# Patient Record
Sex: Male | Born: 1950
Health system: Southern US, Community
[De-identification: ages and names within clinical notes are randomized; demographics above are authoritative.]

## PROBLEM LIST (undated history)

## (undated) DIAGNOSIS — J309 Allergic rhinitis, unspecified: Secondary | ICD-10-CM

## (undated) DIAGNOSIS — C61 Malignant neoplasm of prostate: Secondary | ICD-10-CM

## (undated) HISTORY — DX: Allergic rhinitis, unspecified: J30.9

## (undated) HISTORY — PX: SHOULDER SURGERY: SHX246

## (undated) HISTORY — PX: PROSTATE BIOPSY: SHX241

---

## 2006-05-13 ENCOUNTER — Ambulatory Visit (HOSPITAL_COMMUNITY): Admission: RE | Admit: 2006-05-13 | Discharge: 2006-05-13 | Payer: Self-pay | Admitting: Orthopedic Surgery

## 2006-05-31 ENCOUNTER — Encounter: Admission: RE | Admit: 2006-05-31 | Discharge: 2006-05-31 | Payer: Self-pay | Admitting: Orthopedic Surgery

## 2009-07-19 ENCOUNTER — Emergency Department (HOSPITAL_COMMUNITY): Admission: EM | Admit: 2009-07-19 | Discharge: 2009-07-19 | Payer: Self-pay | Admitting: Emergency Medicine

## 2013-03-08 SURGERY — OSTECTOMY
Anesthesia: Choice | Laterality: Right

## 2015-04-29 DIAGNOSIS — E784 Other hyperlipidemia: Secondary | ICD-10-CM | POA: Diagnosis not present

## 2015-04-29 DIAGNOSIS — Z125 Encounter for screening for malignant neoplasm of prostate: Secondary | ICD-10-CM | POA: Diagnosis not present

## 2015-05-01 DIAGNOSIS — Z1212 Encounter for screening for malignant neoplasm of rectum: Secondary | ICD-10-CM | POA: Diagnosis not present

## 2015-05-05 DIAGNOSIS — Z0000 Encounter for general adult medical examination without abnormal findings: Secondary | ICD-10-CM | POA: Diagnosis not present

## 2015-05-05 DIAGNOSIS — Z6824 Body mass index (BMI) 24.0-24.9, adult: Secondary | ICD-10-CM | POA: Diagnosis not present

## 2015-05-05 DIAGNOSIS — E784 Other hyperlipidemia: Secondary | ICD-10-CM | POA: Diagnosis not present

## 2015-05-05 DIAGNOSIS — Z1389 Encounter for screening for other disorder: Secondary | ICD-10-CM | POA: Diagnosis not present

## 2015-05-05 DIAGNOSIS — J0100 Acute maxillary sinusitis, unspecified: Secondary | ICD-10-CM | POA: Diagnosis not present

## 2015-05-05 DIAGNOSIS — J302 Other seasonal allergic rhinitis: Secondary | ICD-10-CM | POA: Diagnosis not present

## 2015-08-22 DIAGNOSIS — H524 Presbyopia: Secondary | ICD-10-CM | POA: Diagnosis not present

## 2015-08-22 DIAGNOSIS — H2513 Age-related nuclear cataract, bilateral: Secondary | ICD-10-CM | POA: Diagnosis not present

## 2015-08-22 DIAGNOSIS — H04123 Dry eye syndrome of bilateral lacrimal glands: Secondary | ICD-10-CM | POA: Diagnosis not present

## 2015-08-22 DIAGNOSIS — D3131 Benign neoplasm of right choroid: Secondary | ICD-10-CM | POA: Diagnosis not present

## 2015-08-22 DIAGNOSIS — H52223 Regular astigmatism, bilateral: Secondary | ICD-10-CM | POA: Diagnosis not present

## 2016-05-03 DIAGNOSIS — Z0000 Encounter for general adult medical examination without abnormal findings: Secondary | ICD-10-CM | POA: Diagnosis not present

## 2016-05-03 DIAGNOSIS — Z125 Encounter for screening for malignant neoplasm of prostate: Secondary | ICD-10-CM | POA: Diagnosis not present

## 2016-05-03 DIAGNOSIS — E784 Other hyperlipidemia: Secondary | ICD-10-CM | POA: Diagnosis not present

## 2016-05-10 DIAGNOSIS — E663 Overweight: Secondary | ICD-10-CM | POA: Diagnosis not present

## 2016-05-10 DIAGNOSIS — E784 Other hyperlipidemia: Secondary | ICD-10-CM | POA: Diagnosis not present

## 2016-05-10 DIAGNOSIS — J302 Other seasonal allergic rhinitis: Secondary | ICD-10-CM | POA: Diagnosis not present

## 2016-05-10 DIAGNOSIS — Z23 Encounter for immunization: Secondary | ICD-10-CM | POA: Diagnosis not present

## 2016-05-10 DIAGNOSIS — Z0000 Encounter for general adult medical examination without abnormal findings: Secondary | ICD-10-CM | POA: Diagnosis not present

## 2016-05-10 DIAGNOSIS — Z6824 Body mass index (BMI) 24.0-24.9, adult: Secondary | ICD-10-CM | POA: Diagnosis not present

## 2016-05-10 DIAGNOSIS — Z1389 Encounter for screening for other disorder: Secondary | ICD-10-CM | POA: Diagnosis not present

## 2016-09-03 DIAGNOSIS — H52223 Regular astigmatism, bilateral: Secondary | ICD-10-CM | POA: Diagnosis not present

## 2016-09-03 DIAGNOSIS — H524 Presbyopia: Secondary | ICD-10-CM | POA: Diagnosis not present

## 2016-09-28 DIAGNOSIS — M25512 Pain in left shoulder: Secondary | ICD-10-CM | POA: Diagnosis not present

## 2016-09-28 DIAGNOSIS — M7541 Impingement syndrome of right shoulder: Secondary | ICD-10-CM | POA: Diagnosis not present

## 2016-09-29 DIAGNOSIS — M25511 Pain in right shoulder: Secondary | ICD-10-CM | POA: Diagnosis not present

## 2016-09-29 DIAGNOSIS — M25512 Pain in left shoulder: Secondary | ICD-10-CM | POA: Diagnosis not present

## 2016-10-04 DIAGNOSIS — M25512 Pain in left shoulder: Secondary | ICD-10-CM | POA: Diagnosis not present

## 2016-10-04 DIAGNOSIS — M25511 Pain in right shoulder: Secondary | ICD-10-CM | POA: Diagnosis not present

## 2016-10-14 DIAGNOSIS — M25512 Pain in left shoulder: Secondary | ICD-10-CM | POA: Diagnosis not present

## 2016-10-14 DIAGNOSIS — M25511 Pain in right shoulder: Secondary | ICD-10-CM | POA: Diagnosis not present

## 2017-05-11 DIAGNOSIS — Z125 Encounter for screening for malignant neoplasm of prostate: Secondary | ICD-10-CM | POA: Diagnosis not present

## 2017-05-11 DIAGNOSIS — R82998 Other abnormal findings in urine: Secondary | ICD-10-CM | POA: Diagnosis not present

## 2017-05-11 DIAGNOSIS — E7849 Other hyperlipidemia: Secondary | ICD-10-CM | POA: Diagnosis not present

## 2017-05-16 DIAGNOSIS — J302 Other seasonal allergic rhinitis: Secondary | ICD-10-CM | POA: Diagnosis not present

## 2017-05-16 DIAGNOSIS — Z0000 Encounter for general adult medical examination without abnormal findings: Secondary | ICD-10-CM | POA: Diagnosis not present

## 2017-05-16 DIAGNOSIS — E7849 Other hyperlipidemia: Secondary | ICD-10-CM | POA: Diagnosis not present

## 2017-05-16 DIAGNOSIS — Z1389 Encounter for screening for other disorder: Secondary | ICD-10-CM | POA: Diagnosis not present

## 2017-05-16 DIAGNOSIS — R918 Other nonspecific abnormal finding of lung field: Secondary | ICD-10-CM | POA: Diagnosis not present

## 2017-05-16 DIAGNOSIS — Z6824 Body mass index (BMI) 24.0-24.9, adult: Secondary | ICD-10-CM | POA: Diagnosis not present

## 2017-05-20 ENCOUNTER — Other Ambulatory Visit: Payer: Self-pay | Admitting: Internal Medicine

## 2017-05-20 DIAGNOSIS — R9389 Abnormal findings on diagnostic imaging of other specified body structures: Secondary | ICD-10-CM

## 2017-05-24 DIAGNOSIS — Z1212 Encounter for screening for malignant neoplasm of rectum: Secondary | ICD-10-CM | POA: Diagnosis not present

## 2017-06-02 ENCOUNTER — Ambulatory Visit
Admission: RE | Admit: 2017-06-02 | Discharge: 2017-06-02 | Disposition: A | Discharge status: Home / Self Care | Payer: PPO | Source: Ambulatory Visit | Attending: Internal Medicine | Admitting: Internal Medicine

## 2017-06-02 DIAGNOSIS — J849 Interstitial pulmonary disease, unspecified: Secondary | ICD-10-CM | POA: Diagnosis not present

## 2017-06-02 DIAGNOSIS — R9389 Abnormal findings on diagnostic imaging of other specified body structures: Secondary | ICD-10-CM

## 2017-06-02 MED ORDER — IOPAMIDOL (ISOVUE-300) INJECTION 61%
75.0000 mL | Freq: Once | INTRAVENOUS | Status: AC | PRN
  Administered 2017-06-02: 75 mL via INTRAVENOUS

## 2017-06-03 DIAGNOSIS — J020 Streptococcal pharyngitis: Secondary | ICD-10-CM | POA: Diagnosis not present

## 2017-06-03 DIAGNOSIS — Z6825 Body mass index (BMI) 25.0-25.9, adult: Secondary | ICD-10-CM | POA: Diagnosis not present

## 2017-06-03 DIAGNOSIS — J309 Allergic rhinitis, unspecified: Secondary | ICD-10-CM | POA: Diagnosis not present

## 2017-06-03 DIAGNOSIS — J029 Acute pharyngitis, unspecified: Secondary | ICD-10-CM | POA: Diagnosis not present

## 2017-06-23 DIAGNOSIS — N200 Calculus of kidney: Secondary | ICD-10-CM | POA: Diagnosis not present

## 2017-06-23 DIAGNOSIS — N281 Cyst of kidney, acquired: Secondary | ICD-10-CM | POA: Diagnosis not present

## 2017-08-01 ENCOUNTER — Ambulatory Visit: Payer: PPO | Admitting: Pulmonary Disease

## 2017-08-01 ENCOUNTER — Other Ambulatory Visit (INDEPENDENT_AMBULATORY_CARE_PROVIDER_SITE_OTHER): Payer: PPO

## 2017-08-01 ENCOUNTER — Encounter: Payer: Self-pay | Admitting: Pulmonary Disease

## 2017-08-01 VITALS — BP 130/70 | HR 73 | Ht 71.0 in | Wt 177.4 lb

## 2017-08-01 DIAGNOSIS — R0602 Shortness of breath: Secondary | ICD-10-CM

## 2017-08-01 LAB — CBC WITH DIFFERENTIAL/PLATELET
BASOS PCT: 0.4 % (ref 0.0–3.0)
Basophils Absolute: 0 10*3/uL (ref 0.0–0.1)
EOS ABS: 0.4 10*3/uL (ref 0.0–0.7)
Eosinophils Relative: 5.8 % — ABNORMAL HIGH (ref 0.0–5.0)
HCT: 41.5 % (ref 39.0–52.0)
Hemoglobin: 14 g/dL (ref 13.0–17.0)
LYMPHS ABS: 2.5 10*3/uL (ref 0.7–4.0)
Lymphocytes Relative: 40.3 % (ref 12.0–46.0)
MCHC: 33.8 g/dL (ref 30.0–36.0)
MCV: 91 fl (ref 78.0–100.0)
MONO ABS: 0.7 10*3/uL (ref 0.1–1.0)
Monocytes Relative: 11.9 % (ref 3.0–12.0)
NEUTROS ABS: 2.5 10*3/uL (ref 1.4–7.7)
NEUTROS PCT: 41.6 % — AB (ref 43.0–77.0)
PLATELETS: 174 10*3/uL (ref 150.0–400.0)
RBC: 4.56 Mil/uL (ref 4.22–5.81)
RDW: 13.6 % (ref 11.5–15.5)
WBC: 6.1 10*3/uL (ref 4.0–10.5)

## 2017-08-01 LAB — NITRIC OXIDE: Nitric Oxide: 42

## 2017-08-01 NOTE — Progress Notes (Signed)
Randall Hodges    [id number]    1950-07-18  Primary Care Physician:Aronson, Delfino Lovett, MD  Referring Physician: [name], MD [address] [place], [place] [address]  Chief complaint: Consult for abnormal CT scan  HPI: 67 year old with no significant past medical history Sent here for evaluation of chest x-ray and CT scan done at his primary care office which shows upper lobe predominant mild fibrosis, reticulation. Complains of wheezing, dyspnea on exertion for the past 5 months.  Reports seasonal allergies, sinus congestion, postnasal drip.  Has occasional heartburn symptoms.  He has had posterior occipital headaches for the past few months but reports that this is improving. Treated for streptococcal pharyngitis at his primary care office in February 2019.   Pets: No pets.  No exposure to birds, farm animals Occupation: Works as a Orthoptist Exposures: No known exposures, no hot tub, mold Smoking history: Never smoker Travel History: Travels around the state for work.  Lived in [place] all his life.  Outpatient Encounter Medications as of 08/01/2017  Medication Sig  . albuterol (PROAIR HFA) 108 (90 Base) MCG/ACT inhaler Inhale into the lungs.   No facility-administered encounter medications on file as of 08/01/2017.     Allergies as of 08/01/2017  . (Not on File)    No past medical history on file.  Past Surgical History:  Procedure Laterality Date  . SHOULDER SURGERY      No family history on file.  Social History   Socioeconomic History  . Marital status: Single    Spouse name: Not on file  . Number of children: Not on file  . Years of education: Not on file  . Highest education level: Not on file  Occupational History  . Not on file  Social Needs  . Financial resource strain: Not on file  . Food insecurity:    Worry: Not on file    Inability: Not on file  . Transportation needs:    Medical: Not on file    Non-medical:  Not on file  Tobacco Use  . Smoking status: Not on file  Substance and Sexual Activity  . Alcohol use: Not on file  . Drug use: Not on file  . Sexual activity: Not on file  Lifestyle  . Physical activity:    Days per week: Not on file    Minutes per session: Not on file  . Stress: Not on file  Relationships  . Social connections:    Talks on phone: Not on file    Gets together: Not on file    Attends religious service: Not on file    Active member of club or organization: Not on file    Attends meetings of clubs or organizations: Not on file    Relationship status: Not on file  . Intimate partner violence:    Fear of current or ex partner: Not on file    Emotionally abused: Not on file    Physically abused: Not on file    Forced sexual activity: Not on file  Other Topics Concern  . Not on file  Social History Narrative  . Not on file    Review of systems: Review of Systems  Constitutional: Negative for fever and chills.  HENT: Negative.   Eyes: Negative for blurred vision.  Respiratory: as per HPI  Cardiovascular: Negative for chest pain and palpitations.  Gastrointestinal: Negative for vomiting, diarrhea, blood per rectum. Genitourinary: Negative for dysuria, urgency, frequency  and hematuria.  Musculoskeletal: Negative for myalgias, back pain and joint pain.  Skin: Negative for itching and rash.  Neurological: Negative for dizziness, tremors, focal weakness, seizures and loss of consciousness.  Endo/Heme/Allergies: Negative for environmental allergies.  Psychiatric/Behavioral: Negative for depression, suicidal ideas and hallucinations.  All other systems reviewed and are negative.  Physical Exam: Blood pressure 130/70, pulse 73, height 5\' 11"  (1.803 m), weight 177 lb 6.4 oz (80.5 kg), SpO2 95 %. Gen:      No acute distress HEENT:  EOMI, sclera anicteric Neck:     No masses; no thyromegaly Lungs:    Clear to auscultation bilaterally; normal respiratory effort CV:          Regular rate and rhythm; no murmurs Abd:      + bowel sounds; soft, non-tender; no palpable masses, no distension Ext:    No edema; adequate peripheral perfusion Skin:      Warm and dry; no rash Neuro: alert and oriented x 3 Psych: normal mood and affect  Data Reviewed: CT chest 06/02/17-mild subpleural reticulation and groundglass predominantly in the upper lobe with traction bronchiectasis I reviewed the images personally  FENO 08/01/17-42  Assessment:  Abnormal CT, lung fibrosis CT scan reviewed with upper lobe predominant mild fibrosis of unclear etiology. There are no known exposures, signs and symptoms of connective tissue disease.  Will evaluate with pulmonary function test, serologies for ANA, rheumatoid factor, angiotensin-converting enzyme.  Dyspnea, wheezing Likely has reactive airway disease exacerbated by allergic rhinitis, postnasal drip Check CBC with differential, blood allergy profile.  Review PFTs Continue albuterol as needed.  Plan/Recommendations: - PFTs, blood test for connective tissue disease - CBC differential, blood allergy profile - Continue albuterol as needed  Marshell Garfinkel MD [facility] 08/01/2017, 3:27 PM  CC: [name], MD

## 2017-08-01 NOTE — Patient Instructions (Signed)
We will get some blood test today including CBC with differential, blood allergy profile, ANA with reflex, rheumatoid factor, CCP, angiotensin-converting enzyme Schedule for pulmonary function test.  Return to clinic after test for review. Start using the albuterol rescue inhaler as needed

## 2017-08-02 LAB — RESPIRATORY ALLERGY PROFILE REGION II ~~LOC~~
Allergen, Cedar tree, t12: <0.1 kU/L
Allergen, D pternoyssinus,d7: <0.1 kU/L
Allergen, Mouse Urine Protein, e78: <0.1 kU/L
Allergen, Mulberry, t76: <0.1 kU/L
Allergen, Oak,t7: <0.1 kU/L
CLADOSPORIUM HERBARUM (M2) IGE: <0.1 kU/L
CLASS: 0
CLASS: 0
CLASS: 0
CLASS: 0
CLASS: 0
CLASS: 0
CLASS: 0
CLASS: 0
CLASS: 0
CLASS: 0
CLASS: 0
COMMON RAGWEED (SHORT) (W1) IGE: <0.1 kU/L
Cat Dander: <0.1 kU/L
Class: 0
Class: 0
Class: 0
Class: 0
Class: 0
Class: 0
Class: 0
Class: 0
Class: 0
Class: 0
Class: 0
Class: 0
Class: 0
Cockroach: <0.1 kU/L
D. farinae: <0.1 kU/L
IgE (Immunoglobulin E), Serum: 5 kU/L (ref <=114)
Johnson Grass: <0.1 kU/L
Pecan/Hickory Tree IgE: <0.1 kU/L
Sheep Sorrel IgE: <0.1 kU/L

## 2017-08-02 LAB — INTERPRETATION:

## 2017-08-02 LAB — ANGIOTENSIN CONVERTING ENZYME: Angiotensin-Converting Enzyme: 49 U/L (ref 9–67)

## 2017-08-02 LAB — CYCLIC CITRUL PEPTIDE ANTIBODY, IGG

## 2017-08-02 LAB — ANA W/REFLEX: ANA: NEGATIVE

## 2017-08-02 LAB — RHEUMATOID FACTOR: Rhuematoid fact SerPl-aCnc: <14 IU/mL (ref <14)

## 2017-08-04 ENCOUNTER — Telehealth: Payer: Self-pay | Admitting: Pulmonary Disease

## 2017-08-04 ENCOUNTER — Ambulatory Visit (INDEPENDENT_AMBULATORY_CARE_PROVIDER_SITE_OTHER): Payer: PPO | Admitting: Pulmonary Disease

## 2017-08-04 DIAGNOSIS — R0602 Shortness of breath: Secondary | ICD-10-CM

## 2017-08-04 LAB — PULMONARY FUNCTION TEST
DL/VA % pred: 80 %
DL/VA: 3.77 ml/min/mmHg/L
DLCO unc % pred: 62 %
DLCO unc: 21.16 ml/min/mmHg
FEF 25-75 Post: 4.13 L/sec
FEF 25-75 Pre: 3.26 L/sec
FEF2575-%Change-Post: 26 %
FEF2575-%PRED-PRE: 121 %
FEF2575-%Pred-Post: 154 %
FEV1-%Change-Post: 3 %
FEV1-%PRED-POST: 93 %
FEV1-%Pred-Pre: 90 %
FEV1-POST: 3.23 L
FEV1-PRE: 3.11 L
FEV1FVC-%Change-Post: 3 %
FEV1FVC-%Pred-Pre: 110 %
FEV6-%CHANGE-POST: 0 %
FEV6-%PRED-PRE: 85 %
FEV6-%Pred-Post: 85 %
FEV6-POST: 3.8 L
FEV6-Pre: 3.81 L
FEV6FVC-%Change-Post: 0 %
FEV6FVC-%Pred-Post: 105 %
FEV6FVC-%Pred-Pre: 105 %
FVC-%Change-Post: 0 %
FVC-%Pred-Post: 81 %
FVC-%Pred-Pre: 81 %
FVC-POST: 3.81 L
FVC-Pre: 3.81 L
POST FEV6/FVC RATIO: 100 %
Post FEV1/FVC ratio: 85 %
Pre FEV1/FVC ratio: 82 %
Pre FEV6/FVC Ratio: 100 %
RV % pred: 88 %
RV: 2.18 L
TLC % PRED: 83 %
TLC: 6.02 L

## 2017-08-04 NOTE — Telephone Encounter (Signed)
We can hold off on 6 MW for now Let him know that PFTs show mild reduction in lung capacity and labs show elevation in eosinophils which may be from allergies. Will discuss in detail at clinic visit.  Marshell Garfinkel MD [facility] 08/04/2017, 4:08 PM

## 2017-08-04 NOTE — Telephone Encounter (Signed)
Spoke with patient and he states that on the bottom of his AVS from visit on 4.15 with Dr. Vaughan Browner it states for him to have  6 minute walk test. I looked at AVS from visit and did not see where that was stated. Only scheduled for PFT.   Dr. Vaughan Browner please advise if patient needs 6 minute walk test. Thanks.

## 2017-08-04 NOTE — Progress Notes (Signed)
PFT completed today.  

## 2017-08-04 NOTE — Telephone Encounter (Signed)
Called and spoke with pt letting him know the information stated by Dr. Vaughan Browner.  Pt expressed understanding. Nothing further needed at this time.

## 2017-08-15 ENCOUNTER — Ambulatory Visit: Payer: PPO | Admitting: Pulmonary Disease

## 2017-08-15 ENCOUNTER — Encounter: Payer: Self-pay | Admitting: Pulmonary Disease

## 2017-08-15 VITALS — BP 126/68 | HR 74 | Ht 71.0 in | Wt 177.8 lb

## 2017-08-15 DIAGNOSIS — R0602 Shortness of breath: Secondary | ICD-10-CM | POA: Diagnosis not present

## 2017-08-15 NOTE — Progress Notes (Signed)
Randall Hodges    [id number]    [birth year]  Primary Care Physician:Aronson, Delfino Lovett, MD  Referring Physician: [name], MD [address] [place], [place] [address]  Chief complaint: Follow-up for abnormal CT scan  HPI: 66 year old with no significant past medical history Sent here for evaluation of chest x-ray and CT scan done at his primary care office which shows upper lobe predominant mild fibrosis, reticulation. Complains of wheezing, dyspnea on exertion for the past 5 months.  Reports seasonal allergies, sinus congestion, postnasal drip.  Has occasional heartburn symptoms.  He has had posterior occipital headaches for the past few months but reports that this is improving. Treated for streptococcal pharyngitis at his primary care office in February 2019.   Pets: No pets.  No exposure to birds, farm animals Occupation: Works as a Orthoptist Exposures: No known exposures, no hot tub, mold Smoking history: Never smoker Travel History: Travels around the state for work.  Lived in [place] all his life.  Interim history: He continues to do well in terms of his breathing.  He has occasional wheezing.  He has not had a chance to use the albuterol inhaler yet.  Outpatient Encounter Medications as of 08/15/2017  Medication Sig  . albuterol (PROAIR HFA) 108 (90 Base) MCG/ACT inhaler Inhale into the lungs.  . fluticasone (FLONASE) 50 MCG/ACT nasal spray SPRAY 2 SPRAYS INTO EACH NOSTRIL EVERY DAY   No facility-administered encounter medications on file as of 08/15/2017.     Allergies as of 08/15/2017  . (Not on File)    Past Medical History:  Diagnosis Date  . Allergic rhinitis     Past Surgical History:  Procedure Laterality Date  . SHOULDER SURGERY      Family History  Problem Relation Age of Onset  . Diabetes Father   . Hypertension Paternal Grandmother     Social History   Socioeconomic History  . Marital status: Single   Spouse name: Not on file  . Number of children: Not on file  . Years of education: Not on file  . Highest education level: Not on file  Occupational History  . Not on file  Social Needs  . Financial resource strain: Not on file  . Food insecurity:    Worry: Not on file    Inability: Not on file  . Transportation needs:    Medical: Not on file    Non-medical: Not on file  Tobacco Use  . Smoking status: Never Smoker  . Smokeless tobacco: Never Used  Substance and Sexual Activity  . Alcohol use: Yes    Comment: occ  . Drug use: Never  . Sexual activity: Not on file  Lifestyle  . Physical activity:    Days per week: Not on file    Minutes per session: Not on file  . Stress: Not on file  Relationships  . Social connections:    Talks on phone: Not on file    Gets together: Not on file    Attends religious service: Not on file    Active member of club or organization: Not on file    Attends meetings of clubs or organizations: Not on file    Relationship status: Not on file  . Intimate partner violence:    Fear of current or ex partner: Not on file    Emotionally abused: Not on file    Physically abused: Not on file    Forced sexual activity: Not  on file  Other Topics Concern  . Not on file  Social History Narrative  . Not on file    Review of systems: Review of Systems  Constitutional: Negative for fever and chills.  HENT: Negative.   Eyes: Negative for blurred vision.  Respiratory: as per HPI  Cardiovascular: Negative for chest pain and palpitations.  Gastrointestinal: Negative for vomiting, diarrhea, blood per rectum. Genitourinary: Negative for dysuria, urgency, frequency and hematuria.  Musculoskeletal: Negative for myalgias, back pain and joint pain.  Skin: Negative for itching and rash.  Neurological: Negative for dizziness, tremors, focal weakness, seizures and loss of consciousness.  Endo/Heme/Allergies: Negative for environmental allergies.    Psychiatric/Behavioral: Negative for depression, suicidal ideas and hallucinations.  All other systems reviewed and are negative.  Physical Exam: Blood pressure 126/68, pulse 74, height 5\' 11"  (1.803 m), weight 177 lb 12.8 oz (80.6 kg), SpO2 97 %. Gen:      No acute distress HEENT:  EOMI, sclera anicteric Neck:     No masses; no thyromegaly Lungs:    Clear to auscultation bilaterally; normal respiratory effort CV:         Regular rate and rhythm; no murmurs Abd:      + bowel sounds; soft, non-tender; no palpable masses, no distension Ext:    No edema; adequate peripheral perfusion Skin:      Warm and dry; no rash Neuro: alert and oriented x 3 Psych: normal mood and affect  Data Reviewed: CT chest 06/02/17-mild subpleural reticulation and groundglass predominantly in the upper lobe with traction bronchiectasis I reviewed the images personally  FENO 08/01/17-42  PFTs 08/04/2017 FVC 3.81 [81%], FEV1 3.23 [93%], F/F 85, TLC 83%, DLCO 62%, DLCO/VA 80% Moderate diffusion defect that corrects for alveolar volume.  CBC 08/01/2017-WBC 6.1, eos 5.8%, absolute eosinophil count 400 Blood allergy profile 08/01/2017-IgE 5, RAST panel negative Connective tissue disease test 08/01/2017-ANA negative, ACE 49, CCP< 16, rheumatoid factor < 14  Assessment:  Abnormal CT, lung fibrosis CT scan reviewed with upper lobe predominant mild fibrosis of unclear etiology. There are no known exposures, signs and symptoms of connective tissue disease.   Since the changes are mild and he is asymptomatic we will continue to monitor this closely Defer lung biopsy unless there is progression. We will schedule him for a 6-minute walk test.  Continue albuterol as needed. Discussed this with patient today   Plan/Recommendations: - 6MW test - Continue albuterol as needed  Marshell Garfinkel MD [facility] 08/15/2017, 4:02 PM  CC: [name], MD

## 2017-08-15 NOTE — Addendum Note (Signed)
Addended by: Maryanna Shape A on: 08/15/2017 04:23 PM   Modules accepted: Orders

## 2017-08-15 NOTE — Patient Instructions (Signed)
We will schedule you for a 6-minute walk test Your lung function test showed slight reduction in lung function that could be from the scarring in the lung We will keep a close watch on this going forward We make a follow-up appointment in 6 months.  Please call us sooner if there is any change in your symptoms.

## 2017-08-16 ENCOUNTER — Ambulatory Visit (INDEPENDENT_AMBULATORY_CARE_PROVIDER_SITE_OTHER): Payer: PPO | Admitting: *Deleted

## 2017-08-16 ENCOUNTER — Ambulatory Visit: Payer: PPO

## 2017-08-16 DIAGNOSIS — R0602 Shortness of breath: Secondary | ICD-10-CM | POA: Diagnosis not present

## 2017-08-16 NOTE — Progress Notes (Signed)
SIX MIN WALK 08/16/2017  Medications none  Supplimental Oxygen during Test? (L/min) No  Laps 10  Partial Lap (in Meters) 21  Baseline BP (sitting) 114/66  Baseline Heartrate 67  Baseline Dyspnea (Borg Scale) 0.5  Baseline Fatigue (Borg Scale) 0  Baseline SPO2 99  BP (sitting) 142/76  Heartrate 76  Dyspnea (Borg Scale) 1  Fatigue (Borg Scale) 0.5  SPO2 98  BP (sitting) 124/68  Heartrate 65  SPO2 99  Stopped or Paused before Six Minutes No  Distance Completed 501  Tech Comments: patient tolerated walk well.

## 2017-09-09 DIAGNOSIS — H52223 Regular astigmatism, bilateral: Secondary | ICD-10-CM | POA: Diagnosis not present

## 2017-09-09 DIAGNOSIS — H524 Presbyopia: Secondary | ICD-10-CM | POA: Diagnosis not present

## 2017-12-28 DIAGNOSIS — M9902 Segmental and somatic dysfunction of thoracic region: Secondary | ICD-10-CM | POA: Diagnosis not present

## 2017-12-28 DIAGNOSIS — M546 Pain in thoracic spine: Secondary | ICD-10-CM | POA: Diagnosis not present

## 2018-01-02 DIAGNOSIS — M9902 Segmental and somatic dysfunction of thoracic region: Secondary | ICD-10-CM | POA: Diagnosis not present

## 2018-01-02 DIAGNOSIS — M546 Pain in thoracic spine: Secondary | ICD-10-CM | POA: Diagnosis not present

## 2018-01-04 DIAGNOSIS — M546 Pain in thoracic spine: Secondary | ICD-10-CM | POA: Diagnosis not present

## 2018-01-04 DIAGNOSIS — M9902 Segmental and somatic dysfunction of thoracic region: Secondary | ICD-10-CM | POA: Diagnosis not present

## 2018-01-12 DIAGNOSIS — M9902 Segmental and somatic dysfunction of thoracic region: Secondary | ICD-10-CM | POA: Diagnosis not present

## 2018-01-12 DIAGNOSIS — M546 Pain in thoracic spine: Secondary | ICD-10-CM | POA: Diagnosis not present

## 2018-01-16 DIAGNOSIS — M9902 Segmental and somatic dysfunction of thoracic region: Secondary | ICD-10-CM | POA: Diagnosis not present

## 2018-01-16 DIAGNOSIS — M546 Pain in thoracic spine: Secondary | ICD-10-CM | POA: Diagnosis not present

## 2018-01-19 DIAGNOSIS — M9902 Segmental and somatic dysfunction of thoracic region: Secondary | ICD-10-CM | POA: Diagnosis not present

## 2018-01-19 DIAGNOSIS — M546 Pain in thoracic spine: Secondary | ICD-10-CM | POA: Diagnosis not present

## 2018-01-23 DIAGNOSIS — M546 Pain in thoracic spine: Secondary | ICD-10-CM | POA: Diagnosis not present

## 2018-01-23 DIAGNOSIS — M9902 Segmental and somatic dysfunction of thoracic region: Secondary | ICD-10-CM | POA: Diagnosis not present

## 2018-01-25 DIAGNOSIS — M9902 Segmental and somatic dysfunction of thoracic region: Secondary | ICD-10-CM | POA: Diagnosis not present

## 2018-01-25 DIAGNOSIS — M546 Pain in thoracic spine: Secondary | ICD-10-CM | POA: Diagnosis not present

## 2018-01-26 DIAGNOSIS — M546 Pain in thoracic spine: Secondary | ICD-10-CM | POA: Diagnosis not present

## 2018-01-26 DIAGNOSIS — M9902 Segmental and somatic dysfunction of thoracic region: Secondary | ICD-10-CM | POA: Diagnosis not present

## 2018-02-01 DIAGNOSIS — M546 Pain in thoracic spine: Secondary | ICD-10-CM | POA: Diagnosis not present

## 2018-02-01 DIAGNOSIS — M9902 Segmental and somatic dysfunction of thoracic region: Secondary | ICD-10-CM | POA: Diagnosis not present

## 2018-02-06 DIAGNOSIS — M9902 Segmental and somatic dysfunction of thoracic region: Secondary | ICD-10-CM | POA: Diagnosis not present

## 2018-02-06 DIAGNOSIS — M546 Pain in thoracic spine: Secondary | ICD-10-CM | POA: Diagnosis not present

## 2018-02-13 DIAGNOSIS — L570 Actinic keratosis: Secondary | ICD-10-CM | POA: Diagnosis not present

## 2018-02-13 DIAGNOSIS — L578 Other skin changes due to chronic exposure to nonionizing radiation: Secondary | ICD-10-CM | POA: Diagnosis not present

## 2018-02-13 DIAGNOSIS — M9902 Segmental and somatic dysfunction of thoracic region: Secondary | ICD-10-CM | POA: Diagnosis not present

## 2018-02-13 DIAGNOSIS — L814 Other melanin hyperpigmentation: Secondary | ICD-10-CM | POA: Diagnosis not present

## 2018-02-13 DIAGNOSIS — M546 Pain in thoracic spine: Secondary | ICD-10-CM | POA: Diagnosis not present

## 2018-02-13 DIAGNOSIS — L821 Other seborrheic keratosis: Secondary | ICD-10-CM | POA: Diagnosis not present

## 2018-02-20 DIAGNOSIS — M9902 Segmental and somatic dysfunction of thoracic region: Secondary | ICD-10-CM | POA: Diagnosis not present

## 2018-02-20 DIAGNOSIS — M546 Pain in thoracic spine: Secondary | ICD-10-CM | POA: Diagnosis not present

## 2018-02-27 DIAGNOSIS — M9902 Segmental and somatic dysfunction of thoracic region: Secondary | ICD-10-CM | POA: Diagnosis not present

## 2018-02-27 DIAGNOSIS — M546 Pain in thoracic spine: Secondary | ICD-10-CM | POA: Diagnosis not present

## 2018-03-06 DIAGNOSIS — M9902 Segmental and somatic dysfunction of thoracic region: Secondary | ICD-10-CM | POA: Diagnosis not present

## 2018-03-06 DIAGNOSIS — M546 Pain in thoracic spine: Secondary | ICD-10-CM | POA: Diagnosis not present

## 2018-03-09 DIAGNOSIS — R1011 Right upper quadrant pain: Secondary | ICD-10-CM | POA: Diagnosis not present

## 2018-03-09 DIAGNOSIS — J8410 Pulmonary fibrosis, unspecified: Secondary | ICD-10-CM | POA: Diagnosis not present

## 2018-03-09 DIAGNOSIS — K219 Gastro-esophageal reflux disease without esophagitis: Secondary | ICD-10-CM | POA: Diagnosis not present

## 2018-03-09 DIAGNOSIS — Z6824 Body mass index (BMI) 24.0-24.9, adult: Secondary | ICD-10-CM | POA: Diagnosis not present

## 2018-03-10 DIAGNOSIS — R1011 Right upper quadrant pain: Secondary | ICD-10-CM | POA: Diagnosis not present

## 2018-03-13 DIAGNOSIS — M9902 Segmental and somatic dysfunction of thoracic region: Secondary | ICD-10-CM | POA: Diagnosis not present

## 2018-03-13 DIAGNOSIS — M546 Pain in thoracic spine: Secondary | ICD-10-CM | POA: Diagnosis not present

## 2018-03-20 DIAGNOSIS — M9902 Segmental and somatic dysfunction of thoracic region: Secondary | ICD-10-CM | POA: Diagnosis not present

## 2018-03-20 DIAGNOSIS — M546 Pain in thoracic spine: Secondary | ICD-10-CM | POA: Diagnosis not present

## 2018-03-29 DIAGNOSIS — M546 Pain in thoracic spine: Secondary | ICD-10-CM | POA: Diagnosis not present

## 2018-03-29 DIAGNOSIS — M9902 Segmental and somatic dysfunction of thoracic region: Secondary | ICD-10-CM | POA: Diagnosis not present

## 2018-04-03 DIAGNOSIS — M546 Pain in thoracic spine: Secondary | ICD-10-CM | POA: Diagnosis not present

## 2018-04-03 DIAGNOSIS — M9902 Segmental and somatic dysfunction of thoracic region: Secondary | ICD-10-CM | POA: Diagnosis not present

## 2018-04-04 DIAGNOSIS — L570 Actinic keratosis: Secondary | ICD-10-CM | POA: Diagnosis not present

## 2018-04-10 DIAGNOSIS — M9902 Segmental and somatic dysfunction of thoracic region: Secondary | ICD-10-CM | POA: Diagnosis not present

## 2018-04-10 DIAGNOSIS — M546 Pain in thoracic spine: Secondary | ICD-10-CM | POA: Diagnosis not present

## 2018-04-24 DIAGNOSIS — M9902 Segmental and somatic dysfunction of thoracic region: Secondary | ICD-10-CM | POA: Diagnosis not present

## 2018-04-24 DIAGNOSIS — M546 Pain in thoracic spine: Secondary | ICD-10-CM | POA: Diagnosis not present

## 2018-05-08 DIAGNOSIS — M546 Pain in thoracic spine: Secondary | ICD-10-CM | POA: Diagnosis not present

## 2018-05-08 DIAGNOSIS — M9902 Segmental and somatic dysfunction of thoracic region: Secondary | ICD-10-CM | POA: Diagnosis not present

## 2018-05-12 DIAGNOSIS — L570 Actinic keratosis: Secondary | ICD-10-CM | POA: Diagnosis not present

## 2018-05-15 DIAGNOSIS — Z125 Encounter for screening for malignant neoplasm of prostate: Secondary | ICD-10-CM | POA: Diagnosis not present

## 2018-05-15 DIAGNOSIS — R82998 Other abnormal findings in urine: Secondary | ICD-10-CM | POA: Diagnosis not present

## 2018-05-15 DIAGNOSIS — E7849 Other hyperlipidemia: Secondary | ICD-10-CM | POA: Diagnosis not present

## 2018-05-22 DIAGNOSIS — R972 Elevated prostate specific antigen [PSA]: Secondary | ICD-10-CM | POA: Diagnosis not present

## 2018-05-22 DIAGNOSIS — Z1331 Encounter for screening for depression: Secondary | ICD-10-CM | POA: Diagnosis not present

## 2018-05-22 DIAGNOSIS — Z23 Encounter for immunization: Secondary | ICD-10-CM | POA: Diagnosis not present

## 2018-05-22 DIAGNOSIS — J302 Other seasonal allergic rhinitis: Secondary | ICD-10-CM | POA: Diagnosis not present

## 2018-05-22 DIAGNOSIS — Z0000 Encounter for general adult medical examination without abnormal findings: Secondary | ICD-10-CM | POA: Diagnosis not present

## 2018-05-22 DIAGNOSIS — M9902 Segmental and somatic dysfunction of thoracic region: Secondary | ICD-10-CM | POA: Diagnosis not present

## 2018-05-22 DIAGNOSIS — J8410 Pulmonary fibrosis, unspecified: Secondary | ICD-10-CM | POA: Diagnosis not present

## 2018-05-22 DIAGNOSIS — E7849 Other hyperlipidemia: Secondary | ICD-10-CM | POA: Diagnosis not present

## 2018-05-22 DIAGNOSIS — M546 Pain in thoracic spine: Secondary | ICD-10-CM | POA: Diagnosis not present

## 2018-05-22 DIAGNOSIS — K219 Gastro-esophageal reflux disease without esophagitis: Secondary | ICD-10-CM | POA: Diagnosis not present

## 2018-05-26 DIAGNOSIS — Z1212 Encounter for screening for malignant neoplasm of rectum: Secondary | ICD-10-CM | POA: Diagnosis not present

## 2018-06-02 DIAGNOSIS — R972 Elevated prostate specific antigen [PSA]: Secondary | ICD-10-CM | POA: Diagnosis not present

## 2018-06-02 DIAGNOSIS — R3912 Poor urinary stream: Secondary | ICD-10-CM | POA: Diagnosis not present

## 2018-06-02 DIAGNOSIS — N401 Enlarged prostate with lower urinary tract symptoms: Secondary | ICD-10-CM | POA: Diagnosis not present

## 2018-06-02 DIAGNOSIS — N5201 Erectile dysfunction due to arterial insufficiency: Secondary | ICD-10-CM | POA: Diagnosis not present

## 2018-06-05 DIAGNOSIS — M546 Pain in thoracic spine: Secondary | ICD-10-CM | POA: Diagnosis not present

## 2018-06-05 DIAGNOSIS — M9902 Segmental and somatic dysfunction of thoracic region: Secondary | ICD-10-CM | POA: Diagnosis not present

## 2018-06-22 DIAGNOSIS — L570 Actinic keratosis: Secondary | ICD-10-CM | POA: Diagnosis not present

## 2018-07-13 DIAGNOSIS — R972 Elevated prostate specific antigen [PSA]: Secondary | ICD-10-CM | POA: Diagnosis not present

## 2018-07-21 DIAGNOSIS — M25511 Pain in right shoulder: Secondary | ICD-10-CM | POA: Diagnosis not present

## 2018-07-21 DIAGNOSIS — C61 Malignant neoplasm of prostate: Secondary | ICD-10-CM | POA: Diagnosis not present

## 2018-07-24 ENCOUNTER — Telehealth: Payer: Self-pay | Admitting: Radiation Oncology

## 2018-07-24 NOTE — Telephone Encounter (Signed)
New Message:     Lft vcmail for pt to call back to schedule an appt from referral received

## 2018-07-31 ENCOUNTER — Encounter: Payer: Self-pay | Admitting: Radiation Oncology

## 2018-07-31 NOTE — Progress Notes (Signed)
GU Location of Tumor / Histology: prostatic adenocarcinoma  If Prostate Cancer, Gleason Score is (4 + 3) and PSA is (4.84) on 05/15/2018.  [name] had an elevated PSA of 4.84 on 05/15/2018. PSA in 2019 was 2.6 and prior to that 3.0 in 2018.  Biopsies of prostate (if applicable) revealed:    Past/Anticipated interventions by urology, if any: prostate biopsy, referral for consideration of radiotherapy  Past/Anticipated interventions by medical oncology, if any: no  Weight changes, if any: no  Bowel/Bladder complaints, if any: IPSS 12. SHIM 16. Denies dysuria or hematuria. Denies urinary leakage or incontinence. Reports a weaker urine stream. Reports ED.   Nausea/Vomiting, if any: no  Pain issues, if any:  no  SAFETY ISSUES:  Prior radiation? no  Pacemaker/ICD? no  Possible current pregnancy? no, male patient  Is the patient on methotrexate? no  Current Complaints / other details:  68 year old male. Married with one son and one daughter. Works in [employer]. NKDA. Brother died of metastatic prostate ca.

## 2018-08-01 ENCOUNTER — Ambulatory Visit
Admission: RE | Admit: 2018-08-01 | Discharge: 2018-08-01 | Disposition: A | Discharge status: Home / Self Care | Payer: PPO | Source: Ambulatory Visit | Attending: Radiation Oncology | Admitting: Radiation Oncology

## 2018-08-01 ENCOUNTER — Other Ambulatory Visit: Payer: Self-pay

## 2018-08-01 ENCOUNTER — Encounter: Payer: Self-pay | Admitting: Radiation Oncology

## 2018-08-01 VITALS — Ht 71.0 in | Wt 175.0 lb

## 2018-08-01 DIAGNOSIS — Z8042 Family history of malignant neoplasm of prostate: Secondary | ICD-10-CM | POA: Diagnosis not present

## 2018-08-01 DIAGNOSIS — C61 Malignant neoplasm of prostate: Secondary | ICD-10-CM

## 2018-08-01 DIAGNOSIS — R972 Elevated prostate specific antigen [PSA]: Secondary | ICD-10-CM | POA: Diagnosis not present

## 2018-08-01 HISTORY — DX: Malignant neoplasm of prostate: C61

## 2018-08-01 NOTE — Progress Notes (Signed)
See progress notes under physician encounter. 

## 2018-08-01 NOTE — Progress Notes (Signed)
Radiation Oncology         [phone]) [phone] ________________________________  Initial Outpatient Consultation - Conducted via WebEx due to current COVID-19 concerns for limiting Randall exposure  Name: Randall Hodges: [id number]  Date: 08/01/2018  DOB: 1950-10-16  HQ:[id number], Randall Lovett, MD  Randall Mallow, MD   REFERRING PHYSICIAN: Lucas Mallow, MD  DIAGNOSIS: 68 y.o. gentleman with Stage T1c adenocarcinoma of the prostate with Gleason score of 4+3, and PSA of 4.68.    ICD-10-CM   1. Malignant neoplasm of prostate ([place]) C61     HISTORY OF PRESENT ILLNESS: Randall Hodges is a 68 y.o. male with a diagnosis of prostate cancer. He was noted to have an elevated PSA of 4.84 by his primary care physician, Dr. Reynaldo Minium.  Prior PSA was 2.6 in 2019 and 3.0 in 2018.  Accordingly, he was referred for evaluation in urology by Dr. Gloriann Loan on 06/02/2018,  digital rectal examination was performed at that time and was unremarkable.  A repeat PSA was performed on 06/02/2018 and remained elevated at 4.68.  The Randall proceeded to transrectal ultrasound with 12 biopsies of the prostate on 07/13/2018.  The prostate volume measured 27.87 cc.  Out of 12 core biopsies, 5 were positive.  The maximum Gleason score was 4+3, and this was seen in right base. Gleason 3+4 was seen in right apex, and Gleason 3+3 was seen in right mid lateral, right apex lateral, and left base.  The Randall reviewed the biopsy results with his urologist and he has kindly been referred today for discussion of potential radiation treatment options.  Of note, there is a family history of prostate cancer in his father as well as his brother who died of metastatic prostate cancer approximately 12 years ago, diagnosed in his 36s.  PREVIOUS RADIATION THERAPY: No  PAST MEDICAL HISTORY:  Past Medical History:  Diagnosis Date   Allergic rhinitis    Prostate cancer ([place])       PAST SURGICAL HISTORY: Past Surgical History:  Procedure  Laterality Date   PROSTATE BIOPSY     SHOULDER SURGERY      FAMILY HISTORY:  Family History  Problem Relation Age of Onset   Diabetes Father    Prostate cancer Father 68       had an enlarged prostate and had it removed   Hypertension Paternal Grandmother    Prostate cancer Brother        metastatic   Breast cancer Neg Hx    Colon cancer Neg Hx    Pancreatic cancer Neg Hx     SOCIAL HISTORY:  Social History   Socioeconomic History   Marital status: Married    Spouse name: Not on file   Number of children: 2   Years of education: Not on file   Highest education level: Not on file  Occupational History    Comment: full time   Social Designer, fashion/clothing strain: Not on file   Food insecurity:    Worry: Not on file    Inability: Not on file   Transportation needs:    Medical: Not on file    Non-medical: Not on file  Tobacco Use   Smoking status: Never Smoker   Smokeless tobacco: Never Used  Substance and Sexual Activity   Alcohol use: Yes    Comment: occ   Drug use: Never   Sexual activity: Not on file  Lifestyle   Physical activity:    Days per  week: Not on file    Minutes per session: Not on file   Stress: Not on file  Relationships   Social connections:    Talks on phone: Not on file    Gets together: Not on file    Attends religious service: Not on file    Active member of club or organization: Not on file    Attends meetings of clubs or organizations: Not on file    Relationship status: Not on file   Intimate partner violence:    Fear of current or ex partner: Not on file    Emotionally abused: Not on file    Physically abused: Not on file    Forced sexual activity: Not on file  Other Topics Concern   Not on file  Social History Narrative   Not on file    ALLERGIES: Randall has no known allergies.  MEDICATIONS:  Current Outpatient Medications  Medication Sig Dispense Refill   Multiple Vitamin (MULTIVITAMIN)  capsule Take 1 capsule by mouth daily.     omeprazole (PRILOSEC) 40 MG capsule      No current facility-administered medications for this encounter.     REVIEW OF SYSTEMS:  On review of systems, the Randall reports that he is doing well overall. He denies any chest pain, shortness of breath, cough, fevers, chills, night sweats, unintended weight changes. He denies any bowel disturbances, and denies abdominal pain, nausea or vomiting. He denies any new musculoskeletal or joint aches or pains. His IPSS was 12, indicating moderate urinary symptoms with a weak urine stream, intermittency, urgency and nocturia x2.  He denies dysuria, gross hematuria, urinary leakage or incontinence. His SHIM was 16, indicating he moderate erectile dysfunction. A complete review of systems is obtained and is otherwise negative.    PHYSICAL EXAM:  Wt Readings from Last 3 Encounters:  08/01/18 175 lb (79.4 kg)  08/15/17 177 lb 12.8 oz (80.6 kg)  08/01/17 177 lb 6.4 oz (80.5 kg)   Temp Readings from Last 3 Encounters:  No data found for Temp   BP Readings from Last 3 Encounters:  08/15/17 126/68  08/01/17 130/70   Pulse Readings from Last 3 Encounters:  08/15/17 74  08/01/17 73   Pain Assessment Pain Score: 0-No pain/10  In general this is a well appearing caucasian male in no acute distress. He's alert and oriented x4 and appropriate throughout the examination. Cardiopulmonary assessment is negative for acute distress and he exhibits normal effort.   KPS = 90  100 - Normal; no complaints; no evidence of disease. 90   - Able to carry on normal activity; minor signs or symptoms of disease. 80   - Normal activity with effort; some signs or symptoms of disease. 54   - Cares for self; unable to carry on normal activity or to do active work. 60   - Requires occasional assistance, but is able to care for most of his personal needs. 50   - Requires considerable assistance and frequent medical care. 70   -  Disabled; requires special care and assistance. 2   - Severely disabled; hospital admission is indicated although death not imminent. 33   - Very sick; hospital admission necessary; active supportive treatment necessary. 10   - Moribund; fatal processes progressing rapidly. 0     - Dead  Karnofsky DA, Abelmann WH, Craver LS and Burchenal [facility] [phone]) The use of the nitrogen mustards in the palliative treatment of carcinoma: with particular reference to bronchogenic carcinoma Cancer  1 [phone]  LABORATORY DATA:  Lab Results  Component Value Date   WBC 6.1 08/01/2017   HGB 14.0 08/01/2017   HCT 41.5 08/01/2017   MCV 91.0 08/01/2017   PLT 174.0 08/01/2017   No results found for: NA, K, CL, CO2 No results found for: ALT, AST, GGT, ALKPHOS, BILITOT   RADIOGRAPHY: No results found.    IMPRESSION/PLAN: 1. 68 y.o. gentleman with Stage T1c adenocarcinoma of the prostate with Gleason Score of 4+3, and PSA of 4.68. This visit was conducted via WebEx to spare the Randall unnecessary potential exposure in the healthcare setting during the current COVID-19 pandemic.  We discussed the Randall's workup and outlined the nature of prostate cancer in this setting. The Randall's T stage, Gleason's score, and PSA put him into the unfavorable intermediate risk group. Accordingly, he is eligible for a variety of potential treatment options including brachytherapy, 5.5 weeks of external radiation, or prostatectomy. We discussed the available radiation techniques, and focused on the details and logistics and delivery. We discussed and outlined the risks, benefits, short and long-term effects associated with radiotherapy and compared and contrasted these with prostatectomy. We discussed the role of SpaceOAR in reducing the rectal toxicity associated with radiotherapy. We also discussed the potential utility of ST-ADT to protect the Randall from progression of disease given the likelihood of delay of treatment due to  current scheduling delays related to COVID-19 and associated OP surgery restrictions. After discussing the potential side affects associated with ADT, the Randall prefers to avoid this unless it is felt absolutely necessary. While we do not feel it is imperative, it is an option and can be discussed further with Dr. Gloriann Loan pending any substantial delays in scheduling his procedure beyond 2-3 months.  At the end of the conversation the Randall is interested in moving forward with brachytherapy and use of SpaceOAR to reduce rectal toxicity from radiotherapy.  We will share our discussion with Dr. Gloriann Loan and move forward with scheduling his CT [facility] planning appointment, anticipated in June 2020 pending progress with COVID-19 containment and date of his procedure.  We will also recommend a repeat PSA be performed in addition to the standard pre-op labs prior to his procedure to establish a baseline since his last PSA was 05/2018 and treatment will not likely occur until at least 09/2018. The Randall will be in touch with Romie Jumper, who will be working closely with him to coordinate OR scheduling and pre and post procedure appointments, in the near future.  We will contact the pharmaceutical rep to ensure that [place] is available at the time of procedure.  He will have a prostate MRI following his post-seed CT SIM to confirm appropriate distribution of the [place].  Given current concerns for Randall exposure during the COVID-19 pandemic, this encounter was conducted via WebEx. The Randall was notified ahead of time and has given verbal consent for this type of encounter. The time spent during this encounter was 80 minutes. The attendants for this meeting include Tyler Pita MD, Ashlyn Bruning PA-C, [place], Randall Hodges and his wife. During the encounter, Tyler Pita MD, Ashlyn Bruning PA-C, and scribe, Wilburn Mylar were located at [place].  Randall Dyllon B. Kincer  and his wife were located at home.    Nicholos Johns, PA-C    Tyler Pita, MD  [facility] Direct Dial: [phone]   Fax: [phone] [email or web address]   Skype  [email or web address]   This document serves as a record of services personally performed by Tyler Pita, MD and Freeman Caldron, PA-C. It was created on their behalf by Wilburn Mylar, a trained medical scribe. The creation of this record is based on the scribe's personal observations and the provider's statements to them. This document has been checked and approved by the attending provider.

## 2018-08-02 DIAGNOSIS — C61 Malignant neoplasm of prostate: Secondary | ICD-10-CM | POA: Insufficient documentation

## 2018-08-07 ENCOUNTER — Other Ambulatory Visit: Payer: Self-pay | Admitting: Urology

## 2018-08-08 ENCOUNTER — Encounter: Payer: Self-pay | Admitting: Medical Oncology

## 2018-08-08 ENCOUNTER — Telehealth: Payer: Self-pay | Admitting: Medical Oncology

## 2018-08-08 NOTE — Telephone Encounter (Signed)
Spoke with Randall Hodges to introduce myself as the prostate nurse navigator and my role. I was unable to meet him 4/14 due to consult being conducted via WebEx due to current COVID-19 concerns for limiting patient exposure. He states the consult went very well and is leaning towards brachytherapy but has not made his mind up 100%. I informed him that he has been scheduled for seed implant 6/29 with CT simulation 5/28. He did receive a message from [place] but has not returned her call. He will call her this afternoon to discuss these dates. I informed him that I am working remotely and I ask if he has questions or concerns to leave me a voicemail and I will be happy to return his call. He voiced understanding and thanked me for the follow up call.

## 2018-08-11 ENCOUNTER — Telehealth: Payer: Self-pay | Admitting: *Deleted

## 2018-08-11 NOTE — Telephone Encounter (Signed)
CALLED PATIENT TO INFORM OF PRE-SEED PLANNING CT AND IMPLANT, LVM FOR A RETURN CALL

## 2018-08-16 DIAGNOSIS — R972 Elevated prostate specific antigen [PSA]: Secondary | ICD-10-CM | POA: Diagnosis not present

## 2018-08-16 DIAGNOSIS — D400 Neoplasm of uncertain behavior of prostate: Secondary | ICD-10-CM | POA: Diagnosis not present

## 2018-08-16 DIAGNOSIS — N5201 Erectile dysfunction due to arterial insufficiency: Secondary | ICD-10-CM | POA: Diagnosis not present

## 2018-08-16 DIAGNOSIS — C61 Malignant neoplasm of prostate: Secondary | ICD-10-CM | POA: Diagnosis not present

## 2018-08-17 DIAGNOSIS — C61 Malignant neoplasm of prostate: Secondary | ICD-10-CM | POA: Diagnosis not present

## 2018-08-21 DIAGNOSIS — D400 Neoplasm of uncertain behavior of prostate: Secondary | ICD-10-CM | POA: Diagnosis not present

## 2018-08-21 DIAGNOSIS — L570 Actinic keratosis: Secondary | ICD-10-CM | POA: Diagnosis not present

## 2018-08-21 DIAGNOSIS — E785 Hyperlipidemia, unspecified: Secondary | ICD-10-CM | POA: Diagnosis not present

## 2018-08-21 DIAGNOSIS — L821 Other seborrheic keratosis: Secondary | ICD-10-CM | POA: Diagnosis not present

## 2018-08-21 DIAGNOSIS — C61 Malignant neoplasm of prostate: Secondary | ICD-10-CM | POA: Diagnosis not present

## 2018-08-21 DIAGNOSIS — J8410 Pulmonary fibrosis, unspecified: Secondary | ICD-10-CM | POA: Diagnosis not present

## 2018-08-21 DIAGNOSIS — N5201 Erectile dysfunction due to arterial insufficiency: Secondary | ICD-10-CM | POA: Diagnosis not present

## 2018-08-21 DIAGNOSIS — K219 Gastro-esophageal reflux disease without esophagitis: Secondary | ICD-10-CM | POA: Diagnosis not present

## 2018-08-21 DIAGNOSIS — J302 Other seasonal allergic rhinitis: Secondary | ICD-10-CM | POA: Diagnosis not present

## 2018-08-22 ENCOUNTER — Other Ambulatory Visit: Payer: Self-pay | Admitting: Urology

## 2018-08-22 DIAGNOSIS — C61 Malignant neoplasm of prostate: Secondary | ICD-10-CM

## 2018-08-25 DIAGNOSIS — C61 Malignant neoplasm of prostate: Secondary | ICD-10-CM | POA: Diagnosis not present

## 2018-08-31 ENCOUNTER — Encounter: Payer: Self-pay | Admitting: Urology

## 2018-09-06 DIAGNOSIS — D400 Neoplasm of uncertain behavior of prostate: Secondary | ICD-10-CM | POA: Diagnosis not present

## 2018-09-06 DIAGNOSIS — R972 Elevated prostate specific antigen [PSA]: Secondary | ICD-10-CM | POA: Diagnosis not present

## 2018-09-06 DIAGNOSIS — N5201 Erectile dysfunction due to arterial insufficiency: Secondary | ICD-10-CM | POA: Diagnosis not present

## 2018-09-06 DIAGNOSIS — C61 Malignant neoplasm of prostate: Secondary | ICD-10-CM | POA: Diagnosis not present

## 2018-09-06 DIAGNOSIS — R300 Dysuria: Secondary | ICD-10-CM | POA: Diagnosis not present

## 2018-09-07 ENCOUNTER — Other Ambulatory Visit: Payer: Self-pay | Admitting: Urology

## 2018-09-07 NOTE — Progress Notes (Signed)
This patient called Randall Hodges and cancelled his implant which had been scheduled for June 29, reporting that he has decided to go to [place] with another group and have a different procedure.  Nicholos Johns, MMS, PA-C [facility] [place]: [phone]  Fax: [phone]

## 2018-09-12 DIAGNOSIS — N401 Enlarged prostate with lower urinary tract symptoms: Secondary | ICD-10-CM | POA: Diagnosis not present

## 2018-09-14 ENCOUNTER — Ambulatory Visit: Payer: Self-pay | Admitting: Urology

## 2018-09-14 ENCOUNTER — Ambulatory Visit: Admission: RE | Admit: 2018-09-14 | Payer: PPO | Source: Ambulatory Visit | Admitting: Radiation Oncology

## 2018-10-12 DIAGNOSIS — L814 Other melanin hyperpigmentation: Secondary | ICD-10-CM | POA: Diagnosis not present

## 2018-10-12 DIAGNOSIS — D225 Melanocytic nevi of trunk: Secondary | ICD-10-CM | POA: Diagnosis not present

## 2018-10-12 DIAGNOSIS — L820 Inflamed seborrheic keratosis: Secondary | ICD-10-CM | POA: Diagnosis not present

## 2018-10-12 DIAGNOSIS — D1801 Hemangioma of skin and subcutaneous tissue: Secondary | ICD-10-CM | POA: Diagnosis not present

## 2018-10-12 DIAGNOSIS — L821 Other seborrheic keratosis: Secondary | ICD-10-CM | POA: Diagnosis not present

## 2018-10-12 DIAGNOSIS — L570 Actinic keratosis: Secondary | ICD-10-CM | POA: Diagnosis not present

## 2018-10-16 ENCOUNTER — Encounter (HOSPITAL_BASED_OUTPATIENT_CLINIC_OR_DEPARTMENT_OTHER): Payer: Self-pay

## 2018-10-16 ENCOUNTER — Ambulatory Visit (HOSPITAL_BASED_OUTPATIENT_CLINIC_OR_DEPARTMENT_OTHER): Admit: 2018-10-16 | Payer: PPO | Admitting: Urology

## 2018-10-16 SURGERY — INSERTION, RADIATION SOURCE, PROSTATE
Anesthesia: General

## 2018-11-30 DIAGNOSIS — L570 Actinic keratosis: Secondary | ICD-10-CM | POA: Diagnosis not present

## 2018-12-04 DIAGNOSIS — C61 Malignant neoplasm of prostate: Secondary | ICD-10-CM | POA: Diagnosis not present

## 2018-12-04 DIAGNOSIS — D400 Neoplasm of uncertain behavior of prostate: Secondary | ICD-10-CM | POA: Diagnosis not present

## 2018-12-04 DIAGNOSIS — N5201 Erectile dysfunction due to arterial insufficiency: Secondary | ICD-10-CM | POA: Diagnosis not present

## 2018-12-04 DIAGNOSIS — R972 Elevated prostate specific antigen [PSA]: Secondary | ICD-10-CM | POA: Diagnosis not present

## 2019-01-31 DIAGNOSIS — N5201 Erectile dysfunction due to arterial insufficiency: Secondary | ICD-10-CM | POA: Diagnosis not present

## 2019-01-31 DIAGNOSIS — C61 Malignant neoplasm of prostate: Secondary | ICD-10-CM | POA: Diagnosis not present

## 2019-02-06 DIAGNOSIS — L570 Actinic keratosis: Secondary | ICD-10-CM | POA: Diagnosis not present

## 2019-02-06 DIAGNOSIS — Z23 Encounter for immunization: Secondary | ICD-10-CM | POA: Diagnosis not present

## 2019-03-05 DIAGNOSIS — C61 Malignant neoplasm of prostate: Secondary | ICD-10-CM | POA: Diagnosis not present

## 2019-03-05 DIAGNOSIS — D400 Neoplasm of uncertain behavior of prostate: Secondary | ICD-10-CM | POA: Diagnosis not present

## 2019-03-05 DIAGNOSIS — N5201 Erectile dysfunction due to arterial insufficiency: Secondary | ICD-10-CM | POA: Diagnosis not present

## 2019-05-21 DIAGNOSIS — E7849 Other hyperlipidemia: Secondary | ICD-10-CM | POA: Diagnosis not present

## 2019-05-21 DIAGNOSIS — Z125 Encounter for screening for malignant neoplasm of prostate: Secondary | ICD-10-CM | POA: Diagnosis not present

## 2019-05-22 DIAGNOSIS — R82998 Other abnormal findings in urine: Secondary | ICD-10-CM | POA: Diagnosis not present

## 2019-05-28 DIAGNOSIS — C61 Malignant neoplasm of prostate: Secondary | ICD-10-CM | POA: Diagnosis not present

## 2019-05-28 DIAGNOSIS — E785 Hyperlipidemia, unspecified: Secondary | ICD-10-CM | POA: Diagnosis not present

## 2019-05-28 DIAGNOSIS — J8410 Pulmonary fibrosis, unspecified: Secondary | ICD-10-CM | POA: Diagnosis not present

## 2019-05-28 DIAGNOSIS — Z1331 Encounter for screening for depression: Secondary | ICD-10-CM | POA: Diagnosis not present

## 2019-05-28 DIAGNOSIS — D400 Neoplasm of uncertain behavior of prostate: Secondary | ICD-10-CM | POA: Diagnosis not present

## 2019-05-28 DIAGNOSIS — J302 Other seasonal allergic rhinitis: Secondary | ICD-10-CM | POA: Diagnosis not present

## 2019-05-28 DIAGNOSIS — Z0000 Encounter for general adult medical examination without abnormal findings: Secondary | ICD-10-CM | POA: Diagnosis not present

## 2019-05-28 DIAGNOSIS — Z1339 Encounter for screening examination for other mental health and behavioral disorders: Secondary | ICD-10-CM | POA: Diagnosis not present

## 2019-06-04 DIAGNOSIS — D400 Neoplasm of uncertain behavior of prostate: Secondary | ICD-10-CM | POA: Diagnosis not present

## 2019-06-04 DIAGNOSIS — N5201 Erectile dysfunction due to arterial insufficiency: Secondary | ICD-10-CM | POA: Diagnosis not present

## 2019-06-04 DIAGNOSIS — C61 Malignant neoplasm of prostate: Secondary | ICD-10-CM | POA: Diagnosis not present

## 2019-06-14 DIAGNOSIS — L300 Nummular dermatitis: Secondary | ICD-10-CM | POA: Diagnosis not present

## 2019-06-14 DIAGNOSIS — L578 Other skin changes due to chronic exposure to nonionizing radiation: Secondary | ICD-10-CM | POA: Diagnosis not present

## 2019-06-14 DIAGNOSIS — L570 Actinic keratosis: Secondary | ICD-10-CM | POA: Diagnosis not present

## 2019-08-27 DIAGNOSIS — N5201 Erectile dysfunction due to arterial insufficiency: Secondary | ICD-10-CM | POA: Diagnosis not present

## 2019-08-27 DIAGNOSIS — C61 Malignant neoplasm of prostate: Secondary | ICD-10-CM | POA: Diagnosis not present

## 2019-08-27 DIAGNOSIS — D400 Neoplasm of uncertain behavior of prostate: Secondary | ICD-10-CM | POA: Diagnosis not present

## 2019-11-19 DIAGNOSIS — D225 Melanocytic nevi of trunk: Secondary | ICD-10-CM | POA: Diagnosis not present

## 2019-11-19 DIAGNOSIS — L570 Actinic keratosis: Secondary | ICD-10-CM | POA: Diagnosis not present

## 2019-11-19 DIAGNOSIS — L579 Skin changes due to chronic exposure to nonionizing radiation, unspecified: Secondary | ICD-10-CM | POA: Diagnosis not present

## 2019-11-19 DIAGNOSIS — L821 Other seborrheic keratosis: Secondary | ICD-10-CM | POA: Diagnosis not present

## 2019-11-19 DIAGNOSIS — L814 Other melanin hyperpigmentation: Secondary | ICD-10-CM | POA: Diagnosis not present

## 2019-11-19 DIAGNOSIS — D1801 Hemangioma of skin and subcutaneous tissue: Secondary | ICD-10-CM | POA: Diagnosis not present

## 2019-12-05 DIAGNOSIS — E785 Hyperlipidemia, unspecified: Secondary | ICD-10-CM | POA: Diagnosis not present

## 2019-12-05 DIAGNOSIS — K219 Gastro-esophageal reflux disease without esophagitis: Secondary | ICD-10-CM | POA: Diagnosis not present

## 2019-12-05 DIAGNOSIS — J8410 Pulmonary fibrosis, unspecified: Secondary | ICD-10-CM | POA: Diagnosis not present

## 2019-12-05 DIAGNOSIS — C61 Malignant neoplasm of prostate: Secondary | ICD-10-CM | POA: Diagnosis not present

## 2020-02-27 DIAGNOSIS — C61 Malignant neoplasm of prostate: Secondary | ICD-10-CM | POA: Diagnosis not present

## 2020-02-27 DIAGNOSIS — N5201 Erectile dysfunction due to arterial insufficiency: Secondary | ICD-10-CM | POA: Diagnosis not present

## 2020-02-27 DIAGNOSIS — D400 Neoplasm of uncertain behavior of prostate: Secondary | ICD-10-CM | POA: Diagnosis not present

## 2020-03-03 DIAGNOSIS — L821 Other seborrheic keratosis: Secondary | ICD-10-CM | POA: Diagnosis not present

## 2020-03-03 DIAGNOSIS — L570 Actinic keratosis: Secondary | ICD-10-CM | POA: Diagnosis not present

## 2020-03-03 DIAGNOSIS — L814 Other melanin hyperpigmentation: Secondary | ICD-10-CM | POA: Diagnosis not present

## 2020-03-03 DIAGNOSIS — L578 Other skin changes due to chronic exposure to nonionizing radiation: Secondary | ICD-10-CM | POA: Diagnosis not present

## 2020-03-26 DIAGNOSIS — S025XXA Fracture of tooth (traumatic), initial encounter for closed fracture: Secondary | ICD-10-CM | POA: Diagnosis not present

## 2020-07-09 DIAGNOSIS — E785 Hyperlipidemia, unspecified: Secondary | ICD-10-CM | POA: Diagnosis not present

## 2020-07-09 DIAGNOSIS — Z125 Encounter for screening for malignant neoplasm of prostate: Secondary | ICD-10-CM | POA: Diagnosis not present

## 2020-07-16 DIAGNOSIS — J309 Allergic rhinitis, unspecified: Secondary | ICD-10-CM | POA: Diagnosis not present

## 2020-07-16 DIAGNOSIS — E785 Hyperlipidemia, unspecified: Secondary | ICD-10-CM | POA: Diagnosis not present

## 2020-07-16 DIAGNOSIS — Z1331 Encounter for screening for depression: Secondary | ICD-10-CM | POA: Diagnosis not present

## 2020-07-16 DIAGNOSIS — J8410 Pulmonary fibrosis, unspecified: Secondary | ICD-10-CM | POA: Diagnosis not present

## 2020-07-16 DIAGNOSIS — R972 Elevated prostate specific antigen [PSA]: Secondary | ICD-10-CM | POA: Diagnosis not present

## 2020-07-16 DIAGNOSIS — R82998 Other abnormal findings in urine: Secondary | ICD-10-CM | POA: Diagnosis not present

## 2020-07-16 DIAGNOSIS — Z1339 Encounter for screening examination for other mental health and behavioral disorders: Secondary | ICD-10-CM | POA: Diagnosis not present

## 2020-07-16 DIAGNOSIS — Z0000 Encounter for general adult medical examination without abnormal findings: Secondary | ICD-10-CM | POA: Diagnosis not present

## 2020-07-16 DIAGNOSIS — R002 Palpitations: Secondary | ICD-10-CM | POA: Diagnosis not present

## 2020-07-16 DIAGNOSIS — K219 Gastro-esophageal reflux disease without esophagitis: Secondary | ICD-10-CM | POA: Diagnosis not present

## 2020-07-17 DIAGNOSIS — Z1212 Encounter for screening for malignant neoplasm of rectum: Secondary | ICD-10-CM | POA: Diagnosis not present

## 2020-07-18 ENCOUNTER — Ambulatory Visit: Payer: PPO | Admitting: Internal Medicine

## 2020-07-18 ENCOUNTER — Encounter: Payer: Self-pay | Admitting: Internal Medicine

## 2020-07-18 ENCOUNTER — Other Ambulatory Visit: Payer: Self-pay

## 2020-07-18 VITALS — BP 148/68 | HR 80 | Ht 71.0 in | Wt 172.2 lb

## 2020-07-18 DIAGNOSIS — R002 Palpitations: Secondary | ICD-10-CM | POA: Diagnosis not present

## 2020-07-18 DIAGNOSIS — R030 Elevated blood-pressure reading, without diagnosis of hypertension: Secondary | ICD-10-CM

## 2020-07-18 DIAGNOSIS — I493 Ventricular premature depolarization: Secondary | ICD-10-CM | POA: Diagnosis not present

## 2020-07-18 DIAGNOSIS — E7800 Pure hypercholesterolemia, unspecified: Secondary | ICD-10-CM

## 2020-07-18 DIAGNOSIS — I2584 Coronary atherosclerosis due to calcified coronary lesion: Secondary | ICD-10-CM | POA: Diagnosis not present

## 2020-07-18 DIAGNOSIS — Z79899 Other long term (current) drug therapy: Secondary | ICD-10-CM | POA: Diagnosis not present

## 2020-07-18 DIAGNOSIS — I2510 Atherosclerotic heart disease of native coronary artery without angina pectoris: Secondary | ICD-10-CM | POA: Diagnosis not present

## 2020-07-18 DIAGNOSIS — C61 Malignant neoplasm of prostate: Secondary | ICD-10-CM

## 2020-07-18 MED ORDER — ASPIRIN EC 81 MG PO TBEC: 81.0000 mg | DELAYED_RELEASE_TABLET | Freq: Every day | ORAL | 0 refills | Status: AC

## 2020-07-18 MED ORDER — ATORVASTATIN CALCIUM 40 MG PO TABS: 40.0000 mg | ORAL_TABLET | Freq: Every day | ORAL | 3 refills | Status: DC

## 2020-07-18 NOTE — Progress Notes (Addendum)
Cardiology Office Note:    Date:  07/18/2020   ID:  Randall Hodges, DOB [birth year], MRN [id number]  PCP:  [name], MD  Cardiologist:  No primary care provider on file.  Electrophysiologist:  None   Referring MD: [name], MD   Chief Complaint/Reason for Referral: "PVCs with dramatic pause"  History of Present Illness:    Randall Hodges is a 70 y.o. male with a history of arthritis, kidney stones, and no significant past cardiovascular history to date who presents with PVCs on ECG and worsening palpitations.  He also has a strong family history of coronary artery disease and very severe coronary artery calcifications on CT chest from 2019.  He notes that 3 to 4 months ago he began to have palpitations he would feel a few beats in a row then a short pause then a large thump in his chest.  Over the past 2 months this has been worsening and he has daily symptoms.  He notes an irregularity to his heart rhythm.  He drinks 2 to 4 cups of coffee daily.  He feels he has enjoyed excellent health up until this point and he takes no prescription medications  His brother had coronary artery disease with stents and another brother with arrhythmia and eventually also stents.  His father side of the family had heart disease.  Randall Hodges also notes bilateral arm numbness starting from the shoulders down it will wake him up from sleep around 3:30 AM reliably with arms and hands tingling.  He also feels that his feet are often quite cold more recently.  No definite diagnosis of carpal tunnel, he has had several left shoulder surgeries per his report.  The patient denies chest pain, chest pressure, dyspnea at rest or with exertion, PND, orthopnea, or leg swelling. Denies cough, fever, chills. Denies nausea, vomiting. Denies syncope or presyncope. Denies dizziness or lightheadedness. Denies snoring.  I reviewed the ECG prompting consultation obtained at his primary care doctor's office.  This  ECG demonstrates sinus rhythm with frequent multifocal PVCs.  There is a description of a pause from the handwritten interpretation, however I suspect this was felt to be in lead aVL and there is a low voltage QRS complex in lead aVL that is sinus in origin.  I do not see a pause on the referral ECG.  There are some beats with an ectopic atrial focus, this could represent ectopic atrial beats.  No pause on ECG today in our office.   Past Medical History:  Diagnosis Date  . Allergic rhinitis   . Prostate cancer [facility])     Past Surgical History:  Procedure Laterality Date  . PROSTATE BIOPSY    . SHOULDER SURGERY      Current Medications: Current Meds  Medication Sig  . cetirizine (ZYRTEC) 10 MG tablet Take 10 mg by mouth daily.  . Multiple Vitamin (MULTIVITAMIN) capsule Take 1 capsule by mouth daily.  Marland Kitchen omeprazole (PRILOSEC) 40 MG capsule      Allergies:   Patient has no known allergies.   Social History   Tobacco Use  . Smoking status: Never Smoker  . Smokeless tobacco: Never Used  Vaping Use  . Vaping Use: Never used  Substance Use Topics  . Alcohol use: Yes    Comment: occ  . Drug use: Never     Family History: The patient's family history includes Diabetes in his father; Hypertension in his paternal grandmother; Prostate cancer (age of onset: 47) in his  father; Prostate cancer (age of onset: 68) in his brother. There is no history of Breast cancer, Colon cancer, or Pancreatic cancer.  ROS:   Please see the history of present illness.    All other systems reviewed and are negative.  EKGs/Labs/Other Studies Reviewed:    The following studies were reviewed today:  EKG:  SR with frequent PVCs  I have independently reviewed the images from CT chest with contrast 06/02/2017.  Upper lung predominant fibrotic interstitial lung disease as noted in the report concerning for pulmonary sarcoidosis, repeat chest CT recommended but I do not see that this has been  performed. There are severe three-vessel coronary artery calcifications that are quite prominent.  I have shared these images with the patient in the office today.  Also noted to have aortic atherosclerosis  Recent Labs: No results found for requested labs within last 8760 hours.  Recent Lipid Panel No results found for: CHOL, TRIG, HDL, CHOLHDL, VLDL, LDLCALC, LDLDIRECT  Physical Exam:    VS:  BP (!) 148/68 (BP Location: Left Arm, Patient Position: Sitting, Cuff Size: Normal)   Pulse 80   Ht 5\' 11"  (1.803 m)   Wt 172 lb 3.2 oz (78.1 kg)   SpO2 95%   BMI 24.02 kg/m     Wt Readings from Last 5 Encounters:  07/18/20 172 lb 3.2 oz (78.1 kg)  08/01/18 175 lb (79.4 kg)  08/15/17 177 lb 12.8 oz (80.6 kg)  08/01/17 177 lb 6.4 oz (80.5 kg)    Constitutional: No acute distress Eyes: sclera non-icteric, normal conjunctiva and lids ENMT: normal dentition, moist mucous membranes Cardiovascular: regular rhythm, normal rate, no murmurs. S1 and S2 normal. Radial pulses normal bilaterally. No jugular venous distention.  Respiratory: clear to auscultation bilaterally GI : normal bowel sounds, soft and nontender. No distention.   MSK: extremities warm, well perfused. No edema.  NEURO: grossly nonfocal exam, moves all extremities. PSYCH: alert and oriented x 3, normal mood and affect.   ASSESSMENT:    1. PVC's (premature ventricular contractions)   2. Palpitations   3. Coronary artery calcification   4. Elevated BP without diagnosis of hypertension   5. Pure hypercholesterolemia   6. Medication management   7. Malignant neoplasm of prostate (HCC)    PLAN:    PVC's (premature ventricular contractions) - Plan: EKG 12-Lead, ECHOCARDIOGRAM COMPLETE, LONG TERM MONITOR (3-14 DAYS) Palpitations -Frequent PVCs on ECG and symptomatic palpitations on a daily basis.  We will quantitate PVCs with 3-day monitor.  I would also like to screen for nonsustained VT or other arrhythmia.  He does drink  caffeine and I have asked him to stop caffeine use for the benefit of his PVCs.  We will obtain an echocardiogram to evaluate LV function, no significant heart failure symptoms today.  His chest CT from 2019 notes possible findings consistent with pulmonary sarcoidosis.  Would recommend repeat chest imaging with PCP to follow this up.  If there is a concern for sarcoidosis, this is one possible etiology for his PVCs, and he may warrant cardiac MR and further work-up.  We will assess after monitor and echo are back.  Coronary artery calcification -He has very significant coronary artery calcifications in a three-vessel distribution on his CT scan from 2019.  We discussed this in great detail today.  He describes no chest pain at this time.  I suspect his calcium score will be well over thousand, and ischemic evaluation is warranted, and likely soon.  We describe taking  a stepwise approach to the investigation of his severe coronary artery calcifications and will focus on PVCs and symptomatic palpitations first since he is having no chest pain.  We will however start a baby aspirin and high intensity statin therapy with Lipitor 40 mg daily.  He has a stone forming propensity and was noted to have right-sided nephrolithiasis on his 2019 CT as well.  Consider work-up for primary or secondary calcium disorder given degree of coronary calcifications.  Elevated BP without diagnosis of hypertension -BP mildly elevated today, will reassess at next follow-up  Pure hypercholesterolemia -LDL 125.  With the degree of coronary calcifications he had in 2019, would start statin therapy today.  Medication management -Start aspirin and statin.  Consider beta-blocker for PVCs.  Malignant neoplasm of prostate ([place])  Total time of encounter: 75 minutes total time of encounter, including 40 minutes spent in face-to-face patient care on the date of this encounter. This time includes coordination of care and counseling  regarding above mentioned problem list. Remainder of non-face-to-face time involved reviewing chart documents/testing relevant to the patient encounter and documentation in the medical record. I have independently reviewed documentation from referring provider.   Cherlynn Kaiser, MD, [place] Director of Advanced Cardiac Imaging and Non-Invasive Cardiology Direct Dial: [phone]  Fax: [phone]  Website:  [email or web address]   Medication Adjustments/Labs and Tests Ordered: Current medicines are reviewed at length with the patient today.  Concerns regarding medicines are outlined above.   Orders Placed This Encounter  Procedures  . LONG TERM MONITOR (3-14 DAYS)  . EKG 12-Lead  . ECHOCARDIOGRAM COMPLETE   Meds ordered this encounter  Medications  . aspirin EC 81 MG tablet    Sig: Take 1 tablet (81 mg total) by mouth daily. Swallow whole.    Dispense:  90 tablet    Refill:  0  . atorvastatin (LIPITOR) 40 MG tablet    Sig: Take 1 tablet (40 mg total) by mouth daily.    Dispense:  90 tablet    Refill:  3    Patient Instructions  Medication Instructions:  START: ASPIRIN 81mg  DAILY START: ATORVASTATIN 40mg  DAILY  *If you need a refill on your cardiac medications before your next appointment, please call your pharmacy*  Testing/Procedures: Your physician has requested that you have an echocardiogram. Echocardiography is a painless test that uses sound waves to create images of your heart. It provides your doctor with information about the size and shape of your heart and how well your heart's chambers and valves are working. You may receive an ultrasound enhancing agent through an IV if needed to better visualize your heart during the echo.This procedure takes approximately one hour. There are no restrictions for this procedure. This will take place at the [address].   ZIO XT- Long Term Monitor Instructions   Your physician  has requested you wear your ZIO patch monitor 3 days.   This is a single patch monitor.  [employer] supplies one patch monitor per enrollment.  Additional stickers are not available.   Please do not apply patch if you will be having a Nuclear Stress Test, Echocardiogram, Cardiac CT, MRI, or Chest Xray during the time frame you would be wearing the monitor. The patch cannot be worn during these tests.  You cannot remove and re-apply the ZIO XT patch monitor.   Your ZIO patch monitor will be sent USPS Priority mail from [facility] directly to your home address.  The monitor may also be mailed to a PO BOX if home delivery is not available.   It may take 3-5 days to receive your monitor after you have been enrolled.   Once you have received you monitor, please review enclosed instructions.  Your monitor has already been registered assigning a specific monitor serial # to you.   Applying the monitor   Shave hair from upper left chest.   Hold abrader disc by orange tab.  Rub abrader in 40 strokes over left upper chest as indicated in your monitor instructions.   Clean area with 4 enclosed alcohol pads .  Use all pads to assure are is cleaned thoroughly.  Let dry.   Apply patch as indicated in monitor instructions.  Patch will be place under collarbone on left side of chest with arrow pointing upward.   Rub patch adhesive wings for 2 minutes.Remove white label marked "1".  Remove white label marked "2".  Rub patch adhesive wings for 2 additional minutes.   While looking in a mirror, press and release button in center of patch.  A small green light will flash 3-4 times .  This will be your only indicator the monitor has been turned on.     Do not shower for the first 24 hours.  You may shower after the first 24 hours.   Press button if you feel a symptom. You will hear a small click.  Record Date, Time and Symptom in the Patient Log Book.   When you are ready to remove patch, follow  instructions on last 2 pages of Patient Log Book.  Stick patch monitor onto last page of Patient Log Book.   Place Patient Log Book in [place] box.  Use locking tab on box and tape box closed securely.  The [employer] has [employer] on it.  Please place in mailbox as soon as possible.  Your physician should have your test results approximately 7 days after the monitor has been mailed back to [facility].   Call [place] at [phone] if you have questions regarding your ZIO XT patch monitor.  Call them immediately if you see an orange light blinking on your monitor.   If your monitor falls off in less than 4 days contact our Monitor department at [phone].  If your monitor becomes loose or falls off after 4 days call [employer] at [phone] for suggestions on securing your monitor.   Follow-Up: At [facility], you and your health needs are our priority.  As part of our continuing mission to provide you with exceptional heart care, we have created designated Provider Care Teams.  These Care Teams include your primary Cardiologist (physician) and Advanced Practice Providers (APPs -  Physician Assistants and Nurse Practitioners) who all work together to provide you with the care you need, when you need it.  Your next appointment:   AFTER ECHOCARDIOGRAM AND MONITOR   The format for your next appointment:   In Person  Provider:   Cherlynn Kaiser, MD

## 2020-07-18 NOTE — Patient Instructions (Signed)
Medication Instructions:  START: ASPIRIN 81mg  DAILY START: ATORVASTATIN 40mg  DAILY  *If you need a refill on your cardiac medications before your next appointment, please call your pharmacy*  Testing/Procedures: Your physician has requested that you have an echocardiogram. Echocardiography is a painless test that uses sound waves to create images of your heart. It provides your doctor with information about the size and shape of your heart and how well your heart's chambers and valves are working. You may receive an ultrasound enhancing agent through an IV if needed to better visualize your heart during the echo.This procedure takes approximately one hour. There are no restrictions for this procedure. This will take place at the [address], [address].   ZIO XT- Long Term Monitor Instructions   Your physician has requested you wear your ZIO patch monitor 3 days.   This is a single patch monitor.  [employer] supplies one patch monitor per enrollment.  Additional stickers are not available.   Please do not apply patch if you will be having a Nuclear Stress Test, Echocardiogram, Cardiac CT, MRI, or Chest Xray during the time frame you would be wearing the monitor. The patch cannot be worn during these tests.  You cannot remove and re-apply the ZIO XT patch monitor.   Your ZIO patch monitor will be sent USPS Priority mail from [facility] directly to your home address. The monitor may also be mailed to a PO BOX if home delivery is not available.   It may take 3-5 days to receive your monitor after you have been enrolled.   Once you have received you monitor, please review enclosed instructions.  Your monitor has already been registered assigning a specific monitor serial # to you.   Applying the monitor   Shave hair from upper left chest.   Hold abrader disc by orange tab.  Rub abrader in 40 strokes over left upper chest as indicated in your monitor instructions.   Clean area with 4  enclosed alcohol pads .  Use all pads to assure are is cleaned thoroughly.  Let dry.   Apply patch as indicated in monitor instructions.  Patch will be place under collarbone on left side of chest with arrow pointing upward.   Rub patch adhesive wings for 2 minutes.Remove white label marked "1".  Remove white label marked "2".  Rub patch adhesive wings for 2 additional minutes.   While looking in a mirror, press and release button in center of patch.  A small green light will flash 3-4 times .  This will be your only indicator the monitor has been turned on.     Do not shower for the first 24 hours.  You may shower after the first 24 hours.   Press button if you feel a symptom. You will hear a small click.  Record Date, Time and Symptom in the Patient Log Book.   When you are ready to remove patch, follow instructions on last 2 pages of Patient Log Book.  Stick patch monitor onto last page of Patient Log Book.   Place Patient Log Book in [place] box.  Use locking tab on box and tape box closed securely.  The [employer] has [employer] on it.  Please place in mailbox as soon as possible.  Your physician should have your test results approximately 7 days after the monitor has been mailed back to [facility].   Call [place] at [phone] if you have questions  regarding your ZIO XT patch monitor.  Call them immediately if you see an orange light blinking on your monitor.   If your monitor falls off in less than 4 days contact our Monitor department at [phone].  If your monitor becomes loose or falls off after 4 days call [employer] at [phone] for suggestions on securing your monitor.   Follow-Up: At [facility], you and your health needs are our priority.  As part of our continuing mission to provide you with exceptional heart care, we have created designated Provider Care Teams.  These Care Teams include your primary Cardiologist (physician) and  Advanced Practice Providers (APPs -  Physician Assistants and Nurse Practitioners) who all work together to provide you with the care you need, when you need it.  Your next appointment:   AFTER ECHOCARDIOGRAM AND MONITOR   The format for your next appointment:   In Person  Provider:   Cherlynn Kaiser, MD

## 2020-07-21 ENCOUNTER — Ambulatory Visit (INDEPENDENT_AMBULATORY_CARE_PROVIDER_SITE_OTHER): Payer: PPO

## 2020-07-21 ENCOUNTER — Encounter: Payer: Self-pay | Admitting: Radiology

## 2020-07-21 DIAGNOSIS — I493 Ventricular premature depolarization: Secondary | ICD-10-CM

## 2020-07-21 NOTE — Progress Notes (Signed)
Enrolled patient for a 3 day Zio XT to be mailed to patients home. Per patient request Randall Hodges was altered to wait till 4/11 to mail monitor

## 2020-07-25 ENCOUNTER — Telehealth: Payer: Self-pay | Admitting: Internal Medicine

## 2020-07-25 NOTE — Telephone Encounter (Signed)
Pt states he would like a portable heart monitor mailed out to him.Please advise.

## 2020-07-25 NOTE — Telephone Encounter (Signed)
Left message making patient aware that monitor to be mailed on Monday 4/11 (see previous note).   Advised to call back with questions.

## 2020-08-04 ENCOUNTER — Telehealth: Payer: Self-pay | Admitting: Internal Medicine

## 2020-08-04 DIAGNOSIS — E7800 Pure hypercholesterolemia, unspecified: Secondary | ICD-10-CM

## 2020-08-04 NOTE — Telephone Encounter (Signed)
Pt c/o medication issue:  1. Name of Medication: atorvastatin (LIPITOR) 40 MG tablet  2. How are you currently taking this medication (dosage and times per day)? Take 1 tablet (40 mg total) by mouth daily.  3. Are you having a reaction (difficulty breathing--STAT)? No  4. What is your medication issue? Since pt has been taking Lipitor, pt has had dark urine

## 2020-08-04 NOTE — Telephone Encounter (Signed)
Called pt regarding response from , PharmD. Pt to stop atorvastatin and come in for a CMET. Pt unable to come tomorrow but will come in on Wednesday to have blood drawn. Explained to pt lab hours. Pt agrees to the plan and verbalizes understanding.

## 2020-08-04 NOTE — Telephone Encounter (Signed)
It's possible that the atorvastatin is affecting liver function, which can cause change in urine color.  Have him stop the atorvastatin and come in tomorrow morning for a CMET

## 2020-08-04 NOTE — Telephone Encounter (Signed)
Not directly - but if the atorvastatin is causing liver issues, that could.  Can he stop the atorvastatin today and come in tomorrow morning for a CMET?

## 2020-08-07 DIAGNOSIS — I493 Ventricular premature depolarization: Secondary | ICD-10-CM | POA: Diagnosis not present

## 2020-08-08 DIAGNOSIS — E7800 Pure hypercholesterolemia, unspecified: Secondary | ICD-10-CM | POA: Diagnosis not present

## 2020-08-08 NOTE — Telephone Encounter (Signed)
Attempted to call patient, left message for patient to call back to office.  Left VM reminder for need for labs.

## 2020-08-09 LAB — COMPREHENSIVE METABOLIC PANEL
ALT: 19 IU/L (ref 0–44)
AST: 23 IU/L (ref 0–40)
Albumin/Globulin Ratio: 1.7 (ref 1.2–2.2)
Albumin: 3.9 g/dL (ref 3.8–4.8)
Alkaline Phosphatase: 66 IU/L (ref 44–121)
BUN/Creatinine Ratio: 14 (ref 10–24)
BUN: 17 mg/dL (ref 8–27)
Bilirubin Total: 0.3 mg/dL (ref 0.0–1.2)
CO2: 24 mmol/L (ref 20–29)
Calcium: 9 mg/dL (ref 8.6–10.2)
Chloride: 103 mmol/L (ref 96–106)
Creatinine, Ser: 1.21 mg/dL (ref 0.76–1.27)
Globulin, Total: 2.3 g/dL (ref 1.5–4.5)
Glucose: 111 mg/dL — ABNORMAL HIGH (ref 65–99)
Potassium: 4.5 mmol/L (ref 3.5–5.2)
Sodium: 142 mmol/L (ref 134–144)
Total Protein: 6.2 g/dL (ref 6.0–8.5)
eGFR: 64 mL/min/{1.73_m2} (ref >59)

## 2020-08-12 ENCOUNTER — Telehealth: Payer: Self-pay

## 2020-08-12 NOTE — Telephone Encounter (Signed)
Pt returned a call and I gave results and pt voiced understanding

## 2020-08-12 NOTE — Telephone Encounter (Signed)
Called and lmom pt that we have lab results to discuss and to call us back asap

## 2020-08-14 DIAGNOSIS — I493 Ventricular premature depolarization: Secondary | ICD-10-CM | POA: Diagnosis not present

## 2020-08-15 ENCOUNTER — Telehealth: Payer: Self-pay | Admitting: Internal Medicine

## 2020-08-15 DIAGNOSIS — R002 Palpitations: Secondary | ICD-10-CM

## 2020-08-15 DIAGNOSIS — I493 Ventricular premature depolarization: Secondary | ICD-10-CM

## 2020-08-15 MED ORDER — METOPROLOL SUCCINATE ER 25 MG PO TB24: 25.0000 mg | ORAL_TABLET | Freq: Every day | ORAL | 3 refills | Status: DC

## 2020-08-15 NOTE — Telephone Encounter (Signed)
Please see other telephone encounter. Patient aware of Labs and results.

## 2020-08-15 NOTE — Telephone Encounter (Signed)
Received a call from [facility] calling to report patient's monitor is complete.Calling to report monitor revealed bradycardia rate 36 for 30 seconds.30 runs of SVT.Report to be sent to Dr.Acharya.

## 2020-08-15 NOTE — Telephone Encounter (Signed)
PT is returning Jenna's phone call.Please advise

## 2020-08-15 NOTE — Telephone Encounter (Signed)
Spoke with patient, advised patient of Dr. Lubertha South. Acharya's recommendations. Patient is agreeable to plan.  Sent prescription for Toprol XL-25mg  Daily into patient preferred pharmacy.   Referral for EP placed.   Advised patient to monitor symptoms, and monitor HR and BP. Made patient aware of ED precautions should new or worsening symptoms develop.   Advised patient to call back to office with any issues questions, or concerns.

## 2020-08-15 NOTE — Telephone Encounter (Signed)
Had DOD (Dr. Stanford Breed) review rhythm strips and he recommends patient start TOPROL XL- 25mg  Daily   Spoke with [name] who agrees with Dr. Stanford Breed, and also would like patient to see Electrophysiology.   Attempted to call patient on both numbers listed in chart. Left message for patient to return call to office when able.   Will place orders upon speaking with patient.

## 2020-08-15 NOTE — Telephone Encounter (Signed)
Received call to extension number, but upon answering the phone no one on the line. Attempted to return call but unable to reach patient.

## 2020-08-15 NOTE — Addendum Note (Signed)
Addended by: Rexanne Mano B on: 08/15/2020 05:00 PM   Modules accepted: Orders

## 2020-08-19 ENCOUNTER — Encounter: Payer: Self-pay | Admitting: Cardiology

## 2020-08-19 ENCOUNTER — Other Ambulatory Visit: Payer: Self-pay

## 2020-08-19 ENCOUNTER — Ambulatory Visit: Payer: PPO | Admitting: Cardiology

## 2020-08-19 VITALS — BP 148/78 | HR 76 | Ht 71.0 in | Wt 169.2 lb

## 2020-08-19 DIAGNOSIS — I493 Ventricular premature depolarization: Secondary | ICD-10-CM

## 2020-08-19 MED ORDER — METOPROLOL SUCCINATE ER 50 MG PO TB24: 50.0000 mg | ORAL_TABLET | Freq: Every day | ORAL | 1 refills | Status: DC

## 2020-08-19 NOTE — Progress Notes (Signed)
Electrophysiology Office Note   Date:  08/19/2020   ID:  Randall Hodges, Lada 08-26-1950, MRN [id number]  PCP:  [name], MD  Cardiologist:  [name] Primary Electrophysiologist:  Yachet Mattson Meredith Leeds, MD    Chief Complaint: PVCs   History of Present Illness: Randall Hodges is a 70 y.o. male who is being seen today for the evaluation of PVCs at the request of [name], MD. Presenting today for electrophysiology evaluation.  He has a history of arthritis and kidney stones.  He has no significant past cardiac history.  He presented to general cardiology clinic with PVCs and palpitations.  He has a strong family history of coronary artery disease.  He had a CT chest in 2019 that showed coronary artery calcifications.  3 to 4 months ago, he began having palpitations.  He would feel a few beats in a row and then a short pause and then a thump in his chest.  These worsened over the last few months.  He usually drinks 2 to 4 cups of coffee a day.  Today, he denies symptoms of chest pain, shortness of breath, orthopnea, PND, lower extremity edema, claudication, dizziness, presyncope, syncope, bleeding, or neurologic sequela. The patient is tolerating medications without difficulties.  He feels well today.  He is continued to have palpitations.  His palpitations are bothersome, but do not cause him much in the way of further issues.  At this point, he has not yet tried his metoprolol.   Past Medical History:  Diagnosis Date  . Allergic rhinitis   . Prostate cancer [facility])    Past Surgical History:  Procedure Laterality Date  . PROSTATE BIOPSY    . SHOULDER SURGERY       Current Outpatient Medications  Medication Sig Dispense Refill  . aspirin EC 81 MG tablet Take 1 tablet (81 mg total) by mouth daily. Swallow whole. 90 tablet 0  . atorvastatin (LIPITOR) 20 MG tablet Take 20 mg by mouth daily.    . cetirizine (ZYRTEC) 10 MG tablet Take 10 mg by mouth daily.    .  metoprolol succinate (TOPROL-XL) 50 MG 24 hr tablet Take 1 tablet (50 mg total) by mouth daily. Take with or immediately following a meal. 90 tablet 1  . omeprazole (PRILOSEC) 40 MG capsule Take 40 mg by mouth as needed.     No current facility-administered medications for this visit.    Allergies:   Amoxicillin-pot clavulanate   Social History:  The patient  reports that he has never smoked. He has never used smokeless tobacco. He reports current alcohol use. He reports that he does not use drugs.   Family History:  The patient's family history includes Diabetes in his father; Hypertension in his paternal grandmother; Prostate cancer (age of onset: 48) in his father; Prostate cancer (age of onset: 94) in his brother.    ROS:  Please see the history of present illness.   Otherwise, review of systems is positive for none.   All other systems are reviewed and negative.    PHYSICAL EXAM: VS:  BP (!) 148/78   Pulse 76   Ht 5\' 11"  (1.803 m)   Wt 169 lb 3.2 oz (76.7 kg)   SpO2 97%   BMI 23.60 kg/m  , BMI Body mass index is 23.6 kg/m. GEN: Well nourished, well developed, in no acute distress  HEENT: normal  Neck: no JVD, carotid bruits, or masses Cardiac: Irregular; no murmurs, rubs, or gallops,no edema  Respiratory:  clear to auscultation bilaterally, normal work of breathing GI: soft, nontender, nondistended, + BS MS: no deformity or atrophy  Skin: warm and dry Neuro:  Strength and sensation are intact Psych: euthymic mood, full affect  EKG:  EKG is ordered today. Personal review of the ekg ordered shows sinus rhythm with supraventricular and ventricular ectopy  Recent Labs: 08/08/2020: ALT 19; BUN 17; Creatinine, Ser 1.21; Potassium 4.5; Sodium 142    Lipid Panel  No results found for: CHOL, TRIG, HDL, CHOLHDL, VLDL, LDLCALC, LDLDIRECT   Wt Readings from Last 3 Encounters:  08/19/20 169 lb 3.2 oz (76.7 kg)  07/18/20 172 lb 3.2 oz (78.1 kg)  08/01/18 175 lb (79.4 kg)       Other studies Reviewed: Additional studies/ records that were reviewed today include: Cardiac monitor 08/14/2020 personally reviewed Review of the above records today demonstrates:  Max 176 bpm 07:46am, 04/23 Min 33 bpm 05:53am, 04/22 Avg 74 bpm 21% supraventricular ectopy Rare ventricular ectopy Symptoms associated with ectopy  ASSESSMENT AND PLAN:  1.  PACs: Cardiac monitor showed a 21% PAC burden.  His ECG shows obvious PACs and possibly some PVCs.  He has quite a bit of ectopy, so it is difficult to tell as he has multiple morphologies of his QRS complex.  He has not yet had his echo.  His echo is scheduled for later this week.  He is also not started his metoprolol.  I told him to take his metoprolol but Rosaire Cueto increase the dose to 50 mg.  I Breniyah Romm see him back in 6 weeks after his echo was performed to see if he has had much in the way of improvement.  2.  Coronary artery calcifications: Has calcifications in a three-vessel distribution.  Likely Leshon Armistead need further evaluation per primary cardiology.  3.  Hyperlipidemia: Currently on statin per primary cardiology  Case discussed with primary cardiology  Current medicines are reviewed at length with the patient today.   The patient does not have concerns regarding his medicines.  The following changes were made today: Increase Toprol-XL  Labs/ tests ordered today include:  Orders Placed This Encounter  Procedures  . EKG 12-Lead     Disposition:   FU with Deryck Hippler 6 weeks  Signed, Dannae Kato Meredith Leeds, MD  08/19/2020 2:16 PM     [address] [place] [address] [phone] (office) [phone] (fax)

## 2020-08-19 NOTE — Patient Instructions (Addendum)
Medication Instructions:  Your physician has recommended you make the following change in your medication:  1. INCREASE Toprol to 50 mg once daily -- take this at night  *If you need a refill on your cardiac medications before your next appointment, please call your pharmacy*   Lab Work: None ordered   Testing/Procedures: None ordered   Follow-Up: At [facility], you and your health needs are our priority.  As part of our continuing mission to provide you with exceptional heart care, we have created designated Provider Care Teams.  These Care Teams include your primary Cardiologist (physician) and Advanced Practice Providers (APPs -  Physician Assistants and Nurse Practitioners) who all work together to provide you with the care you need, when you need it.  We recommend signing up for the patient portal called "MyChart".  Sign up information is provided on this After Visit Summary.  MyChart is used to connect with patients for Virtual Visits (Telemedicine).  Patients are able to view lab/test results, encounter notes, upcoming appointments, etc.  Non-urgent messages can be sent to your provider as well.   To learn more about what you can do with MyChart, go to [email or web address].    Your next appointment:   6 week(s)  The format for your next appointment:   In Person  Provider:   Allegra Lai, MD    Thank you for choosing [facility]!!   Trinidad Curet, RN [phone]

## 2020-08-21 ENCOUNTER — Other Ambulatory Visit: Payer: Self-pay

## 2020-08-21 ENCOUNTER — Ambulatory Visit (HOSPITAL_COMMUNITY): Payer: PPO | Attending: Cardiovascular Disease

## 2020-08-21 DIAGNOSIS — I358 Other nonrheumatic aortic valve disorders: Secondary | ICD-10-CM | POA: Diagnosis not present

## 2020-08-21 DIAGNOSIS — I493 Ventricular premature depolarization: Secondary | ICD-10-CM | POA: Insufficient documentation

## 2020-08-21 DIAGNOSIS — Z8249 Family history of ischemic heart disease and other diseases of the circulatory system: Secondary | ICD-10-CM | POA: Diagnosis not present

## 2020-08-21 LAB — ECHOCARDIOGRAM COMPLETE
Area-P 1/2: 3.17 cm2
S' Lateral: 3.3 cm

## 2020-08-25 DIAGNOSIS — C61 Malignant neoplasm of prostate: Secondary | ICD-10-CM | POA: Diagnosis not present

## 2020-08-25 DIAGNOSIS — D400 Neoplasm of uncertain behavior of prostate: Secondary | ICD-10-CM | POA: Diagnosis not present

## 2020-08-25 DIAGNOSIS — N5201 Erectile dysfunction due to arterial insufficiency: Secondary | ICD-10-CM | POA: Diagnosis not present

## 2020-08-26 ENCOUNTER — Encounter: Payer: Self-pay | Admitting: Internal Medicine

## 2020-08-26 ENCOUNTER — Other Ambulatory Visit: Payer: Self-pay

## 2020-08-26 ENCOUNTER — Ambulatory Visit: Payer: PPO | Admitting: Internal Medicine

## 2020-08-26 VITALS — BP 118/62 | HR 58 | Ht 71.0 in | Wt 171.8 lb

## 2020-08-26 DIAGNOSIS — R0789 Other chest pain: Secondary | ICD-10-CM | POA: Diagnosis not present

## 2020-08-26 DIAGNOSIS — I493 Ventricular premature depolarization: Secondary | ICD-10-CM | POA: Diagnosis not present

## 2020-08-26 NOTE — Patient Instructions (Signed)
Medication Instructions:  No Changes In Medications at this time.  *If you need a refill on your cardiac medications before your next appointment, please call your pharmacy*  Testing/Procedures: Your physician has requested that you have en exercise stress myoview. For further information please visit [email or web address]. Please follow instruction sheet, as given. PLEASE SCHEDULE  THIS AT [facility] ST. OFFICE  The test will take approximately 3 to 4 hours to complete; you may bring reading material.  If someone comes with you to your appointment, they will need to remain in the main lobby due to limited space in the testing area.    How to prepare for your Myocardial Perfusion Test:  Do not eat or drink 3 hours prior to your test, except you may have water.  Do not consume products containing caffeine (regular or decaffeinated) 12 hours prior to your test. (ex: coffee, chocolate, sodas, tea).  Do wear comfortable clothes (no dresses or overalls) and walking shoes, tennis shoes preferred (No heels or open toe shoes are allowed).  Do NOT wear cologne, perfume, aftershave, or lotions (deodorant is allowed).  If you use an inhaler, use it the AM of your test and bring it with you.   If you use a nebulizer, use it the AM of your test.   If these instructions are not followed, your test will have to be rescheduled.  Follow-Up: At [facility], you and your health needs are our priority.  As part of our continuing mission to provide you with exceptional heart care, we have created designated Provider Care Teams.  These Care Teams include your primary Cardiologist (physician) and Advanced Practice Providers (APPs -  Physician Assistants and Nurse Practitioners) who all work together to provide you with the care you need, when you need it.  We recommend signing up for the patient portal called "MyChart".  Sign up information is provided on this After Visit Summary.  MyChart is used to connect with  patients for Virtual Visits (Telemedicine).  Patients are able to view lab/test results, encounter notes, upcoming appointments, etc.  Non-urgent messages can be sent to your provider as well.   To learn more about what you can do with MyChart, go to [email or web address].    Your next appointment:   3 month(s)  The format for your next appointment:   In Person  Provider:   Cherlynn Kaiser, MD

## 2020-08-26 NOTE — Progress Notes (Signed)
Cardiology Office Note:    Date:  08/26/2020   ID:  Randall Hodges, DOB [birth year], MRN [id number]  PCP:  [name], MD  Cardiologist:  [name], MD  Electrophysiologist:  None   Referring MD: [name], MD   Chief Complaint/Reason for Referral: PACs/PVCs  History of Present Illness:    Randall Hodges is a 70 y.o. male with a history of arthritis, kidney stones, and severe three-vessel coronary artery calcifications who presents for follow-up of PACs and PVCs.  Recently seen by EP for high burden of PACs and also reports of symptomatic bradycardia.  Felt to be safe to take metoprolol succinate for suppression of ectopy.  Echocardiogram performed and structurally normal.  Cardiac monitor results reviewed today as well.  Today, he is feeling good overall. After beginning his 25 mg dose of metoprolol a week ago, he noticed that his palpitations improved and were not as frequent within the first 2 days. He is comfortable with this and wishes to prolong his current dosage before increasing the dose. After 2 or 3 days of taking Lipitor his urine became dark, and he discontinued Lipitor. He then began the medication again and reports the dark urine has not returned. He denies any pre-syncope, syncope, or lightheadedness/dizziness. Also has no lower extremity edema, orthopnea or PND. He is pleased with noticing a steady heart rate when he wakes up in the morning.  After some exertion while walking, he starts to feel a little "out of shape." He does not describe this as being short of breath. For a few minutes he has some chest discomfort/pressure that he has become familiar with over the years.  He has been trying to cut back on his caffeine intake over the past few weeks.   He reports having a history of kidney stones. His family history includes calcium build-up.   Past Medical History:  Diagnosis Date  . Allergic rhinitis   . Prostate cancer [facility])     Past  Surgical History:  Procedure Laterality Date  . PROSTATE BIOPSY    . SHOULDER SURGERY      Current Medications: Current Meds  Medication Sig  . aspirin EC 81 MG tablet Take 1 tablet (81 mg total) by mouth daily. Swallow whole.  Marland Kitchen atorvastatin (LIPITOR) 20 MG tablet Take 20 mg by mouth daily.  . cetirizine (ZYRTEC) 10 MG tablet Take 10 mg by mouth daily.  . metoprolol succinate (TOPROL-XL) 50 MG 24 hr tablet Take 1 tablet (50 mg total) by mouth daily. Take with or immediately following a meal.  . omeprazole (PRILOSEC) 40 MG capsule Take 40 mg by mouth as needed.     Allergies:   Amoxicillin-pot clavulanate   Social History   Tobacco Use  . Smoking status: Never Smoker  . Smokeless tobacco: Never Used  Vaping Use  . Vaping Use: Never used  Substance Use Topics  . Alcohol use: Yes    Comment: occ  . Drug use: Never     Family History: The patient's family history includes Diabetes in his father; Hypertension in his paternal grandmother; Prostate cancer (age of onset: 48) in his father; Prostate cancer (age of onset: 11) in his brother. There is no history of Breast cancer, Colon cancer, or Pancreatic cancer.  ROS:   Please see the history of present illness.    (+) Palpitations (+) Chest discomfort/pressure All other systems reviewed and are negative.  EKGs/Labs/Other Studies Reviewed:    The following studies were reviewed  today:  Echo 08/21/2020: 1. Left ventricular ejection fraction, by estimation, is 60 to 65%. The  left ventricle has normal function. The left ventricle has no regional  wall motion abnormalities. Left ventricular diastolic parameters were  normal.  2. Right ventricular systolic function is normal. The right ventricular  size is normal. Tricuspid regurgitation signal is inadequate for assessing  PA pressure.  3. The mitral valve is normal in structure. Trivial mitral valve  regurgitation. No evidence of mitral stenosis.  4. The aortic valve is  tricuspid. There is mild thickening of the aortic  valve. Aortic valve regurgitation is not visualized. No aortic stenosis is  present.  5. The inferior vena cava is normal in size with greater than 50%  respiratory variability, suggesting right atrial pressure of 3 mmHg.   Conclusion(s)/Recommendation(s): Frequent PVCs are seen during the study.   EKG:   08/26/2020: Sinus bradycardia, rate 58 bpm, PACs 08/19/2020: SR with frequent PVCs  I have independently reviewed the images from CT chest with contrast 06/02/2017.  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  There are severe three-vessel coronary artery calcifications that are quite prominent.  I have shared these images with the patient in the office.  Also noted to have aortic atherosclerosis  Recent Labs: 08/08/2020: ALT 19; BUN 17; Creatinine, Ser 1.21; Potassium 4.5; Sodium 142  Recent Lipid Panel No results found for: CHOL, TRIG, HDL, CHOLHDL, VLDL, LDLCALC, LDLDIRECT  Physical Exam:    VS:  BP 118/62   Pulse (!) 58   Ht 5\' 11"  (1.803 m)   Wt 171 lb 12.8 oz (77.9 kg)   SpO2 98%   BMI 23.96 kg/m     Wt Readings from Last 5 Encounters:  08/26/20 171 lb 12.8 oz (77.9 kg)  08/19/20 169 lb 3.2 oz (76.7 kg)  07/18/20 172 lb 3.2 oz (78.1 kg)  08/01/18 175 lb (79.4 kg)  08/15/17 177 lb 12.8 oz (80.6 kg)    Constitutional: No acute distress Eyes: sclera non-icteric, normal conjunctiva and lids ENMT: normal dentition, moist mucous membranes Cardiovascular: regular rhythm, normal rate, no murmurs. S1 and S2 normal. Radial pulses normal bilaterally. No jugular venous distention.  Respiratory: clear to auscultation bilaterally GI : normal bowel sounds, soft and nontender. No distention.   MSK: extremities warm, well perfused. No edema.  NEURO: grossly nonfocal exam, moves all  extremities. PSYCH: alert and oriented x 3, normal mood and affect.   ASSESSMENT:    1. PVC's (premature ventricular contractions)   2. Chest pressure    PLAN:    PACs/PVCs -frequent ectopy which is felt to mostly represent PACs but cannot exclude different morphologies which could represent PVCs.  Recently reviewed by EP.  Felt to be safe to take metoprolol succinate 25 mg a day.  50 mg was recommended, but the patient would prefer to stay on 25 mg a day.  We discussed evaluation for ischemia as noted below.  Chest pressure Coronary artery calcifications -He has extensive coronary artery calcifications and I suggested to him a recommendation for a work-up for primary or secondary hyperparathyroidism.  This is outside the scope of my expertise and recommend this pursued with his primary care doctor.  He has a history of renal stones while living in an area of [place]. -He notes chest pressure when walking up a hill which relieves in minutes.  With his coronary artery calcifications I would recommend ischemic testing particularly with frequent ectopy we will need to exclude ischemia.  Recommend treadmill nuclear stress test to be performed on the D SPECT camera at [facility]. -Continue aspirin and statin.  Hyperlipidemia- had dark-colored urine when he first started atorvastatin but this has resolved and LFTs are normal.  Continue at current dose.  Total time of encounter: 30 minutes total time of encounter, including 25 minutes spent in face-to-face patient care on the date of this encounter. This time includes coordination of care and counseling regarding above mentioned problem list. Remainder of non-face-to-face time involved reviewing chart documents/testing relevant to the patient encounter and documentation in the medical record. I have independently reviewed documentation from referring provider.   Cherlynn Kaiser, MD, [facility] Director of  Advanced Cardiac Imaging and Non-Invasive Cardiology Direct Dial: [phone]  Fax: [phone]  Website:  [email or web address]   Medication Adjustments/Labs and Tests Ordered: Current medicines are reviewed at length with the patient today.  Concerns regarding medicines are outlined above.   Orders Placed This Encounter  Procedures  . MYOCARDIAL PERFUSION IMAGING  . EKG 12-Lead   No orders of the defined types were placed in this encounter.   Patient Instructions  Medication Instructions:  No Changes In Medications at this time.  *If you need a refill on your cardiac medications before your next appointment, please call your pharmacy*  Testing/Procedures: Your physician has requested that you have en exercise stress myoview. For further information please visit [email or web address]. Please follow instruction sheet, as given. PLEASE SCHEDULE  THIS AT [facility] ST. OFFICE  The test will take approximately 3 to 4 hours to complete; you may bring reading material.  If someone comes with you to your appointment, they will need to remain in the main lobby due to limited space in the testing area.    How to prepare for your Myocardial Perfusion Test:  Do not eat or drink 3 hours prior to your test, except you may have water.  Do not consume products containing caffeine (regular or decaffeinated) 12 hours prior to your test. (ex: coffee, chocolate, sodas, tea).  Do wear comfortable clothes (no dresses or overalls) and walking shoes, tennis shoes preferred (No heels or open toe shoes are allowed).  Do NOT wear cologne, perfume, aftershave, or lotions (deodorant is allowed).  If you use an inhaler, use it the AM of your test and bring it with you.   If you use a nebulizer, use it the AM of your test.   If these instructions are not followed, your test will have to be rescheduled.  Follow-Up: At [facility], you and your health needs are our priority.  As part of our continuing  mission to provide you with exceptional heart care, we have created designated Provider Care Teams.  These Care Teams include your primary Cardiologist (physician) and Advanced Practice Providers (APPs -  Physician Assistants and Nurse Practitioners) who all work together to provide you with the care you need, when you need it.  We recommend signing up for the patient portal called "MyChart".  Sign up information is provided on this After Visit Summary.  MyChart is used to connect with patients for Virtual Visits (Telemedicine).  Patients are able to view lab/test results, encounter notes, upcoming appointments, etc.  Non-urgent messages can be sent to your provider as well.   To learn more about what you can do with MyChart, go to [email or web address].  Your next appointment:   3 month(s)  The format for your next appointment:   In Person  Provider:   Cherlynn Kaiser, MD      [facility],acting as a scribe for [name], MD.,have documented all relevant documentation on the behalf of [name], MD,as directed by  [name], MD while in the presence of [name], MD.  I, [name], MD, have reviewed all documentation for this visit. The documentation on 08/26/20 for the exam, diagnosis, procedures, and orders are all accurate and complete.

## 2020-08-29 NOTE — Addendum Note (Signed)
Addended by: Rexanne Mano B on: 08/29/2020 02:03 PM   Modules accepted: Orders

## 2020-08-29 NOTE — Addendum Note (Signed)
Addended by: Cherlynn Kaiser A on: 08/29/2020 02:30 PM   Modules accepted: Orders

## 2020-09-05 ENCOUNTER — Telehealth (HOSPITAL_COMMUNITY): Payer: Self-pay | Admitting: *Deleted

## 2020-09-05 NOTE — Telephone Encounter (Signed)

## 2020-09-09 ENCOUNTER — Other Ambulatory Visit: Payer: Self-pay

## 2020-09-09 ENCOUNTER — Ambulatory Visit (HOSPITAL_COMMUNITY): Payer: PPO | Attending: Internal Medicine

## 2020-09-09 DIAGNOSIS — I493 Ventricular premature depolarization: Secondary | ICD-10-CM | POA: Diagnosis not present

## 2020-09-09 DIAGNOSIS — R0789 Other chest pain: Secondary | ICD-10-CM | POA: Insufficient documentation

## 2020-09-09 LAB — MYOCARDIAL PERFUSION IMAGING
LV dias vol: 118 mL (ref 62–150)
LV sys vol: 70 mL
Peak HR: 127 {beats}/min
Rest HR: 62 {beats}/min
SDS: 2
SRS: 1
SSS: 3
TID: 0.96

## 2020-09-09 MED ORDER — REGADENOSON 0.4 MG/5ML IV SOLN
0.4000 mg | Freq: Once | INTRAVENOUS | Status: AC
  Administered 2020-09-09: 0.4 mg via INTRAVENOUS

## 2020-09-09 MED ORDER — TECHNETIUM TC 99M TETROFOSMIN IV KIT
10.1000 | PACK | Freq: Once | INTRAVENOUS | Status: AC | PRN
  Administered 2020-09-09: 10.1 via INTRAVENOUS
  Filled 2020-09-09: qty 11

## 2020-09-09 MED ORDER — TECHNETIUM TC 99M TETROFOSMIN IV KIT
31.3000 | PACK | Freq: Once | INTRAVENOUS | Status: AC | PRN
Start: 2020-09-09 — End: 2020-09-09
  Administered 2020-09-09: 31.3 via INTRAVENOUS
  Filled 2020-09-09: qty 32

## 2020-10-02 ENCOUNTER — Ambulatory Visit: Payer: PPO | Admitting: Cardiology

## 2020-10-02 ENCOUNTER — Encounter: Payer: Self-pay | Admitting: Cardiology

## 2020-10-02 ENCOUNTER — Other Ambulatory Visit: Payer: Self-pay

## 2020-10-02 VITALS — BP 108/64 | HR 56 | Ht 71.0 in | Wt 171.0 lb

## 2020-10-02 DIAGNOSIS — I493 Ventricular premature depolarization: Secondary | ICD-10-CM

## 2020-10-02 MED ORDER — FLECAINIDE ACETATE 50 MG PO TABS: 50.0000 mg | ORAL_TABLET | Freq: Two times a day (BID) | ORAL | 3 refills | Status: DC

## 2020-10-02 NOTE — Progress Notes (Signed)
Electrophysiology Office Note   Date:  10/02/2020   ID:  Randall Hodges, DOB [birth year], MRN [id number]  PCP:  [name], MD  Cardiologist:  [name] Primary Electrophysiologist:  Ernie Kasler Meredith Leeds, MD    Chief Complaint: PVCs   History of Present Illness: Randall Hodges is a 70 y.o. male who is being seen today for the evaluation of PVCs at the request of [name], MD. Presenting today for electrophysiology evaluation.  He has a history significant for arthritis and kidney stones.  He has no significant past medical history.  He presented to general cardiology with PVCs and palpitations.  He had a strong family history of coronary artery disease.  He had a CT scan of his chest in 2019 that showed coronary artery calcifications.  He developed palpitations January 2022.  He would feel a few beats and then a short pause and then a thump in his chest.  This had worsened over the past months.  He was started on metoprolol.  Today, denies symptoms of palpitations, chest pain, shortness of breath, orthopnea, PND, lower extremity edema, claudication, dizziness, presyncope, syncope, bleeding, or neurologic sequela. The patient is tolerating medications without difficulties.  Since starting his metoprolol, he has felt better.  He has less fatigue, shortness of breath, and feels less palpitations.  Despite that, he does continue to have short episodes.  He felt well approximately 1 year ago, and does not feel the same way now.   Past Medical History:  Diagnosis Date   Allergic rhinitis    Prostate cancer ([place])    Past Surgical History:  Procedure Laterality Date   PROSTATE BIOPSY     SHOULDER SURGERY       Current Outpatient Medications  Medication Sig Dispense Refill   aspirin EC 81 MG tablet Take 1 tablet (81 mg total) by mouth daily. Swallow whole. 90 tablet 0   atorvastatin (LIPITOR) 20 MG tablet Take 20 mg by mouth daily.     cetirizine (ZYRTEC) 10 MG tablet Take  10 mg by mouth daily.     flecainide (TAMBOCOR) 50 MG tablet Take 1 tablet (50 mg total) by mouth 2 (two) times daily. 60 tablet 3   metoprolol succinate (TOPROL-XL) 50 MG 24 hr tablet Take 1 tablet (50 mg total) by mouth daily. Take with or immediately following a meal. 90 tablet 1   omeprazole (PRILOSEC) 40 MG capsule Take 40 mg by mouth as needed.     No current facility-administered medications for this visit.    Allergies:   Amoxicillin-pot clavulanate, Plant derived enzymes, and Trichophyton   Social History:  The patient  reports that he has never smoked. He has never used smokeless tobacco. He reports current alcohol use. He reports that he does not use drugs.   Family History:  The patient's family history includes Diabetes in his father; Hypertension in his paternal grandmother; Prostate cancer (age of onset: 17) in his father; Prostate cancer (age of onset: 68) in his brother.   ROS:  Please see the history of present illness.   Otherwise, review of systems is positive for none.   All other systems are reviewed and negative.   PHYSICAL EXAM: VS:  BP 108/64   Pulse (!) 56   Ht 5\' 11"  (1.803 m)   Wt 171 lb (77.6 kg)   SpO2 98%   BMI 23.85 kg/m  , BMI Body mass index is 23.85 kg/m. GEN: Well nourished, well developed, in no acute distress  HEENT: normal  Neck: no JVD, carotid bruits, or masses Cardiac: RRR; no murmurs, rubs, or gallops,no edema  Respiratory:  clear to auscultation bilaterally, normal work of breathing GI: soft, nontender, nondistended, + BS MS: no deformity or atrophy  Skin: warm and dry Neuro:  Strength and sensation are intact Psych: euthymic mood, full affect  EKG:  EKG is ordered today. Personal review of the ekg ordered shows sinus rhythm, PVCs, rate 55  Recent Labs: 08/08/2020: ALT 19; BUN 17; Creatinine, Ser 1.21; Potassium 4.5; Sodium 142    Lipid Panel  No results found for: CHOL, TRIG, HDL, CHOLHDL, VLDL, LDLCALC, LDLDIRECT   Wt  Readings from Last 3 Encounters:  10/02/20 171 lb (77.6 kg)  09/09/20 171 lb (77.6 kg)  08/26/20 171 lb 12.8 oz (77.9 kg)      Other studies Reviewed: Additional studies/ records that were reviewed today include: Cardiac monitor 08/14/2020 personally reviewed Review of the above records today demonstrates:  Max 176 bpm 07:46am, 04/23 Min 33 bpm 05:53am, 04/22 Avg 74 bpm 21% supraventricular ectopy Rare ventricular ectopy Symptoms associated with ectopy  TTE 08/21/2020  1. Left ventricular ejection fraction, by estimation, is 60 to 65%. The  left ventricle has normal function. The left ventricle has no regional  wall motion abnormalities. Left ventricular diastolic parameters were  normal.   2. Right ventricular systolic function is normal. The right ventricular  size is normal. Tricuspid regurgitation signal is inadequate for assessing  PA pressure.   3. The mitral valve is normal in structure. Trivial mitral valve  regurgitation. No evidence of mitral stenosis.   4. The aortic valve is tricuspid. There is mild thickening of the aortic  valve. Aortic valve regurgitation is not visualized. No aortic stenosis is  present.   5. The inferior vena cava is normal in size with greater than 50%  respiratory variability, suggesting right atrial pressure of 3 mmHg.  Myoview 09/09/2020 The left ventricular ejection fraction is moderately decreased (30-44%). Nuclear stress EF: 41%. The study is normal. This is a low risk study.   ASSESSMENT AND PLAN:  1.  PACs/PVCs: Cardiac monitor showed a 21% burden.  ECG shows obvious PACs and some PVCs.  Currently on metoprolol.  He is feeling better on his metoprolol, but does continue to have intermittent palpitations and fatigue.  Due to that, we [name] start flecainide 50 mg.  He does have coronary artery calcifications, but fortunately has had a low risk Myoview.  I do not think that he [name] have issues with his flecainide.  2.  Coronary artery  calcifications: In a three-vessel distribution.  [name] need further evaluation per primary cardiology.  3.  Hyperlipidemia: Continue statin per primary cardiology   Current medicines are reviewed at length with the patient today.   The patient does not have concerns regarding his medicines.  The following changes were made today: Start flecainide  Labs/ tests ordered today include:  Orders Placed This Encounter  Procedures   EKG 12-Lead      Disposition:   FU with Genevia Bouldin 3 months  Signed, Leeanna Slaby Meredith Leeds, MD  10/02/2020 2:07 PM     [address] [place] [address] [phone] (office) [phone] (fax)

## 2020-10-02 NOTE — Patient Instructions (Signed)
Medication Instructions:  Your physician has recommended you make the following change in your medication:  START Flecainide 50 mg twice daily  *If you need a refill on your cardiac medications before your next appointment, please call your pharmacy*   Lab Work: None ordered   Testing/Procedures: None ordered   Follow-Up: At [facility], you and your health needs are our priority.  As part of our continuing mission to provide you with exceptional heart care, we have created designated Provider Care Teams.  These Care Teams include your primary Cardiologist (physician) and Advanced Practice Providers (APPs -  Physician Assistants and Nurse Practitioners) who all work together to provide you with the care you need, when you need it.  We recommend signing up for the patient portal called "MyChart".  Sign up information is provided on this After Visit Summary.  MyChart is used to connect with patients for Virtual Visits (Telemedicine).  Patients are able to view lab/test results, encounter notes, upcoming appointments, etc.  Non-urgent messages can be sent to your provider as well.   To learn more about what you can do with MyChart, go to [email or web address].    Your next appointment:   3 month(s)  The format for your next appointment:   In Person  Provider:   Allegra Lai, MD    Thank you for choosing [facility]!!   Trinidad Curet, RN [phone]   Other Instructions  Flecainide Tablets What is this medication? FLECAINIDE (FLEK a nide) prevents and treats a fast or irregular heartbeat (arrhythmia). It is often used to treat a type of arrhythmia known as AFib (atrial fibrillation). It works by slowing down overactive electric signals in the heart, which stabilizes your heart rhythm. It belongs to a group ofmedications called antiarrhythmics. This medicine may be used for other purposes; ask your health care provider orpharmacist if you have questions. COMMON BRAND  NAME(S): Tambocor What should I tell my care team before I take this medication? They need to know if you have any of these conditions: Abnormal levels of potassium in the blood Heart disease including heart rhythm and heart rate problems Kidney or liver disease Recent heart attack An unusual or allergic reaction to flecainide, local anesthetics, other medications, foods, dyes, or preservatives Pregnant or trying to get pregnant Breast-feeding How should I use this medication? Take this medication by mouth with a glass of water. Follow the directions on the prescription label. You can take this medication with or without food. Take your doses at regular intervals. Do not take your medication more often than directed. Do not stop taking this medication suddenly. This may cause serious, heart-related side effects. If your care team wants you to stop the medication,the dose may be slowly lowered over time to avoid any side effects. Talk to your care team regarding the use of this medication in children. While this medication may be prescribed for children as young as 1 year of age forselected conditions, precautions do apply. Overdosage: If you think you have taken too much of this medicine contact apoison control center or emergency room at once. NOTE: This medicine is only for you. Do not share this medicine with others. What if I miss a dose? If you miss a dose, take it as soon as you can. If it is almost time for yournext dose, take only that dose. Do not take double or extra doses. What may interact with this medication? Do not take this medication with any of the following: Amoxapine Arsenic  trioxide Certain antibiotics like clarithromycin, erythromycin, gatifloxacin, gemifloxacin, levofloxacin, moxifloxacin, sparfloxacin, or troleandomycin Certain antidepressants called tricyclic antidepressants like amitriptyline, imipramine, or nortriptyline Certain medications to control heart rhythm  like disopyramide, encainide, moricizine, procainamide, propafenone, and quinidine Cisapride Delavirdine Droperidol Haloperidol [place] Imatinib Levomethadyl Maprotiline Medications for malaria like chloroquine and halofantrine Pentamidine Phenothiazines like chlorpromazine, mesoridazine, prochlorperazine, thioridazine Pimozide Quinine Ranolazine Ritonavir Sertindole This medication may also interact with the following: Cimetidine Dofetilide Medications for angina or high blood pressure Medications to control heart rhythm like amiodarone and digoxin Ziprasidone This list may not describe all possible interactions. Give your health care provider a list of all the medicines, herbs, non-prescription drugs, or dietary supplements you use. Also tell them if you smoke, drink alcohol, or use illegaldrugs. Some items may interact with your medicine. What should I watch for while using this medication? Visit your care team for regular checks on your progress. Because your condition and the use of this medication carries some risk, it is a good idea to carry an identification card, necklace or bracelet with details of yourcondition, medications, and care team. Check your blood pressure and pulse rate regularly. Ask your care team what your blood pressure and pulse rate should be, and when you should contact them. Your care team also may schedule regular blood tests and electrocardiograms tocheck your progress. You may get drowsy or dizzy. Do not drive, use machinery, or do anything that needs mental alertness until you know how this medication affects you. Do not stand or sit up quickly, especially if you are an older patient. This reduces the risk of dizzy or fainting spells. Alcohol can make you more dizzy, increaseflushing and rapid heartbeats. Avoid alcoholic drinks. What side effects may I notice from receiving this medication? Side effects that you should report to your care team as soon as  possible: Allergic reactions-skin rash, itching, hives, swelling of the face, lips, tongue, or throat Heart failure-shortness of breath, swelling of the ankles, feet, or hands, sudden weight gain, unusual weakness or fatigue Heart rhythm changes-fast or irregular heartbeat, dizziness, feeling faint or lightheaded, chest pain, trouble breathing Liver injury-right upper belly pain, loss of appetite, nausea, light-colored stool, dark yellow or brown urine, yellowing skin or eyes, unusual weakness or fatigue Side effects that usually do not require medical attention (report to your careteam if they continue or are bothersome): Blurry vision Constipation Dizziness Fatigue Headache Nausea Tremors or shaking This list may not describe all possible side effects. Call your doctor for medical advice about side effects. You may report side effects to FDA at1-800-FDA-1088. Where should I keep my medication? Keep out of the reach of children and pets. Store at room temperature between 15 and 30 degrees C (59 and 86 degrees F). Protect from light. Keep container tightly closed. Throw away any unusedmedication after the expiration date. NOTE: This sheet is a summary. It may not cover all possible information. If you have questions about this medicine, talk to your doctor, pharmacist, orhealth care provider.  2022 Elsevier/Gold Standard (2020-05-08 12:17:39)

## 2020-10-18 DIAGNOSIS — Z03818 Encounter for observation for suspected exposure to other biological agents ruled out: Secondary | ICD-10-CM | POA: Diagnosis not present

## 2020-10-18 DIAGNOSIS — J209 Acute bronchitis, unspecified: Secondary | ICD-10-CM | POA: Diagnosis not present

## 2020-10-25 ENCOUNTER — Other Ambulatory Visit: Payer: Self-pay | Admitting: Cardiology

## 2020-10-28 ENCOUNTER — Other Ambulatory Visit: Payer: Self-pay | Admitting: Cardiology

## 2020-10-29 ENCOUNTER — Other Ambulatory Visit: Payer: Self-pay | Admitting: Cardiology

## 2020-10-29 ENCOUNTER — Encounter: Payer: Self-pay | Admitting: Cardiology

## 2020-10-29 NOTE — Telephone Encounter (Signed)
error 

## 2020-11-20 DIAGNOSIS — L570 Actinic keratosis: Secondary | ICD-10-CM | POA: Diagnosis not present

## 2020-11-20 DIAGNOSIS — L814 Other melanin hyperpigmentation: Secondary | ICD-10-CM | POA: Diagnosis not present

## 2020-11-20 DIAGNOSIS — L578 Other skin changes due to chronic exposure to nonionizing radiation: Secondary | ICD-10-CM | POA: Diagnosis not present

## 2020-11-20 DIAGNOSIS — D1801 Hemangioma of skin and subcutaneous tissue: Secondary | ICD-10-CM | POA: Diagnosis not present

## 2020-11-20 DIAGNOSIS — D225 Melanocytic nevi of trunk: Secondary | ICD-10-CM | POA: Diagnosis not present

## 2020-11-20 DIAGNOSIS — L821 Other seborrheic keratosis: Secondary | ICD-10-CM | POA: Diagnosis not present

## 2020-12-01 ENCOUNTER — Telehealth: Payer: Self-pay | Admitting: Internal Medicine

## 2020-12-01 NOTE — Telephone Encounter (Signed)
Pt c/o medication issue:  1. Name of Medication: atorvastatin (LIPITOR) 20 MG tablet  2. How are you currently taking this medication (dosage and times per day)? Take 20 mg by mouth daily.  3. Are you having a reaction (difficulty breathing--STAT)? yes  4. What is your medication issue? Pt is having occasional mild headache, runny nose, cough, weakness, shortness of breath, nausea

## 2020-12-01 NOTE — Telephone Encounter (Signed)
Called patient, he states he believes that atorvastatin is causing his symptoms- he has a headache, runny noes, weakness, shortness of breath, and nausea. He states that this all started in June- and has been going on since then. Nothing has improved. The atorvastatin was started back in April with no issues, the only new medication change was in June by [facility] office- they started Flecainide. He states he never thought about this, but it would fit the time frame. I was unsure if side effects would fit that medication but would check with PharmD to verify and if they thought it could contribute we could reach out to [facility] office as Dr.Acharya was out of office this week-  Patient verbalized understanding.

## 2020-12-02 NOTE — Telephone Encounter (Signed)
These side effects sound more like flecainide than atorvastatin.  Will forward to [name] for input

## 2020-12-04 NOTE — Telephone Encounter (Signed)
Left message to call back   (Per [place] -- Stop flecainide, start rhythmol 225 mg BID)

## 2020-12-05 MED ORDER — PROPAFENONE HCL ER 225 MG PO CP12: 225.0000 mg | ORAL_CAPSULE | Freq: Two times a day (BID) | ORAL | 3 refills | Status: DC

## 2020-12-05 NOTE — Telephone Encounter (Signed)
Spoke with the pt and he agreed to stop the Flecainide and start Rythmol... spoke with the pharmacist that will let me know if he can just transition right away or any considerations.

## 2020-12-05 NOTE — Telephone Encounter (Signed)
Left a message for the pt to call back.  

## 2020-12-05 NOTE — Telephone Encounter (Signed)
Spoke with the pt and he will stop the Flecainide today and start the Rythmol on Monday morning.

## 2020-12-05 NOTE — Telephone Encounter (Signed)
Patient is returning call.  °

## 2020-12-05 NOTE — Telephone Encounter (Signed)
Left a message for the pt:   Stop flecainide today and start the rhythmol on Monday

## 2020-12-09 ENCOUNTER — Telehealth: Payer: Self-pay | Admitting: Internal Medicine

## 2020-12-09 NOTE — Telephone Encounter (Signed)
We usually recommend against using azithromycin.  Patient has penicillin allergy listed, would suggest cephalosporin or clindamycin.  Will defer to EP or primary cardiologist.

## 2020-12-09 NOTE — Telephone Encounter (Signed)
Pt c/o medication issue:  1. Name of Medication:  propafenone (RYTHMOL SR) 225 MG 12 hr capsule  2. How are you currently taking this medication (dosage and times per day)?  As prescribed  3. Are you having a reaction (difficulty breathing--STAT)?  No   4. What is your medication issue?   Patient's wife states this morning the patient has oral surgery and was prescribed antibiotic, Azithromycin. They went to the pharmacy to pick it up, but the pharmacist made them aware of a level 2 drug interaction with the Propafenone. Patient's wife would like to know what he needs to do. Please advise.

## 2020-12-09 NOTE — Telephone Encounter (Signed)
Spoke to wife.  Wife states patient had oral surgery today. He has already taken 2 tablets  Azithromycin prior to surgery.  Patient is to take one tablet of Azithromycin for the next 4 days.  Wife patient is resting quietly.   Aware will defer to Dr [name] and [facility] pharmacist for instruction on how take  medication   -- interaction with    medications

## 2020-12-10 NOTE — Telephone Encounter (Signed)
Symptoms are somewhat vague and could easily be many things, including seasonal allergies.  Ok to stop atorvastatin and switch to rosuvastatin 10 mg once daily.

## 2020-12-10 NOTE — Telephone Encounter (Signed)
Patient had another question in regards to him taking  his medication  Atorvastatin   Patient states he has been having issues with runny nose  and headaches both comes and goes. He contribute these issue from  taking Atorvastatin.  Patient states he has done research and both  can occur  as well as smaller urine stream when urinating -( per patient he no longer has a prostate) Patient would like to know a different option or alternate medication.  Patient is aware will defer to Dr [name] and [facility] pharmacist

## 2020-12-10 NOTE — Telephone Encounter (Signed)
Called spoke to  patient  in regards to recommendation  from  K. Alvstad RPH and Dr Curt Bears (EP)  Patient wrote down recommended antibiotics.  Patient verbalized understanding the recommendation to take Cephalosporin or clindamycin. Patient states he will contact oral surgeon for change in antibiotics.

## 2020-12-11 MED ORDER — ROSUVASTATIN CALCIUM 10 MG PO TABS: 10.0000 mg | ORAL_TABLET | Freq: Every day | ORAL | 4 refills | Status: AC

## 2020-12-11 NOTE — Telephone Encounter (Signed)
Spoke to patient . Patient aware can stop Atorvastatin  . Start Rosuvastatin 10 mg daily   Patient would like a monthly supply to see if medication works for him. Patient will call if he would like a 90 day supply. Patient verbalized understanding.

## 2020-12-12 ENCOUNTER — Telehealth: Payer: Self-pay

## 2020-12-12 NOTE — Telephone Encounter (Signed)
Error

## 2020-12-15 ENCOUNTER — Telehealth: Payer: Self-pay | Admitting: Cardiology

## 2020-12-15 MED ORDER — METOPROLOL SUCCINATE ER 50 MG PO TB24: 50.0000 mg | ORAL_TABLET | Freq: Every day | ORAL | 3 refills | Status: DC

## 2020-12-15 NOTE — Telephone Encounter (Signed)
Received call back from Pt.  Confirmed propafenone medication instructions.  Confirmed Pt taking Toprol XL 50 mg daily.  Refil sent.

## 2020-12-15 NOTE — Telephone Encounter (Signed)
Pt c/o medication issue:  1. Name of Medication: flecainide, metoprolol   2. How are you currently taking this medication (dosage and times per day)? Not sure  3. Are you having a reaction (difficulty breathing--STAT)? No   4. What is your medication issue? Patient need to know which one dose he take. Please advise

## 2020-12-15 NOTE — Telephone Encounter (Signed)
Left message requesting  call back.

## 2020-12-16 ENCOUNTER — Ambulatory Visit: Payer: PPO | Admitting: Internal Medicine

## 2020-12-25 ENCOUNTER — Ambulatory Visit: Payer: PPO | Admitting: Cardiology

## 2020-12-25 ENCOUNTER — Other Ambulatory Visit: Payer: Self-pay

## 2020-12-25 ENCOUNTER — Encounter: Payer: Self-pay | Admitting: Cardiology

## 2020-12-25 VITALS — BP 128/88 | HR 68 | Ht 70.0 in | Wt 171.0 lb

## 2020-12-25 DIAGNOSIS — I493 Ventricular premature depolarization: Secondary | ICD-10-CM

## 2020-12-25 NOTE — Patient Instructions (Signed)
Medication Instructions:  Your physician has recommended you make the following change in your medication: STOP Propafenone  *If you need a refill on your cardiac medications before your next appointment, please call your pharmacy*   Lab Work: None ordered   Testing/Procedures: None ordered   Follow-Up: At [facility], you and your health needs are our priority.  As part of our continuing mission to provide you with exceptional heart care, we have created designated Provider Care Teams.  These Care Teams include your primary Cardiologist (physician) and Advanced Practice Providers (APPs -  Physician Assistants and Nurse Practitioners) who all work together to provide you with the care you need, when you need it.  We recommend signing up for the patient portal called "MyChart".  Sign up information is provided on this After Visit Summary.  MyChart is used to connect with patients for Virtual Visits (Telemedicine).  Patients are able to view lab/test results, encounter notes, upcoming appointments, etc.  Non-urgent messages can be sent to your provider as well.   To learn more about what you can do with MyChart, go to [email or web address].    Your next appointment:   3 month(s)  The format for your next appointment:   In Person  Provider:   Allegra Lai, MD    Thank you for choosing [facility]!!   Trinidad Curet, RN [phone]

## 2020-12-25 NOTE — Progress Notes (Signed)
Electrophysiology Office Note   Date:  12/25/2020   ID:  NOUMAN MALAGON, DOB [birth year], MRN [id number]  PCP:  [name], MD  Cardiologist:  [name] Primary Electrophysiologist:  Rossy Virag Meredith Leeds, MD    Chief Complaint: PVCs   History of Present Illness: Randall CONSENTINO is a 70 y.o. male who is being seen today for the evaluation of PVCs at the request of [name], MD. Presenting today for electrophysiology evaluation.  He has a history significant for arthritis and kidney stones.  He has no significant other past medical history.  He presented to a general cardiology with PVCs and palpitations.  He has a strong family history of coronary artery disease.  CT scan in 2019 showed coronary artery calcifications.  Palpitations developed January 2022.  He would feel a few beats then a short pause then a thump in his chest.  This worsened over the last several months.  He was started on metoprolol with improvement termination of the symptoms.  He is now on propafenone.  Today, denies symptoms of palpitations, chest pain, shortness of breath, orthopnea, PND, lower extremity edema, claudication, dizziness, presyncope, syncope, bleeding, or neurologic sequela. The patient is tolerating medications without difficulties.  Since being seen he has done better.  He initially tried flecainide for his arrhythmias, but unfortunately had side effects.  He was then put on propafenone.  Over the last couple weeks, he is felt well, back to how he did prior to his issues.  Unfortunately, his propafenone has caused a likely left bundle branch block.   Past Medical History:  Diagnosis Date   Allergic rhinitis    Prostate cancer ([place])    Past Surgical History:  Procedure Laterality Date   PROSTATE BIOPSY     SHOULDER SURGERY       Current Outpatient Medications  Medication Sig Dispense Refill   aspirin EC 81 MG tablet Take 1 tablet (81 mg total) by mouth daily. Swallow whole. 90  tablet 0   cetirizine (ZYRTEC) 10 MG tablet Take 10 mg by mouth daily.     metoprolol succinate (TOPROL-XL) 50 MG 24 hr tablet Take 1 tablet (50 mg total) by mouth daily. Take with or immediately following a meal. 90 tablet 3   omeprazole (PRILOSEC) 40 MG capsule Take 40 mg by mouth as needed.     rosuvastatin (CRESTOR) 10 MG tablet Take 1 tablet (10 mg total) by mouth daily. 30 tablet 4   No current facility-administered medications for this visit.    Allergies:   Amoxicillin-pot clavulanate, Atorvastatin, Plant derived enzymes, and Trichophyton   Social History:  The patient  reports that he has never smoked. He has never used smokeless tobacco. He reports current alcohol use. He reports that he does not use drugs.   Family History:  The patient's family history includes Diabetes in his father; Hypertension in his paternal grandmother; Prostate cancer (age of onset: 49) in his father; Prostate cancer (age of onset: 30) in his brother.   ROS:  Please see the history of present illness.   Otherwise, review of systems is positive for none.   All other systems are reviewed and negative.   PHYSICAL EXAM: VS:  BP 128/88   Pulse 68   Ht '5\' 10"'$  (1.778 m)   Wt 171 lb (77.6 kg)   SpO2 98%   BMI 24.54 kg/m  , BMI Body mass index is 24.54 kg/m. GEN: Well nourished, well developed, in no acute distress  HEENT: normal  Neck: no JVD, carotid bruits, or masses Cardiac: RRR; no murmurs, rubs, or gallops,no edema  Respiratory:  clear to auscultation bilaterally, normal work of breathing GI: soft, nontender, nondistended, + BS MS: no deformity or atrophy  Skin: warm and dry Neuro:  Strength and sensation are intact Psych: euthymic mood, full affect  EKG:  EKG is ordered today. Personal review of the ekg ordered shows sinus rhythm, left block, 68  Recent Labs: 08/08/2020: ALT 19; BUN 17; Creatinine, Ser 1.21; Potassium 4.5; Sodium 142    Lipid Panel  No results found for: CHOL, TRIG, HDL,  CHOLHDL, VLDL, LDLCALC, LDLDIRECT   Wt Readings from Last 3 Encounters:  12/25/20 171 lb (77.6 kg)  10/02/20 171 lb (77.6 kg)  09/09/20 171 lb (77.6 kg)      Other studies Reviewed: Additional studies/ records that were reviewed today include: Cardiac monitor 08/14/2020 personally reviewed Review of the above records today demonstrates:  Max 176 bpm 07:46am, 04/23 Min 33 bpm 05:53am, 04/22 Avg 74 bpm 21% supraventricular ectopy Rare ventricular ectopy Symptoms associated with ectopy  TTE 08/21/2020  1. Left ventricular ejection fraction, by estimation, is 60 to 65%. The  left ventricle has normal function. The left ventricle has no regional  wall motion abnormalities. Left ventricular diastolic parameters were  normal.   2. Right ventricular systolic function is normal. The right ventricular  size is normal. Tricuspid regurgitation signal is inadequate for assessing  PA pressure.   3. The mitral valve is normal in structure. Trivial mitral valve  regurgitation. No evidence of mitral stenosis.   4. The aortic valve is tricuspid. There is mild thickening of the aortic  valve. Aortic valve regurgitation is not visualized. No aortic stenosis is  present.   5. The inferior vena cava is normal in size with greater than 50%  respiratory variability, suggesting right atrial pressure of 3 mmHg.  Myoview 09/09/2020 The left ventricular ejection fraction is moderately decreased (30-44%). Nuclear stress EF: 41%. The study is normal. This is a low risk study.   ASSESSMENT AND PLAN:  1.  PACs/PVCs: Cardiac monitor with a 21% burden.  ECG shows obvious PACs with some PVCs.  Currently on Toprol-XL 50 mg daily and propafenone 225 mg twice daily.  Unfortunately he has had QRS widening on his propafenone.  Due to that, we Georgeanna Radziewicz stop the medication today.  He is feeling well without much in the way of PVCs.  We Akera Snowberger continue with his metoprolol and see him back in 3 months for potential  adjustments.  2.  Coronary artery calcifications: In a three-vessel distribution.  Further evaluation per primary cardiology.  3.  Hyperlipidemia: Continue Crestor 10 mg per primary cardiology.  Current medicines are reviewed at length with the patient today.   The patient does not have concerns regarding his medicines.  The following changes were made today: Stop propafenone  Labs/ tests ordered today include:  Orders Placed This Encounter  Procedures   EKG 12-Lead       Disposition:   FU with Acelin Ferdig 3 months  Signed, Alexy Bringle Meredith Leeds, MD  12/25/2020 3:49 PM     [place] [address] [place] [address] [phone] (office) [phone] (fax)

## 2021-01-12 DIAGNOSIS — J8410 Pulmonary fibrosis, unspecified: Secondary | ICD-10-CM | POA: Diagnosis not present

## 2021-01-12 DIAGNOSIS — J302 Other seasonal allergic rhinitis: Secondary | ICD-10-CM | POA: Diagnosis not present

## 2021-01-12 DIAGNOSIS — K219 Gastro-esophageal reflux disease without esophagitis: Secondary | ICD-10-CM | POA: Diagnosis not present

## 2021-01-12 DIAGNOSIS — E785 Hyperlipidemia, unspecified: Secondary | ICD-10-CM | POA: Diagnosis not present

## 2021-01-12 DIAGNOSIS — R972 Elevated prostate specific antigen [PSA]: Secondary | ICD-10-CM | POA: Diagnosis not present

## 2021-01-12 DIAGNOSIS — Z23 Encounter for immunization: Secondary | ICD-10-CM | POA: Diagnosis not present

## 2021-01-12 DIAGNOSIS — R002 Palpitations: Secondary | ICD-10-CM | POA: Diagnosis not present

## 2021-01-12 DIAGNOSIS — C61 Malignant neoplasm of prostate: Secondary | ICD-10-CM | POA: Diagnosis not present

## 2021-02-23 DIAGNOSIS — C61 Malignant neoplasm of prostate: Secondary | ICD-10-CM | POA: Diagnosis not present

## 2021-02-23 DIAGNOSIS — R351 Nocturia: Secondary | ICD-10-CM | POA: Diagnosis not present

## 2021-02-23 DIAGNOSIS — D400 Neoplasm of uncertain behavior of prostate: Secondary | ICD-10-CM | POA: Diagnosis not present

## 2021-03-24 ENCOUNTER — Ambulatory Visit (INDEPENDENT_AMBULATORY_CARE_PROVIDER_SITE_OTHER): Payer: PPO | Admitting: Cardiology

## 2021-03-24 ENCOUNTER — Other Ambulatory Visit: Payer: Self-pay

## 2021-03-24 ENCOUNTER — Encounter: Payer: Self-pay | Admitting: Cardiology

## 2021-03-24 VITALS — BP 122/64 | HR 60 | Ht 71.0 in | Wt 175.0 lb

## 2021-03-24 DIAGNOSIS — I493 Ventricular premature depolarization: Secondary | ICD-10-CM | POA: Diagnosis not present

## 2021-03-24 MED ORDER — MEXILETINE HCL 250 MG PO CAPS: 250.0000 mg | ORAL_CAPSULE | Freq: Two times a day (BID) | ORAL | 3 refills | Status: DC

## 2021-03-24 NOTE — Progress Notes (Signed)
Electrophysiology Office Note   Date:  03/24/2021   ID:  Kirt, Chew [birth year], MRN [id number]  PCP:  [name], MD  Cardiologist:  [name] Primary Electrophysiologist:  Verdis Koval Meredith Leeds, MD    Chief Complaint: PVCs   History of Present Illness: Randall Hodges is a 70 y.o. male who is being seen today for the evaluation of PVCs at the request of [name], MD. Presenting today for electrophysiology evaluation.  He has a history significant for arthritis and kidney stones.  He has no significant past cardiac history.  He presented general cardiology with PVCs and palpitations.  He has a strong family history of coronary artery disease.  CT scan in 2019 showed coronary artery calcifications.  Palpitation started January 2022.  He was started on propafenone.  Unfortunately had QRS widening and thus propafenone was stopped.  He did actually try flecainide but this was stopped due to side effects.  Today, denies symptoms of palpitations, chest pain, orthopnea, PND, lower extremity edema, claudication, dizziness, presyncope, syncope, bleeding, or neurologic sequela. The patient is tolerating medications without difficulties.  He is unaware of palpitations.  Despite that, he does have intermittent shortness of breath and fatigue.  He is unsure whether or not his PVCs are contributing to his shortness of breath or fatigue, though he does have an elevated burden.  He would like to try medication management to reduce his burden.   Past Medical History:  Diagnosis Date   Allergic rhinitis    Prostate cancer ([place])    Past Surgical History:  Procedure Laterality Date   PROSTATE BIOPSY     SHOULDER SURGERY       Current Outpatient Medications  Medication Sig Dispense Refill   aspirin EC 81 MG tablet Take 1 tablet (81 mg total) by mouth daily. Swallow whole. 90 tablet 0   cetirizine (ZYRTEC) 10 MG tablet Take 10 mg by mouth daily.     metoprolol succinate  (TOPROL-XL) 50 MG 24 hr tablet Take 1 tablet (50 mg total) by mouth daily. Take with or immediately following a meal. 90 tablet 3   mexiletine (MEXITIL) 250 MG capsule Take 1 capsule (250 mg total) by mouth 2 (two) times daily. 60 capsule 3   omeprazole (PRILOSEC) 40 MG capsule Take 40 mg by mouth as needed.     rosuvastatin (CRESTOR) 10 MG tablet Take 1 tablet (10 mg total) by mouth daily. 30 tablet 4   No current facility-administered medications for this visit.    Allergies:   Amoxicillin-pot clavulanate, Atorvastatin, Plant derived enzymes, and Trichophyton   Social History:  The patient  reports that he has never smoked. He has never used smokeless tobacco. He reports current alcohol use. He reports that he does not use drugs.   Family History:  The patient's family history includes Diabetes in his father; Hypertension in his paternal grandmother; Prostate cancer (age of onset: 1) in his father; Prostate cancer (age of onset: 3) in his brother.   ROS:  Please see the history of present illness.   Otherwise, review of systems is positive for none.   All other systems are reviewed and negative.   PHYSICAL EXAM: VS:  BP 122/64   Pulse 60   Ht 5\' 11"  (1.803 m)   Wt 175 lb (79.4 kg)   SpO2 99%   BMI 24.41 kg/m  , BMI Body mass index is 24.41 kg/m. GEN: Well nourished, well developed, in no acute distress  HEENT: normal  Neck: no JVD, carotid bruits, or masses Cardiac: RRR; no murmurs, rubs, or gallops,no edema  Respiratory:  clear to auscultation bilaterally, normal work of breathing GI: soft, nontender, nondistended, + BS MS: no deformity or atrophy  Skin: warm and dry Neuro:  Strength and sensation are intact Psych: euthymic mood, full affect  EKG:  EKG is ordered today. Personal review of the ekg ordered shows sinus rhythm, rate 60, PVCs  Recent Labs: 08/08/2020: ALT 19; BUN 17; Creatinine, Ser 1.21; Potassium 4.5; Sodium 142    Lipid Panel  No results found for: CHOL,  TRIG, HDL, CHOLHDL, VLDL, LDLCALC, LDLDIRECT   Wt Readings from Last 3 Encounters:  03/24/21 175 lb (79.4 kg)  12/25/20 171 lb (77.6 kg)  10/02/20 171 lb (77.6 kg)      Other studies Reviewed: Additional studies/ records that were reviewed today include: Cardiac monitor 08/14/2020 personally reviewed Review of the above records today demonstrates:  Max 176 bpm 07:46am, 04/23 Min 33 bpm 05:53am, 04/22 Avg 74 bpm 21% supraventricular ectopy Rare ventricular ectopy Symptoms associated with ectopy  TTE 08/21/2020  1. Left ventricular ejection fraction, by estimation, is 60 to 65%. The  left ventricle has normal function. The left ventricle has no regional  wall motion abnormalities. Left ventricular diastolic parameters were  normal.   2. Right ventricular systolic function is normal. The right ventricular  size is normal. Tricuspid regurgitation signal is inadequate for assessing  PA pressure.   3. The mitral valve is normal in structure. Trivial mitral valve  regurgitation. No evidence of mitral stenosis.   4. The aortic valve is tricuspid. There is mild thickening of the aortic  valve. Aortic valve regurgitation is not visualized. No aortic stenosis is  present.   5. The inferior vena cava is normal in size with greater than 50%  respiratory variability, suggesting right atrial pressure of 3 mmHg.  Myoview 09/09/2020 The left ventricular ejection fraction is moderately decreased (30-44%). Nuclear stress EF: 41%. The study is normal. This is a low risk study.   ASSESSMENT AND PLAN:  1.  PACs/PVCs: Cardiac monitor with a 21% burden.  ECGs with obvious PACs with some PVCs.  Currently on Toprol-XL 50 mg daily.  He had QRS widening on propafenone and thus the medication was stopped.  As he is continued to have symptoms, we Lanissa Cashen start him on mexiletine 250 mg twice daily.  We Labrian Torregrossa have him follow-up with EP app in 6 weeks.  If he does continue to have symptoms, sotalol would also be  a reasonable option.  2.  Coronary artery calcifications: Three-vessel distribution.  Further evaluation per primary cardiology.  3.  Hyperlipidemia: Continue Crestor 10 mg per primary cardiology.   Current medicines are reviewed at length with the patient today.   The patient does not have concerns regarding his medicines.  The following changes were made today: Start mexiletine  Labs/ tests ordered today include:  Orders Placed This Encounter  Procedures   EKG 12-Lead    Disposition:   FU with Spero Gunnels 6 months  Signed, Rondle Lohse Meredith Leeds, MD  03/24/2021 4:36 PM     [place] [address] [phone] (office) [phone] (fax)

## 2021-03-24 NOTE — Patient Instructions (Signed)
Medication Instructions:  Your physician has recommended you make the following change in your medication:  START Mexiletine 250 mg twice daily  *If you need a refill on your cardiac medications before your next appointment, please call your pharmacy*   Lab Work: None ordered   Testing/Procedures: None ordered   Follow-Up: At [facility], you and your health needs are our priority.  As part of our continuing mission to provide you with exceptional heart care, we have created designated Provider Care Teams.  These Care Teams include your primary Cardiologist (physician) and Advanced Practice Providers (APPs -  Physician Assistants and Nurse Practitioners) who all work together to provide you with the care you need, when you need it.  We recommend signing up for the patient portal called "MyChart".  Sign up information is provided on this After Visit Summary.  MyChart is used to connect with patients for Virtual Visits (Telemedicine).  Patients are able to view lab/test results, encounter notes, upcoming appointments, etc.  Non-urgent messages can be sent to your provider as well.   To learn more about what you can do with MyChart, go to [email or web address].    Your next appointment:   6 week(s)  The format for your next appointment:   In Person  Provider:   You will see one of the following Advanced Practice Providers on your designated Care Team:   Tommye Standard, [place] Legrand Como "Jonni Sanger" Chalmers Cater, [place]     Thank you for choosing [facility]!!   Trinidad Curet, RN [phone]   Other Instructions  Mexiletine capsules What is this medication? MEXILETINE (mex [place] e teen) is an antiarrhythmic agent. This medicine is used to treat irregular heart rhythm and can slow rapid heartbeats. It can help your heart to return to and maintain a normal rhythm. Because of the side effects caused by this medicine, it is usually used for heartbeat problems that may be  life-threatening. This medicine may be used for other purposes; ask your health care provider or pharmacist if you have questions. COMMON BRAND NAME(S): Mexitil What should I tell my care team before I take this medication? They need to know if you have any of these conditions: liver disease other heart problems previous heart attack an unusual or allergic reaction to mexiletine, other medicines, foods, dyes, or preservatives pregnant or trying to get pregnant breast-feeding How should I use this medication? Take this medicine by mouth with a glass of water. Follow the directions on the prescription label. It is recommended that you take this medicine with food or an antacid. Take your doses at regular intervals. Do not take your medicine more often than directed. Do not stop taking except on the advice of your doctor or health care professional. Talk to your pediatrician regarding the use of this medicine in children. Special care may be needed. Overdosage: If you think you have taken too much of this medicine contact a poison control center or emergency room at once. NOTE: This medicine is only for you. Do not share this medicine with others. What if I miss a dose? If you miss a dose, take it as soon as you can. If it is almost time for your next dose, take only that dose. Do not take double or extra doses. What may interact with this medication? Do not take this medicine with any of the following medications: dofetilide This medicine may also interact with the following medications: caffeine cimetidine medicines for depression, anxiety, or psychotic disturbances medicines to control  heart rhythm phenobarbital phenytoin rifampin theophylline This list may not describe all possible interactions. Give your health care provider a list of all the medicines, herbs, non-prescription drugs, or dietary supplements you use. Also tell them if you smoke, drink alcohol, or use illegal drugs. Some  items may interact with your medicine. What should I watch for while using this medication? Your condition will be monitored closely when you first begin therapy. Often, this drug is first started in a hospital or other monitored health care setting. Once you are on maintenance therapy, visit your doctor or health care provider for regular checks on your progress. Because your condition and use of this medicine carry some risk, it is a good idea to carry an identification card, necklace or bracelet with details of your condition, medications, and doctor or health care provider. You may get drowsy or dizzy. Do not drive, use machinery, or do anything that needs mental alertness until you know how this medicine affects you. Do not stand or sit up quickly, especially if you are an older patient. This reduces the risk of dizzy or fainting spells. Alcohol can make you more dizzy, increase flushing and rapid heartbeats. Avoid alcoholic drinks. This medicine may cause serious skin reactions. They can happen weeks to months after starting the medicine. Contact your health care provider right away if you notice fevers or flu-like symptoms with a rash. The rash may be red or purple and then turn into blisters or peeling of the skin. Or, you might notice a red rash with swelling of the face, lips or lymph nodes in your neck or under your arms. What side effects may I notice from receiving this medication? Side effects that you should report to your doctor or health care professional as soon as possible: allergic reactions like skin rash, itching or hives, swelling of the face, lips, or tongue breathing problems chest pain, continued irregular heartbeats rash, fever, and swollen lymph nodes redness, blistering, peeling or loosening of the skin, including inside the mouth seizures skin rash trembling, shaking unusual bleeding or bruising unusually weak or tired Side effects that usually do not require medical  attention (report to your doctor or health care professional if they continue or are bothersome): blurred vision difficulty walking heartburn nausea, vomiting nervousness numbness, or tingling in the fingers or toes This list may not describe all possible side effects. Call your doctor for medical advice about side effects. You may report side effects to FDA at 1-800-FDA-1088. Where should I keep my medication? Keep out of reach of children. Store at room temperature between 15 and 30 degrees C (59 and 86 degrees F). Throw away any unused medicine after the expiration date. NOTE: This sheet is a summary. It may not cover all possible information. If you have questions about this medicine, talk to your doctor, pharmacist, or health care provider.  2022 Elsevier/Gold Standard (2018-07-13 00:00:00)

## 2021-03-31 ENCOUNTER — Telehealth: Payer: Self-pay | Admitting: Cardiology

## 2021-03-31 NOTE — Telephone Encounter (Signed)
° °  Pt c/o medication issue:  1. Name of Medication: mexiletine (MEXITIL) 250 MG capsule  2. How are you currently taking this medication (dosage and times per day)? Take 1 capsule (250 mg total) by mouth 2 (two) times daily.  3. Are you having a reaction (difficulty breathing--STAT)?   4. What is your medication issue? Pt said since he said start taking this med he was not feeling good and his BP and HR was elevated, he stopped taking this meds since Sunday afternoon. His BP and HR did not improve up until today, it is still elevated

## 2021-03-31 NOTE — Telephone Encounter (Signed)
Pt reports that he started taking the Mexiletine last Friday. On Saturday night/early Sunday morning his HR was running 80 bpm, BP 160/85 along with mild HA. Reports baseline pulse is 60s - mid 70s. Reports that he took his last dose of Mexiletine Sunday afternoon after just not feeling well on this new medication. He feels like he has "stabilized" since stopping the medication Sunday. BP today 140/84. Pt also stopped Toprol last week. He is open to staying off medication and see how he does. He likes feeling better.  Aware that [name] may be agreeable to holding medications.  But explained that he will most likely need a monitor to determine PVC burden prior to making any decision about if safe to remain off medication/s. Forwarding to MD for review/advisement. Patient verbalized understanding and agreeable to plan.

## 2021-04-07 DIAGNOSIS — R0981 Nasal congestion: Secondary | ICD-10-CM | POA: Diagnosis not present

## 2021-04-07 DIAGNOSIS — R519 Headache, unspecified: Secondary | ICD-10-CM | POA: Diagnosis not present

## 2021-04-07 DIAGNOSIS — J8410 Pulmonary fibrosis, unspecified: Secondary | ICD-10-CM | POA: Diagnosis not present

## 2021-04-07 DIAGNOSIS — R051 Acute cough: Secondary | ICD-10-CM | POA: Diagnosis not present

## 2021-04-07 DIAGNOSIS — R509 Fever, unspecified: Secondary | ICD-10-CM | POA: Diagnosis not present

## 2021-04-07 DIAGNOSIS — J069 Acute upper respiratory infection, unspecified: Secondary | ICD-10-CM | POA: Diagnosis not present

## 2021-04-07 DIAGNOSIS — R0602 Shortness of breath: Secondary | ICD-10-CM | POA: Diagnosis not present

## 2021-04-07 DIAGNOSIS — Z1152 Encounter for screening for COVID-19: Secondary | ICD-10-CM | POA: Diagnosis not present

## 2021-04-23 ENCOUNTER — Telehealth: Payer: Self-pay | Admitting: Internal Medicine

## 2021-04-23 ENCOUNTER — Telehealth: Payer: Self-pay | Admitting: Cardiology

## 2021-04-23 NOTE — Telephone Encounter (Signed)
error 

## 2021-04-23 NOTE — Telephone Encounter (Signed)
Patient c/o Palpitations:  High priority if patient c/o lightheadedness, shortness of breath, or chest pain  How long have you had palpitations/irregular HR/ Afib? Are you having the symptoms now? Yes   Are you currently experiencing lightheadedness, SOB or CP? Uneasiness, headaches, heart pounding, chest soreness  Do you have a history of afib (atrial fibrillation) or irregular heart rhythm? Yes   Have you checked your BP or HR? (document readings if available): 153/87;73  Are you experiencing any other symptoms?  Uneasiness, headaches, heart pounding, chest soreness

## 2021-04-23 NOTE — Telephone Encounter (Signed)
Spoke with pt he states "I have had this feeling since I seen [name]."  Pt states in December he stopped taking medications  because he was having really high heart rate and high blood pressure. Now it is not as bad, I still do have chest soreness and heart pounding. Past blood pressure was 118-122/68-72 normally. Today it was 134/80s. Pt has an appt with [name] on 1/20th they suggested reaching out to this office to get evaluated for "pluming issues". Pt made aware [name] is out of the office currently and we will get him set up with an appt. Pt has significant cardiac family history.  "I do not want to go back on any mediation for electrical issues. I think it is a pluming/circulatory issue." Stopped: Mexiletine 250 mg once daily  "I took it for 3 days and it caused all these issues." Stopped: Metoprolol succinate 50 mg once daily "Per Dr. Shonna Chock recommendation to stop. (Changed to Mexiletine)" Pt added to DOD schedule to be seen soon.

## 2021-04-23 NOTE — Telephone Encounter (Signed)
Spent 46 minutes on phone with pt. Pt is still not taking Mexiletine or Toprol and feels good. Pt is still very concerned about family hx and what further testing needs to occur in relation to his atherosclerosis. Aware that he can discuss more testing needs w/ [name], but we can also address at [facility].   Pt aware I will discuss a monitor prior to follow up scheduled for 1/20. Will leave detailed message tomorrow if he does not answer, pt is agreeable to plan. Advised to call with any changes prior to OV scheduled in couple of weeks.

## 2021-04-30 ENCOUNTER — Ambulatory Visit (INDEPENDENT_AMBULATORY_CARE_PROVIDER_SITE_OTHER): Payer: PPO | Admitting: Cardiology

## 2021-04-30 ENCOUNTER — Encounter: Payer: Self-pay | Admitting: Cardiology

## 2021-04-30 ENCOUNTER — Other Ambulatory Visit: Payer: Self-pay

## 2021-04-30 VITALS — BP 146/80 | HR 67 | Ht 71.0 in | Wt 175.4 lb

## 2021-04-30 DIAGNOSIS — R079 Chest pain, unspecified: Secondary | ICD-10-CM

## 2021-04-30 MED ORDER — METOPROLOL SUCCINATE ER 50 MG PO TB24
50.0000 mg | ORAL_TABLET | Freq: Every day | ORAL | 3 refills | Status: DC
Start: 2021-04-30 — End: 2021-05-04

## 2021-04-30 MED ORDER — METOPROLOL TARTRATE 100 MG PO TABS: 100.0000 mg | ORAL_TABLET | Freq: Two times a day (BID) | ORAL | 3 refills | Status: AC

## 2021-04-30 NOTE — Progress Notes (Signed)
Cardiology Office Note   Date:  05/01/2021   ID:  Randall Hodges, DOB 02-09-1951, MRN [id number]  PCP:  [name], MD  Cardiologist:   [name], MD   Chief Complaint  Patient presents with   Chest Pain      History of Present Illness: Randall Hodges is a 71 y.o. male who presents for evaluation of chest pain.  He has seen [name] most recently early last month.  He has had palpitations and PVCs.  He does have a history of coronary disease with coronary artery calcifications.  Other evaluation has included a transthoracic echo with an EF of 65% in May 2022.  There were no significant valvular abnormalities.  He had a stress perfusion study in May demonstrated no evidence of ischemia although it did suggest a lower ejection fraction on the echo confirmed.  He had 21% burden of ectopy on the monitor.  He was treated with propafenone but had QRS widening.  He has been treated with beta-blockers.  He was sent by [name] to EP and he was treated with mexiletine.  He called the other day with chest soreness.  He had stopped his mexiletine.  He stopped his metoprolol.  He said that he describes the chest soreness that he is there most of the time.  He thinks it is more intense than previous.  It is not associated with palpitations.  He actually thinks the palpitations are improved.  He says are about two thirds worse than they were.  He is not describing any associated nausea vomiting or diaphoresis.  He has not had any presyncope or syncope.  He describes the chest discomfort as a squeezing.  He is also had some slightly elevated blood pressures in the 140s over 80s.  He thinks he gets a little headache with this.   Past Medical History:  Diagnosis Date   Allergic rhinitis    Prostate cancer ([place])     Past Surgical History:  Procedure Laterality Date   PROSTATE BIOPSY     SHOULDER SURGERY       Current Outpatient Medications  Medication Sig Dispense Refill    albuterol (VENTOLIN HFA) 108 (90 Base) MCG/ACT inhaler SMARTSIG:1-2 Puff(s) Via Inhaler Every 4 Hours PRN     aspirin EC 81 MG tablet Take 1 tablet (81 mg total) by mouth daily. Swallow whole. 90 tablet 0   cetirizine (ZYRTEC) 10 MG tablet Take 10 mg by mouth daily.     metoprolol succinate (TOPROL-XL) 50 MG 24 hr tablet Take 1 tablet (50 mg total) by mouth daily. Take with or immediately following a meal. 90 tablet 3   metoprolol tartrate (LOPRESSOR) 100 MG tablet Take 1 tablet (100 mg total) by mouth 2 (two) times daily. 180 tablet 3   omeprazole (PRILOSEC) 40 MG capsule Take 40 mg by mouth as needed.     rosuvastatin (CRESTOR) 10 MG tablet Take 1 tablet (10 mg total) by mouth daily. 30 tablet 4   No current facility-administered medications for this visit.    Allergies:   Amoxicillin-pot clavulanate, Atorvastatin, Plant derived enzymes, and Trichophyton      ROS:  Please see the history of present illness.   Otherwise, review of systems are positive for none.   All other systems are reviewed and negative.    PHYSICAL EXAM: VS:  BP (!) 146/80    Pulse 67    Ht 5\' 11"  (1.803 m)    Wt 175  lb 6.4 oz (79.6 kg)    SpO2 99%    BMI 24.46 kg/m  , BMI Body mass index is 24.46 kg/m. GENERAL:  Well appearing HEENT:  Pupils equal round and reactive, fundi not visualized, oral mucosa unremarkable NECK:  No jugular venous distention, waveform within normal limits, carotid upstroke brisk and symmetric, no bruits, no thyromegaly LYMPHATICS:  No cervical, inguinal adenopathy LUNGS:  Clear to auscultation bilaterally BACK:  No CVA tenderness CHEST:  Unremarkable HEART:  PMI not displaced or sustained,S1 and S2 within normal limits, no S3, no S4, no clicks, no rubs, no murmurs ABD:  Flat, positive bowel sounds normal in frequency in pitch, no bruits, no rebound, no guarding, no midline pulsatile mass, no hepatomegaly, no splenomegaly EXT:  2 plus pulses throughout, no edema, no cyanosis no  clubbing SKIN:  No rashes no nodules NEURO:  Cranial nerves II through XII grossly intact, motor grossly intact throughout PSYCH:  Cognitively intact, oriented to person place and time    EKG:  EKG is ordered today. The ekg ordered today demonstrates sinus rhythm, rate 68, premature ectopic complexes, no acute ST-T wave changes.   Recent Labs: 08/08/2020: ALT 19; BUN 17; Creatinine, Ser 1.21; Potassium 4.5; Sodium 142    Lipid Panel No results found for: CHOL, TRIG, HDL, CHOLHDL, VLDL, LDLCALC, LDLDIRECT    Wt Readings from Last 3 Encounters:  04/30/21 175 lb 6.4 oz (79.6 kg)  03/24/21 175 lb (79.4 kg)  12/25/20 171 lb (77.6 kg)      Other studies Reviewed: Additional studies/ records that were reviewed today include: Previous EP records.  Previous cardiac studies.. Review of the above records demonstrates:  Please see elsewhere in the note.     ASSESSMENT AND PLAN:  Chest discomfort: Chest discomfort is somewhat concerning.  He had a negative perfusion study but he had three-vessel coronary calcium.  I think it is reasonable to try to image him with CT coronary angiography.  I think his ectopy is less than it was previously we can probably get his heart rate slowed down.  I discussed the potential limitations of ectopy with angiography and we will try to heavily beta-blocking.  He will restart his beta-blocker metoprolol at 50 mg and also be given beta-blocker per protocol for his study.  HTN: Again I have suggested that he restart his beta-blocker.  He is reluctant to do any other medications right now but he can keep an eye on his blood pressure diary.  Ectopy: He is to be seen by [name] later in the month.  He wants to remain off the mexiletine and likely would not want another antiarrhythmic at this point.  However, he will have this discussion.   Current medicines are reviewed at length with the patient today.  The patient does not have concerns regarding  medicines.  The following changes have been made:  no change  Labs/ tests ordered today include:   Orders Placed This Encounter  Procedures   CT CORONARY MORPH W/CTA COR W/SCORE W/CA W/CM &/OR WO/CM   Basic metabolic panel   EKG 02-[id number]     Disposition:   FU with [name]    Signed, Minus Breeding, MD  05/01/2021 7:25 AM    [place]

## 2021-04-30 NOTE — Patient Instructions (Signed)
Medication Instructions:  Take metoprolol succinate 50 mg each day.  *If you need a refill on your cardiac medications before your next appointment, please call your pharmacy*   Lab Work: Return for BMET If you have labs (blood work) drawn today and your tests are completely normal, you will receive your results only by: [place] (if you have MyChart) OR A paper copy in the mail If you have any lab test that is abnormal or we need to change your treatment, we will call you to review the results.   Testing/Procedures:   Your cardiac CT will be scheduled at one of the below locations:   [facility] [address] [place], [place] [address] [phone]   If scheduled at [facility], please arrive at the [facility] main entrance (entrance A) of [facility] 30 minutes prior to test start time. You can use the FREE valet parking offered at the main entrance (encouraged to control the heart rate for the test) Proceed to the [facility] Radiology Department (first floor) to check-in and test prep.  Please follow these instructions carefully (unless otherwise directed):  Hold all erectile dysfunction medications at least 3 days (72 hrs) prior to test.  On the Night Before the Test: Be sure to Drink plenty of water. Do not consume any caffeinated/decaffeinated beverages or chocolate 12 hours prior to your test. Do not take any antihistamines 12 hours prior to your test. If the patient has contrast allergy: Patient will need a prescription for Prednisone and very clear instructions (as follows): Prednisone 50 mg - take 13 hours prior to test Take another Prednisone 50 mg 7 hours prior to test Take another Prednisone 50 mg 1 hour prior to test Take Benadryl 50 mg 1 hour prior to test Patient must complete all four doses of above prophylactic medications. Patient will need a ride after test due to Benadryl.  On the Day of the Test: Drink plenty  of water until 1 hour prior to the test. Do not eat any food 4 hours prior to the test. You may take your regular medications prior to the test.  Take metoprolol (Lopressor) 1oo mg two hours prior to test.       After the Test: Drink plenty of water. After receiving IV contrast, you may experience a mild flushed feeling. This is normal. On occasion, you may experience a mild rash up to 24 hours after the test. This is not dangerous. If this occurs, you can take Benadryl 25 mg and increase your fluid intake. If you experience trouble breathing, this can be serious. If it is severe call 911 IMMEDIATELY. If it is mild, please call our office. If you take any of these medications: Glipizide/Metformin, Avandament, Glucavance, please do not take 48 hours after completing test unless otherwise instructed.  Please allow 2-4 weeks for scheduling of routine cardiac CTs. Some insurance companies require a pre-authorization which may delay scheduling of this test.   For non-scheduling related questions, please contact the cardiac imaging nurse navigator should you have any questions/concerns: Marchia Bond, Cardiac Imaging Nurse Navigator Gordy Clement, Cardiac Imaging Nurse Navigator [facility] Direct Office Dial: [phone]   For scheduling needs, including cancellations and rescheduling, please call [place], [phone].   Follow-Up: At [facility], you and your health needs are our priority.  As part of our continuing mission to provide you with exceptional heart care, we have created designated Provider Care Teams.  These  Care Teams include your primary Cardiologist (physician) and Advanced Practice Providers (APPs -  Physician Assistants and Nurse Practitioners) who all work together to provide you with the care you need, when you need it.  We recommend signing up for the patient portal called "MyChart".  Sign up information is provided on this After Visit  Summary.  MyChart is used to connect with patients for Virtual Visits (Telemedicine).  Patients are able to view lab/test results, encounter notes, upcoming appointments, etc.  Non-urgent messages can be sent to your provider as well.   To learn more about what you can do with MyChart, go to [email or web address].    Your next appointment:    After CCTA  The format for your next appointment:   In Person  Provider:   [name], MD

## 2021-05-01 ENCOUNTER — Encounter: Payer: Self-pay | Admitting: Cardiology

## 2021-05-04 ENCOUNTER — Telehealth: Payer: Self-pay

## 2021-05-04 NOTE — Telephone Encounter (Signed)
Spoke to patient Dr.Hochrein advised you do not need a monitor prior to your appointment with [facility] 05/08/21 at 2:30 pm.Stated he is starting on Metoprolol tartrate 100 mg twice a day today.He no longer is taking Metoprolol Succinate.

## 2021-05-05 ENCOUNTER — Inpatient Hospital Stay (HOSPITAL_COMMUNITY): Payer: PPO

## 2021-05-05 ENCOUNTER — Other Ambulatory Visit: Payer: Self-pay

## 2021-05-05 ENCOUNTER — Encounter (HOSPITAL_BASED_OUTPATIENT_CLINIC_OR_DEPARTMENT_OTHER): Payer: Self-pay

## 2021-05-05 ENCOUNTER — Telehealth: Payer: Self-pay | Admitting: Internal Medicine

## 2021-05-05 ENCOUNTER — Inpatient Hospital Stay (HOSPITAL_COMMUNITY): Admission: EM | Disposition: E | Payer: Self-pay | Source: Home / Self Care | Attending: Cardiothoracic Surgery

## 2021-05-05 ENCOUNTER — Emergency Department (HOSPITAL_BASED_OUTPATIENT_CLINIC_OR_DEPARTMENT_OTHER): Payer: PPO | Admitting: Radiology

## 2021-05-05 ENCOUNTER — Ambulatory Visit (HOSPITAL_COMMUNITY): Admit: 2021-05-05 | Payer: PPO | Admitting: Interventional Cardiology

## 2021-05-05 ENCOUNTER — Inpatient Hospital Stay (HOSPITAL_BASED_OUTPATIENT_CLINIC_OR_DEPARTMENT_OTHER)
Admission: EM | Admit: 2021-05-05 | Discharge: 2021-07-18 | DRG: 003 | Disposition: E | Payer: PPO | Attending: Cardiothoracic Surgery | Admitting: Cardiothoracic Surgery

## 2021-05-05 ENCOUNTER — Emergency Department (HOSPITAL_BASED_OUTPATIENT_CLINIC_OR_DEPARTMENT_OTHER): Payer: PPO

## 2021-05-05 DIAGNOSIS — R1900 Intra-abdominal and pelvic swelling, mass and lump, unspecified site: Secondary | ICD-10-CM | POA: Diagnosis not present

## 2021-05-05 DIAGNOSIS — M47816 Spondylosis without myelopathy or radiculopathy, lumbar region: Secondary | ICD-10-CM | POA: Diagnosis not present

## 2021-05-05 DIAGNOSIS — Z452 Encounter for adjustment and management of vascular access device: Secondary | ICD-10-CM

## 2021-05-05 DIAGNOSIS — Z951 Presence of aortocoronary bypass graft: Secondary | ICD-10-CM | POA: Diagnosis not present

## 2021-05-05 DIAGNOSIS — N200 Calculus of kidney: Secondary | ICD-10-CM | POA: Diagnosis not present

## 2021-05-05 DIAGNOSIS — R0902 Hypoxemia: Secondary | ICD-10-CM

## 2021-05-05 DIAGNOSIS — I491 Atrial premature depolarization: Secondary | ICD-10-CM | POA: Diagnosis not present

## 2021-05-05 DIAGNOSIS — Z930 Tracheostomy status: Secondary | ICD-10-CM

## 2021-05-05 DIAGNOSIS — J189 Pneumonia, unspecified organism: Secondary | ICD-10-CM | POA: Diagnosis not present

## 2021-05-05 DIAGNOSIS — E785 Hyperlipidemia, unspecified: Secondary | ICD-10-CM | POA: Diagnosis present

## 2021-05-05 DIAGNOSIS — I200 Unstable angina: Secondary | ICD-10-CM | POA: Diagnosis not present

## 2021-05-05 DIAGNOSIS — K567 Ileus, unspecified: Secondary | ICD-10-CM

## 2021-05-05 DIAGNOSIS — R1310 Dysphagia, unspecified: Secondary | ICD-10-CM | POA: Diagnosis not present

## 2021-05-05 DIAGNOSIS — G9341 Metabolic encephalopathy: Secondary | ICD-10-CM | POA: Diagnosis not present

## 2021-05-05 DIAGNOSIS — E870 Hyperosmolality and hypernatremia: Secondary | ICD-10-CM | POA: Diagnosis not present

## 2021-05-05 DIAGNOSIS — F05 Delirium due to known physiological condition: Secondary | ICD-10-CM | POA: Diagnosis not present

## 2021-05-05 DIAGNOSIS — K5900 Constipation, unspecified: Secondary | ICD-10-CM | POA: Diagnosis not present

## 2021-05-05 DIAGNOSIS — R066 Hiccough: Secondary | ICD-10-CM

## 2021-05-05 DIAGNOSIS — E871 Hypo-osmolality and hyponatremia: Secondary | ICD-10-CM | POA: Diagnosis not present

## 2021-05-05 DIAGNOSIS — D689 Coagulation defect, unspecified: Secondary | ICD-10-CM | POA: Diagnosis not present

## 2021-05-05 DIAGNOSIS — I484 Atypical atrial flutter: Secondary | ICD-10-CM | POA: Diagnosis not present

## 2021-05-05 DIAGNOSIS — Z880 Allergy status to penicillin: Secondary | ICD-10-CM

## 2021-05-05 DIAGNOSIS — Z09 Encounter for follow-up examination after completed treatment for conditions other than malignant neoplasm: Secondary | ICD-10-CM | POA: Diagnosis not present

## 2021-05-05 DIAGNOSIS — I361 Nonrheumatic tricuspid (valve) insufficiency: Secondary | ICD-10-CM | POA: Diagnosis not present

## 2021-05-05 DIAGNOSIS — R918 Other nonspecific abnormal finding of lung field: Secondary | ICD-10-CM | POA: Diagnosis not present

## 2021-05-05 DIAGNOSIS — R001 Bradycardia, unspecified: Secondary | ICD-10-CM | POA: Diagnosis not present

## 2021-05-05 DIAGNOSIS — E1165 Type 2 diabetes mellitus with hyperglycemia: Secondary | ICD-10-CM | POA: Diagnosis not present

## 2021-05-05 DIAGNOSIS — R0602 Shortness of breath: Secondary | ICD-10-CM | POA: Diagnosis not present

## 2021-05-05 DIAGNOSIS — R443 Hallucinations, unspecified: Secondary | ICD-10-CM | POA: Diagnosis not present

## 2021-05-05 DIAGNOSIS — I480 Paroxysmal atrial fibrillation: Secondary | ICD-10-CM | POA: Diagnosis not present

## 2021-05-05 DIAGNOSIS — Z20822 Contact with and (suspected) exposure to covid-19: Secondary | ICD-10-CM | POA: Diagnosis not present

## 2021-05-05 DIAGNOSIS — I472 Ventricular tachycardia, unspecified: Secondary | ICD-10-CM | POA: Diagnosis not present

## 2021-05-05 DIAGNOSIS — I249 Acute ischemic heart disease, unspecified: Secondary | ICD-10-CM | POA: Diagnosis not present

## 2021-05-05 DIAGNOSIS — Z9889 Other specified postprocedural states: Secondary | ICD-10-CM | POA: Diagnosis not present

## 2021-05-05 DIAGNOSIS — J439 Emphysema, unspecified: Secondary | ICD-10-CM | POA: Diagnosis not present

## 2021-05-05 DIAGNOSIS — Z4659 Encounter for fitting and adjustment of other gastrointestinal appliance and device: Secondary | ICD-10-CM

## 2021-05-05 DIAGNOSIS — Z7189 Other specified counseling: Secondary | ICD-10-CM | POA: Diagnosis not present

## 2021-05-05 DIAGNOSIS — K6389 Other specified diseases of intestine: Secondary | ICD-10-CM | POA: Diagnosis not present

## 2021-05-05 DIAGNOSIS — R0603 Acute respiratory distress: Secondary | ICD-10-CM | POA: Diagnosis not present

## 2021-05-05 DIAGNOSIS — Z978 Presence of other specified devices: Secondary | ICD-10-CM | POA: Diagnosis not present

## 2021-05-05 DIAGNOSIS — J9601 Acute respiratory failure with hypoxia: Secondary | ICD-10-CM

## 2021-05-05 DIAGNOSIS — J690 Pneumonitis due to inhalation of food and vomit: Secondary | ICD-10-CM | POA: Diagnosis not present

## 2021-05-05 DIAGNOSIS — I3139 Other pericardial effusion (noninflammatory): Secondary | ICD-10-CM | POA: Diagnosis not present

## 2021-05-05 DIAGNOSIS — Z9281 Personal history of extracorporeal membrane oxygenation (ECMO): Secondary | ICD-10-CM | POA: Diagnosis not present

## 2021-05-05 DIAGNOSIS — I340 Nonrheumatic mitral (valve) insufficiency: Secondary | ICD-10-CM | POA: Diagnosis not present

## 2021-05-05 DIAGNOSIS — J8410 Pulmonary fibrosis, unspecified: Secondary | ICD-10-CM | POA: Diagnosis present

## 2021-05-05 DIAGNOSIS — Z833 Family history of diabetes mellitus: Secondary | ICD-10-CM

## 2021-05-05 DIAGNOSIS — D696 Thrombocytopenia, unspecified: Secondary | ICD-10-CM | POA: Diagnosis not present

## 2021-05-05 DIAGNOSIS — R6521 Severe sepsis with septic shock: Secondary | ICD-10-CM | POA: Diagnosis not present

## 2021-05-05 DIAGNOSIS — L8990 Pressure ulcer of unspecified site, unspecified stage: Secondary | ICD-10-CM | POA: Insufficient documentation

## 2021-05-05 DIAGNOSIS — J80 Acute respiratory distress syndrome: Secondary | ICD-10-CM | POA: Diagnosis not present

## 2021-05-05 DIAGNOSIS — J9584 Transfusion-related acute lung injury (TRALI): Secondary | ICD-10-CM

## 2021-05-05 DIAGNOSIS — I5021 Acute systolic (congestive) heart failure: Secondary | ICD-10-CM | POA: Diagnosis not present

## 2021-05-05 DIAGNOSIS — J9382 Other air leak: Secondary | ICD-10-CM | POA: Diagnosis not present

## 2021-05-05 DIAGNOSIS — Z79899 Other long term (current) drug therapy: Secondary | ICD-10-CM

## 2021-05-05 DIAGNOSIS — R531 Weakness: Secondary | ICD-10-CM | POA: Diagnosis not present

## 2021-05-05 DIAGNOSIS — Z4682 Encounter for fitting and adjustment of non-vascular catheter: Secondary | ICD-10-CM | POA: Diagnosis not present

## 2021-05-05 DIAGNOSIS — I517 Cardiomegaly: Secondary | ICD-10-CM | POA: Diagnosis not present

## 2021-05-05 DIAGNOSIS — Z8249 Family history of ischemic heart disease and other diseases of the circulatory system: Secondary | ICD-10-CM

## 2021-05-05 DIAGNOSIS — I2510 Atherosclerotic heart disease of native coronary artery without angina pectoris: Secondary | ICD-10-CM

## 2021-05-05 DIAGNOSIS — J849 Interstitial pulmonary disease, unspecified: Secondary | ICD-10-CM | POA: Diagnosis not present

## 2021-05-05 DIAGNOSIS — K219 Gastro-esophageal reflux disease without esophagitis: Secondary | ICD-10-CM | POA: Diagnosis present

## 2021-05-05 DIAGNOSIS — D62 Acute posthemorrhagic anemia: Secondary | ICD-10-CM | POA: Diagnosis not present

## 2021-05-05 DIAGNOSIS — A419 Sepsis, unspecified organism: Secondary | ICD-10-CM | POA: Diagnosis not present

## 2021-05-05 DIAGNOSIS — Z01810 Encounter for preprocedural cardiovascular examination: Secondary | ICD-10-CM | POA: Diagnosis not present

## 2021-05-05 DIAGNOSIS — I213 ST elevation (STEMI) myocardial infarction of unspecified site: Secondary | ICD-10-CM

## 2021-05-05 DIAGNOSIS — J9602 Acute respiratory failure with hypercapnia: Secondary | ICD-10-CM

## 2021-05-05 DIAGNOSIS — R739 Hyperglycemia, unspecified: Secondary | ICD-10-CM | POA: Diagnosis not present

## 2021-05-05 DIAGNOSIS — I25110 Atherosclerotic heart disease of native coronary artery with unstable angina pectoris: Secondary | ICD-10-CM | POA: Diagnosis not present

## 2021-05-05 DIAGNOSIS — J9611 Chronic respiratory failure with hypoxia: Secondary | ICD-10-CM | POA: Diagnosis not present

## 2021-05-05 DIAGNOSIS — Z8546 Personal history of malignant neoplasm of prostate: Secondary | ICD-10-CM

## 2021-05-05 DIAGNOSIS — I4892 Unspecified atrial flutter: Secondary | ICD-10-CM | POA: Diagnosis not present

## 2021-05-05 DIAGNOSIS — I509 Heart failure, unspecified: Secondary | ICD-10-CM

## 2021-05-05 DIAGNOSIS — I311 Chronic constrictive pericarditis: Secondary | ICD-10-CM | POA: Diagnosis not present

## 2021-05-05 DIAGNOSIS — Z66 Do not resuscitate: Secondary | ICD-10-CM | POA: Diagnosis not present

## 2021-05-05 DIAGNOSIS — N179 Acute kidney failure, unspecified: Secondary | ICD-10-CM | POA: Diagnosis not present

## 2021-05-05 DIAGNOSIS — Z515 Encounter for palliative care: Secondary | ICD-10-CM | POA: Diagnosis not present

## 2021-05-05 DIAGNOSIS — Z7982 Long term (current) use of aspirin: Secondary | ICD-10-CM

## 2021-05-05 DIAGNOSIS — I10 Essential (primary) hypertension: Secondary | ICD-10-CM | POA: Diagnosis not present

## 2021-05-05 DIAGNOSIS — J9690 Respiratory failure, unspecified, unspecified whether with hypoxia or hypercapnia: Secondary | ICD-10-CM

## 2021-05-05 DIAGNOSIS — I110 Hypertensive heart disease with heart failure: Secondary | ICD-10-CM | POA: Diagnosis present

## 2021-05-05 DIAGNOSIS — E860 Dehydration: Secondary | ICD-10-CM | POA: Diagnosis not present

## 2021-05-05 DIAGNOSIS — J449 Chronic obstructive pulmonary disease, unspecified: Secondary | ICD-10-CM | POA: Diagnosis not present

## 2021-05-05 DIAGNOSIS — Z888 Allergy status to other drugs, medicaments and biological substances status: Secondary | ICD-10-CM

## 2021-05-05 DIAGNOSIS — I4729 Other ventricular tachycardia: Secondary | ICD-10-CM | POA: Diagnosis not present

## 2021-05-05 DIAGNOSIS — I318 Other specified diseases of pericardium: Secondary | ICD-10-CM | POA: Diagnosis not present

## 2021-05-05 DIAGNOSIS — I32 Pericarditis in diseases classified elsewhere: Secondary | ICD-10-CM | POA: Diagnosis not present

## 2021-05-05 DIAGNOSIS — J90 Pleural effusion, not elsewhere classified: Secondary | ICD-10-CM

## 2021-05-05 DIAGNOSIS — I25119 Atherosclerotic heart disease of native coronary artery with unspecified angina pectoris: Secondary | ICD-10-CM | POA: Diagnosis not present

## 2021-05-05 DIAGNOSIS — Z9911 Dependence on respirator [ventilator] status: Secondary | ICD-10-CM | POA: Diagnosis not present

## 2021-05-05 DIAGNOSIS — R079 Chest pain, unspecified: Secondary | ICD-10-CM | POA: Diagnosis not present

## 2021-05-05 DIAGNOSIS — F419 Anxiety disorder, unspecified: Secondary | ICD-10-CM | POA: Diagnosis not present

## 2021-05-05 DIAGNOSIS — S2241XA Multiple fractures of ribs, right side, initial encounter for closed fracture: Secondary | ICD-10-CM | POA: Diagnosis not present

## 2021-05-05 DIAGNOSIS — L89812 Pressure ulcer of head, stage 2: Secondary | ICD-10-CM

## 2021-05-05 DIAGNOSIS — J811 Chronic pulmonary edema: Secondary | ICD-10-CM | POA: Diagnosis not present

## 2021-05-05 DIAGNOSIS — R0609 Other forms of dyspnea: Secondary | ICD-10-CM | POA: Diagnosis not present

## 2021-05-05 DIAGNOSIS — J309 Allergic rhinitis, unspecified: Secondary | ICD-10-CM | POA: Diagnosis present

## 2021-05-05 DIAGNOSIS — J984 Other disorders of lung: Secondary | ICD-10-CM | POA: Diagnosis not present

## 2021-05-05 DIAGNOSIS — J479 Bronchiectasis, uncomplicated: Secondary | ICD-10-CM | POA: Diagnosis not present

## 2021-05-05 DIAGNOSIS — I5043 Acute on chronic combined systolic (congestive) and diastolic (congestive) heart failure: Secondary | ICD-10-CM | POA: Diagnosis not present

## 2021-05-05 HISTORY — PX: LEFT HEART CATH AND CORONARY ANGIOGRAPHY: CATH118249

## 2021-05-05 LAB — TROPONIN I (HIGH SENSITIVITY)
Troponin I (High Sensitivity): 3 ng/L (ref <18)
Troponin I (High Sensitivity): 4 ng/L (ref <18)

## 2021-05-05 LAB — MRSA NEXT GEN BY PCR, NASAL: MRSA by PCR Next Gen: NOT DETECTED

## 2021-05-05 LAB — CBC
HCT: 42.6 % (ref 39.0–52.0)
Hemoglobin: 14 g/dL (ref 13.0–17.0)
MCH: 30.4 pg (ref 26.0–34.0)
MCHC: 32.9 g/dL (ref 30.0–36.0)
MCV: 92.6 fL (ref 80.0–100.0)
Platelets: 181 10*3/uL (ref 150–400)
RBC: 4.6 MIL/uL (ref 4.22–5.81)
RDW: 13.3 % (ref 11.5–15.5)
WBC: 9.5 10*3/uL (ref 4.0–10.5)
nRBC: 0 % (ref 0.0–0.2)

## 2021-05-05 LAB — BASIC METABOLIC PANEL
Anion gap: 9 (ref 5–15)
BUN: 18 mg/dL (ref 8–23)
CO2: 28 mmol/L (ref 22–32)
Calcium: 9.4 mg/dL (ref 8.9–10.3)
Chloride: 103 mmol/L (ref 98–111)
Creatinine, Ser: 1.19 mg/dL (ref 0.61–1.24)
GFR, Estimated: >60 mL/min (ref >60)
Glucose, Bld: 97 mg/dL (ref 70–99)
Potassium: 4.2 mmol/L (ref 3.5–5.1)
Sodium: 140 mmol/L (ref 135–145)

## 2021-05-05 LAB — ABO/RH: ABO/RH(D): O POS

## 2021-05-05 LAB — APTT: aPTT: 29 seconds (ref 24–36)

## 2021-05-05 LAB — PREPARE RBC (CROSSMATCH)

## 2021-05-05 LAB — RESP PANEL BY RT-PCR (FLU A&B, COVID) ARPGX2
Influenza A by PCR: NEGATIVE
Influenza B by PCR: NEGATIVE
SARS Coronavirus 2 by RT PCR: NEGATIVE

## 2021-05-05 LAB — PROTIME-INR
INR: 1.1 (ref 0.8–1.2)
Prothrombin Time: 14 seconds (ref 11.4–15.2)

## 2021-05-05 SURGERY — LEFT HEART CATH AND CORONARY ANGIOGRAPHY
Anesthesia: LOCAL

## 2021-05-05 MED ORDER — CHLORHEXIDINE GLUCONATE CLOTH 2 % EX PADS
6.0000 | MEDICATED_PAD | Freq: Once | CUTANEOUS | Status: AC
  Administered 2021-05-05: 6 via TOPICAL

## 2021-05-05 MED ORDER — FENTANYL CITRATE (PF) 100 MCG/2ML IJ SOLN
INTRAMUSCULAR | Status: DC | PRN
  Administered 2021-05-05: 25 ug via INTRAVENOUS

## 2021-05-05 MED ORDER — LABETALOL HCL 5 MG/ML IV SOLN: 10.0000 mg | INTRAVENOUS | Status: DC | PRN

## 2021-05-05 MED ORDER — SODIUM CHLORIDE 0.9 % IV SOLN: INTRAVENOUS | Status: DC

## 2021-05-05 MED ORDER — HYDRALAZINE HCL 20 MG/ML IJ SOLN: 10.0000 mg | INTRAMUSCULAR | Status: DC | PRN

## 2021-05-05 MED ORDER — MIDAZOLAM HCL 2 MG/2ML IJ SOLN
INTRAMUSCULAR | Status: AC
  Filled 2021-05-05: qty 2

## 2021-05-05 MED ORDER — PLASMA-LYTE A IV SOLN
INTRAVENOUS | Status: DC
  Filled 2021-05-05: qty 5

## 2021-05-05 MED ORDER — MORPHINE SULFATE (PF) 2 MG/ML IV SOLN
INTRAVENOUS | Status: AC
  Filled 2021-05-05: qty 1

## 2021-05-05 MED ORDER — SODIUM CHLORIDE 0.9% FLUSH
3.0000 mL | Freq: Two times a day (BID) | INTRAVENOUS | Status: DC
  Administered 2021-05-05: 3 mL via INTRAVENOUS

## 2021-05-05 MED ORDER — NITROGLYCERIN IN D5W 200-5 MCG/ML-% IV SOLN
INTRAVENOUS | Status: AC
  Filled 2021-05-05: qty 250

## 2021-05-05 MED ORDER — MIDAZOLAM HCL 2 MG/2ML IJ SOLN
INTRAMUSCULAR | Status: DC | PRN
  Administered 2021-05-05: .5 mg via INTRAVENOUS

## 2021-05-05 MED ORDER — LIDOCAINE HCL (PF) 1 % IJ SOLN
INTRAMUSCULAR | Status: AC
  Filled 2021-05-05: qty 30

## 2021-05-05 MED ORDER — SODIUM CHLORIDE 0.9 % IV SOLN: 250.0000 mL | INTRAVENOUS | Status: DC | PRN

## 2021-05-05 MED ORDER — CEFAZOLIN SODIUM-DEXTROSE 2-4 GM/100ML-% IV SOLN
2.0000 g | INTRAVENOUS | Status: DC
  Filled 2021-05-05: qty 100

## 2021-05-05 MED ORDER — ASPIRIN 81 MG PO CHEW
324.0000 mg | CHEWABLE_TABLET | Freq: Once | ORAL | Status: AC
  Administered 2021-05-05: 324 mg via ORAL
  Filled 2021-05-05: qty 4

## 2021-05-05 MED ORDER — HEPARIN SODIUM (PORCINE) 1000 UNIT/ML IJ SOLN
INTRAMUSCULAR | Status: DC | PRN
  Administered 2021-05-05: 3500 [IU] via INTRAVENOUS

## 2021-05-05 MED ORDER — ACETAMINOPHEN 325 MG PO TABS: 650.0000 mg | ORAL_TABLET | ORAL | Status: DC | PRN

## 2021-05-05 MED ORDER — DEXMEDETOMIDINE HCL IN NACL 400 MCG/100ML IV SOLN
0.1000 ug/kg/h | INTRAVENOUS | Status: AC
  Administered 2021-05-06 (×2): .5 ug/kg/h via INTRAVENOUS

## 2021-05-05 MED ORDER — TRANEXAMIC ACID (OHS) PUMP PRIME SOLUTION
2.0000 mg/kg | INTRAVENOUS | Status: DC
  Filled 2021-05-05: qty 1.57

## 2021-05-05 MED ORDER — HEPARIN SODIUM (PORCINE) 1000 UNIT/ML IJ SOLN
INTRAMUSCULAR | Status: AC
  Filled 2021-05-05: qty 10

## 2021-05-05 MED ORDER — EPINEPHRINE HCL 5 MG/250ML IV SOLN IN NS
0.0000 ug/min | INTRAVENOUS | Status: DC
  Filled 2021-05-05: qty 250

## 2021-05-05 MED ORDER — TRANEXAMIC ACID 1000 MG/10ML IV SOLN
1.5000 mg/kg/h | INTRAVENOUS | Status: AC
  Administered 2021-05-06: 1.5 mg/kg/h via INTRAVENOUS
  Filled 2021-05-05: qty 25

## 2021-05-05 MED ORDER — HEPARIN (PORCINE) 25000 UT/250ML-% IV SOLN
950.0000 [IU]/h | INTRAVENOUS | Status: DC
  Administered 2021-05-05: 950 [IU]/h via INTRAVENOUS
  Filled 2021-05-05 (×2): qty 250

## 2021-05-05 MED ORDER — INSULIN REGULAR(HUMAN) IN NACL 100-0.9 UT/100ML-% IV SOLN
INTRAVENOUS | Status: AC
  Administered 2021-05-06: 1.6 [IU]/h via INTRAVENOUS

## 2021-05-05 MED ORDER — MAGNESIUM SULFATE 50 % IJ SOLN
40.0000 meq | INTRAMUSCULAR | Status: DC
  Filled 2021-05-05: qty 9.85

## 2021-05-05 MED ORDER — TRANEXAMIC ACID (OHS) PUMP PRIME SOLUTION
2.0000 mg/kg | INTRAVENOUS | Status: DC
  Filled 2021-05-05: qty 1.55

## 2021-05-05 MED ORDER — ATORVASTATIN CALCIUM 80 MG PO TABS
80.0000 mg | ORAL_TABLET | Freq: Every day | ORAL | Status: DC
  Administered 2021-05-05 – 2021-05-09 (×4): 80 mg via ORAL
  Filled 2021-05-05 (×4): qty 1

## 2021-05-05 MED ORDER — TRANEXAMIC ACID (OHS) BOLUS VIA INFUSION
15.0000 mg/kg | INTRAVENOUS | Status: DC
  Filled 2021-05-05: qty 1178

## 2021-05-05 MED ORDER — FENTANYL CITRATE (PF) 100 MCG/2ML IJ SOLN
INTRAMUSCULAR | Status: AC
  Filled 2021-05-05: qty 2

## 2021-05-05 MED ORDER — MANNITOL 20 % IV SOLN
INTRAVENOUS | Status: DC
  Filled 2021-05-05: qty 13

## 2021-05-05 MED ORDER — ONDANSETRON HCL 4 MG/2ML IJ SOLN: 4.0000 mg | Freq: Four times a day (QID) | INTRAMUSCULAR | Status: DC | PRN

## 2021-05-05 MED ORDER — VANCOMYCIN HCL 1250 MG/250ML IV SOLN
1250.0000 mg | INTRAVENOUS | Status: AC
  Administered 2021-05-06: 1250 mg via INTRAVENOUS
  Filled 2021-05-05: qty 250

## 2021-05-05 MED ORDER — NITROGLYCERIN IN D5W 200-5 MCG/ML-% IV SOLN: 2.0000 ug/min | INTRAVENOUS | Status: DC

## 2021-05-05 MED ORDER — DEXMEDETOMIDINE HCL IN NACL 400 MCG/100ML IV SOLN
0.1000 ug/kg/h | INTRAVENOUS | Status: DC
  Filled 2021-05-05: qty 100

## 2021-05-05 MED ORDER — NITROGLYCERIN IN D5W 200-5 MCG/ML-% IV SOLN
2.0000 ug/min | INTRAVENOUS | Status: DC
  Filled 2021-05-05: qty 250

## 2021-05-05 MED ORDER — TRANEXAMIC ACID (OHS) BOLUS VIA INFUSION
15.0000 mg/kg | INTRAVENOUS | Status: AC
  Administered 2021-05-06: 1164 mg via INTRAVENOUS
  Filled 2021-05-05: qty 1164

## 2021-05-05 MED ORDER — HEPARIN (PORCINE) IN NACL 1000-0.9 UT/500ML-% IV SOLN
INTRAVENOUS | Status: AC
  Filled 2021-05-05: qty 1000

## 2021-05-05 MED ORDER — VERAPAMIL HCL 2.5 MG/ML IV SOLN
INTRAVENOUS | Status: DC | PRN
  Administered 2021-05-05: 10 mL via INTRA_ARTERIAL

## 2021-05-05 MED ORDER — POTASSIUM CHLORIDE 2 MEQ/ML IV SOLN
80.0000 meq | INTRAVENOUS | Status: DC
  Filled 2021-05-05: qty 40

## 2021-05-05 MED ORDER — NITROGLYCERIN 0.4 MG SL SUBL
0.4000 mg | SUBLINGUAL_TABLET | SUBLINGUAL | Status: DC | PRN
  Administered 2021-05-05: 0.4 mg via SUBLINGUAL
  Filled 2021-05-05 (×2): qty 1

## 2021-05-05 MED ORDER — SODIUM CHLORIDE 0.9 % IV SOLN: INTRAVENOUS | Status: AC

## 2021-05-05 MED ORDER — TRANEXAMIC ACID 1000 MG/10ML IV SOLN
1.5000 mg/kg/h | INTRAVENOUS | Status: DC
  Filled 2021-05-05: qty 25

## 2021-05-05 MED ORDER — NITROGLYCERIN IN D5W 200-5 MCG/ML-% IV SOLN
10.0000 ug/min | INTRAVENOUS | Status: DC
  Administered 2021-05-05: 10 ug/min via INTRAVENOUS

## 2021-05-05 MED ORDER — MILRINONE LACTATE IN DEXTROSE 20-5 MG/100ML-% IV SOLN
0.3000 ug/kg/min | INTRAVENOUS | Status: AC
  Administered 2021-05-06: .25 ug/kg/min via INTRAVENOUS

## 2021-05-05 MED ORDER — MORPHINE SULFATE (PF) 2 MG/ML IV SOLN
2.0000 mg | INTRAVENOUS | Status: DC | PRN
  Administered 2021-05-05 – 2021-05-06 (×5): 2 mg via INTRAVENOUS
  Filled 2021-05-05 (×5): qty 1

## 2021-05-05 MED ORDER — HEPARIN (PORCINE) IN NACL 1000-0.9 UT/500ML-% IV SOLN
INTRAVENOUS | Status: DC | PRN
  Administered 2021-05-05 (×2): 500 mL

## 2021-05-05 MED ORDER — NOREPINEPHRINE 4 MG/250ML-% IV SOLN
0.0000 ug/min | INTRAVENOUS | Status: DC
  Filled 2021-05-05: qty 250

## 2021-05-05 MED ORDER — NOREPINEPHRINE 4 MG/250ML-% IV SOLN
0.0000 ug/min | INTRAVENOUS | Status: AC
  Administered 2021-05-06: 2 ug/min via INTRAVENOUS

## 2021-05-05 MED ORDER — HEPARIN SODIUM (PORCINE) 5000 UNIT/ML IJ SOLN
4000.0000 [IU] | Freq: Once | INTRAMUSCULAR | Status: AC
  Administered 2021-05-05: 4000 [IU] via INTRAVENOUS
  Filled 2021-05-05: qty 1

## 2021-05-05 MED ORDER — CEFAZOLIN SODIUM-DEXTROSE 2-4 GM/100ML-% IV SOLN
2.0000 g | INTRAVENOUS | Status: AC
  Administered 2021-05-06: 2 g via INTRAVENOUS

## 2021-05-05 MED ORDER — SODIUM CHLORIDE 0.9% FLUSH: 3.0000 mL | INTRAVENOUS | Status: DC | PRN

## 2021-05-05 MED ORDER — HEPARIN 30,000 UNITS/1000 ML (OHS) CELLSAVER SOLUTION
Status: DC
  Filled 2021-05-05: qty 1000

## 2021-05-05 MED ORDER — ASPIRIN 81 MG PO CHEW: 81.0000 mg | CHEWABLE_TABLET | Freq: Every day | ORAL | Status: DC

## 2021-05-05 MED ORDER — EPINEPHRINE HCL 5 MG/250ML IV SOLN IN NS
0.0000 ug/min | INTRAVENOUS | Status: AC
  Administered 2021-05-06: 1 ug/min via INTRAVENOUS

## 2021-05-05 MED ORDER — LIDOCAINE HCL (PF) 1 % IJ SOLN
INTRAMUSCULAR | Status: DC | PRN
  Administered 2021-05-05: 2 mL

## 2021-05-05 MED ORDER — CHLORHEXIDINE GLUCONATE 0.12 % MT SOLN
15.0000 mL | Freq: Once | OROMUCOSAL | Status: AC
  Administered 2021-05-06: 15 mL via OROMUCOSAL
  Filled 2021-05-05: qty 15

## 2021-05-05 MED ORDER — OXYCODONE HCL 5 MG PO TABS: 5.0000 mg | ORAL_TABLET | ORAL | Status: DC | PRN

## 2021-05-05 MED ORDER — VANCOMYCIN HCL 1250 MG/250ML IV SOLN
1250.0000 mg | Freq: Once | INTRAVENOUS | Status: DC
  Filled 2021-05-05: qty 250

## 2021-05-05 MED ORDER — VERAPAMIL HCL 2.5 MG/ML IV SOLN
INTRAVENOUS | Status: AC
  Filled 2021-05-05: qty 2

## 2021-05-05 MED ORDER — ALPRAZOLAM 0.25 MG PO TABS: 0.2500 mg | ORAL_TABLET | ORAL | Status: DC | PRN

## 2021-05-05 MED ORDER — PHENYLEPHRINE HCL-NACL 20-0.9 MG/250ML-% IV SOLN
30.0000 ug/min | INTRAVENOUS | Status: AC
  Administered 2021-05-06: 50 ug/min via INTRAVENOUS

## 2021-05-05 MED ORDER — METOPROLOL TARTRATE 12.5 MG HALF TABLET
12.5000 mg | ORAL_TABLET | Freq: Once | ORAL | Status: DC
  Filled 2021-05-05: qty 1

## 2021-05-05 MED ORDER — NITROGLYCERIN 2 % TD OINT
1.0000 [in_us] | TOPICAL_OINTMENT | Freq: Once | TRANSDERMAL | Status: DC
  Filled 2021-05-05: qty 30

## 2021-05-05 MED ORDER — PHENYLEPHRINE HCL-NACL 20-0.9 MG/250ML-% IV SOLN
30.0000 ug/min | INTRAVENOUS | Status: DC
  Filled 2021-05-05: qty 250

## 2021-05-05 MED ORDER — INSULIN REGULAR(HUMAN) IN NACL 100-0.9 UT/100ML-% IV SOLN
INTRAVENOUS | Status: DC
  Filled 2021-05-05: qty 100

## 2021-05-05 MED ORDER — TEMAZEPAM 7.5 MG PO CAPS
15.0000 mg | ORAL_CAPSULE | Freq: Once | ORAL | Status: AC | PRN
  Administered 2021-05-05: 15 mg via ORAL
  Filled 2021-05-05: qty 2

## 2021-05-05 MED ORDER — MILRINONE LACTATE IN DEXTROSE 20-5 MG/100ML-% IV SOLN
0.3000 ug/kg/min | INTRAVENOUS | Status: DC
  Filled 2021-05-05: qty 100

## 2021-05-05 MED ORDER — BISACODYL 5 MG PO TBEC
5.0000 mg | DELAYED_RELEASE_TABLET | Freq: Once | ORAL | Status: AC
  Administered 2021-05-05: 5 mg via ORAL
  Filled 2021-05-05: qty 1

## 2021-05-05 SURGICAL SUPPLY — 11 items
CATH INFINITI 5 FR JL3.5 (CATHETERS) ×1 IMPLANT
CATH INFINITI JR4 5F (CATHETERS) ×1 IMPLANT
DEVICE RAD COMP TR BAND LRG (VASCULAR PRODUCTS) ×1 IMPLANT
GLIDESHEATH SLEND A-KIT 6F 22G (SHEATH) ×1 IMPLANT
GUIDEWIRE INQWIRE 1.5J.035X260 (WIRE) IMPLANT
INQWIRE 1.5J .035X260CM (WIRE) ×2
KIT ENCORE 26 ADVANTAGE (KITS) ×1 IMPLANT
KIT HEART LEFT (KITS) ×2 IMPLANT
PACK CARDIAC CATHETERIZATION (CUSTOM PROCEDURE TRAY) ×2 IMPLANT
TRANSDUCER W/STOPCOCK (MISCELLANEOUS) ×2 IMPLANT
TUBING CIL FLEX 10 FLL-RA (TUBING) ×2 IMPLANT

## 2021-05-05 NOTE — Anesthesia Preprocedure Evaluation (Addendum)
Anesthesia Evaluation  Patient identified by MRN, date of birth, ID band Patient awake    Reviewed: Allergy & Precautions, NPO status , Patient's Chart, lab work & pertinent test results  History of Anesthesia Complications Negative for: history of anesthetic complications  Airway Mallampati: I  TM Distance: >3 FB Neck ROM: Full    Dental  (+) Dental Advisory Given   Pulmonary COPD,  COPD inhaler,    breath sounds clear to auscultation       Cardiovascular hypertension, Pt. on medications + angina (LM disease) at rest  Rhythm:Regular Rate:Normal  '22 ECHO: EF 60-65%, normal LVF, normal RVF, mild MR   Neuro/Psych negative neurological ROS     GI/Hepatic Neg liver ROS, GERD  Medicated and Controlled,  Endo/Other  negative endocrine ROS  Renal/GU negative Renal ROS   H/o prostate cancer    Musculoskeletal   Abdominal   Peds  Hematology Hb 14, plt 181k   Anesthesia Other Findings   Reproductive/Obstetrics                            Anesthesia Physical Anesthesia Plan  ASA: 4  Anesthesia Plan: General   Post-op Pain Management:    Induction: Intravenous  PONV Risk Score and Plan: 2 and Treatment may vary due to age or medical condition  Airway Management Planned: Oral ETT  Additional Equipment: Arterial line, PA Cath, TEE and Ultrasound Guidance Line Placement  Intra-op Plan:   Post-operative Plan: Post-operative intubation/ventilation  Informed Consent: I have reviewed the patients History and Physical, chart, labs and discussed the procedure including the risks, benefits and alternatives for the proposed anesthesia with the patient or authorized representative who has indicated his/her understanding and acceptance.     Dental advisory given  Plan Discussed with: CRNA and Surgeon  Anesthesia Plan Comments:        Anesthesia Quick Evaluation

## 2021-05-05 NOTE — Telephone Encounter (Signed)
Pt c/o of Chest Pain: STAT if CP now or developed within 24 hours  1. Are you having CP right now? Yes  2. Are you experiencing any other symptoms (ex. SOB, nausea, vomiting, sweating)? Bradycardia and hard to take a deep breathe due to chest soreness   3. How long have you been experiencing CP? Started last night   4. Is your CP continuous or coming and going? Continuous   5. Have you taken Nitroglycerin? No    Pt c/o medication issue:  1. Name of Medication:  metoprolol tartrate (LOPRESSOR) 100 MG tablet  2. How are you currently taking this medication (dosage and times per day)? As prescribed   3. Are you having a reaction (difficulty breathing--STAT)? Yes   4. What is your medication issue?  Reports he is having CP that started last night and HR dropped to 55 after taking the medication today. BP running 115/70. Transferred to RN in triage.  ?

## 2021-05-05 NOTE — Progress Notes (Signed)
Pre-CABG Dopplers completed. Refer to "CV Proc" under chart review to view preliminary results.  04/29/2021 4:01 PM Kelby Aline., MHA, RVT, RDCS, RDMS   [facility], RVT, RDMS

## 2021-05-05 NOTE — ED Provider Notes (Signed)
[facility] EMERGENCY DEPT Provider Note   CSN: [id number] Arrival date & time: 05/04/2021  1311     History  Chief Complaint  Patient presents with   Chest Pain    Randall Hodges is a 71 y.o. male.  Patient is a 71 year old male who presents with chest pain.  He has a history of hypertension, hyperlipidemia.  He started having some tightness in his chest last night.  Its in his left chest.  Its not really radiating.  It got worse through the morning.  He still having some symptoms.  Nothing really changes symptoms.  He feels a little short of breath and they feel like he cannot take a deep breath.  He denies any leg pain or swelling.  No known prior history of coronary artery disease.  He is followed by Dr. Percival Spanish      Home Medications Prior to Admission medications   Medication Sig Start Date End Date Taking? Authorizing Provider  albuterol (VENTOLIN HFA) 108 (90 Base) MCG/ACT inhaler SMARTSIG:1-2 Puff(s) Via Inhaler Every 4 Hours PRN 04/07/21   [provider]  aspirin EC 81 MG tablet Take 1 tablet (81 mg total) by mouth daily. Swallow whole. 07/18/20   [name], MD  cetirizine (ZYRTEC) 10 MG tablet Take 10 mg by mouth daily.    [provider]  metoprolol tartrate (LOPRESSOR) 100 MG tablet Take 1 tablet (100 mg total) by mouth 2 (two) times daily. 04/30/21 07/29/21  Minus Breeding, MD  omeprazole (PRILOSEC) 40 MG capsule Take 40 mg by mouth as needed. 03/09/18   [provider]  rosuvastatin (CRESTOR) 10 MG tablet Take 1 tablet (10 mg total) by mouth daily. 12/11/20 04/30/21  Leonie Man, MD      Allergies    Amoxicillin-pot clavulanate, Atorvastatin, Plant derived enzymes, and Trichophyton    Review of Systems   Review of Systems  Constitutional:  Negative for chills, diaphoresis, fatigue and fever.  HENT:  Negative for congestion, rhinorrhea and sneezing.   Eyes: Negative.   Respiratory:  Positive for chest tightness  and shortness of breath. Negative for cough.   Cardiovascular:  Negative for chest pain and leg swelling.  Gastrointestinal:  Negative for abdominal pain, blood in stool, diarrhea, nausea and vomiting.  Genitourinary:  Negative for difficulty urinating, flank pain, frequency and hematuria.  Musculoskeletal:  Negative for arthralgias and back pain.  Skin:  Negative for rash.  Neurological:  Negative for dizziness, speech difficulty, weakness, numbness and headaches.   Physical Exam Updated Vital Signs BP (!) 157/77 (BP Location: Left Arm)    Pulse 63    Temp (!) 97.4 F (36.3 C)    Resp 14    Ht 5\' 10"  (1.778 m)    Wt 78.5 kg    SpO2 99%    BMI 24.82 kg/m  Physical Exam Constitutional:      Appearance: He is well-developed.  HENT:     Head: Normocephalic and atraumatic.  Eyes:     Pupils: Pupils are equal, round, and reactive to light.  Cardiovascular:     Rate and Rhythm: Normal rate and regular rhythm.     Heart sounds: Normal heart sounds.  Pulmonary:     Effort: Pulmonary effort is normal. No respiratory distress.     Breath sounds: Normal breath sounds. No wheezing or rales.  Chest:     Chest wall: No tenderness.  Abdominal:     General: Bowel sounds are normal.  Palpations: Abdomen is soft.     Tenderness: There is no abdominal tenderness. There is no guarding or rebound.  Musculoskeletal:        General: Normal range of motion.     Cervical back: Normal range of motion and neck supple.  Lymphadenopathy:     Cervical: No cervical adenopathy.  Skin:    General: Skin is warm and dry.     Findings: No rash.  Neurological:     Mental Status: He is alert and oriented to person, place, and time.    ED Results / Procedures / Treatments   Labs (all labs ordered are listed, but only abnormal results are displayed) Labs Reviewed  RESP PANEL BY RT-PCR (FLU A&B, COVID) ARPGX2  BASIC METABOLIC PANEL  CBC  PROTIME-INR  APTT  TROPONIN I (HIGH SENSITIVITY)     EKG None  Radiology No results found.  Procedures Procedures    Medications Ordered in ED Medications  0.9 %  sodium chloride infusion (has no administration in time range)  nitroGLYCERIN (NITROSTAT) SL tablet 0.4 mg (0.4 mg Sublingual Given 04/29/2021 1351)  aspirin chewable tablet 324 mg (324 mg Oral Given 05/14/2021 1351)  heparin injection 4,000 Units (4,000 Units Intravenous Given 04/24/2021 1351)    ED Course/ Medical Decision Making/ A&P                           Medical Decision Making Amount and/or Complexity of Data Reviewed External Data Reviewed: ECG and notes. Labs: ordered. Decision-making details documented in ED Course. Radiology: ordered and independent interpretation performed. Decision-making details documented in ED Course. ECG/medicine tests: ordered and independent interpretation performed. Decision-making details documented in ED Course.  Risk Drug therapy requiring intensive monitoring for toxicity. Decision regarding hospitalization.  Critical Care Total time providing critical care: < 30 minutes  Patient is a 71 year old male with a history of hypertension and hyperlipidemia who presents with chest pain.  His EKG on arrival look concerning.  This was repeated.  He is having some ectopy but on the normal beats he still seems to have some ST elevation in 1 and aVL with some ST depression and T wave inversion in lead III.  This appears to be different from her recent EKG that he had on January 13 at Dr. Rosezella [place] office.  Given this, STEMI was activated.  I spoke with the cardiology team.  We will transfer the patient to [facility] and they will see the patient in the ED and decide whether to go directly to the Cath Lab.  His pain has improved after nitroglycerin.  He was given aspirin and a heparin bolus.  Will be transported by [employer].  Chest x-ray reviewed and shows some scarring in his lung but no acute abnormalities.  This is interpreted by me.  His  troponin is negative.   {Final Clinical Impression(s) / ED Diagnoses Final diagnoses:  ST elevation myocardial infarction (STEMI), unspecified artery [facility])  Atrial ectopy    Rx / DC Orders ED Discharge Orders     None         Malvin Johns, MD 05/12/2021 1402

## 2021-05-05 NOTE — Progress Notes (Addendum)
Ongoing mild chest pain while discussing CABG with Dr. PVT. ECG stat unchanged from admission. II and F ST decression Continue to Cycle markers, morphine for pain, CABG in AM unless clinical status changes. Discussed with Dr. PVT. No balloon pump at this time.

## 2021-05-05 NOTE — ED Notes (Signed)
Care Handoff/Report given to Santiago Glad, Therapist, sports at [facility]. All Questions answered.

## 2021-05-05 NOTE — CV Procedure (Signed)
Distal left main [place] 111 bifurcation stenosis with 75% left main, 90% ostial to proximal LAD, and 80-90% ostial circumflex (difficult to assess due to heavy calcification). Severe mid circumflex disease with 70% eccentric mid stenosis and second obtuse marginal containing ostial to proximal greater than 80% stenosis.  (Bifurcation [place] 111 Severe calcification in left main and LAD in particular with diffuse 50% narrowing from proximal to mid vessel and tandem 70% stenoses in the mid LAD. Nondominant right coronary Normal LV function.  EF 55%.  LVEDP normal.

## 2021-05-05 NOTE — Progress Notes (Addendum)
Dr. Nils Pyle in. 12 lead EKG done

## 2021-05-05 NOTE — ED Notes (Signed)
Care Handoff/Report given to [facility] at this Time at the Bedside. All Questions answered.

## 2021-05-05 NOTE — Progress Notes (Signed)
ANTICOAGULATION CONSULT NOTE - Initial Consult  Pharmacy Consult for IV Heparin Indication: chest pain/ACS  Allergies  Allergen Reactions   Amoxicillin-Pot Clavulanate     Per pt report on 08/19/20, makes his urine dark colored   Atorvastatin Other (See Comments)     ( pt states causing runny nose, headaches, issue w/ urination)   Plant Derived Enzymes     Other reaction(s): Unknown   Trichophyton Other (See Comments)    Patient Measurements: Height: 5\' 10"  (177.8 cm) Weight: 78.5 kg (173 lb) IBW/kg (Calculated) : 73 Heparin Dosing Weight: 78.5 kg  Vital Signs: Temp: 97.4 F (36.3 C) (01/17 1316) BP: 170/88 (01/17 1600) Pulse Rate: 65 (01/17 1600)  Labs: Recent Labs    04/19/2021 1321  HGB 14.0  HCT 42.6  PLT 181  APTT 29  LABPROT 14.0  INR 1.1  CREATININE 1.19  TROPONINIHS 3    Estimated Creatinine Clearance: 58.8 mL/min (by C-G formula based on SCr of 1.19 mg/dL).  Medical History: Past Medical History:  Diagnosis Date   Allergic rhinitis    Prostate cancer [facility])     Assessment: 71 yr old man with hx of PVCs admitted today for evaluation of CP, ACS; HLD and family hx of CAD. Pt is S/P cardiac cath today, which showed severe CAD; cardiac surgery is consulted for CABG. Pharmacy is consulted to dose IV heparin to start 8 hrs after sheath removal (pt was not on any anticoagulant PTA).  H/H 14.0/42.6, plt 181; INR 1.1; Scr 1.19  Per cardiac cath procedure log, sheath was removed at 1507 PM today.  Goal of Therapy:  Heparin level 0.3-0.7 units/ml Monitor platelets by anticoagulation protocol: Yes   Plan:  Start heparin infusion (no bolus) at 950 units/hr at 2300 PM this evening (~8 hrs after sheath removal) Check heparin level 6 hrs after starting heparin infusion Monitor daily heparin level, CBC Monitor for bleeding  Gillermina Hu, PharmD, BCPS, [facility] Clinical Pharmacist 05/15/2021,4:04 PM

## 2021-05-05 NOTE — H&P (Signed)
Cardiology Admission History and Physical:   Patient ID: Randall Hodges MRN: [id number]; DOB: 02-20-1951   Admission date: (Not on file)  PCP:  [name], MD   [facility] Providers Cardiologist:  [name], MD  Electrophysiologist:  Constance Haw, MD       Chief Complaint: Prolonged chest pain with phasic EKG changes  Patient Profile:   Randall Hodges is a 71 y.o. male with history of PVCs who is being seen 04/22/2021 for the evaluation of prolonged chest pain over the past 24 hours off and on..  History of Present Illness:   Mr. Stillings he has a pre-existing history of premature ventricular contractions for which she has seen [name].  He saw Dr. Percival Spanish on 04/30/2021 because of chest discomfort over the past month.  He describes the discomfort as a soreness that is there most of the time.  He states that taking a deep breath accentuates the discomfort.  It became more severe at around 3 AM and was more intense leading to the decision to be seen in the emergency room.  The plan after seeing Dr. Percival Spanish was to perform coronary CTA.  Despite 3 hours of continuous discomfort, initial troponin I is 3.  2 brothers have coronary disease.  Father had coronary disease.  At least one of his brothers had a stent.  He has history of hyperlipidemia.  Stress myocardial perfusion study in May 2022 demonstrated EF of 41% with low risk perfusion pattern.  Echo demonstrated EF 65% May 2022.  Noncardiac CT performed in February 2019 revealed three-vessel coronary artery calcification.  Past Medical History:  Diagnosis Date   Allergic rhinitis    Prostate cancer [facility])     Past Surgical History:  Procedure Laterality Date   PROSTATE BIOPSY     SHOULDER SURGERY       Medications Prior to Admission: Prior to Admission medications   Medication Sig Start Date End Date Taking? Authorizing Provider  albuterol (VENTOLIN HFA) 108 (90 Base) MCG/ACT inhaler SMARTSIG:1-2  Puff(s) Via Inhaler Every 4 Hours PRN 04/07/21   [provider]  aspirin EC 81 MG tablet Take 1 tablet (81 mg total) by mouth daily. Swallow whole. 07/18/20   [name], MD  cetirizine (ZYRTEC) 10 MG tablet Take 10 mg by mouth daily.    [provider]  metoprolol tartrate (LOPRESSOR) 100 MG tablet Take 1 tablet (100 mg total) by mouth 2 (two) times daily. 04/30/21 07/29/21  Minus Breeding, MD  omeprazole (PRILOSEC) 40 MG capsule Take 40 mg by mouth as needed. 03/09/18   [provider]  rosuvastatin (CRESTOR) 10 MG tablet Take 1 tablet (10 mg total) by mouth daily. 12/11/20 04/30/21  Leonie Man, MD     Allergies:    Allergies  Allergen Reactions   Amoxicillin-Pot Clavulanate     Per pt report on 08/19/20, makes his urine dark colored   Atorvastatin Other (See Comments)     ( pt states causing runny nose, headaches, issue w/ urination)   Plant Derived Enzymes     Other reaction(s): Unknown   Trichophyton Other (See Comments)    Social History:   Social History   Socioeconomic History   Marital status: Married    Spouse name: Not on file   Number of children: 2   Years of education: Not on file   Highest education level: Not on file  Occupational History    Comment: full time   Tobacco Use  Smoking status: Never   Smokeless tobacco: Never  Vaping Use   Vaping Use: Never used  Substance and Sexual Activity   Alcohol use: Yes    Comment: occ   Drug use: Never   Sexual activity: Not on file  Other Topics Concern   Not on file  Social History Narrative   Not on file   Social Determinants of Health   Financial Resource Strain: Not on file  Food Insecurity: Not on file  Transportation Needs: Not on file  Physical Activity: Not on file  Stress: Not on file  Social Connections: Not on file  Intimate Partner Violence: Not on file    Family History: Father and 2 brothers have heart disease.  1 brother had coronary bypass surgery. The  patient's family history includes Diabetes in his father; Hypertension in his paternal grandmother; Prostate cancer (age of onset: 63) in his father; Prostate cancer (age of onset: 59) in his brother. There is no history of Breast cancer, Colon cancer, or Pancreatic cancer.    ROS:  Please see the history of present illness.  No history of lung disease.  Does not smoke.  Denies dyspnea.  All other ROS reviewed and negative.     Physical Exam/Data:  There were no vitals filed for this visit. No intake or output data in the 24 hours ending 05/10/2021 1520 Last 3 Weights 04/26/2021 04/30/2021 03/24/2021  Weight (lbs) 173 lb 175 lb 6.4 oz 175 lb  Weight (kg) 78.472 kg 79.561 kg 79.379 kg     There is no height or weight on file to calculate BMI.  General:  Well nourished, well developed, in no acute distress HEENT: normal Neck: no JVD Vascular: No carotid bruits; Distal pulses 2+ bilaterally   Cardiac:  normal S1, S2; RRR; no murmur. Lungs:  clear to auscultation bilaterally, no wheezing, rhonchi or rales  Abd: soft, nontender, no hepatomegaly  Ext: no edema Musculoskeletal:  No deformities, BUE and BLE strength normal and equal Skin: warm and dry  Neuro:  CNs 2-12 intact, no focal abnormalities noted Psych:  Normal affect    EKG:  The ECG that was done at 1:28 PM demonstrates ischemic inferior ST depression with mild ST elevation in lead I and aVL.  PVCs are noted.  Personally reviewed the EKG.  In comparison to the prior EKG the ST segment abnormality is new.  Relevant CV Studies:  2D Doppler echocardiogram 08/21/2020: IMPRESSIONS     1. Left ventricular ejection fraction, by estimation, is 60 to 65%. The  left ventricle has normal function. The left ventricle has no regional  wall motion abnormalities. Left ventricular diastolic parameters were  normal.   2. Right ventricular systolic function is normal. The right ventricular  size is normal. Tricuspid regurgitation signal is  inadequate for assessing  PA pressure.   3. The mitral valve is normal in structure. Trivial mitral valve  regurgitation. No evidence of mitral stenosis.   4. The aortic valve is tricuspid. There is mild thickening of the aortic  valve. Aortic valve regurgitation is not visualized. No aortic stenosis is  present.   5. The inferior vena cava is normal in size with greater than 50%  respiratory variability, suggesting right atrial pressure of 3 mmHg.   Conclusion(s)/Recommendation(s): Frequent PVCs are seen during the study.   Laboratory Data:  High Sensitivity Troponin:   Recent Labs  Lab 05/16/2021 1321  TROPONINIHS 3      Chemistry Recent Labs  Lab 04/25/2021 1321  NA 140  K 4.2  CL 103  CO2 28  GLUCOSE 97  BUN 18  CREATININE 1.19  CALCIUM 9.4  GFRNONAA >60  ANIONGAP 9    No results for input(s): PROT, ALBUMIN, AST, ALT, ALKPHOS, BILITOT in the last 168 hours. Lipids No results for input(s): CHOL, TRIG, HDL, LABVLDL, LDLCALC, CHOLHDL in the last 168 hours. Hematology Recent Labs  Lab 05/04/2021 1321  WBC 9.5  RBC 4.60  HGB 14.0  HCT 42.6  MCV 92.6  MCH 30.4  MCHC 32.9  RDW 13.3  PLT 181   Thyroid No results for input(s): TSH, FREET4 in the last 168 hours. BNPNo results for input(s): BNP, PROBNP in the last 168 hours.  DDimer No results for input(s): DDIMER in the last 168 hours.   Radiology/Studies:  DG Chest Port 1 View  Result Date: 05/11/2021 CLINICAL DATA:  Chest pain, STEMI EXAM: PORTABLE CHEST 1 VIEW COMPARISON:  Portable exam 1348 hours compared to CT chest of 06/03/2017 FINDINGS: Normal heart size mediastinal contours. BILATERAL pleuroparenchymal scarring. Peripheral opacities in both lungs, similar to prior CT exam, likely peripheral chronic interstitial lung disease changes. No new infiltrate, pleural effusion, or pneumothorax. Bones demineralized. IMPRESSION: Peripheral chronic interstitial lung disease changes with biapical scarring, similar to prior  CT. No acute abnormalities Electronically Signed   By: Lavonia Dana M.D.   On: 04/25/2021 13:57     Assessment and Plan:   Acute coronary syndrome: with ongoing chest pain which has ambiguous features, normal initial troponin despite greater than 3 hours of chest discomfort, and ischemic appearing EKG changes inferiorly and lateral 1 EKG. Hyperlipidemia: Family history CAD  Recommendation to the patient after examination in the emergency room was to perform urgent coronary angiography, define anatomy, and allow anatomy to guide therapy.  The procedure including the risk of stroke, death, bleeding, kidney injury, was discussed with the patient in detail.   Risk Assessment/Risk Scores:    TIMI Risk Score for Unstable Angina or Non-ST Elevation MI:   The patient's TIMI risk score is 5, which indicates a 26% risk of all cause mortality, new or recurrent myocardial infarction or need for urgent revascularization in the next 14 days.       Severity of Illness: The appropriate patient status for this patient is INPATIENT. Inpatient status is judged to be reasonable and necessary in order to provide the required intensity of service to ensure the patient's safety. The patient's presenting symptoms, physical exam findings, and initial radiographic and laboratory data in the context of their chronic comorbidities is felt to place them at high risk for further clinical deterioration. Furthermore, it is not anticipated that the patient will be medically stable for discharge from the hospital within 2 midnights of admission.   * I certify that at the point of admission it is my clinical judgment that the patient will require inpatient hospital care spanning beyond 2 midnights from the point of admission due to high intensity of service, high risk for further deterioration and high frequency of surveillance required.*   For questions or updates, please contact [place] Please consult [email or web address]  for contact info under     Signed, Sinclair Grooms, MD  04/30/2021 3:20 PM

## 2021-05-05 NOTE — Progress Notes (Signed)
Patient spoke w/wife on phone. Pre CABG studies being done.

## 2021-05-05 NOTE — ED Triage Notes (Signed)
Chest pain that began overnight that has gotten increasingly worse since this AM. Central chest pain that does not radiate. Mild HA, denies SOB, vision changes, or diaphoresis. Has cardiologist.

## 2021-05-05 NOTE — Telephone Encounter (Signed)
Spoke to pt who c/o of constant chest pressure that started last night. Pt was recently seen by Dr. Warren Lacy on 1/12 who recommended a Cardiac CTA but pt report symptoms has worsened. Pt report he the pressure is pretty constant and feel like he can't take a deep breath.  Pt also report he was recently started on metoprolol and within 24 hour noticed his HR dropped to 50's  Based on pt's current chest discomfort, nurse recommended pt report to ER for further evaluations. Pt verbalized understanding.

## 2021-05-05 NOTE — ED Notes (Signed)
Care Handoff/Report given to [place], Cath Lab at this Time. All Questions answered.

## 2021-05-05 NOTE — Consult Note (Signed)
[address]       [place] [address]             [phone]        Randall Hodges  Medical Record #[id number] Date of Birth: October 17, 1950  Referring: No ref. provider found Primary Care: [name], MD Primary Cardiologist:Gayatri Stann Mainland, MD  Chief Complaint:    Chief Complaint  Patient presents with   Chest Pain    History of Present Illness:      Patient examined, images of coronary arteriogram and echocardiogram personally reviewed and counseled with patient. [name] was hospitalized via the ER today for STEMI when he developed chest pain in the early a.m. while in bed which persisted.  In the ED his troponin was not elevated but he did had some EKG changes.  Cardiac catheterization by Dr. Daneen Schick demonstrates significant left main stenosis with some disease in a small nondominant RCA.  LV function is normal and LVEDP is normal.  The patient is feeling improved on IV nitroglycerin and heparin protocol was ordered.  He is in a sinus rhythm and is breathing comfortably.  Chest x-ray shows no evidence of edema.  Carotid duplex ultrasound shows no significant stenosis.  There is a positive family history of coronary artery disease.  Current Activity/ Functional Status: Normal ADLs at home with wife   Zubrod Score: At the time of surgery this patients most appropriate activity status/level should be described as: []     0    Normal activity, no symptoms []     1    Restricted in physical strenuous activity but ambulatory, able to do out light work []     2    Ambulatory and capable of self care, unable to do work activities, up and about                 more than 50%  Of the time                            [x]     3    Only limited self care, in bed greater than 50% of waking hours []     4    Completely disabled, no self care, confined to bed or chair []     5    Moribund  Past Medical History:   Diagnosis Date   Allergic rhinitis    Prostate cancer ([place])     Past Surgical History:  Procedure Laterality Date   PROSTATE BIOPSY     SHOULDER SURGERY      Social History   Tobacco Use  Smoking Status Never  Smokeless Tobacco Never    Social History   Substance and Sexual Activity  Alcohol Use Yes   Comment: occ     Allergies  Allergen Reactions   Amoxicillin-Pot Clavulanate     Per pt report on 08/19/20, makes his urine dark colored   Atorvastatin Other (See Comments)     ( pt states causing runny nose, headaches, issue w/ urination)   Plant Derived Enzymes     Other reaction(s): Unknown   Trichophyton Other (See Comments)    Current Facility-Administered Medications  Medication Dose Route Frequency Provider Last Rate Last Admin   0.9 %  sodium chloride infusion   Intravenous Continuous Belfi, Melanie, MD       0.9 %  sodium chloride infusion   Intravenous  Continuous Belva Crome, MD 125 mL/hr at 04/30/2021 1606 Rate Change at 05/16/2021 1606   [START ON 05/04/2021] ceFAZolin (ANCEF) IVPB 2g/100 mL premix  2 g Intravenous To OR Lajuana Matte, MD       [START ON 04/29/2021] ceFAZolin (ANCEF) IVPB 2g/100 mL premix  2 g Intravenous To OR Lajuana Matte, MD       [START ON 04/29/2021] dexmedetomidine (PRECEDEX) 400 MCG/100ML (4 mcg/mL) infusion  0.1-0.7 mcg/kg/hr Intravenous To OR Lajuana Matte, MD       [START ON 05/07/2021] EPINEPHrine (ADRENALIN) 5 mg in NS 250 mL (0.02 mg/mL) premix infusion  0-10 mcg/min Intravenous To OR Lightfoot, Lucile Crater, MD       fentaNYL (SUBLIMAZE) injection    PRN Belva Crome, MD   25 mcg at 04/24/2021 1443   Heparin (Porcine) in NaCl 1000-0.9 UT/500ML-% SOLN    PRN Belva Crome, MD   500 mL at 05/17/2021 1521   [START ON 05/02/2021] heparin 30,000 units/NS 1000 mL solution for CELLSAVER   Other To OR Lightfoot, Lucile Crater, MD       heparin ADULT infusion 100 units/mL (25000 units/240mL)  950 Units/hr Intravenous Continuous  Belva Crome, MD       [START ON 04/29/2021] heparin sodium (porcine) 5,000 Units, papaverine 60 mg in electrolyte-A (PLASMALYTE-A PH 7.4) 1,000 mL irrigation   Irrigation To OR Lightfoot, Lucile Crater, MD       heparin sodium (porcine) injection    PRN Belva Crome, MD   3,500 Units at 04/24/2021 1445   [START ON 05/04/2021] insulin regular, human (MYXREDLIN) 100 units/ 100 mL infusion   Intravenous To OR Lajuana Matte, MD       [START ON 05/12/2021] [place] Blood Cardioplegia vial (lidocaine/magnesium/mannitol 0.26g-4g-6.4g)   Intracoronary To OR Lightfoot, Lucile Crater, MD       lidocaine (PF) (XYLOCAINE) 1 % injection    PRN Belva Crome, MD   2 mL at 04/19/2021 1441   [START ON 05/03/2021] magnesium sulfate (IV Push/IM) injection 40 mEq  40 mEq Other To OR Lajuana Matte, MD       midazolam (VERSED) injection    PRN Belva Crome, MD   0.5 mg at 05/10/2021 1443   [START ON 05/15/2021] milrinone (PRIMACOR) 20 MG/100 ML (0.2 mg/mL) infusion  0.3 mcg/kg/min Intravenous To OR Lightfoot, Lucile Crater, MD       morphine 2 MG/ML injection 2 mg  2 mg Intravenous Q1H PRN Belva Crome, MD   2 mg at 04/20/2021 1614   [MAR Hold] nitroGLYCERIN (NITROGLYN) 2 % ointment 1 inch  1 inch Topical Once Malvin Johns, MD       Doug Sou Hold] nitroGLYCERIN (NITROSTAT) SL tablet 0.4 mg  0.4 mg Sublingual Q5 Min x 3 PRN Malvin Johns, MD   0.4 mg at 04/25/2021 1351   [START ON 04/27/2021] nitroGLYCERIN 50 mg in dextrose 5 % 250 mL (0.2 mg/mL) infusion  2-200 mcg/min Intravenous To OR Lightfoot, Harrell O, MD       nitroGLYCERIN 50 mg in dextrose 5 % 250 mL (0.2 mg/mL) infusion  10-80 mcg/min Intravenous Titrated Belva Crome, MD 9 mL/hr at 05/19/2021 1603 30 mcg/min at 05/15/2021 1603   [START ON 04/30/2021] norepinephrine (LEVOPHED) 4mg  in 252mL (0.016 mg/mL) premix infusion  0-40 mcg/min Intravenous To OR Lajuana Matte, MD       [START ON 05/01/2021] phenylephrine (NEO-SYNEPHRINE) 20mg /NS 212mL premix infusion   30-200  mcg/min Intravenous To OR Lajuana Matte, MD       [START ON 05/07/2021] potassium chloride injection 80 mEq  80 mEq Other To OR Lightfoot, Lucile Crater, MD       Radial Cocktail/Verapamil only    PRN Belva Crome, MD   10 mL at 05/13/2021 1441   [START ON 04/25/2021] tranexamic acid (CYKLOKAPRON) 2,500 mg in sodium chloride 0.9 % 250 mL (10 mg/mL) infusion  1.5 mg/kg/hr Intravenous To OR Lajuana Matte, MD       [START ON 05/14/2021] tranexamic acid (CYKLOKAPRON) bolus via infusion - over 30 minutes 1,177.5 mg  15 mg/kg Intravenous To OR Lajuana Matte, MD       [START ON 05/17/2021] tranexamic acid (CYKLOKAPRON) pump prime solution 157 mg  2 mg/kg Intracatheter To OR Lightfoot, Lucile Crater, MD       [START ON 04/21/2021] vancomycin (VANCOREADY) IVPB 1250 mg/250 mL  1,250 mg Intravenous Once Lajuana Matte, MD        Medications Prior to Admission  Medication Sig Dispense Refill Last Dose   albuterol (VENTOLIN HFA) 108 (90 Base) MCG/ACT inhaler SMARTSIG:1-2 Puff(s) Via Inhaler Every 4 Hours PRN      aspirin EC 81 MG tablet Take 1 tablet (81 mg total) by mouth daily. Swallow whole. 90 tablet 0    cetirizine (ZYRTEC) 10 MG tablet Take 10 mg by mouth daily.      metoprolol tartrate (LOPRESSOR) 100 MG tablet Take 1 tablet (100 mg total) by mouth 2 (two) times daily. 180 tablet 3    omeprazole (PRILOSEC) 40 MG capsule Take 40 mg by mouth as needed.      rosuvastatin (CRESTOR) 10 MG tablet Take 1 tablet (10 mg total) by mouth daily. 30 tablet 4     Family History  Problem Relation Age of Onset   Diabetes Father    Prostate cancer Father 59       had an enlarged prostate and had it removed   Hypertension Paternal Grandmother    Prostate cancer Brother 63       metastatic to bone   Breast cancer Neg Hx    Colon cancer Neg Hx    Pancreatic cancer Neg Hx      Review of Systems:   ROS      Cardiac Review of Systems: Y or  [    ]= no  Chest Pain [   y ]  Resting SOB [    ] Exertional SOB  [  ]  Orthopnea [  ]   Pedal Edema [   ]    Palpitations [  y] Syncope  [  ]   Presyncope [   ]  General Review of Systems: [Y] = yes [  ]=no Constitional: recent weight change [  ]; anorexia [  ]; fatigue [  ]; nausea [  ]; night sweats [  ]; fever [  ]; or chills [  ]                                                               Dental: Last Dentist visit:   Eye : blurred vision [  ]; diplopia [   ]; vision changes [  ];  Amaurosis fugax[  ];  Resp: cough [  ];  wheezing[  ];  hemoptysis[  ]; shortness of breath[  ]; paroxysmal nocturnal dyspnea[  ]; dyspnea on exertion[  ]; or orthopnea[  ];  GI:  gallstones[  ], vomiting[  ];  dysphagia[  ]; melena[  ];  hematochezia [  ]; heartburn[  ];   Hx of  Colonoscopy[  ]; GU: kidney stones [  ]; hematuria[  ];   dysuria [  ];  nocturia[  ];  history of     obstruction [  ]; urinary frequency [  ]             Skin: rash, swelling[  ];, hair loss[  ];  peripheral edema[  ];  or itching[  ]; Musculosketetal: myalgias[  ];  joint swelling[  ];  joint erythema[  ];  joint pain[  ];  back pain[  ];  Heme/Lymph: bruising[  ];  bleeding[  ];  anemia[  ];  Neuro: TIA[  ];  headaches[  ];  stroke[  ];  vertigo[  ];  seizures[  ];   paresthesias[  ];  difficulty walking[  ];  Psych:depression[  ]; anxiety[  ];  Endocrine: diabetes[  ];  thyroid dysfunction[  ];            Dental : negative                Physical Exam: BP 124/85    Pulse 69    Temp (!) 97.4 F (36.3 C)    Resp 19    Ht 5\' 10"  (1.778 m)    Wt 78.5 kg    SpO2 100%    BMI 24.82 kg/m       Physical Exam  General: Well-nourished 71 year old male who appears to be in good physical condition but anxious in the Cath Lab holding following left heart cath HEENT: Normocephalic pupils equal , dentition adequate Neck: Supple without JVD, adenopathy, or bruit Chest: Clear to auscultation, symmetrical breath sounds, no rhonchi, no tenderness             or  deformity Cardiovascular: Regular rate and rhythm, no murmur, no gallop, peripheral pulses             palpable in all extremities Abdomen:  Soft, nontender, no palpable mass or organomegaly Extremities: Warm, well-perfused, no clubbing cyanosis edema or tenderness,              no venous stasis changes of the legs Rectal/GU: Deferred Neuro: Grossly non--focal and symmetrical throughout Skin: Clean and dry without rash or ulceration    Diagnostic Studies & Laboratory data:     Recent Radiology Findings:   CARDIAC CATHETERIZATION  Result Date: 05/11/2021 CONCLUSIONS: Severe left coronary calcification including left main circumflex and LAD. Severe distal left main bifurcation disease involving ostial LAD and ostial circumflex ([place] 111). LAD is heavily calcified with ostial 90%, and tandem mid 60% and 80% more distal. 70% mid circumflex.  Second obtuse marginal 60%. Nondominant right coronary Normal LV function and LVEDP RECOMMENDATIONS: Ongoing ambiguous chest discomfort with normal troponin I but with dynamic EKG changes IV nitroglycerin and IV heparin. Discussed case with Dr. Roxan Hockey in the Cath Lab.  Shared decision to have coronary bypass surgery soon but currently does not meet criteria for emergency CABG.   DG Chest Port 1 View  Result Date: 05/16/2021 CLINICAL DATA:  Chest pain, STEMI EXAM: PORTABLE CHEST 1 VIEW COMPARISON:  Portable exam 1348 hours compared to  CT chest of 06/03/2017 FINDINGS: Normal heart size mediastinal contours. BILATERAL pleuroparenchymal scarring. Peripheral opacities in both lungs, similar to prior CT exam, likely peripheral chronic interstitial lung disease changes. No new infiltrate, pleural effusion, or pneumothorax. Bones demineralized. IMPRESSION: Peripheral chronic interstitial lung disease changes with biapical scarring, similar to prior CT. No acute abnormalities Electronically Signed   By: Lavonia Dana M.D.   On: 05/07/2021 13:57     I have  independently reviewed the above radiologic studies and discussed with the patient   Recent Lab Findings: Lab Results  Component Value Date   WBC 9.5 04/23/2021   HGB 14.0 05/12/2021   HCT 42.6 04/27/2021   PLT 181 04/22/2021   GLUCOSE 97 05/19/2021   ALT 19 08/08/2020   AST 23 08/08/2020   NA 140 05/11/2021   K 4.2 05/11/2021   CL 103 04/20/2021   CREATININE 1.19 04/30/2021   BUN 18 04/19/2021   CO2 28 05/04/2021   INR 1.1 05/07/2021      Assessment / Plan:   Severe left main and three-vessel coronary artery disease Acute coronary syndrome History of prostate cancer   I discussed the patient in the Cath Lab holding area with Dr. Daneen Schick for coordination of care.  A follow-up twelve-lead EKG was performed that showed no acute changes.  The plan will be to admit the patient to the ICU for stabilization and observation and prepare for multivessel CABG in a.m.  I have discussed this plan with the patient including the information regarding the expected benefits of CABG in the associated risks.  He understands and wishes to proceed with surgery which will be planned in the a.m. unless his clinical condition changes before that time.  I  spent 30 min counseling the patient face to face.   @ME1 @ 05/04/2021 4:18 PM

## 2021-05-06 ENCOUNTER — Inpatient Hospital Stay (HOSPITAL_COMMUNITY): Payer: PPO | Admitting: Certified Registered"

## 2021-05-06 ENCOUNTER — Inpatient Hospital Stay (HOSPITAL_COMMUNITY): Admission: EM | Disposition: E | Payer: Self-pay | Source: Home / Self Care | Attending: Cardiothoracic Surgery

## 2021-05-06 ENCOUNTER — Inpatient Hospital Stay (HOSPITAL_COMMUNITY): Payer: PPO

## 2021-05-06 ENCOUNTER — Encounter (HOSPITAL_COMMUNITY): Payer: Self-pay | Admitting: Interventional Cardiology

## 2021-05-06 DIAGNOSIS — I2510 Atherosclerotic heart disease of native coronary artery without angina pectoris: Secondary | ICD-10-CM | POA: Diagnosis not present

## 2021-05-06 DIAGNOSIS — Z951 Presence of aortocoronary bypass graft: Secondary | ICD-10-CM

## 2021-05-06 HISTORY — PX: CORONARY ARTERY BYPASS GRAFT: SHX141

## 2021-05-06 HISTORY — PX: TEE WITHOUT CARDIOVERSION: SHX5443

## 2021-05-06 HISTORY — PX: ENDOVEIN HARVEST OF GREATER SAPHENOUS VEIN: SHX5059

## 2021-05-06 LAB — CBC
HCT: 34.2 % — ABNORMAL LOW (ref 39.0–52.0)
HCT: 23.8 % — ABNORMAL LOW (ref 39.0–52.0)
HCT: 23.2 % — ABNORMAL LOW (ref 39.0–52.0)
Hemoglobin: 11.1 g/dL — ABNORMAL LOW (ref 13.0–17.0)
Hemoglobin: 8.1 g/dL — ABNORMAL LOW (ref 13.0–17.0)
Hemoglobin: 7.5 g/dL — ABNORMAL LOW (ref 13.0–17.0)
MCH: 30 pg (ref 26.0–34.0)
MCH: 31.5 pg (ref 26.0–34.0)
MCH: 30.2 pg (ref 26.0–34.0)
MCHC: 32.5 g/dL (ref 30.0–36.0)
MCHC: 34 g/dL (ref 30.0–36.0)
MCHC: 32.3 g/dL (ref 30.0–36.0)
MCV: 92.4 fL (ref 80.0–100.0)
MCV: 92.6 fL (ref 80.0–100.0)
MCV: 93.5 fL (ref 80.0–100.0)
Platelets: 127 10*3/uL — ABNORMAL LOW (ref 150–400)
Platelets: 57 10*3/uL — ABNORMAL LOW (ref 150–400)
Platelets: 62 10*3/uL — ABNORMAL LOW (ref 150–400)
RBC: 3.7 MIL/uL — ABNORMAL LOW (ref 4.22–5.81)
RBC: 2.57 MIL/uL — ABNORMAL LOW (ref 4.22–5.81)
RBC: 2.48 MIL/uL — ABNORMAL LOW (ref 4.22–5.81)
RDW: 13.2 % (ref 11.5–15.5)
RDW: 13 % (ref 11.5–15.5)
RDW: 13.1 % (ref 11.5–15.5)
WBC: 8.6 10*3/uL (ref 4.0–10.5)
WBC: 12.9 10*3/uL — ABNORMAL HIGH (ref 4.0–10.5)
WBC: 10.1 10*3/uL (ref 4.0–10.5)
nRBC: 0 % (ref 0.0–0.2)
nRBC: 0.2 % (ref 0.0–0.2)
nRBC: 0 % (ref 0.0–0.2)

## 2021-05-06 LAB — POCT I-STAT, CHEM 8
BUN: 12 mg/dL (ref 8–23)
BUN: 12 mg/dL (ref 8–23)
BUN: 11 mg/dL (ref 8–23)
BUN: 11 mg/dL (ref 8–23)
BUN: 11 mg/dL (ref 8–23)
BUN: 10 mg/dL (ref 8–23)
BUN: 10 mg/dL (ref 8–23)
Calcium, Ion: 1.2 mmol/L (ref 1.15–1.40)
Calcium, Ion: 1.23 mmol/L (ref 1.15–1.40)
Calcium, Ion: 1.08 mmol/L — ABNORMAL LOW (ref 1.15–1.40)
Calcium, Ion: 1.03 mmol/L — ABNORMAL LOW (ref 1.15–1.40)
Calcium, Ion: 1.09 mmol/L — ABNORMAL LOW (ref 1.15–1.40)
Calcium, Ion: 1.24 mmol/L (ref 1.15–1.40)
Calcium, Ion: 1.23 mmol/L (ref 1.15–1.40)
Chloride: 100 mmol/L (ref 98–111)
Chloride: 102 mmol/L (ref 98–111)
Chloride: 102 mmol/L (ref 98–111)
Chloride: 101 mmol/L (ref 98–111)
Chloride: 101 mmol/L (ref 98–111)
Chloride: 102 mmol/L (ref 98–111)
Chloride: 103 mmol/L (ref 98–111)
Creatinine, Ser: 0.7 mg/dL (ref 0.61–1.24)
Creatinine, Ser: 0.7 mg/dL (ref 0.61–1.24)
Creatinine, Ser: 0.6 mg/dL — ABNORMAL LOW (ref 0.61–1.24)
Creatinine, Ser: 0.6 mg/dL — ABNORMAL LOW (ref 0.61–1.24)
Creatinine, Ser: 0.7 mg/dL (ref 0.61–1.24)
Creatinine, Ser: 0.7 mg/dL (ref 0.61–1.24)
Creatinine, Ser: 0.6 mg/dL — ABNORMAL LOW (ref 0.61–1.24)
Glucose, Bld: 143 mg/dL — ABNORMAL HIGH (ref 70–99)
Glucose, Bld: 106 mg/dL — ABNORMAL HIGH (ref 70–99)
Glucose, Bld: 108 mg/dL — ABNORMAL HIGH (ref 70–99)
Glucose, Bld: 137 mg/dL — ABNORMAL HIGH (ref 70–99)
Glucose, Bld: 131 mg/dL — ABNORMAL HIGH (ref 70–99)
Glucose, Bld: 138 mg/dL — ABNORMAL HIGH (ref 70–99)
Glucose, Bld: 129 mg/dL — ABNORMAL HIGH (ref 70–99)
HCT: 32 % — ABNORMAL LOW (ref 39.0–52.0)
HCT: 31 % — ABNORMAL LOW (ref 39.0–52.0)
HCT: 26 % — ABNORMAL LOW (ref 39.0–52.0)
HCT: 26 % — ABNORMAL LOW (ref 39.0–52.0)
HCT: 24 % — ABNORMAL LOW (ref 39.0–52.0)
HCT: 25 % — ABNORMAL LOW (ref 39.0–52.0)
HCT: 29 % — ABNORMAL LOW (ref 39.0–52.0)
Hemoglobin: 10.9 g/dL — ABNORMAL LOW (ref 13.0–17.0)
Hemoglobin: 10.5 g/dL — ABNORMAL LOW (ref 13.0–17.0)
Hemoglobin: 8.8 g/dL — ABNORMAL LOW (ref 13.0–17.0)
Hemoglobin: 8.8 g/dL — ABNORMAL LOW (ref 13.0–17.0)
Hemoglobin: 8.2 g/dL — ABNORMAL LOW (ref 13.0–17.0)
Hemoglobin: 8.5 g/dL — ABNORMAL LOW (ref 13.0–17.0)
Hemoglobin: 9.9 g/dL — ABNORMAL LOW (ref 13.0–17.0)
Potassium: 3.7 mmol/L (ref 3.5–5.1)
Potassium: 3.8 mmol/L (ref 3.5–5.1)
Potassium: 5.7 mmol/L — ABNORMAL HIGH (ref 3.5–5.1)
Potassium: 4.6 mmol/L (ref 3.5–5.1)
Potassium: 4.4 mmol/L (ref 3.5–5.1)
Potassium: 3.7 mmol/L (ref 3.5–5.1)
Potassium: 3.7 mmol/L (ref 3.5–5.1)
Sodium: 135 mmol/L (ref 135–145)
Sodium: 136 mmol/L (ref 135–145)
Sodium: 135 mmol/L (ref 135–145)
Sodium: 134 mmol/L — ABNORMAL LOW (ref 135–145)
Sodium: 135 mmol/L (ref 135–145)
Sodium: 138 mmol/L (ref 135–145)
Sodium: 138 mmol/L (ref 135–145)
TCO2: 25 mmol/L (ref 22–32)
TCO2: 26 mmol/L (ref 22–32)
TCO2: 27 mmol/L (ref 22–32)
TCO2: 25 mmol/L (ref 22–32)
TCO2: 26 mmol/L (ref 22–32)
TCO2: 25 mmol/L (ref 22–32)
TCO2: 26 mmol/L (ref 22–32)

## 2021-05-06 LAB — POCT I-STAT 7, (LYTES, BLD GAS, ICA,H+H)
Acid-Base Excess: 0 mmol/L (ref 0.0–2.0)
Acid-Base Excess: 2 mmol/L (ref 0.0–2.0)
Acid-Base Excess: 1 mmol/L (ref 0.0–2.0)
Acid-Base Excess: 3 mmol/L — ABNORMAL HIGH (ref 0.0–2.0)
Acid-Base Excess: 1 mmol/L (ref 0.0–2.0)
Acid-Base Excess: 2 mmol/L (ref 0.0–2.0)
Acid-Base Excess: 1 mmol/L (ref 0.0–2.0)
Acid-Base Excess: 1 mmol/L (ref 0.0–2.0)
Acid-Base Excess: 1 mmol/L (ref 0.0–2.0)
Acid-base deficit: 3 mmol/L — ABNORMAL HIGH (ref 0.0–2.0)
Bicarbonate: 25.5 mmol/L (ref 20.0–28.0)
Bicarbonate: 26 mmol/L (ref 20.0–28.0)
Bicarbonate: 24.7 mmol/L (ref 20.0–28.0)
Bicarbonate: 26.8 mmol/L (ref 20.0–28.0)
Bicarbonate: 24.9 mmol/L (ref 20.0–28.0)
Bicarbonate: 25.3 mmol/L (ref 20.0–28.0)
Bicarbonate: 25.3 mmol/L (ref 20.0–28.0)
Bicarbonate: 25.6 mmol/L (ref 20.0–28.0)
Bicarbonate: 26 mmol/L (ref 20.0–28.0)
Bicarbonate: 23.3 mmol/L (ref 20.0–28.0)
Calcium, Ion: 1.24 mmol/L (ref 1.15–1.40)
Calcium, Ion: 0.97 mmol/L — ABNORMAL LOW (ref 1.15–1.40)
Calcium, Ion: 1.04 mmol/L — ABNORMAL LOW (ref 1.15–1.40)
Calcium, Ion: 1.08 mmol/L — ABNORMAL LOW (ref 1.15–1.40)
Calcium, Ion: 1.03 mmol/L — ABNORMAL LOW (ref 1.15–1.40)
Calcium, Ion: 1.07 mmol/L — ABNORMAL LOW (ref 1.15–1.40)
Calcium, Ion: 1.09 mmol/L — ABNORMAL LOW (ref 1.15–1.40)
Calcium, Ion: 1.1 mmol/L — ABNORMAL LOW (ref 1.15–1.40)
Calcium, Ion: 1.24 mmol/L (ref 1.15–1.40)
Calcium, Ion: 1.13 mmol/L — ABNORMAL LOW (ref 1.15–1.40)
HCT: 32 % — ABNORMAL LOW (ref 39.0–52.0)
HCT: 25 % — ABNORMAL LOW (ref 39.0–52.0)
HCT: 24 % — ABNORMAL LOW (ref 39.0–52.0)
HCT: 26 % — ABNORMAL LOW (ref 39.0–52.0)
HCT: 24 % — ABNORMAL LOW (ref 39.0–52.0)
HCT: 25 % — ABNORMAL LOW (ref 39.0–52.0)
HCT: 24 % — ABNORMAL LOW (ref 39.0–52.0)
HCT: 25 % — ABNORMAL LOW (ref 39.0–52.0)
HCT: 21 % — ABNORMAL LOW (ref 39.0–52.0)
HCT: 26 % — ABNORMAL LOW (ref 39.0–52.0)
Hemoglobin: 10.9 g/dL — ABNORMAL LOW (ref 13.0–17.0)
Hemoglobin: 8.5 g/dL — ABNORMAL LOW (ref 13.0–17.0)
Hemoglobin: 8.2 g/dL — ABNORMAL LOW (ref 13.0–17.0)
Hemoglobin: 8.8 g/dL — ABNORMAL LOW (ref 13.0–17.0)
Hemoglobin: 8.2 g/dL — ABNORMAL LOW (ref 13.0–17.0)
Hemoglobin: 8.5 g/dL — ABNORMAL LOW (ref 13.0–17.0)
Hemoglobin: 8.2 g/dL — ABNORMAL LOW (ref 13.0–17.0)
Hemoglobin: 8.5 g/dL — ABNORMAL LOW (ref 13.0–17.0)
Hemoglobin: 7.1 g/dL — ABNORMAL LOW (ref 13.0–17.0)
Hemoglobin: 8.8 g/dL — ABNORMAL LOW (ref 13.0–17.0)
O2 Saturation: 100 %
O2 Saturation: 100 %
O2 Saturation: 100 %
O2 Saturation: 100 %
O2 Saturation: 100 %
O2 Saturation: 100 %
O2 Saturation: 100 %
O2 Saturation: 100 %
O2 Saturation: 100 %
O2 Saturation: 100 %
Patient temperature: 35.7
Potassium: 3.8 mmol/L (ref 3.5–5.1)
Potassium: 4.1 mmol/L (ref 3.5–5.1)
Potassium: 3.9 mmol/L (ref 3.5–5.1)
Potassium: 4.3 mmol/L (ref 3.5–5.1)
Potassium: 4.5 mmol/L (ref 3.5–5.1)
Potassium: 4.5 mmol/L (ref 3.5–5.1)
Potassium: 4.2 mmol/L (ref 3.5–5.1)
Potassium: 4.4 mmol/L (ref 3.5–5.1)
Potassium: 3.7 mmol/L (ref 3.5–5.1)
Potassium: 3.8 mmol/L (ref 3.5–5.1)
Sodium: 135 mmol/L (ref 135–145)
Sodium: 137 mmol/L (ref 135–145)
Sodium: 135 mmol/L (ref 135–145)
Sodium: 136 mmol/L (ref 135–145)
Sodium: 134 mmol/L — ABNORMAL LOW (ref 135–145)
Sodium: 135 mmol/L (ref 135–145)
Sodium: 136 mmol/L (ref 135–145)
Sodium: 136 mmol/L (ref 135–145)
Sodium: 138 mmol/L (ref 135–145)
Sodium: 139 mmol/L (ref 135–145)
TCO2: 27 mmol/L (ref 22–32)
TCO2: 27 mmol/L (ref 22–32)
TCO2: 26 mmol/L (ref 22–32)
TCO2: 28 mmol/L (ref 22–32)
TCO2: 26 mmol/L (ref 22–32)
TCO2: 26 mmol/L (ref 22–32)
TCO2: 26 mmol/L (ref 22–32)
TCO2: 27 mmol/L (ref 22–32)
TCO2: 27 mmol/L (ref 22–32)
TCO2: 25 mmol/L (ref 22–32)
pCO2 arterial: 45.8 mmHg (ref 32.0–48.0)
pCO2 arterial: 36.9 mmHg (ref 32.0–48.0)
pCO2 arterial: 33.1 mmHg (ref 32.0–48.0)
pCO2 arterial: 35.1 mmHg (ref 32.0–48.0)
pCO2 arterial: 34.8 mmHg (ref 32.0–48.0)
pCO2 arterial: 36.3 mmHg (ref 32.0–48.0)
pCO2 arterial: 38.2 mmHg (ref 32.0–48.0)
pCO2 arterial: 39 mmHg (ref 32.0–48.0)
pCO2 arterial: 41.4 mmHg (ref 32.0–48.0)
pCO2 arterial: 41.8 mmHg (ref 32.0–48.0)
pH, Arterial: 7.354 (ref 7.350–7.450)
pH, Arterial: 7.457 — ABNORMAL HIGH (ref 7.350–7.450)
pH, Arterial: 7.481 — ABNORMAL HIGH (ref 7.350–7.450)
pH, Arterial: 7.491 — ABNORMAL HIGH (ref 7.350–7.450)
pH, Arterial: 7.445 (ref 7.350–7.450)
pH, Arterial: 7.47 — ABNORMAL HIGH (ref 7.350–7.450)
pH, Arterial: 7.42 (ref 7.350–7.450)
pH, Arterial: 7.434 (ref 7.350–7.450)
pH, Arterial: 7.406 (ref 7.350–7.450)
pH, Arterial: 7.348 — ABNORMAL LOW (ref 7.350–7.450)
pO2, Arterial: 360 mmHg — ABNORMAL HIGH (ref 83.0–108.0)
pO2, Arterial: 443 mmHg — ABNORMAL HIGH (ref 83.0–108.0)
pO2, Arterial: 342 mmHg — ABNORMAL HIGH (ref 83.0–108.0)
pO2, Arterial: 353 mmHg — ABNORMAL HIGH (ref 83.0–108.0)
pO2, Arterial: 332 mmHg — ABNORMAL HIGH (ref 83.0–108.0)
pO2, Arterial: 344 mmHg — ABNORMAL HIGH (ref 83.0–108.0)
pO2, Arterial: 316 mmHg — ABNORMAL HIGH (ref 83.0–108.0)
pO2, Arterial: 340 mmHg — ABNORMAL HIGH (ref 83.0–108.0)
pO2, Arterial: 503 mmHg — ABNORMAL HIGH (ref 83.0–108.0)
pO2, Arterial: 173 mmHg — ABNORMAL HIGH (ref 83.0–108.0)

## 2021-05-06 LAB — ECHO INTRAOPERATIVE TEE
AV Mean grad: 3 mmHg
Height: 70 in
Weight: 2737.23 oz

## 2021-05-06 LAB — BASIC METABOLIC PANEL
Anion gap: 5 (ref 5–15)
Anion gap: 9 (ref 5–15)
BUN: 13 mg/dL (ref 8–23)
BUN: 10 mg/dL (ref 8–23)
CO2: 25 mmol/L (ref 22–32)
CO2: 21 mmol/L — ABNORMAL LOW (ref 22–32)
Calcium: 8.2 mg/dL — ABNORMAL LOW (ref 8.9–10.3)
Calcium: 7.6 mg/dL — ABNORMAL LOW (ref 8.9–10.3)
Chloride: 104 mmol/L (ref 98–111)
Chloride: 105 mmol/L (ref 98–111)
Creatinine, Ser: 1.08 mg/dL (ref 0.61–1.24)
Creatinine, Ser: 1.13 mg/dL (ref 0.61–1.24)
GFR, Estimated: >60 mL/min (ref >60)
GFR, Estimated: >60 mL/min (ref >60)
Glucose, Bld: 121 mg/dL — ABNORMAL HIGH (ref 70–99)
Glucose, Bld: 184 mg/dL — ABNORMAL HIGH (ref 70–99)
Potassium: 4.4 mmol/L (ref 3.5–5.1)
Potassium: 4.4 mmol/L (ref 3.5–5.1)
Sodium: 134 mmol/L — ABNORMAL LOW (ref 135–145)
Sodium: 135 mmol/L (ref 135–145)

## 2021-05-06 LAB — GLUCOSE, CAPILLARY
Glucose-Capillary: 145 mg/dL — ABNORMAL HIGH (ref 70–99)
Glucose-Capillary: 168 mg/dL — ABNORMAL HIGH (ref 70–99)
Glucose-Capillary: 169 mg/dL — ABNORMAL HIGH (ref 70–99)
Glucose-Capillary: 172 mg/dL — ABNORMAL HIGH (ref 70–99)
Glucose-Capillary: 185 mg/dL — ABNORMAL HIGH (ref 70–99)
Glucose-Capillary: 193 mg/dL — ABNORMAL HIGH (ref 70–99)
Glucose-Capillary: 198 mg/dL — ABNORMAL HIGH (ref 70–99)

## 2021-05-06 LAB — BPAM PLATELET PHERESIS
Blood Product Expiration Date: 202301192359
ISSUE DATE / TIME: 202301181536
Unit Type and Rh: 5100

## 2021-05-06 LAB — PREPARE PLATELET PHERESIS: Unit division: 0

## 2021-05-06 LAB — MAGNESIUM: Magnesium: 2.8 mg/dL — ABNORMAL HIGH (ref 1.7–2.4)

## 2021-05-06 LAB — POCT I-STAT EG7
Acid-Base Excess: 1 mmol/L (ref 0.0–2.0)
Bicarbonate: 25.4 mmol/L (ref 20.0–28.0)
Calcium, Ion: 1.04 mmol/L — ABNORMAL LOW (ref 1.15–1.40)
HCT: 25 % — ABNORMAL LOW (ref 39.0–52.0)
Hemoglobin: 8.5 g/dL — ABNORMAL LOW (ref 13.0–17.0)
O2 Saturation: 81 %
Potassium: 3.9 mmol/L (ref 3.5–5.1)
Sodium: 137 mmol/L (ref 135–145)
TCO2: 27 mmol/L (ref 22–32)
pCO2, Ven: 39.1 mmHg — ABNORMAL LOW (ref 44.0–60.0)
pH, Ven: 7.421 (ref 7.250–7.430)
pO2, Ven: 45 mmHg (ref 32.0–45.0)

## 2021-05-06 LAB — HEMOGLOBIN AND HEMATOCRIT, BLOOD
HCT: 23.4 % — ABNORMAL LOW (ref 39.0–52.0)
Hemoglobin: 7.9 g/dL — ABNORMAL LOW (ref 13.0–17.0)

## 2021-05-06 LAB — PREPARE RBC (CROSSMATCH)

## 2021-05-06 LAB — APTT: aPTT: 36 seconds (ref 24–36)

## 2021-05-06 LAB — PROTIME-INR
INR: 1.5 — ABNORMAL HIGH (ref 0.8–1.2)
Prothrombin Time: 18 seconds — ABNORMAL HIGH (ref 11.4–15.2)

## 2021-05-06 LAB — PLATELET COUNT: Platelets: 94 10*3/uL — ABNORMAL LOW (ref 150–400)

## 2021-05-06 SURGERY — CORONARY ARTERY BYPASS GRAFTING (CABG)
Anesthesia: General | Site: Chest | Laterality: Right

## 2021-05-06 MED ORDER — FAMOTIDINE IN NACL 20-0.9 MG/50ML-% IV SOLN
20.0000 mg | Freq: Two times a day (BID) | INTRAVENOUS | Status: AC
  Administered 2021-05-06 (×2): 20 mg via INTRAVENOUS
  Filled 2021-05-06: qty 50

## 2021-05-06 MED ORDER — VANCOMYCIN HCL IN DEXTROSE 1-5 GM/200ML-% IV SOLN
1000.0000 mg | Freq: Once | INTRAVENOUS | Status: AC
  Administered 2021-05-06: 1000 mg via INTRAVENOUS
  Filled 2021-05-06: qty 200

## 2021-05-06 MED ORDER — CEFAZOLIN SODIUM-DEXTROSE 2-4 GM/100ML-% IV SOLN
2.0000 g | Freq: Three times a day (TID) | INTRAVENOUS | Status: AC
  Administered 2021-05-06 – 2021-05-08 (×6): 2 g via INTRAVENOUS
  Filled 2021-05-06 (×5): qty 100

## 2021-05-06 MED ORDER — MIDAZOLAM HCL (PF) 5 MG/ML IJ SOLN
INTRAMUSCULAR | Status: DC | PRN
  Administered 2021-05-06 (×2): 1 mg via INTRAVENOUS
  Administered 2021-05-06: 3 mg via INTRAVENOUS
  Administered 2021-05-06: 1 mg via INTRAVENOUS
  Administered 2021-05-06 (×2): 2 mg via INTRAVENOUS

## 2021-05-06 MED ORDER — ACETAMINOPHEN 650 MG RE SUPP
650.0000 mg | Freq: Once | RECTAL | Status: AC
  Administered 2021-05-06: 650 mg via RECTAL

## 2021-05-06 MED ORDER — NOREPINEPHRINE 4 MG/250ML-% IV SOLN
0.0000 ug/min | INTRAVENOUS | Status: AC
  Administered 2021-05-06: 13 ug/min via INTRAVENOUS
  Filled 2021-05-06: qty 250

## 2021-05-06 MED ORDER — ALBUMIN HUMAN 5 % IV SOLN
250.0000 mL | INTRAVENOUS | Status: AC | PRN
  Administered 2021-05-06 (×4): 12.5 g via INTRAVENOUS
  Filled 2021-05-06 (×2): qty 250

## 2021-05-06 MED ORDER — NOREPINEPHRINE 4 MG/250ML-% IV SOLN
0.0000 ug/min | INTRAVENOUS | Status: DC
  Administered 2021-05-07: 9 ug/min via INTRAVENOUS
  Administered 2021-05-07: 15 ug/min via INTRAVENOUS
  Filled 2021-05-06 (×3): qty 250

## 2021-05-06 MED ORDER — EPINEPHRINE HCL 5 MG/250ML IV SOLN IN NS
0.0000 ug/min | INTRAVENOUS | Status: DC
  Administered 2021-05-07: 3 ug/min via INTRAVENOUS
  Filled 2021-05-06: qty 250

## 2021-05-06 MED ORDER — METOPROLOL TARTRATE 12.5 MG HALF TABLET
12.5000 mg | ORAL_TABLET | Freq: Two times a day (BID) | ORAL | Status: DC
  Administered 2021-05-08 – 2021-05-09 (×2): 12.5 mg via ORAL
  Filled 2021-05-06 (×5): qty 1

## 2021-05-06 MED ORDER — DEXTROSE 50 % IV SOLN: 0.0000 mL | INTRAVENOUS | Status: DC | PRN

## 2021-05-06 MED ORDER — PROPOFOL 10 MG/ML IV BOLUS
INTRAVENOUS | Status: AC
  Filled 2021-05-06: qty 20

## 2021-05-06 MED ORDER — ROCURONIUM BROMIDE 10 MG/ML (PF) SYRINGE
PREFILLED_SYRINGE | INTRAVENOUS | Status: AC
  Filled 2021-05-06: qty 10

## 2021-05-06 MED ORDER — INSULIN REGULAR(HUMAN) IN NACL 100-0.9 UT/100ML-% IV SOLN
INTRAVENOUS | Status: DC
  Administered 2021-05-07: 2.2 [IU]/h via INTRAVENOUS
  Filled 2021-05-06: qty 100

## 2021-05-06 MED ORDER — ACETAMINOPHEN 160 MG/5ML PO SOLN
1000.0000 mg | Freq: Four times a day (QID) | ORAL | Status: AC
  Administered 2021-05-07 (×2): 1000 mg
  Filled 2021-05-06 (×2): qty 40.6

## 2021-05-06 MED ORDER — FENTANYL CITRATE (PF) 250 MCG/5ML IJ SOLN
INTRAMUSCULAR | Status: AC
  Filled 2021-05-06: qty 5

## 2021-05-06 MED ORDER — BISACODYL 10 MG RE SUPP: 10.0000 mg | Freq: Every day | RECTAL | Status: DC

## 2021-05-06 MED ORDER — PLASMA-LYTE A IV SOLN
INTRAVENOUS | Status: DC | PRN
  Administered 2021-05-06: 1000 mL via INTRAVASCULAR

## 2021-05-06 MED ORDER — SODIUM CHLORIDE 0.9% IV SOLUTION: Freq: Once | INTRAVENOUS | Status: AC

## 2021-05-06 MED ORDER — ROCURONIUM BROMIDE 10 MG/ML (PF) SYRINGE
PREFILLED_SYRINGE | INTRAVENOUS | Status: DC | PRN
  Administered 2021-05-06: 50 mg via INTRAVENOUS
  Administered 2021-05-06: 40 mg via INTRAVENOUS
  Administered 2021-05-06: 30 mg via INTRAVENOUS
  Administered 2021-05-06: 20 mg via INTRAVENOUS
  Administered 2021-05-06: 60 mg via INTRAVENOUS
  Administered 2021-05-06: 20 mg via INTRAVENOUS
  Administered 2021-05-06: 30 mg via INTRAVENOUS
  Administered 2021-05-06: 40 mg via INTRAVENOUS
  Administered 2021-05-06: 60 mg via INTRAVENOUS

## 2021-05-06 MED ORDER — HEMOSTATIC AGENTS (NO CHARGE) OPTIME
TOPICAL | Status: DC | PRN
  Administered 2021-05-06: 1 via TOPICAL

## 2021-05-06 MED ORDER — SODIUM CHLORIDE 0.9% FLUSH: 10.0000 mL | INTRAVENOUS | Status: DC | PRN

## 2021-05-06 MED ORDER — OXYCODONE HCL 5 MG PO TABS
5.0000 mg | ORAL_TABLET | ORAL | Status: DC | PRN
  Administered 2021-05-07: 5 mg via ORAL
  Administered 2021-05-07 – 2021-05-09 (×4): 10 mg via ORAL
  Administered 2021-05-10 – 2021-05-11 (×3): 5 mg via ORAL
  Administered 2021-05-11 – 2021-05-13 (×7): 10 mg via ORAL
  Filled 2021-05-06 (×2): qty 2
  Filled 2021-05-06: qty 1
  Filled 2021-05-06: qty 2
  Filled 2021-05-06 (×2): qty 1
  Filled 2021-05-06 (×6): qty 2
  Filled 2021-05-06: qty 1
  Filled 2021-05-06 (×2): qty 2

## 2021-05-06 MED ORDER — PANTOPRAZOLE SODIUM 40 MG PO TBEC
40.0000 mg | DELAYED_RELEASE_TABLET | Freq: Every day | ORAL | Status: DC
  Administered 2021-05-08 – 2021-05-14 (×7): 40 mg via ORAL
  Filled 2021-05-06 (×7): qty 1

## 2021-05-06 MED ORDER — LACTATED RINGERS IV SOLN
INTRAVENOUS | Status: DC
Start: 2021-05-06 — End: 2021-05-11
  Administered 2021-05-08: 20 mL/h via INTRAVENOUS

## 2021-05-06 MED ORDER — ARTIFICIAL TEARS OPHTHALMIC OINT
TOPICAL_OINTMENT | OPHTHALMIC | Status: DC | PRN
  Administered 2021-05-06: 1 via OPHTHALMIC

## 2021-05-06 MED ORDER — HEPARIN SODIUM (PORCINE) 1000 UNIT/ML IJ SOLN
INTRAMUSCULAR | Status: DC | PRN
  Administered 2021-05-06: 22000 [IU] via INTRAVENOUS
  Administered 2021-05-06: 3000 [IU] via INTRAVENOUS

## 2021-05-06 MED ORDER — SODIUM CHLORIDE (PF) 0.9 % IJ SOLN
OROMUCOSAL | Status: DC | PRN
  Administered 2021-05-06 (×2): 4 mL via TOPICAL

## 2021-05-06 MED ORDER — PHENYLEPHRINE 40 MCG/ML (10ML) SYRINGE FOR IV PUSH (FOR BLOOD PRESSURE SUPPORT)
PREFILLED_SYRINGE | INTRAVENOUS | Status: DC | PRN
  Administered 2021-05-06: 80 ug via INTRAVENOUS
  Administered 2021-05-06: 120 ug via INTRAVENOUS
  Administered 2021-05-06 (×2): 80 ug via INTRAVENOUS

## 2021-05-06 MED ORDER — MORPHINE SULFATE (PF) 2 MG/ML IV SOLN
1.0000 mg | INTRAVENOUS | Status: DC | PRN
  Administered 2021-05-06: 2 mg via INTRAVENOUS
  Administered 2021-05-07: 4 mg via INTRAVENOUS
  Administered 2021-05-07 (×3): 2 mg via INTRAVENOUS
  Administered 2021-05-07: 4 mg via INTRAVENOUS
  Administered 2021-05-07 (×2): 2 mg via INTRAVENOUS
  Filled 2021-05-06 (×2): qty 1
  Filled 2021-05-06: qty 2
  Filled 2021-05-06 (×4): qty 1
  Filled 2021-05-06: qty 2

## 2021-05-06 MED ORDER — ALBUMIN HUMAN 5 % IV SOLN: INTRAVENOUS | Status: DC | PRN

## 2021-05-06 MED ORDER — MILRINONE LACTATE IN DEXTROSE 20-5 MG/100ML-% IV SOLN
0.2500 ug/kg/min | INTRAVENOUS | Status: DC
  Administered 2021-05-07 – 2021-05-09 (×4): 0.25 ug/kg/min via INTRAVENOUS
  Filled 2021-05-06 (×4): qty 100

## 2021-05-06 MED ORDER — CHLORHEXIDINE GLUCONATE 0.12 % MT SOLN
15.0000 mL | OROMUCOSAL | Status: AC
  Administered 2021-05-06: 15 mL via OROMUCOSAL

## 2021-05-06 MED ORDER — POTASSIUM CHLORIDE 10 MEQ/50ML IV SOLN
10.0000 meq | Freq: Once | INTRAVENOUS | Status: AC
  Administered 2021-05-06: 10 meq via INTRAVENOUS

## 2021-05-06 MED ORDER — SODIUM CHLORIDE 0.45 % IV SOLN: INTRAVENOUS | Status: DC | PRN

## 2021-05-06 MED ORDER — MIDAZOLAM HCL 2 MG/2ML IJ SOLN
2.0000 mg | INTRAMUSCULAR | Status: DC | PRN
  Administered 2021-05-06 – 2021-05-07 (×3): 2 mg via INTRAVENOUS
  Filled 2021-05-06 (×3): qty 2

## 2021-05-06 MED ORDER — CHLORHEXIDINE GLUCONATE CLOTH 2 % EX PADS: 6.0000 | MEDICATED_PAD | Freq: Every day | CUTANEOUS | Status: DC

## 2021-05-06 MED ORDER — LACTATED RINGERS IV SOLN
500.0000 mL | Freq: Once | INTRAVENOUS | Status: AC | PRN
  Administered 2021-05-07: 500 mL via INTRAVENOUS

## 2021-05-06 MED ORDER — POTASSIUM CHLORIDE 10 MEQ/50ML IV SOLN
10.0000 meq | INTRAVENOUS | Status: AC
  Administered 2021-05-06 (×3): 10 meq via INTRAVENOUS

## 2021-05-06 MED ORDER — BISACODYL 5 MG PO TBEC
10.0000 mg | DELAYED_RELEASE_TABLET | Freq: Every day | ORAL | Status: DC
Start: 2021-05-07 — End: 2021-05-11
  Administered 2021-05-08 – 2021-05-11 (×4): 10 mg via ORAL
  Filled 2021-05-06 (×4): qty 2

## 2021-05-06 MED ORDER — SODIUM CHLORIDE 0.9% FLUSH
3.0000 mL | Freq: Two times a day (BID) | INTRAVENOUS | Status: DC
  Administered 2021-05-07 – 2021-06-16 (×45): 3 mL via INTRAVENOUS

## 2021-05-06 MED ORDER — MAGNESIUM SULFATE 4 GM/100ML IV SOLN
4.0000 g | Freq: Once | INTRAVENOUS | Status: AC
  Administered 2021-05-06: 4 g via INTRAVENOUS

## 2021-05-06 MED ORDER — METOPROLOL TARTRATE 25 MG/10 ML ORAL SUSPENSION: 12.5000 mg | Freq: Two times a day (BID) | ORAL | Status: DC

## 2021-05-06 MED ORDER — EPINEPHRINE HCL 5 MG/250ML IV SOLN IN NS: 0.0000 ug/min | INTRAVENOUS | Status: AC

## 2021-05-06 MED ORDER — SODIUM CHLORIDE 0.9 % IV SOLN
INTRAVENOUS | Status: DC | PRN
Start: 2021-05-06 — End: 2021-05-06

## 2021-05-06 MED ORDER — SODIUM CHLORIDE 0.9 % IV SOLN: 250.0000 mL | INTRAVENOUS | Status: DC

## 2021-05-06 MED ORDER — ORAL CARE MOUTH RINSE
15.0000 mL | OROMUCOSAL | Status: DC
  Administered 2021-05-06 – 2021-05-07 (×6): 15 mL via OROMUCOSAL

## 2021-05-06 MED ORDER — 0.9 % SODIUM CHLORIDE (POUR BTL) OPTIME
TOPICAL | Status: DC | PRN
  Administered 2021-05-06: 1000 mL
  Administered 2021-05-06: 50000 mL

## 2021-05-06 MED ORDER — PROTAMINE SULFATE 10 MG/ML IV SOLN
INTRAVENOUS | Status: DC | PRN
  Administered 2021-05-06: 25 mg via INTRAVENOUS
  Administered 2021-05-06: 240 mg via INTRAVENOUS
  Administered 2021-05-06: 10 mg via INTRAVENOUS

## 2021-05-06 MED ORDER — LACTATED RINGERS IV SOLN: INTRAVENOUS | Status: DC | PRN

## 2021-05-06 MED ORDER — DEXMEDETOMIDINE HCL IN NACL 400 MCG/100ML IV SOLN
0.0000 ug/kg/h | INTRAVENOUS | Status: DC
  Administered 2021-05-07: 0.7 ug/kg/h via INTRAVENOUS
  Filled 2021-05-06 (×2): qty 100

## 2021-05-06 MED ORDER — ARTIFICIAL TEARS OPHTHALMIC OINT
TOPICAL_OINTMENT | OPHTHALMIC | Status: AC
  Filled 2021-05-06: qty 3.5

## 2021-05-06 MED ORDER — NITROGLYCERIN IN D5W 200-5 MCG/ML-% IV SOLN: 0.0000 ug/min | INTRAVENOUS | Status: DC

## 2021-05-06 MED ORDER — SODIUM CHLORIDE 0.9% FLUSH
3.0000 mL | INTRAVENOUS | Status: DC | PRN
  Administered 2021-05-10: 3 mL via INTRAVENOUS

## 2021-05-06 MED ORDER — DOCUSATE SODIUM 100 MG PO CAPS
200.0000 mg | ORAL_CAPSULE | Freq: Every day | ORAL | Status: DC
  Administered 2021-05-08 – 2021-05-11 (×4): 200 mg via ORAL
  Filled 2021-05-06 (×4): qty 2

## 2021-05-06 MED ORDER — PROPOFOL 10 MG/ML IV BOLUS
INTRAVENOUS | Status: DC | PRN
  Administered 2021-05-06: 20 mg via INTRAVENOUS

## 2021-05-06 MED ORDER — CHLORHEXIDINE GLUCONATE CLOTH 2 % EX PADS
6.0000 | MEDICATED_PAD | Freq: Every day | CUTANEOUS | Status: DC
  Administered 2021-05-06 – 2021-05-09 (×4): 6 via TOPICAL

## 2021-05-06 MED ORDER — ASPIRIN 81 MG PO CHEW
324.0000 mg | CHEWABLE_TABLET | Freq: Every day | ORAL | Status: DC
  Administered 2021-05-07: 324 mg
  Filled 2021-05-06: qty 4

## 2021-05-06 MED ORDER — PHENYLEPHRINE HCL-NACL 20-0.9 MG/250ML-% IV SOLN: 0.0000 ug/min | INTRAVENOUS | Status: DC

## 2021-05-06 MED ORDER — METOPROLOL TARTRATE 5 MG/5ML IV SOLN
2.5000 mg | INTRAVENOUS | Status: DC | PRN
  Administered 2021-05-07: 2.5 mg via INTRAVENOUS
  Administered 2021-05-15: 4 mg via INTRAVENOUS
  Filled 2021-05-06 (×2): qty 5

## 2021-05-06 MED ORDER — ASPIRIN EC 325 MG PO TBEC
325.0000 mg | DELAYED_RELEASE_TABLET | Freq: Every day | ORAL | Status: DC
  Administered 2021-05-08 – 2021-05-15 (×8): 325 mg via ORAL
  Filled 2021-05-06 (×8): qty 1

## 2021-05-06 MED ORDER — FENTANYL CITRATE (PF) 250 MCG/5ML IJ SOLN
INTRAMUSCULAR | Status: DC | PRN
  Administered 2021-05-06: 25 ug via INTRAVENOUS
  Administered 2021-05-06: 50 ug via INTRAVENOUS
  Administered 2021-05-06: 100 ug via INTRAVENOUS
  Administered 2021-05-06: 25 ug via INTRAVENOUS
  Administered 2021-05-06: 50 ug via INTRAVENOUS
  Administered 2021-05-06: 700 ug via INTRAVENOUS
  Administered 2021-05-06: 50 ug via INTRAVENOUS

## 2021-05-06 MED ORDER — ACETAMINOPHEN 160 MG/5ML PO SOLN: 650.0000 mg | Freq: Once | ORAL | Status: AC

## 2021-05-06 MED ORDER — TRAMADOL HCL 50 MG PO TABS
50.0000 mg | ORAL_TABLET | ORAL | Status: DC | PRN
  Administered 2021-05-07 – 2021-05-15 (×11): 100 mg via ORAL
  Administered 2021-05-17: 50 mg via ORAL
  Filled 2021-05-06 (×5): qty 2
  Filled 2021-05-06: qty 1
  Filled 2021-05-06 (×6): qty 2

## 2021-05-06 MED ORDER — SODIUM CHLORIDE 0.9 % IV SOLN: INTRAVENOUS | Status: DC

## 2021-05-06 MED ORDER — SODIUM CHLORIDE (PF) 0.9 % IJ SOLN
INTRAMUSCULAR | Status: AC
  Filled 2021-05-06: qty 10

## 2021-05-06 MED ORDER — MIDAZOLAM HCL (PF) 10 MG/2ML IJ SOLN
INTRAMUSCULAR | Status: AC
  Filled 2021-05-06: qty 2

## 2021-05-06 MED ORDER — SODIUM CHLORIDE 0.9% FLUSH
10.0000 mL | Freq: Two times a day (BID) | INTRAVENOUS | Status: DC
  Administered 2021-05-06: 20 mL
  Administered 2021-05-07 – 2021-05-10 (×5): 10 mL
  Administered 2021-05-10: 20 mL
  Administered 2021-05-10 – 2021-05-14 (×5): 10 mL
  Administered 2021-05-14: 20 mL

## 2021-05-06 MED ORDER — PROPOFOL 1000 MG/100ML IV EMUL
5.0000 ug/kg/min | INTRAVENOUS | Status: DC
  Filled 2021-05-06: qty 100

## 2021-05-06 MED ORDER — HEMOSTATIC AGENTS (NO CHARGE) OPTIME
TOPICAL | Status: DC | PRN
  Administered 2021-05-06 (×2): 1 via TOPICAL

## 2021-05-06 MED ORDER — HEPARIN SODIUM (PORCINE) 1000 UNIT/ML IJ SOLN
INTRAMUSCULAR | Status: AC
  Filled 2021-05-06: qty 1

## 2021-05-06 MED ORDER — ACETAMINOPHEN 500 MG PO TABS
1000.0000 mg | ORAL_TABLET | Freq: Four times a day (QID) | ORAL | Status: AC
  Administered 2021-05-07 – 2021-05-11 (×11): 1000 mg via ORAL
  Filled 2021-05-06 (×12): qty 2

## 2021-05-06 MED ORDER — CHLORHEXIDINE GLUCONATE 0.12% ORAL RINSE (MEDLINE KIT)
15.0000 mL | Freq: Two times a day (BID) | OROMUCOSAL | Status: DC
  Administered 2021-05-06 – 2021-05-07 (×2): 15 mL via OROMUCOSAL

## 2021-05-06 MED ORDER — LACTATED RINGERS IV SOLN: INTRAVENOUS | Status: DC

## 2021-05-06 MED ORDER — LIDOCAINE 2% (20 MG/ML) 5 ML SYRINGE
INTRAMUSCULAR | Status: AC
  Filled 2021-05-06: qty 5

## 2021-05-06 MED ORDER — ONDANSETRON HCL 4 MG/2ML IJ SOLN
4.0000 mg | Freq: Four times a day (QID) | INTRAMUSCULAR | Status: DC | PRN
  Administered 2021-05-07 – 2021-05-31 (×12): 4 mg via INTRAVENOUS
  Filled 2021-05-06 (×13): qty 2

## 2021-05-06 MED ORDER — SURGIFLO WITH THROMBIN (HEMOSTATIC MATRIX KIT) OPTIME
TOPICAL | Status: DC | PRN
Start: 2021-05-06 — End: 2021-05-06
  Administered 2021-05-06 (×2): 1 via TOPICAL

## 2021-05-06 MED ORDER — CHLORHEXIDINE GLUCONATE CLOTH 2 % EX PADS
6.0000 | MEDICATED_PAD | Freq: Every day | CUTANEOUS | Status: DC
  Administered 2021-05-07 – 2021-05-11 (×3): 6 via TOPICAL

## 2021-05-06 MED FILL — Midazolam HCl Inj 2 MG/2ML (Base Equivalent): INTRAMUSCULAR | Qty: 2 | Status: AC

## 2021-05-06 MED FILL — Fentanyl Citrate Preservative Free (PF) Inj 100 MCG/2ML: INTRAMUSCULAR | Qty: 2 | Status: AC

## 2021-05-06 SURGICAL SUPPLY — 107 items
ADAPTER CARDIO PERF ANTE/RETRO (ADAPTER) ×5 IMPLANT
BAG DECANTER FOR FLEXI CONT (MISCELLANEOUS) ×5 IMPLANT
BIOPATCH BLUE 3/4IN DISK W/1.5 (GAUZE/BANDAGES/DRESSINGS) ×1 IMPLANT
BIOPATCH RED 1 DISK 7.0 (GAUZE/BANDAGES/DRESSINGS) ×1 IMPLANT
BLADE CLIPPER SURG (BLADE) ×6 IMPLANT
BLADE STERNUM SYSTEM 6 (BLADE) ×5 IMPLANT
BLADE SURG 12 STRL SS (BLADE) ×5 IMPLANT
BNDG ELASTIC 4X5.8 VLCR STR LF (GAUZE/BANDAGES/DRESSINGS) ×5 IMPLANT
BNDG ELASTIC 6X5.8 VLCR STR LF (GAUZE/BANDAGES/DRESSINGS) ×5 IMPLANT
BNDG GAUZE ELAST 4 BULKY (GAUZE/BANDAGES/DRESSINGS) ×5 IMPLANT
CANISTER SUCT 3000ML PPV (MISCELLANEOUS) ×5 IMPLANT
CANNULA GUNDRY RCSP 15FR (MISCELLANEOUS) ×5 IMPLANT
CATH CPB KIT VANTRIGT (MISCELLANEOUS) ×5 IMPLANT
CATH ROBINSON RED A/P 18FR (CATHETERS) ×15 IMPLANT
CATH THORACIC 28FR (CATHETERS) ×1 IMPLANT
CATH THORACIC 28FR RT ANG (CATHETERS) ×6 IMPLANT
CLIP TI WIDE RED SMALL 24 (CLIP) ×1 IMPLANT
CONN Y 3/8X3/8X3/8  BEN (MISCELLANEOUS) ×5
CONN Y 3/8X3/8X3/8 BEN (MISCELLANEOUS) IMPLANT
DERMABOND ADVANCED (GAUZE/BANDAGES/DRESSINGS) ×1
DERMABOND ADVANCED .7 DNX12 (GAUZE/BANDAGES/DRESSINGS) IMPLANT
DRAIN CHANNEL 32F RND 10.7 FF (WOUND CARE) ×5 IMPLANT
DRAPE CARDIOVASCULAR INCISE (DRAPES) ×5
DRAPE SLUSH/WARMER DISC (DRAPES) ×5 IMPLANT
DRAPE SRG 135X102X78XABS (DRAPES) ×4 IMPLANT
DRESSING MEPILEX FLEX 4X4 (GAUZE/BANDAGES/DRESSINGS) IMPLANT
DRSG AQUACEL AG ADV 3.5X14 (GAUZE/BANDAGES/DRESSINGS) ×5 IMPLANT
DRSG MEPILEX FLEX 4X4 (GAUZE/BANDAGES/DRESSINGS) ×5
DRSG TEGADERM 4X4.75 (GAUZE/BANDAGES/DRESSINGS) ×1 IMPLANT
ELECT BLADE 4.0 EZ CLEAN MEGAD (MISCELLANEOUS) ×5
ELECT BLADE 6.5 EXT (BLADE) ×5 IMPLANT
ELECT CAUTERY BLADE 6.4 (BLADE) ×5 IMPLANT
ELECT REM PT RETURN 9FT ADLT (ELECTROSURGICAL) ×10
ELECTRODE BLDE 4.0 EZ CLN MEGD (MISCELLANEOUS) ×4 IMPLANT
ELECTRODE REM PT RTRN 9FT ADLT (ELECTROSURGICAL) ×8 IMPLANT
FELT TEFLON 1X6 (MISCELLANEOUS) ×9 IMPLANT
GAUZE 4X4 16PLY ~~LOC~~+RFID DBL (SPONGE) ×5 IMPLANT
GAUZE SPONGE 4X4 12PLY STRL (GAUZE/BANDAGES/DRESSINGS) ×10 IMPLANT
GAUZE SPONGE 4X4 12PLY STRL LF (GAUZE/BANDAGES/DRESSINGS) ×2 IMPLANT
GLOVE SURG ENC MOIS LTX SZ7.5 (GLOVE) ×15 IMPLANT
GLOVE SURG MICRO LTX SZ6 (GLOVE) ×10 IMPLANT
GOWN STRL REUS W/ TWL LRG LVL3 (GOWN DISPOSABLE) ×16 IMPLANT
GOWN STRL REUS W/TWL LRG LVL3 (GOWN DISPOSABLE) ×50
HEMOSTAT POWDER SURGIFOAM 1G (HEMOSTASIS) ×16 IMPLANT
HEMOSTAT SURGICEL 2X14 (HEMOSTASIS) ×5 IMPLANT
INSERT FOGARTY XLG (MISCELLANEOUS) IMPLANT
IV ADAPTER SYR DOUBLE MALE LL (MISCELLANEOUS) ×1 IMPLANT
IV CATH 18G X1.75 CATHLON (IV SOLUTION) ×1 IMPLANT
KIT BASIN OR (CUSTOM PROCEDURE TRAY) ×5 IMPLANT
KIT SUCTION CATH 14FR (SUCTIONS) ×5 IMPLANT
KIT TURNOVER KIT B (KITS) ×5 IMPLANT
KIT VASOVIEW HEMOPRO 2 VH 4000 (KITS) ×5 IMPLANT
LEAD PACING MYOCARDI (MISCELLANEOUS) ×5 IMPLANT
MARKER GRAFT CORONARY BYPASS (MISCELLANEOUS) ×15 IMPLANT
NS IRRIG 1000ML POUR BTL (IV SOLUTION) ×25 IMPLANT
PACK E OPEN HEART (SUTURE) ×5 IMPLANT
PACK OPEN HEART (CUSTOM PROCEDURE TRAY) ×5 IMPLANT
PAD ARMBOARD 7.5X6 YLW CONV (MISCELLANEOUS) ×10 IMPLANT
PAD ELECT DEFIB RADIOL ZOLL (MISCELLANEOUS) ×5 IMPLANT
PENCIL BUTTON HOLSTER BLD 10FT (ELECTRODE) ×5 IMPLANT
POSITIONER HEAD DONUT 9IN (MISCELLANEOUS) ×5 IMPLANT
PUNCH AORTIC ROTATE 4.0MM (MISCELLANEOUS) IMPLANT
PUNCH AORTIC ROTATE 4.5MM 8IN (MISCELLANEOUS) ×1 IMPLANT
PUNCH AORTIC ROTATE 5MM 8IN (MISCELLANEOUS) IMPLANT
SET MPS 3-ND DEL (MISCELLANEOUS) ×1 IMPLANT
SPONGE T-LAP 18X18 ~~LOC~~+RFID (SPONGE) ×23 IMPLANT
SPONGE T-LAP 4X18 ~~LOC~~+RFID (SPONGE) ×5 IMPLANT
STOPCOCK 3 WAY HIGH PRESSURE (MISCELLANEOUS) ×5
STOPCOCK 3WAY HIGH PRESSURE (MISCELLANEOUS) IMPLANT
SUPPORT HEART JANKE-BARRON (MISCELLANEOUS) ×5 IMPLANT
SURGIFLO W/THROMBIN 8M KIT (HEMOSTASIS) ×6 IMPLANT
SUT BONE WAX W31G (SUTURE) ×5 IMPLANT
SUT MNCRL AB 4-0 PS2 18 (SUTURE) IMPLANT
SUT PROLENE 3 0 SH DA (SUTURE) IMPLANT
SUT PROLENE 3 0 SH1 36 (SUTURE) IMPLANT
SUT PROLENE 4 0 RB 1 (SUTURE) ×15
SUT PROLENE 4 0 SH DA (SUTURE) ×7 IMPLANT
SUT PROLENE 4-0 RB1 .5 CRCL 36 (SUTURE) ×4 IMPLANT
SUT PROLENE 5 0 C 1 36 (SUTURE) IMPLANT
SUT PROLENE 6 0 C 1 30 (SUTURE) ×4 IMPLANT
SUT PROLENE 6 0 CC (SUTURE) ×15 IMPLANT
SUT PROLENE 8 0 BV175 6 (SUTURE) ×3 IMPLANT
SUT PROLENE BLUE 7 0 (SUTURE) ×6 IMPLANT
SUT SILK  1 MH (SUTURE) ×5
SUT SILK 1 MH (SUTURE) IMPLANT
SUT SILK 2 0 SH CR/8 (SUTURE) ×1 IMPLANT
SUT SILK 3 0 SH CR/8 (SUTURE) IMPLANT
SUT STEEL 6MS V (SUTURE) ×8 IMPLANT
SUT STEEL STERNAL CCS#1 18IN (SUTURE) ×1 IMPLANT
SUT STEEL SZ 6 DBL 3X14 BALL (SUTURE) ×6 IMPLANT
SUT VIC AB 1 CTX 36 (SUTURE) ×10
SUT VIC AB 1 CTX36XBRD ANBCTR (SUTURE) ×8 IMPLANT
SUT VIC AB 2-0 CT1 27 (SUTURE) ×5
SUT VIC AB 2-0 CT1 TAPERPNT 27 (SUTURE) IMPLANT
SUT VIC AB 2-0 CTX 27 (SUTURE) IMPLANT
SUT VIC AB 3-0 X1 27 (SUTURE) ×1 IMPLANT
SYSTEM SAHARA CHEST DRAIN ATS (WOUND CARE) ×5 IMPLANT
TAPE CLOTH SURG 4X10 WHT LF (GAUZE/BANDAGES/DRESSINGS) ×1 IMPLANT
TAPE PAPER 2X10 WHT MICROPORE (GAUZE/BANDAGES/DRESSINGS) ×1 IMPLANT
TOWEL GREEN STERILE (TOWEL DISPOSABLE) ×5 IMPLANT
TOWEL GREEN STERILE FF (TOWEL DISPOSABLE) ×5 IMPLANT
TRAY CATH LUMEN 1 20CM STRL (SET/KITS/TRAYS/PACK) ×1 IMPLANT
TRAY FOLEY SLVR 16FR TEMP STAT (SET/KITS/TRAYS/PACK) ×5 IMPLANT
TUBING ART PRESS 48 MALE/FEM (TUBING) ×4 IMPLANT
TUBING LAP HI FLOW INSUFFLATIO (TUBING) ×5 IMPLANT
UNDERPAD 30X36 HEAVY ABSORB (UNDERPADS AND DIAPERS) ×5 IMPLANT
WATER STERILE IRR 1000ML POUR (IV SOLUTION) ×10 IMPLANT

## 2021-05-06 NOTE — Progress Notes (Addendum)
1 bag cryo given [id number] H verified with Julaine Fusi RN Start time 1745 End Time [phone]

## 2021-05-06 NOTE — Progress Notes (Signed)
Patient ID: Randall Hodges, male   DOB: [birth year], 71 y.o.   MRN: [id number]  TCTS Evening Rounds:   Hemodynamically stable  CI = 2.7 on milrinone 0.25, epi 2, NE 10 Atrial paced 100  Asleep on vent. Plan to leave intubated overnight per PVT.  Urine output good  CT output low  CBC    Component Value Date/Time   WBC 12.9 (H) 05/01/2021 1715   RBC 2.57 (L) 05/14/2021 1715   HGB 8.1 (L) 05/11/2021 1715   HGB 8.8 (L) 05/09/2021 1715   HCT 23.8 (L) 05/14/2021 1715   HCT 26.0 (L) 05/17/2021 1715   PLT 57 (L) 04/30/2021 1715   MCV 92.6 04/22/2021 1715   MCH 31.5 04/25/2021 1715   MCHC 34.0 04/19/2021 1715   RDW 13.0 04/21/2021 1715   LYMPHSABS 2.5 08/01/2017 1627   MONOABS 0.7 08/01/2017 1627   EOSABS 0.4 08/01/2017 1627   BASOSABS 0.0 08/01/2017 1627     BMET    Component Value Date/Time   NA 139 04/21/2021 1715   NA 142 08/08/2020 1546   K 3.8 05/19/2021 1715   CL 103 05/07/2021 1548   CO2 25 05/17/2021 0459   GLUCOSE 129 (H) 05/09/2021 1548   BUN 10 05/02/2021 1548   BUN 17 08/08/2020 1546   CREATININE 0.60 (L) 05/12/2021 1548   CALCIUM 8.2 (L) 04/22/2021 0459   EGFR 64 08/08/2020 1546   GFRNONAA >60 05/17/2021 0459     A/P:  Stable postop course. Continue current plans, keep intubated overnight.

## 2021-05-06 NOTE — Anesthesia Postprocedure Evaluation (Signed)
Anesthesia Post Note  Patient: Randall Hodges  Procedure(s) Performed: CORONARY ARTERY BYPASS GRAFTING (CABG) x3 on pump using left internal mammary artery and right endoscopic greater saphenous vein conduit. (Chest) TRANSESOPHAGEAL ECHOCARDIOGRAM (TEE) APPLICATION OF CELL SAVER ENDOVEIN HARVEST OF GREATER SAPHENOUS VEIN (Right)     Patient location during evaluation: SICU Anesthesia Type: General Level of consciousness: sedated and patient remains intubated per anesthesia plan Pain management: pain level controlled Vital Signs Assessment: post-procedure vital signs reviewed and stable Respiratory status: patient remains intubated per anesthesia plan and patient on ventilator - see flowsheet for VS (pt will remain intubated overnight) Cardiovascular status: stable (remains on Milrinone and Epi support) Postop Assessment: no apparent nausea or vomiting Anesthetic complications: no   No notable events documented.  Last Vitals:  Vitals:   04/26/2021 1800 05/10/2021 1813  BP: 105/71   Pulse: 99 96  Resp: 17 13  Temp: (!) 35.3 C (!) 35.3 C  SpO2: 100% 100%    Last Pain:  Vitals:   05/16/2021 0614  TempSrc:   PainSc: 3                  Randall Hodges,E. Herman Fiero

## 2021-05-06 NOTE — Transfer of Care (Signed)
Immediate Anesthesia Transfer of Care Note  Patient: Randall Hodges  Procedure(s) Performed: CORONARY ARTERY BYPASS GRAFTING (CABG) x3 on pump using left internal mammary artery and right endoscopic greater saphenous vein conduit. (Chest) TRANSESOPHAGEAL ECHOCARDIOGRAM (TEE) APPLICATION OF CELL SAVER ENDOVEIN HARVEST OF GREATER SAPHENOUS VEIN (Right)  Patient Location: ICU  Anesthesia Type:General  Level of Consciousness: Patient remains intubated per anesthesia plan  Airway & Oxygen Therapy: Patient remains intubated per anesthesia plan and Patient placed on Ventilator (see vital sign flow sheet for setting)  Post-op Assessment: Report given to RN and Post -op Vital signs reviewed and stable  Post vital signs: Reviewed, Dr. Darcey Nora at bedside  Last Vitals:  Vitals Value Taken Time  BP 86/55 05/02/2021 1700  Temp 35.8 C 05/14/2021 1710  Pulse 99 04/19/2021 1710  Resp 17 04/20/2021 1710  SpO2 100 % 05/19/2021 1710  Vitals shown include unvalidated device data.  Last Pain:  Vitals:   04/26/2021 0614  TempSrc:   PainSc: 3       Patients Stated Pain Goal: 3 (70/48/88 9169)  Complications: No notable events documented.

## 2021-05-06 NOTE — Anesthesia Procedure Notes (Signed)
Procedure Name: Intubation Date/Time: 04/26/2021 9:03 AM Performed by: Griffin Dakin, CRNA Pre-anesthesia Checklist: Patient identified, Emergency Drugs available, Suction available and Patient being monitored Patient Re-evaluated:Patient Re-evaluated prior to induction Oxygen Delivery Method: Circle system utilized Preoxygenation: Pre-oxygenation with 100% oxygen Induction Type: IV induction Ventilation: Mask ventilation without difficulty and Oral airway inserted - appropriate to patient size Laryngoscope Size: Mac and 4 Grade View: Grade I Tube type: Oral Tube size: 8.0 mm Number of attempts: 1 Airway Equipment and Method: Stylet and Oral airway Placement Confirmation: ETT inserted through vocal cords under direct vision, positive ETCO2 and breath sounds checked- equal and bilateral Secured at: 24 cm Tube secured with: Tape Dental Injury: Teeth and Oropharynx as per pre-operative assessment

## 2021-05-06 NOTE — Progress Notes (Signed)
Pre Procedure note for inpatients:   Randall Hodges has been scheduled for Procedure(s): CORONARY ARTERY BYPASS GRAFTING (CABG) (N/A) TRANSESOPHAGEAL ECHOCARDIOGRAM (TEE) (N/A) today. The various methods of treatment have been discussed with the patient. After consideration of the risks, benefits and treatment options the patient has consented to the planned procedure.   The patient has been seen and labs reviewed. There are no changes in the patients condition to prevent proceeding with the planned procedure today.  Recent labs:  Lab Results  Component Value Date   WBC 8.6 05/11/2021   HGB 11.1 (L) 05/16/2021   HCT 34.2 (L) 04/20/2021   PLT 127 (L) 04/30/2021   GLUCOSE 121 (H) 05/14/2021   ALT 19 08/08/2020   AST 23 08/08/2020   NA 134 (L) 04/30/2021   K 4.4 04/29/2021   CL 104 05/17/2021   CREATININE 1.08 05/07/2021   BUN 13 04/21/2021   CO2 25 04/27/2021   INR 1.1 04/25/2021    Dahlia Byes, MD 05/14/2021 8:26 AM

## 2021-05-06 NOTE — Anesthesia Procedure Notes (Signed)
Central Venous Catheter Insertion Performed by: Annye Asa, MD, anesthesiologist Start/End01/29/2023 7:37 AM, 05/17/2021 7:48 AM Patient location: Pre-op. Preanesthetic checklist: patient identified, IV checked, risks and benefits discussed, surgical consent, monitors and equipment checked, pre-op evaluation, timeout performed and anesthesia consent Position: supine Lidocaine 1% used for infiltration and patient sedated Hand hygiene performed , maximum sterile barriers used  and Seldinger technique used Catheter size: 8.5 Fr PA cath was placed.Sheath introducer Swan type:thermodilution Procedure performed using ultrasound guided technique. Ultrasound Notes:anatomy identified, needle tip was noted to be adjacent to the nerve/plexus identified, no ultrasound evidence of intravascular and/or intraneural injection and image(s) printed for medical record Attempts: 1 Following insertion, line sutured, dressing applied and Biopatch. Post procedure assessment: free fluid flow, blood return through all ports and no air  Patient tolerated the procedure well with no immediate complications. Additional procedure comments: PA catheter:  Routine monitors. Timeout, sterile prep, drape, FBP R neck. Supine position.  1% Lido local, finder and trocar RIJ 1st pass with US guidance.  Cordis placed over J wire. PA catheter in easily.  Sterile dressing applied.  Patient tolerated well, VSS.  Jenita Seashore, MD .

## 2021-05-06 NOTE — Progress Notes (Signed)
°  Echocardiogram Echocardiogram Transesophageal has been performed.  Randall Hodges 04/23/2021, 9:44 AM

## 2021-05-06 NOTE — Hospital Course (Addendum)
Primary Care: Randall Bunting, MD Primary Cardiologist:Randall Stann Mainland, MD   History of Present Illness:       Patient examined, images of coronary arteriogram and echocardiogram personally reviewed and counseled with patient. [name] was hospitalized via the ER today for STEMI when he developed chest pain in the early a.m. while in bed which persisted.  In the ED his troponin was not elevated but he did had some EKG changes.  Cardiac catheterization by Dr. Daneen Hodges demonstrates significant left main stenosis with some disease in a small nondominant RCA.  LV function is normal and LVEDP is normal.  The patient is feeling improved on IV nitroglycerin and heparin protocol was ordered.  He is in a sinus rhythm and is breathing comfortably.  Chest x-ray shows no evidence of edema.  Carotid duplex ultrasound shows no significant stenosis.  There is a positive family history of coronary artery disease.  Patient and all relevant studies were reviewed by Dr. Darcey Hodges recommended proceeding with coronary artery bypass grafting.  Hospital course:  The patient was medically stabilized and felt to be ready to proceed with surgery on 05/16/2021.  He was taken to the operating room at which time he underwent coronary artery bypass grafting x3.  He tolerated the procedure well was taken to the surgical intensive care unit in stable condition.  Postoperative hospital course:   On postoperative day 1 he remained on the vent.  He was able to be extubated during the day.  He initially has required multiple inotrope and pressor agents but these have been able to be weaned off over time.  He has been on low dose epinephrine, Levophed and milrinone.  He has been titrated off over the first few postoperative days.  He does have an expected acute blood loss anemia.  He has required blood products and blood transfusion.  He has a thrombocytopenia which is being monitored closely.   Levels are improving over time.  Renal function has remained within normal limits.  He did develop postoperative atrial fibrillation and was started on amiodarone wit resolution of the atrial fibrillation.  He also had some episode of nonsustained ventricular tachycardia and was placed on a lidocaine drip on postop day #2.  Amiodarone was transitioned from intravenous to oral route.  He had some postoperative confusion which  improved over time.  Pain control was somewhat difficult but also improved with time. By post-op day 5, he was ready for transfer to 4E Progressive Care. Activity was advanced and well tolerated. He had hiccups off and on during the week after surgery.  This persisted despite treatment with Protonix, Reglan, and Baclofen. The Baclofen was discontinued due to hallucinations and replaced with thorazine IM TID which was also not helpful. On post-op day 8, he was started on gabapentin 300mg  TID which seemed to quiet the hiccups. The GI service was consulted and recommended holding off on GI work up unless the gabapentin treatment proved unsuccessful. After being in [facility] for several days, he had recurrent atrial fibrillation on 05/14/21.  He was re-bolused with IV amiodarone and was started on Eliquis. He converted back to SR. The patient's CXR showed evidence of pulmonary edema and opacification.  Advanced heart failure team was consulted for assistance.  The patient was aggressively diuresed with IV Lasix and Metolazone.  He responded well and diuresed several liters of fluid.  He unfortunately developed an AKI with creatinine bumping to 1.79.  He also developed hypotension with SBP in  the 60s.  He was treated with a 500 NS bolus and his entreso and coreg were discontinued.  He again developed into atrial fibrillation and was restarted on the Amiodarone drip.  EKG was suggestive of restrictive pericarditis and colchicine was initiated.  In addition to Gabapentin, he was started on Valium for  additional relief of hiccoughs.  This provided relief, however patient was very somnolent and the doses were adjusted. On pos-op day 11 he developed hypotension, worsening renal function,  and was noted to have worsening infiltrates on his CXR consistent with aspiration pneumonia. He did not have adequate IV access for fluid resuscitation so he was transferred to the ICU where a PICC was placed.  He was placed on IV Maxipime.  The oral anticoagulation was discontinued until an enlarging pericardial effusion could be ruled out. His hypotension recurred on the following day. He was given additional IV albumin and  saline bolus with minimal effect. A levophed infusion was initiated with appropriate response in BP.  An arterial line was also placed for more accurate monitoring.  An echocardiogram on that date had findings c/w  constrictive pericarditis.  He was returned to the cath lab on 1/30 by cardiology for a right heart cath to further evaluate hemodynamics.  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  Over the next 24 hours, his respiratory status declined further and radiographic findings on chest x-ray were consistent with aspiration pneumonia.  He developed worsening hypoxia and hypercarbia.  On 06/13/2021, bronchoscopy was carried out by Dr. Tacy Hodges of critical care medicine team.  Cultures were obtained.  The patient was intubated and placed on mechanical ventilation.  Despite this, he continued to have severe he was proned briefly without any improvement.  After this, the decision was made to proceed with for VV ECMO.  This was accomplished in the Cath Lab in the afternoon of 06/05/2021.  This resulted in normalization of his blood gas.  Antibiotics were continued with broadening of coverage with meropenem and vancomycin.  He was supported nutritionally with tube feedings by way of a CorTrac postpyloric feeding tube. He  was in SR on 02/04 on Amiodarone drip. He was on Bivalirudin drip for ECMO circutit and had no bleeding. He remained intubated;PEEP was decreased. He underwent tracheostomy on 05/26/2021 since he had failed to make any progress in weaning from mechanical ventilatory support.  He remains on full ventilatory and ECMO support for several days.  On 06/01/2021, he had CT scans of the chest, abdomen, and pelvis.  This imaging demonstrated diffuse groundglass opacities in the lungs consistent with atelectasis and edema.  He is also noted to have underlying moderate interstitial lung disease that had progressed compared to prior CT scans.  ECMO support continued.  On 06/03/2021, the oxygenator was changed along with the tracheostomy tube.  The amiodarone was discontinued since he had remained in a sinus rhythm for several days since it was initiated.  He developed MRSE bacteremia and this agent was also cultured from a tracheal aspirate.  He was treated with IV vancomycin.  He had recurrence of the hiccups which had been prolonged and intractable early in the postoperative course.  He was placed back on baclofen and gabapentin for treatment of the hiccups with improvement.  Renal function normalized and remained stable.  On 07/03/21 he had an episode of atrial flutter with RVR requiring emergent DC cardioversion. Amiodarone was resumed. He converted bck to  SR.  ECMO and vent support continued along with nutritional support by way of the CorTrak feeding tube. He continued to participate in physical / occupational therapies on a limited basis due to weakness and lethargy.

## 2021-05-06 NOTE — Brief Op Note (Signed)
05/09/2021 - 04/27/2021  8:53 AM  PATIENT:  Randall Hodges  71 y.o. male  PRE-OPERATIVE DIAGNOSIS:  CAD  POST-OPERATIVE DIAGNOSIS:  CAD  PROCEDURE:  Procedure(s): CORONARY ARTERY BYPASS GRAFTING (CABG) x3 on pump using left internal mammary artery and right endoscopic greater saphenous vein conduit. (N/A) TRANSESOPHAGEAL ECHOCARDIOGRAM (TEE) (N/A) APPLICATION OF CELL SAVER ENDOVEIN HARVEST OF GREATER SAPHENOUS VEIN (Right) [place] SVG-OM SVG-PD EVH 25/15  SURGEON:  Surgeon(s) and Role:    Dahlia Byes, MD - Primary  PHYSICIAN ASSISTANT: Geneive Sandstrom PA-C  ASSISTANTS: STAFF   ANESTHESIA:   general  EBL:  1288 ml   BLOOD ADMINISTERED:none  DRAINS:  LEFT PLEURAL AND MEDIASTINAL CHEST DRAINS    LOCAL MEDICATIONS USED:  NONE  SPECIMEN:  No Specimen  DISPOSITION OF SPECIMEN:  N/A  COUNTS:  YES  TOURNIQUET:  * No tourniquets in log *  DICTATION: .Other Dictation: Dictation Number PENDING  PLAN OF CARE: Admit to inpatient   PATIENT DISPOSITION:  ICU - intubated and hemodynamically stable.   Delay start of Pharmacological VTE agent (>24hrs) due to surgical blood loss or risk of bleeding: yes  COMPLICATIONS: NO KNOWN

## 2021-05-06 NOTE — Progress Notes (Signed)
EKG CRITICAL VALUE     12 lead EKG performed.  Critical value noted.  Kyung Rudd, RN notified.   Ouida Sills, CCT 05/17/2021 5:18 PM

## 2021-05-06 NOTE — Anesthesia Procedure Notes (Signed)
Arterial Line Insertion Start/End1/18/2023 8:01 AM Performed by: Annye Asa, MD, Griffin Dakin, CRNA, CRNA  Patient location: Pre-op. Preanesthetic checklist: patient identified, risks and benefits discussed, monitors and equipment checked, pre-op evaluation and timeout performed Lidocaine 1% used for infiltration radial was placed Catheter size: 20 G Hand hygiene performed  and maximum sterile barriers used  Allen's test indicative of satisfactory collateral circulation Attempts: 1 Procedure performed without using ultrasound guided technique. Additional procedure comments: Inserted by Drucie Opitz, SRNA .

## 2021-05-07 ENCOUNTER — Inpatient Hospital Stay (HOSPITAL_COMMUNITY): Payer: PPO

## 2021-05-07 ENCOUNTER — Encounter (HOSPITAL_COMMUNITY): Payer: Self-pay | Admitting: Cardiothoracic Surgery

## 2021-05-07 LAB — GLUCOSE, CAPILLARY
Glucose-Capillary: 118 mg/dL — ABNORMAL HIGH (ref 70–99)
Glucose-Capillary: 123 mg/dL — ABNORMAL HIGH (ref 70–99)
Glucose-Capillary: 128 mg/dL — ABNORMAL HIGH (ref 70–99)
Glucose-Capillary: 128 mg/dL — ABNORMAL HIGH (ref 70–99)
Glucose-Capillary: 133 mg/dL — ABNORMAL HIGH (ref 70–99)
Glucose-Capillary: 140 mg/dL — ABNORMAL HIGH (ref 70–99)
Glucose-Capillary: 140 mg/dL — ABNORMAL HIGH (ref 70–99)
Glucose-Capillary: 142 mg/dL — ABNORMAL HIGH (ref 70–99)
Glucose-Capillary: 152 mg/dL — ABNORMAL HIGH (ref 70–99)
Glucose-Capillary: 155 mg/dL — ABNORMAL HIGH (ref 70–99)
Glucose-Capillary: 163 mg/dL — ABNORMAL HIGH (ref 70–99)
Glucose-Capillary: 163 mg/dL — ABNORMAL HIGH (ref 70–99)
Glucose-Capillary: 170 mg/dL — ABNORMAL HIGH (ref 70–99)
Glucose-Capillary: 193 mg/dL — ABNORMAL HIGH (ref 70–99)

## 2021-05-07 LAB — BASIC METABOLIC PANEL
Anion gap: 6 (ref 5–15)
Anion gap: 10 (ref 5–15)
BUN: 10 mg/dL (ref 8–23)
BUN: 10 mg/dL (ref 8–23)
CO2: 21 mmol/L — ABNORMAL LOW (ref 22–32)
CO2: 20 mmol/L — ABNORMAL LOW (ref 22–32)
Calcium: 7.6 mg/dL — ABNORMAL LOW (ref 8.9–10.3)
Calcium: 8 mg/dL — ABNORMAL LOW (ref 8.9–10.3)
Chloride: 108 mmol/L (ref 98–111)
Chloride: 103 mmol/L (ref 98–111)
Creatinine, Ser: 1.1 mg/dL (ref 0.61–1.24)
Creatinine, Ser: 1.1 mg/dL (ref 0.61–1.24)
GFR, Estimated: >60 mL/min (ref >60)
GFR, Estimated: >60 mL/min (ref >60)
Glucose, Bld: 149 mg/dL — ABNORMAL HIGH (ref 70–99)
Glucose, Bld: 136 mg/dL — ABNORMAL HIGH (ref 70–99)
Potassium: 4.1 mmol/L (ref 3.5–5.1)
Potassium: 4.5 mmol/L (ref 3.5–5.1)
Sodium: 135 mmol/L (ref 135–145)
Sodium: 133 mmol/L — ABNORMAL LOW (ref 135–145)

## 2021-05-07 LAB — POCT I-STAT 7, (LYTES, BLD GAS, ICA,H+H)
Acid-base deficit: 3 mmol/L — ABNORMAL HIGH (ref 0.0–2.0)
Acid-base deficit: 5 mmol/L — ABNORMAL HIGH (ref 0.0–2.0)
Acid-base deficit: 4 mmol/L — ABNORMAL HIGH (ref 0.0–2.0)
Acid-base deficit: 5 mmol/L — ABNORMAL HIGH (ref 0.0–2.0)
Bicarbonate: 22.3 mmol/L (ref 20.0–28.0)
Bicarbonate: 20.4 mmol/L (ref 20.0–28.0)
Bicarbonate: 21.7 mmol/L (ref 20.0–28.0)
Bicarbonate: 20.8 mmol/L (ref 20.0–28.0)
Calcium, Ion: 1.15 mmol/L (ref 1.15–1.40)
Calcium, Ion: 1.16 mmol/L (ref 1.15–1.40)
Calcium, Ion: 1.2 mmol/L (ref 1.15–1.40)
Calcium, Ion: 1.16 mmol/L (ref 1.15–1.40)
HCT: 24 % — ABNORMAL LOW (ref 39.0–52.0)
HCT: 27 % — ABNORMAL LOW (ref 39.0–52.0)
HCT: 29 % — ABNORMAL LOW (ref 39.0–52.0)
HCT: 29 % — ABNORMAL LOW (ref 39.0–52.0)
Hemoglobin: 8.2 g/dL — ABNORMAL LOW (ref 13.0–17.0)
Hemoglobin: 9.2 g/dL — ABNORMAL LOW (ref 13.0–17.0)
Hemoglobin: 9.9 g/dL — ABNORMAL LOW (ref 13.0–17.0)
Hemoglobin: 9.9 g/dL — ABNORMAL LOW (ref 13.0–17.0)
O2 Saturation: 100 %
O2 Saturation: 99 %
O2 Saturation: 97 %
O2 Saturation: 94 %
Patient temperature: 36.6
Patient temperature: 36.6
Patient temperature: 36.8
Patient temperature: 36.7
Potassium: 4.1 mmol/L (ref 3.5–5.1)
Potassium: 4 mmol/L (ref 3.5–5.1)
Potassium: 4.3 mmol/L (ref 3.5–5.1)
Potassium: 4.4 mmol/L (ref 3.5–5.1)
Sodium: 137 mmol/L (ref 135–145)
Sodium: 137 mmol/L (ref 135–145)
Sodium: 135 mmol/L (ref 135–145)
Sodium: 133 mmol/L — ABNORMAL LOW (ref 135–145)
TCO2: 24 mmol/L (ref 22–32)
TCO2: 22 mmol/L (ref 22–32)
TCO2: 23 mmol/L (ref 22–32)
TCO2: 22 mmol/L (ref 22–32)
pCO2 arterial: 41.1 mmHg (ref 32.0–48.0)
pCO2 arterial: 38 mmHg (ref 32.0–48.0)
pCO2 arterial: 42.1 mmHg (ref 32.0–48.0)
pCO2 arterial: 40.3 mmHg (ref 32.0–48.0)
pH, Arterial: 7.34 — ABNORMAL LOW (ref 7.350–7.450)
pH, Arterial: 7.337 — ABNORMAL LOW (ref 7.350–7.450)
pH, Arterial: 7.318 — ABNORMAL LOW (ref 7.350–7.450)
pH, Arterial: 7.32 — ABNORMAL LOW (ref 7.350–7.450)
pO2, Arterial: 193 mmHg — ABNORMAL HIGH (ref 83.0–108.0)
pO2, Arterial: 140 mmHg — ABNORMAL HIGH (ref 83.0–108.0)
pO2, Arterial: 103 mmHg (ref 83.0–108.0)
pO2, Arterial: 76 mmHg — ABNORMAL LOW (ref 83.0–108.0)

## 2021-05-07 LAB — BPAM FFP
Blood Product Expiration Date: 202301212359
Blood Product Expiration Date: 202301212359
ISSUE DATE / TIME: 202301181528
ISSUE DATE / TIME: 202301181528
Unit Type and Rh: 6200
Unit Type and Rh: 6200

## 2021-05-07 LAB — CBC
HCT: 22.4 % — ABNORMAL LOW (ref 39.0–52.0)
HCT: 28.8 % — ABNORMAL LOW (ref 39.0–52.0)
HCT: 29.9 % — ABNORMAL LOW (ref 39.0–52.0)
Hemoglobin: 7.4 g/dL — ABNORMAL LOW (ref 13.0–17.0)
Hemoglobin: 9.9 g/dL — ABNORMAL LOW (ref 13.0–17.0)
Hemoglobin: 10 g/dL — ABNORMAL LOW (ref 13.0–17.0)
MCH: 30.7 pg (ref 26.0–34.0)
MCH: 30.7 pg (ref 26.0–34.0)
MCH: 29.8 pg (ref 26.0–34.0)
MCHC: 33 g/dL (ref 30.0–36.0)
MCHC: 34.4 g/dL (ref 30.0–36.0)
MCHC: 33.4 g/dL (ref 30.0–36.0)
MCV: 92.9 fL (ref 80.0–100.0)
MCV: 89.2 fL (ref 80.0–100.0)
MCV: 89 fL (ref 80.0–100.0)
Platelets: 61 10*3/uL — ABNORMAL LOW (ref 150–400)
Platelets: 110 10*3/uL — ABNORMAL LOW (ref 150–400)
Platelets: UNDETERMINED 10*3/uL (ref 150–400)
RBC: 2.41 MIL/uL — ABNORMAL LOW (ref 4.22–5.81)
RBC: 3.23 MIL/uL — ABNORMAL LOW (ref 4.22–5.81)
RBC: 3.36 MIL/uL — ABNORMAL LOW (ref 4.22–5.81)
RDW: 13.1 % (ref 11.5–15.5)
RDW: 13.8 % (ref 11.5–15.5)
RDW: 14.3 % (ref 11.5–15.5)
WBC: 10 10*3/uL (ref 4.0–10.5)
WBC: 11.6 10*3/uL — ABNORMAL HIGH (ref 4.0–10.5)
WBC: 14.8 10*3/uL — ABNORMAL HIGH (ref 4.0–10.5)
nRBC: 0 % (ref 0.0–0.2)
nRBC: 0 % (ref 0.0–0.2)
nRBC: 0 % (ref 0.0–0.2)

## 2021-05-07 LAB — PREPARE FRESH FROZEN PLASMA
Unit division: 0
Unit division: 0

## 2021-05-07 LAB — PREPARE CRYOPRECIPITATE: Unit division: 0

## 2021-05-07 LAB — COOXEMETRY PANEL
Carboxyhemoglobin: 1.1 % (ref 0.5–1.5)
Methemoglobin: 1.1 % (ref 0.0–1.5)
O2 Saturation: 72.7 %
Total hemoglobin: 11.3 g/dL — ABNORMAL LOW (ref 12.0–16.0)

## 2021-05-07 LAB — PREPARE RBC (CROSSMATCH)

## 2021-05-07 LAB — MAGNESIUM
Magnesium: 2.6 mg/dL — ABNORMAL HIGH (ref 1.7–2.4)
Magnesium: 2.2 mg/dL (ref 1.7–2.4)

## 2021-05-07 LAB — BPAM CRYOPRECIPITATE
Blood Product Expiration Date: 202301182303
ISSUE DATE / TIME: 202301181736
Unit Type and Rh: 5100

## 2021-05-07 MED ORDER — FUROSEMIDE 10 MG/ML IJ SOLN: 20.0000 mg | Freq: Two times a day (BID) | INTRAMUSCULAR | Status: DC

## 2021-05-07 MED ORDER — SODIUM CHLORIDE 0.9% IV SOLUTION: Freq: Once | INTRAVENOUS | Status: AC

## 2021-05-07 MED ORDER — INSULIN ASPART 100 UNIT/ML IJ SOLN
0.0000 [IU] | INTRAMUSCULAR | Status: DC
  Administered 2021-05-07: 2 [IU] via SUBCUTANEOUS
  Administered 2021-05-07 (×2): 4 [IU] via SUBCUTANEOUS
  Administered 2021-05-08: 8 [IU] via SUBCUTANEOUS
  Administered 2021-05-08: 2 [IU] via SUBCUTANEOUS
  Administered 2021-05-08: 4 [IU] via SUBCUTANEOUS
  Administered 2021-05-09: 1 [IU] via SUBCUTANEOUS
  Administered 2021-05-09: 2 [IU] via SUBCUTANEOUS

## 2021-05-07 MED ORDER — MORPHINE SULFATE (PF) 2 MG/ML IV SOLN
1.0000 mg | INTRAVENOUS | Status: DC | PRN
  Administered 2021-05-07: 2 mg via INTRAVENOUS
  Filled 2021-05-07 (×4): qty 1

## 2021-05-07 MED ORDER — AMIODARONE HCL IN DEXTROSE 360-4.14 MG/200ML-% IV SOLN: 30.0000 mg/h | INTRAVENOUS | Status: DC

## 2021-05-07 MED ORDER — AMIODARONE LOAD VIA INFUSION
150.0000 mg | Freq: Once | INTRAVENOUS | Status: AC
  Administered 2021-05-07: 150 mg via INTRAVENOUS
  Filled 2021-05-07: qty 83.34

## 2021-05-07 MED ORDER — FUROSEMIDE 10 MG/ML IJ SOLN
20.0000 mg | Freq: Two times a day (BID) | INTRAMUSCULAR | Status: DC
  Administered 2021-05-07 – 2021-05-08 (×3): 20 mg via INTRAVENOUS
  Administered 2021-05-09: 40 mg via INTRAVENOUS
  Filled 2021-05-07 (×4): qty 2

## 2021-05-07 MED ORDER — CHLORHEXIDINE GLUCONATE 0.12 % MT SOLN
15.0000 mL | Freq: Two times a day (BID) | OROMUCOSAL | Status: DC
  Administered 2021-05-08 – 2021-05-21 (×24): 15 mL via OROMUCOSAL
  Filled 2021-05-07 (×18): qty 15

## 2021-05-07 MED ORDER — AMIODARONE LOAD VIA INFUSION
150.0000 mg | Freq: Once | INTRAVENOUS | Status: AC
Start: 2021-05-07 — End: 2021-05-07
  Administered 2021-05-07: 150 mg via INTRAVENOUS
  Filled 2021-05-07: qty 83.34

## 2021-05-07 MED ORDER — ORAL CARE MOUTH RINSE
15.0000 mL | Freq: Two times a day (BID) | OROMUCOSAL | Status: DC
  Administered 2021-05-07 – 2021-05-20 (×10): 15 mL via OROMUCOSAL

## 2021-05-07 MED ORDER — AMIODARONE HCL IN DEXTROSE 360-4.14 MG/200ML-% IV SOLN
30.0000 mg/h | INTRAVENOUS | Status: AC
  Administered 2021-05-07 (×3): 60 mg/h via INTRAVENOUS
  Administered 2021-05-08 (×2): 30 mg/h via INTRAVENOUS
  Administered 2021-05-08: 06:00:00 60 mg/h via INTRAVENOUS
  Filled 2021-05-07 (×6): qty 200

## 2021-05-07 MED FILL — Mannitol IV Soln 20%: INTRAVENOUS | Qty: 500 | Status: AC

## 2021-05-07 MED FILL — Lidocaine HCl (Cardiac) IV PF Soln 100 MG/5ML (2%): INTRAVENOUS | Qty: 5 | Status: AC

## 2021-05-07 MED FILL — Lidocaine HCl Local Preservative Free (PF) Inj 2%: INTRAMUSCULAR | Qty: 15 | Status: AC

## 2021-05-07 MED FILL — Potassium Chloride Inj 2 mEq/ML: INTRAVENOUS | Qty: 40 | Status: AC

## 2021-05-07 MED FILL — Sodium Bicarbonate IV Soln 8.4%: INTRAVENOUS | Qty: 50 | Status: AC

## 2021-05-07 MED FILL — Albumin, Human Inj 5%: INTRAVENOUS | Qty: 250 | Status: AC

## 2021-05-07 MED FILL — Sodium Chloride IV Soln 0.9%: INTRAVENOUS | Qty: 2000 | Status: AC

## 2021-05-07 MED FILL — Electrolyte-R (PH 7.4) Solution: INTRAVENOUS | Qty: 5000 | Status: AC

## 2021-05-07 MED FILL — Heparin Sodium (Porcine) Inj 1000 Unit/ML: Qty: 1000 | Status: AC

## 2021-05-07 MED FILL — Heparin Sodium (Porcine) Inj 1000 Unit/ML: INTRAMUSCULAR | Qty: 10 | Status: AC

## 2021-05-07 NOTE — Addendum Note (Signed)
Addendum  created 05/07/21 0747 by Josephine Igo, CRNA   Order list changed, Pharmacy for encounter modified

## 2021-05-07 NOTE — Progress Notes (Signed)
°  Transition of Care [facility]) Screening Note   Patient Details  Name: Randall Hodges Date of Birth: May 26, 1950   Transition of Care [facility]) CM/SW Contact:    Milas Gain, [place] Phone Number: 05/07/2021, 5:29 PM    Transition of Care Department [facility]) has reviewed patient and no TOC needs have been identified at this time. We will continue to monitor patient advancement through interdisciplinary progression rounds. If new patient transition needs arise, please place a TOC consult.

## 2021-05-07 NOTE — Procedures (Signed)
Extubation Procedure Note  Patient Details:   Name: Randall Hodges DOB: 1950-10-15 MRN: [id number]   Airway Documentation:    Vent end date: 05/07/21 Vent end time: 1005   Evaluation  O2 sats: stable throughout Complications: No apparent complications Patient did tolerate procedure well. Bilateral Breath Sounds: Rhonchi   Yes  Patient ws extubated to a 4L [place] without any complications, dyspnea or stridor noted. NIF: -20, VC: 800, positive cuff leak prior to extubation.   Tyrees Chopin, [place] 05/07/2021, 10:05 AM

## 2021-05-07 NOTE — Progress Notes (Signed)
CT surgery p.m. Rounds  Patient extubated conversant and feeling better after pain medicine Hemodynamics improving, AV sequentially paced at 90 P.m. labs satisfactory Episode of atrial fibrillation converted to sinus with IV amiodarone  Vitals:   05/07/21 1800 05/07/21 1900  BP: 119/69   Pulse: 100 100  Resp: (!) 23 20  Temp: 98.4 F (36.9 C) 98.4 F (36.9 C)  SpO2: 95% 94%

## 2021-05-07 NOTE — Progress Notes (Addendum)
Called Dr. Cyndia Bent regarding patient's increasing presser needs (now levo@15  and epi@3 )- patient has received albuminx4 and now LR bolus per PRN orders but BP not responding. BP 91/51(63) PAP 25/13(17) CO/CI 5.16/2.65. CT output has slowed- was 50/hr, now 20/hr serosanguinous drainage. 6 hour labs- hgb 7.5, hct 23.2, plts 62.  Orders received for 1 RBC and 1 Plt to be transfused. If BP does not respond to transfused products, administer additional 1 RBC.   AM lab results are PRE blood product administration.

## 2021-05-07 NOTE — Progress Notes (Addendum)
[facility] DAILY ICU PROGRESS NOTE                   [address]            [place] [address]          [phone]   1 Day Post-Op Procedure(s) (LRB): CORONARY ARTERY BYPASS GRAFTING (CABG) x3 on pump using left internal mammary artery and right endoscopic greater saphenous vein conduit. (N/A) TRANSESOPHAGEAL ECHOCARDIOGRAM (TEE) (N/A) APPLICATION OF CELL SAVER ENDOVEIN HARVEST OF GREATER SAPHENOUS VEIN (Right)  Total Length of Stay:  LOS: 2 days   Subjective: Responds to simple commands , somewhat sedated on vent  Objective: Vital signs in last 24 hours: Temp:  [95.5 F (35.3 C)-98.2 F (36.8 C)] 97.7 F (36.5 C) (01/19 0700) Pulse Rate:  [96-101] 100 (01/19 0801) Cardiac Rhythm: Atrial paced (01/19 0400) Resp:  [0-32] 18 (01/19 0801) BP: (82-118)/(52-81) 114/63 (01/19 0801) SpO2:  [100 %] 100 % (01/19 0801) Arterial Line BP: (86-115)/(46-61) 107/57 (01/19 0700) FiO2 (%):  [50 %] 50 % (01/19 0801) Weight:  [91.1 kg] 91.1 kg (01/19 0500)  Filed Weights   04/22/2021 1316 05/01/2021 1635 05/07/21 0500  Weight: 78.5 kg 77.6 kg 91.1 kg    Weight change: 12.6 kg   Hemodynamic parameters for last 24 hours: PAP: (13-37)/(4-20) 29/17 CO:  [3.9 L/min-5.5 L/min] 5.1 L/min CI:  [2 L/min/m2-2.8 L/min/m2] 2.6 L/min/m2  Intake/Output from previous day: 01/18 0701 - 01/19 0700 In: 9336.4 [I.V.:4520.5; Blood:2286; NG/GT:100; IV Piggyback:2429.9] Out: 8850 [Urine:2850; Chest Tube:870]  Vent Mode: SIMV;PSV;PRVC FiO2 (%):  [50 %] 50 % Set Rate:  [12 bmp] 12 bmp Vt Set:  [580 mL] 580 mL PEEP:  [5 cmH20] 5 cmH20 Pressure Support:  [10 cmH20] 10 cmH20 Plateau Pressure:  [9 cmH20-23 cmH20] 16 cmH20    Intake/Output this shift: No intake/output data recorded.  Current Meds: Scheduled Meds:  acetaminophen  1,000 mg Oral Q6H   Or   acetaminophen (TYLENOL) oral liquid 160 mg/5 mL  1,000 mg Per Tube Q6H   aspirin EC  325 mg Oral Daily   Or   aspirin  324 mg Per Tube  Daily   atorvastatin  80 mg Oral Daily   bisacodyl  10 mg Oral Daily   Or   bisacodyl  10 mg Rectal Daily   chlorhexidine gluconate (MEDLINE KIT)  15 mL Mouth Rinse BID   Chlorhexidine Gluconate Cloth  6 each Topical Daily   Chlorhexidine Gluconate Cloth  6 each Topical Daily   docusate sodium  200 mg Oral Daily   mouth rinse  15 mL Mouth Rinse 10 times per day   metoprolol tartrate  12.5 mg Oral BID   Or   metoprolol tartrate  12.5 mg Per Tube BID   [START ON 05/08/2021] pantoprazole  40 mg Oral Daily   sodium chloride flush  10-40 mL Intracatheter Q12H   sodium chloride flush  3 mL Intravenous Q12H   Continuous Infusions:  sodium chloride     sodium chloride     sodium chloride      ceFAZolin (ANCEF) IV Stopped (05/07/21 0542)   dexmedetomidine (PRECEDEX) IV infusion 0.3 mcg/kg/hr (05/07/21 0700)   epinephrine 3 mcg/min (05/07/21 0700)   insulin 4 Units/hr (05/07/21 0700)   lactated ringers     lactated ringers 20 mL/hr at 05/07/21 0700   milrinone 0.25 mcg/kg/min (05/07/21 0700)   nitroGLYCERIN Stopped (05/17/2021 1900)   norepinephrine (LEVOPHED) Adult infusion 9 mcg/min (05/07/21  0700)   phenylephrine (NEO-SYNEPHRINE) Adult infusion Stopped (05/17/2021 1900)   propofol (DIPRIVAN) infusion Stopped (05/13/2021 1900)   PRN Meds:.sodium chloride, dextrose, metoprolol tartrate, midazolam, morphine injection, ondansetron (ZOFRAN) IV, oxyCODONE, sodium chloride flush, sodium chloride flush, traMADol  General appearance: no distress Neurologic: intact Heart: regular rate and rhythm Lungs: clear anter/lat Abdomen: soft, non tender, absent BS, non distended Extremities: PAS in place, no edema Wound: dressings CDI  Lab Results: CBC: Recent Labs    04/21/2021 2235 05/07/21 0314 05/07/21 0559  WBC 10.1 10.0  --   HGB 7.5* 7.4* 8.2*  HCT 23.2* 22.4* 24.0*  PLT 62* 61*  --    BMET:  Recent Labs    04/20/2021 2235 05/07/21 0314 05/07/21 0559  NA 135 135 137  K 4.4 4.1 4.1  CL  105 108  --   CO2 21* 21*  --   GLUCOSE 184* 149*  --   BUN 10 10  --   CREATININE 1.13 1.10  --   CALCIUM 7.6* 7.6*  --     CMET: Lab Results  Component Value Date   WBC 10.0 05/07/2021   HGB 8.2 (L) 05/07/2021   HCT 24.0 (L) 05/07/2021   PLT 61 (L) 05/07/2021   GLUCOSE 149 (H) 05/07/2021   ALT 19 08/08/2020   AST 23 08/08/2020   NA 137 05/07/2021   K 4.1 05/07/2021   CL 108 05/07/2021   CREATININE 1.10 05/07/2021   BUN 10 05/07/2021   CO2 21 (L) 05/07/2021   INR 1.5 (H) 04/25/2021      PT/INR:  Recent Labs    05/07/2021 1715  LABPROT 18.0*  INR 1.5*   Radiology: [facility] Chest Port 1 View  Result Date: 05/07/2021 CLINICAL DATA:  Postop EXAM: PORTABLE CHEST 1 VIEW COMPARISON:  Chest x-ray dated May 06, 2021 FINDINGS: Unchanged position of ET tube, PA catheter and bilateral chest tubes. Enteric tube partially seen coursing below the diaphragm. Cardiac and mediastinal contours are unchanged status post median sternotomy and CABG. Unchanged bilateral reticular opacities. No large pleural effusion or evidence of pneumothorax. IMPRESSION: 1. Stable support devices. 2. No new parenchymal opacity. Electronically Signed   By: Yetta Glassman M.D.   On: 05/07/2021 09:05   DG Chest Port 1 View  Result Date: 04/27/2021 CLINICAL DATA:  Status post CABG, surgery follow-up. EXAM: PORTABLE CHEST 1 VIEW COMPARISON:  Chest x-ray 05/03/2021. FINDINGS: Patient is status post new cardiac surgery. There is enlargement of the cardiomediastinal silhouette, likely postsurgical. Endotracheal tube tip is 4.9 cm above the carina. Swan-Ganz catheter tip projects over the main pulmonary artery. Bilateral drainage catheters are in place. Enteric tube tip ends in the mid stomach. Scattered chronic appearing interstitial opacities throughout both lungs appear stable. There is no superimposed airspace consolidation, pleural effusion or pneumothorax. No acute fractures are seen. IMPRESSION: 1. New lines and  tubes as above. 2. Status post cardiac surgery. Enlargement of the cardiomediastinal silhouette is likely postsurgical. 3. Stable scattered chronic appearing interstitial opacities. Electronically Signed   By: Ronney Asters M.D.   On: 04/27/2021 17:15   VAS US DOPPLER PRE CABG  Result Date: 04/20/2021 PREOPERATIVE VASCULAR EVALUATION Patient Name:  WILKIN LIPPY  Date of Exam:   05/13/2021 Medical Rec #: [id number]        Accession #:    [id number] Date of Birth: [birth year]        Patient Gender: M Patient Age:   89 years Exam Location:  [facility] Procedure:  VAS US DOPPLER PRE CABG Referring Phys: Kenzlei Runions --------------------------------------------------------------------------------  Indications:      Pre-CABG. Risk Factors:     Coronary artery disease. Comparison Study: No prior study Performing Technologist: Maudry Mayhew MHA, RDMS, RVT, RDCS  Examination Guidelines: A complete evaluation includes B-mode imaging, spectral Doppler, color Doppler, and power Doppler as needed of all accessible portions of each vessel. Bilateral testing is considered an integral part of a complete examination. Limited examinations for reoccurring indications may be performed as noted.  Right Carotid Findings: +----------+--------+--------+--------+----------------------+--------+             PSV cm/s EDV cm/s Stenosis Describe               Comments  +----------+--------+--------+--------+----------------------+--------+  CCA Prox   106      20                                                 +----------+--------+--------+--------+----------------------+--------+  CCA Distal 87       19                                                 +----------+--------+--------+--------+----------------------+--------+  ICA Prox   53       17                smooth and hyperechoic           +----------+--------+--------+--------+----------------------+--------+  ICA Distal 111      30                                                  +----------+--------+--------+--------+----------------------+--------+  ECA        73       11                                                 +----------+--------+--------+--------+----------------------+--------+ +----------+--------+-------+----------------+------------+             PSV cm/s EDV cms Describe         Arm Pressure  +----------+--------+-------+----------------+------------+  Subclavian 134              Multiphasic, WNL               +----------+--------+-------+----------------+------------+ +---------+--------+--+--------+--+---------+  Vertebral PSV cm/s 61 EDV cm/s 16 Antegrade  +---------+--------+--+--------+--+---------+ Left Carotid Findings: +----------+--------+--------+--------+----------------------+--------+             PSV cm/s EDV cm/s Stenosis Describe               Comments  +----------+--------+--------+--------+----------------------+--------+  CCA Prox   95       21                                                 +----------+--------+--------+--------+----------------------+--------+  CCA Distal 75  21                                                 +----------+--------+--------+--------+----------------------+--------+  ICA Prox   69       27                focal and heterogenous           +----------+--------+--------+--------+----------------------+--------+  ICA Distal 65       29                                                 +----------+--------+--------+--------+----------------------+--------+  ECA        76       11                                                 +----------+--------+--------+--------+----------------------+--------+ +----------+--------+--------+----------------+------------+  Subclavian PSV cm/s EDV cm/s Describe         Arm Pressure  +----------+--------+--------+----------------+------------+             171               Multiphasic, WNL               +----------+--------+--------+----------------+------------+  +---------+--------+--+--------+--+---------+  Vertebral PSV cm/s 40 EDV cm/s 11 Antegrade  +---------+--------+--+--------+--+---------+  ABI Findings: +--------+------------------+-----+---------+--------+  Right    Rt Pressure (mmHg) Index Waveform  Comment   +--------+------------------+-----+---------+--------+  Brachial 126                      triphasic           +--------+------------------+-----+---------+--------+  PTA      151                1.20  triphasic           +--------+------------------+-----+---------+--------+  DP       145                1.15  triphasic           +--------+------------------+-----+---------+--------+ +--------+------------------+-----+---------+-------+  Left     Lt Pressure (mmHg) Index Waveform  Comment  +--------+------------------+-----+---------+-------+  Brachial 121                      triphasic          +--------+------------------+-----+---------+-------+  PTA      161                1.28  triphasic          +--------+------------------+-----+---------+-------+  DP       156                1.24  triphasic          +--------+------------------+-----+---------+-------+  Right Doppler Findings: +--------+--------+-----+---------+--------+  Site     Pressure Index Doppler   Comments  +--------+--------+-----+---------+--------+  Brachial 126            triphasic           +--------+--------+-----+---------+--------+  Radial  triphasic           +--------+--------+-----+---------+--------+  Ulnar                   triphasic           +--------+--------+-----+---------+--------+  Left Doppler Findings: +--------+--------+-----+---------+--------+  Site     Pressure Index Doppler   Comments  +--------+--------+-----+---------+--------+  Brachial 121            triphasic           +--------+--------+-----+---------+--------+  Radial                  triphasic           +--------+--------+-----+---------+--------+  Ulnar                   triphasic            +--------+--------+-----+---------+--------+  Summary: Right Carotid: The extracranial vessels were near-normal with only minimal wall                thickening or plaque. Left Carotid: The extracranial vessels were near-normal with only minimal wall               thickening or plaque. Vertebrals:  Bilateral vertebral arteries demonstrate antegrade flow. Subclavians: Normal flow hemodynamics were seen in bilateral subclavian              arteries. Right ABI: Resting right ankle-brachial index is within normal range. No evidence of significant right lower extremity arterial disease. Left ABI: Resting left ankle-brachial index is within normal range. No evidence of significant left lower extremity arterial disease. Right Upper Extremity: Doppler waveforms decrease 50% with right radial compression. Doppler waveforms remain within normal limits with right ulnar compression. Left Upper Extremity: Doppler waveform obliterate with left radial compression. Doppler waveforms remain within normal limits with left ulnar compression.  Electronically signed by Deitra Mayo MD on 04/27/2021 at 6:21:14 AM.    Final      Assessment/Plan: S/P Procedure(s) (LRB): CORONARY ARTERY BYPASS GRAFTING (CABG) x3 on pump using left internal mammary artery and right endoscopic greater saphenous vein conduit. (N/A) TRANSESOPHAGEAL ECHOCARDIOGRAM (TEE) (N/A) APPLICATION OF CELL SAVER ENDOVEIN HARVEST OF GREATER SAPHENOUS VEIN (Right)  POD#1  1 Afeb, SBP in 80's at times, mostly >100, CI/CO  and PAP pressures are good on epi, levo and milrinone- CO-OX 72, weaning Levo off 2 on vent, sats good, pPlat 16, minor acidosis- move to extubation as able using parameters 3 good UOP, weight 14 kg >preop if accurate- normal renal fxn- will eventually need some diuretic 4 expected ABLA has received products/blood- monitor closely 5 thrombocytopenia- monitor closely, most recent 61K- has received platelet transfusions 6 BS adeq  controlled on insulin gtt 7 CXR- no significant effus or opacities` 8 CT 870 cc- keep in place for now   Randall Hodges 05/07/2021 9:08 AM   Patient extubated, femoral aline out Weaning drips, CI  > 2.2 in a-paced rhythm Will leave swan in place, dangle in bed  patient examined and medical record reviewed,agree with above note. Dahlia Byes 05/07/2021

## 2021-05-07 NOTE — Progress Notes (Addendum)
Paged Dr. Prescott Gum regarding patient in afib RVR rates 120-140 s/p standing at bedside, unresponsive to PRN IV metoprolol. Patient also having runs of wide complex/NSVT.   Orders received for ABG, Amio bolus with gtt to remain at 60mg  if patient stays in afib. Down titrate epi gtt to 18mcg, and use levo gtt to maintain SBP >100.   If patient continues to have NSVT despite amio infusion and additional interventions, call MD back with probable plans to start lido gtt.  Addendum: Updated Dr. Prescott Gum on patient status. Improved HR (now 100-110's) but still in afib- patient remains on amio @60mg . Still some NSVT but lessened (was 5-8 beats in a row, now 2-4). CI 2.23, given ABG results.   Orders received for 1/2 amp bicarb and start lido gtt. If after 47mins, NSVT hasn't improved, increase lido dose to 2mg /hr.   Patient converted to NSR at 0525- strip saved in chart.

## 2021-05-07 NOTE — Op Note (Signed)
NAME: Randall Hodges, HALLUM MEDICAL RECORD NO: [id number] ACCOUNT NO: [id number] DATE OF BIRTH: 1950-12-31 FACILITY: [facility] LOCATION: [facility] PHYSICIAN: Ivin Poot III, MD  Operative Report   DATE OF PROCEDURE: 05/12/2021  PROCEDURES PERFORMED:   1.  Coronary artery bypass grafting x3 (left internal mammary artery to LAD, saphenous vein graft to obtuse marginal, saphenous vein graft to the distal posterolateral branch of the circumflex). 2.  Endoscopic harvest of right leg greater saphenous vein. 3.  Placement of right femoral A-line for blood pressure monitoring.  SURGEON:  Len Childs, MD.  ASSISTANT:  Jadene Pierini, PA-C.  A surgical first assistant was required for this procedure due to the standard of care for the complexity of this operation as well to provide the necessary surgical assistance and harvesting the saphenous vein  endoscopically and closing the leg incision, assisting with the dissection, suctioning, and exposure for the bypass grafts, assisting with the organization and orientation of the sutures for the coronary anastomoses, and general surgical assistance.  ANESTHESIA:  General by Dr. Annye Asa.  CLINICAL NOTE:  The patient is a 71 year old gentleman who was admitted urgently through the ER with chest pain and EKG changes consistent with STEMI protocol.  The patient underwent urgent catheterization by Dr. Tamala Julian, which demonstrated high-grade left  main stenosis and a left dominant circulation.  Echocardiogram showed preserved LV systolic function and no significant valvular disease.  Urgent surgical revascularization was recommended.  I saw the patient in consultation in the cardiac cath lab  holding area immediately following the cardiac catheterization and after reviewing the images of the catheterization and discussing the care of the patient with Dr. Tamala Julian for coordination of care.  We planned on stabilizing the patient with nitroglycerin  and heparin and  obtaining the preoperative studies and then surgery the next morning.  I discussed the procedure of CABG with the patient in detail including the use of general anesthesia, the use of general anesthesia and cardiopulmonary bypass, location of the surgical incisions, and the expected postoperative recovery.  I also discussed  all these issues with the patient's wife on the phone.  I discussed with the patient the risks to him of coronary bypass surgery including risk of stroke, bleeding, MI, arrhythmia, infection, organ failure, and death.  After reviewing these issues, he  demonstrated his understanding and agreed to proceed with surgery under what I felt was informed consent.  OPERATIVE FINDINGS:   1.  Good mammary artery conduit with good flow. 2.  Saphenous vein conduit with good flow, but a small size relative to the patient, 2.5-3 mm diameter. 3.  Slow recovery of the LV function after release of the cross-clamp, possibly due to more time required for reperfusion  in  smaller vein conduits, difficulty with myocardial protection  keeping the myocardium cooled less than 15 degrees despite adequate antegrade and retrograde cardioplegia.   PROCEDURE:  The patient was brought to the operating room and placed supine on the operating table.  General anesthesia was induced under invasive hemodynamic monitoring.  The chest, abdomen and legs were prepped with Betadine and draped as a sterile  field.  The anesthesiologist placed a transesophageal echo probe.  A proper timeout was performed and a sternal incision was made as the saphenous vein was harvested endoscopically from the right leg.  The left internal mammary artery was harvested as a pedicle graft from its origin at the subclavian vessels.  It was a  good vessel with adequate flow.  Sternal  retractor was placed and the pericardium was opened and suspended.  The ascending aorta was inspected and palpated and found to be mildly thickened, but no  calcified plaque.  Pursestrings were placed in the  ascending aorta and right atrium and after heparin was administered, the ACT was documented as being therapeutic and the patient was cannulated and placed on bypass.  Cardioplegia cannulas were placed for both antegrade aortic and retrograde coronary  sinus catheters.  The mammary artery and vein grafts were prepared for the distal anastomosis.  The patient was cooled to 32 degrees and the aortic crossclamp was applied.  1.2 liters of cold blood cardioplegia was delivered in split doses between the  antegrade aortic and retrograde coronary sinus catheters.  There was good cardioplegic arrest with loss of electrical activity; however, the septal temperature remained measured at 21 degrees.  However, with adequate cardioplegia, topical ice and  isoelectric activity on the EKG, I felt that there was adequate protection and suspected the temperature monitoring system may have been not completely accurate.  We proceeded with the distal coronary anastomosis.  The first distal anastomosis was to the posterolateral branch of the dominant left coronary.  This was a small 1.2 mm vessel and a reverse saphenous vein was sewn end-to-side with running 7-0 Prolene  with good flow through the graft.  The second distal anastomosis was the OM1 branch of the circumflex.  This was a larger 1.5 mm vessel proximal left main stenosis and a reverse saphenous vein was sewn end-to-side with running 7-0 Prolene with good flow  through the graft.  The third distal anastomosis was to the mid LAD.  There was a 1.7 mm vessel with high-grade proximal left main stenosis.  The left IMA pedicle was brought through an opening created and the left lower pericardium was brought down onto  the LAD and sewn end-to-side with a running 8-0 Prolene.  There was good flow through the anastomosis; however, after tying the 8-0 Prolene, the suture broke and the anastomosis was then created in exactly  the same fashion.  After the second  anastomosis, there was good hemostasis and good flow through the graft and in the mammary pedicle, bulldog was reapplied.  The pedicle was secured to the epicardium.  After cardioplegia was delivered, the 2 proximal vein anastomoses were performed on the ascending aorta using a 4.5 mm punch and running 6-0 Prolene.  Prior to tying down, the proximal anastomosis, air was vented from the coronaries with a dose of  retrograde warm blood cardioplegia and the usual de-airing maneuvers on bypass. The crossclamp was removed.  The heart started a spontaneous rhythm.  The vein grafts were de-aired and opened and each had a good flow.  Proximal and distal anastomoses were checked and found to be hemostatic.  The patient was rewarmed and reperfused.  Rewarming and reperfusion  took longer than expected and a femoral A-line was placed for accurate blood pressure monitoring.  After rewarming and reperfusion for approximately 45 minutes on low dose inotropic support, the patient was separated from cardiopulmonary bypass.  The  echo showed ejection fraction of 40%.  The patient was A-paced and had a cardiac output of 4 liters.  Protamine was administered and the cannulas were removed.  There was still some diffuse coagulopathy and a TEG coagulation profile was checked and this  indicated the patient needed more FFP, which was given.  After adequate hemostasis, the superior pericardial valve was closed over the aorta and vein grafts.  Anterior mediastinal and bilateral pleural tubes were placed and brought out through separate incisions.  The patient remained stable.  The sternal  wires were placed.  The chest was closed.  The patient remained stable.  The pectoralis fascia was closed with a running #1 Vicryl.  The subcutaneous and skin layers were closed with running Vicryl and sterile dressings were applied.  Total  cardiopulmonary bypass time was 158 minutes.     PAA D:  05/04/2021 7:04:03 pm T: 05/07/2021 1:56:00 am  JOB: [id number]

## 2021-05-08 ENCOUNTER — Ambulatory Visit: Payer: PPO | Admitting: Cardiology

## 2021-05-08 ENCOUNTER — Inpatient Hospital Stay (HOSPITAL_COMMUNITY): Payer: PPO

## 2021-05-08 LAB — POCT I-STAT, CHEM 8
BUN: 18 mg/dL (ref 8–23)
Calcium, Ion: 1.17 mmol/L (ref 1.15–1.40)
Chloride: 95 mmol/L — ABNORMAL LOW (ref 98–111)
Creatinine, Ser: 1 mg/dL (ref 0.61–1.24)
Glucose, Bld: 133 mg/dL — ABNORMAL HIGH (ref 70–99)
HCT: 31 % — ABNORMAL LOW (ref 39.0–52.0)
Hemoglobin: 10.5 g/dL — ABNORMAL LOW (ref 13.0–17.0)
Potassium: 3.9 mmol/L (ref 3.5–5.1)
Sodium: 130 mmol/L — ABNORMAL LOW (ref 135–145)
TCO2: 22 mmol/L (ref 22–32)

## 2021-05-08 LAB — GLUCOSE, CAPILLARY
Glucose-Capillary: 203 mg/dL — ABNORMAL HIGH (ref 70–99)
Glucose-Capillary: 159 mg/dL — ABNORMAL HIGH (ref 70–99)
Glucose-Capillary: 140 mg/dL — ABNORMAL HIGH (ref 70–99)
Glucose-Capillary: 95 mg/dL (ref 70–99)
Glucose-Capillary: 116 mg/dL — ABNORMAL HIGH (ref 70–99)
Glucose-Capillary: 124 mg/dL — ABNORMAL HIGH (ref 70–99)

## 2021-05-08 LAB — BASIC METABOLIC PANEL
Anion gap: 9 (ref 5–15)
BUN: 14 mg/dL (ref 8–23)
CO2: 21 mmol/L — ABNORMAL LOW (ref 22–32)
Calcium: 8 mg/dL — ABNORMAL LOW (ref 8.9–10.3)
Chloride: 100 mmol/L (ref 98–111)
Creatinine, Ser: 1.15 mg/dL (ref 0.61–1.24)
GFR, Estimated: >60 mL/min (ref >60)
Glucose, Bld: 201 mg/dL — ABNORMAL HIGH (ref 70–99)
Potassium: 4.3 mmol/L (ref 3.5–5.1)
Sodium: 130 mmol/L — ABNORMAL LOW (ref 135–145)

## 2021-05-08 LAB — POCT I-STAT 7, (LYTES, BLD GAS, ICA,H+H)
Acid-base deficit: 3 mmol/L — ABNORMAL HIGH (ref 0.0–2.0)
Bicarbonate: 22.7 mmol/L (ref 20.0–28.0)
Calcium, Ion: 1.19 mmol/L (ref 1.15–1.40)
HCT: 29 % — ABNORMAL LOW (ref 39.0–52.0)
Hemoglobin: 9.9 g/dL — ABNORMAL LOW (ref 13.0–17.0)
O2 Saturation: 92 %
Patient temperature: 36.3
Potassium: 4.4 mmol/L (ref 3.5–5.1)
Sodium: 133 mmol/L — ABNORMAL LOW (ref 135–145)
TCO2: 24 mmol/L (ref 22–32)
pCO2 arterial: 41 mmHg (ref 32.0–48.0)
pH, Arterial: 7.347 — ABNORMAL LOW (ref 7.350–7.450)
pO2, Arterial: 64 mmHg — ABNORMAL LOW (ref 83.0–108.0)

## 2021-05-08 LAB — PREPARE PLATELET PHERESIS: Unit division: 0

## 2021-05-08 LAB — COOXEMETRY PANEL
Carboxyhemoglobin: 0.9 % (ref 0.5–1.5)
Methemoglobin: 0.8 % (ref 0.0–1.5)
O2 Saturation: 50.9 %
Total hemoglobin: 9.6 g/dL — ABNORMAL LOW (ref 12.0–16.0)

## 2021-05-08 LAB — BPAM PLATELET PHERESIS
Blood Product Expiration Date: 202301192359
ISSUE DATE / TIME: 202301190323
Unit Type and Rh: 5100

## 2021-05-08 LAB — COMPREHENSIVE METABOLIC PANEL
ALT: 27 U/L (ref 0–44)
AST: 154 U/L — ABNORMAL HIGH (ref 15–41)
Albumin: 3 g/dL — ABNORMAL LOW (ref 3.5–5.0)
Alkaline Phosphatase: 40 U/L (ref 38–126)
Anion gap: 8 (ref 5–15)
BUN: 15 mg/dL (ref 8–23)
CO2: 22 mmol/L (ref 22–32)
Calcium: 8 mg/dL — ABNORMAL LOW (ref 8.9–10.3)
Chloride: 97 mmol/L — ABNORMAL LOW (ref 98–111)
Creatinine, Ser: 1.2 mg/dL (ref 0.61–1.24)
GFR, Estimated: >60 mL/min (ref >60)
Glucose, Bld: 135 mg/dL — ABNORMAL HIGH (ref 70–99)
Potassium: 4 mmol/L (ref 3.5–5.1)
Sodium: 127 mmol/L — ABNORMAL LOW (ref 135–145)
Total Bilirubin: 0.7 mg/dL (ref 0.3–1.2)
Total Protein: 5.3 g/dL — ABNORMAL LOW (ref 6.5–8.1)

## 2021-05-08 LAB — CBC
HCT: 29.9 % — ABNORMAL LOW (ref 39.0–52.0)
Hemoglobin: 10.1 g/dL — ABNORMAL LOW (ref 13.0–17.0)
MCH: 30.6 pg (ref 26.0–34.0)
MCHC: 33.8 g/dL (ref 30.0–36.0)
MCV: 90.6 fL (ref 80.0–100.0)
Platelets: 90 10*3/uL — ABNORMAL LOW (ref 150–400)
RBC: 3.3 MIL/uL — ABNORMAL LOW (ref 4.22–5.81)
RDW: 14.7 % (ref 11.5–15.5)
WBC: 15.9 10*3/uL — ABNORMAL HIGH (ref 4.0–10.5)
nRBC: 0 % (ref 0.0–0.2)

## 2021-05-08 MED ORDER — ENSURE ENLIVE PO LIQD
237.0000 mL | Freq: Three times a day (TID) | ORAL | Status: DC
  Administered 2021-05-09 – 2021-05-14 (×7): 237 mL via ORAL

## 2021-05-08 MED ORDER — LIDOCAINE IN D5W 4-5 MG/ML-% IV SOLN
1.0000 mg/min | INTRAVENOUS | Status: DC
  Administered 2021-05-08: 1 mg/min via INTRAVENOUS
  Filled 2021-05-08: qty 500

## 2021-05-08 MED ORDER — SODIUM BICARBONATE 8.4 % IV SOLN
25.0000 meq | Freq: Once | INTRAVENOUS | Status: AC
  Administered 2021-05-08: 25 meq via INTRAVENOUS
  Filled 2021-05-08: qty 50

## 2021-05-08 MED ORDER — INSULIN DETEMIR 100 UNIT/ML ~~LOC~~ SOLN
8.0000 [IU] | Freq: Two times a day (BID) | SUBCUTANEOUS | Status: DC
  Administered 2021-05-08 – 2021-05-09 (×3): 8 [IU] via SUBCUTANEOUS
  Filled 2021-05-08 (×6): qty 0.08

## 2021-05-08 MED ORDER — METOCLOPRAMIDE HCL 5 MG/ML IJ SOLN
5.0000 mg | Freq: Three times a day (TID) | INTRAMUSCULAR | Status: AC
  Administered 2021-05-08 – 2021-05-09 (×3): 5 mg via INTRAVENOUS
  Filled 2021-05-08 (×3): qty 2

## 2021-05-08 NOTE — Discharge Summary (Incomplete)
[address]       [place] [address]             [phone]    Physician Discharge Summary  Patient ID: Randall Hodges MRN: [id number] DOB/AGE: [birth year] 71 y.o.  Admit date: 05/19/2021 Discharge date: 05/17/2021  Admission Diagnoses:  Patient Active Problem List   Diagnosis Date Noted   AKI (acute kidney injury) ([place])    Intractable hiccups    S/P CABG x 3 05/08/2021   Acute coronary syndrome ([place]) 05/10/2021   Malignant neoplasm of prostate ([place]) 08/02/2018     Discharge Diagnoses:  Patient Active Problem List   Diagnosis Date Noted   AKI (acute kidney injury) ([place])    Intractable hiccups    S/P CABG x 3 05/10/2021   Acute coronary syndrome ([place]) 04/29/2021   Malignant neoplasm of prostate ([place]) 08/02/2018   Discharged Condition: {condition:18240}  Primary Care: [name], MD Primary Cardiologist:Gayatri Stann Mainland, MD   History of Present Illness:       Patient examined, images of coronary arteriogram and echocardiogram personally reviewed and counseled with patient. [name] was hospitalized via the ER today for STEMI when he developed chest pain in the early a.m. while in bed which persisted.  In the ED his troponin was not elevated but he did had some EKG changes.  Cardiac catheterization by Dr. Daneen Schick demonstrates significant left main stenosis with some disease in a small nondominant RCA.  LV function is normal and LVEDP is normal.  The patient is feeling improved on IV nitroglycerin and heparin protocol was ordered.  He is in a sinus rhythm and is breathing comfortably.  Chest x-ray shows no evidence of edema.  Carotid duplex ultrasound shows no significant stenosis.  There is a positive family history of coronary artery disease.  Patient and all relevant studies were reviewed by Dr. Darcey Nora recommended proceeding with coronary artery bypass grafting.  Hospital course:  The patient was  medically stabilized and felt to be ready to proceed with surgery on 05/10/2021.  He was taken to the operating room at which time he underwent coronary artery bypass grafting x3.  He tolerated the procedure well was taken to the surgical intensive care unit in stable condition.  Postoperative hospital course:   n postoperative day 1 he remained on the vent.  He was able to be extubated during the day.  He initially has required multiple inotrope and pressor agents but these have been able to be weaned off over time.  He has been on low dose epinephrine, Levophed and milrinone.  He has been titrated off over the first few postoperative days.  He does have an expected acute blood loss anemia.  He has required blood products and blood transfusion.  He has a thrombocytopenia which is being monitored closely.  Levels are improving over time.  Renal function has remained within normal limits.  He did develop postoperative atrial fibrillation and was started on amiodarone wit resolution of the atrial fibrillation.  He also had some episode of nonsustained ventricular tachycardia and was placed on a lidocaine drip on postop day #2.  Amiodarone was transitioned from intravenous to oral route.  He had some postoperative confusion which  improved over time.  Pain control was somewhat difficult but also improved with time. By post-op day 5, he was ready for transfer to 4E Progressive Care. Activity was advanced and well tolerated. He had hiccups off and  on during the week after surgery.  This persisted despite treatment with Protonix, Reglan, and Baclofen. The Baclofen was discontinued due to hallucinations and replaced with thorazine IM TID which was also not helpful. On post-op day 8, he was started on gabapentin 300mg  TID which seemed to quiet the hiccups. The GI service was consulted and recommended holding off on GI work up unless the gabapentin treatment proved unsuccessful. After being in [place] for several days, he had  recurrent atrial fibrillation on 05/14/21.  He was re-bolused with IV amiodarone and was started on Eliquis. He converted back to SR. The patient's CXR showed evidence of pulmonary edema and opacification.  Advanced heart failure team was consulted for assistance.  The patient was aggressively diuresed with IV Lasix and Metolazone.  He responded well and diuresed several liters of fluid.  He unfortunately developed an AKI with creatinine bumping to 1.79.  He also developed hypotension with SBP in the 60s.  He was treated with a 500 NS bolus and his entreso and coreg were discontinued.  He again developed into atrial fibrillation and was restarted on the Amiodarone drip.  EKG was suggestive of restrictive pericarditis and colchicine was initiated.  In addition to Gabapentin, he was started on Valium for additional relief of hiccoughs.  This provided relief, however patient was very somnolent and the doses were adjusted. On post-op day 11 he developed hypotension, worsening renal function,  and was noted to have worsening infiltrates on his CXR consistent with aspiration pneumonia. He did not have adequate IV access for fluid resuscitation so he was transferred to the ICU where a PICC was placed.  He was placed on IV Maxipime.  The oral anticoagulation was discontinued until an enlarging pericardial effusion could be ruled out. His hypotension recurred on the following day. He was given additional IV albumin and  saline bolus with minimal effect. A levophed infusion was initiated with appropriate response in BP.  An arterial line was also placed for more accurate monitoring.  An echocardiogram on that date had findings c/w  constrictive pericarditis.  He was returned to the cath lab on 1/30 by cardiology for for a right heart cath to further evaluate hemodynamics.  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  Over the next 24  hours, his respiratory status declined further and radiographic findings on chest x-ray were consistent with aspiration pneumonia.  He developed worsening hypoxia and hypercarbia.  On 05/24/2021, bronchoscopy was carried out by Dr. Tacy Learn of critical care medicine team.  Cultures were obtained.  The patient was intubated and placed on mechanical ventilation.  Despite this, he continued to have severe he was proned briefly without any improvement.  After this, the decision was made to proceed with for VV ECMO.  This was accomplished in the Cath Lab in the afternoon of 06/02/2021.  This resulted in normalization of his blood gas.  Antibiotics were continued with broadening of coverage with meropenem and vancomycin.  He was supported nutritionally with tube feedings by way of a CorTrac postpyloric feeding tube.   Consults:  Gastroenterology, Critical Care Medicine, Advanced Heart Failure  Significant Diagnostic Studies:   DG Chest 2 View  Result Date: 05/14/2021 CLINICAL DATA:  Postoperative CABG, 7 days ago with weakness. EXAM: CHEST - 2 VIEW COMPARISON:  May 13, 2021. FINDINGS: EKG leads project over the chest. Median sternotomy changes and CABG. Stable appearance of cardiomediastinal contours accounting for rotation on  the current study. Interstitial and airspace opacities are similar with upper lobe predominance when compared to previous imaging. There is further opacification however at the LEFT lung base when compared to the previous study and suspected bilateral effusions with blunting of LEFT and RIGHT costodiaphragmatic sulci have developed since previous imaging. On limited assessment there is no acute skeletal process. IMPRESSION: 1. Persistent interstitial and airspace opacities with upper lobe predominance. 2. Worsening basilar airspace process on the LEFT and enlarging pleural fluid collections bilaterally. 3. Findings remain concerning for pneumonia with worsening. Would also correlate with any  signs of heart failure. Electronically Signed   By: Zetta Bills M.D.   On: 05/14/2021 10:10   DG Chest 2 View  Result Date: 05/13/2021 CLINICAL DATA:  Chest pain. EXAM: CHEST - 2 VIEW COMPARISON:  May 12, 2021. FINDINGS: Stable cardiomegaly. Status post coronary bypass graft. Stable bilateral lung opacities are noted concerning for multifocal pneumonia. Small pleural effusions may be present. Minimally displaced right second rib fracture is noted. IMPRESSION: Stable bilateral opacities are noted concerning for pneumonia. Minimally displaced right second rib fracture is noted. Electronically Signed   By: Marijo Conception M.D.   On: 05/13/2021 08:42   CARDIAC CATHETERIZATION  Result Date: 05/11/2021 CONCLUSIONS: Severe left coronary calcification including left main circumflex and LAD. Severe distal left main bifurcation disease involving ostial LAD and ostial circumflex ([place] 111). LAD is heavily calcified with ostial 90%, and tandem mid 60% and 80% more distal. 70% mid circumflex.  Second obtuse marginal 60%. Nondominant right coronary Normal LV function and LVEDP RECOMMENDATIONS: Ongoing ambiguous chest discomfort with normal troponin I but with dynamic EKG changes IV nitroglycerin and IV heparin. Discussed case with Dr. Roxan Hockey in the Cath Lab.  Shared decision to have coronary bypass surgery soon but currently does not meet criteria for emergency CABG.   DG CHEST PORT 1 VIEW  Result Date: 05/17/2021 CLINICAL DATA:  Follow-up exam.  Pleural effusion. EXAM: PORTABLE CHEST 1 VIEW COMPARISON:  05/14/2021 and older studies. FINDINGS: Stable changes from prior CABG surgery. Cardiac silhouette mildly enlarged. Bilateral interstitial and airspace lung opacities are noted, with a possible mild increase in airspace opacity in the right mid lung, otherwise unchanged. Possible small effusions.  No pneumothorax. No change in posterior right first and second rib fractures IMPRESSION: 1. Mild increase in  airspace lung opacity in the right mid lung compared to the most recent prior study. 2. No other change. Remaining airspace lung opacities are stable consistent with multifocal pneumonia. Electronically Signed   By: Lajean Manes M.D.   On: 05/17/2021 10:53   DG Chest Port 1 View  Result Date: 05/12/2021 CLINICAL DATA:  Status post coronary bypass graft. EXAM: PORTABLE CHEST 1 VIEW COMPARISON:  May 11, 2021. FINDINGS: Stable cardiomegaly. Status post coronary bypass graft. Stable bilateral upper lobe and basilar airspace opacities are noted concerning for pneumonia. Bony thorax is unremarkable. IMPRESSION: Stable bilateral opacities are noted concerning for multifocal pneumonia. Electronically Signed   By: Marijo Conception M.D.   On: 05/12/2021 08:17   DG Chest Port 1 View  Result Date: 05/11/2021 CLINICAL DATA:  71 year old male status post CABG. EXAM: PORTABLE CHEST 1 VIEW COMPARISON:  Chest x-ray 05/10/2021. FINDINGS: Right IJ Cordis with tip terminating in the mid superior vena cava. Patchy multifocal areas of interstitial prominence and airspace consolidation are noted in the lungs bilaterally, most confluent in the upper lobes of the lungs bilaterally. Small bilateral pleural effusions. No pneumothorax. Pulmonary vasculature does  not appearing origin. Heart size is mildly enlarged. Upper mediastinal contours are within normal limits. Status post median sternotomy for CABG. IMPRESSION: 1. Support apparatus and postoperative changes, as above. 2. Findings are concerning for progressively worsening multilobar bilateral bronchopneumonia, most evident in the upper lobes of the lungs bilaterally. 3. Small bilateral pleural effusions. Electronically Signed   By: Vinnie Langton M.D.   On: 05/11/2021 08:03   DG Chest Port 1 View  Result Date: 05/10/2021 CLINICAL DATA:  Status post CABG EXAM: PORTABLE CHEST 1 VIEW COMPARISON:  05/09/2021 FINDINGS: Postop change from median sternotomy and CABG procedure  is again noted. Stable cardiomediastinal contours. Right IJ catheter tip projects over the SVC. Interval removal of bilateral chest tubes. No pneumothorax visualized. Small bilateral pleural effusions and mild interstitial edema is stable. IMPRESSION: Interval removal of bilateral chest tubes. No pneumothorax. Electronically Signed   By: Kerby Moors M.D.   On: 05/10/2021 08:13   DG Chest Port 1 View  Result Date: 05/09/2021 CLINICAL DATA:  Chest pain and shortness of breath. EXAM: PORTABLE CHEST 1 VIEW COMPARISON:  Chest radiograph 05/16/2021. FINDINGS: Interval retraction pulmonary arterial catheter. Right chest tube and left chest tube remain in place. Monitoring leads overlie the patient. Stable cardiomegaly status post median sternotomy. Redemonstrated bilateral airspace opacities. Persistent small effusions bilaterally. No definite pneumothorax. IMPRESSION: Interval retraction PA catheter. Similar bilateral airspace opacities. Electronically Signed   By: Lovey Newcomer M.D.   On: 05/09/2021 09:26   DG Chest Port 1 View  Result Date: 05/08/2021 CLINICAL DATA:  Post CABG, chest tube present EXAM: PORTABLE CHEST 1 VIEW COMPARISON:  05/07/2021 FINDINGS: Endotracheal and enteric tubes are no longer present. Right IJ Swan-Ganz catheter is unchanged. Bilateral chest tubes remain present. No pneumothorax. Probable small pleural effusions with adjacent atelectasis. Chronic interstitial changes. Similar cardiomediastinal contours. IMPRESSION: Lines and tubes as above.  No pneumothorax. Probable small pleural diffusion with adjacent atelectasis superimposed on chronic changes. Electronically Signed   By: Macy Mis M.D.   On: 05/08/2021 08:48   DG Chest Port 1 View  Result Date: 05/07/2021 CLINICAL DATA:  Postop EXAM: PORTABLE CHEST 1 VIEW COMPARISON:  Chest x-ray dated May 06, 2021 FINDINGS: Unchanged position of ET tube, PA catheter and bilateral chest tubes. Enteric tube partially seen coursing  below the diaphragm. Cardiac and mediastinal contours are unchanged status post median sternotomy and CABG. Unchanged bilateral reticular opacities. No large pleural effusion or evidence of pneumothorax. IMPRESSION: 1. Stable support devices. 2. No new parenchymal opacity. Electronically Signed   By: Yetta Glassman M.D.   On: 05/07/2021 09:05   DG Chest Port 1 View  Result Date: 04/20/2021 CLINICAL DATA:  Status post CABG, surgery follow-up. EXAM: PORTABLE CHEST 1 VIEW COMPARISON:  Chest x-ray 04/24/2021. FINDINGS: Patient is status post new cardiac surgery. There is enlargement of the cardiomediastinal silhouette, likely postsurgical. Endotracheal tube tip is 4.9 cm above the carina. Swan-Ganz catheter tip projects over the main pulmonary artery. Bilateral drainage catheters are in place. Enteric tube tip ends in the mid stomach. Scattered chronic appearing interstitial opacities throughout both lungs appear stable. There is no superimposed airspace consolidation, pleural effusion or pneumothorax. No acute fractures are seen. IMPRESSION: 1. New lines and tubes as above. 2. Status post cardiac surgery. Enlargement of the cardiomediastinal silhouette is likely postsurgical. 3. Stable scattered chronic appearing interstitial opacities. Electronically Signed   By: Ronney Asters M.D.   On: 04/19/2021 17:15   DG Chest Port 1 View  Result Date: 04/29/2021  CLINICAL DATA:  Chest pain, STEMI EXAM: PORTABLE CHEST 1 VIEW COMPARISON:  Portable exam 1348 hours compared to CT chest of 06/03/2017 FINDINGS: Normal heart size mediastinal contours. BILATERAL pleuroparenchymal scarring. Peripheral opacities in both lungs, similar to prior CT exam, likely peripheral chronic interstitial lung disease changes. No new infiltrate, pleural effusion, or pneumothorax. Bones demineralized. IMPRESSION: Peripheral chronic interstitial lung disease changes with biapical scarring, similar to prior CT. No acute abnormalities  Electronically Signed   By: Lavonia Dana M.D.   On: 04/27/2021 13:57   ECHO INTRAOPERATIVE TEE  Result Date: 04/23/2021  *INTRAOPERATIVE TRANSESOPHAGEAL REPORT *  Patient Name:   Randall Hodges Date of Exam: 05/10/2021 Medical Rec #:  [id number]       Height:       70.0 in Accession #:    [id number]      Weight:       171.1 lb Date of Birth:  [birth year]       BSA:          1.95 m Patient Age:    67 years        BP:           107/75 mmHg Patient Gender: M               HR:           63 bpm. Exam Location:  Inpatient Transesophogeal exam was perform intraoperatively during surgical procedure. Patient was closely monitored under general anesthesia during the entirety of examination. Indications:     I25.110 Atherosclerotic heart disease of native coronary artery                  with unstable angina pectoris Sonographer:     Roseanna Rainbow RDCS Performing Phys: [place] VANTRIGT Diagnosing Phys: Annye Asa MD Complications: No known complications during this procedure. POST-OP IMPRESSIONS Limited post-CPB exam: The patient required inotropic support to separate from CPB, as well as significant time to assure air was evacuated from the heart _ Left Ventricle: The left ventricular function was severely reduced on initial attempt at separation from CPB. The antero-lateral and posterior segmanets appeared akinetic. The patient was returend to CPB. Inotropic support with Milrinone and Epinephrine was provided, and the ventricular function improved significantly. By the end of surgery, the LV improved to moderately reduced systolic function. Overall EF 37%, with mod hypokinesis of the antero-lateral and posterior walls. _ Right Ventricle: The right ventricle appears unchanged from pre-bypass. _ Aortic Valve: The aortic valve function appears unchanged from pre-bypass images. _ Mitral Valve: The mitral valve function appears unchanged from pre-bypass images. There is mild MR. _ Tricuspid Valve: The tricuspid valve  function appears unchanged from pre-bypass images. PRE-OP FINDINGS  Left Ventricle: The left ventricle has hyperdynamic systolic function, with an ejection fraction of >65%, measured 71%. The cavity size was normal. No evidence of left ventricular regional wall motion abnormalities. There is no left ventricular hypertrophy. Left ventricular diastolic parameters were normal. Right Ventricle: The right ventricle has normal systolic function. The cavity was normal. There is no increase in right ventricular wall thickness. Catheter present in the right ventricle. Left Atrium: Left atrial size was normal in size. No left atrial/left atrial appendage thrombus was detected. The left atrial appendage is well visualized and there is no evidence of thrombus present. Left atrial appendage velocity is normal at greater than 40 cm/s. Right Atrium: Right atrial size was normal in size. Interatrial Septum: No atrial level shunt detected by  color flow Doppler. Pericardium: There is no evidence of pericardial effusion. There is no pleural effusion. Mitral Valve: The mitral valve is normal in structure. Mitral valve regurgitation is trivial by color flow Doppler. There is no evidence of mitral valve vegetation. Pulmonary venous flow is normal. There is no evidence of mitral stenosis, with peak gradient 4 mmHg, mean gradient 1 mmHg. Tricuspid Valve: The tricuspid valve was normal in structure. Tricuspid valve regurgitation was not visualized by color flow Doppler. No evidence of tricuspid stenosis is present. There is no evidence of tricuspid valve vegetation. Aortic Valve: The aortic valve is tricuspid. Aortic valve regurgitation was not visualized by color flow Doppler. There is no stenosis of the aortic valve, with peak gradient 8 mmHg, mean gradient 3 mmHg. There is no evidence of aortic valve vegetation. LVOT measures 2.4 cm. Pulmonic Valve: The pulmonic valve was normal in structure, with normal leaflet mobility and excursion.  No evidence of pulmonic stenosis. Pulmonic valve regurgitation is trivial, around the PA catheter, by color flow Doppler. Aorta: The aortic root, ascending aorta and aortic arch are normal in size and structure. There is evidence of sparse, scattered plaque in the descending aorta; Grade I, measuring 1-71mm in size. Pulmonary Artery: Gordy Councilman catheter present on the right. The pulmonary artery is of normal size. Venous: The inferior vena cava is normal in size with greater than 50% respiratory variability, suggesting right atrial pressure of 3 mmHg. Shunts: There is no evidence of an atrial septal defect. +-------------+--------++  AORTIC VALVE             +-------------+--------++  AV Mean Grad: 3.0 mmHg   +-------------+--------++ +-------------+--------++  MITRAL VALVE             +-------------+--------++  MV Mean grad: 1.0 mmHg   +-------------+--------++  Annye Asa MD Electronically signed by Annye Asa MD Signature Date/Time: 05/15/2021/6:49:35 PM    Final    VAS US DOPPLER PRE CABG  Result Date: 05/19/2021 PREOPERATIVE VASCULAR EVALUATION Patient Name:  Randall Hodges  Date of Exam:   04/29/2021 Medical Rec #: [id number]        Accession #:    [id number] Date of Birth: [birth year]        Patient Gender: M Patient Age:   89 years Exam Location:  [facility] Procedure:      VAS US DOPPLER PRE CABG Referring Phys: Collier Salina VANTRIGT --------------------------------------------------------------------------------  Indications:      Pre-CABG. Risk Factors:     Coronary artery disease. Comparison Study: No prior study Performing Technologist: Maudry Mayhew MHA, RDMS, RVT, RDCS  Examination Guidelines: A complete evaluation includes B-mode imaging, spectral Doppler, color Doppler, and power Doppler as needed of all accessible portions of each vessel. Bilateral testing is considered an integral part of a complete examination. Limited examinations for reoccurring indications may be performed as  noted.  Right Carotid Findings: +----------+--------+--------+--------+----------------------+--------+             PSV cm/s EDV cm/s Stenosis Describe               Comments  +----------+--------+--------+--------+----------------------+--------+  CCA Prox   106      20                                                 +----------+--------+--------+--------+----------------------+--------+  CCA Distal 87  19                                                 +----------+--------+--------+--------+----------------------+--------+  ICA Prox   53       17                smooth and hyperechoic           +----------+--------+--------+--------+----------------------+--------+  ICA Distal 111      30                                                 +----------+--------+--------+--------+----------------------+--------+  ECA        73       11                                                 +----------+--------+--------+--------+----------------------+--------+ +----------+--------+-------+----------------+------------+             PSV cm/s EDV cms Describe         Arm Pressure  +----------+--------+-------+----------------+------------+  Subclavian 134              Multiphasic, WNL               +----------+--------+-------+----------------+------------+ +---------+--------+--+--------+--+---------+  Vertebral PSV cm/s 61 EDV cm/s 16 Antegrade  +---------+--------+--+--------+--+---------+ Left Carotid Findings: +----------+--------+--------+--------+----------------------+--------+             PSV cm/s EDV cm/s Stenosis Describe               Comments  +----------+--------+--------+--------+----------------------+--------+  CCA Prox   95       21                                                 +----------+--------+--------+--------+----------------------+--------+  CCA Distal 75       21                                                 +----------+--------+--------+--------+----------------------+--------+  ICA Prox   69        27                focal and heterogenous           +----------+--------+--------+--------+----------------------+--------+  ICA Distal 65       29                                                 +----------+--------+--------+--------+----------------------+--------+  ECA        76       11                                                 +----------+--------+--------+--------+----------------------+--------+ +----------+--------+--------+----------------+------------+  Subclavian PSV cm/s EDV cm/s Describe         Arm Pressure  +----------+--------+--------+----------------+------------+             171               Multiphasic, WNL               +----------+--------+--------+----------------+------------+ +---------+--------+--+--------+--+---------+  Vertebral PSV cm/s 40 EDV cm/s 11 Antegrade  +---------+--------+--+--------+--+---------+  ABI Findings: +--------+------------------+-----+---------+--------+  Right    Rt Pressure (mmHg) Index Waveform  Comment   +--------+------------------+-----+---------+--------+  Brachial 126                      triphasic           +--------+------------------+-----+---------+--------+  PTA      151                1.20  triphasic           +--------+------------------+-----+---------+--------+  DP       145                1.15  triphasic           +--------+------------------+-----+---------+--------+ +--------+------------------+-----+---------+-------+  Left     Lt Pressure (mmHg) Index Waveform  Comment  +--------+------------------+-----+---------+-------+  Brachial 121                      triphasic          +--------+------------------+-----+---------+-------+  PTA      161                1.28  triphasic          +--------+------------------+-----+---------+-------+  DP       156                1.24  triphasic          +--------+------------------+-----+---------+-------+  Right Doppler Findings: +--------+--------+-----+---------+--------+  Site      Pressure Index Doppler   Comments  +--------+--------+-----+---------+--------+  Brachial 126            triphasic           +--------+--------+-----+---------+--------+  Radial                  triphasic           +--------+--------+-----+---------+--------+  Ulnar                   triphasic           +--------+--------+-----+---------+--------+  Left Doppler Findings: +--------+--------+-----+---------+--------+  Site     Pressure Index Doppler   Comments  +--------+--------+-----+---------+--------+  Brachial 121            triphasic           +--------+--------+-----+---------+--------+  Radial                  triphasic           +--------+--------+-----+---------+--------+  Ulnar                   triphasic           +--------+--------+-----+---------+--------+  Summary: Right Carotid: The extracranial vessels were near-normal with only minimal wall                thickening or plaque. Left Carotid: The extracranial vessels were near-normal with only minimal wall  thickening or plaque. Vertebrals:  Bilateral vertebral arteries demonstrate antegrade flow. Subclavians: Normal flow hemodynamics were seen in bilateral subclavian              arteries. Right ABI: Resting right ankle-brachial index is within normal range. No evidence of significant right lower extremity arterial disease. Left ABI: Resting left ankle-brachial index is within normal range. No evidence of significant left lower extremity arterial disease. Right Upper Extremity: Doppler waveforms decrease 50% with right radial compression. Doppler waveforms remain within normal limits with right ulnar compression. Left Upper Extremity: Doppler waveform obliterate with left radial compression. Doppler waveforms remain within normal limits with left ulnar compression.  Electronically signed by Deitra Mayo MD on 04/29/2021 at 6:21:14 AM.    Final    ECHOCARDIOGRAM LIMITED  Result Date: 05/13/2021    ECHOCARDIOGRAM LIMITED REPORT    Patient Name:   Randall Hodges Date of Exam: 05/13/2021 Medical Rec #:  [id number]       Height:       70.0 in Accession #:    [id number]      Weight:       191.4 lb Date of Birth:  25-Dec-1950       BSA:          2.049 m Patient Age:    70 years        BP:           115/74 mmHg Patient Gender: M               HR:           82 bpm. Exam Location:  Inpatient Procedure: 2D Echo and Limited Echo Indications:    Pericardial Effusion  History:        Patient has prior history of Echocardiogram examinations, most                 recent 08/21/2020. CAD.  Sonographer:    Jefferey Pica Referring Phys: [phone] PETER VANTRIGT  Sonographer Comments: Could not obtain images from subcostal window due to dressings. IMPRESSIONS  1. Left ventricular ejection fraction, by estimation, is 45%. The left ventricle demonstrates global hypokinesis. Prominent septal bounce, this looks respirophasic.  2. No evidence of mitral stenosis.  3. The aortic valve is tricuspid. Aortic valve sclerosis/calcification is present, without any evidence of aortic stenosis.  4. Aortic dilatation noted. There is mild dilatation of the ascending aorta, measuring 37 mm.  5. Small circumferential pericardial effusion with some organization. The IVC was not visualized. There is respirophasic variation of the interventricular septum. I do not think tamponade is present, but there may be some pericardial restriction creating the septal findings => possible early effusive/constrictive picture.  6. Limited echo. FINDINGS  Left Ventricle: Left ventricular ejection fraction, by estimation, is 45%. The left ventricle demonstrates global hypokinesis. The left ventricular internal cavity size was normal in size. There is no left ventricular hypertrophy. Left Atrium: Left atrial size was normal in size. Right Atrium: Right atrial size was normal in size. Mitral Valve: There is mild calcification of the mitral valve leaflet(s). No evidence of mitral valve stenosis. Aortic  Valve: The aortic valve is tricuspid. Aortic valve sclerosis/calcification is present, without any evidence of aortic stenosis. Aorta: Aortic dilatation noted. There is mild dilatation of the ascending aorta, measuring 37 mm. Venous: The inferior vena cava was not well visualized. IAS/Shunts: No atrial level shunt detected by color flow Doppler. LEFT VENTRICLE PLAX 2D LVIDd:  4.60 cm LVIDs:         4.10 cm LV PW:         1.10 cm LV IVS:        1.00 cm LVOT diam:     2.10 cm LVOT Area:     3.46 cm  LV Volumes (MOD) LV vol d, MOD A4C: 116.0 ml LV vol s, MOD A4C: 78.6 ml LV SV MOD A4C:     116.0 ml LEFT ATRIUM             Index        RIGHT ATRIUM           Index LA diam:        3.20 cm 1.56 cm/m   RA Area:     16.70 cm LA Vol (A2C):   56.4 ml 27.53 ml/m  RA Volume:   43.90 ml  21.43 ml/m LA Vol (A4C):   51.5 ml 25.14 ml/m LA Biplane Vol: 56.0 ml 27.34 ml/m   AORTA Ao Root diam: 3.70 cm Ao Asc diam:  3.40 cm TRICUSPID VALVE TR Peak grad:   27.5 mmHg TR Vmax:        262.00 cm/s  SHUNTS Systemic Diam: 2.10 cm Dalton McleanMD Electronically signed by Franki Monte Signature Date/Time: 05/13/2021/3:57:31 PM    Final      Treatments: surgery  Operative Report    DATE OF PROCEDURE: 05/19/2021   PROCEDURES PERFORMED:   1.  Coronary artery bypass grafting x3 (left internal mammary artery to LAD, saphenous vein graft to obtuse marginal, saphenous vein graft to the distal posterolateral branch of the circumflex). 2.  Endoscopic harvest of right leg greater saphenous vein. 3.  Placement of right femoral A-line for blood pressure monitoring.   SURGEON:  Len Childs, MD.   ASSISTANT:  Jadene Pierini, PA-C.  Discharge Exam: Blood pressure (!) 70/48, pulse 87, temperature (!) 97.5 F (36.4 C), temperature source Oral, resp. rate (!) 22, height 5\' 10"  (1.778 m), weight 81.1 kg, SpO2 100 %. {physical [id number]   Discharge Medications:  The patient has been discharged on:   1.Beta  Blocker:  Yes [ x  ]                              No   [   ]                              If No, reason:  2.Ace Inhibitor/ARB: Yes [   ]                                     No  [ x ]                                     If No, reason: Adjusting beta-blocker for rhythm control. May be started as outpatient  3.Statin:   Yes [ x  ]                  No  [   ]                  If No, reason:  4.Ecasa:  Yes  [ x  ]  No   [   ]                  If No, reason:  Patient had ACS upon admission:  Plavix/P2Y12 inhibitor: Yes [   ]                                      No  [  x ]  On Eliquis for paroxysmal atrial fibrillation     Discharge Instructions     AMB Referral to Cardiac Rehabilitation - Phase II   Complete by: As directed    Diagnosis: CABG   CABG X ___: 3   After initial evaluation and assessments completed: Virtual Based Care may be provided alone or in conjunction with Phase 2 Cardiac Rehab based on patient barriers.: Yes      Allergies as of 05/17/2021       Reactions   Amoxicillin-pot Clavulanate    Per pt report on 08/19/20, makes his urine dark colored   Atorvastatin Other (See Comments)    ( pt states causing runny nose, headaches, issue w/ urination)   Plant Derived Enzymes    Other reaction(s): Unknown   Trichophyton Other (See Comments)     Med Rec must be completed prior to using this [place]***       Follow-up Information     Dahlia Byes, MD. Go on 06/01/2021.   Specialty: Cardiothoracic Surgery Why: [address]                       [place] [address] Your appointment is a 3:30pm on Tuesday 06/01/21.  On the day you see your surgeon obtain a chest x-ray at [facility] which is located in the same office complex on the first floor.  Please obtain the x-ray 1/2-hour prior to the appointment with surgeon.        Warren Lacy, PA-C. Go on 05/28/2021.   Specialty: Internal Medicine Why: Your  appointment is at 8:25am Contact information: [place] [address] [phone]               The patient has been discharged on:   1.Beta Blocker:  Yes [   ]                              No   [   ]                              If No, reason:  2.Ace Inhibitor/ARB: Yes [   ]                                     No  [    ]                                     If No, reason:  3.Statin:   Yes [   ]                  No  [   ]  If No, reason:  4.Ecasa:  Yes  [   ]                  No   [   ]                  If No, reason:    Signed: Erin Barrett, PA-C  05/17/2021, 1:12 PM

## 2021-05-08 NOTE — Progress Notes (Addendum)
[address]       [place] [address]             [phone]      2 Days Post-Op Procedure(s) (LRB): CORONARY ARTERY BYPASS GRAFTING (CABG) x3 on pump using left internal mammary artery and right endoscopic greater saphenous vein conduit. (N/A) TRANSESOPHAGEAL ECHOCARDIOGRAM (TEE) (N/A) APPLICATION OF CELL SAVER ENDOVEIN HARVEST OF GREATER SAPHENOUS VEIN (Right) Subjective: Feeling better overall  Objective: Vital signs in last 24 hours: Temp:  [96.6 F (35.9 C)-98.8 F (37.1 C)] 97 F (36.1 C) (01/20 1114) Pulse Rate:  [66-147] 74 (01/20 1200) Cardiac Rhythm: Normal sinus rhythm (01/20 1200) Resp:  [5-30] 11 (01/20 1200) BP: (87-143)/(59-101) 131/61 (01/20 1200) SpO2:  [85 %-97 %] 93 % (01/20 1200) Arterial Line BP: (92-203)/(33-88) 132/54 (01/20 1200) Weight:  [89 kg] 89 kg (01/20 0500)  Hemodynamic parameters for last 24 hours: PAP: (20-56)/(-9-33) 46/-9 CVP:  [0 mmHg-39 mmHg] 8 mmHg CO:  [4.3 L/min-5.7 L/min] 5.6 L/min CI:  [2.2 L/min/m2-2.9 L/min/m2] 2.9 L/min/m2  Intake/Output from previous day: 01/19 0701 - 01/20 0700 In: 2533.9 [P.O.:490; I.V.:1743.8; IV Piggyback:300.1] Out: 1670 [Urine:1050; Chest Tube:620] Intake/Output this shift: Total I/O In: 667.8 [P.O.:175; I.V.:492.8] Out: 480 [Urine:300; Chest Tube:180]  General appearance: alert, cooperative, and no distress Heart: regular rate and rhythm Lungs: min dim in bases  Abdomen: benign Extremities: some edema hands and feet Wound: dressings clean  Lab Results: Recent Labs    05/07/21 1710 05/07/21 2316 05/08/21 0325 05/08/21 1407  WBC 14.8*  --  15.9*  --   HGB 10.0*   < > 10.1* 10.5*  HCT 29.9*   < > 29.9* 31.0*  PLT PLATELET CLUMPS NOTED ON SMEAR, UNABLE TO ESTIMATE  --  90*  --    < > = values in this interval not displayed.   BMET:  Recent Labs    05/07/21 1710 05/07/21 2316 05/08/21 0325 05/08/21 1407  NA 133*   < > 130* 130*  K 4.5   < > 4.3 3.9  CL 103   --  100 95*  CO2 20*  --  21*  --   GLUCOSE 136*  --  201* 133*  BUN 10  --  14 18  CREATININE 1.10  --  1.15 1.00  CALCIUM 8.0*  --  8.0*  --    < > = values in this interval not displayed.    PT/INR:  Recent Labs    04/23/2021 1715  LABPROT 18.0*  INR 1.5*   ABG    Component Value Date/Time   PHART 7.347 (L) 05/08/2021 0323   HCO3 22.7 05/08/2021 0323   TCO2 22 05/08/2021 1407   ACIDBASEDEF 3.0 (H) 05/08/2021 0323   O2SAT 50.9 05/08/2021 0325   CBG (last 3)  Recent Labs    05/08/21 0320 05/08/21 0758 05/08/21 1142  GLUCAP 203* 159* 140*    Meds Scheduled Meds:  acetaminophen  1,000 mg Oral Q6H   Or   acetaminophen (TYLENOL) oral liquid 160 mg/5 mL  1,000 mg Per Tube Q6H   aspirin EC  325 mg Oral Daily   Or   aspirin  324 mg Per Tube Daily   atorvastatin  80 mg Oral Daily   bisacodyl  10 mg Oral Daily   Or   bisacodyl  10 mg Rectal Daily   chlorhexidine  15 mL Mouth Rinse BID   Chlorhexidine Gluconate Cloth  6 each Topical Daily  Chlorhexidine Gluconate Cloth  6 each Topical Daily   docusate sodium  200 mg Oral Daily   furosemide  20 mg Intravenous BID   insulin aspart  0-24 Units Subcutaneous Q4H   insulin detemir  8 Units Subcutaneous BID   mouth rinse  15 mL Mouth Rinse q12n4p   metoprolol tartrate  12.5 mg Oral BID   Or   metoprolol tartrate  12.5 mg Per Tube BID   pantoprazole  40 mg Oral Daily   sodium chloride flush  10-40 mL Intracatheter Q12H   sodium chloride flush  3 mL Intravenous Q12H   Continuous Infusions:  sodium chloride Stopped (05/08/21 1108)   sodium chloride     sodium chloride     amiodarone 30 mg/hr (05/08/21 1211)   epinephrine 2 mcg/min (05/08/21 1200)   lactated ringers     lactated ringers 20 mL/hr at 05/08/21 1200   lidocaine 1 mg/min (05/08/21 1200)   milrinone 0.25 mcg/kg/min (05/08/21 1421)   norepinephrine (LEVOPHED) Adult infusion 0 mcg/min (05/08/21 0847)   PRN Meds:.sodium chloride, metoprolol tartrate,  morphine injection, ondansetron (ZOFRAN) IV, oxyCODONE, sodium chloride flush, sodium chloride flush, traMADol  Xrays DG Chest Port 1 View  Result Date: 05/08/2021 CLINICAL DATA:  Post CABG, chest tube present EXAM: PORTABLE CHEST 1 VIEW COMPARISON:  05/07/2021 FINDINGS: Endotracheal and enteric tubes are no longer present. Right IJ Swan-Ganz catheter is unchanged. Bilateral chest tubes remain present. No pneumothorax. Probable small pleural effusions with adjacent atelectasis. Chronic interstitial changes. Similar cardiomediastinal contours. IMPRESSION: Lines and tubes as above.  No pneumothorax. Probable small pleural diffusion with adjacent atelectasis superimposed on chronic changes. Electronically Signed   By: Macy Mis M.D.   On: 05/08/2021 08:48   DG Chest Port 1 View  Result Date: 05/07/2021 CLINICAL DATA:  Postop EXAM: PORTABLE CHEST 1 VIEW COMPARISON:  Chest x-ray dated May 06, 2021 FINDINGS: Unchanged position of ET tube, PA catheter and bilateral chest tubes. Enteric tube partially seen coursing below the diaphragm. Cardiac and mediastinal contours are unchanged status post median sternotomy and CABG. Unchanged bilateral reticular opacities. No large pleural effusion or evidence of pneumothorax. IMPRESSION: 1. Stable support devices. 2. No new parenchymal opacity. Electronically Signed   By: Yetta Glassman M.D.   On: 05/07/2021 09:05   DG Chest Port 1 View  Result Date: 05/04/2021 CLINICAL DATA:  Status post CABG, surgery follow-up. EXAM: PORTABLE CHEST 1 VIEW COMPARISON:  Chest x-ray 04/28/2021. FINDINGS: Patient is status post new cardiac surgery. There is enlargement of the cardiomediastinal silhouette, likely postsurgical. Endotracheal tube tip is 4.9 cm above the carina. Swan-Ganz catheter tip projects over the main pulmonary artery. Bilateral drainage catheters are in place. Enteric tube tip ends in the mid stomach. Scattered chronic appearing interstitial opacities  throughout both lungs appear stable. There is no superimposed airspace consolidation, pleural effusion or pneumothorax. No acute fractures are seen. IMPRESSION: 1. New lines and tubes as above. 2. Status post cardiac surgery. Enlargement of the cardiomediastinal silhouette is likely postsurgical. 3. Stable scattered chronic appearing interstitial opacities. Electronically Signed   By: Ronney Asters M.D.   On: 05/17/2021 17:15    Assessment/Plan: S/P Procedure(s) (LRB): CORONARY ARTERY BYPASS GRAFTING (CABG) x3 on pump using left internal mammary artery and right endoscopic greater saphenous vein conduit. (N/A) TRANSESOPHAGEAL ECHOCARDIOGRAM (TEE) (N/A) APPLICATION OF CELL SAVER ENDOVEIN HARVEST OF GREATER SAPHENOUS VEIN (Right)  POD#2  1 afeb, VSS, s BP mostly in 120-130's. On milrinone , epi, amio, and lidocaine- had rapid  afib last pm as well as NSVT episodes- wean as able, good cardiac output 2 sats ok on 6 liters 3 CT with mod drainage 680 cc yesterday 3 fair UOP- got 20 of lasix this am 4 normal renal fxn 5 anemia is pretty stable 6 CXR atx/effus- minor 7 push rehab and pulm hygiene    LOS: 3 days    John Giovanni PA-C Pager [phone] 05/08/2021   OOB to chair, swan out and drips weaned,. except low dose milrinone Will DC chest drains and aline tomorrow Cont iv amiodarone, transition to po tomorrow Will ask for PT due to weakness  patient examined and medical record reviewed,agree with above note. Dahlia Byes 05/08/2021

## 2021-05-08 NOTE — H&P (Addendum)
Cardiology Admission History and Physical:   Critical Care time: 45 minutes   Patient ID: Randall Hodges MRN: [id number]; DOB: [birth year]   Admission date: 05/01/2021  PCP:  [name], MD   [facility] Providers Cardiologist:  [name], MD  Electrophysiologist:  Constance Haw, MD        Chief Complaint: Prolonged chest pain with phasic EKG changes  Patient Profile:   Randall Hodges is a 71 y.o. male with history of PVCs who is being seen 05/08/2021 for the evaluation of prolonged chest pain over the past 24 hours off and on..  History of Present Illness:   Mr. Randall Hodges he has a pre-existing history of premature ventricular contractions for which she has seen [name].  He saw Dr. Percival Spanish on 04/30/2021 because of chest discomfort over the past month.  He describes the discomfort as a soreness that is there most of the time.  He states that taking a deep breath accentuates the discomfort.  It became more severe at around 3 AM and was more intense leading to the decision to be seen in the emergency room.  The plan after seeing Dr. Percival Spanish was to perform coronary CTA.  Despite 3 hours of continuous discomfort, initial troponin I is 3.  2 brothers have coronary disease.  Father had coronary disease.  At least one of his brothers had a stent.  He has history of hyperlipidemia.  Stress myocardial perfusion study in May 2022 demonstrated EF of 41% with low risk perfusion pattern.  Echo demonstrated EF 65% May 2022.  Noncardiac CT performed in February 2019 revealed three-vessel coronary artery calcification.  Past Medical History:  Diagnosis Date   Allergic rhinitis    Prostate cancer [facility])     Past Surgical History:  Procedure Laterality Date   CORONARY ARTERY BYPASS GRAFT N/A 05/08/2021   Procedure: CORONARY ARTERY BYPASS GRAFTING (CABG) x3 on pump using left internal mammary artery and right endoscopic greater saphenous vein conduit.;  Surgeon:  Dahlia Byes, MD;  Location: [place];  Service: Open Heart Surgery;  Laterality: N/A;   ENDOVEIN HARVEST OF GREATER SAPHENOUS VEIN Right 05/17/2021   Procedure: ENDOVEIN HARVEST OF GREATER SAPHENOUS VEIN;  Surgeon: Dahlia Byes, MD;  Location: [place];  Service: Open Heart Surgery;  Laterality: Right;   LEFT HEART CATH AND CORONARY ANGIOGRAPHY N/A 05/14/2021   Procedure: LEFT HEART CATH AND CORONARY ANGIOGRAPHY;  Surgeon: Belva Crome, MD;  Location: [place] CV LAB;  Service: Cardiovascular;  Laterality: N/A;   PROSTATE BIOPSY     SHOULDER SURGERY     TEE WITHOUT CARDIOVERSION N/A 05/11/2021   Procedure: TRANSESOPHAGEAL ECHOCARDIOGRAM (TEE);  Surgeon: Dahlia Byes, MD;  Location: [place];  Service: Open Heart Surgery;  Laterality: N/A;     Medications Prior to Admission: Prior to Admission medications   Medication Sig Start Date End Date Taking? Authorizing Provider  albuterol (VENTOLIN HFA) 108 (90 Base) MCG/ACT inhaler SMARTSIG:1-2 Puff(s) Via Inhaler Every 4 Hours PRN 04/07/21   [provider]  aspirin EC 81 MG tablet Take 1 tablet (81 mg total) by mouth daily. Swallow whole. 07/18/20   [name], MD  cetirizine (ZYRTEC) 10 MG tablet Take 10 mg by mouth daily.    [provider]  metoprolol tartrate (LOPRESSOR) 100 MG tablet Take 1 tablet (100 mg total) by mouth 2 (two) times daily. 04/30/21 07/29/21  Minus Breeding, MD  omeprazole (PRILOSEC) 40 MG capsule Take 40 mg by mouth as needed.  03/09/18   [provider]  rosuvastatin (CRESTOR) 10 MG tablet Take 1 tablet (10 mg total) by mouth daily. 12/11/20 04/30/21  Leonie Man, MD     Allergies:    Allergies  Allergen Reactions   Amoxicillin-Pot Clavulanate     Per pt report on 08/19/20, makes his urine dark colored   Atorvastatin Other (See Comments)     ( pt states causing runny nose, headaches, issue w/ urination)   Plant Derived Enzymes     Other reaction(s): Unknown   Trichophyton Other (See  Comments)    Social History:   Social History   Socioeconomic History   Marital status: Married    Spouse name: Not on file   Number of children: 2   Years of education: Not on file   Highest education level: Not on file  Occupational History    Comment: full time   Tobacco Use   Smoking status: Never   Smokeless tobacco: Never  Vaping Use   Vaping Use: Never used  Substance and Sexual Activity   Alcohol use: Yes    Comment: occ   Drug use: Never   Sexual activity: Not on file  Other Topics Concern   Not on file  Social History Narrative   Not on file   Social Determinants of Health   Financial Resource Strain: Not on file  Food Insecurity: Not on file  Transportation Needs: Not on file  Physical Activity: Not on file  Stress: Not on file  Social Connections: Not on file  Intimate Partner Violence: Not on file    Family History: Father and 2 brothers have heart disease.  1 brother had coronary bypass surgery. The patient's family history includes Diabetes in his father; Hypertension in his paternal grandmother; Prostate cancer (age of onset: 20) in his father; Prostate cancer (age of onset: 34) in his brother. There is no history of Breast cancer, Colon cancer, or Pancreatic cancer.    ROS:  Please see the history of present illness.  No history of lung disease.  Does not smoke.  Denies dyspnea.  All other ROS reviewed and negative.     Physical Exam/Data:   Vitals:   05/08/21 1630 05/08/21 1645 05/08/21 1700 05/08/21 1715  BP:   (!) 148/73   Pulse: 80 80 81 78  Resp: 18 19 (!) 29 16  Temp:      TempSrc:      SpO2: 94% 94% 95% 96%  Weight:      Height:        Intake/Output Summary (Last 24 hours) at 05/08/2021 1729 Last data filed at 05/08/2021 1700 Gross per 24 hour  Intake 3337.44 ml  Output 1490 ml  Net 1847.44 ml   Last 3 Weights 05/08/2021 05/07/2021 05/19/2021  Weight (lbs) 196 lb 3.4 oz 200 lb 13.4 oz 171 lb 1.2 oz  Weight (kg) 89 kg 91.1 kg 77.6  kg     Body mass index is 28.15 kg/m.  General:  Well nourished, well developed, in no acute distress HEENT: normal Neck: no JVD Vascular: No carotid bruits; Distal pulses 2+ bilaterally   Cardiac:  normal S1, S2; RRR; no murmur. Lungs:  clear to auscultation bilaterally, no wheezing, rhonchi or rales  Abd: soft, nontender, no hepatomegaly  Ext: no edema Musculoskeletal:  No deformities, BUE and BLE strength normal and equal Skin: warm and dry  Neuro:  CNs 2-12 intact, no focal abnormalities noted Psych:  Normal affect    EKG:  The ECG that was done at 1:28 PM demonstrates ischemic inferior ST depression with mild ST elevation in lead I and aVL.  PVCs are noted.  Personally reviewed the EKG.  In comparison to the prior EKG the ST segment abnormality is new.  Relevant CV Studies:  2D Doppler echocardiogram 08/21/2020: IMPRESSIONS     1. Left ventricular ejection fraction, by estimation, is 60 to 65%. The  left ventricle has normal function. The left ventricle has no regional  wall motion abnormalities. Left ventricular diastolic parameters were  normal.   2. Right ventricular systolic function is normal. The right ventricular  size is normal. Tricuspid regurgitation signal is inadequate for assessing  PA pressure.   3. The mitral valve is normal in structure. Trivial mitral valve  regurgitation. No evidence of mitral stenosis.   4. The aortic valve is tricuspid. There is mild thickening of the aortic  valve. Aortic valve regurgitation is not visualized. No aortic stenosis is  present.   5. The inferior vena cava is normal in size with greater than 50%  respiratory variability, suggesting right atrial pressure of 3 mmHg.   Conclusion(s)/Recommendation(s): Frequent PVCs are seen during the study.   Laboratory Data:  High Sensitivity Troponin:   Recent Labs  Lab 04/19/2021 1321 05/07/2021 1518  TROPONINIHS 3 4       Chemistry Recent Labs  Lab 05/07/21 0314 05/07/21 0559  05/07/21 1710 05/07/21 2316 05/08/21 0325 05/08/21 1400 05/08/21 1407  NA 135   < > 133*   < > 130* 127* 130*  K 4.1   < > 4.5   < > 4.3 4.0 3.9  CL 108  --  103  --  100 97* 95*  CO2 21*  --  20*  --  21* 22  --   GLUCOSE 149*  --  136*  --  201* 135* 133*  BUN 10  --  10  --  14 15 18   CREATININE 1.10  --  1.10  --  1.15 1.20 1.00  CALCIUM 7.6*  --  8.0*  --  8.0* 8.0*  --   MG 2.6*  --  2.2  --   --   --   --   GFRNONAA >60  --  >60  --  >60 >60  --   ANIONGAP 6  --  10  --  9 8  --    < > = values in this interval not displayed.     Recent Labs  Lab 05/08/21 1400  PROT 5.3*  ALBUMIN 3.0*  AST 154*  ALT 27  ALKPHOS 40  BILITOT 0.7   Lipids No results for input(s): CHOL, TRIG, HDL, LABVLDL, LDLCALC, CHOLHDL in the last 168 hours. Hematology Recent Labs  Lab 05/07/21 1710 05/07/21 2316 05/08/21 0325 05/08/21 1407  WBC 14.8*  --  15.9*  --   RBC 3.36*  --  3.30*  --   HGB 10.0*   < > 10.1* 10.5*  HCT 29.9*   < > 29.9* 31.0*  MCV 89.0  --  90.6  --   MCH 29.8  --  30.6  --   MCHC 33.4  --  33.8  --   RDW 14.3  --  14.7  --   PLT PLATELET CLUMPS NOTED ON SMEAR, UNABLE TO ESTIMATE  --  90*  --    < > = values in this interval not displayed.    Thyroid No results for input(s): TSH, FREET4 in the  last 168 hours. BNPNo results for input(s): BNP, PROBNP in the last 168 hours.  DDimer No results for input(s): DDIMER in the last 168 hours.   Radiology/Studies:  DG Chest Port 1 View  Result Date: 05/08/2021 CLINICAL DATA:  Post CABG, chest tube present EXAM: PORTABLE CHEST 1 VIEW COMPARISON:  05/07/2021 FINDINGS: Endotracheal and enteric tubes are no longer present. Right IJ Swan-Ganz catheter is unchanged. Bilateral chest tubes remain present. No pneumothorax. Probable small pleural effusions with adjacent atelectasis. Chronic interstitial changes. Similar cardiomediastinal contours. IMPRESSION: Lines and tubes as above.  No pneumothorax. Probable small pleural  diffusion with adjacent atelectasis superimposed on chronic changes. Electronically Signed   By: Macy Mis M.D.   On: 05/08/2021 08:48     Assessment and Plan:   Acute coronary syndrome: with ongoing chest pain which has ambiguous features, normal initial troponin despite greater than 3 hours of chest discomfort, and ischemic appearing EKG changes inferiorly and lateral 1 EKG. Hyperlipidemia: Family history CAD  Recommendation to the patient after examination in the emergency room was to perform urgent coronary angiography, define anatomy, and allow anatomy to guide therapy.  The procedure including the risk of stroke, death, bleeding, kidney injury, was discussed with the patient in detail.   Risk Assessment/Risk Scores:    TIMI Risk Score for Unstable Angina or Non-ST Elevation MI:   The patient's TIMI risk score is 5, which indicates a 26% risk of all cause mortality, new or recurrent myocardial infarction or need for urgent revascularization in the next 14 days.       Severity of Illness: The appropriate patient status for this patient is INPATIENT. Inpatient status is judged to be reasonable and necessary in order to provide the required intensity of service to ensure the patient's safety. The patient's presenting symptoms, physical exam findings, and initial radiographic and laboratory data in the context of their chronic comorbidities is felt to place them at high risk for further clinical deterioration. Furthermore, it is not anticipated that the patient will be medically stable for discharge from the hospital within 2 midnights of admission.   * I certify that at the point of admission it is my clinical judgment that the patient will require inpatient hospital care spanning beyond 2 midnights from the point of admission due to high intensity of service, high risk for further deterioration and high frequency of surveillance required.*   For questions or updates, please contact  [place] Please consult [email or web address] for contact info under     Signed, Sinclair Grooms, MD  05/14/2021 3:20 PM

## 2021-05-08 NOTE — Progress Notes (Signed)
Patient has been intermittently confused throughout the night, referencing things in the room that aren't there, reaching for items in the air, and very restless.   To the best of my ability, I clustered care to prioritize sleep for him- rest seems to improve his mentation though patient is very groggy.   He continues to complain of being very sore and it has been difficult managing his pain tonight. Attempted to practice IS with him and patient was able to achieve 500 after numerous attempts, but required much reinforcement- pain was limiting his deep breathing.

## 2021-05-09 ENCOUNTER — Inpatient Hospital Stay (HOSPITAL_COMMUNITY): Payer: PPO

## 2021-05-09 DIAGNOSIS — I249 Acute ischemic heart disease, unspecified: Secondary | ICD-10-CM

## 2021-05-09 LAB — BPAM RBC
Blood Product Expiration Date: 202302122359
Blood Product Expiration Date: 202302132359
Blood Product Expiration Date: 202302132359
Blood Product Expiration Date: 202302142359
ISSUE DATE / TIME: 202301190323
ISSUE DATE / TIME: 202301190540
Unit Type and Rh: 5100
Unit Type and Rh: 5100
Unit Type and Rh: 5100
Unit Type and Rh: 5100

## 2021-05-09 LAB — CBC
HCT: 26.4 % — ABNORMAL LOW (ref 39.0–52.0)
Hemoglobin: 9.1 g/dL — ABNORMAL LOW (ref 13.0–17.0)
MCH: 30.8 pg (ref 26.0–34.0)
MCHC: 34.5 g/dL (ref 30.0–36.0)
MCV: 89.5 fL (ref 80.0–100.0)
Platelets: 74 10*3/uL — ABNORMAL LOW (ref 150–400)
RBC: 2.95 MIL/uL — ABNORMAL LOW (ref 4.22–5.81)
RDW: 14.6 % (ref 11.5–15.5)
WBC: 12.4 10*3/uL — ABNORMAL HIGH (ref 4.0–10.5)
nRBC: 0 % (ref 0.0–0.2)

## 2021-05-09 LAB — TYPE AND SCREEN
ABO/RH(D): O POS
Antibody Screen: NEGATIVE
Unit division: 0
Unit division: 0
Unit division: 0
Unit division: 0

## 2021-05-09 LAB — BASIC METABOLIC PANEL
Anion gap: 5 (ref 5–15)
BUN: 18 mg/dL (ref 8–23)
CO2: 25 mmol/L (ref 22–32)
Calcium: 7.9 mg/dL — ABNORMAL LOW (ref 8.9–10.3)
Chloride: 96 mmol/L — ABNORMAL LOW (ref 98–111)
Creatinine, Ser: 0.98 mg/dL (ref 0.61–1.24)
GFR, Estimated: >60 mL/min (ref >60)
Glucose, Bld: 123 mg/dL — ABNORMAL HIGH (ref 70–99)
Potassium: 4.3 mmol/L (ref 3.5–5.1)
Sodium: 126 mmol/L — ABNORMAL LOW (ref 135–145)

## 2021-05-09 LAB — GLUCOSE, CAPILLARY
Glucose-Capillary: 113 mg/dL — ABNORMAL HIGH (ref 70–99)
Glucose-Capillary: 145 mg/dL — ABNORMAL HIGH (ref 70–99)
Glucose-Capillary: 128 mg/dL — ABNORMAL HIGH (ref 70–99)
Glucose-Capillary: 86 mg/dL (ref 70–99)
Glucose-Capillary: 76 mg/dL (ref 70–99)

## 2021-05-09 LAB — COOXEMETRY PANEL
Carboxyhemoglobin: 1.1 % (ref 0.5–1.5)
Methemoglobin: 0.9 % (ref 0.0–1.5)
O2 Saturation: 65 %
Total hemoglobin: 7 g/dL — ABNORMAL LOW (ref 12.0–16.0)

## 2021-05-09 MED ORDER — INSULIN ASPART 100 UNIT/ML IJ SOLN: 0.0000 [IU] | Freq: Every day | INTRAMUSCULAR | Status: DC

## 2021-05-09 MED ORDER — INSULIN ASPART 100 UNIT/ML IJ SOLN
0.0000 [IU] | Freq: Three times a day (TID) | INTRAMUSCULAR | Status: DC
  Administered 2021-05-09 – 2021-05-11 (×3): 2 [IU] via SUBCUTANEOUS
  Administered 2021-05-12: 13:00:00 3 [IU] via SUBCUTANEOUS

## 2021-05-09 MED ORDER — ROSUVASTATIN CALCIUM 5 MG PO TABS
10.0000 mg | ORAL_TABLET | Freq: Every day | ORAL | Status: DC
  Administered 2021-05-10 – 2021-05-17 (×8): 10 mg via ORAL
  Filled 2021-05-09 (×9): qty 2

## 2021-05-09 MED ORDER — FUROSEMIDE 10 MG/ML IJ SOLN
40.0000 mg | Freq: Two times a day (BID) | INTRAMUSCULAR | Status: DC
  Administered 2021-05-09 – 2021-05-11 (×3): 40 mg via INTRAVENOUS
  Filled 2021-05-09 (×3): qty 4

## 2021-05-09 MED ORDER — FUROSEMIDE 10 MG/ML IJ SOLN
INTRAMUSCULAR | Status: AC
  Filled 2021-05-09: qty 2

## 2021-05-09 MED ORDER — MILRINONE LACTATE IN DEXTROSE 20-5 MG/100ML-% IV SOLN: 0.1250 ug/kg/min | INTRAVENOUS | Status: DC

## 2021-05-09 MED ORDER — AMIODARONE HCL 200 MG PO TABS
400.0000 mg | ORAL_TABLET | Freq: Two times a day (BID) | ORAL | Status: DC
  Administered 2021-05-09 – 2021-05-11 (×5): 400 mg via ORAL
  Filled 2021-05-09 (×5): qty 2

## 2021-05-09 MED ORDER — METOPROLOL TARTRATE 25 MG/10 ML ORAL SUSPENSION
25.0000 mg | Freq: Two times a day (BID) | ORAL | Status: DC
  Filled 2021-05-09: qty 10

## 2021-05-09 MED ORDER — METOPROLOL TARTRATE 25 MG PO TABS
25.0000 mg | ORAL_TABLET | Freq: Two times a day (BID) | ORAL | Status: DC
  Administered 2021-05-10 – 2021-05-15 (×12): 25 mg via ORAL
  Filled 2021-05-09 (×12): qty 1

## 2021-05-09 NOTE — Progress Notes (Signed)
3 Days Post-Op Procedure(s) (LRB): CORONARY ARTERY BYPASS GRAFTING (CABG) x3 on pump using left internal mammary artery and right endoscopic greater saphenous vein conduit. (N/A) TRANSESOPHAGEAL ECHOCARDIOGRAM (TEE) (N/A) APPLICATION OF CELL SAVER ENDOVEIN HARVEST OF GREATER SAPHENOUS VEIN (Right) Subjective: C/o pain, nausea  Objective: Vital signs in last 24 hours: Temp:  [96.6 F (35.9 C)-98.4 F (36.9 C)] 98.2 F (36.8 C) (01/21 0815) Pulse Rate:  [69-86] 70 (01/21 0800) Cardiac Rhythm: Normal sinus rhythm (01/21 0800) Resp:  [7-29] 18 (01/21 0800) BP: (103-156)/(61-79) 118/67 (01/21 0800) SpO2:  [86 %-100 %] 98 % (01/21 0800) Arterial Line BP: (93-168)/(42-93) 141/73 (01/21 0800) Weight:  [90 kg] 90 kg (01/21 0500)  Hemodynamic parameters for last 24 hours: PAP: (25-46)/(-9-15) 46/-9 CVP:  [8 mmHg-39 mmHg] 8 mmHg CO:  [5.6 L/min] 5.6 L/min CI:  [2.9 L/min/m2] 2.9 L/min/m2  Intake/Output from previous day: 01/20 0701 - 01/21 0700 In: 1826.2 [P.O.:500; I.V.:1326.2] Out: 1940 [Urine:1560; Chest Tube:380] Intake/Output this shift: Total I/O In: 81.3 [I.V.:81.3] Out: -   General appearance: alert, cooperative, and no distress Neurologic: intact Heart: regular rate and rhythm Lungs: diminished breath sounds bibasilar Abdomen: soft, nontender  Lab Results: Recent Labs    05/08/21 0325 05/08/21 1407 05/09/21 0346  WBC 15.9*  --  12.4*  HGB 10.1* 10.5* 9.1*  HCT 29.9* 31.0* 26.4*  PLT 90*  --  74*   BMET:  Recent Labs    05/08/21 1400 05/08/21 1407 05/09/21 0346  NA 127* 130* 126*  K 4.0 3.9 4.3  CL 97* 95* 96*  CO2 22  --  25  GLUCOSE 135* 133* 123*  BUN 15 18 18   CREATININE 1.20 1.00 0.98  CALCIUM 8.0*  --  7.9*    PT/INR:  Recent Labs    05/04/2021 1715  LABPROT 18.0*  INR 1.5*   ABG    Component Value Date/Time   PHART 7.347 (L) 05/08/2021 0323   HCO3 22.7 05/08/2021 0323   TCO2 22 05/08/2021 1407   ACIDBASEDEF 3.0 (H) 05/08/2021 0323    O2SAT 65.0 05/09/2021 0346   CBG (last 3)  Recent Labs    05/08/21 2317 05/09/21 0357 05/09/21 0812  GLUCAP 124* 113* 145*    Assessment/Plan: S/P Procedure(s) (LRB): CORONARY ARTERY BYPASS GRAFTING (CABG) x3 on pump using left internal mammary artery and right endoscopic greater saphenous vein conduit. (N/A) TRANSESOPHAGEAL ECHOCARDIOGRAM (TEE) (N/A) APPLICATION OF CELL SAVER ENDOVEIN HARVEST OF GREATER SAPHENOUS VEIN (Right) - NEURO- intact CV- in SR on amiodarone- change to PO  Co-ox 65- Decrease milrinone to 0.125  ASA, statin (change to rosuvastatin his home med), metoprolol  Dc  A line RESP- IS RENAL- creatinine normal  Fluid overload- diurese, increase Lasix to 40 BID ENDO- CBG mildly elevated  Change SSi to AC and HS GI- diet as tolerated Dc chest tubes Mobilize- PT consulted SCD for DVT prophylaxis Thrombocytopenia- PLT down slightly, no bleeding, no heparin, monitor    LOS: 4 days    Melrose Nakayama 05/09/2021

## 2021-05-09 NOTE — Evaluation (Signed)
Physical Therapy Evaluation Patient Details Name: Randall Hodges MRN: [id number] DOB: 1950-10-18 Today's Date: 05/09/2021  History of Present Illness  Pt is a 71 y.o. male admitted 05/19/2021 with severe chest pain. S/p emergent CABG x3 1/18. ETT 1/18-1/19. PMH includes prostate CA.   Clinical Impression  Pt presents with an overall decrease in functional mobility secondary to above. PTA, pt independent, active, lives with wife. Today, pt able to initiate transfer and gait training with min guard to modA for mobility. Educ re: sternal precautions, activity recommendations and importance of mobility. Expect pt to progress well with activity. Pt would benefit from continued acute PT services to maximize functional mobility and independence prior to d/c home.      Recommendations for follow up therapy are one component of a multi-disciplinary discharge planning process, led by the attending physician.  Recommendations may be updated based on patient status, additional functional criteria and insurance authorization.  Follow Up Recommendations No PT follow up (pending progression)    Assistance Recommended at Discharge Intermittent Supervision/Assistance  Patient can return home with the following  A little help with walking and/or transfers;A little help with bathing/dressing/bathroom;Assistance with cooking/housework;Assist for transportation;Help with stairs or ramp for entrance    Equipment Recommendations  (TBD)  Recommendations for Other Services       Functional Status Assessment Patient has had a recent decline in their functional status and demonstrates the ability to make significant improvements in function in a reasonable and predictable amount of time.     Precautions / Restrictions Precautions Precautions: Fall;Sternal Precaution Booklet Issued: No      Mobility  Bed Mobility Overal bed mobility: Needs Assistance Bed Mobility: Supine to Sit     Supine to sit: Min  assist, HOB elevated     General bed mobility comments: pt powering into long sitting with minA for trunk elevation, cues for sternal precautions when scooting hips to EOB; educ on log roll, will trial next session    Transfers Overall transfer level: Needs assistance Equipment used:  (eva walker) Transfers: Sit to/from Stand Sit to Stand: Mod assist           General transfer comment: cues for hands on/next to knees and momentum to power up, pt requiring modA for trunk elevation    Ambulation/Gait Ambulation/Gait assistance: Min guard, Min assist, +2 safety/equipment Gait Distance (Feet): 110 Feet Assistive device:  (eva walker) Gait Pattern/deviations: Step-through pattern, Decreased stride length, Drifts right/left Gait velocity: Decreased     General Gait Details: Slow, mildly unsteady gait with eva walker and intermittent minA for stability and walker management; noted pt sweating and palor, pt admits to feeling lightheaded with max verbal cues to share symptoms, prompted to have seated rest break; BP stable upon return to room  Stairs            Wheelchair Mobility    Modified Rankin (Stroke Patients Only)       Balance Overall balance assessment: Needs assistance Sitting-balance support: No upper extremity supported, Feet supported Sitting balance-Leahy Scale: Fair     Standing balance support: Bilateral upper extremity supported, During functional activity, Reliant on assistive device for balance Standing balance-Leahy Scale: Poor                               Pertinent Vitals/Pain Pain Assessment Pain Assessment: Faces Faces Pain Scale: Hurts little more Pain Location: sternal incision Pain Descriptors / Indicators: Sore Pain Intervention(s):  Monitored during session    Home Living Family/patient expects to be discharged to:: Private residence Living Arrangements: Spouse/significant other Available Help at Discharge:  Family;Available 24 hours/day Type of Home: House Home Access: Stairs to enter Entrance Stairs-Rails: Right Entrance Stairs-Number of Steps: 5   Home Layout: One level Home Equipment: Conservation officer, nature (2 wheels)      Prior Function Prior Level of Function : Independent/Modified Independent;Driving             Mobility Comments: Independent without DME; reports staying active with exercise and yardwork ADLs Comments: Independent without DME; wife does majority of housework     Hand Dominance        Extremity/Trunk Assessment   Upper Extremity Assessment Upper Extremity Assessment: Overall WFL for tasks assessed (within limits of sternal precautions)    Lower Extremity Assessment Lower Extremity Assessment: Generalized weakness       Communication   Communication: No difficulties  Cognition Arousal/Alertness: Awake/alert Behavior During Therapy: WFL for tasks assessed/performed Overall Cognitive Status: Within Functional Limits for tasks assessed                                 General Comments: WFL for simple tasks; not formally assessed. Question if pt masking some symptoms of lightheadedness, requiring max cues to relay this info during mobility        General Comments General comments (skin integrity, edema, etc.): Pt's son present and supportive. Post-ambulation BP 144/78, HR 70s, SpO2 93% on 5L O2. Initiated pt and son educ re: sternal precautions, activity recommendations (expectation to walk at least 3x/day; mobility specialists, cardiac rehab), importance of mobility, expected d/c needs    Exercises     Assessment/Plan    PT Assessment Patient needs continued PT services  PT Problem List Decreased strength;Decreased activity tolerance;Decreased balance;Decreased mobility;Decreased knowledge of use of DME;Decreased knowledge of precautions;Cardiopulmonary status limiting activity;Pain       PT Treatment Interventions DME instruction;Gait  training;Stair training;Functional mobility training;Therapeutic activities;Therapeutic exercise;Balance training;Patient/family education    PT Goals (Current goals can be found in the Care Plan section)  Acute Rehab PT Goals Patient Stated Goal: Return home PT Goal Formulation: With patient Time For Goal Achievement: 05/23/21 Potential to Achieve Goals: Good    Frequency Min 3X/week     Co-evaluation               AM-PAC PT "6 Clicks" Mobility  Outcome Measure Help needed turning from your back to your side while in a flat bed without using bedrails?: A Little Help needed moving from lying on your back to sitting on the side of a flat bed without using bedrails?: A Lot Help needed moving to and from a bed to a chair (including a wheelchair)?: A Lot Help needed standing up from a chair using your arms (e.g., wheelchair or bedside chair)?: A Lot Help needed to walk in hospital room?: A Little Help needed climbing 3-5 steps with a railing? : A Lot 6 Click Score: 14    End of Session Equipment Utilized During Treatment: Oxygen Activity Tolerance: Patient tolerated treatment well Patient left: in chair;with call bell/phone within reach;with family/visitor present Nurse Communication: Mobility status PT Visit Diagnosis: Other abnormalities of gait and mobility (R26.89);Muscle weakness (generalized) (M62.81)    Time: 0093-8182 PT Time Calculation (min) (ACUTE ONLY): 27 min   Charges:   PT Evaluation $PT Eval Moderate Complexity: 1 Mod PT Treatments $Therapeutic Activity:  8-22 mins       Mabeline Caras, [place], DPT Acute Rehabilitation Services  Pager [phone] Office [name] 05/09/2021, 4:09 PM

## 2021-05-09 NOTE — Progress Notes (Signed)
° °   °  [address]       [place] [address]             [phone]      POD # 3 CABG x 3  Up in chair Did well with PT earlier  BP 114/62    Pulse 64    Temp 98 F (36.7 C) (Oral)    Resp 18    Ht 5\' 10"  (1.778 m)    Wt 90 kg    SpO2 98%    BMI 28.47 kg/m   Intake/Output Summary (Last 24 hours) at 05/09/2021 1742 Last data filed at 05/09/2021 1200 Gross per 24 hour  Intake 841.05 ml  Output 1210 ml  Net -368.95 ml   Continue current Rx  Dashon Mcintire C. Roxan Hockey, MD [facility] [phone]

## 2021-05-10 ENCOUNTER — Inpatient Hospital Stay (HOSPITAL_COMMUNITY): Payer: PPO

## 2021-05-10 LAB — BASIC METABOLIC PANEL
Anion gap: 6 (ref 5–15)
BUN: 23 mg/dL (ref 8–23)
CO2: 28 mmol/L (ref 22–32)
Calcium: 8.1 mg/dL — ABNORMAL LOW (ref 8.9–10.3)
Chloride: 94 mmol/L — ABNORMAL LOW (ref 98–111)
Creatinine, Ser: 1 mg/dL (ref 0.61–1.24)
GFR, Estimated: >60 mL/min (ref >60)
Glucose, Bld: 76 mg/dL (ref 70–99)
Potassium: 4.1 mmol/L (ref 3.5–5.1)
Sodium: 128 mmol/L — ABNORMAL LOW (ref 135–145)

## 2021-05-10 LAB — CBC
HCT: 27.8 % — ABNORMAL LOW (ref 39.0–52.0)
Hemoglobin: 9.2 g/dL — ABNORMAL LOW (ref 13.0–17.0)
MCH: 30 pg (ref 26.0–34.0)
MCHC: 33.1 g/dL (ref 30.0–36.0)
MCV: 90.6 fL (ref 80.0–100.0)
Platelets: 94 10*3/uL — ABNORMAL LOW (ref 150–400)
RBC: 3.07 MIL/uL — ABNORMAL LOW (ref 4.22–5.81)
RDW: 14 % (ref 11.5–15.5)
WBC: 12.5 10*3/uL — ABNORMAL HIGH (ref 4.0–10.5)
nRBC: 0 % (ref 0.0–0.2)

## 2021-05-10 LAB — COOXEMETRY PANEL
Carboxyhemoglobin: 1.2 % (ref 0.5–1.5)
Carboxyhemoglobin: 1.2 % (ref 0.5–1.5)
Methemoglobin: 0.9 % (ref 0.0–1.5)
Methemoglobin: 0.7 % (ref 0.0–1.5)
O2 Saturation: 65.7 %
O2 Saturation: 49.9 %
Total hemoglobin: 9.4 g/dL — ABNORMAL LOW (ref 12.0–16.0)
Total hemoglobin: 9 g/dL — ABNORMAL LOW (ref 12.0–16.0)

## 2021-05-10 LAB — GLUCOSE, CAPILLARY
Glucose-Capillary: 103 mg/dL — ABNORMAL HIGH (ref 70–99)
Glucose-Capillary: 108 mg/dL — ABNORMAL HIGH (ref 70–99)
Glucose-Capillary: 113 mg/dL — ABNORMAL HIGH (ref 70–99)
Glucose-Capillary: 69 mg/dL — ABNORMAL LOW (ref 70–99)
Glucose-Capillary: 77 mg/dL (ref 70–99)
Glucose-Capillary: 93 mg/dL (ref 70–99)
Glucose-Capillary: 97 mg/dL (ref 70–99)

## 2021-05-10 MED ORDER — DEXTROSE 50 % IV SOLN
INTRAVENOUS | Status: AC
  Administered 2021-05-10: 25 mL
  Filled 2021-05-10: qty 50

## 2021-05-10 NOTE — Progress Notes (Signed)
° °   °  [address]       [place] [address]             [phone]      POD # 4  Up in chair  BP 120/65    Pulse 81    Temp 97.8 F (36.6 C) (Oral)    Resp (!) 22    Ht 5\' 10"  (1.778 m)    Wt 88.5 kg    SpO2 97%    BMI 27.99 kg/m   PM co-ox pending  Remo Lipps C. Roxan Hockey, MD [facility] [phone]

## 2021-05-10 NOTE — Progress Notes (Signed)
4 Days Post-Op Procedure(s) (LRB): CORONARY ARTERY BYPASS GRAFTING (CABG) x3 on pump using left internal mammary artery and right endoscopic greater saphenous vein conduit. (N/A) TRANSESOPHAGEAL ECHOCARDIOGRAM (TEE) (N/A) APPLICATION OF CELL SAVER ENDOVEIN HARVEST OF GREATER SAPHENOUS VEIN (Right) Subjective: C/o feeling weak/ tired, not much appetite but did have flatus this AM  Objective: Vital signs in last 24 hours: Temp:  [97.8 F (36.6 C)-98.4 F (36.9 C)] 97.8 F (36.6 C) (01/22 0700) Pulse Rate:  [64-73] 69 (01/22 0600) Cardiac Rhythm: Normal sinus rhythm (01/21 2000) Resp:  [0-25] 15 (01/22 0600) BP: (90-136)/(50-79) 120/65 (01/22 0600) SpO2:  [91 %-100 %] 97 % (01/22 0600) Arterial Line BP: (148)/(67) 148/67 (01/21 1000) Weight:  [88.5 kg] 88.5 kg (01/22 0600)  Hemodynamic parameters for last 24 hours: CVP:  [8 mmHg-9 mmHg] 9 mmHg  Intake/Output from previous day: 01/21 0701 - 01/22 0700 In: 912 [P.O.:240; I.V.:672] Out: 2280 [Urine:2280] Intake/Output this shift: Total I/O In: 133 [P.O.:120; I.V.:13] Out: 125 [Urine:125]  General appearance: alert, cooperative, and no distress Neurologic: intact Heart: regular rate and rhythm Lungs: diminished breath sounds bibasilar Abdomen: normal findings: soft, non-tender and + BS  Lab Results: Recent Labs    05/09/21 0346 05/10/21 0432  WBC 12.4* 12.5*  HGB 9.1* 9.2*  HCT 26.4* 27.8*  PLT 74* 94*   BMET:  Recent Labs    05/09/21 0346 05/10/21 0432  NA 126* 128*  K 4.3 4.1  CL 96* 94*  CO2 25 28  GLUCOSE 123* 76  BUN 18 23  CREATININE 0.98 1.00  CALCIUM 7.9* 8.1*    PT/INR: No results for input(s): LABPROT, INR in the last 72 hours. ABG    Component Value Date/Time   PHART 7.347 (L) 05/08/2021 0323   HCO3 22.7 05/08/2021 0323   TCO2 22 05/08/2021 1407   ACIDBASEDEF 3.0 (H) 05/08/2021 0323   O2SAT 65.7 05/10/2021 0432   CBG (last 3)  Recent Labs    05/10/21 0041 05/10/21 0337 05/10/21 0816   GLUCAP 93 77 113*    Assessment/Plan: S/P Procedure(s) (LRB): CORONARY ARTERY BYPASS GRAFTING (CABG) x3 on pump using left internal mammary artery and right endoscopic greater saphenous vein conduit. (N/A) TRANSESOPHAGEAL ECHOCARDIOGRAM (TEE) (N/A) APPLICATION OF CELL SAVER ENDOVEIN HARVEST OF GREATER SAPHENOUS VEIN (Right) POD # 4,progress remains slow but looks better than yesterday CV- in Sr with co-ox 65 on milrinone 0.125-  Dc milrinone RESP- continue IS RENAL- creatinine stable  Still fluid overloaded, continue diuresis ENDO- CBG low overmight- dc levemir GI- diet as tolerated Deconditioning- mobilize as tolerated Thrombocytopenia- PLT up to 94K   LOS: 5 days    Randall Hodges 05/10/2021

## 2021-05-11 ENCOUNTER — Inpatient Hospital Stay (HOSPITAL_COMMUNITY): Payer: PPO

## 2021-05-11 LAB — BASIC METABOLIC PANEL
Anion gap: 9 (ref 5–15)
BUN: 23 mg/dL (ref 8–23)
CO2: 27 mmol/L (ref 22–32)
Calcium: 8 mg/dL — ABNORMAL LOW (ref 8.9–10.3)
Chloride: 91 mmol/L — ABNORMAL LOW (ref 98–111)
Creatinine, Ser: 1.15 mg/dL (ref 0.61–1.24)
GFR, Estimated: >60 mL/min (ref >60)
Glucose, Bld: 111 mg/dL — ABNORMAL HIGH (ref 70–99)
Potassium: 4.2 mmol/L (ref 3.5–5.1)
Sodium: 127 mmol/L — ABNORMAL LOW (ref 135–145)

## 2021-05-11 LAB — HEMOGLOBIN A1C
Hgb A1c MFr Bld: 6.1 % — ABNORMAL HIGH (ref 4.8–5.6)
Mean Plasma Glucose: 128 mg/dL

## 2021-05-11 LAB — CBC
HCT: 27.7 % — ABNORMAL LOW (ref 39.0–52.0)
Hemoglobin: 9.4 g/dL — ABNORMAL LOW (ref 13.0–17.0)
MCH: 30.5 pg (ref 26.0–34.0)
MCHC: 33.9 g/dL (ref 30.0–36.0)
MCV: 89.9 fL (ref 80.0–100.0)
Platelets: 113 10*3/uL — ABNORMAL LOW (ref 150–400)
RBC: 3.08 MIL/uL — ABNORMAL LOW (ref 4.22–5.81)
RDW: 14 % (ref 11.5–15.5)
WBC: 12.6 10*3/uL — ABNORMAL HIGH (ref 4.0–10.5)
nRBC: 0 % (ref 0.0–0.2)

## 2021-05-11 LAB — COOXEMETRY PANEL
Carboxyhemoglobin: 1.5 % (ref 0.5–1.5)
Methemoglobin: 0.8 % (ref 0.0–1.5)
O2 Saturation: 46.7 %
Total hemoglobin: 14.7 g/dL (ref 12.0–16.0)

## 2021-05-11 LAB — GLUCOSE, CAPILLARY
Glucose-Capillary: 130 mg/dL — ABNORMAL HIGH (ref 70–99)
Glucose-Capillary: 128 mg/dL — ABNORMAL HIGH (ref 70–99)
Glucose-Capillary: 82 mg/dL (ref 70–99)
Glucose-Capillary: 118 mg/dL — ABNORMAL HIGH (ref 70–99)

## 2021-05-11 MED ORDER — METOCLOPRAMIDE HCL 5 MG/ML IJ SOLN
5.0000 mg | INTRAMUSCULAR | Status: DC | PRN
  Administered 2021-05-11 – 2021-05-13 (×8): 5 mg via INTRAVENOUS
  Filled 2021-05-11 (×8): qty 2

## 2021-05-11 MED ORDER — POTASSIUM CHLORIDE CRYS ER 20 MEQ PO TBCR
20.0000 meq | EXTENDED_RELEASE_TABLET | Freq: Every day | ORAL | Status: DC
  Administered 2021-05-12 – 2021-05-13 (×2): 20 meq via ORAL
  Filled 2021-05-11 (×2): qty 1

## 2021-05-11 MED ORDER — AMIODARONE HCL 200 MG PO TABS
200.0000 mg | ORAL_TABLET | Freq: Two times a day (BID) | ORAL | Status: DC
  Administered 2021-05-11 – 2021-05-16 (×10): 200 mg via ORAL
  Filled 2021-05-11 (×10): qty 1

## 2021-05-11 MED ORDER — FUROSEMIDE 10 MG/ML IJ SOLN
40.0000 mg | Freq: Every day | INTRAMUSCULAR | Status: DC
  Administered 2021-05-12: 08:00:00 40 mg via INTRAVENOUS
  Filled 2021-05-11: qty 4

## 2021-05-11 NOTE — Progress Notes (Signed)
Pt arrived to 4E02 from [facility] after CABGx3 w/Dr. Darcey Nora on 05/04/2021.  Telemetry monitor applied and CCMD notified.  Skin assessment and CHG bath completed.  Patient oriented to unit and room to include call light and phone.  All needs addressed and call light within reach.

## 2021-05-11 NOTE — Progress Notes (Signed)
5 Days Post-Op Procedure(s) (LRB): CORONARY ARTERY BYPASS GRAFTING (CABG) x3 on pump using left internal mammary artery and right endoscopic greater saphenous vein conduit. (N/A) TRANSESOPHAGEAL ECHOCARDIOGRAM (TEE) (N/A) APPLICATION OF CELL SAVER ENDOVEIN HARVEST OF GREATER SAPHENOUS VEIN (Right) Subjective: Feeling stronger  Up in chair eating breakfast Off drips nsr Objective: Vital signs in last 24 hours: Temp:  [97.7 F (36.5 C)-98.3 F (36.8 C)] 97.7 F (36.5 C) (01/23 0756) Pulse Rate:  [66-76] 67 (01/23 0700) Cardiac Rhythm: Normal sinus rhythm (01/23 0330) Resp:  [12-25] 16 (01/23 0700) BP: (84-137)/(62-80) 120/76 (01/23 0700) SpO2:  [88 %-100 %] 92 % (01/23 0700) Weight:  [87.6 kg] 87.6 kg (01/23 0542)  Hemodynamic parameters for last 24 hours: CVP:  [2 mmHg-10 mmHg] 8 mmHg  Intake/Output from previous day: 01/22 0701 - 01/23 0700 In: 133 [P.O.:120; I.V.:13] Out: 1400 [Urine:1400] Intake/Output this shift: No intake/output data recorded.       Exam    General- alert and comfortable    Neck- no JVD, no cervical adenopathy palpable, no carotid bruit   Lungs- clear without rales, wheezes. Incision dry   Cor- regular rate and rhythm, no murmur , gallop   Abdomen- soft, non-tender   Extremities - warm, non-tender, minimal edema   Neuro- oriented, appropriate, no focal weakness   Lab Results: Recent Labs    05/10/21 0432 05/11/21 0525  WBC 12.5* 12.6*  HGB 9.2* 9.4*  HCT 27.8* 27.7*  PLT 94* 113*   BMET:  Recent Labs    05/10/21 0432 05/11/21 0525  NA 128* 127*  K 4.1 4.2  CL 94* 91*  CO2 28 27  GLUCOSE 76 111*  BUN 23 23  CREATININE 1.00 1.15  CALCIUM 8.1* 8.0*    PT/INR: No results for input(s): LABPROT, INR in the last 72 hours. ABG    Component Value Date/Time   PHART 7.347 (L) 05/08/2021 0323   HCO3 22.7 05/08/2021 0323   TCO2 22 05/08/2021 1407   ACIDBASEDEF 3.0 (H) 05/08/2021 0323   O2SAT 46.7 05/11/2021 0706   CBG (last 3)   Recent Labs    05/10/21 1612 05/10/21 2236 05/11/21 0738  GLUCAP 97 103* 130*    Assessment/Plan: S/P Procedure(s) (LRB): CORONARY ARTERY BYPASS GRAFTING (CABG) x3 on pump using left internal mammary artery and right endoscopic greater saphenous vein conduit. (N/A) TRANSESOPHAGEAL ECHOCARDIOGRAM (TEE) (N/A) APPLICATION OF CELL SAVER ENDOVEIN HARVEST OF GREATER SAPHENOUS VEIN (Right) Mobilize Diuresis Plan for transfer to step-down: see transfer orders Patient improving and ready for stepdown  LOS: 6 days    Dahlia Byes 05/11/2021

## 2021-05-11 NOTE — Progress Notes (Signed)
Report called to Nicanor Bake, RN on 4E. Patient will be moved to room 2 once room is clean and ready.

## 2021-05-11 NOTE — Progress Notes (Signed)
Patient moved to room 2 on 4E by wheelchair, on tele and o2 with this RN and wife at side. Anderson Malta, RN at bedside at this time. Patient alert with no distress noted.

## 2021-05-11 NOTE — Progress Notes (Signed)
PT Cancellation Note  Patient Details Name: Randall Hodges MRN: [id number] DOB: 09/10/1950   Cancelled Treatment:    Reason Eval/Treat Not Completed: Patient not medically ready  Per RN, pt remains on bedrest until 11:20 after line removal at 10:20. Will reattempt in p.m.   Arby Barrette, PT Acute Rehabilitation Services  Pager [phone] Office [phone]   Rexanne Mano 05/11/2021, 11:01 AM

## 2021-05-11 NOTE — Progress Notes (Signed)
Physical Therapy Treatment Patient Details Name: Randall Hodges MRN: [id number] DOB: 16-Aug-1950 Today's Date: 05/11/2021   History of Present Illness Pt is a 70 y.o. male admitted 05/08/2021 with severe chest pain. S/p emergent CABG x3 1/18. ETT 1/18-1/19. PMH includes prostate CA.    PT Comments    Patient required 2L O2 for ambulating in hall (dropped to 86% at rest on room air). Patient requires assist to manage RW (drifting left and right; especially during turns) and may do better without a device. Will need to continue to assess DME recommendation for discharge.     Recommendations for follow up therapy are one component of a multi-disciplinary discharge planning process, led by the attending physician.  Recommendations may be updated based on patient status, additional functional criteria and insurance authorization.  Follow Up Recommendations  No PT follow up     Assistance Recommended at Discharge Intermittent Supervision/Assistance  Patient can return home with the following A little help with walking and/or transfers;A little help with bathing/dressing/bathroom;Assistance with cooking/housework;Assist for transportation;Help with stairs or ramp for entrance   Equipment Recommendations   (TBD--possibly RW, but may do better without device)    Recommendations for Other Services       Precautions / Restrictions Precautions Precautions: Fall;Sternal Precaution Booklet Issued: No     Mobility  Bed Mobility Overal bed mobility: Needs Assistance Bed Mobility: Rolling, Sidelying to Sit Rolling: Supervision Sidelying to sit: Min guard       General bed mobility comments: instructed in rolling and up from side to sit; required cues to minimize use of UEs for each    Transfers Overall transfer level: Needs assistance Equipment used: Rolling walker (2 wheels) Transfers: Sit to/from Stand Sit to Stand: Min assist           General transfer comment: initial attempt  from EOB unsuccessful; required min assist; from recliner minguard    Ambulation/Gait Ambulation/Gait assistance: Min guard, Min assist Gait Distance (Feet): 240 Feet Assistive device: Rolling walker (2 wheels) (eva walker) Gait Pattern/deviations: Step-through pattern, Decreased stride length, Drifts right/left Gait velocity: Decreased     General Gait Details: Slow, mildly unsteady gait with RW and intermittent minA for stability and walker management; (pt looking around while walking and drifts to left and right sides of the hall);   Stairs             Wheelchair Mobility    Modified Rankin (Stroke Patients Only)       Balance Overall balance assessment: Needs assistance Sitting-balance support: No upper extremity supported, Feet supported Sitting balance-Leahy Scale: Fair     Standing balance support: Bilateral upper extremity supported, During functional activity, Reliant on assistive device for balance Standing balance-Leahy Scale: Poor                              Cognition Arousal/Alertness: Awake/alert Behavior During Therapy: WFL for tasks assessed/performed Overall Cognitive Status: Within Functional Limits for tasks assessed                                          Exercises      General Comments General comments (skin integrity, edema, etc.): Wife present; provided handout for sternal precautions. Pt required frequent cues to adhere to precautions      Pertinent Vitals/Pain Pain Assessment Pain Assessment: 0-10 Pain  Score: 5  Pain Location: rt scapula Pain Descriptors / Indicators: Sore Pain Intervention(s): Limited activity within patient's tolerance, Monitored during session    Home Living                          Prior Function            PT Goals (current goals can now be found in the care plan section) Acute Rehab PT Goals Patient Stated Goal: Return home Time For Goal Achievement:  05/23/21 Potential to Achieve Goals: Good Progress towards PT goals: Progressing toward goals    Frequency    Min 3X/week      PT Plan Current plan remains appropriate    Co-evaluation              AM-PAC PT "6 Clicks" Mobility   Outcome Measure  Help needed turning from your back to your side while in a flat bed without using bedrails?: A Little Help needed moving from lying on your back to sitting on the side of a flat bed without using bedrails?: A Little Help needed moving to and from a bed to a chair (including a wheelchair)?: A Little Help needed standing up from a chair using your arms (e.g., wheelchair or bedside chair)?: A Little Help needed to walk in hospital room?: A Little Help needed climbing 3-5 steps with a railing? : A Little 6 Click Score: 18    End of Session Equipment Utilized During Treatment: Oxygen Activity Tolerance: Patient tolerated treatment well Patient left: in chair;with call bell/phone within reach;with family/visitor present Nurse Communication: Mobility status PT Visit Diagnosis: Other abnormalities of gait and mobility (R26.89);Muscle weakness (generalized) (M62.81)     Time: 4580-9983 PT Time Calculation (min) (ACUTE ONLY): 27 min  Charges:  $Gait Training: 23-37 mins                      Arby Barrette, PT [place]  Pager [phone] Office [phone]    Rexanne Mano 05/11/2021, 4:16 PM

## 2021-05-11 NOTE — Progress Notes (Signed)
Maintaining normal sinus rhythm. Some confusion.

## 2021-05-12 ENCOUNTER — Inpatient Hospital Stay (HOSPITAL_COMMUNITY): Payer: PPO

## 2021-05-12 ENCOUNTER — Ambulatory Visit (HOSPITAL_COMMUNITY)
Admission: RE | Admit: 2021-05-12 | Discharge: 2021-05-12 | Disposition: A | Discharge status: Home / Self Care | Payer: PPO | Source: Ambulatory Visit | Attending: Cardiology | Admitting: Cardiology

## 2021-05-12 LAB — GLUCOSE, CAPILLARY
Glucose-Capillary: 105 mg/dL — ABNORMAL HIGH (ref 70–99)
Glucose-Capillary: 165 mg/dL — ABNORMAL HIGH (ref 70–99)
Glucose-Capillary: 105 mg/dL — ABNORMAL HIGH (ref 70–99)
Glucose-Capillary: 141 mg/dL — ABNORMAL HIGH (ref 70–99)

## 2021-05-12 LAB — CBC
HCT: 29.6 % — ABNORMAL LOW (ref 39.0–52.0)
Hemoglobin: 10.3 g/dL — ABNORMAL LOW (ref 13.0–17.0)
MCH: 30.8 pg (ref 26.0–34.0)
MCHC: 34.8 g/dL (ref 30.0–36.0)
MCV: 88.6 fL (ref 80.0–100.0)
Platelets: 141 10*3/uL — ABNORMAL LOW (ref 150–400)
RBC: 3.34 MIL/uL — ABNORMAL LOW (ref 4.22–5.81)
RDW: 13.5 % (ref 11.5–15.5)
WBC: 14.1 10*3/uL — ABNORMAL HIGH (ref 4.0–10.5)
nRBC: 0.1 % (ref 0.0–0.2)

## 2021-05-12 LAB — BASIC METABOLIC PANEL
Anion gap: 10 (ref 5–15)
BUN: 22 mg/dL (ref 8–23)
CO2: 25 mmol/L (ref 22–32)
Calcium: 8.1 mg/dL — ABNORMAL LOW (ref 8.9–10.3)
Chloride: 95 mmol/L — ABNORMAL LOW (ref 98–111)
Creatinine, Ser: 1.23 mg/dL (ref 0.61–1.24)
GFR, Estimated: >60 mL/min (ref >60)
Glucose, Bld: 119 mg/dL — ABNORMAL HIGH (ref 70–99)
Potassium: 4 mmol/L (ref 3.5–5.1)
Sodium: 130 mmol/L — ABNORMAL LOW (ref 135–145)

## 2021-05-12 MED ORDER — FUROSEMIDE 40 MG PO TABS: 40.0000 mg | ORAL_TABLET | Freq: Every day | ORAL | Status: DC

## 2021-05-12 MED ORDER — GUAIFENESIN ER 600 MG PO TB12
1200.0000 mg | ORAL_TABLET | Freq: Two times a day (BID) | ORAL | Status: DC
  Administered 2021-05-12 – 2021-05-17 (×11): 1200 mg via ORAL
  Filled 2021-05-12 (×12): qty 2

## 2021-05-12 MED ORDER — FUROSEMIDE 10 MG/ML IJ SOLN
40.0000 mg | Freq: Once | INTRAMUSCULAR | Status: AC
  Administered 2021-05-13: 08:00:00 40 mg via INTRAVENOUS
  Filled 2021-05-12: qty 4

## 2021-05-12 MED ORDER — AMIODARONE IV BOLUS ONLY 150 MG/100ML
150.0000 mg | Freq: Once | INTRAVENOUS | Status: AC
  Administered 2021-05-12: 10:00:00 150 mg via INTRAVENOUS
  Filled 2021-05-12: qty 100

## 2021-05-12 NOTE — Progress Notes (Signed)
CARDIAC REHAB PHASE I   Offered to walk with pt. Pt declining at this time stating weakness. Pt educated on importance of sitting in chair, IS use and ambulation. Pt continues to decline. Pt requesting to get back into bed. Convinced pt to stay in recliner. Given Flutter valve. Plans to walk in 41min. Will f/u.  [id number] Rufina Falco, RN BSN 05/12/2021 9:55 AM

## 2021-05-12 NOTE — Progress Notes (Signed)
CARDIAC REHAB PHASE I   PRE:  Rate/Rhythm: 70 SR    SaO2: 97 2L  MODE:  Ambulation: 320 ft   POST:  Rate/Rhythm: 83 SR  BP:  Sitting: 116/63    SaO2: 85 RA --> 92 2L   Pt assisted to BR for B, than ambulated 362ft in hallway assist of one with front wheel walker. Pt took several short standing rest breaks c/o weakness, minor dizziness, and SOB. Pt coached through purse lipped breathing. Pt returned to recliner, placed back on [place]. Stressed importance of sitting in recliner, IS/Flutter use, and ambulation. Pt and wife agreeable. Pt states he thinks metoprolol is making him weak and dizzy, RN aware. Pt and wife deny DME needs at this time. Will continue to follow.  [id number] Rufina Falco, RN BSN 05/12/2021 11:20 AM

## 2021-05-12 NOTE — Progress Notes (Signed)
Brief AF. Otherwise stable.

## 2021-05-12 NOTE — Care Management Important Message (Signed)
Important Message  Patient Details  Name: Randall Hodges MRN: [id number] Date of Birth: 1950/08/09   Medicare Important Message Given:  Yes     Randall Hodges, Randall Hodges 05/12/2021, 11:31 AM

## 2021-05-12 NOTE — Progress Notes (Addendum)
Educated patient on importance of using incentive, out of bed and ambulation. Pt had spirometer in bag from yesterday. Patient verbalized understanding. Md ordered flutter valve as well. Payton Emerald, RN

## 2021-05-12 NOTE — Progress Notes (Signed)
Encouraged patient to ambulate more.  Patient states if we come back in 15-20 minutes he may feel more like walking. Pt resting with call bell within reach.  Will continue to monitor. Payton Emerald, RN

## 2021-05-12 NOTE — Progress Notes (Signed)
Mobility Specialist Progress Note   05/12/21 1810  Mobility  Activity Ambulated with assistance in hallway  Level of Assistance Minimal assist, patient does 75% or more  Assistive Device Front wheel walker  Distance Ambulated (ft) 280 ft  Activity Response Tolerated well  $Mobility charge 1 Mobility   Received pt in chair on 2LO2 via [place] w/ a bout of hiccups but no pain, agreeable to mobility. X1 standing rest break d/t LE fatigue, SpO2 ranged from 86% - 93% throughout ambulation  on 2LO2. Returned BTB w/ no further compliant, call bell in reach and needs met.   Holland Falling Mobility Specialist Phone Number [phone]

## 2021-05-12 NOTE — Progress Notes (Addendum)
[address]       [place] [address]             [phone]      6 Days Post-Op Procedure(s) (LRB): CORONARY ARTERY BYPASS GRAFTING (CABG) x3 on pump using left internal mammary artery and right endoscopic greater saphenous vein conduit. (N/A) TRANSESOPHAGEAL ECHOCARDIOGRAM (TEE) (N/A) APPLICATION OF CELL SAVER ENDOVEIN HARVEST OF GREATER SAPHENOUS VEIN (Right) Subjective:  Transferred from ICU to 4E yesterday.  Awake and alert this morning. No news concerns.   Had BM x 2 yesterday.  O2 at 2L/[place]   Objective: Vital signs in last 24 hours: Temp:  [97.7 F (36.5 C)-98.6 F (37 C)] 98 F (36.7 C) (01/24 0745) Pulse Rate:  [67-99] 81 (01/24 0745) Cardiac Rhythm: Normal sinus rhythm;Bundle branch block (01/24 0812) Resp:  [16-25] 25 (01/24 0745) BP: (96-139)/(63-123) 132/76 (01/24 0745) SpO2:  [92 %-100 %] 97 % (01/24 0745) Weight:  [87.8 kg] 87.8 kg (01/24 0302)    Intake/Output from previous day: 01/23 0701 - 01/24 0700 In: 1200 [P.O.:1200] Out: 1675 [Urine:1675] Intake/Output this shift: No intake/output data recorded.  General appearance: alert, cooperative, and no distress Neurologic: intact Heart: currently in SR but has been in and out of A-fib several times over night with fairly welll controlled rate.  Lungs: breath sounds clear, diminished. O2 sats adequate on 2L/[place] O2. Abdomen: soft, non-tender Extremities: 1+ LE edema. The RLE EVH incision is intact and dry.  Wound: the sternotomy incision is well approximated and dry.   Lab Results: Recent Labs    05/11/21 0525 05/12/21 0139  WBC 12.6* 14.1*  HGB 9.4* 10.3*  HCT 27.7* 29.6*  PLT 113* 141*   BMET:  Recent Labs    05/11/21 0525 05/12/21 0139  NA 127* 130*  K 4.2 4.0  CL 91* 95*  CO2 27 25  GLUCOSE 111* 119*  BUN 23 22  CREATININE 1.15 1.23  CALCIUM 8.0* 8.1*    PT/INR: No results for input(s): LABPROT, INR in the last 72 hours. ABG    Component Value  Date/Time   PHART 7.347 (L) 05/08/2021 0323   HCO3 22.7 05/08/2021 0323   TCO2 22 05/08/2021 1407   ACIDBASEDEF 3.0 (H) 05/08/2021 0323   O2SAT 46.7 05/11/2021 0706   CBG (last 3)  Recent Labs    05/11/21 1652 05/11/21 2102 05/12/21 0601  GLUCAP 82 118* 105*    CLINICAL DATA:  Status post coronary bypass graft.   EXAM: PORTABLE CHEST 1 VIEW   COMPARISON:  May 11, 2021.   FINDINGS: Stable cardiomegaly. Status post coronary bypass graft. Stable bilateral upper lobe and basilar airspace opacities are noted concerning for pneumonia. Bony thorax is unremarkable.   IMPRESSION: Stable bilateral opacities are noted concerning for multifocal pneumonia.     Electronically Signed   By: Marijo Conception M.D.   On: 05/12/2021 08:17   Assessment/Plan: S/P Procedure(s) (LRB): CORONARY ARTERY BYPASS GRAFTING (CABG) x3 on pump using left internal mammary artery and right endoscopic greater saphenous vein conduit. (N/A) TRANSESOPHAGEAL ECHOCARDIOGRAM (TEE) (N/A) APPLICATION OF CELL SAVER ENDOVEIN HARVEST OF GREATER SAPHENOUS VEIN (Right)  -POD-6 CABG x 3 presenting with acute STEMI and preserved LVF. On ASA, metoprolol, and Crestor. BP is stable.  -Paroxysmal a-fib- rates have been controlled. He tells me he has been having this for about 10 months. Will give another bolus if IV amio this morning. Will need to consider anticoagulation if a-fib persists.   -PULM-  bilateral pulm opacities "concerning for pneumonia" per radiology report. He does have a leukocytosis. Currently not on any antibiotics. Stress need for pulmonary hygiene and ambulation. Add Mucinex. Monitor.   -Disposition-anticipate discharge to home after rhythm issues resolved and O2 weaned off. PT does not think he will require any services at discharge.    LOS: 7 days    Antony Odea, PA-C 05/12/2021   He will need further inpatient care for iv lasix and O2 wean Cont po amiodarone for postop  afib Check echo for effusion before starting anticoagulation EPWs remain today  patient examined and medical record reviewed,agree with above note. Dahlia Byes 05/12/2021

## 2021-05-13 ENCOUNTER — Inpatient Hospital Stay (HOSPITAL_COMMUNITY): Payer: PPO

## 2021-05-13 DIAGNOSIS — I3139 Other pericardial effusion (noninflammatory): Secondary | ICD-10-CM

## 2021-05-13 LAB — BASIC METABOLIC PANEL
Anion gap: 7 (ref 5–15)
BUN: 20 mg/dL (ref 8–23)
CO2: 29 mmol/L (ref 22–32)
Calcium: 8.2 mg/dL — ABNORMAL LOW (ref 8.9–10.3)
Chloride: 91 mmol/L — ABNORMAL LOW (ref 98–111)
Creatinine, Ser: 1.07 mg/dL (ref 0.61–1.24)
GFR, Estimated: >60 mL/min (ref >60)
Glucose, Bld: 115 mg/dL — ABNORMAL HIGH (ref 70–99)
Potassium: 4.4 mmol/L (ref 3.5–5.1)
Sodium: 127 mmol/L — ABNORMAL LOW (ref 135–145)

## 2021-05-13 LAB — LIPID PANEL
Cholesterol: 71 mg/dL (ref 0–200)
HDL: 25 mg/dL — ABNORMAL LOW (ref >40)
LDL Cholesterol: 35 mg/dL (ref 0–99)
Total CHOL/HDL Ratio: 2.8 RATIO
Triglycerides: 56 mg/dL (ref <150)
VLDL: 11 mg/dL (ref 0–40)

## 2021-05-13 LAB — ECHOCARDIOGRAM LIMITED
Height: 70 in
S' Lateral: 4.1 cm
Single Plane A4C EF: 32.2 %
Weight: 3061.75 oz

## 2021-05-13 LAB — CBC
HCT: 27.8 % — ABNORMAL LOW (ref 39.0–52.0)
Hemoglobin: 9.6 g/dL — ABNORMAL LOW (ref 13.0–17.0)
MCH: 30.8 pg (ref 26.0–34.0)
MCHC: 34.5 g/dL (ref 30.0–36.0)
MCV: 89.1 fL (ref 80.0–100.0)
Platelets: 153 10*3/uL (ref 150–400)
RBC: 3.12 MIL/uL — ABNORMAL LOW (ref 4.22–5.81)
RDW: 13.8 % (ref 11.5–15.5)
WBC: 14.5 10*3/uL — ABNORMAL HIGH (ref 4.0–10.5)
nRBC: 0.1 % (ref 0.0–0.2)

## 2021-05-13 LAB — GLUCOSE, CAPILLARY: Glucose-Capillary: 116 mg/dL — ABNORMAL HIGH (ref 70–99)

## 2021-05-13 MED ORDER — CHLORPROMAZINE HCL 25 MG/ML IJ SOLN
25.0000 mg | Freq: Three times a day (TID) | INTRAMUSCULAR | Status: DC | PRN
  Administered 2021-05-13 – 2021-05-14 (×2): 25 mg via INTRAMUSCULAR
  Filled 2021-05-13 (×3): qty 1

## 2021-05-13 MED ORDER — BACLOFEN 5 MG HALF TABLET
5.0000 mg | ORAL_TABLET | Freq: Three times a day (TID) | ORAL | Status: DC
  Administered 2021-05-13: 08:00:00 5 mg via ORAL
  Filled 2021-05-13 (×3): qty 1

## 2021-05-13 MED ORDER — FUROSEMIDE 10 MG/ML IJ SOLN
40.0000 mg | Freq: Every day | INTRAMUSCULAR | Status: DC
  Administered 2021-05-14 – 2021-05-16 (×3): 40 mg via INTRAVENOUS
  Filled 2021-05-13 (×3): qty 4

## 2021-05-13 MED ORDER — BACLOFEN 10 MG PO TABS
10.0000 mg | ORAL_TABLET | Freq: Three times a day (TID) | ORAL | Status: DC
  Administered 2021-05-13: 16:00:00 10 mg via ORAL
  Filled 2021-05-13 (×2): qty 1

## 2021-05-13 MED ORDER — CEFDINIR 300 MG PO CAPS
300.0000 mg | ORAL_CAPSULE | Freq: Two times a day (BID) | ORAL | Status: DC
  Administered 2021-05-13 – 2021-05-17 (×7): 300 mg via ORAL
  Filled 2021-05-13 (×9): qty 1

## 2021-05-13 MED ORDER — METOLAZONE 5 MG PO TABS: 5.0000 mg | ORAL_TABLET | Freq: Once | ORAL | Status: DC

## 2021-05-13 NOTE — Progress Notes (Signed)
Physical Therapy Treatment Patient Details Name: Randall Hodges MRN: [id number] DOB: [birth year] Today's Date: 05/13/2021   History of Present Illness Pt is a 71 y.o. male admitted 05/11/2021 with severe chest pain. S/p emergent CABG x3 1/18. ETT 1/18-1/19. PMH includes prostate CA.   PT Comments    Pt progressing with mobility. Today's session focused on gait training without DME; pt requiring intermittent minA to maintain balance with ambulation. Pt endorses low motivation due to fatigue and persistent hiccups, but ultimately agreeable to participate with max encouragement. Reinforced educ re: activity recommendations, IS/flutter valve use, importance of mobility. Pt remains limited by generalized weakness, decreased activity tolerance, and impaired balance strategies/postural reactions. Will continue to follow acutely to address established goals.  SpO2 78% on RA with ambulation (unsure if reliable reading via portable pulse ox, but HR did match telemetry HR reading) SpO2 up to >/88% when 2L O2 [place] replaced HR 80s-90s Post-mobility BP 141/75.     Recommendations for follow up therapy are one component of a multi-disciplinary discharge planning process, led by the attending physician.  Recommendations may be updated based on patient status, additional functional criteria and insurance authorization.  Follow Up Recommendations  No PT follow up     Assistance Recommended at Discharge Intermittent Supervision/Assistance  Patient can return home with the following A little help with walking and/or transfers;A little help with bathing/dressing/bathroom;Assistance with cooking/housework;Assist for transportation;Help with stairs or ramp for entrance   Equipment Recommendations   (TBD on RW vs. rollator)    Recommendations for Other Services       Precautions / Restrictions Precautions Precautions: Fall;Sternal;Other (comment) Precaution Comments: watch SpO2 (does not wear baseline)      Mobility  Bed Mobility Overal bed mobility: Independent             General bed mobility comments: educ on log roll technique from flat bed, pt able to perform well towards R-side without assist    Transfers Overall transfer level: Needs assistance Equipment used: None Transfers: Sit to/from Stand Sit to Stand: Min guard           General transfer comment: pt still requiring verbal cues for sequencing and technique; able to stand with hands by knees, no DME, min guard for balance    Ambulation/Gait Ambulation/Gait assistance: Min guard, Min assist Gait Distance (Feet): 270 Feet Assistive device: None Gait Pattern/deviations: Step-through pattern, Decreased stride length, Drifts right/left Gait velocity: Decreased     General Gait Details: Slow, mildly unsteady gait without DME, initial intermittent minA for stability, improving with distance; 2x standing rest break to lean against hallway rail, pt c/o dizziness and fatigue; pt reports, "I have to stop myself from falling forward"   Stairs             Wheelchair Mobility    Modified Rankin (Stroke Patients Only)       Balance Overall balance assessment: Needs assistance Sitting-balance support: No upper extremity supported, Feet supported Sitting balance-Leahy Scale: Good     Standing balance support: Bilateral upper extremity supported, During functional activity, Reliant on assistive device for balance, No upper extremity supported Standing balance-Leahy Scale: Fair Standing balance comment: can static stand and ambulate without UE support                            Cognition Arousal/Alertness: Awake/alert Behavior During Therapy: WFL for tasks assessed/performed, Flat affect Overall Cognitive Status: Within Functional Limits for tasks assessed  General Comments: WFL for simple tasks; not formally assessed - requires increased  encouragement to particiapte; pt reports, "I'm lacking motivation, I'm tired"        Exercises Other Exercises Other Exercises: Incentive spirometer x10 - pt still only pulling ~750 mL - reports performing 6-7x/day despite repeated educ on importance and increasing frequency; flutter valve x5, cues for technique    General Comments General comments (skin integrity, edema, etc.): Spo2 down to 78% on RA with ambulation (unsure if reliable reading via portable pulse ox, but HR matching telemetry HR reading); SpO2 up to >/88% when 2L O2 [place] replaced; HR 80s-90s; pt c/o dizziness with ambulation, post-mobility BP 141/75. Pt notes improvement in hiccups. Max education on importance of increased activity despite low motivation      Pertinent Vitals/Pain Pain Assessment Pain Assessment: Faces Faces Pain Scale: Hurts little more Pain Location: abdomen with hiccups Pain Descriptors / Indicators: Sore, Discomfort Pain Intervention(s): Monitored during session, Limited activity within patient's tolerance    Home Living                          Prior Function            PT Goals (current goals can now be found in the care plan section) Progress towards PT goals: Progressing toward goals    Frequency    Min 3X/week      PT Plan Current plan remains appropriate    Co-evaluation              AM-PAC PT "6 Clicks" Mobility   Outcome Measure  Help needed turning from your back to your side while in a flat bed without using bedrails?: None Help needed moving from lying on your back to sitting on the side of a flat bed without using bedrails?: None Help needed moving to and from a bed to a chair (including a wheelchair)?: A Little Help needed standing up from a chair using your arms (e.g., wheelchair or bedside chair)?: A Little Help needed to walk in hospital room?: A Little Help needed climbing 3-5 steps with a railing? : A Little 6 Click Score: 20    End of Session  Equipment Utilized During Treatment: Gait belt Activity Tolerance: Patient tolerated treatment well;Patient limited by fatigue Patient left: in chair;with call bell/phone within reach Nurse Communication: Mobility status PT Visit Diagnosis: Other abnormalities of gait and mobility (R26.89);Muscle weakness (generalized) (M62.81)     Time: 9323-5573 PT Time Calculation (min) (ACUTE ONLY): 26 min  Charges:  $Gait Training: 8-22 mins $Therapeutic Exercise: 8-22 mins                     Mabeline Caras, PT, DPT Acute Rehabilitation Services  Pager [phone] Office [place] 05/13/2021, 10:09 AM

## 2021-05-13 NOTE — Progress Notes (Signed)
Echocardiogram 2D Echocardiogram has been performed.  Randall Hodges 05/13/2021, 9:28 AM

## 2021-05-13 NOTE — Progress Notes (Signed)
Pacing wires removed per order. Patient tolerated well. Wires intact on removal. Vital signs obtained before and after removal. Vital signs on sequence every 15 minutes for an hour.  Educated patient on one hour of bedrest. Will continue to monitor.

## 2021-05-13 NOTE — Progress Notes (Signed)
CARDIAC REHAB PHASE I   PRE:  Rate/Rhythm: 78 SR      SaO2: 60-70s RA --> mid 80s on 2L   Offered to walk with pt earlier and pt finishing lunch. Returned and pt not feeling great. O2 probe placed on pt, sats reading 60-70s with even waveform and HR matching. Pt and wife state he has been off oxygen most of the day. Pt placed on 2L [place], sats rose to mid 80s. RN and PA made aware with instructions to keep sats in the 90s. Pt turned up to 3L [place], sats 87-90. Pt on EOB trying to use urinal. Pt assisted to stand to use and unable to void. Pt assisted to recliner. Encouraged Pt sit up OOB and use IS/Flutter. Will continue to follow.  Rufina Falco, RN BSN 05/13/2021 2:20 PM

## 2021-05-13 NOTE — Progress Notes (Addendum)
[address]       [place] [address]             [phone]      7 Days Post-Op Procedure(s) (LRB): CORONARY ARTERY BYPASS GRAFTING (CABG) x3 on pump using left internal mammary artery and right endoscopic greater saphenous vein conduit. (N/A) TRANSESOPHAGEAL ECHOCARDIOGRAM (TEE) (N/A) APPLICATION OF CELL SAVER ENDOVEIN HARVEST OF GREATER SAPHENOUS VEIN (Right) Subjective:   Awake and alert this. Says he was reluctant to walk yesterday because he feels weak and unsteady.  Also reports persistent hiccups.   O2 at 2L/[place]   Objective: Vital signs in last 24 hours: Temp:  [97.7 F (36.5 C)-98.2 F (36.8 C)] 98 F (36.7 C) (01/25 0416) Pulse Rate:  [70-81] 77 (01/25 0416) Cardiac Rhythm: Normal sinus rhythm;Bundle branch block (01/24 1900) Resp:  [17-25] 17 (01/25 0416) BP: (112-132)/(71-85) 121/76 (01/25 0416) SpO2:  [94 %-97 %] 94 % (01/25 0416) Weight:  [86.8 kg-87.6 kg] 86.8 kg (01/25 0600)    Intake/Output from previous day: 01/24 0701 - 01/25 0700 In: 250 [P.O.:250] Out: 1375 [Urine:1375] Intake/Output this shift: No intake/output data recorded.  General appearance: alert, cooperative, and mild distress  due to hiccups. Neurologic: intact Heart: No further atrial fibrillation yesterday after conversion.  Lungs: breath sounds clear, diminished. O2 sats adequate on 2L/[place] O2. Abdomen: soft, non-tender. Having hiccups now while trying to eat breakfast. Extremities: 1+ LE edema. The RLE EVH incision is intact and dry.  Wound: the sternotomy incision is well approximated and dry.   Lab Results: Recent Labs    05/12/21 0139 05/13/21 0056  WBC 14.1* 14.5*  HGB 10.3* 9.6*  HCT 29.6* 27.8*  PLT 141* 153    BMET:  Recent Labs    05/12/21 0139 05/13/21 0056  NA 130* 127*  K 4.0 4.4  CL 95* 91*  CO2 25 29  GLUCOSE 119* 115*  BUN 22 20  CREATININE 1.23 1.07  CALCIUM 8.1* 8.2*     PT/INR: No results for input(s): LABPROT, INR in  the last 72 hours. ABG    Component Value Date/Time   PHART 7.347 (L) 05/08/2021 0323   HCO3 22.7 05/08/2021 0323   TCO2 22 05/08/2021 1407   ACIDBASEDEF 3.0 (H) 05/08/2021 0323   O2SAT 46.7 05/11/2021 0706   CBG (last 3)  Recent Labs    05/12/21 1605 05/12/21 2101 05/13/21 0600  GLUCAP 105* 141* 116*       Assessment/Plan: S/P Procedure(s) (LRB): CORONARY ARTERY BYPASS GRAFTING (CABG) x3 on pump using left internal mammary artery and right endoscopic greater saphenous vein conduit. (N/A) TRANSESOPHAGEAL ECHOCARDIOGRAM (TEE) (N/A) APPLICATION OF CELL SAVER ENDOVEIN HARVEST OF GREATER SAPHENOUS VEIN (Right)  -POD-7 CABG x 3 presenting with acute STEMI and preserved LVF. On ASA, metoprolol, and Crestor. BP and HR are stable.  -Paroxysmal a-fib- rates have been controlled. He tells me he has been having this for about 10 months. No further a-fib past 24 hours. Continue amio and metoprolol.   -GI- persistent hiccups. Already on Reglan and Protonix. Will give los-dose Baclofen TID today.   -PULM- CXR still showing opacities in bilateral upper lung zones.  Discussed need for pulmonary hygiene and ambulation. Continue Mucinex, has Lasix IV ordered for 1-time dose this AM. Monitor.   -Hyponatremia-Suspect related to diuresis. Monitoring.   -Disposition-anticipate discharge to home when independent with mobility and O2 weaned off. PT does not think he will require any services at discharge.  LOS: 8 days    Antony Odea, PA-C 05/13/2021  Remains with low O2 sats off [place]- cont iv lasix and add metolazone q am  Last cxr with bilateral opacities- no feverwith mild cough, wbc mildly up 14k- will start po antibiotics for poss bronchitis Change to baclofen for persistent hiccups Echo shows no sig pericardial effusion, LV systolic function looks good w/ report pending Will keep patient in hospital until stronger and O2 weaned off  patient examined and medical record  reviewed,agree with above note. Dahlia Byes 05/13/2021

## 2021-05-13 NOTE — Progress Notes (Signed)
Stable without arrhythmia.

## 2021-05-13 NOTE — Progress Notes (Signed)
Patient stating. "He is seeing ants on the bed and butterflies flying in the room". Notified PA.

## 2021-05-14 ENCOUNTER — Other Ambulatory Visit: Payer: Self-pay

## 2021-05-14 ENCOUNTER — Inpatient Hospital Stay (HOSPITAL_COMMUNITY): Payer: PPO

## 2021-05-14 DIAGNOSIS — I249 Acute ischemic heart disease, unspecified: Secondary | ICD-10-CM | POA: Diagnosis not present

## 2021-05-14 DIAGNOSIS — Z951 Presence of aortocoronary bypass graft: Secondary | ICD-10-CM | POA: Diagnosis not present

## 2021-05-14 LAB — CBC
HCT: 28.7 % — ABNORMAL LOW (ref 39.0–52.0)
Hemoglobin: 9.6 g/dL — ABNORMAL LOW (ref 13.0–17.0)
MCH: 30.4 pg (ref 26.0–34.0)
MCHC: 33.4 g/dL (ref 30.0–36.0)
MCV: 90.8 fL (ref 80.0–100.0)
Platelets: 174 10*3/uL (ref 150–400)
RBC: 3.16 MIL/uL — ABNORMAL LOW (ref 4.22–5.81)
RDW: 13.7 % (ref 11.5–15.5)
WBC: 12.4 10*3/uL — ABNORMAL HIGH (ref 4.0–10.5)
nRBC: 0 % (ref 0.0–0.2)

## 2021-05-14 LAB — BASIC METABOLIC PANEL
Anion gap: 12 (ref 5–15)
BUN: 20 mg/dL (ref 8–23)
CO2: 25 mmol/L (ref 22–32)
Calcium: 8.4 mg/dL — ABNORMAL LOW (ref 8.9–10.3)
Chloride: 92 mmol/L — ABNORMAL LOW (ref 98–111)
Creatinine, Ser: 1.04 mg/dL (ref 0.61–1.24)
GFR, Estimated: >60 mL/min (ref >60)
Glucose, Bld: 92 mg/dL (ref 70–99)
Potassium: 4.8 mmol/L (ref 3.5–5.1)
Sodium: 129 mmol/L — ABNORMAL LOW (ref 135–145)

## 2021-05-14 MED ORDER — METOLAZONE 5 MG PO TABS
5.0000 mg | ORAL_TABLET | Freq: Every day | ORAL | Status: AC
  Administered 2021-05-14 – 2021-05-16 (×3): 5 mg via ORAL
  Filled 2021-05-14 (×3): qty 1

## 2021-05-14 MED ORDER — MORPHINE SULFATE (PF) 2 MG/ML IV SOLN
2.0000 mg | INTRAVENOUS | Status: AC | PRN
  Administered 2021-05-14 – 2021-05-15 (×2): 2 mg via INTRAVENOUS
  Filled 2021-05-14 (×2): qty 1

## 2021-05-14 MED ORDER — METOLAZONE 5 MG PO TABS
5.0000 mg | ORAL_TABLET | Freq: Once | ORAL | Status: AC
  Administered 2021-05-14: 5 mg via ORAL
  Filled 2021-05-14: qty 1

## 2021-05-14 MED ORDER — GABAPENTIN 300 MG PO CAPS
300.0000 mg | ORAL_CAPSULE | Freq: Three times a day (TID) | ORAL | Status: DC
  Administered 2021-05-14 – 2021-05-16 (×8): 300 mg via ORAL
  Filled 2021-05-14 (×8): qty 1

## 2021-05-14 MED ORDER — PROPOFOL 1000 MG/100ML IV EMUL
INTRAVENOUS | Status: AC
  Filled 2021-05-14: qty 200

## 2021-05-14 MED ORDER — PHENYLEPHRINE HCL-NACL 20-0.9 MG/250ML-% IV SOLN
INTRAVENOUS | Status: AC
  Filled 2021-05-14: qty 500

## 2021-05-14 MED ORDER — FUROSEMIDE 10 MG/ML IJ SOLN
40.0000 mg | Freq: Once | INTRAMUSCULAR | Status: AC
  Administered 2021-05-14: 40 mg via INTRAVENOUS
  Filled 2021-05-14: qty 4

## 2021-05-14 MED ORDER — AMIODARONE IV BOLUS ONLY 150 MG/100ML
150.0000 mg | Freq: Once | INTRAVENOUS | Status: AC
  Administered 2021-05-14: 150 mg via INTRAVENOUS
  Filled 2021-05-14: qty 100

## 2021-05-14 MED ORDER — PANTOPRAZOLE SODIUM 40 MG PO TBEC
40.0000 mg | DELAYED_RELEASE_TABLET | Freq: Two times a day (BID) | ORAL | Status: DC
  Administered 2021-05-14 – 2021-05-17 (×6): 40 mg via ORAL
  Filled 2021-05-14 (×6): qty 1

## 2021-05-14 NOTE — Progress Notes (Addendum)
Patient with persistent hiccups this afternoon, included with this is abdominal spasms with the hiccups. patient stating he is weak and appears to be very tired/fatigued. Vital signs obtained at that time HR 92 with frequent PVCs on monitor, bp 114/63. [place] Cutter [facility] made aware and at bedside to see patient. Orders were received. Patient wanted to ambulate to door and back to bed. This was completed with rolling walker, gait unsteady.  assisted back to bed call bell within reach. Avian [place]aw[place], Bettina Gavia RN

## 2021-05-14 NOTE — Progress Notes (Signed)
° °   °  [address]       [place] [address]             [phone]      Called to see patient for persistent hiccups and related chest and abdominal discomfort. This has been occurring off and on for the past 4-5 days.  He is having frequent spasms of abdominal muscles now with each hiccup. In addition to the PPI he has been on since surgery, he has also been treated with Reglan, Baclofen, and most recently thorazine--all with minimal benefit.  He currently looks quite fatigued and says he is very uncomfortable due to the spasms. Will d/c the thorazine and start gabapentin 300mg  po TID.  Increasing the Protonix to BID. Will allow morphine prn for pain x 24 hrs while the gabapentin is being loaded.   Macarthur Critchley, PA-C

## 2021-05-14 NOTE — Progress Notes (Addendum)
Patient with intermittent hiccoughs this AM. But increasing in frequency this afternoon. Thorazine given as ordered as needed for hiccoughs. Johntay Doolen, Bettina Gavia RN

## 2021-05-14 NOTE — Progress Notes (Signed)
CARDIAC REHAB PHASE I   PRE:  Rate/Rhythm: 85 SR  BP:  Sitting: 114/88      SaO2: 95 3L  MODE:  Ambulation: 200 ft   POST:  Rate/Rhythm: 98 SR with PVCs  BP:  Sitting: 125/59    SaO2: 92 4L   After some encouragement, pt agreeable to try ambulation. Pt assisted to EOB and to stand to use urinal. Pt unsteady on feet. Pt then agreeable to ambulate in hallway. Pt ambulated ~257ft in hallway assist of one with gait belt and front wheel walker. Pt needing consistent reminders to not run into wall or objects in hallway. Pt slow to process and answer questions. Pt took several rest breaks c/o SOB. Pt returned to recliner. Sats 92 on 4L. RN at bedside. Encouraged pt to sit in recliner til bed and get OOB for breakfast tomorrow. Will continue to follow.  [phone] Rufina Falco, RN BSN 05/14/2021 2:06 PM

## 2021-05-14 NOTE — Progress Notes (Signed)
[address]       [place] [address]             [phone]      8 Days Post-Op Procedure(s) (LRB): CORONARY ARTERY BYPASS GRAFTING (CABG) x3 on pump using left internal mammary artery and right endoscopic greater saphenous vein conduit. (N/A) TRANSESOPHAGEAL ECHOCARDIOGRAM (TEE) (N/A) APPLICATION OF CELL SAVER ENDOVEIN HARVEST OF GREATER SAPHENOUS VEIN (Right) Subjective:   Just returned from the radiology department for chest x-ray.  Says he continues to have a lot of soreness along his sternum.  Did not rest well last night.  No hiccups overnight.  Oxygen desaturated while on room air yesterday morning.  Back on O2 at 2L/[place].   Objective: Vital signs in last 24 hours: Temp:  [97.6 F (36.4 C)-98.3 F (36.8 C)] 97.9 F (36.6 C) (01/26 0303) Pulse Rate:  [75-89] 89 (01/26 0700) Cardiac Rhythm: Normal sinus rhythm (01/25 1916) Resp:  [12-20] 18 (01/26 0700) BP: (99-140)/(60-80) 126/73 (01/26 0700) SpO2:  [94 %-99 %] 96 % (01/26 0700)    Intake/Output from previous day: 01/25 0701 - 01/26 0700 In: 850 [P.O.:840; I.V.:10] Out: 1300 [Urine:1300] Intake/Output this shift: No intake/output data recorded.  General appearance: alert, cooperative, and mild distress due to incisional soreness. Neurologic: intact Heart: Remains in stable sinus rhythm  Lungs: breath sounds clear, diminished. O2 sats adequate on 2L/[place] O2. Cough is dry and non-productive.  CXR showing bilateral opacities in upper lung zones.  Abdomen: soft, non-tender.  Extremities: 1+ LE edema. The RLE EVH incision is intact and dry.  Wound: the sternotomy incision is well approximated and dry.   Lab Results: Recent Labs    05/13/21 0056 05/14/21 0148  WBC 14.5* 12.4*  HGB 9.6* 9.6*  HCT 27.8* 28.7*  PLT 153 174    BMET:  Recent Labs    05/13/21 0056 05/14/21 0148  NA 127* 129*  K 4.4 4.8  CL 91* 92*  CO2 29 25  GLUCOSE 115* 92  BUN 20 20  CREATININE 1.07 1.04   CALCIUM 8.2* 8.4*     PT/INR: No results for input(s): LABPROT, INR in the last 72 hours. ABG    Component Value Date/Time   PHART 7.347 (L) 05/08/2021 0323   HCO3 22.7 05/08/2021 0323   TCO2 22 05/08/2021 1407   ACIDBASEDEF 3.0 (H) 05/08/2021 0323   O2SAT 46.7 05/11/2021 0706   CBG (last 3)  Recent Labs    05/12/21 1605 05/12/21 2101 05/13/21 0600  GLUCAP 105* 141* 116*   ECHOCARDIOGRAM LIMITED REPORT         Patient Name:   Randall Hodges Date of Exam: 05/13/2021  Medical Rec #:  [id number]       Height:       70.0 in  Accession #:    [id number]      Weight:       191.4 lb  Date of Birth:  November 22, 1950       BSA:          2.049 m  Patient Age:    71 years        BP:           115/74 mmHg  Patient Gender: M               HR:           82 bpm.  Exam Location:  Inpatient   Procedure: 2D Echo and Limited Echo  Indications:    Pericardial Effusion     History:        Patient has prior history of Echocardiogram examinations,  most                  recent 08/21/2020. CAD.     Sonographer:    Jefferey Pica  Referring Phys: [phone] PETER VANTRIGT      Sonographer Comments: Could not obtain images from subcostal window due to  dressings.  IMPRESSIONS     1. Left ventricular ejection fraction, by estimation, is 45%. The left  ventricle demonstrates global hypokinesis. Prominent septal bounce, this  looks respirophasic.   2. No evidence of mitral stenosis.   3. The aortic valve is tricuspid. Aortic valve sclerosis/calcification is  present, without any evidence of aortic stenosis.   4. Aortic dilatation noted. There is mild dilatation of the ascending  aorta, measuring 37 mm.   5. Small circumferential pericardial effusion with some organization. The  IVC was not visualized. There is respirophasic variation of the  interventricular septum. I do not think tamponade is present, but there  may be some pericardial restriction  creating the septal findings => possible  early effusive/constrictive  picture.   6. Limited echo.   FINDINGS   Left Ventricle: Left ventricular ejection fraction, by estimation, is  45%. The left ventricle demonstrates global hypokinesis. The left  ventricular internal cavity size was normal in size. There is no left  ventricular hypertrophy.   Left Atrium: Left atrial size was normal in size.   Right Atrium: Right atrial size was normal in size.   Mitral Valve: There is mild calcification of the mitral valve leaflet(s).  No evidence of mitral valve stenosis.   Aortic Valve: The aortic valve is tricuspid. Aortic valve  sclerosis/calcification is present, without any evidence of aortic  stenosis.   Aorta: Aortic dilatation noted. There is mild dilatation of the ascending  aorta, measuring 37 mm.   Venous: The inferior vena cava was not well visualized.   IAS/Shunts: No atrial level shunt detected by color flow Doppler.     Assessment/Plan: S/P Procedure(s) (LRB): CORONARY ARTERY BYPASS GRAFTING (CABG) x3 on pump using left internal mammary artery and right endoscopic greater saphenous vein conduit. (N/A) TRANSESOPHAGEAL ECHOCARDIOGRAM (TEE) (N/A) APPLICATION OF CELL SAVER ENDOVEIN HARVEST OF GREATER SAPHENOUS VEIN (Right)  -POD-8 CABG x 3 presenting with acute STEMI . EF 45% with global hypokinesis on echo yesterday, very small pleural effusion. On ASA, metoprolol, and Crestor. BP and HR are stable.  -Paroxysmal a-fib- maintained SR past 48 hours. Continue amio and metoprolol and hold off on anticoagulation  -GI- persistent hiccups. Seems better with Baclofen but was having some hallucinations yesterday possibly due to Baclofen + Tramadol.   -PULM- CXR still showing opacities in bilateral upper lung zones. Mild leukocytosis improving. PO cefdinir started yesterday. Need for pulmonary hygiene and ambulation discussed again.   -Hyponatremia-Suspect related to diuresis, Na+ trending up. Monitoring.    -Disposition-anticipate discharge to home when independent with mobility and O2 weaned off. PT does not think he will require any services at discharge.    LOS: 9 days    Antony Odea, PA-C 05/14/2021

## 2021-05-14 NOTE — Progress Notes (Signed)
Patient converted to rapid atrial fib on monitor, rate up to 142. Patient still with persistent hiccups. Vital signs obtained see flow sheet and EKG obtained. [place] Cutter [facility] made aware and orders received. Lennox Leikam, Bettina Gavia RN

## 2021-05-14 NOTE — Progress Notes (Signed)
Not sleeping well.  Feels fatigued.  Feels he is not making progress as fast as he would like. No atrial fibs. Reassured that his recovery is on pace. [facility] will sign off.   Medication Recommendations: Per T CTS Other recommendations (labs, testing, etc): None Follow up as an outpatient: 2-4-week OV with cardiology

## 2021-05-15 ENCOUNTER — Other Ambulatory Visit (HOSPITAL_COMMUNITY): Payer: Self-pay

## 2021-05-15 DIAGNOSIS — R066 Hiccough: Secondary | ICD-10-CM | POA: Diagnosis not present

## 2021-05-15 LAB — MAGNESIUM: Magnesium: 1.8 mg/dL (ref 1.7–2.4)

## 2021-05-15 LAB — CBC
HCT: 27.9 % — ABNORMAL LOW (ref 39.0–52.0)
Hemoglobin: 9.6 g/dL — ABNORMAL LOW (ref 13.0–17.0)
MCH: 30.3 pg (ref 26.0–34.0)
MCHC: 34.4 g/dL (ref 30.0–36.0)
MCV: 88 fL (ref 80.0–100.0)
Platelets: 218 10*3/uL (ref 150–400)
RBC: 3.17 MIL/uL — ABNORMAL LOW (ref 4.22–5.81)
RDW: 13.5 % (ref 11.5–15.5)
WBC: 13 10*3/uL — ABNORMAL HIGH (ref 4.0–10.5)
nRBC: 0 % (ref 0.0–0.2)

## 2021-05-15 LAB — BASIC METABOLIC PANEL
Anion gap: 9 (ref 5–15)
BUN: 18 mg/dL (ref 8–23)
CO2: 33 mmol/L — ABNORMAL HIGH (ref 22–32)
Calcium: 8.2 mg/dL — ABNORMAL LOW (ref 8.9–10.3)
Chloride: 86 mmol/L — ABNORMAL LOW (ref 98–111)
Creatinine, Ser: 1.16 mg/dL (ref 0.61–1.24)
GFR, Estimated: >60 mL/min (ref >60)
Glucose, Bld: 135 mg/dL — ABNORMAL HIGH (ref 70–99)
Potassium: 3.8 mmol/L (ref 3.5–5.1)
Sodium: 128 mmol/L — ABNORMAL LOW (ref 135–145)

## 2021-05-15 MED ORDER — ASPIRIN EC 81 MG PO TBEC
81.0000 mg | DELAYED_RELEASE_TABLET | Freq: Every day | ORAL | Status: DC
  Administered 2021-05-16 – 2021-05-17 (×2): 81 mg via ORAL
  Filled 2021-05-15 (×2): qty 1

## 2021-05-15 MED ORDER — APIXABAN 5 MG PO TABS
5.0000 mg | ORAL_TABLET | Freq: Two times a day (BID) | ORAL | Status: DC
  Administered 2021-05-15 – 2021-05-17 (×4): 5 mg via ORAL
  Filled 2021-05-15 (×4): qty 1

## 2021-05-15 NOTE — TOC Benefit Eligibility Note (Signed)
Patient Advocate Encounter ° °Insurance verification completed.   ° °The patient is currently admitted and upon discharge could be taking Eliquis 5 mg. ° °The current 30 day co-pay is, $45.00.  ° °The patient is insured through [employer] Part D  ° ° ° °Calven Gilkes Dorminey, CPhT °Pharmacy Patient Advocate Specialist °[facility] Patient Advocate Team °Direct Number: [phone]  Fax: [phone] ° ° ° ° ° °  °

## 2021-05-15 NOTE — Progress Notes (Signed)
Mobility Specialist: Progress Note   05/15/21 1141  Mobility  Bed Position Chair  Activity Ambulated with assistance in hallway  Level of Assistance Minimal assist, patient does 75% or more  Assistive Device Front wheel walker  Distance Ambulated (ft) 240 ft (120'x2)  Activity Response Tolerated fair  $Mobility charge 1 Mobility   Pre-Mobility on 4 L/min [place]: 81 HR, 96% SpO2 During Mobility:    On 4 L/min [place]: 87 HR, 86% SpO2    On 6 L/min [place]: 87 HR, 94% SpO2 Post-Mobility on 4 L/min [place]: 88 HR, 121/86 BP, 96% SpO2  Pt required minA for bed mobility as well as to stand. Verbal cues needed throughout ambulation for RW direction to avoid objects in the hallway. Stopped x1 for long seated break d/t fatigue and SOB. Increased O2 flow to 6 L/min  to finish walk. Pt to recliner after walk with call bell at his side and family present in the room.   [facility] Tilly Pernice Mobility Specialist Mobility Specialist 4 [place]: [phone] Mobility Specialist 2 [place] and [place]: [phone]

## 2021-05-15 NOTE — Progress Notes (Signed)
CARDIAC REHAB PHASE I   Came to walk pt. Pt prepared to be walked with Gerald Stabs from the mobility team. Will continue to follow.   Sheppard Plumber, MS, ACSM-CEP 05/15/2021 11:09 AM

## 2021-05-15 NOTE — Progress Notes (Addendum)
[address]       [place] [address]             [phone]      9 Days Post-Op Procedure(s) (LRB): CORONARY ARTERY BYPASS GRAFTING (CABG) x3 on pump using left internal mammary artery and right endoscopic greater saphenous vein conduit. (N/A) TRANSESOPHAGEAL ECHOCARDIOGRAM (TEE) (N/A) APPLICATION OF CELL SAVER ENDOVEIN HARVEST OF GREATER SAPHENOUS VEIN (Right) Subjective:  Late entry. [place] was seen at 07:10 this morning.  Having hiccups again this morning and says they persisted most of the night.   O2 at 2-3L/[place].   Objective: Vital signs in last 24 hours: Temp:  [97.4 F (36.3 C)-98.3 F (36.8 C)] 97.9 F (36.6 C) (01/27 1230) Pulse Rate:  [80-108] 88 (01/27 1356) Cardiac Rhythm: Normal sinus rhythm (01/27 0732) Resp:  [16-23] 18 (01/27 1356) BP: (84-115)/(59-75) 103/63 (01/27 1356) SpO2:  [94 %-96 %] 95 % (01/27 1356)    Intake/Output from previous day: 01/26 0701 - 01/27 0700 In: 377 [P.O.:377] Out: 3005 [Urine:3005] Intake/Output this shift: Total I/O In: 490 [P.O.:480; I.V.:10] Out: 1575 [Urine:1575]  General appearance: alert, cooperative, and mild distress due to incisional soreness.and hiccups Neurologic: intact Heart: had more a-fib last night and again this morning. Currently in SR.   Lungs: breath sounds clear, diminished. O2 sats adequate on 2L/[place] O2. Cough is dry and non-productive.  CXR showing bilateral opacities in upper lung zones.  Abdomen: soft, non-tender.  Extremities: 1+ LE edema. The RLE EVH incision is intact and dry.  Wound: the sternotomy incision is well approximated and dry.   Lab Results: Recent Labs    05/14/21 0148 05/15/21 0117  WBC 12.4* 13.0*  HGB 9.6* 9.6*  HCT 28.7* 27.9*  PLT 174 218    BMET:  Recent Labs    05/14/21 0148 05/15/21 0117  NA 129* 128*  K 4.8 3.8  CL 92* 86*  CO2 25 33*  GLUCOSE 92 135*  BUN 20 18  CREATININE 1.04 1.16  CALCIUM 8.4* 8.2*     PT/INR: No results  for input(s): LABPROT, INR in the last 72 hours. ABG    Component Value Date/Time   PHART 7.347 (L) 05/08/2021 0323   HCO3 22.7 05/08/2021 0323   TCO2 22 05/08/2021 1407   ACIDBASEDEF 3.0 (H) 05/08/2021 0323   O2SAT 46.7 05/11/2021 0706   CBG (last 3)  Recent Labs    05/12/21 1605 05/12/21 2101 05/13/21 0600  GLUCAP 105* 141* 116*      Assessment/Plan: S/P Procedure(s) (LRB): CORONARY ARTERY BYPASS GRAFTING (CABG) x3 on pump using left internal mammary artery and right endoscopic greater saphenous vein conduit. (N/A) TRANSESOPHAGEAL ECHOCARDIOGRAM (TEE) (N/A) APPLICATION OF CELL SAVER ENDOVEIN HARVEST OF GREATER SAPHENOUS VEIN (Right)  -POD-9 CABG x 3 presenting with acute STEMI . EF 45% with global hypokinesis on echo 1/25, very small pleural effusion. On ASA, metoprolol, and Crestor. BP stable.  -Paroxysmal a-fib- recurred last night, given another IV bolus of amiodarone but had converted back to SR just before it was given.. Continue amio and metoprolol and will need to start oral anticoagulation.   -GI- persistent hiccups. No improvement with thorazine yesterday so he was transitioned to gabapentin 300mg  TID.  Requested GI consult.   -PULM- CXR still showing opacities in bilateral upper lung zones. Mild leukocytosis improving. PO cefdinir started 1/25. Need for pulmonary hygiene and ambulation discussed again.   -Hyponatremia-Suspect related to diuresis, Na+ relatively stable. Monitoring.   -Disposition-anticipate  discharge to home when independent with mobility and O2 weaned off. PT does not think he will require any services at discharge.    LOS: 10 days    Antony Odea, Hershal Coria [phone] 05/15/2021  patient examined and medical record reviewed,agree with above note. Dahlia Byes 05/15/2021

## 2021-05-15 NOTE — Progress Notes (Addendum)
Physical Therapy Treatment Patient Details Name: LINDA BIEHN MRN: [id number] DOB: 01-02-1951 Today's Date: 05/15/2021   History of Present Illness Pt is a 71 y.o. male admitted 05/10/2021 with severe chest pain. S/p emergent CABG x3 1/18. ETT 1/18-1/19. PMH includes prostate CA.    PT Comments    Pt hiccupping with spasms on arrival. Appearance of fatigue, but agreed to ambulation.  Emphasis on scooting and transfer safety plus gait stability/stamina.  Pt quite fatigued at end of the session, having staggered somewhat out of control on several occasions.  SpO2 needed to be raised higher than 4L to maintain SpO2 over 90%.   Recommendations for follow up therapy are one component of a multi-disciplinary discharge planning process, led by the attending physician.  Recommendations may be updated based on patient status, additional functional criteria and insurance authorization.  Follow Up Recommendations  Home health PT     Assistance Recommended at Discharge Intermittent Supervision/Assistance  Patient can return home with the following A little help with walking and/or transfers;A little help with bathing/dressing/bathroom;Assistance with cooking/housework;Direct supervision/assist for medications management;Assist for transportation;Help with stairs or ramp for entrance   Equipment Recommendations  Other (comment) (TBD)    Recommendations for Other Services       Precautions / Restrictions Precautions Precautions: Fall;Sternal Precaution Comments: watch SpO2 (does not wear baseline)     Mobility  Bed Mobility               General bed mobility comments: OOB on arrival and left in chair after gait trial    Transfers Overall transfer level: Needs assistance Equipment used: None Transfers: Sit to/from Stand Sit to Stand: Min assist           General transfer comment: cues for getting prepped for stand at edge of chair, assist forward and minor boost.     Ambulation/Gait Ambulation/Gait assistance: Min assist, Mod assist Gait Distance (Feet): 240 Feet (with a long rest break in standing at 120 feet.) Assistive device: Rolling walker (2 wheels) Gait Pattern/deviations: Step-through pattern   Gait velocity interpretation: 1.31 - 2.62 ft/sec, indicative of limited community ambulator   General Gait Details: unsteady gait with stagger to right on several occasions.  Several episodes of not controlling speed.  Notably fatigue and needed several standing breaks to recover, regain posture and proximity to the RW before restarting.   Stairs             Wheelchair Mobility    Modified Rankin (Stroke Patients Only)       Balance Overall balance assessment: Needs assistance Sitting-balance support: No upper extremity supported, Feet supported Sitting balance-Leahy Scale: Good       Standing balance-Leahy Scale: Fair Standing balance comment: statically, not able to ambulate today without UE support                            Cognition Arousal/Alertness: Awake/alert Behavior During Therapy: WFL for tasks assessed/performed, Flat affect Overall Cognitive Status: Within Functional Limits for tasks assessed                                          Exercises      General Comments General comments (skin integrity, edema, etc.): Needed to raise O2 during gait to maintain SpO2 >90%  HR maintained between 88 and 98 bpm during gait.  Pertinent Vitals/Pain Pain Assessment Pain Assessment: Faces Faces Pain Scale: Hurts little more Pain Location: abdomen with hiccups Pain Intervention(s): Monitored during session    Home Living                          Prior Function            PT Goals (current goals can now be found in the care plan section) Acute Rehab PT Goals Patient Stated Goal: Return home PT Goal Formulation: With patient Time For Goal Achievement: 05/23/21 Potential  to Achieve Goals: Good Progress towards PT goals: Progressing toward goals    Frequency    Min 3X/week      PT Plan Current plan remains appropriate    Co-evaluation              AM-PAC PT "6 Clicks" Mobility   Outcome Measure  Help needed turning from your back to your side while in a flat bed without using bedrails?: None Help needed moving from lying on your back to sitting on the side of a flat bed without using bedrails?: None Help needed moving to and from a bed to a chair (including a wheelchair)?: A Little Help needed standing up from a chair using your arms (e.g., wheelchair or bedside chair)?: A Little Help needed to walk in hospital room?: A Little Help needed climbing 3-5 steps with a railing? : A Lot 6 Click Score: 19    End of Session   Activity Tolerance: Patient tolerated treatment well;Patient limited by fatigue Patient left: in chair;with call bell/phone within reach;with family/visitor present Nurse Communication: Mobility status PT Visit Diagnosis: Other abnormalities of gait and mobility (R26.89);Muscle weakness (generalized) (M62.81)     Time: 1526-1600 PT Time Calculation (min) (ACUTE ONLY): 34 min  Charges:  $Gait Training: 8-22 mins $Therapeutic Activity: 8-22 mins                     05/15/2021  Ginger Carne., PT Acute Rehabilitation Services [phone]  (pager) [phone]  (office)   Tessie Fass Lamont Glasscock 05/15/2021, 4:22 PM

## 2021-05-15 NOTE — Consult Note (Signed)
Referring Provider: Dr. Darcey Nora, CTS Primary Care Physician:  [name], MD Primary Gastroenterologist:  Althia Forts  Reason for Consultation:  Hiccups  HPI: Randall Hodges is a 71 y.o. male with limited PMH who presented to [facility] on 05/12/2021 with ACS/STEMI.  Had CABG x 3 on 05/17/2021 that was uneventful.  Developed hiccups the day after surgery.  Have been severe and causing him a lot of fatigue and discomfort, but not occurring while he is sleeping,  He is on pantoprazole 40 mg BID and they tried thorazine without much improvement.  Baclofen seemed to help but caused hallucinations.  He was started on gabapentin 300 mg TID last evening.  Says that so far hiccups have been less today.  His wife agrees and he did not have hiccups at all while I was with them.  He describes two other episodes of something similar in 2022.  He says that after two separate dental procedures where he received anesthesia that he had hiccups for 2-3 days following those.  Was taking pepcid or omeprazole OTC prn for occasional heartburn/reflux as an outpatient.  Had colonoscopy may years ago.   Past Medical History:  Diagnosis Date   Allergic rhinitis    Prostate cancer [facility])     Past Surgical History:  Procedure Laterality Date   CORONARY ARTERY BYPASS GRAFT N/A 05/10/2021   Procedure: CORONARY ARTERY BYPASS GRAFTING (CABG) x3 on pump using left internal mammary artery and right endoscopic greater saphenous vein conduit.;  Surgeon: Dahlia Byes, MD;  Location: [facility];  Service: Open Heart Surgery;  Laterality: N/A;   ENDOVEIN HARVEST OF GREATER SAPHENOUS VEIN Right 04/30/2021   Procedure: ENDOVEIN HARVEST OF GREATER SAPHENOUS VEIN;  Surgeon: Dahlia Byes, MD;  Location: [facility];  Service: Open Heart Surgery;  Laterality: Right;   LEFT HEART CATH AND CORONARY ANGIOGRAPHY N/A 05/01/2021   Procedure: LEFT HEART CATH AND CORONARY ANGIOGRAPHY;  Surgeon: Belva Crome, MD;  Location: [facility];   Service: Cardiovascular;  Laterality: N/A;   PROSTATE BIOPSY     SHOULDER SURGERY     TEE WITHOUT CARDIOVERSION N/A 05/01/2021   Procedure: TRANSESOPHAGEAL ECHOCARDIOGRAM (TEE);  Surgeon: Dahlia Byes, MD;  Location: [facility];  Service: Open Heart Surgery;  Laterality: N/A;    Prior to Admission medications   Medication Sig Start Date End Date Taking? Authorizing Provider  albuterol (VENTOLIN HFA) 108 (90 Base) MCG/ACT inhaler Inhale 1-2 puffs into the lungs every 4 (four) hours as needed for wheezing or shortness of breath. 04/07/21  Yes [provider]  aspirin EC 81 MG tablet Take 1 tablet (81 mg total) by mouth daily. Swallow whole. 07/18/20  Yes [name], MD  cetirizine (ZYRTEC) 10 MG tablet Take 10 mg by mouth daily.   Yes [provider]  Famotidine-Ca Carb-Mag Hydrox (PEPCID COMPLETE PO) Take 1 capsule by mouth daily.   Yes [provider]  metoprolol tartrate (LOPRESSOR) 100 MG tablet Take 1 tablet (100 mg total) by mouth 2 (two) times daily. 04/30/21 07/29/21 Yes Minus Breeding, MD  omeprazole (PRILOSEC) 40 MG capsule Take 40 mg by mouth daily as needed (heartburn/indigestion). 03/09/18   [provider]  rosuvastatin (CRESTOR) 10 MG tablet Take 1 tablet (10 mg total) by mouth daily. 12/11/20 06/27/21  Leonie Man, MD    Current Facility-Administered Medications  Medication Dose Route Frequency Provider Last Rate Last Admin   amiodarone (PACERONE) tablet 200 mg  200 mg Oral BID Dahlia Byes, MD  200 mg at 05/15/21 0934   aspirin EC tablet 325 mg  325 mg Oral Daily John Giovanni, PA-C   325 mg at 05/15/21 1610   Or   aspirin chewable tablet 324 mg  324 mg Per Tube Daily Gold, Wayne E, PA-C   324 mg at 05/07/21 9604   cefdinir (OMNICEF) capsule 300 mg  300 mg Oral Q12H Dahlia Byes, MD   300 mg at 05/15/21 0934   chlorhexidine (PERIDEX) 0.12 % solution 15 mL  15 mL Mouth Rinse BID Dahlia Byes, MD   15 mL at 05/15/21 0936    feeding supplement (ENSURE ENLIVE / ENSURE PLUS) liquid 237 mL  237 mL Oral TID WC Dahlia Byes, MD   237 mL at 05/14/21 1642   furosemide (LASIX) injection 40 mg  40 mg Intravenous Daily Dahlia Byes, MD   40 mg at 05/15/21 0935   gabapentin (NEURONTIN) capsule 300 mg  300 mg Oral TID Roddenberry, Myron G, PA-C   300 mg at 05/15/21 0934   guaiFENesin (MUCINEX) 12 hr tablet 1,200 mg  1,200 mg Oral BID Roddenberry, Myron G, PA-C   1,200 mg at 05/15/21 5409   MEDLINE mouth rinse  15 mL Mouth Rinse q12n4p Dahlia Byes, MD   15 mL at 05/14/21 1642   metolazone (ZAROXOLYN) tablet 5 mg  5 mg Oral Daily Dahlia Byes, MD   5 mg at 05/15/21 0935   metoprolol tartrate (LOPRESSOR) tablet 25 mg  25 mg Oral BID Melrose Nakayama, MD   25 mg at 05/15/21 0935   Or   metoprolol tartrate (LOPRESSOR) 25 mg/10 mL oral suspension 25 mg  25 mg Per Tube BID Melrose Nakayama, MD       metoprolol tartrate (LOPRESSOR) injection 2.5-5 mg  2.5-5 mg Intravenous Q2H PRN Jadene Pierini E, PA-C   4 mg at 05/15/21 8119   morphine 2 MG/ML injection 2 mg  2 mg Intravenous Q2H PRN Antony Odea, PA-C   2 mg at 05/15/21 0456   ondansetron (ZOFRAN) injection 4 mg  4 mg Intravenous Q6H PRN Jadene Pierini E, PA-C   4 mg at 05/10/21 2029   pantoprazole (PROTONIX) EC tablet 40 mg  40 mg Oral BID Roddenberry, Myron G, PA-C   40 mg at 05/15/21 0934   rosuvastatin (CRESTOR) tablet 10 mg  10 mg Oral Daily [place] Nakayama, MD   10 mg at 05/15/21 0936   sodium chloride flush (NS) 0.9 % injection 10-40 mL  10-40 mL Intracatheter Q12H Dahlia Byes, MD   10 mL at 05/14/21 2028   sodium chloride flush (NS) 0.9 % injection 10-40 mL  10-40 mL Intracatheter PRN Dahlia Byes, MD       sodium chloride flush (NS) 0.9 % injection 3 mL  3 mL Intravenous Q12H Gold, Wayne E, PA-C   3 mL at 05/15/21 0948   sodium chloride flush (NS) 0.9 % injection 3 mL  3 mL Intravenous PRN Jadene Pierini E, PA-C   3 mL at 05/10/21 2229    traMADol (ULTRAM) tablet 50-100 mg  50-100 mg Oral Q4H PRN Jadene Pierini E, PA-C   100 mg at 05/14/21 1255    Allergies as of 05/01/2021 - Review Complete 04/27/2021  Allergen Reaction Noted   Amoxicillin-pot clavulanate  08/19/2020   Atorvastatin Other (See Comments) 12/11/2020   Plant derived enzymes  07/16/2020   Trichophyton Other (See Comments) 07/16/2020    Family History  Problem Relation Age of Onset  Diabetes Father    Prostate cancer Father 70       had an enlarged prostate and had it removed   Hypertension Paternal Grandmother    Prostate cancer Brother 48       metastatic to bone   Breast cancer Neg Hx    Colon cancer Neg Hx    Pancreatic cancer Neg Hx     Social History   Socioeconomic History   Marital status: Married    Spouse name: Not on file   Number of children: 2   Years of education: Not on file   Highest education level: Not on file  Occupational History    Comment: full time   Tobacco Use   Smoking status: Never   Smokeless tobacco: Never  Vaping Use   Vaping Use: Never used  Substance and Sexual Activity   Alcohol use: Yes    Comment: occ   Drug use: Never   Sexual activity: Not on file  Other Topics Concern   Not on file  Social History Narrative   Not on file   Social Determinants of Health   Financial Resource Strain: Not on file  Food Insecurity: Not on file  Transportation Needs: Not on file  Physical Activity: Not on file  Stress: Not on file  Social Connections: Not on file  Intimate Partner Violence: Not on file    Review of Systems: ROS is O/W negative except as mentioned in HPI.  Physical Exam: Vital signs in last 24 hours: Temp:  [97.4 F (36.3 C)-98.3 F (36.8 C)] 97.7 F (36.5 C) (01/27 0832) Pulse Rate:  [80-108] 89 (01/27 0832) Resp:  [18-23] 18 (01/27 0832) BP: (102-125)/(59-75) 111/75 (01/27 0832) SpO2:  [92 %-96 %] 94 % (01/27 0832) Last BM Date: 05/12/21 General:  Alert, very weak and fatigued  appearing. Head:  Normocephalic and atraumatic. Eyes:  Sclera clear, no icterus.  Conjunctiva pink. Ears:  Normal auditory acuity. Mouth:  No deformity or lesions.   Lungs:  Clear throughout to auscultation.   No wheezes, crackles, or rhonchi.  Heart:  Regular rate and rhythm; no murmurs, clicks, rubs, or gallops. Abdomen:  Soft,nontender, BS active,nonpalp mass or hsm.   Rectal:  Deferred  Msk:  Symmetrical without gross deformities. Pulses:  Normal pulses noted. Extremities:  Without clubbing or edema.  RLE incision noted.  Neurologic:  Alert and  oriented x4;  grossly normal neurologically. Skin:  Intact without significant lesions or rashes. Psych:  Alert and cooperative. Normal mood and affect.  Intake/Output from previous day: 01/26 0701 - 01/27 0700 In: 377 [P.O.:377] Out: 3005 [Urine:3005] Intake/Output this shift: Total I/O In: -  Out: 700 [Urine:700]  Lab Results: Recent Labs    05/13/21 0056 05/14/21 0148 05/15/21 0117  WBC 14.5* 12.4* 13.0*  HGB 9.6* 9.6* 9.6*  HCT 27.8* 28.7* 27.9*  PLT 153 174 218   BMET Recent Labs    05/13/21 0056 05/14/21 0148 05/15/21 0117  NA 127* 129* 128*  K 4.4 4.8 3.8  CL 91* 92* 86*  CO2 29 25 33*  GLUCOSE 115* 92 135*  BUN 20 20 18   CREATININE 1.07 1.04 1.16  CALCIUM 8.2* 8.4* 8.2*   Studies/Results: DG Chest 2 View  Result Date: 05/14/2021 CLINICAL DATA:  Postoperative CABG, 7 days ago with weakness. EXAM: CHEST - 2 VIEW COMPARISON:  May 13, 2021. FINDINGS: EKG leads project over the chest. Median sternotomy changes and CABG. Stable appearance of cardiomediastinal contours accounting for rotation on the  current study. Interstitial and airspace opacities are similar with upper lobe predominance when compared to previous imaging. There is further opacification however at the LEFT lung base when compared to the previous study and suspected bilateral effusions with blunting of LEFT and RIGHT costodiaphragmatic sulci have  developed since previous imaging. On limited assessment there is no acute skeletal process. IMPRESSION: 1. Persistent interstitial and airspace opacities with upper lobe predominance. 2. Worsening basilar airspace process on the LEFT and enlarging pleural fluid collections bilaterally. 3. Findings remain concerning for pneumonia with worsening. Would also correlate with any signs of heart failure. Electronically Signed   By: Zetta Bills M.D.   On: 05/14/2021 10:10    IMPRESSION:  *Hiccups:  These started the day after his CABG.  He describes two episodes in 2022 of similar hiccups that occurred after receiving anesthesia for dental work.  Those resolved after 2-3 days.  ? Phrenic nerve irritation during surgery.  ? Related to anesthesia.  Is on pantoprazole 40 mg BID.  No improvement with thorazine.  Baclofen maybe helps some but cause hallucinations.  Started gabapentin 300 mg TID last evening. *STEMI:  S/p CABG x 3 POD #9  PLAN: -We discussed performing UGI series to rule out gastric lesion, esophagitis, GERD, etc as cause of his hiccups, but he would like to hold off for now.  He just started gabapentin last night and feels that the hiccups and spasms seem to be a little better so far today.  If symptoms continue then that is what we would recommend to evaluate for now and defer EGD.   -Continue pantoprazole 40 mg BID>  Labria Wos D. Aideen Fenster  05/15/2021, 11:59 AM

## 2021-05-15 NOTE — Discharge Instructions (Signed)

## 2021-05-15 NOTE — Progress Notes (Signed)
Patient went back into atrial fibrillation with HR 114-130. PRN IV metoprolol administered 4mg . Patient converted back to NSR Hr 84. Fuller Canada, RN

## 2021-05-15 NOTE — Care Management Important Message (Signed)
Important Message  Patient Details  Name: Randall Hodges MRN: [id number] Date of Birth: [birth year]   Medicare Important Message Given:  Yes     [name], [name] 05/15/2021, 10:19 AM

## 2021-05-16 DIAGNOSIS — I5043 Acute on chronic combined systolic (congestive) and diastolic (congestive) heart failure: Secondary | ICD-10-CM

## 2021-05-16 DIAGNOSIS — R066 Hiccough: Secondary | ICD-10-CM | POA: Diagnosis not present

## 2021-05-16 DIAGNOSIS — I249 Acute ischemic heart disease, unspecified: Secondary | ICD-10-CM | POA: Diagnosis not present

## 2021-05-16 MED ORDER — POTASSIUM CHLORIDE CRYS ER 20 MEQ PO TBCR
40.0000 meq | EXTENDED_RELEASE_TABLET | Freq: Every day | ORAL | Status: DC
  Administered 2021-05-16 – 2021-05-17 (×2): 40 meq via ORAL
  Filled 2021-05-16 (×2): qty 2

## 2021-05-16 MED ORDER — BACLOFEN 5 MG HALF TABLET
5.0000 mg | ORAL_TABLET | Freq: Three times a day (TID) | ORAL | Status: DC
  Filled 2021-05-16 (×2): qty 1

## 2021-05-16 MED ORDER — CARVEDILOL 3.125 MG PO TABS
3.1250 mg | ORAL_TABLET | Freq: Two times a day (BID) | ORAL | Status: DC
  Administered 2021-05-16: 3.125 mg via ORAL
  Filled 2021-05-16: qty 1

## 2021-05-16 MED ORDER — SACUBITRIL-VALSARTAN 24-26 MG PO TABS
1.0000 | ORAL_TABLET | Freq: Two times a day (BID) | ORAL | Status: DC
  Administered 2021-05-16 (×2): 1 via ORAL
  Filled 2021-05-16: qty 1

## 2021-05-16 MED ORDER — COLCHICINE 0.6 MG PO TABS
0.6000 mg | ORAL_TABLET | Freq: Every day | ORAL | Status: DC
  Administered 2021-05-16 – 2021-05-17 (×2): 0.6 mg via ORAL
  Filled 2021-05-16: qty 1

## 2021-05-16 MED ORDER — METOPROLOL TARTRATE 25 MG PO TABS
25.0000 mg | ORAL_TABLET | Freq: Two times a day (BID) | ORAL | Status: DC
  Administered 2021-05-16: 25 mg via ORAL
  Filled 2021-05-16: qty 1

## 2021-05-16 MED ORDER — FUROSEMIDE 10 MG/ML IJ SOLN
40.0000 mg | Freq: Two times a day (BID) | INTRAMUSCULAR | Status: AC
  Administered 2021-05-16: 40 mg via INTRAVENOUS
  Filled 2021-05-16: qty 4

## 2021-05-16 MED ORDER — METOPROLOL TARTRATE 25 MG/10 ML ORAL SUSPENSION: 37.5000 mg | Freq: Two times a day (BID) | ORAL | Status: DC

## 2021-05-16 MED ORDER — METOPROLOL TARTRATE 25 MG PO TABS: 37.5000 mg | ORAL_TABLET | Freq: Two times a day (BID) | ORAL | Status: DC

## 2021-05-16 MED ORDER — DIAZEPAM 2 MG PO TABS
2.0000 mg | ORAL_TABLET | Freq: Two times a day (BID) | ORAL | Status: DC
  Administered 2021-05-16 – 2021-05-17 (×3): 2 mg via ORAL
  Filled 2021-05-16 (×3): qty 1

## 2021-05-16 NOTE — Progress Notes (Signed)
Pt assisted to ambulate in hall using RW and 6LO2.  Approx 300 ft.  Toleratd fairly well, only one standing rest.  Gait unsteady at times. To bathroom after walk with successful BM.  Unfortunately sample not collected before Pt flushed.  To recliner for dinner with call bell and phone in reach.  Will cont plan of care.

## 2021-05-16 NOTE — Progress Notes (Addendum)
[address]       [place] [address]             [phone]      10 Days Post-Op Procedure(s) (LRB): CORONARY ARTERY BYPASS GRAFTING (CABG) x3 on pump using left internal mammary artery and right endoscopic greater saphenous vein conduit. (N/A) TRANSESOPHAGEAL ECHOCARDIOGRAM (TEE) (N/A) APPLICATION OF CELL SAVER ENDOVEIN HARVEST OF GREATER SAPHENOUS VEIN (Right)  Subjective:  Patient awoken from sleep.  States hiccups aren't improved much, however he did not hiccup at all during our interaction.  Still on oxygen at 4-5 L this morning.  Objective: Vital signs in last 24 hours: Temp:  [97.5 F (36.4 C)-97.9 F (36.6 C)] 97.5 F (36.4 C) (01/28 0300) Pulse Rate:  [82-95] 87 (01/28 0300) Cardiac Rhythm: Atrial fibrillation (01/27 2106) Resp:  [16-20] 19 (01/28 0300) BP: (84-144)/(59-80) 116/66 (01/28 0300) SpO2:  [90 %-96 %] 92 % (01/28 0300) Weight:  [82.3 kg] 82.3 kg (01/28 0550)  Intake/Output from previous day: 01/27 0701 - 01/28 0700 In: 1050 [P.O.:840; I.V.:210] Out: 2725 [Urine:2725] Intake/Output this shift: No intake/output data recorded.  General appearance: alert, cooperative, and no distress Heart: regular rate and rhythm Lungs: diminished breath sounds bibasilar Abdomen: soft, non-tender; bowel sounds normal; no masses,  no organomegaly Extremities: edema trace Wound: clean and dry  Lab Results: Recent Labs    05/14/21 0148 05/15/21 0117  WBC 12.4* 13.0*  HGB 9.6* 9.6*  HCT 28.7* 27.9*  PLT 174 218   BMET:  Recent Labs    05/14/21 0148 05/15/21 0117  NA 129* 128*  K 4.8 3.8  CL 92* 86*  CO2 25 33*  GLUCOSE 92 135*  BUN 20 18  CREATININE 1.04 1.16  CALCIUM 8.4* 8.2*    PT/INR: No results for input(s): LABPROT, INR in the last 72 hours. ABG    Component Value Date/Time   PHART 7.347 (L) 05/08/2021 0323   HCO3 22.7 05/08/2021 0323   TCO2 22 05/08/2021 1407   ACIDBASEDEF 3.0 (H) 05/08/2021 0323   O2SAT 46.7  05/11/2021 0706   CBG (last 3)  No results for input(s): GLUCAP in the last 72 hours.  Assessment/Plan: S/P Procedure(s) (LRB): CORONARY ARTERY BYPASS GRAFTING (CABG) x3 on pump using left internal mammary artery and right endoscopic greater saphenous vein conduit. (N/A) TRANSESOPHAGEAL ECHOCARDIOGRAM (TEE) (N/A) APPLICATION OF CELL SAVER ENDOVEIN HARVEST OF GREATER SAPHENOUS VEIN (Right)  S/P STEMI, CABG x 3- having issues with Atrial Fibrillation, currently in NSR with PVCs-on Amiodarone 200 mg daily, increase Lopressor to 37.5 mg BID as BP allows, Eliquis initiated yesterday Pulm- remains on oxygen at 4-5 L this morning, weaning as tolerated, CXR showed effusions, opacification bilaterally.. will get repeat CXR in AM to reassess effusions after diuresis.Marland Kitchen if enlarging may need thoracentesis Renal- slight increase in creatinine level... due to aggressive diuresis with Zaroxolyn and Lasix, K is at 3.8 will supplement GI- persistent Hiccoughs... GI has been consulted, currently on Neurontin as baclofen caused hallucinations and Thorazine did not help.... he states they are not any better however, no hiccoughs observed during interaction this morning... GI has requested UGI study for today Hyponatremia- down to 128, due to diuretics, monitor Deconditioning, PT recs home health orders placed Dispo- patient in NSR with PVCS will titrate BB as BP allows on Eliquis for stroke prevention, continues to require oxygen this morning was on 5L, continue diuresis and wean oxygen as able... will repeat CXR in AM, slight bump in creatinine  due to diuretics these are due to complete today, GI workup for hiccoughs, monitor Na level   LOS: 11 days   Ellwood Handler, PA-C 05/16/2021 [place] with signs of fluid retension , possible CHF on CXR - will hold increasing beta blocker for afib and ask for cardiology to asses meds for CHF  patient examined and medical record reviewed,agree with above note. Dahlia Byes 05/16/2021

## 2021-05-16 NOTE — Progress Notes (Addendum)
° ° ° °  [facility] Progress Note  CC:  Hiccups  Assessment / Plan: Persistent post-operative hiccups without focal neurologic symptoms. Not responding to PPI, chlorpromazine (although he only received a couple of doses), or gabapentin (day 2). He had hallucinations after a trial of baclofen.    Recommend: - Continue pantoprazole BID - Continue gabapentin - Add valium 2 mg BID today, can titrate up as needed if it is well tolerated as we are starting at a very low dose - May benefit from a more extended trial of chlorpromazine - Rehab staff recommended a trial of peanut butter and/or ice cream as these have worked for her in the past. It's worth a try!  - Awaiting UGI - Testing for H pylori to be obtained with the next stool  Subjective: Persistent hiccups are unchanged. No new GI complaints.   Objective:  Vital signs in last 24 hours: Temp:  [97.5 F (36.4 C)-98.2 F (36.8 C)] 98.2 F (36.8 C) (01/28 1011) Pulse Rate:  [87-97] 97 (01/28 1011) Resp:  [18-20] 20 (01/28 1011) BP: (103-144)/(63-80) 130/67 (01/28 1011) SpO2:  [90 %-95 %] 92 % (01/28 1011) Weight:  [82.3 kg] 82.3 kg (01/28 0550) Last BM Date: 05/14/21 General:   Alert, in NAD, sitting at the bedside about to walk with therapy Abdomen:  Soft. Nontender. Nondistended. Normal bowel sounds. No rebound or guarding. Neurologic:  Alert and  oriented x4;  grossly normal neurologically. Psych:  Alert and cooperative. Normal mood and affect.  Lab Results: Recent Labs    05/14/21 0148 05/15/21 0117  WBC 12.4* 13.0*  HGB 9.6* 9.6*  HCT 28.7* 27.9*  PLT 174 218   BMET Recent Labs    05/14/21 0148 05/15/21 0117  NA 129* 128*  K 4.8 3.8  CL 92* 86*  CO2 25 33*  GLUCOSE 92 135*  BUN 20 18  CREATININE 1.04 1.16  CALCIUM 8.4* 8.2*     LOS: 11 days   [place]  05/16/2021, 1:29 PM

## 2021-05-16 NOTE — Progress Notes (Signed)
CARDIAC REHAB PHASE I   PRE:  Rate/Rhythm: 86 SR with occ PVCs    BP: sitting 99/65    SaO2: 92 3L  MODE:  Ambulation: 330 ft   POST:  Rate/Rhythm: 98 SR    BP: sitting 109/63      SaO2: 91 6L  Pt on EOB on arrival, eating and hiccupping. Took considerable time to prepare to ambulate. Stood with min assist, walked with RW and 4L initially. Rest standing x3 for energy conservation. After 210 ft pt  sat and rested. SaO2 86-88 4L therefore increased to 6L. Pt sts he feels better on 6L. Able to stand mostly independent and finish walk back to room, to recliner. Pt intermittently unsteady, swaying. C/o fatigue, SOB, intermittent dizziness.   Practiced IS and flutter in recliner. 1000 ml on IS. His hiccups mostly quit while walking and now while sitting in recliner. Encouraged pt to try to sit upright what he can, to stand intermittently to expand thorax. Encouraged IS and flutter and more walks. Gave ice cream to try when hiccups come back. [place], ACSM 05/16/2021 2:12 PM

## 2021-05-16 NOTE — Progress Notes (Addendum)
Progress Note  Patient Name: Randall Hodges Date of Encounter: 05/16/2021  [facility] Cardiologist: [name], MD   Subjective   Persistent hiccup not improving. Breathing is about the same, also no significant improvement.   Inpatient Medications    Scheduled Meds:  amiodarone  200 mg Oral BID   apixaban  5 mg Oral BID   aspirin EC  81 mg Oral Daily   cefdinir  300 mg Oral Q12H   chlorhexidine  15 mL Mouth Rinse BID   feeding supplement  237 mL Oral TID WC   furosemide  40 mg Intravenous Daily   gabapentin  300 mg Oral TID   guaiFENesin  1,200 mg Oral BID   mouth rinse  15 mL Mouth Rinse q12n4p   metolazone  5 mg Oral Daily   metoprolol tartrate  25 mg Oral BID   pantoprazole  40 mg Oral BID   potassium chloride  40 mEq Oral Daily   rosuvastatin  10 mg Oral Daily   sodium chloride flush  10-40 mL Intracatheter Q12H   sodium chloride flush  3 mL Intravenous Q12H   Continuous Infusions:  PRN Meds: metoprolol tartrate, ondansetron (ZOFRAN) IV, sodium chloride flush, sodium chloride flush, traMADol   Vital Signs    Vitals:   05/15/21 2128 05/15/21 2315 05/16/21 0300 05/16/21 0550  BP: 117/67 (!) 144/80 116/66   Pulse: 95 90 87   Resp: 20 20 19    Temp: (!) 97.5 F (36.4 C) 97.9 F (36.6 C) (!) 97.5 F (36.4 C)   TempSrc: Oral Oral Oral   SpO2: 90% 93% 92%   Weight:    82.3 kg  Height:        Intake/Output Summary (Last 24 hours) at 05/16/2021 0917 Last data filed at 05/16/2021 0400 Gross per 24 hour  Intake 560 ml  Output 2425 ml  Net -1865 ml   Last 3 Weights 05/16/2021 05/13/2021 05/13/2021  Weight (lbs) 181 lb 8 oz 191 lb 5.8 oz 193 lb 2 oz  Weight (kg) 82.328 kg 86.8 kg 87.6 kg      Telemetry    NSR with frequent bigeminy and trigeminy, 1 episode of afib from 9:04 until 9:18AM today - Personally Reviewed  ECG    Atrial fibrillation with RVR - Personally Reviewed  Physical Exam   GEN: frail, constant hiccup  Neck: No JVD Cardiac:  RRR, no murmurs, rubs, or gallops.  Respiratory: Clear to auscultation bilaterally. GI: Soft, nontender, non-distended  MS: No edema; No deformity. Neuro:  Nonfocal  Psych: Normal affect   Labs    High Sensitivity Troponin:   Recent Labs  Lab 05/10/2021 1321 04/30/2021 1518  TROPONINIHS 3 4     Chemistry Recent Labs  Lab 05/13/21 0056 05/14/21 0148 05/15/21 0117  NA 127* 129* 128*  K 4.4 4.8 3.8  CL 91* 92* 86*  CO2 29 25 33*  GLUCOSE 115* 92 135*  BUN 20 20 18   CREATININE 1.07 1.04 1.16  CALCIUM 8.2* 8.4* 8.2*  MG  --   --  1.8  GFRNONAA >60 >60 >60  ANIONGAP 7 12 9     Lipids  Recent Labs  Lab 05/13/21 0056  CHOL 71  TRIG 56  HDL 25*  LDLCALC 35  CHOLHDL 2.8    Hematology Recent Labs  Lab 05/13/21 0056 05/14/21 0148 05/15/21 0117  WBC 14.5* 12.4* 13.0*  RBC 3.12* 3.16* 3.17*  HGB 9.6* 9.6* 9.6*  HCT 27.8* 28.7* 27.9*  MCV  89.1 90.8 88.0  MCH 30.8 30.4 30.3  MCHC 34.5 33.4 34.4  RDW 13.8 13.7 13.5  PLT 153 174 218   Thyroid No results for input(s): TSH, FREET4 in the last 168 hours.  BNPNo results for input(s): BNP, PROBNP in the last 168 hours.  DDimer No results for input(s): DDIMER in the last 168 hours.   Radiology    No results found.  Cardiac Studies   Cath 05/10/2021 Distal left main [place] 111 bifurcation stenosis with 75% left main, 90% ostial to proximal LAD, and 80-90% ostial circumflex (difficult to assess due to heavy calcification). Severe mid circumflex disease with 70% eccentric mid stenosis and second obtuse marginal containing ostial to proximal greater than 80% stenosis.  (Bifurcation [place] 111 Severe calcification in left main and LAD in particular with diffuse 50% narrowing from proximal to mid vessel and tandem 70% stenoses in the mid LAD. Nondominant right coronary Normal LV function.  EF 55%.  LVEDP normal.    Patient Profile     71 y.o. male with PMH of PVCs presented with chest pain. Cardiac cath by Dr. Tamala Julian on  04/30/2021 showed 75% left main, 90% ost to prox LAD, 80-90% ost LCx, 70% mid LCx, 80% OM2, 50% prox to mid LAD, 70% mid LAD, EF 55%. Patient underwent CABG x 3 on 04/20/2021. Post op course complicated afib, treated with amio. CXR showed opacity in bilateral lung, started abx on 1/25 and diuretic. Started on Eliquis due to recurrence of afib.    Assessment & Plan    CAD s/p 3v CABG:  - Cardiac cath by Dr. Tamala Julian on 04/25/2021 showed 75% left main, 90% ost to prox LAD, 80-90% ost LCx, 70% mid LCx, 80% OM2, 50% prox to mid LAD, 70% mid LAD, EF 55%. - CABG x 3 on 05/16/2021 LIMA-LAD, SVG-OM, and SVG-PD  - ASA, metoprolol, and Crestor  Acute respiratory failure: being treated for both CHF and possible PNA. started on cefdinir on 1/25, currently receiving IV lasix with metolazone.  Acute systolic CHF: EF 46% on limited echo 1/25, small circumferential pericardial effusion, prominent septal bounce. - CXR obtained on 1/26 showed persistently interstitial and airspace opacities with upper lobe predominance, worsening basilar airspace process on the left and enlarging pleural fluid collection bilaterally - on exam, patient has trace LE edema, lungs essentially clear, no significant JVD. In the past 24 hours, output -2.7L, intake 1.0L, net -1.6L. Patient seems to be responding to diuretic at this time. Suspect some of his SOB maybe more related to the constant hiccup. Would continue on current IV lasix and metolazone for one more day. May consider repeating the chest x ray today instead of tomorrow to get a idea if opacity is clearing up.   Recurrent post op atrial fibrillation: on Eliquis and amiodarone  Intractable hiccup: followed by GI  Hyponatremia: limit excess free fluid  HLD      For questions or updates, please contact [facility] Please consult [email or web address] for contact info under    Signed, Almyra Deforest, [place]  05/16/2021, 9:17 AM    Patient seen with PA, agree with the above note.   He  diuresed reasonably well yesterday, I/Os net negative 1675.  Creatinine stable 1.16.  BP stable.   In NSR with PACs currently, had some AF overnight.   Main complaint today is intractable hiccups.  He was hiccuping frequently while I was in the room.   General: Uncomfortable with frequent hiccupping.  Neck: JVP 10 cm, no thyromegaly  or thyroid nodule.  Lungs: Clear to auscultation bilaterally with normal respiratory effort. CV: Nondisplaced PMI.  Heart regular S1/S2, no S3/S4, no murmur.  1+ ankle edema.  Abdomen: Soft, nontender, no hepatosplenomegaly, no distention.  Skin: Intact without lesions or rashes.  Neurologic: Alert and oriented x 3.  Psych: Normal affect. Extremities: No clubbing or cyanosis.  HEENT: Normal.   1. CAD: Admitted with unstable angina, cath with severe left main and proximal LAD/LCx disease (nondominant RCA).  CABG x 3 on 1/18 with LIMA-LAD, SVG-OM, SVG-left PDA.   - Continue ASA 81, statin.  2. 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  No chest pain. On exam, he is volume overloaded.  Creatinine stable.  - Lasix 40 mg IV bid with metolazone 5 mg x 1 today.  - Stop metoprolol, start low dose Coreg 3.125 mg bid.  - Has BP room for Entresto, will start 24/26 bid.  - With pericardial abnormality, will add colchicine.  - Repeat CXR tomorrow morning after further diuresis to see if there is a pleural effusion that needs thoracentesis.  3. Hiccups: Intractable, this is his main complaint. GI following, currently on gabapentin.  To get upper GI series today.  4. PNA: Afebrile, WBCs 13.  - Continue cefdinir.  5. Atrial fibrillation: Paroxysmal.  - Continue po amiodarone.  - Continue Eliquis.   Loralie Champagne 05/16/2021 11:05 AM

## 2021-05-17 ENCOUNTER — Inpatient Hospital Stay: Payer: Self-pay

## 2021-05-17 ENCOUNTER — Inpatient Hospital Stay (HOSPITAL_COMMUNITY): Payer: PPO

## 2021-05-17 DIAGNOSIS — R066 Hiccough: Secondary | ICD-10-CM | POA: Diagnosis not present

## 2021-05-17 DIAGNOSIS — N179 Acute kidney failure, unspecified: Secondary | ICD-10-CM | POA: Diagnosis not present

## 2021-05-17 DIAGNOSIS — I249 Acute ischemic heart disease, unspecified: Secondary | ICD-10-CM | POA: Diagnosis not present

## 2021-05-17 LAB — BASIC METABOLIC PANEL
Anion gap: 15 (ref 5–15)
Anion gap: 10 (ref 5–15)
BUN: 26 mg/dL — ABNORMAL HIGH (ref 8–23)
BUN: 34 mg/dL — ABNORMAL HIGH (ref 8–23)
CO2: 31 mmol/L (ref 22–32)
CO2: 32 mmol/L (ref 22–32)
Calcium: 8.1 mg/dL — ABNORMAL LOW (ref 8.9–10.3)
Calcium: 7.6 mg/dL — ABNORMAL LOW (ref 8.9–10.3)
Chloride: 82 mmol/L — ABNORMAL LOW (ref 98–111)
Chloride: 85 mmol/L — ABNORMAL LOW (ref 98–111)
Creatinine, Ser: 1.79 mg/dL — ABNORMAL HIGH (ref 0.61–1.24)
Creatinine, Ser: 2.46 mg/dL — ABNORMAL HIGH (ref 0.61–1.24)
GFR, Estimated: 40 mL/min — ABNORMAL LOW (ref >60)
GFR, Estimated: 27 mL/min — ABNORMAL LOW (ref >60)
Glucose, Bld: 124 mg/dL — ABNORMAL HIGH (ref 70–99)
Glucose, Bld: 126 mg/dL — ABNORMAL HIGH (ref 70–99)
Potassium: 3.4 mmol/L — ABNORMAL LOW (ref 3.5–5.1)
Potassium: 3.5 mmol/L (ref 3.5–5.1)
Sodium: 128 mmol/L — ABNORMAL LOW (ref 135–145)
Sodium: 127 mmol/L — ABNORMAL LOW (ref 135–145)

## 2021-05-17 LAB — POCT I-STAT 7, (LYTES, BLD GAS, ICA,H+H)
Acid-Base Excess: 10 mmol/L — ABNORMAL HIGH (ref 0.0–2.0)
Acid-Base Excess: 10 mmol/L — ABNORMAL HIGH (ref 0.0–2.0)
Bicarbonate: 33.6 mmol/L — ABNORMAL HIGH (ref 20.0–28.0)
Bicarbonate: 34.1 mmol/L — ABNORMAL HIGH (ref 20.0–28.0)
Calcium, Ion: 1.06 mmol/L — ABNORMAL LOW (ref 1.15–1.40)
Calcium, Ion: 1.06 mmol/L — ABNORMAL LOW (ref 1.15–1.40)
HCT: 29 % — ABNORMAL LOW (ref 39.0–52.0)
HCT: 37 % — ABNORMAL LOW (ref 39.0–52.0)
Hemoglobin: 9.9 g/dL — ABNORMAL LOW (ref 13.0–17.0)
Hemoglobin: 12.6 g/dL — ABNORMAL LOW (ref 13.0–17.0)
O2 Saturation: 84 %
O2 Saturation: 93 %
Patient temperature: 98.6
Patient temperature: 98.6
Potassium: 3.4 mmol/L — ABNORMAL LOW (ref 3.5–5.1)
Potassium: 3.3 mmol/L — ABNORMAL LOW (ref 3.5–5.1)
Sodium: 127 mmol/L — ABNORMAL LOW (ref 135–145)
Sodium: 127 mmol/L — ABNORMAL LOW (ref 135–145)
TCO2: 35 mmol/L — ABNORMAL HIGH (ref 22–32)
TCO2: 35 mmol/L — ABNORMAL HIGH (ref 22–32)
pCO2 arterial: 40.7 mmHg (ref 32.0–48.0)
pCO2 arterial: 42.4 mmHg (ref 32.0–48.0)
pH, Arterial: 7.524 — ABNORMAL HIGH (ref 7.350–7.450)
pH, Arterial: 7.514 — ABNORMAL HIGH (ref 7.350–7.450)
pO2, Arterial: 44 mmHg — ABNORMAL LOW (ref 83.0–108.0)
pO2, Arterial: 59 mmHg — ABNORMAL LOW (ref 83.0–108.0)

## 2021-05-17 LAB — GLUCOSE, CAPILLARY: Glucose-Capillary: 128 mg/dL — ABNORMAL HIGH (ref 70–99)

## 2021-05-17 LAB — MAGNESIUM: Magnesium: 1.9 mg/dL (ref 1.7–2.4)

## 2021-05-17 MED ORDER — AMIODARONE HCL IN DEXTROSE 360-4.14 MG/200ML-% IV SOLN
30.0000 mg/h | INTRAVENOUS | Status: DC
  Administered 2021-05-18 – 2021-05-22 (×9): 30 mg/h via INTRAVENOUS
  Filled 2021-05-17 (×13): qty 200

## 2021-05-17 MED ORDER — SODIUM CHLORIDE 0.9% FLUSH
10.0000 mL | Freq: Two times a day (BID) | INTRAVENOUS | Status: DC
  Administered 2021-05-17 – 2021-05-19 (×5): 10 mL
  Administered 2021-05-20: 20 mL
  Administered 2021-05-20 – 2021-06-05 (×17): 10 mL
  Administered 2021-06-06: 20 mL
  Administered 2021-06-07 – 2021-06-11 (×5): 10 mL
  Administered 2021-06-12: 40 mL
  Administered 2021-06-12: 10 mL
  Administered 2021-06-13: 40 mL
  Administered 2021-06-13: 10 mL
  Administered 2021-06-14: 40 mL
  Administered 2021-06-14 – 2021-06-16 (×4): 10 mL

## 2021-05-17 MED ORDER — MAGNESIUM OXIDE -MG SUPPLEMENT 400 (240 MG) MG PO TABS
400.0000 mg | ORAL_TABLET | Freq: Two times a day (BID) | ORAL | Status: DC
  Administered 2021-05-17: 400 mg via ORAL
  Filled 2021-05-17: qty 1

## 2021-05-17 MED ORDER — SODIUM CHLORIDE 0.9 % IV SOLN
2.0000 g | INTRAVENOUS | Status: DC
  Filled 2021-05-17: qty 20

## 2021-05-17 MED ORDER — SODIUM CHLORIDE 0.9 % IV BOLUS
500.0000 mL | Freq: Once | INTRAVENOUS | Status: AC
  Administered 2021-05-17: 250 mL via INTRAVENOUS

## 2021-05-17 MED ORDER — CARVEDILOL 3.125 MG PO TABS: 3.1250 mg | ORAL_TABLET | Freq: Two times a day (BID) | ORAL | Status: DC

## 2021-05-17 MED ORDER — ENOXAPARIN SODIUM 40 MG/0.4ML IJ SOSY
40.0000 mg | PREFILLED_SYRINGE | INTRAMUSCULAR | Status: DC
  Administered 2021-05-18 – 2021-05-21 (×4): 40 mg via SUBCUTANEOUS
  Filled 2021-05-17 (×4): qty 0.4

## 2021-05-17 MED ORDER — ALBUMIN HUMAN 5 % IV SOLN
12.5000 g | Freq: Once | INTRAVENOUS | Status: AC
  Administered 2021-05-17: 12.5 g via INTRAVENOUS
  Filled 2021-05-17: qty 250

## 2021-05-17 MED ORDER — MAGNESIUM SULFATE 2 GM/50ML IV SOLN
2.0000 g | Freq: Once | INTRAVENOUS | Status: AC
Start: 2021-05-17 — End: 2021-05-17
  Administered 2021-05-17: 2 g via INTRAVENOUS
  Filled 2021-05-17: qty 50

## 2021-05-17 MED ORDER — DIAZEPAM 5 MG/ML IJ SOLN
1.2500 mg | Freq: Every day | INTRAMUSCULAR | Status: DC
  Administered 2021-05-17 – 2021-05-19 (×3): 1.25 mg via INTRAVENOUS
  Filled 2021-05-17 (×2): qty 2

## 2021-05-17 MED ORDER — ALBUMIN HUMAN 5 % IV SOLN
INTRAVENOUS | Status: AC
  Administered 2021-05-17: 12.5 g
  Filled 2021-05-17: qty 250

## 2021-05-17 MED ORDER — AMIODARONE HCL IN DEXTROSE 360-4.14 MG/200ML-% IV SOLN: 60.0000 mg/h | INTRAVENOUS | Status: DC

## 2021-05-17 MED ORDER — CARVEDILOL 6.25 MG PO TABS: 6.2500 mg | ORAL_TABLET | Freq: Two times a day (BID) | ORAL | Status: DC

## 2021-05-17 MED ORDER — SODIUM CHLORIDE 0.9% FLUSH
10.0000 mL | INTRAVENOUS | Status: DC | PRN
  Administered 2021-06-13: 10 mL

## 2021-05-17 MED ORDER — POTASSIUM CHLORIDE 10 MEQ/50ML IV SOLN
10.0000 meq | INTRAVENOUS | Status: AC
  Administered 2021-05-17 (×2): 10 meq via INTRAVENOUS
  Filled 2021-05-17 (×2): qty 50

## 2021-05-17 MED ORDER — AMIODARONE HCL IN DEXTROSE 360-4.14 MG/200ML-% IV SOLN: 30.0000 mg/h | INTRAVENOUS | Status: DC

## 2021-05-17 MED ORDER — FUROSEMIDE 10 MG/ML IJ SOLN: 40.0000 mg | Freq: Two times a day (BID) | INTRAMUSCULAR | Status: DC

## 2021-05-17 MED ORDER — SODIUM CHLORIDE 0.9 % IV SOLN
1.0000 g | Freq: Two times a day (BID) | INTRAVENOUS | Status: DC
  Administered 2021-05-17 – 2021-05-18 (×2): 1 g via INTRAVENOUS
  Filled 2021-05-17 (×4): qty 1

## 2021-05-17 MED ORDER — GABAPENTIN 100 MG PO CAPS
200.0000 mg | ORAL_CAPSULE | Freq: Three times a day (TID) | ORAL | Status: DC
  Administered 2021-05-17: 200 mg via ORAL
  Filled 2021-05-17: qty 2

## 2021-05-17 MED ORDER — SODIUM CHLORIDE 0.9 % IV BOLUS
500.0000 mL | Freq: Once | INTRAVENOUS | Status: AC
  Administered 2021-05-17: 500 mL via INTRAVENOUS

## 2021-05-17 MED ORDER — CHLORHEXIDINE GLUCONATE CLOTH 2 % EX PADS
6.0000 | MEDICATED_PAD | Freq: Every day | CUTANEOUS | Status: DC
  Administered 2021-05-19 – 2021-05-21 (×3): 6 via TOPICAL

## 2021-05-17 NOTE — Progress Notes (Signed)
0300: Pt went into afib for approximately 15 mins and converted back to SR. Pt denied chest pain, SHOB.   Patient intermittently flipping into Afib and converting back to SR.

## 2021-05-17 NOTE — Progress Notes (Signed)
[address]       [place] [address]             [phone]      11 Days Post-Op Procedure(s) (LRB): CORONARY ARTERY BYPASS GRAFTING (CABG) x3 on pump using left internal mammary artery and right endoscopic greater saphenous vein conduit. (N/A) TRANSESOPHAGEAL ECHOCARDIOGRAM (TEE) (N/A) APPLICATION OF CELL SAVER ENDOVEIN HARVEST OF GREATER SAPHENOUS VEIN (Right)  Subjective:  Patient sleepy, somnolent... difficult to get him to participate in evaluation. States hiccoughs are much better.  Objective: Vital signs in last 24 hours: Temp:  [97.9 F (36.6 C)-98.9 F (37.2 C)] 98.3 F (36.8 C) (01/29 0321) Pulse Rate:  [81-97] 90 (01/29 0015) Cardiac Rhythm: Normal sinus rhythm;Other (Comment) (01/29 0315) Resp:  [19-22] 19 (01/29 0015) BP: (78-130)/(46-75) 88/75 (01/29 0653) SpO2:  [90 %-97 %] 95 % (01/29 0015) Weight:  [81.1 kg] 81.1 kg (01/29 0321)  Intake/Output from previous day: 01/28 0701 - 01/29 0700 In: 240 [P.O.:240] Out: 3095 [Urine:3095]  General appearance: alert, cooperative, and no distress Heart: regular rate and rhythm Lungs: clear to auscultation bilaterally Abdomen: soft, non-tender; bowel sounds normal; no masses,  no organomegaly Extremities: edema 1+ pitting Wound: clean and dry  Lab Results: Recent Labs    05/15/21 0117  WBC 13.0*  HGB 9.6*  HCT 27.9*  PLT 218   BMET:  Recent Labs    05/15/21 0117 05/17/21 0413  NA 128* 128*  K 3.8 3.4*  CL 86* 82*  CO2 33* 31  GLUCOSE 135* 124*  BUN 18 26*  CREATININE 1.16 1.79*  CALCIUM 8.2* 8.1*    PT/INR: No results for input(s): LABPROT, INR in the last 72 hours. ABG    Component Value Date/Time   PHART 7.347 (L) 05/08/2021 0323   HCO3 22.7 05/08/2021 0323   TCO2 22 05/08/2021 1407   ACIDBASEDEF 3.0 (H) 05/08/2021 0323   O2SAT 46.7 05/11/2021 0706   CBG (last 3)  No results for input(s): GLUCAP in the last 72 hours.  Assessment/Plan: S/P Procedure(s)  (LRB): CORONARY ARTERY BYPASS GRAFTING (CABG) x3 on pump using left internal mammary artery and right endoscopic greater saphenous vein conduit. (N/A) TRANSESOPHAGEAL ECHOCARDIOGRAM (TEE) (N/A) APPLICATION OF CELL SAVER ENDOVEIN HARVEST OF GREATER SAPHENOUS VEIN (Right)  CV- I/O of A.Fib overnight, converted to NSR with PVCs while I was in room with patient, BP was low but should improve as he maintains NSR... on Coreg, Amiodarone, Entresto, Eliquis Pulm- weaning oxygen as tolerated.. CXR ordered however radiology unwilling to perform until patient has UGI study at same time.. this is needed to direct diuretic regimen for his pleural effusions Renal- creatinine up to 1.79, this is due to aggressive diuretics, regimen is completed... he has some mild pitting edema on exam, K is borderline low at 3.4, will supplement today.. Mg is also low normal will supplement GI- Hiccoughs much improved with addition of Valium... patient is very sedated this morning, not really participatory in evaluation.. nursing states it takes a lot of effort to get him to comply with task/request but it does improve later in the day..... he is scheduled for UGI today ID- afebrile, leukocytosis stable, suspected pneumonia for bilateral upper lung field opacities.. he is currently on Cefdinir  Dispo- patient I/O A. Fib overnight, adjust BB as BP allows, diuretics completed with elevated creatinine level, awaiting CXR to be completed to assess pleural effusions, GI is following hiccoughs which have improved per patient with addition of Valium.Marland Kitchen  UGI study today   LOS: 12 days   Ellwood Handler, PA-C 05/17/2021

## 2021-05-17 NOTE — Progress Notes (Signed)
° °   °  [address]       [place] [address]             [phone]        Called by nurse for hypotension with SBP in the 60s.  He was in NSR at that times.  Manual repeat BP confirmed SBP in the 60s.  Patient converted to A. FIb while in the room..    Plan:  Dehydration due to aggressive IV diuretics--- will give 500 cc NS bolus, stop entresto for now A. FIb, will restart Amiodarone drip per Dr. Aundra Dubin request Will delay UGI study tomorrow until BP improves and he is more stabilized CXR port requested.   Ellwood Handler, PA-C

## 2021-05-17 NOTE — Progress Notes (Signed)
Peripherally Inserted Central Catheter Placement  The IV Nurse has discussed with the patient and/or persons authorized to consent for the patient, the purpose of this procedure and the potential benefits and risks involved with this procedure.  The benefits include less needle sticks, lab draws from the catheter, and the patient may be discharged home with the catheter. Risks include, but not limited to, infection, bleeding, blood clot (thrombus formation), and puncture of an artery; nerve damage and irregular heartbeat and possibility to perform a PICC exchange if needed/ordered by physician.  Alternatives to this procedure were also discussed.  Bard Power PICC patient education guide, fact sheet on infection prevention and patient information card has been provided to patient /or left at bedside.    PICC Placement Documentation  PICC Double Lumen 40/97/35 PICC Right Basilic 37 cm 0 cm (Active)  Indication for Insertion or Continuance of Line Vasoactive infusions 05/17/21 1707  Exposed Catheter (cm) 0 cm 05/17/21 1707  Site Assessment Clean;Dry;Intact 05/17/21 1707  Lumen #1 Status Flushed;Saline locked;Blood return noted 05/17/21 1707  Lumen #2 Status Flushed;Saline locked;Blood return noted 05/17/21 1707  Dressing Type Transparent;Securing device 05/17/21 1707  Dressing Status Clean;Dry;Intact 05/17/21 1707  Antimicrobial disc in place? Yes 05/17/21 1707  Safety Lock Not Applicable 32/99/24 2683  Line Adjustment (NICU/IV Team Only) No 05/17/21 1707  Dressing Intervention New dressing;Other (Comment) 05/17/21 1707  Dressing Change Due 05/24/21 05/17/21 1707    Consent signed by patient's spouse, Carlisle Beers.    Enos Fling 05/17/2021, 5:08 PM

## 2021-05-17 NOTE — Progress Notes (Signed)
Patient ID: ALICK LECOMTE, male   DOB: 1950/11/28, 71 y.o.   MRN: [id number]   Progress Note  Patient Name: Randall Hodges Date of Encounter: 05/17/2021  [facility] Cardiologist: [name], MD   Subjective   Hiccupping improved on valium.   He diuresed well yesterday, but creatinine now up to 1.79 and SBP around 70 this morning.  He is oriented but mildly drowsy. He is back in atrial fibrillation with RVR in 120s this morning as well.   Inpatient Medications    Scheduled Meds:  apixaban  5 mg Oral BID   aspirin EC  81 mg Oral Daily   cefdinir  300 mg Oral Q12H   chlorhexidine  15 mL Mouth Rinse BID   colchicine  0.6 mg Oral Daily   diazepam  2 mg Oral BID   feeding supplement  237 mL Oral TID WC   gabapentin  200 mg Oral TID   guaiFENesin  1,200 mg Oral BID   magnesium oxide  400 mg Oral BID   mouth rinse  15 mL Mouth Rinse q12n4p   pantoprazole  40 mg Oral BID   potassium chloride  40 mEq Oral Daily   rosuvastatin  10 mg Oral Daily   sodium chloride flush  10-40 mL Intracatheter Q12H   sodium chloride flush  3 mL Intravenous Q12H   Continuous Infusions:  amiodarone     sodium chloride     PRN Meds: metoprolol tartrate, ondansetron (ZOFRAN) IV, sodium chloride flush, sodium chloride flush, traMADol   Vital Signs    Vitals:   05/17/21 0321 05/17/21 0616 05/17/21 0653 05/17/21 0830  BP:  (!) 84/64 (!) 88/75 (!) 87/53  Pulse:    97  Resp:    20  Temp: 98.3 F (36.8 C)   (!) 97.5 F (36.4 C)  TempSrc: Oral   Oral  SpO2:    93%  Weight: 81.1 kg     Height:        Intake/Output Summary (Last 24 hours) at 05/17/2021 1007 Last data filed at 05/16/2021 2322 Gross per 24 hour  Intake 240 ml  Output 3095 ml  Net -2855 ml   Last 3 Weights 05/17/2021 05/16/2021 05/13/2021  Weight (lbs) 178 lb 12.7 oz 181 lb 8 oz 191 lb 5.8 oz  Weight (kg) 81.1 kg 82.328 kg 86.8 kg      Telemetry    Atrial fibrillation rate 120s - Personally Reviewed  Physical Exam    General: NAD Neck: No JVD, no thyromegaly or thyroid nodule.  Lungs: Clear to auscultation bilaterally with normal respiratory effort. CV: Nondisplaced PMI.  Heart regular S1/S2, no S3/S4, no murmur.  No peripheral edema.   Abdomen: Soft, nontender, no hepatosplenomegaly, no distention.  Skin: Intact without lesions or rashes.  Neurologic: Alert and oriented x 3.  Psych: Normal affect. Extremities: No clubbing or cyanosis.  HEENT: Normal.   Labs    High Sensitivity Troponin:   Recent Labs  Lab 05/02/2021 1321 05/17/2021 1518  TROPONINIHS 3 4     Chemistry Recent Labs  Lab 05/14/21 0148 05/15/21 0117 05/17/21 0413  NA 129* 128* 128*  K 4.8 3.8 3.4*  CL 92* 86* 82*  CO2 25 33* 31  GLUCOSE 92 135* 124*  BUN 20 18 26*  CREATININE 1.04 1.16 1.79*  CALCIUM 8.4* 8.2* 8.1*  MG  --  1.8  --   GFRNONAA >60 >60 40*  ANIONGAP 12 9 15     Lipids  Recent Labs  Lab 05/13/21 0056  CHOL 71  TRIG 56  HDL 25*  LDLCALC 35  CHOLHDL 2.8    Hematology Recent Labs  Lab 05/13/21 0056 05/14/21 0148 05/15/21 0117  WBC 14.5* 12.4* 13.0*  RBC 3.12* 3.16* 3.17*  HGB 9.6* 9.6* 9.6*  HCT 27.8* 28.7* 27.9*  MCV 89.1 90.8 88.0  MCH 30.8 30.4 30.3  MCHC 34.5 33.4 34.4  RDW 13.8 13.7 13.5  PLT 153 174 218   Thyroid No results for input(s): TSH, FREET4 in the last 168 hours.  BNPNo results for input(s): BNP, PROBNP in the last 168 hours.  DDimer No results for input(s): DDIMER in the last 168 hours.   Radiology    No results found.  Cardiac Studies   Cath 05/19/2021 Distal left main [place] 111 bifurcation stenosis with 75% left main, 90% ostial to proximal LAD, and 80-90% ostial circumflex (difficult to assess due to heavy calcification). Severe mid circumflex disease with 70% eccentric mid stenosis and second obtuse marginal containing ostial to proximal greater than 80% stenosis.  (Bifurcation [place] 111 Severe calcification in left main and LAD in particular with diffuse 50%  narrowing from proximal to mid vessel and tandem 70% stenoses in the mid LAD. Nondominant right coronary Normal LV function.  EF 55%.  LVEDP normal.    Patient Profile     71 y.o. male with PMH of PVCs presented with chest pain. Cardiac cath by Dr. Tamala Julian on 04/23/2021 showed 75% left main, 90% ost to prox LAD, 80-90% ost LCx, 70% mid LCx, 80% OM2, 50% prox to mid LAD, 70% mid LAD, EF 55%. Patient underwent CABG x 3 on 05/04/2021. Post op course complicated afib, treated with amio. CXR showed opacity in bilateral lung, started abx on 1/25 and diuretic. Started on Eliquis due to recurrence of afib.    Assessment & Plan    1. CAD: Admitted with unstable angina, cath with severe left main and proximal LAD/LCx disease (nondominant RCA).  CABG x 3 on 1/18 with LIMA-LAD, SVG-OM, SVG-left PDA.   - Continue ASA 81, statin.  2. 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  He has mild pleuritic chest pain, denies dyspnea.  BP low today after aggressive diuresis over the prior 2-3 days. On exam, he is not volume overloaded.  AKI with creatinine up to 1.79. - No further diuresis.  Will give 500 cc NS bolus.  - Stop Coreg and Entresto for now.  - With pericardial abnormality, he is on colchicine => ?component of post-pericardiotomy syndrome based on echo.  - Will need eventual repeat CXR to see if there is a pleural effusion that needs thoracentesis.  3. Hiccups: Intractable, this has been his main complaint. GI following, currently on gabapentin and valium with some improvement.  Will need upper GI series.  4. PNA: Afebrile.  - Continue cefdinir.  5. Atrial fibrillation: Paroxysmal. Back in AF with RVR this morning.  - Transition back to amiodarone 30 mg/hr gtt.  - Continue Eliquis.  6.  AKI: Creatinine up to 1.79 after aggressive diuresis and drop in BP.  - Hold BP-active meds.  - No further diuresis.  - 500 cc NS bolus and reassess.   Loralie Champagne 05/17/2021 10:07 AM

## 2021-05-17 NOTE — Progress Notes (Addendum)
° ° ° °  [facility] Progress Note  CC:  Hiccups  Assessment / Plan: Post-operative hiccups after CABG. There may be some early response to addition of low dose Valium yesterday to PPI and gabapentin (day 3) as he did not hiccup at all during my evaluation today which is in stark contrast to his hiccupping yesterday. However, wife reports ongoing hiccupping this morning.    Gabapentin dose reduced to 200 mg TID this morning. Given his increased somnolence we may need to reduce his Valium dose. He had hallucinations after a trial of baclofen.  A couple of doses of chlorpromazine did not seem to provide any relief.    Recommend: - Continue pantoprazole and gabapentin - Low dose valium started at 2 mg BID, can titrate up as needed if it is well tolerated as we are starting at a very low dose, but we may actually need to try a lower dose if he remains as sedated as he is this morning - May benefit from a more extended trial of chlorpromazine - Awaiting UGI - they are not routinely performed on the weekend but the radiology staff told me they would try to accommodate the request today - Testing for H pylori to be obtained with the next stool  Subjective: Mr Broady is sleepy but arousable. Frustrated by ongoing hiccups. Notes from surgery show that he reported improvement in hiccups earlier today. Wife is present at the bedside and notes that he was hiccupping when she arrived despite addition of Valium. She reports a rough night from the hiccups and atrial fibrillation.    Objective:  Vital signs in last 24 hours: Temp:  [97.5 F (36.4 C)-98.9 F (37.2 C)] 97.5 F (36.4 C) (01/29 0830) Pulse Rate:  [81-97] 97 (01/29 0830) Resp:  [19-22] 20 (01/29 0830) BP: (78-130)/(46-75) 87/53 (01/29 0830) SpO2:  [90 %-97 %] 93 % (01/29 0830) Weight:  [81.1 kg] 81.1 kg (01/29 0321) Last BM Date: 05/16/21 General:   Sleepy, in NAD, resting in bed, no hiccups occurred during my visit Abdomen:   Soft. Nontender. Nondistended. Normal bowel sounds. No rebound or guarding. Neurologic:  Alert and  oriented x4;  grossly normal neurologically. Psych:  Alert and cooperative. Normal mood and affect.  Lab Results: Recent Labs    05/15/21 0117  WBC 13.0*  HGB 9.6*  HCT 27.9*  PLT 218   BMET Recent Labs    05/15/21 0117 05/17/21 0413  NA 128* 128*  K 3.8 3.4*  CL 86* 82*  CO2 33* 31  GLUCOSE 135* 124*  BUN 18 26*  CREATININE 1.16 1.79*  CALCIUM 8.2* 8.1*     LOS: 12 days   Thornton [place]  05/17/2021, 9:29 AM

## 2021-05-17 NOTE — Progress Notes (Signed)
Mobility Specialist Progress Note:   05/17/21 1040  Mobility  Activity Contraindicated/medical hold   Pt with low BP. Will follow-up as time allows.   [facility] Public librarian Phone [phone] Secondary Phone [phone]

## 2021-05-17 NOTE — Progress Notes (Signed)
CT Surgery  Stable in ICU after PICC placed and albumin administered NSR on iv amio PO2 59 on 6 L from aspiration pneumoia- now npo on ia Maxipime Will monitor and proceed with swalow eval  and UGI assessment of stomach per GI IV valium HS for hiccups  P Prescott Gum

## 2021-05-17 NOTE — Progress Notes (Addendum)
CT Surgery Transfer note   Patient is being transferred back to ICU due to persistent hypotension and inability to provide adequate IV rehydration with poor venous access.  Blood pressure on arrival is 68/45.  O2 saturation is 94% on 4 L.  Chest x-ray today shows increased infiltrate in the right upper lung field.  Patient has demonstrated a clinical decline over the past 48 hours with probable aspiration from constant hiccups and swallow dysfunction, intermittent rapid atrial fibrillation, and volume depletion from aggressive diuresis to clear his lungs. He will need central venous access, n.p.o. until formal swallow study is performed and the barium swallow study as recommended by GI is performed.  We will continue IV amiodarone for his intermittent PAF and PVCs.  We will need to discontinue the anxiolytic /sedation meds for his hiccups including Valium and Neurontin and Thorazine.  He will need an ABG and some albumin for volume expansion and he will be placed on IV Maxipime for the aspiration pneumonia on x-ray.  Continue IV amiodarone and maintenance IV fluid while he is n.p.o. for swallow studies and assessment of aspiration.  Patient will need a follow-up limited echocardiogram to assess the postoperative small pericardial effusion that was noted several days ago now that Eliquis has been started for PAF  Discussed the transfer back to ICU with the patient as well as his wife and they understand the plan of care.  P Prescott Gum MD

## 2021-05-17 NOTE — Progress Notes (Signed)
500cc NS bolus given at 1045, however apparent that most of it leaked into bed.  Pt received new PIV and bolus run again at 1145.  Will cont plan of care.

## 2021-05-18 ENCOUNTER — Inpatient Hospital Stay (HOSPITAL_COMMUNITY): Payer: PPO

## 2021-05-18 ENCOUNTER — Inpatient Hospital Stay (HOSPITAL_COMMUNITY): Admission: EM | Disposition: E | Payer: Self-pay | Source: Home / Self Care | Attending: Cardiothoracic Surgery

## 2021-05-18 ENCOUNTER — Other Ambulatory Visit (HOSPITAL_COMMUNITY): Payer: Self-pay

## 2021-05-18 DIAGNOSIS — I3139 Other pericardial effusion (noninflammatory): Secondary | ICD-10-CM | POA: Diagnosis not present

## 2021-05-18 DIAGNOSIS — R1310 Dysphagia, unspecified: Secondary | ICD-10-CM

## 2021-05-18 DIAGNOSIS — R066 Hiccough: Secondary | ICD-10-CM

## 2021-05-18 DIAGNOSIS — Z951 Presence of aortocoronary bypass graft: Secondary | ICD-10-CM | POA: Diagnosis not present

## 2021-05-18 DIAGNOSIS — N179 Acute kidney failure, unspecified: Secondary | ICD-10-CM

## 2021-05-18 DIAGNOSIS — I249 Acute ischemic heart disease, unspecified: Secondary | ICD-10-CM | POA: Diagnosis not present

## 2021-05-18 DIAGNOSIS — J9601 Acute respiratory failure with hypoxia: Secondary | ICD-10-CM

## 2021-05-18 DIAGNOSIS — I318 Other specified diseases of pericardium: Secondary | ICD-10-CM | POA: Diagnosis not present

## 2021-05-18 HISTORY — PX: RIGHT/LEFT HEART CATH AND CORONARY ANGIOGRAPHY: CATH118266

## 2021-05-18 LAB — BASIC METABOLIC PANEL
Anion gap: 13 (ref 5–15)
BUN: 36 mg/dL — ABNORMAL HIGH (ref 8–23)
CO2: 30 mmol/L (ref 22–32)
Calcium: 7.9 mg/dL — ABNORMAL LOW (ref 8.9–10.3)
Chloride: 84 mmol/L — ABNORMAL LOW (ref 98–111)
Creatinine, Ser: 2.11 mg/dL — ABNORMAL HIGH (ref 0.61–1.24)
GFR, Estimated: 33 mL/min — ABNORMAL LOW (ref >60)
Glucose, Bld: 206 mg/dL — ABNORMAL HIGH (ref 70–99)
Potassium: 3.6 mmol/L (ref 3.5–5.1)
Sodium: 127 mmol/L — ABNORMAL LOW (ref 135–145)

## 2021-05-18 LAB — CBC
HCT: 26.1 % — ABNORMAL LOW (ref 39.0–52.0)
Hemoglobin: 8.7 g/dL — ABNORMAL LOW (ref 13.0–17.0)
MCH: 29.8 pg (ref 26.0–34.0)
MCHC: 33.3 g/dL (ref 30.0–36.0)
MCV: 89.4 fL (ref 80.0–100.0)
Platelets: 301 10*3/uL (ref 150–400)
RBC: 2.92 MIL/uL — ABNORMAL LOW (ref 4.22–5.81)
RDW: 13.9 % (ref 11.5–15.5)
WBC: 19.4 10*3/uL — ABNORMAL HIGH (ref 4.0–10.5)
nRBC: 0 % (ref 0.0–0.2)

## 2021-05-18 LAB — POCT I-STAT EG7
Acid-Base Excess: 6 mmol/L — ABNORMAL HIGH (ref 0.0–2.0)
Acid-Base Excess: 7 mmol/L — ABNORMAL HIGH (ref 0.0–2.0)
Bicarbonate: 31 mmol/L — ABNORMAL HIGH (ref 20.0–28.0)
Bicarbonate: 32 mmol/L — ABNORMAL HIGH (ref 20.0–28.0)
Calcium, Ion: 1.11 mmol/L — ABNORMAL LOW (ref 1.15–1.40)
Calcium, Ion: 1.11 mmol/L — ABNORMAL LOW (ref 1.15–1.40)
HCT: 28 % — ABNORMAL LOW (ref 39.0–52.0)
HCT: 29 % — ABNORMAL LOW (ref 39.0–52.0)
Hemoglobin: 9.5 g/dL — ABNORMAL LOW (ref 13.0–17.0)
Hemoglobin: 9.9 g/dL — ABNORMAL LOW (ref 13.0–17.0)
O2 Saturation: 51 %
O2 Saturation: 54 %
Potassium: 3.6 mmol/L (ref 3.5–5.1)
Potassium: 3.7 mmol/L (ref 3.5–5.1)
Sodium: 130 mmol/L — ABNORMAL LOW (ref 135–145)
Sodium: 131 mmol/L — ABNORMAL LOW (ref 135–145)
TCO2: 32 mmol/L (ref 22–32)
TCO2: 33 mmol/L — ABNORMAL HIGH (ref 22–32)
pCO2, Ven: 46.6 mmHg (ref 44.0–60.0)
pCO2, Ven: 47.5 mmHg (ref 44.0–60.0)
pH, Ven: 7.422 (ref 7.250–7.430)
pH, Ven: 7.445 — ABNORMAL HIGH (ref 7.250–7.430)
pO2, Ven: 27 mmHg — CL (ref 32.0–45.0)
pO2, Ven: 28 mmHg — CL (ref 32.0–45.0)

## 2021-05-18 LAB — GLUCOSE, CAPILLARY
Glucose-Capillary: 117 mg/dL — ABNORMAL HIGH (ref 70–99)
Glucose-Capillary: 127 mg/dL — ABNORMAL HIGH (ref 70–99)
Glucose-Capillary: 141 mg/dL — ABNORMAL HIGH (ref 70–99)
Glucose-Capillary: 114 mg/dL — ABNORMAL HIGH (ref 70–99)

## 2021-05-18 LAB — ECHOCARDIOGRAM LIMITED
Area-P 1/2: 5.27 cm2
Height: 70 in
S' Lateral: 2.9 cm
Weight: 2860.69 oz

## 2021-05-18 LAB — COOXEMETRY PANEL
Carboxyhemoglobin: 1.8 % — ABNORMAL HIGH (ref 0.5–1.5)
Carboxyhemoglobin: 1.4 % (ref 0.5–1.5)
Methemoglobin: 1.1 % (ref 0.0–1.5)
Methemoglobin: 0.9 % (ref 0.0–1.5)
O2 Saturation: 58.4 %
O2 Saturation: 75.5 %
Total hemoglobin: 9 g/dL — ABNORMAL LOW (ref 12.0–16.0)
Total hemoglobin: 8.8 g/dL — ABNORMAL LOW (ref 12.0–16.0)

## 2021-05-18 LAB — PROCALCITONIN: Procalcitonin: 0.33 ng/mL

## 2021-05-18 SURGERY — RIGHT/LEFT HEART CATH AND CORONARY ANGIOGRAPHY
Anesthesia: LOCAL

## 2021-05-18 MED ORDER — ONDANSETRON HCL 4 MG/2ML IJ SOLN: 4.0000 mg | Freq: Four times a day (QID) | INTRAMUSCULAR | Status: DC | PRN

## 2021-05-18 MED ORDER — ALBUMIN HUMAN 5 % IV SOLN
INTRAVENOUS | Status: AC
  Filled 2021-05-18: qty 250

## 2021-05-18 MED ORDER — VERAPAMIL HCL 2.5 MG/ML IV SOLN
INTRAVENOUS | Status: AC
  Filled 2021-05-18: qty 2

## 2021-05-18 MED ORDER — SODIUM CHLORIDE 0.9 % IV BOLUS
250.0000 mL | Freq: Once | INTRAVENOUS | Status: AC
  Administered 2021-05-18: 250 mL via INTRAVENOUS

## 2021-05-18 MED ORDER — AMIODARONE LOAD VIA INFUSION
150.0000 mg | Freq: Once | INTRAVENOUS | Status: AC
  Administered 2021-05-18: 150 mg via INTRAVENOUS
  Filled 2021-05-18: qty 83.34

## 2021-05-18 MED ORDER — HEPARIN SODIUM (PORCINE) 1000 UNIT/ML IJ SOLN
INTRAMUSCULAR | Status: DC | PRN
  Administered 2021-05-18: 4000 [IU] via INTRAVENOUS

## 2021-05-18 MED ORDER — POTASSIUM CHLORIDE 10 MEQ/50ML IV SOLN
10.0000 meq | INTRAVENOUS | Status: AC
  Administered 2021-05-18 (×2): 10 meq via INTRAVENOUS
  Filled 2021-05-18 (×2): qty 50

## 2021-05-18 MED ORDER — AMIODARONE IV BOLUS ONLY 150 MG/100ML: 150.0000 mg | Freq: Once | INTRAVENOUS | Status: DC

## 2021-05-18 MED ORDER — FENTANYL CITRATE (PF) 100 MCG/2ML IJ SOLN
INTRAMUSCULAR | Status: DC | PRN
  Administered 2021-05-18: 12.5 ug via INTRAVENOUS

## 2021-05-18 MED ORDER — SODIUM CHLORIDE 0.9 % IV SOLN: 250.0000 mL | INTRAVENOUS | Status: DC | PRN

## 2021-05-18 MED ORDER — ALBUMIN HUMAN 5 % IV SOLN
INTRAVENOUS | Status: AC
  Administered 2021-05-18: 12.5 g via INTRAVENOUS
  Filled 2021-05-18: qty 250

## 2021-05-18 MED ORDER — SODIUM CHLORIDE 0.9% FLUSH
3.0000 mL | INTRAVENOUS | Status: DC | PRN
  Administered 2021-05-19: 3 mL via INTRAVENOUS

## 2021-05-18 MED ORDER — KCL IN DEXTROSE-NACL 20-5-0.9 MEQ/L-%-% IV SOLN
INTRAVENOUS | Status: DC
  Filled 2021-05-18: qty 1000

## 2021-05-18 MED ORDER — SODIUM CHLORIDE 0.9% FLUSH
3.0000 mL | Freq: Two times a day (BID) | INTRAVENOUS | Status: DC
  Administered 2021-05-19 – 2021-05-21 (×4): 3 mL via INTRAVENOUS

## 2021-05-18 MED ORDER — MIDAZOLAM HCL 2 MG/2ML IJ SOLN
INTRAMUSCULAR | Status: AC
  Filled 2021-05-18: qty 2

## 2021-05-18 MED ORDER — ALBUMIN HUMAN 5 % IV SOLN: 12.5000 g | Freq: Once | INTRAVENOUS | Status: AC

## 2021-05-18 MED ORDER — LIDOCAINE HCL (PF) 1 % IJ SOLN
INTRAMUSCULAR | Status: DC | PRN
  Administered 2021-05-18: 2 mL
  Administered 2021-05-18: 5 mL

## 2021-05-18 MED ORDER — VASOPRESSIN 20 UNITS/100 ML INFUSION FOR SHOCK
0.0000 [IU]/min | INTRAVENOUS | Status: DC
  Administered 2021-05-18 – 2021-05-21 (×6): 0.03 [IU]/min via INTRAVENOUS
  Administered 2021-05-21 – 2021-05-22 (×2): 0.04 [IU]/min via INTRAVENOUS
  Administered 2021-05-22 – 2021-05-23 (×3): 0.03 [IU]/min via INTRAVENOUS
  Administered 2021-05-24: 0.04 [IU]/min via INTRAVENOUS
  Filled 2021-05-18 (×10): qty 100
  Filled 2021-05-18: qty 200
  Filled 2021-05-18 (×2): qty 100

## 2021-05-18 MED ORDER — ACETAMINOPHEN 325 MG PO TABS: 650.0000 mg | ORAL_TABLET | ORAL | Status: DC | PRN

## 2021-05-18 MED ORDER — VERAPAMIL HCL 2.5 MG/ML IV SOLN
INTRAVENOUS | Status: DC | PRN
  Administered 2021-05-18: 10 mL via INTRA_ARTERIAL

## 2021-05-18 MED ORDER — FENTANYL CITRATE (PF) 100 MCG/2ML IJ SOLN
INTRAMUSCULAR | Status: AC
  Filled 2021-05-18: qty 2

## 2021-05-18 MED ORDER — PANTOPRAZOLE SODIUM 40 MG IV SOLR
40.0000 mg | Freq: Every day | INTRAVENOUS | Status: DC
  Administered 2021-05-18 – 2021-05-19 (×2): 40 mg via INTRAVENOUS
  Filled 2021-05-18 (×2): qty 40

## 2021-05-18 MED ORDER — SODIUM CHLORIDE 0.9% FLUSH: 3.0000 mL | INTRAVENOUS | Status: DC | PRN

## 2021-05-18 MED ORDER — ACETAMINOPHEN 10 MG/ML IV SOLN
1000.0000 mg | Freq: Four times a day (QID) | INTRAVENOUS | Status: AC
  Administered 2021-05-18 – 2021-05-19 (×4): 1000 mg via INTRAVENOUS
  Filled 2021-05-18 (×4): qty 100

## 2021-05-18 MED ORDER — NOREPINEPHRINE 4 MG/250ML-% IV SOLN
INTRAVENOUS | Status: AC
  Administered 2021-05-18: 4 ug/min via INTRAVENOUS
  Filled 2021-05-18: qty 250

## 2021-05-18 MED ORDER — LIDOCAINE HCL (PF) 1 % IJ SOLN
INTRAMUSCULAR | Status: AC
  Filled 2021-05-18: qty 30

## 2021-05-18 MED ORDER — DIAZEPAM 5 MG/ML IJ SOLN
1.2500 mg | Freq: Three times a day (TID) | INTRAMUSCULAR | Status: DC | PRN
  Administered 2021-05-18: 1.25 mg via INTRAVENOUS
  Filled 2021-05-18: qty 2

## 2021-05-18 MED ORDER — HEPARIN SODIUM (PORCINE) 1000 UNIT/ML IJ SOLN
INTRAMUSCULAR | Status: AC
  Filled 2021-05-18: qty 10

## 2021-05-18 MED ORDER — DIAZEPAM 5 MG/ML IJ SOLN
2.0000 mg | Freq: Four times a day (QID) | INTRAMUSCULAR | Status: DC | PRN
  Administered 2021-05-18 – 2021-05-20 (×5): 2 mg via INTRAVENOUS
  Filled 2021-05-18 (×5): qty 2

## 2021-05-18 MED ORDER — PIPERACILLIN-TAZOBACTAM 3.375 G IVPB
3.3750 g | Freq: Three times a day (TID) | INTRAVENOUS | Status: DC
  Administered 2021-05-18 – 2021-05-21 (×8): 3.375 g via INTRAVENOUS
  Filled 2021-05-18 (×8): qty 50

## 2021-05-18 MED ORDER — MORPHINE SULFATE (PF) 2 MG/ML IV SOLN
2.0000 mg | INTRAVENOUS | Status: DC | PRN
  Administered 2021-05-19 – 2021-05-21 (×10): 2 mg via INTRAVENOUS
  Filled 2021-05-18 (×11): qty 1

## 2021-05-18 MED ORDER — ALBUMIN HUMAN 5 % IV SOLN
12.5000 g | Freq: Once | INTRAVENOUS | Status: AC
  Administered 2021-05-18: 12.5 g via INTRAVENOUS

## 2021-05-18 MED ORDER — METOCLOPRAMIDE HCL 5 MG/ML IJ SOLN
10.0000 mg | Freq: Four times a day (QID) | INTRAMUSCULAR | Status: DC
  Administered 2021-05-18 – 2021-05-26 (×32): 10 mg via INTRAVENOUS
  Filled 2021-05-18 (×31): qty 2

## 2021-05-18 MED ORDER — HYDRALAZINE HCL 20 MG/ML IJ SOLN: 10.0000 mg | INTRAMUSCULAR | Status: AC | PRN

## 2021-05-18 MED ORDER — VITAL 1.5 CAL PO LIQD
1000.0000 mL | ORAL | Status: DC
  Administered 2021-05-18: 1000 mL

## 2021-05-18 MED ORDER — MIDAZOLAM HCL 2 MG/2ML IJ SOLN
INTRAMUSCULAR | Status: DC | PRN
  Administered 2021-05-18: .5 mg via INTRAVENOUS

## 2021-05-18 MED ORDER — INSULIN ASPART 100 UNIT/ML IJ SOLN
0.0000 [IU] | INTRAMUSCULAR | Status: DC
  Administered 2021-05-18: 2 [IU] via SUBCUTANEOUS
  Administered 2021-05-19: 5 [IU] via SUBCUTANEOUS
  Administered 2021-05-19: 2 [IU] via SUBCUTANEOUS
  Administered 2021-05-19 (×5): 3 [IU] via SUBCUTANEOUS
  Administered 2021-05-20: 2 [IU] via SUBCUTANEOUS
  Administered 2021-05-20: 3 [IU] via SUBCUTANEOUS
  Administered 2021-05-20 (×2): 2 [IU] via SUBCUTANEOUS
  Administered 2021-05-20 – 2021-05-21 (×4): 3 [IU] via SUBCUTANEOUS

## 2021-05-18 MED ORDER — SODIUM CHLORIDE 0.9 % IV SOLN: INTRAVENOUS | Status: DC | PRN

## 2021-05-18 MED ORDER — SODIUM CHLORIDE 0.9 % IV SOLN
INTRAVENOUS | Status: AC | PRN
  Administered 2021-05-18: 10 mL/h via INTRAVENOUS

## 2021-05-18 MED ORDER — SODIUM CHLORIDE 0.9 % IV SOLN: INTRAVENOUS | Status: DC

## 2021-05-18 MED ORDER — NOREPINEPHRINE 4 MG/250ML-% IV SOLN
0.0000 ug/min | INTRAVENOUS | Status: DC
  Administered 2021-05-19: 6 ug/min via INTRAVENOUS
  Administered 2021-05-20: 10 ug/min via INTRAVENOUS
  Administered 2021-05-20: 4 ug/min via INTRAVENOUS
  Administered 2021-05-20: 8 ug/min via INTRAVENOUS
  Filled 2021-05-18 (×5): qty 250

## 2021-05-18 MED ORDER — LABETALOL HCL 5 MG/ML IV SOLN: 10.0000 mg | INTRAVENOUS | Status: AC | PRN

## 2021-05-18 MED ORDER — SODIUM CHLORIDE 0.9% FLUSH: 3.0000 mL | Freq: Two times a day (BID) | INTRAVENOUS | Status: DC

## 2021-05-18 MED ORDER — HEPARIN (PORCINE) IN NACL 1000-0.9 UT/500ML-% IV SOLN
INTRAVENOUS | Status: AC
  Filled 2021-05-18: qty 1000

## 2021-05-18 SURGICAL SUPPLY — 9 items
CATH 5FR JL3.5 JR4 ANG PIG MP (CATHETERS) ×1 IMPLANT
CATH BALLN WEDGE 5F 110CM (CATHETERS) ×1 IMPLANT
DEVICE RAD COMP TR BAND LRG (VASCULAR PRODUCTS) ×1 IMPLANT
GLIDESHEATH SLEND SS 6F .021 (SHEATH) ×1 IMPLANT
GUIDEWIRE .025 260CM (WIRE) ×1 IMPLANT
KIT HEART LEFT (KITS) ×2 IMPLANT
PACK CARDIAC CATHETERIZATION (CUSTOM PROCEDURE TRAY) ×2 IMPLANT
SHEATH GLIDE SLENDER 4/5FR (SHEATH) ×1 IMPLANT
TRANSDUCER W/STOPCOCK (MISCELLANEOUS) ×2 IMPLANT

## 2021-05-18 NOTE — Progress Notes (Addendum)
[address]       [address] [address]             [phone]      12 Days Post-Op Procedure(s) (LRB): CORONARY ARTERY BYPASS GRAFTING (CABG) x3 on pump using left internal mammary artery and right endoscopic greater saphenous vein conduit. (N/A) TRANSESOPHAGEAL ECHOCARDIOGRAM (TEE) (N/A) APPLICATION OF CELL SAVER ENDOVEIN HARVEST OF GREATER SAPHENOUS VEIN (Right) Subjective:   Moved back to ICU yesterday for hypotension and probable aspiration pneumonia related to persistent hiccups.  Awake, alert, and sitting up in bed. No new concerns.   O2 requirement increased from 6L to 10L after getting up to go to the bathroom this morning.   Objective: Vital signs in last 24 hours: Temp:  [97.5 F (36.4 C)-98.2 F (36.8 C)] 97.9 F (36.6 C) (01/30 0734) Pulse Rate:  [82-120] 120 (01/30 0600) Cardiac Rhythm: Atrial fibrillation (01/30 0000) Resp:  [18-31] 22 (01/30 0600) BP: (58-124)/(38-69) 115/64 (01/30 0600) SpO2:  [87 %-100 %] 93 % (01/30 0600) CVP:  [6 mmHg-7 mmHg] 6 mmHg  Intake/Output from previous day: 01/29 0701 - 01/30 0700 In: 867.8 [I.V.:183.3; IV Piggyback:684.5] Out: 700 [Urine:700] Intake/Output this shift: No intake/output data recorded.  General appearance: alert, cooperative, and no distress.  No increased WOB now.  Neurologic: intact Heart:EKG showing a-flutter this morning.   Lungs: breath diminished. O2 sats adequate on 9L/ O2.  CXR showing worsening diffuse bilateral opacities. Abdomen: soft, non-tender.  Extremities: 1-2+ LE edema. The RLE EVH incision is intact and dry.  Wound: the sternotomy incision is well approximated and dry.   Lab Results: Recent Labs    05/17/21 1818 05/17/2021 0314  WBC  --  19.4*  HGB 12.6* 8.7*  HCT 37.0* 26.1*  PLT  --  301    BMET:  Recent Labs    05/17/21 1733 05/17/21 1818 05/01/2021 0314  NA 127* 127* 127*  K 3.5 3.3* 3.6  CL 85*  --  84*  CO2 32  --  30  GLUCOSE 126*  --  206*   BUN 34*  --  36*  CREATININE 2.46*  --  2.11*  CALCIUM 7.6*  --  7.9*     PT/INR: No results for input(s): LABPROT, INR in the last 72 hours. ABG    Component Value Date/Time   PHART 7.514 (H) 05/17/2021 1818   HCO3 34.1 (H) 05/17/2021 1818   TCO2 35 (H) 05/17/2021 1818   ACIDBASEDEF 3.0 (H) 05/08/2021 0323   O2SAT 58.4 05/13/2021 0321   CBG (last 3)  Recent Labs    05/17/21 0958  GLUCAP 128*      Assessment/Plan: S/P Procedure(s) (LRB): CORONARY ARTERY BYPASS GRAFTING (CABG) x3 on pump using left internal mammary artery and right endoscopic greater saphenous vein conduit. (N/A) TRANSESOPHAGEAL ECHOCARDIOGRAM (TEE) (N/A) APPLICATION OF CELL SAVER ENDOVEIN HARVEST OF GREATER SAPHENOUS VEIN (Right)  -POD-12 CABG x 3 presenting with acute STEMI . EF 45% with global hypokinesis on echo 1/25, very small pericardial effusion. On ASA, metoprolol, and Crestor. BP stable.  -Paroxysmal a-fib / a-flutter- on amiodarone infusion.  Cardiology repeating ECHO to r/o enlargement of pericardial effusion. Anticoagulation on hold.  -GI- persistent hiccups. GI following. Better with PRN IV Valium. Work up in progress. SLP eval also requested.   -PULM- Suspected aspiration pneumonia. On Maxipime.  Sputum Cx pending,  -Acute renal insufficiency- Creat trending down, UO adequate. Monitor.      LOS: 13 days  Antony Odea, PA-C [phone] 05/19/2021  Drop in Hb inpast 36 hrs prob dilutional as patient was diuresed aggressively to clear bilateral opacities  then required 2 L fluid for low BP yesterday. Main problem is aspiration related to persistent hiccups postop with bilat pneumonia on iv Maxipime. O2 sats are borderline.  Echo today reviewed and small pericardial effusion persists but doubt tamponade as cvp low (5).Will discuss with cardiology and consider RHC  Cont npo, iv antibiotics, iv amiodarone, swallow study and place Cortrak feeding tube for nutrition if  indicated   P Prescott Gum MD

## 2021-05-18 NOTE — Progress Notes (Signed)
Echocardiogram 2D Echocardiogram has been performed.  Arlyss Gandy 05/10/2021, 8:34 AM

## 2021-05-18 NOTE — Progress Notes (Signed)
RHC/hemodynamic LHC done today: 1. RA pressure increased, PCWP normal range.  2. Equalization of diastolic pressures (RA, PCWP, LVEDP, RVEDP).  3. Preserved cardiac output.  4. Simultaneous RV/LV tracings were obtained.  Difficult to interpret due to atrial fibrillation.  There was some suggestion of discordance (ventricular interdependence) but not clear.        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  Simultaneous RV/LV tracings are not definitive for ventricular interdependence, however.   I discussed situation with Dr. Prescott Gum.  I think he is going to need pericardial stripping/clean out prior to discharge given strong suggestion of effusive/constrictive pericarditis by echo and RHC.  For the time being, will try to get PNA treated and improve nutrition (has feeding tube now).  I spoke with his wife and told her the plan.   Loralie Champagne 05/17/2021 5:08 PM

## 2021-05-18 NOTE — Progress Notes (Signed)
Pharmacy Antibiotic Note  Randall Hodges is a 71 y.o. male admitted on 05/10/2021 with pneumonia.  Pharmacy has been consulted for Zosyn dosing. WBC is trending up, CXR suggestive of bilateral multifocal pulmonary infiltrates, and requiring low dose levophed to maintain MAPs. Patient noted to have dark colored urine on 09/08/20 with use of amox/clav.  Plan: Zosyn 3.375g IV q8h (4 hour infusion). Continue to monitor for renal function, s/sx of clinical improvement, and culture results to narrow abx. Continue to monitor for side effects.  Height: 5\' 10"  (177.8 cm) Weight: 81.1 kg (178 lb 12.7 oz) IBW/kg (Calculated) : 73  Temp (24hrs), Avg:98.1 F (36.7 C), Min:97.9 F (36.6 C), Max:98.2 F (36.8 C)  Recent Labs  Lab 05/12/21 0139 05/13/21 0056 05/14/21 0148 05/15/21 0117 05/17/21 0413 05/17/21 1733 05/04/2021 0314  WBC 14.1* 14.5* 12.4* 13.0*  --   --  19.4*  CREATININE 1.23 1.07 1.04 1.16 1.79* 2.46* 2.11*    Estimated Creatinine Clearance: 33.2 mL/min (A) (by C-G formula based on SCr of 2.11 mg/dL (H)).    Allergies  Allergen Reactions   Amoxicillin-Pot Clavulanate     Per pt report on 08/19/20, makes his urine dark colored   Atorvastatin Other (See Comments)     ( pt states causing runny nose, headaches, issue w/ urination)   Plant Derived Enzymes     Other reaction(s): Unknown   Trichophyton Other (See Comments)    Antimicrobials this admission: Cefepime 1/29 >> 1/30 Zosyn 1/30 >>   Dose adjustments this admission: None  Microbiology results: 1/30 Sputum: sent  1/17 MRSA PCR: negative   Thank you for allowing pharmacy to be a part of this patients care.  Varney Daily, PharmD PGY1 Pharmacy Resident  Please check AMION for all [facility] pharmacy phone numbers After 10:00 PM call main pharmacy [phone]

## 2021-05-18 NOTE — Progress Notes (Signed)
MD requested 20g brachial PIV for cath lab, the brachial vein was not appropriate for a 20g IV.  Notified nurse that patient has a basilic that PIV/midline could be placed. At this time she declined and stated she would notify MD. Instructed nurse to re-consult [facility] for further IV access. Also, notified to removed RFA IV d/t have PICC line to upper R arm. VU. Fran Lowes, RN [facility]

## 2021-05-18 NOTE — Progress Notes (Signed)
SLP Cancellation Note  Patient Details Name: Randall Hodges MRN: [id number] DOB: August 08, 1950   Cancelled evaluation: Pt with medical issues prohibiting assessment: hypotension, increased 02 requirement. Spoke with RN. Cortrak will be placed today and SLP will see at bedside next date for clinical swallowing assessment.  [name] L. Tivis Ringer, [place] CCC/SLP Acute Rehabilitation Services Office number [phone] Pager [phone]          [name] 04/27/2021, 1:52 PM

## 2021-05-18 NOTE — Progress Notes (Addendum)
Patient ID: Randall Hodges, male   DOB: 11/16/1950, 71 y.o.   MRN: [id number]    Progress Note  Patient Name: Randall Hodges Date of Encounter: 04/22/2021  [facility] Cardiologist: [name], MD   Subjective   Hiccupping improved on valium.   Creatinine up to 2.4 yesterday, cardiac meds held and given IVF.  Creatinine down to 2.1.  CXR with bilateral LL infiltrates, started on cefepime with concern for aspiration PNA/pnuemonitis from hiccupping.  He has been kept NPO.  This morning, he is on 6L oxygen by [place].  CVP 4-5 and co-ox adequate at 58%.   Afebrile, WBCs 19.   He appears to be in atrial fibrillation versus atypical flutter this morning with PVCs.  On amiodarone gtt.  Anticoagulation held pending repeat echo to make sure pericardial effusion not larger.   Inpatient Medications    Scheduled Meds:  aspirin EC  81 mg Oral Daily   chlorhexidine  15 mL Mouth Rinse BID   Chlorhexidine Gluconate Cloth  6 each Topical Daily   colchicine  0.6 mg Oral Daily   diazepam  1.25 mg Intravenous QHS   enoxaparin (LOVENOX) injection  40 mg Subcutaneous Q24H   feeding supplement  237 mL Oral TID WC   guaiFENesin  1,200 mg Oral BID   mouth rinse  15 mL Mouth Rinse q12n4p   pantoprazole  40 mg Oral BID   potassium chloride  40 mEq Oral Daily   rosuvastatin  10 mg Oral Daily   sodium chloride flush  10-40 mL Intracatheter Q12H   sodium chloride flush  10-40 mL Intracatheter Q12H   sodium chloride flush  3 mL Intravenous Q12H   Continuous Infusions:  sodium chloride     amiodarone 30 mg/hr (04/28/2021 0400)   ceFEPime (MAXIPIME) IV 1 g (05/17/21 1900)   PRN Meds: diazepam, metoprolol tartrate, ondansetron (ZOFRAN) IV, sodium chloride flush, sodium chloride flush, sodium chloride flush, traMADol   Vital Signs    Vitals:   04/27/2021 0400 04/21/2021 0500 05/04/2021 0600 04/24/2021 0734  BP:  124/67 115/64   Pulse: 86 89 (!) 120   Resp: (!) 27 (!) 21 (!) 22   Temp:    97.9 F (36.6  C)  TempSrc:    Oral  SpO2: 100% 93% 93%   Weight:      Height:        Intake/Output Summary (Last 24 hours) at 05/17/2021 0741 Last data filed at 05/09/2021 0400 Gross per 24 hour  Intake 867.76 ml  Output 700 ml  Net 167.76 ml   Last 3 Weights 05/17/2021 05/16/2021 05/13/2021  Weight (lbs) 178 lb 12.7 oz 181 lb 8 oz 191 lb 5.8 oz  Weight (kg) 81.1 kg 82.328 kg 86.8 kg      Telemetry    Atrial fibrillation versus atypical flutter rate 90s - Personally Reviewed  Physical Exam   General: NAD Neck: No JVD, no thyromegaly or thyroid nodule.  Lungs: Clear to auscultation bilaterally with normal respiratory effort. CV: Nondisplaced PMI.  Heart regular S1/S2, no S3/S4, no murmur.  1+ ankle edema.   Abdomen: Soft, nontender, no hepatosplenomegaly, no distention.  Skin: Intact without lesions or rashes.  Neurologic: Alert and oriented x 3.  Psych: Normal affect. Extremities: No clubbing or cyanosis.  HEENT: Normal.   Labs    High Sensitivity Troponin:   Recent Labs  Lab 04/21/2021 1321 05/01/2021 1518  TROPONINIHS 3 4     Chemistry Recent Labs  Lab 05/15/21  1610 05/17/21 0413 05/17/21 1619 05/17/21 1733 05/17/21 1818 05/08/2021 0314  NA 128* 128*   < > 127* 127* 127*  K 3.8 3.4*   < > 3.5 3.3* 3.6  CL 86* 82*  --  85*  --  84*  CO2 33* 31  --  32  --  30  GLUCOSE 135* 124*  --  126*  --  206*  BUN 18 26*  --  34*  --  36*  CREATININE 1.16 1.79*  --  2.46*  --  2.11*  CALCIUM 8.2* 8.1*  --  7.6*  --  7.9*  MG 1.8  --   --  1.9  --   --   GFRNONAA >60 40*  --  27*  --  33*  ANIONGAP 9 15  --  10  --  13   < > = values in this interval not displayed.    Lipids  Recent Labs  Lab 05/13/21 0056  CHOL 71  TRIG 56  HDL 25*  LDLCALC 35  CHOLHDL 2.8    Hematology Recent Labs  Lab 05/14/21 0148 05/15/21 0117 05/17/21 1619 05/17/21 1818 04/30/2021 0314  WBC 12.4* 13.0*  --   --  19.4*  RBC 3.16* 3.17*  --   --  2.92*  HGB 9.6* 9.6* 9.9* 12.6* 8.7*  HCT 28.7*  27.9* 29.0* 37.0* 26.1*  MCV 90.8 88.0  --   --  89.4  MCH 30.4 30.3  --   --  29.8  MCHC 33.4 34.4  --   --  33.3  RDW 13.7 13.5  --   --  13.9  PLT 174 218  --   --  301   Thyroid No results for input(s): TSH, FREET4 in the last 168 hours.  BNPNo results for input(s): BNP, PROBNP in the last 168 hours.  DDimer No results for input(s): DDIMER in the last 168 hours.   Radiology    DG Chest Port 1 View  Result Date: 05/11/2021 CLINICAL DATA:  Shortness of breath. EXAM: PORTABLE CHEST 1 VIEW COMPARISON:  Chest XR, most recently 05/17/2020. CT chest, 06/03/2017. FINDINGS: Support lines: Interval placement of RIGHT upper extremity PICC catheter tip at the superior cavoatrial junction. Cardiomediastinal silhouette is enlarged. Hypoinflation with increasing conspicuity of bilateral pulmonary opacities. Small volume pleural effusions. No pneumothorax. Known RIGHT rib deformities. No interval osseous abnormality. IMPRESSION: 1. New, well-positioned RIGHT upper extremity PICC with tip at the superior cavoatrial junction 2. Increased conspicuity of bilateral pulmonary opacities. Findings favored to represent multifocal pneumonia with superimposed pulmonary edema. Electronically Signed   By: Michaelle Birks M.D.   On: 05/02/2021 06:19   DG CHEST PORT 1 VIEW  Result Date: 05/17/2021 CLINICAL DATA:  Follow-up exam.  Pleural effusion. EXAM: PORTABLE CHEST 1 VIEW COMPARISON:  05/14/2021 and older studies. FINDINGS: Stable changes from prior CABG surgery. Cardiac silhouette mildly enlarged. Bilateral interstitial and airspace lung opacities are noted, with a possible mild increase in airspace opacity in the right mid lung, otherwise unchanged. Possible small effusions.  No pneumothorax. No change in posterior right first and second rib fractures IMPRESSION: 1. Mild increase in airspace lung opacity in the right mid lung compared to the most recent prior study. 2. No other change. Remaining airspace lung opacities  are stable consistent with multifocal pneumonia. Electronically Signed   By: Lajean Manes M.D.   On: 05/17/2021 10:53   [place] EKG SITE RITE  Result Date: 05/17/2021 If [facility] image not attached,  placement could not be confirmed due to current cardiac rhythm.   Cardiac Studies   Cath 05/15/2021 Distal left main [place] 111 bifurcation stenosis with 75% left main, 90% ostial to proximal LAD, and 80-90% ostial circumflex (difficult to assess due to heavy calcification). Severe mid circumflex disease with 70% eccentric mid stenosis and second obtuse marginal containing ostial to proximal greater than 80% stenosis.  (Bifurcation [place] 111 Severe calcification in left main and LAD in particular with diffuse 50% narrowing from proximal to mid vessel and tandem 70% stenoses in the mid LAD. Nondominant right coronary Normal LV function.  EF 55%.  LVEDP normal.    Patient Profile     71 y.o. male with PMH of PVCs presented with chest pain. Cardiac cath by Dr. Tamala Julian on 05/04/2021 showed 75% left main, 90% ost to prox LAD, 80-90% ost LCx, 70% mid LCx, 80% OM2, 50% prox to mid LAD, 70% mid LAD, EF 55%. Patient underwent CABG x 3 on 04/27/2021. Post op course complicated afib, treated with amio. CXR showed opacity in bilateral lung, started abx on 1/25 and diuretic. Started on Eliquis due to recurrence of afib.    Assessment & Plan    1. CAD: Admitted with unstable angina, cath with severe left main and proximal LAD/LCx disease (nondominant RCA).  CABG x 3 on 1/18 with LIMA-LAD, SVG-OM, SVG-left PDA.   - Continue ASA 81, statin.  2. 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  He has mild pleuritic chest pain, denies dyspnea.  Deveoped hypotension/AKI after aggressive diuresis, BP-active  meds and Lasix held. Creatinine lower at 2.1 today.  CVP 4-5 with co-ox 58%.. - While NPO, will give NS 75 cc/hr x 8 hrs.  - Hold Coreg and Entresto for now.  - With pericardial abnormality, he is on colchicine => ?component of post-pericardiotomy syndrome based on echo.  - Repeat limited echo to make sure pericardial effusion is not enlarging.  3. Hiccups: Intractable, this has been his main complaint. GI following, started on gabapentin and valium with some improvement.   - Will need upper GI series.  4. PNA: CXR with bilateral lower lobe infiltrates and significant oxygen requirement with low CVP (4-5).  Suspect aspiration PNA/pneumonitis in setting of intractable hiccups. Afebrile, WBCs 19.  - Now covering with cefepime.  - Check procalcitonin.  - Sputum culture if able to produce.  - NPO pending swallow evaluation.  5. Atrial fibrillation: Paroxysmal. Back in AF with controlled rate this morning.  - Continue amiodarone 30 mg/hr gtt.  - Limited echo to make sure no worsening pericardial effusion, restart anticoagulation if ok.   6. AKI: Creatinine up to 2.47 after aggressive diuresis and drop in BP.  Creatinine now down to 2.1 with hydration yesterday.  CVP low at 4-5.  - Hold BP-active meds.  - NSR 75 cc/hr x 8 hrs while NPO.  7. Hyponatremia: Suspect hypovolemic hypernatremia.  NS as above.   CRITICAL CARE Performed by: Loralie Champagne  Total critical care time: 35 minutes  Critical care time was exclusive of separately billable procedures and treating other patients.  Critical care was necessary to treat or prevent imminent or life-threatening deterioration.  Critical care was time spent personally by me on the following activities: development of treatment plan with patient and/or surrogate as well as nursing, discussions with consultants, evaluation  of patient's response to treatment, examination of patient, obtaining history from patient or surrogate, ordering and performing  treatments and interventions, ordering and review of laboratory studies, ordering and review of radiographic studies, pulse oximetry and re-evaluation of patient's condition.   Loralie Champagne 04/23/2021 7:41 AM

## 2021-05-18 NOTE — Progress Notes (Addendum)
Daily Rounding Note  05/03/2021, 9:47 AM  LOS: 13 days   SUBJECTIVE:   Chief complaint:    Hiccups post CABG Hiccups persist.  Makes it difficult for him to swallow so he is now n.p.o.  No nausea.  Oxygen requirement went from 6 L to 10 L when he was moved from the bed to the bedside commode so Dr. Darcey Nora does not want patient leaving the floor for any radiologic studies.  RN reports that the Valium is providing some relief.  OBJECTIVE:         Vital signs in last 24 hours:    Temp:  [97.9 F (36.6 C)-98.2 F (36.8 C)] 97.9 F (36.6 C) (01/30 0734) Pulse Rate:  [82-120] 120 (01/30 0600) Resp:  [18-31] 22 (01/30 0600) BP: (58-124)/(38-69) 115/64 (01/30 0600) SpO2:  [87 %-100 %] 93 % (01/30 0600) Last BM Date: 05/16/21 Filed Weights   05/13/21 0600 05/16/21 0550 05/17/21 0321  Weight: 86.8 kg 82.3 kg 81.1 kg   General: Looks ill.  Intermittently hiccuping Heart: Irregular.  Rate in mid 90s. Chest: Difficulty breathing and speaking because of hiccups.  Depressed breath sounds on right.  No cough.  Sternotomy scar intact and bruising surrounding sternal incision. Abdomen: A bit protuberant but soft.  Bowel sounds active. Neuro/Psych: Alert.  Not confused.  Intake/Output from previous day: 01/29 0701 - 01/30 0700 In: 867.8 [I.V.:183.3; IV Piggyback:684.5] Out: 700 [Urine:700]  Intake/Output this shift: No intake/output data recorded.  Lab Results: Recent Labs    05/17/21 1619 05/17/21 1818 05/10/2021 0314  WBC  --   --  19.4*  HGB 9.9* 12.6* 8.7*  HCT 29.0* 37.0* 26.1*  PLT  --   --  301   BMET Recent Labs    05/17/21 0413 05/17/21 1619 05/17/21 1733 05/17/21 1818 05/01/2021 0314  NA 128*   < > 127* 127* 127*  K 3.4*   < > 3.5 3.3* 3.6  CL 82*  --  85*  --  84*  CO2 31  --  32  --  30  GLUCOSE 124*  --  126*  --  206*  BUN 26*  --  34*  --  36*  CREATININE 1.79*  --  2.46*  --  2.11*  CALCIUM 8.1*   --  7.6*  --  7.9*   < > = values in this interval not displayed.   LFT No results for input(s): PROT, ALBUMIN, AST, ALT, ALKPHOS, BILITOT, BILIDIR, IBILI in the last 72 hours. PT/INR No results for input(s): LABPROT, INR in the last 72 hours. Hepatitis Panel No results for input(s): HEPBSAG, HCVAB, HEPAIGM, HEPBIGM in the last 72 hours.  Studies/Results: DG Chest Port 1 View  Result Date: 04/21/2021 CLINICAL DATA:  Shortness of breath. EXAM: PORTABLE CHEST 1 VIEW COMPARISON:  Chest XR, most recently 05/17/2020. CT chest, 06/03/2017. FINDINGS: Support lines: Interval placement of RIGHT upper extremity PICC catheter tip at the superior cavoatrial junction. Cardiomediastinal silhouette is enlarged. Hypoinflation with increasing conspicuity of bilateral pulmonary opacities. Small volume pleural effusions. No pneumothorax. Known RIGHT rib deformities. No interval osseous abnormality. IMPRESSION: 1. New, well-positioned RIGHT upper extremity PICC with tip at the superior cavoatrial junction 2. Increased conspicuity of bilateral pulmonary opacities. Findings favored to represent multifocal pneumonia with superimposed pulmonary edema. Electronically Signed   By: Michaelle Birks M.D.   On: 05/01/2021 06:19   DG CHEST PORT 1 VIEW  Result Date: 05/17/2021 CLINICAL DATA:  Follow-up exam.  Pleural effusion. EXAM: PORTABLE CHEST 1 VIEW COMPARISON:  05/14/2021 and older studies. FINDINGS: Stable changes from prior CABG surgery. Cardiac silhouette mildly enlarged. Bilateral interstitial and airspace lung opacities are noted, with a possible mild increase in airspace opacity in the right mid lung, otherwise unchanged. Possible small effusions.  No pneumothorax. No change in posterior right first and second rib fractures IMPRESSION: 1. Mild increase in airspace lung opacity in the right mid lung compared to the most recent prior study. 2. No other change. Remaining airspace lung opacities are stable consistent with  multifocal pneumonia. Electronically Signed   By: Lajean Manes M.D.   On: 05/17/2021 10:53   [place] EKG SITE RITE  Result Date: 05/17/2021 If Site Rite image not attached, placement could not be confirmed due to current cardiac rhythm.   Scheduled Meds:  aspirin EC  81 mg Oral Daily   chlorhexidine  15 mL Mouth Rinse BID   Chlorhexidine Gluconate Cloth  6 each Topical Daily   colchicine  0.6 mg Oral Daily   diazepam  1.25 mg Intravenous QHS   enoxaparin (LOVENOX) injection  40 mg Subcutaneous Q24H   guaiFENesin  1,200 mg Oral BID   insulin aspart  0-15 Units Subcutaneous Q4H   mouth rinse  15 mL Mouth Rinse q12n4p   metoCLOPramide (REGLAN) injection  10 mg Intravenous Q6H   pantoprazole (PROTONIX) IV  40 mg Intravenous Daily   rosuvastatin  10 mg Oral Daily   sodium chloride flush  10-40 mL Intracatheter Q12H   sodium chloride flush  10-40 mL Intracatheter Q12H   sodium chloride flush  3 mL Intravenous Q12H   Continuous Infusions:  sodium chloride 75 mL/hr at 04/25/2021 0817   amiodarone 30 mg/hr (05/17/2021 0400)   amiodarone     ceFEPime (MAXIPIME) IV 1 g (05/04/2021 0914)   dextrose 5 % and 0.9 % NaCl with KCl 20 mEq/L     potassium chloride 10 mEq (05/11/2021 0908)   PRN Meds:.diazepam, metoprolol tartrate, ondansetron (ZOFRAN) IV, sodium chloride flush, sodium chloride flush, sodium chloride flush, traMADol   ASSESMENT:   Hiccups post op.  On Protonix, gabapentin, IV Valium.  Had hallucinations after baclofen.  These persist though marginally improved with current meds.  Has not had EGD.  Prn omeprazole and OTC Pepcid PTA.    ACS/STEMI.  3  V CABG  1/18.  Most recent EF 45%.  Suspected aspiration, multifocal, bilateral pneumonia.  On Maxipime.  PAF, A. fib/flutter.  Amiodarone in place.  Was on Eliquis, not currently.    AKI  Hyponatremia.  Sodium 127.  Normocytic anemia.Hgb 14 when first presented but 11 within 24 hours.  Nadir 7.1.  12.6 yesterday, 8.7 today.  No overt  bleeding.  Unremarkable colonoscopy in [place] about 10 years ago.  No previous EGDs.   PLAN   SLP evaluation is pending to determine if patient safe to swallow.  For now Dr. Darcey Nora does not want patient leaving the floor for ordered upper GI series.  When patient is able to get to radiology, would add barium esophagram to the upper GI series.  It may be a day or 2 before that is acceptable to attending physician.   Given the anemia, recent open heart surgery, would chest CT to assess for hematoma be in order?    Azucena Freed  04/28/2021, 9:47 AM Phone [phone]

## 2021-05-18 NOTE — Interval H&P Note (Signed)
History and Physical Interval Note:  05/16/2021 4:07 PM  Randall Hodges  has presented today for surgery, with the diagnosis of tamponade.  The various methods of treatment have been discussed with the patient and family. After consideration of risks, benefits and other options for treatment, the patient has consented to  Procedure(s): RIGHT/LEFT HEART CATH AND CORONARY ANGIOGRAPHY (N/A) as a surgical intervention.  The patient's history has been reviewed, patient examined, no change in status, stable for surgery.  I have reviewed the patient's chart and labs.  Questions were answered to the patient's satisfaction.     Nya Monds [employer]

## 2021-05-18 NOTE — TOC Benefit Eligibility Note (Signed)
Patient Teacher, English as a foreign language completed.    The patient is currently admitted and upon discharge could be taking Entresto 24-26 mg.  The current 30 day co-pay is, $45.00.   The patient is currently admitted and upon discharge could be taking Farxiga 10 mg.  The current 30 day co-pay is, $45.00.   The patient is currently admitted and upon discharge could be taking Jardiance 10 mg.  The current 30 day co-pay is, $45.00.   The patient is insured through [place], [place] Patient Advocate Specialist [place] Patient Advocate Team Direct Number: [phone]  Fax: [phone]

## 2021-05-18 NOTE — Consult Note (Signed)
NAME:  Randall Hodges, MRN:  [id number], DOB:  1950/05/24, LOS: 69 ADMISSION DATE:  05/11/2021, CONSULTATION DATE: 05/13/2021 REFERRING MD:  Dahlia Byes, MD, CHIEF COMPLAINT: Increasing shortness of breath  History of Present Illness:  71 year old male with coronary artery disease who initially presented with chest pain, noted to have multivessel coronary artery disease, he underwent CABG x3 on 04/1818 23, course was complicated with atrial fibrillation, frequent hiccups and aspiration pneumonia leading to hypoxia and increasing oxygen requirement. PCCM was consulted for evaluation and help with management  Patient stated hiccups are better but continued complain of shortness of breath, cough unable to bring up phlegm.  Denies chest pain or palpitation During my evaluation patient is on high flow nasal cannula oxygen at 12 L and he is requiring low-dose Levophed to maintain MAP above 65.  Of note patient had echocardiogram repeated today, consistent with constrictive picture, he is a scheduled to go to Cath Lab for possible pericardial stripping/window  Pertinent  Medical History   Past Medical History:  Diagnosis Date   Allergic rhinitis    Prostate cancer ([place])      Significant Hospital Events: Including procedures, antibiotic start and stop dates in addition to other pertinent events     Interim History / Subjective:    Objective   Blood pressure (!) 82/68, pulse 94, temperature 98.2 F (36.8 C), temperature source Oral, resp. rate (!) 29, height 5\' 10"  (1.778 m), weight 81.1 kg, SpO2 100 %. CVP:  [5 mmHg-11 mmHg] 11 mmHg      Intake/Output Summary (Last 24 hours) at 04/23/2021 1343 Last data filed at 05/04/2021 1117 Gross per 24 hour  Intake 867.76 ml  Output 925 ml  Net -57.24 ml   Filed Weights   05/13/21 0600 05/16/21 0550 05/17/21 0321  Weight: 86.8 kg 82.3 kg 81.1 kg    Examination: Physical exam: General: Acute on chronically ill-appearing male, lying on  the bed.  On high flow nasal cannula oxygen HEENT: [place]/AT, eyes anicteric.  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 fib Acute kidney injury Hyponatremia  TCTS and heart failure team are following Patient is scheduled for Cath Lab and possible stripping of pericardium GI is following for hiccups, continue Valium and Reglan Patient requiring high flow nasal cannula oxygen X-ray chest is suggestive of bilateral multifocal pulmonary infiltrates Cefepime was switched to IV Zosyn to cover for anaerobes and gram-negative His white count is trending up, currently in 19, he is requiring low-dose Levophed to maintain map goal 65 Heart rate remains well controlled, currently in sinus rhythm Serum creatinine started improving after gentle IV fluid hydration Monitor intake and output Avoid nephrotoxic agent Continue to closely monitor electrolytes  PCCM will continue to follow along, please call with questions Best Practice (right click and "Reselect all SmartList Selections" daily)   Diet/type: NPO DVT prophylaxis: SCD GI prophylaxis: PPI Lines: N/A Foley:  N/A Code Status:  full code Last date of multidisciplinary goals of care discussion [Per primary team]  Labs   CBC: Recent Labs  Lab 05/12/21 0139 05/13/21  6283 05/14/21 0148 05/15/21 0117 05/17/21 1619 05/17/21 1818 04/27/2021 0314  WBC 14.1* 14.5* 12.4* 13.0*  --   --  19.4*  HGB 10.3* 9.6* 9.6* 9.6* 9.9* 12.6*  8.7*  HCT 29.6* 27.8* 28.7* 27.9* 29.0* 37.0* 26.1*  MCV 88.6 89.1 90.8 88.0  --   --  89.4  PLT 141* 153 174 218  --   --  151    Basic Metabolic Panel: Recent Labs  Lab 05/14/21 0148 05/15/21 0117 05/17/21 0413 05/17/21 1619 05/17/21 1733 05/17/21 1818 05/10/2021 0314  NA 129* 128* 128* 127* 127* 127* 127*  K 4.8 3.8 3.4* 3.4* 3.5 3.3* 3.6  CL 92* 86* 82*  --  85*  --  84*  CO2 25 33* 31  --  32  --  30  GLUCOSE 92 135* 124*  --  126*  --  206*  BUN 20 18 26*  --  34*  --  36*  CREATININE 1.04 1.16 1.79*  --  2.46*  --  2.11*  CALCIUM 8.4* 8.2* 8.1*  --  7.6*  --  7.9*  MG  --  1.8  --   --  1.9  --   --    GFR: Estimated Creatinine Clearance: 33.2 mL/min (A) (by C-G formula based on SCr of 2.11 mg/dL (H)). Recent Labs  Lab 05/13/21 0056 05/14/21 0148 05/15/21 0117 04/21/2021 0314 05/04/2021 0910  PROCALCITON  --   --   --   --  0.33  WBC 14.5* 12.4* 13.0* 19.4*  --     Liver Function Tests: No results for input(s): AST, ALT, ALKPHOS, BILITOT, PROT, ALBUMIN in the last 168 hours. No results for input(s): LIPASE, AMYLASE in the last 168 hours. No results for input(s): AMMONIA in the last 168 hours.  ABG    Component Value Date/Time   PHART 7.514 (H) 05/17/2021 1818   PCO2ART 42.4 05/17/2021 1818   PO2ART 59 (L) 05/17/2021 1818   HCO3 34.1 (H) 05/17/2021 1818   TCO2 35 (H) 05/17/2021 1818   ACIDBASEDEF 3.0 (H) 05/08/2021 0323   O2SAT 58.4 05/01/2021 0321     Coagulation Profile: No results for input(s): INR, PROTIME in the last 168 hours.  Cardiac Enzymes: No results for input(s): CKTOTAL, CKMB, CKMBINDEX, TROPONINI in the last 168 hours.  HbA1C: Hgb A1c MFr Bld  Date/Time Value Ref Range Status  05/09/2021 09:26 AM 6.1 (H) 4.8 - 5.6 % Final    Comment:    (NOTE)         Prediabetes: 5.7 - 6.4         Diabetes: >6.4         Glycemic control for adults with diabetes: <7.0     CBG: Recent Labs  Lab 05/12/21 2101 05/13/21 0600 05/17/21 0958  04/30/2021 0851 05/17/2021 1118  GLUCAP 141* 116* 128* 117* 127*    Review of Systems:   12 point review of system is significant for complaint mentioned HPI, rest is negative  Past Medical History:  He,  has a past medical history of Allergic rhinitis and Prostate cancer ([place]).   Surgical History:   Past Surgical History:  Procedure Laterality Date   CORONARY ARTERY BYPASS GRAFT N/A 05/14/2021   Procedure: CORONARY ARTERY BYPASS GRAFTING (CABG) x3 on pump using left internal mammary artery and right endoscopic greater saphenous vein conduit.;  Surgeon: Dahlia Byes, MD;  Location: [facility];  Service: Open Heart Surgery;  Laterality: N/A;   ENDOVEIN HARVEST OF GREATER SAPHENOUS VEIN  Right 05/19/2021   Procedure: ENDOVEIN HARVEST OF GREATER SAPHENOUS VEIN;  Surgeon: Dahlia Byes, MD;  Location: [facility];  Service: Open Heart Surgery;  Laterality: Right;   LEFT HEART CATH AND CORONARY ANGIOGRAPHY N/A 05/15/2021   Procedure: LEFT HEART CATH AND CORONARY ANGIOGRAPHY;  Surgeon: Belva Crome, MD;  Location: [facility];  Service: Cardiovascular;  Laterality: N/A;   PROSTATE BIOPSY     SHOULDER SURGERY     TEE WITHOUT CARDIOVERSION N/A 05/02/2021   Procedure: TRANSESOPHAGEAL ECHOCARDIOGRAM (TEE);  Surgeon: Dahlia Byes, MD;  Location: [facility];  Service: Open Heart Surgery;  Laterality: N/A;     Social History:   reports that he has never smoked. He has never used smokeless tobacco. He reports current alcohol use. He reports that he does not use drugs.   Family History:  His family history includes Diabetes in his father; Hypertension in his paternal grandmother; Prostate cancer (age of onset: 5) in his father; Prostate cancer (age of onset: 44) in his brother. There is no history of Breast cancer, Colon cancer, or Pancreatic cancer.   Allergies Allergies  Allergen Reactions   Amoxicillin-Pot Clavulanate     Per pt report on 08/19/20, makes his urine dark colored   Atorvastatin Other  (See Comments)     ( pt states causing runny nose, headaches, issue w/ urination)   Plant Derived Enzymes     Other reaction(s): Unknown   Trichophyton Other (See Comments)     Home Medications  Prior to Admission medications   Medication Sig Start Date End Date Taking? Authorizing Provider  albuterol (VENTOLIN HFA) 108 (90 Base) MCG/ACT inhaler Inhale 1-2 puffs into the lungs every 4 (four) hours as needed for wheezing or shortness of breath. 04/07/21  Yes [provider]  aspirin EC 81 MG tablet Take 1 tablet (81 mg total) by mouth daily. Swallow whole. 07/18/20  Yes [name], MD  cetirizine (ZYRTEC) 10 MG tablet Take 10 mg by mouth daily.   Yes [provider]  Famotidine-Ca Carb-Mag Hydrox (PEPCID COMPLETE PO) Take 1 capsule by mouth daily.   Yes [provider]  metoprolol tartrate (LOPRESSOR) 100 MG tablet Take 1 tablet (100 mg total) by mouth 2 (two) times daily. 04/30/21 07/29/21 Yes Minus Breeding, MD  omeprazole (PRILOSEC) 40 MG capsule Take 40 mg by mouth daily as needed (heartburn/indigestion). 03/09/18   [provider]  rosuvastatin (CRESTOR) 10 MG tablet Take 1 tablet (10 mg total) by mouth daily. 12/11/20 06/27/21  Leonie Man, MD     Critical care time:     Total critical care time: 41 minutes  Performed by: [place] care time was exclusive of separately billable procedures and treating other patients.   Critical care was necessary to treat or prevent imminent or life-threatening deterioration.   Critical care was time spent personally by me on the following activities: development of treatment plan with patient and/or surrogate as well as nursing, discussions with consultants, evaluation of patient's response to treatment, examination of patient, obtaining history from patient or surrogate, ordering and performing treatments and interventions, ordering and review of laboratory studies, ordering and review of  radiographic studies, pulse oximetry and re-evaluation of patient's condition.   Jacky Kindle MD [facility] See Amion for pager If no response to pager, please call [phone] until 7pm After 7pm, Please call E-link [phone]

## 2021-05-18 NOTE — Procedures (Signed)
Cortrak  Person Inserting Tube:  Esaw Dace, RD Tube Type:  Cortrak - 43 inches Tube Size:  10 Tube Location:  Left nare Initial Placement:  Postpyloric Secured by: Clip Technique Used to Measure Tube Placement:  Marking at nare/corner of mouth Cortrak Secured At:  96 cm   Cortrak Tube Team Note:  Consult received to place a Cortrak feeding tube.   Despite multiple attempts, unable to place bridle. Tube clipped/taped in place  X-ray is required, abdominal x-ray has been ordered by the Cortrak team. Please confirm tube placement before using the Cortrak tube.   If the tube becomes dislodged please keep the tube and contact the Cortrak team at [email or web address] (password [email or web address]) for replacement.  If after hours and replacement cannot be delayed, place a NG tube and confirm placement with an abdominal x-ray.    Kerman Passey MS, RDN, LDN, CNSC Registered Dietitian III Clinical Nutrition RD Pager and On-Call Pager Number Located in [place]

## 2021-05-18 NOTE — Progress Notes (Signed)
PT Cancellation Note  Patient Details Name: Randall Hodges MRN: [id number] DOB: 1950/07/08   Cancelled Treatment:    Reason Eval/Treat Not Completed: Medical issues which prohibited therapy Pt with noted events this AM including increased oxygen requirement and hypotension. Plan for cath lab this afternoon per chart review. Will follow up for therapy when pt is appropriate.  Wyona Almas, PT, DPT Acute Rehabilitation Services Pager [phone] Office [phone]    Deno Etienne 05/16/2021, 1:28 PM

## 2021-05-18 NOTE — Progress Notes (Signed)
Patient ID: Randall Hodges, male   DOB: March 23, 1951, 71 y.o.   MRN: [id number]  TCTS Evening Rounds:  Some hypotension earlier today but better this afternoon after fluid. On NE 4.  Sats 98% on 4L.  Had R/L cath for hemodynamics this afternoon that suggests possible constrictive pericarditis. PVT will decide about surgical treatment.  Treating for pneumonia now.   UO marginal.

## 2021-05-18 NOTE — Procedures (Signed)
Arterial Catheter Insertion Procedure Note  Randall Hodges  [id number]  10-08-1950  Date:05/17/2021  Time:1:16 PM    Provider Performing: Ned Grace    Procedure: Insertion of Arterial Line [id number]) without US guidance  Indication(s) Blood pressure monitoring and/or need for frequent ABGs  Consent Risks of the procedure as well as the alternatives and risks of each were explained to the patient and/or caregiver.  Consent for the procedure was obtained and is signed in the bedside chart  Anesthesia None   Time Out Verified patient identification, verified procedure, site/side was marked, verified correct patient position, special equipment/implants available, medications/allergies/relevant history reviewed, required imaging and test results available.   Sterile Technique Maximal sterile technique including full sterile barrier drape, hand hygiene, sterile gown, sterile gloves, mask, hair covering, sterile ultrasound probe cover (if used).   Procedure Description Area of catheter insertion was cleaned with chlorhexidine and draped in sterile fashion. With real-time ultrasound guidance an arterial catheter was placed into the left radial artery.  Appropriate arterial tracings confirmed on monitor.     Complications/Tolerance None; patient tolerated the procedure well.   EBL Minimal   Specimen(s) None

## 2021-05-18 NOTE — H&P (View-Only) (Signed)
Patient hypotensive late this am. SBP 70s-80s.   CVP 10-11.  O2 requirements up from 6L to 11L HF [place].  Has received 250 bolus NS + albumin X 2  Appears lethargic. He is easily aroused and follows commands.    Discussed with Dr. Aundra Dubin. Will start NE to maintain MAP > 70.   Planning for RHC with left-sided hemodynamics this afternoon d/t concern for constrictive pericarditis. Evidence of pericardial effusion on echo.   CCM consulted d/t increasing O2 requirements and for placement of arterial line (if respiratory therapy unavailable). Currently on abx for pneumonia. WBC 19. AF.  Marlyce Huge, PA-C 04/23/2021  Patient seen with PA, agree with the above note.   SBP dropped into 70s despite fluid bolus and albumin.  He is in atrial flutter.  CVP up to around 10-11 now.    Echo reviewed, there is clear respirophasic variation of the interventricular septum and marked respirophasic variation of E inflow velocity on doppler evaluation of the mitral valve.  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    Norepinephrine started at low dose, arterial line to be placed.    Patient will go to the cath lab for hemodynamic right/left heart cath to look for evidence of constrictive pericarditis physiology.  Discussed with Dr. Prescott Gum.   CRITICAL CARE Performed by: Loralie Champagne  Total critical care time: 30 minutes  Critical care time was exclusive of separately billable procedures and treating other patients.  Critical care was necessary to treat or prevent imminent or life-threatening deterioration.  Critical care was time spent personally by me on the following activities: development of treatment plan with patient and/or surrogate as well as nursing, discussions with consultants, evaluation of patient's  response to treatment, examination of patient, obtaining history from patient or surrogate, ordering and performing treatments and interventions, ordering and review of laboratory studies, ordering and review of radiographic studies, pulse oximetry and re-evaluation of patient's condition.  Loralie Champagne 05/07/2021 1:11 PM

## 2021-05-18 NOTE — Progress Notes (Addendum)
Patient hypotensive late this am. SBP 70s-80s.   CVP 10-11.  O2 requirements up from 6L to 11L HF [place].  Has received 250 bolus NS + albumin X 2  Appears lethargic. He is easily aroused and follows commands.    Discussed with Dr. Aundra Dubin. Will start NE to maintain MAP > 70.   Planning for RHC with left-sided hemodynamics this afternoon d/t concern for constrictive pericarditis. Evidence of pericardial effusion on echo.   CCM consulted d/t increasing O2 requirements and for placement of arterial line (if respiratory therapy unavailable). Currently on abx for pneumonia. WBC 19. AF.  Marlyce Huge, PA-C 04/22/2021  Patient seen with PA, agree with the above note.   SBP dropped into 70s despite fluid bolus and albumin.  He is in atrial flutter.  CVP up to around 10-11 now.    Echo reviewed, there is clear respirophasic variation of the interventricular septum and marked respirophasic variation of E inflow velocity on doppler evaluation of the mitral valve.  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    Norepinephrine started at low dose, arterial line to be placed.    Patient will go to the cath lab for hemodynamic right/left heart cath to look for evidence of constrictive pericarditis physiology.  Discussed with Dr. Prescott Gum.   CRITICAL CARE Performed by: Loralie Champagne  Total critical care time: 30 minutes  Critical care time was exclusive of separately billable procedures and treating other patients.  Critical care was necessary to treat or prevent imminent or life-threatening deterioration.  Critical care was time spent personally by me on the following activities: development of treatment plan with patient and/or surrogate as well as nursing, discussions with consultants, evaluation of patient's  response to treatment, examination of patient, obtaining history from patient or surrogate, ordering and performing treatments and interventions, ordering and review of laboratory studies, ordering and review of radiographic studies, pulse oximetry and re-evaluation of patient's condition.  Loralie Champagne 04/23/2021 1:11 PM

## 2021-05-18 NOTE — Progress Notes (Signed)
Initial Nutrition Assessment  DOCUMENTATION CODES:   Not applicable  INTERVENTION:   Tube Feeding via Cortrak (post-pyloric):  Vital 1.5 at 20 ml/hr today per MD Goal: Vital 1.5 at 60 ml/hr This provides 97 g of protein, 2160 kcals, 1094 mL of free water   NUTRITION DIAGNOSIS:   Inadequate oral intake related to inability to eat as evidenced by NPO status.  GOAL:   Patient will meet greater than or equal to 90% of their needs  MONITOR:   TF tolerance, Diet advancement, Labs, Weight trends  REASON FOR ASSESSMENT:   Rounds    ASSESSMENT:   71 yo male admitted with acute coronary syndrome, underwent CABG x 3 on 1/18; developed intractable hiccups/muscle spasms, aspiration pneumonia with respiratory failure. PMH includes prostate cancer  1/18 CABG x 3 1/29 Transferred back to ICU due to hypotension, worsening respiratory status  Pt going to cath lab today to evaluate for evidence of constrictive pericarditis physiology  Pt alert and oriented on my assessment today Pt currently on 12L HF[place]-aspiration pneumonia, requiring levophed +hiccups, abdominal/esophageal spasms.  GI consulted  Pt reports despite his hiccups, he has been able to eat some. Recorded po intake 0-100% of meals; NPO at present. Concern for aspiration, SLP to evaluate.   Cortrak placed, post pyloric position. Abd xray indicating near LOT. Taped/clipped in place; unable to place bridle.   Labs: sodium 127 (L) Meds: ss novolog, reglan     Diet Order:   Diet Order             Diet NPO time specified  Diet effective now                   EDUCATION NEEDS:   Education needs have been addressed  Skin:  Skin Assessment: Skin Integrity Issues: Skin Integrity Issues:: Incisions Incisions: sternum, leg, groin  Last BM:  1/30  Height:   Ht Readings from Last 1 Encounters:  04/29/2021 5\' 10"  (1.778 m)    Weight:   Wt Readings from Last 1 Encounters:  05/17/21 81.1 kg     BMI:  Body  mass index is 25.65 kg/m.  Estimated Nutritional Needs:   Kcal:  2000-2200 kcals  Protein:  100-115 g  Fluid:  1.8 L  Kerman Passey MS, RDN, LDN, CNSC Registered Dietitian III Clinical Nutrition RD Pager and On-Call Pager Number Located in [place]

## 2021-05-19 ENCOUNTER — Inpatient Hospital Stay (HOSPITAL_COMMUNITY): Payer: PPO

## 2021-05-19 ENCOUNTER — Encounter (HOSPITAL_COMMUNITY): Payer: Self-pay | Admitting: Cardiology

## 2021-05-19 ENCOUNTER — Encounter (HOSPITAL_COMMUNITY): Payer: Self-pay | Admitting: Certified Registered Nurse Anesthetist

## 2021-05-19 DIAGNOSIS — I249 Acute ischemic heart disease, unspecified: Secondary | ICD-10-CM | POA: Diagnosis not present

## 2021-05-19 DIAGNOSIS — R6521 Severe sepsis with septic shock: Secondary | ICD-10-CM | POA: Diagnosis not present

## 2021-05-19 DIAGNOSIS — I318 Other specified diseases of pericardium: Secondary | ICD-10-CM | POA: Diagnosis not present

## 2021-05-19 DIAGNOSIS — Z951 Presence of aortocoronary bypass graft: Secondary | ICD-10-CM | POA: Diagnosis not present

## 2021-05-19 DIAGNOSIS — N179 Acute kidney failure, unspecified: Secondary | ICD-10-CM | POA: Diagnosis not present

## 2021-05-19 LAB — BASIC METABOLIC PANEL
Anion gap: 10 (ref 5–15)
BUN: 33 mg/dL — ABNORMAL HIGH (ref 8–23)
CO2: 30 mmol/L (ref 22–32)
Calcium: 8.2 mg/dL — ABNORMAL LOW (ref 8.9–10.3)
Chloride: 90 mmol/L — ABNORMAL LOW (ref 98–111)
Creatinine, Ser: 1.52 mg/dL — ABNORMAL HIGH (ref 0.61–1.24)
GFR, Estimated: 49 mL/min — ABNORMAL LOW (ref >60)
Glucose, Bld: 182 mg/dL — ABNORMAL HIGH (ref 70–99)
Potassium: 3.8 mmol/L (ref 3.5–5.1)
Sodium: 130 mmol/L — ABNORMAL LOW (ref 135–145)

## 2021-05-19 LAB — GLUCOSE, CAPILLARY
Glucose-Capillary: 156 mg/dL — ABNORMAL HIGH (ref 70–99)
Glucose-Capillary: 174 mg/dL — ABNORMAL HIGH (ref 70–99)
Glucose-Capillary: 182 mg/dL — ABNORMAL HIGH (ref 70–99)
Glucose-Capillary: 204 mg/dL — ABNORMAL HIGH (ref 70–99)
Glucose-Capillary: 161 mg/dL — ABNORMAL HIGH (ref 70–99)

## 2021-05-19 LAB — CBC
HCT: 25.5 % — ABNORMAL LOW (ref 39.0–52.0)
Hemoglobin: 8.4 g/dL — ABNORMAL LOW (ref 13.0–17.0)
MCH: 29.3 pg (ref 26.0–34.0)
MCHC: 32.9 g/dL (ref 30.0–36.0)
MCV: 88.9 fL (ref 80.0–100.0)
Platelets: 307 10*3/uL (ref 150–400)
RBC: 2.87 MIL/uL — ABNORMAL LOW (ref 4.22–5.81)
RDW: 13.8 % (ref 11.5–15.5)
WBC: 18 10*3/uL — ABNORMAL HIGH (ref 4.0–10.5)
nRBC: 0 % (ref 0.0–0.2)

## 2021-05-19 LAB — COOXEMETRY PANEL
Carboxyhemoglobin: 1.3 % (ref 0.5–1.5)
Methemoglobin: 0.9 % (ref 0.0–1.5)
O2 Saturation: 56 %
Total hemoglobin: 8.7 g/dL — ABNORMAL LOW (ref 12.0–16.0)

## 2021-05-19 LAB — SEDIMENTATION RATE: Sed Rate: 55 mm/hr — ABNORMAL HIGH (ref 0–16)

## 2021-05-19 MED ORDER — VITAL 1.5 CAL PO LIQD
1000.0000 mL | ORAL | Status: DC
  Administered 2021-05-20 – 2021-06-16 (×32): 1000 mL

## 2021-05-19 MED ORDER — ACETAMINOPHEN 160 MG/5ML PO SOLN
650.0000 mg | ORAL | Status: DC | PRN
  Administered 2021-05-19 – 2021-05-31 (×9): 650 mg
  Filled 2021-05-19 (×9): qty 20.3

## 2021-05-19 MED ORDER — GUAIFENESIN 100 MG/5ML PO LIQD
30.0000 mL | Freq: Four times a day (QID) | ORAL | Status: DC
  Administered 2021-05-19 – 2021-06-08 (×82): 30 mL
  Filled 2021-05-19 (×88): qty 30

## 2021-05-19 MED ORDER — ROSUVASTATIN CALCIUM 5 MG PO TABS
10.0000 mg | ORAL_TABLET | Freq: Every day | ORAL | Status: DC
  Administered 2021-05-19 – 2021-06-16 (×29): 10 mg
  Filled 2021-05-19 (×30): qty 2

## 2021-05-19 MED ORDER — HYDROXYZINE HCL 50 MG/ML IM SOLN
25.0000 mg | Freq: Four times a day (QID) | INTRAMUSCULAR | Status: DC | PRN
  Administered 2021-05-19: 25 mg via INTRAMUSCULAR
  Filled 2021-05-19 (×2): qty 0.5

## 2021-05-19 MED ORDER — FUROSEMIDE 10 MG/ML IJ SOLN
60.0000 mg | Freq: Two times a day (BID) | INTRAMUSCULAR | Status: AC
  Administered 2021-05-19 (×2): 60 mg via INTRAVENOUS
  Filled 2021-05-19 (×2): qty 6

## 2021-05-19 MED ORDER — TRAMADOL HCL 50 MG PO TABS
50.0000 mg | ORAL_TABLET | ORAL | Status: DC | PRN
  Administered 2021-05-19 (×2): 50 mg
  Administered 2021-05-20 – 2021-05-21 (×2): 100 mg
  Filled 2021-05-19 (×2): qty 1
  Filled 2021-05-19 (×2): qty 2

## 2021-05-19 MED ORDER — ASPIRIN 81 MG PO CHEW
81.0000 mg | CHEWABLE_TABLET | Freq: Every day | ORAL | Status: DC
  Administered 2021-05-19 – 2021-06-16 (×29): 81 mg
  Filled 2021-05-19 (×30): qty 1

## 2021-05-19 MED ORDER — ACETAMINOPHEN 325 MG PO TABS: 650.0000 mg | ORAL_TABLET | ORAL | Status: DC | PRN

## 2021-05-19 MED ORDER — COLCHICINE 0.6 MG PO TABS
0.6000 mg | ORAL_TABLET | Freq: Every day | ORAL | Status: DC
  Administered 2021-05-19 – 2021-06-16 (×29): 0.6 mg
  Filled 2021-05-19 (×30): qty 1

## 2021-05-19 MED ORDER — POTASSIUM CHLORIDE 20 MEQ PO PACK
40.0000 meq | PACK | Freq: Once | ORAL | Status: AC
Start: 2021-05-19 — End: 2021-05-19
  Administered 2021-05-19: 40 meq
  Filled 2021-05-19: qty 2

## 2021-05-19 MED ORDER — VANCOMYCIN HCL 1500 MG/300ML IV SOLN
1500.0000 mg | Freq: Once | INTRAVENOUS | Status: AC
  Administered 2021-05-19: 1500 mg via INTRAVENOUS
  Filled 2021-05-19: qty 300

## 2021-05-19 MED ORDER — VANCOMYCIN VARIABLE DOSE PER UNSTABLE RENAL FUNCTION (PHARMACIST DOSING): Status: DC

## 2021-05-19 MED ORDER — GUAIFENESIN 200 MG PO TABS
600.0000 mg | ORAL_TABLET | Freq: Four times a day (QID) | ORAL | Status: DC
  Filled 2021-05-19 (×2): qty 3

## 2021-05-19 MED FILL — Heparin Sod (Porcine)-NaCl IV Soln 1000 Unit/500ML-0.9%: INTRAVENOUS | Qty: 1000 | Status: AC

## 2021-05-19 NOTE — Progress Notes (Signed)
Physical Therapy Treatment Patient Details Name: Randall Hodges MRN: [id number] DOB: 06-Oct-1950 Today's Date: 05/19/2021   History of Present Illness Pt is a 71 y.o. male admitted 05/17/2021 with severe chest pain. S/p emergent CABG x3 1/18. ETT 1/18-1/19. Pt with hypotension and increased oxygen requirement 1/30. 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 "Will need stripping of pericardium once respiratory status improves." PMH includes prostate CA.    PT Comments    Pt with regression towards his physical therapy goals, with increased oxygen requirement and weakness. Pt with bowel incontinence upon entry. Assisted RN with peri care and bed linen change and then transferred pt to [facility]. Pt eventually able to transfer from [facility] to recliner. Requiring moderate assist (+2 safety/equipment) for all aspects. SpO2 86-98% on 15L O2. Anticipate pt may need post acute rehab in setting of medical complexities, extended hospital stay and associated debility/deconditioning. Prior to admission, pt independent and active.     Recommendations for follow up therapy are one component of a multi-disciplinary discharge planning process, led by the attending physician.  Recommendations may be updated based on patient status, additional functional criteria and insurance authorization.  Follow Up Recommendations  Acute inpatient rehab (3hours/day)     Assistance Recommended at Discharge Frequent or constant Supervision/Assistance  Patient can return home with the following Assistance with cooking/housework;Direct supervision/assist for medications management;Assist for transportation;Help with stairs or ramp for entrance;A lot of help with walking and/or transfers;A lot of help with bathing/dressing/bathroom   Equipment Recommendations  BSC/3in1;Wheelchair (measurements PT);Wheelchair cushion (measurements PT)    Recommendations for Other Services       Precautions /  Restrictions Precautions Precautions: Fall;Sternal Precaution Comments: HFNC, cortrak Restrictions Weight Bearing Restrictions: No     Mobility  Bed Mobility Overal bed mobility: Needs Assistance Bed Mobility: Rolling, Sidelying to Sit Rolling: Mod assist, +2 for physical assistance Sidelying to sit: Mod assist, +2 for physical assistance       General bed mobility comments: ModA to roll to R/L for peri care and bed linen change. Pt able to initiate bringing BLE's off edge of bed, use of bed pad to scoot hips, assist at trunk for upright    Transfers Overall transfer level: Needs assistance Equipment used: None, Rolling walker (2 wheels) Transfers: Sit to/from Stand, Bed to chair/wheelchair/BSC Sit to Stand: Mod assist, +2 safety/equipment Stand pivot transfers: Mod assist, +2 safety/equipment         General transfer comment: Cues for "hands on knees," rocking forward to gain momentum, modA to power up    Ambulation/Gait                   Stairs             Wheelchair Mobility    Modified Rankin (Stroke Patients Only)       Balance Overall balance assessment: Needs assistance Sitting-balance support: No upper extremity supported, Feet supported Sitting balance-Leahy Scale: Good     Standing balance support: Single extremity supported, During functional activity Standing balance-Leahy Scale: Poor                              Cognition Arousal/Alertness: Awake/alert Behavior During Therapy: WFL for tasks assessed/performed, Flat affect Overall Cognitive Status: Within Functional Limits for tasks assessed  General Comments: very subdued and not interactive this session        Exercises      General Comments        Pertinent Vitals/Pain Pain Assessment Pain Assessment: Faces Faces Pain Scale: Hurts even more Pain Location: generalized, chest Pain Descriptors / Indicators:  Sore, Discomfort Pain Intervention(s): Limited activity within patient's tolerance, Monitored during session    Home Living                          Prior Function            PT Goals (current goals can now be found in the care plan section) Acute Rehab PT Goals Patient Stated Goal: Return home PT Goal Formulation: With patient Time For Goal Achievement: 05/23/21 Potential to Achieve Goals: Fair Progress towards PT goals: Not progressing toward goals - comment    Frequency    Min 3X/week      PT Plan Discharge plan needs to be updated    Co-evaluation              AM-PAC PT "6 Clicks" Mobility   Outcome Measure  Help needed turning from your back to your side while in a flat bed without using bedrails?: A Lot Help needed moving from lying on your back to sitting on the side of a flat bed without using bedrails?: A Lot Help needed moving to and from a bed to a chair (including a wheelchair)?: A Lot Help needed standing up from a chair using your arms (e.g., wheelchair or bedside chair)?: A Lot Help needed to walk in hospital room?: Total Help needed climbing 3-5 steps with a railing? : Total 6 Click Score: 10    End of Session   Activity Tolerance: Patient limited by fatigue Patient left: in chair;with call bell/phone within reach;with family/visitor present Nurse Communication: Mobility status PT Visit Diagnosis: Other abnormalities of gait and mobility (R26.89);Muscle weakness (generalized) (M62.81)     Time: 3212-2482 PT Time Calculation (min) (ACUTE ONLY): 61 min  Charges:  $Therapeutic Activity: 53-67 mins                     Wyona Almas, PT, DPT Acute Rehabilitation Services Pager [phone] Office [phone]    Deno Etienne 05/19/2021, 1:28 PM

## 2021-05-19 NOTE — Progress Notes (Signed)
NAME:  Randall Hodges, MRN:  [id number], DOB:  [birth year], LOS: 62 ADMISSION DATE:  04/28/2021, CONSULTATION DATE: 04/29/2021 REFERRING MD:  Dahlia Byes, MD, CHIEF COMPLAINT: Increasing shortness of breath  History of Present Illness:  71 year old male with coronary artery disease who initially presented with chest pain, noted to have multivessel coronary artery disease, he underwent CABG x3 on 04/1818 23, course was complicated with atrial fibrillation, frequent hiccups and aspiration pneumonia leading to hypoxia and increasing oxygen requirement. PCCM was consulted for evaluation and help with management  Patient stated hiccups are better but continued complain of shortness of breath, cough unable to bring up phlegm.  Denies chest pain or palpitation During my evaluation patient is on high flow nasal cannula oxygen at 12 L and he is requiring low-dose Levophed to maintain MAP above 65.  Of note patient had echocardiogram repeated today, consistent with constrictive picture.  Pertinent  Medical History   Past Medical History:  Diagnosis Date   Allergic rhinitis    Prostate cancer ([place])      Significant Hospital Events: Including procedures, antibiotic start and stop dates in addition to other pertinent events     Interim History / Subjective:  Left heart cath was done which is consistent with constrictive pericarditis Patient remained on vasopressors but requirement is coming down clinically and to off levo and vasopressin Still remains on 10 L oxygen, able to maintain O2 sat above 95% Continue to complain of dyspnea and chest tightness  Objective   Blood pressure 118/62, pulse 98, temperature 97.6 F (36.4 C), temperature source Oral, resp. rate (!) 24, height 5\' 10"  (1.778 m), weight 86.6 kg, SpO2 98 %. CVP:  [5 mmHg-18 mmHg] 7 mmHg      Intake/Output Summary (Last 24 hours) at 05/19/2021 0758 Last data filed at 05/19/2021 0500 Gross per 24 hour  Intake 1460.86 ml   Output 975 ml  Net 485.86 ml   Filed Weights   05/16/21 0550 05/17/21 0321 05/19/21 0500  Weight: 82.3 kg 81.1 kg 86.6 kg    Examination: Physical exam: General: Acute on chronically ill-appearing male, lying on the bed.  On high flow nasal cannula oxygen HEENT: [place]/AT, eyes anicteric.  moist mucus membranes Neuro: Alert, awake following commands Chest: Bilateral crackles all over, no wheezes or rhonchi Heart: Irregularly irregular, multiple PVCs, no murmurs or gallops.  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 He will need stripping of pericardium once respiratory status improves GI is following for hiccups, continue Valium and Reglan Patient continues to require 10 L of oxygen, now able to maintain O2 sat above 95% X-ray chest looks worse than yesterday, likely to lagging behind clinical picture Will broaden antibiotic to add vancomycin and continue Zosyn Unable to get sputum culture White count is started trending down now it is 18 Continue IV Levophed and vasopressin with map goal 65 Patient with multiple PVCs Monitor and supplement electrolytes Serum creatinine continue to improve with gentle hydration now is down to 1.5 Monitor intake and output Avoid  nephrotoxic agent Continue to closely monitor electrolytes   Best Practice (right click and "Reselect all SmartList Selections" daily)   Diet/type: NPO  tube feeds DVT prophylaxis: Lovenox GI prophylaxis: PPI Lines: N/A Foley:  N/A Code Status:  full code Last date of multidisciplinary goals of care discussion [Per primary team]  Labs   CBC: Recent Labs  Lab 05/13/21 0056 05/14/21 0148 05/15/21 0117 05/17/21 1619 05/17/21 1818 04/29/2021 0314 04/28/2021 1629 05/19/21 0209  WBC 14.5* 12.4* 13.0*  --   --  19.4*  --  18.0*  HGB 9.6* 9.6* 9.6* 9.9* 12.6* 8.7* 9.5*   9.9* 8.4*  HCT 27.8* 28.7* 27.9* 29.0* 37.0* 26.1* 28.0*   29.0* 25.5*  MCV 89.1 90.8 88.0  --   --  89.4  --  88.9  PLT 153 174 218  --   --  301  --  510    Basic Metabolic Panel: Recent Labs  Lab 05/15/21 0117 05/17/21 0413 05/17/21 1619 05/17/21 1733 05/17/21 1818 04/19/2021 0314 05/16/2021 1629 05/19/21 0209  NA 128* 128*   < > 127* 127* 127* 131*   130* 130*  K 3.8 3.4*   < > 3.5 3.3* 3.6 3.6   3.7 3.8  CL 86* 82*  --  85*  --  84*  --  90*  CO2 33* 31  --  32  --  30  --  30  GLUCOSE 135* 124*  --  126*  --  206*  --  182*  BUN 18 26*  --  34*  --  36*  --  33*  CREATININE 1.16 1.79*  --  2.46*  --  2.11*  --  1.52*  CALCIUM 8.2* 8.1*  --  7.6*  --  7.9*  --  8.2*  MG 1.8  --   --  1.9  --   --   --   --    < > = values in this interval not displayed.   GFR: Estimated Creatinine Clearance: 46 mL/min (A) (by C-G formula based on SCr of 1.52 mg/dL (H)). Recent Labs  Lab 05/14/21 0148 05/15/21 0117 04/23/2021 0314 05/17/2021 0910 05/19/21 0209  PROCALCITON  --   --   --  0.33  --   WBC 12.4* 13.0* 19.4*  --  18.0*    Liver Function Tests: No results for input(s): AST, ALT, ALKPHOS, BILITOT, PROT, ALBUMIN in the last 168 hours. No results for input(s): LIPASE, AMYLASE in the last 168 hours. No results for input(s): AMMONIA in the last 168 hours.  ABG    Component Value Date/Time   PHART 7.514 (H) 05/17/2021 1818   PCO2ART 42.4 05/17/2021 1818   PO2ART 59 (L) 05/17/2021 1818   HCO3 32.0 (H) 04/21/2021 1629   HCO3 31.0 (H) 04/23/2021 1629    TCO2 33 (H) 04/28/2021 1629   TCO2 32 05/04/2021 1629   ACIDBASEDEF 3.0 (H) 05/08/2021 0323   O2SAT 56.0 05/19/2021 0607     Coagulation Profile: No results for input(s): INR, PROTIME in the last 168 hours.  Cardiac Enzymes: No results for input(s): CKTOTAL, CKMB, CKMBINDEX, TROPONINI in the last 168 hours.  HbA1C: Hgb A1c MFr Bld  Date/Time Value Ref Range Status  05/09/2021 09:26 AM 6.1 (H) 4.8 - 5.6 % Final    Comment:    (NOTE)         Prediabetes: 5.7 - 6.4         Diabetes: >6.4  Glycemic control for adults with diabetes: <7.0     CBG: Recent Labs  Lab 05/17/21 0958 05/16/2021 0851 04/28/2021 1118 05/15/2021 1825 04/19/2021 2019  GLUCAP 128* 117* 127* 141* 114*    Critical care time:     Total critical care time: 39 minutes  Performed by: [place] care time was exclusive of separately billable procedures and treating other patients.   Critical care was necessary to treat or prevent imminent or life-threatening deterioration.   Critical care was time spent personally by me on the following activities: development of treatment plan with patient and/or surrogate as well as nursing, discussions with consultants, evaluation of patient's response to treatment, examination of patient, obtaining history from patient or surrogate, ordering and performing treatments and interventions, ordering and review of laboratory studies, ordering and review of radiographic studies, pulse oximetry and re-evaluation of patient's condition.   Jacky Kindle MD [facility] See Amion for pager If no response to pager, please call [phone] until 7pm After 7pm, Please call E-link [phone]

## 2021-05-19 NOTE — Progress Notes (Addendum)
[address]       ,[place] [address]             [phone]     13 Days Post-op CABG x 3   1 Day Post-Op Procedure(s) (LRB): RIGHT/LEFT HEART CATH AND CORONARY ANGIOGRAPHY (N/A) Subjective:   Resting in bed, awakens easily. Says he is comfortable now.   Levophed at 79mcg/min Vasopressin at 0.34mcg  CoOx 56  O2 requirement stable at 10L    Objective: Vital signs in last 24 hours: Temp:  [97.8 F (36.6 C)-98.4 F (36.9 C)] 98.4 F (36.9 C) (01/31 0315) Pulse Rate:  [0-295] 98 (01/31 0715) Cardiac Rhythm: Atrial fibrillation (01/31 0400) Resp:  [8-140] 24 (01/31 0715) BP: (75-142)/(47-79) 118/62 (01/30 1654) SpO2:  [0 %-100 %] 98 % (01/31 0715) Arterial Line BP: (107-169)/(41-68) 118/54 (01/31 0715) Weight:  [86.6 kg] 86.6 kg (01/31 0500) CVP:  [5 mmHg-18 mmHg] 7 mmHg  Intake/Output from previous day: 01/30 0701 - 01/31 0700 In: 1460.9 [I.V.:748.3; NG/GT:221.7; IV Piggyback:490.9] Out: 975 [Urine:975] Intake/Output this shift: No intake/output data recorded.  General appearance: alert, cooperative, and mild distress.  No increased WOB now, cough is only minimally productive  Neurologic: intact Heart: A-fib with better rate control today. Frequent PVC's Lungs: breath sounds are coarse, few exp wheezes. O2 sats acceptable on 10L/[place] O2.  CXR showing worsening diffuse bilateral opacities. Abdomen: soft, non-tender.  Extremities: 1-2+ LE edema. The RLE EVH incision is intact and dry.  Wound: the sternotomy incision remains well approximated and dry.   Lab Results: Recent Labs    05/14/2021 0314 04/28/2021 1629 05/19/21 0209  WBC 19.4*  --  18.0*  HGB 8.7* 9.5*   9.9* 8.4*  HCT 26.1* 28.0*   29.0* 25.5*  PLT 301  --  307    BMET:  Recent Labs    05/19/2021 0314 05/10/2021 1629 05/19/21 0209  NA 127* 131*   130* 130*  K 3.6 3.6   3.7 3.8  CL 84*  --  90*  CO2 30  --  30  GLUCOSE 206*  --  182*  BUN 36*  --  33*  CREATININE 2.11*  --   1.52*  CALCIUM 7.9*  --  8.2*     PT/INR: No results for input(s): LABPROT, INR in the last 72 hours. ABG    Component Value Date/Time   PHART 7.514 (H) 05/17/2021 1818   HCO3 32.0 (H) 05/04/2021 1629   HCO3 31.0 (H) 04/25/2021 1629   TCO2 33 (H) 04/29/2021 1629   TCO2 32 04/30/2021 1629   ACIDBASEDEF 3.0 (H) 05/08/2021 0323   O2SAT 56.0 05/19/2021 0607   CBG (last 3)  Recent Labs    04/19/2021 1118 05/01/2021 1825 05/04/2021 2019  GLUCAP 127* 141* 114*    RIGHT/LEFT HEART CATH AND CORONARY ANGIOGRAPHY   Conclusion      Right Heart: Simultaneous RV/LV tracings were obtained. Difficult to interpret due to atrial fibrillation. There was some suggestion of discordance (ventricular interdependence) but not clear.   1. RA pressure increased, PCWP normal range.  2. Equalization of diastolic pressures (RA, PCWP, LVEDP, RVEDP).  3. Preserved cardiac output.  4. Simultaneous RV/LV tracings were obtained.  Difficult to interpret due to atrial fibrillation.  There was some suggestion of discordance (ventricular interdependence) but not clear.        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  Simultaneous RV/LV tracings are not definitive of ventricular interdependence, however.  Assessment/Plan: S/P Procedure(s) (LRB): RIGHT/LEFT HEART CATH AND CORONARY ANGIOGRAPHY (N/A)  -POD-13 CABG x 3 presenting with acute STEMI . EF 45% with global hypokinesis on echo 1/25, small pericardial effusion. Right heart cath yesterday was suggestive of constrictive pericarditis. On colchicine, avoiding NSAIDs due to marginal renal function.   -PULM- Suspected aspiration pneumonia with worsening bilateral infiltrates n CXR. Maxipime changed to Zosyn for better anaerobe and Gm neg coverage.   Sputum Cx ordered but apparently has not produced an adequate specimen.   -Paroxysmal a-fib / a-flutter- on amiodarone infusion.  Rate control better today.   Eliquis discontinued, now on daily enoxaparin.   -GI- Had persistent hiccups for several days after surgery, better past 24 hours. GI following. On Reglan, Protonix and PRN IV Valium.  Currently NPO, TF started via [employer]Trak.   -Acute renal insufficiency- Creat trending down, UO adequate. Monitor.     LOS: 14 days    Randall Hodges, [place] [phone] 05/19/2021  Slowly advance TF Pm pO2 up tp 77 Plan of care d/w patient and family. Patient's wife understandably very distraught over the discomfort prolonged hiccups have been causing.

## 2021-05-19 NOTE — Progress Notes (Addendum)
°   05/19/21 1000  SLP Visit Information  SLP Received On 05/19/21  Subjective  Subjective alert  General Information  HPI 71 year old male with coronary artery disease who initially presented with chest pain, noted to have multivessel coronary artery disease, he underwent CABG x3 on 1/18/2 23, course was complicated with atrial fibrillation, frequent hiccups and aspiration pneumonia leading to hypoxia and increasing oxygen requirement. ETT 1/18-1/18. 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 Cortrak 1/30.  Type of Study Bedside Swallow Evaluation  Previous Swallow Assessment no  Diet Prior to this Study NPO;NG Tube  Temperature Spikes Noted No  Respiratory Status Nasal cannula (15l HFNC)  Behavior/Cognition Alert;Cooperative;Pleasant mood  Oral Cavity Assessment WFL  Oral Care Completed by SLP Recent completion by staff  Oral Cavity - Dentition Adequate natural dentition  Vision Functional for self-feeding  Self-Feeding Abilities Needs assist  Patient Positioning Upright in bed  Baseline Vocal Quality Normal  Volitional Cough Weak  Volitional Swallow Able to elicit  Oral Motor/Sensory Function  Overall Oral Motor/Sensory Function WFL  Ice Chips  Ice chips Impaired  Pharyngeal Phase Impairments Multiple swallows;Cough - Immediate  Thin Liquid  Thin Liquid Impaired  Presentation Spoon;Straw  Pharyngeal  Phase Impairments Multiple swallows;Cough - Immediate  Nectar Thick Liquid  Nectar Thick Liquid NT  Honey Thick Liquid  Honey Thick Liquid NT  Puree  Puree Impaired  Presentation Spoon  Pharyngeal Phase Impairments Cough - Delayed  Solid  Solid NT  SLP - End of Session  Patient left in bed;with call bell/phone within reach;with nursing in room  Nurse Communication Plan for instrumental testing  SLP Assessment  Clinical Impression Statement (ACUTE ONLY) Pt participated in clinical swallowing assessment with concerns for dysphagia. Currently on 15L HFNC; RR ranged  from mid 20s to low 30s.  He demonstrated potential s/sx of dysphagia, but frequent coughing was evident throughout the assessment, making it difficult to discern association with PO intake. Pt is sensitive to cold temp food/liquid and he expressed worry it would trigger hiccups again.  Provided with room temperature water and applesauce, all of which led to intermittent cough and multiple sub-swallows, as well as assertion of material "sticking" in throat.  Given complexity of his situation, recommend proceeding with FEES at bedside next date to evaluate physiology of swallow. If he is less tenuous tomorrow and can transport off the unit to radiology, an MBS would be more informative. D/W Dr. Tacy Learn, RN, and pt, who agree with plan. Continue NPO.  SLP Visit Diagnosis Dysphagia, unspecified (R13.10)  Other Related Risk Factors Deconditioning;Decreased respiratory status  Swallow Evaluation Recommendations  SLP Diet Recommendations NPO  Treatment Plan  Oral Care Recommendations Oral care QID  Treatment Recommendations Defer until completion of intrumental exam  Follow Up Recommendations Other (comment) (tba)  Individuals Consulted  Consulted and Agree with Results and Recommendations Patient  SLP Time Calculation  SLP Start Time (ACUTE ONLY) 1018  SLP Stop Time (ACUTE ONLY) 1046  SLP Time Calculation (min) (ACUTE ONLY) 28 min  SLP Evaluations  $ SLP Speech Visit 1 Visit  SLP Evaluations  $BSS Swallow 1 Procedure   Braelyn Jenson L. Tivis Ringer, [place] Office number [phone] Pager [phone]

## 2021-05-19 NOTE — Progress Notes (Signed)
Pharmacy Antibiotic Note  Randall Hodges is a 71 y.o. male admitted on 04/26/2021 with pneumonia.  Pharmacy has been consulted for Zosyn and vancomycin dosing. WBC is trending up, CXR suggestive of bilateral multifocal pulmonary infiltrates, and requiring low dose levophed to maintain MAPs. Patient noted to have dark colored urine on 09/08/20 with use of amox/clav.  Plan: Vancomycin 1500 mg IV x1, then vancomycin variable dosing If Scr does not change, consider vancomycin 1500 q24hr (Scr 1.52, AUC 483.9) Continue Zosyn 3.375g IV q8h (4 hour infusion). Continue to monitor for renal function, s/sx of clinical improvement, and culture results to narrow abx. Continue to monitor for side effects.  Height: 5\' 10"  (177.8 cm) Weight: 86.6 kg (190 lb 14.7 oz) IBW/kg (Calculated) : 73  Temp (24hrs), Avg:98 F (36.7 C), Min:97.6 F (36.4 C), Max:98.4 F (36.9 C)  Recent Labs  Lab 05/13/21 0056 05/14/21 0148 05/15/21 0117 05/17/21 0413 05/17/21 1733 05/10/2021 0314 05/19/21 0209  WBC 14.5* 12.4* 13.0*  --   --  19.4* 18.0*  CREATININE 1.07 1.04 1.16 1.79* 2.46* 2.11* 1.52*     Estimated Creatinine Clearance: 46 mL/min (A) (by C-G formula based on SCr of 1.52 mg/dL (H)).    Allergies  Allergen Reactions   Amoxicillin-Pot Clavulanate     Per pt report on 08/19/20, makes his urine dark colored   Atorvastatin Other (See Comments)     ( pt states causing runny nose, headaches, issue w/ urination)   Plant Derived Enzymes     Other reaction(s): Unknown   Trichophyton Other (See Comments)    Antimicrobials this admission: Cefepime 1/29 >> 1/30 Zosyn 1/30 >>  Vancomycin 1/31 >>   Dose adjustments this admission: None  Microbiology results: 1/30 Sputum: sent  1/17 MRSA PCR: negative   Thank you for allowing pharmacy to be a part of this patients care.  Varney Daily, PharmD PGY1 Pharmacy Resident  Please check AMION for all [facility] pharmacy phone numbers After 10:00 PM call main  pharmacy [phone]

## 2021-05-19 NOTE — Progress Notes (Signed)
ABG obtained using I-Stat this AM and this afternoon. I-Stat docked each time but results not flowing over to Epic. AM ABG results WNL and shown to Dr. Tacy Learn - no orders received based on results.  PM ABG results from 1638: pH 7.46 pCO2 46.9 pO2 71 BE 9 HCO3 33.7 TCO2 35 sO2% 95

## 2021-05-19 NOTE — Progress Notes (Signed)
Patient ID: ZADKIEL DRAGAN, male   DOB: [birth year], 71 y.o.   MRN: [id number]    Progress Note  Patient Name: Randall Hodges Date of Encounter: 05/19/2021  [facility] Cardiologist: [name], MD   Subjective   Hiccupping improved on valium.  Patient remains NPO pending swallow evaluation and has Cortrack with tube feeds.   CXR continues to worsen, extensive bilateral lung infiltrates. However, patient is clinically stable on 10 L HFNC, short of breath with movement but ok at rest.  He is on Zosyn in setting of aspiration PNA. WBCs 18, afebrile.   MAP is stable this morning on NE 2, vasopressin 0.03.   He remains in atypical atrial flutter on amiodarone gtt, heparin gtt held with organizing pericardial effusion. He is on Lovenox.   Echo (limited, 1/30): Echo reviewed, there is Hodges respirophasic variation of the interventricular septum and marked respirophasic variation of E inflow velocity on doppler evaluation of the mitral valve.  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  LV EF 45-50%.   RHC Procedural Findings (on norepinephrine 6): Hemodynamics (mmHg) RA mean 12 RV 37/12 PA 38/16, mean 27 PCWP mean 11 LV 108/12 AO 96/55 PAPI 1.8 Oxygen saturations: PA 54% AO 94% Cardiac Output (Fick) 5.59  Cardiac Index (Fick) 2.81  PVR 2.8 WU Simultaneous RV/LV tracings were obtained.  Difficult to interpret due to atrial fibrillation.  There was some suggestion of discordance (ventricular interdependence) but not Hodges.  Inpatient Medications    Scheduled Meds:  aspirin EC  81 mg Oral Daily   chlorhexidine  15 mL Mouth Rinse BID   Chlorhexidine Gluconate Cloth  6 each Topical Daily   colchicine  0.6 mg Oral Daily   diazepam  1.25 mg Intravenous QHS   enoxaparin (LOVENOX) injection  40  mg Subcutaneous Q24H   furosemide  60 mg Intravenous BID   guaiFENesin  1,200 mg Oral BID   insulin aspart  0-15 Units Subcutaneous Q4H   mouth rinse  15 mL Mouth Rinse q12n4p   metoCLOPramide (REGLAN) injection  10 mg Intravenous Q6H   pantoprazole (PROTONIX) IV  40 mg Intravenous Daily   potassium chloride  40 mEq Per Tube Once   rosuvastatin  10 mg Oral Daily   sodium chloride flush  10-40 mL Intracatheter Q12H   sodium chloride flush  10-40 mL Intracatheter Q12H   sodium chloride flush  3 mL Intravenous Q12H   sodium chloride flush  3 mL Intravenous Q12H   Continuous Infusions:  sodium chloride Stopped (05/15/2021 1034)   sodium chloride     sodium chloride     amiodarone 30 mg/hr (05/19/21 0500)   dextrose 5 % and 0.9 % NaCl with KCl 20 mEq/L Stopped (05/11/2021 1304)   feeding supplement (VITAL 1.5 CAL) 1,000 mL (05/13/2021 1755)   norepinephrine (LEVOPHED) Adult infusion 6 mcg/min (05/19/21 0500)   piperacillin-tazobactam (ZOSYN)  IV 12.5 mL/hr at 05/19/21 0500   vasopressin 0.03 Units/min (05/19/21 0500)   PRN Meds: Place/Maintain arterial line **AND** sodium chloride, sodium chloride, acetaminophen, diazepam, metoprolol tartrate, morphine injection, ondansetron (ZOFRAN) IV, sodium chloride flush, sodium chloride flush, sodium chloride flush, sodium chloride flush, traMADol   Vital Signs    Vitals:   05/19/21 0630 05/19/21 0645 05/19/21 0700 05/19/21 0715  BP:      Pulse: 98 97 100 98  Resp: (!) 27 (!) 27 (!)  21 (!) 24  Temp:      TempSrc:      SpO2: 94% 95% 95% 98%  Weight:      Height:        Intake/Output Summary (Last 24 hours) at 05/19/2021 0747 Last data filed at 05/19/2021 0500 Gross per 24 hour  Intake 1460.86 ml  Output 975 ml  Net 485.86 ml   Last 3 Weights 05/19/2021 05/17/2021 05/16/2021  Weight (lbs) 190 lb 14.7 oz 178 lb 12.7 oz 181 lb 8 oz  Weight (kg) 86.6 kg 81.1 kg 82.328 kg      Telemetry    Atypical flutter rate 90s - Personally  Reviewed  Physical Exam   General: NAD Neck: JVP 10-12 cm, no thyromegaly or thyroid nodule.  Lungs: Bilateral crackles diffusely CV: Nondisplaced PMI.  Heart irregular S1/S2, no S3/S4, no murmur. 1+ ankle edema.   Abdomen: Soft, nontender, no hepatosplenomegaly, no distention.  Skin: Intact without lesions or rashes.  Neurologic: Alert and oriented x 3.  Psych: Normal affect. Extremities: No clubbing or cyanosis.  HEENT: Normal.   Labs    High Sensitivity Troponin:   Recent Labs  Lab 05/01/2021 1321 04/24/2021 1518  TROPONINIHS 3 4     Chemistry Recent Labs  Lab 05/15/21 0117 05/17/21 0413 05/17/21 1733 05/17/21 1818 05/15/2021 0314 04/27/2021 1629 05/19/21 0209  NA 128*   < > 127*   < > 127* 131*   130* 130*  K 3.8   < > 3.5   < > 3.6 3.6   3.7 3.8  CL 86*   < > 85*  --  84*  --  90*  CO2 33*   < > 32  --  30  --  30  GLUCOSE 135*   < > 126*  --  206*  --  182*  BUN 18   < > 34*  --  36*  --  33*  CREATININE 1.16   < > 2.46*  --  2.11*  --  1.52*  CALCIUM 8.2*   < > 7.6*  --  7.9*  --  8.2*  MG 1.8  --  1.9  --   --   --   --   GFRNONAA >60   < > 27*  --  33*  --  49*  ANIONGAP 9   < > 10  --  13  --  10   < > = values in this interval not displayed.    Lipids  Recent Labs  Lab 05/13/21 0056  CHOL 71  TRIG 56  HDL 25*  LDLCALC 35  CHOLHDL 2.8    Hematology Recent Labs  Lab 05/15/21 0117 05/17/21 1619 05/19/2021 0314 05/01/2021 1629 05/19/21 0209  WBC 13.0*  --  19.4*  --  18.0*  RBC 3.17*  --  2.92*  --  2.87*  HGB 9.6*   < > 8.7* 9.5*   9.9* 8.4*  HCT 27.9*   < > 26.1* 28.0*   29.0* 25.5*  MCV 88.0  --  89.4  --  88.9  MCH 30.3  --  29.8  --  29.3  MCHC 34.4  --  33.3  --  32.9  RDW 13.5  --  13.9  --  13.8  PLT 218  --  301  --  307   < > = values in this interval not displayed.   Thyroid No results for input(s): TSH, FREET4 in the last 168 hours.  BNPNo results  for input(s): BNP, PROBNP in the last 168 hours.  DDimer No results for input(s):  DDIMER in the last 168 hours.   Radiology    CARDIAC CATHETERIZATION  Result Date: 05/19/2021   Right Heart: Simultaneous RV/LV tracings were obtained. Difficult to interpret due to atrial fibrillation. There was some suggestion of discordance (ventricular interdependence) but not Hodges. 1. RA pressure increased, PCWP normal range. 2. Equalization of diastolic pressures (RA, PCWP, LVEDP, RVEDP). 3. Preserved cardiac output. 4. Simultaneous RV/LV tracings were obtained.  Difficult to interpret due to atrial fibrillation.  There was some suggestion of discordance (ventricular interdependence) but not Hodges.  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  Simultaneous RV/LV tracings are not definitive of ventricular interdependence, however.   DG Chest Port 1 View  Result Date: 05/03/2021 CLINICAL DATA:  Shortness of breath. EXAM: PORTABLE CHEST 1 VIEW COMPARISON:  Chest XR, most recently 05/17/2020. CT chest, 06/03/2017. FINDINGS: Support lines: Interval placement of RIGHT upper extremity PICC catheter tip at the superior cavoatrial junction. Cardiomediastinal silhouette is enlarged. Hypoinflation with increasing conspicuity of bilateral pulmonary opacities. Small volume pleural effusions. No pneumothorax. Known RIGHT rib deformities. No interval osseous abnormality. IMPRESSION: 1. New, well-positioned RIGHT upper extremity PICC with tip at the superior cavoatrial junction 2. Increased conspicuity of bilateral pulmonary opacities. Findings favored to represent multifocal pneumonia with superimposed pulmonary edema. Electronically Signed   By: Michaelle Birks M.D.   On: 04/23/2021 06:19   DG CHEST PORT 1 VIEW  Result Date: 05/17/2021 CLINICAL DATA:  Follow-up exam.  Pleural effusion. EXAM: PORTABLE CHEST 1 VIEW COMPARISON:  05/14/2021 and older studies. FINDINGS: Stable changes from prior CABG surgery. Cardiac silhouette mildly enlarged. Bilateral interstitial and  airspace lung opacities are noted, with a possible mild increase in airspace opacity in the right mid lung, otherwise unchanged. Possible small effusions.  No pneumothorax. No change in posterior right first and second rib fractures IMPRESSION: 1. Mild increase in airspace lung opacity in the right mid lung compared to the most recent prior study. 2. No other change. Remaining airspace lung opacities are stable consistent with multifocal pneumonia. Electronically Signed   By: Lajean Manes M.D.   On: 05/17/2021 10:53   DG Abd Portable 1V  Result Date: 05/17/2021 CLINICAL DATA:  Feeding tube placement. EXAM: PORTABLE ABDOMEN - 1 VIEW COMPARISON:  Abdominal x-ray dated June 23, 2017. FINDINGS: Feeding tube tip near the ligament of Treitz. Visualized bowel gas pattern is normal. Diffuse bilateral pulmonary airspace disease is unchanged. IMPRESSION: 1. Feeding tube tip near the ligament of Treitz. Electronically Signed   By: Titus Dubin M.D.   On: 04/22/2021 14:22   ECHOCARDIOGRAM LIMITED  Result Date: 05/04/2021    ECHOCARDIOGRAM LIMITED REPORT   Patient Name:   Randall Hodges Date of Exam: 04/29/2021 Medical Rec #:  [id number]       Height:       70.0 in Accession #:    [id number]      Weight:       178.8 lb Date of Birth:  28-Mar-1951       BSA:          1.990 m Patient Age:    37 years        BP:           115/64 mmHg Patient Gender: M               HR:           120  bpm. Exam Location:  Inpatient Procedure: Limited Echo and Cardiac Doppler Indications:    Pericardial effusion  History:        Patient has prior history of Echocardiogram examinations, most                 recent 05/13/2021.  Sonographer:    Arlyss Gandy Referring Phys: [place] Comments: Patient supine / sitting IMPRESSIONS  1. Limited study for pericardial effusion follow up.  2. A small pericardial effusion is present. The pericardial effusion is circumferential. The right ventricle does not open well but evidence  of collapse suggesting increased intracardia pressure. There is a drop in mitral inflow variation that is greater that 25% consistent with tamponande physiology. Septal movement consistent with ventricular interdependence. Tissue doppler not performed to assess for annulus reversus. IVC was not visualized.  3. Left ventricular ejection fraction, by estimation, is 45 to 50%. The left ventricle has mildly decreased function. The left ventricle has no regional wall motion abnormalities. Left ventricular diastolic parameters are indeterminate.  4. The right ventricle does not open well suggesting increase intracardiac pressure. . Right ventricular systolic function is normal. The right ventricular size is normal.  5. The mitral valve was not assessed.  6. The aortic valve is tricuspid.  7. The inferior vena cava is normal in size with greater than 50% respiratory variability, suggesting right atrial pressure of 3 mmHg. FINDINGS  Left Ventricle: Left ventricular ejection fraction, by estimation, is 45 to 50%. The left ventricle has mildly decreased function. The left ventricle has no regional wall motion abnormalities. The left ventricular internal cavity size was normal in size. There is no left ventricular hypertrophy. Left ventricular diastolic parameters are indeterminate. Right Ventricle: The right ventricle does not open well suggesting increase intracardiac pressure. The right ventricular size is normal. No increase in right ventricular wall thickness. Right ventricular systolic function is normal. Left Atrium: Left atrial size was normal in size. Right Atrium: Right atrial size was normal in size. Pericardium: A small pericardial effusion is present. The pericardial effusion is circumferential. There is excessive respiratory variation in septal movement. Mitral Valve: The mitral valve was not assessed. Tricuspid Valve: The tricuspid valve is normal in structure. Tricuspid valve regurgitation is not demonstrated.  No evidence of tricuspid stenosis. Aortic Valve: The aortic valve is tricuspid. Pulmonic Valve: The pulmonic valve was normal in structure. Pulmonic valve regurgitation is not visualized. No evidence of pulmonic stenosis. Aorta: Aortic root could not be assessed. Venous: The inferior vena cava is normal in size with greater than 50% respiratory variability, suggesting right atrial pressure of 3 mmHg. IAS/Shunts: No atrial level shunt detected by color flow Doppler. LEFT VENTRICLE PLAX 2D LVIDd:         3.70 cm LVIDs:         2.90 cm LV PW:         1.00 cm LV IVS:        1.00 cm  MITRAL VALVE MV Area (PHT): 5.27 cm MV Decel Time: 144 msec MV E velocity: 83.10 cm/s MV A velocity: 77.70 cm/s MV E/A ratio:  1.07 Kardie Tobb DO Electronically signed by Berniece Salines DO Signature Date/Time: 05/02/2021/11:20:15 AM    Final    [place] EKG SITE RITE  Result Date: 05/17/2021 If Site Rite image not attached, placement could not be confirmed due to current cardiac rhythm.   Cardiac Studies   Cath 05/03/2021 Distal left main [place] 111 bifurcation stenosis with 75% left  main, 90% ostial to proximal LAD, and 80-90% ostial circumflex (difficult to assess due to heavy calcification). Severe mid circumflex disease with 70% eccentric mid stenosis and second obtuse marginal containing ostial to proximal greater than 80% stenosis.  (Bifurcation [place] 111 Severe calcification in left main and LAD in particular with diffuse 50% narrowing from proximal to mid vessel and tandem 70% stenoses in the mid LAD. Nondominant right coronary Normal LV function.  EF 55%.  LVEDP normal.    Patient Profile     71 y.o. male with PMH of PVCs presented with chest pain. Cardiac cath by Dr. Tamala Julian on 05/09/2021 showed 75% left main, 90% ost to prox LAD, 80-90% ost LCx, 70% mid LCx, 80% OM2, 50% prox to mid LAD, 70% mid LAD, EF 55%. Patient underwent CABG x 3 on 05/01/2021. Post op course complicated afib, treated with amio. CXR showed opacity in  bilateral lung, started abx on 1/25 and diuretic. Started on Eliquis due to recurrence of afib.    Assessment & Plan    1. CAD: Admitted with unstable angina, cath with severe left main and proximal LAD/LCx disease (nondominant RCA).  CABG x 3 on 1/18 with LIMA-LAD, SVG-OM, SVG-left PDA.   - Continue ASA 81, statin.  2. 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 RHC 1/30 with equalization of diastolic pressures.  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  He has mild pleuritic chest pain.   Deveoped hypotension/AKI after aggressive diuresis, BP-active meds and Lasix held.  Now with creatinine improved to 1.52 with CVP 13, co-ox 56%. - With concern for development of post-surgical effusive/constrictive pericarditis, he is on colchicine. Send ESR.  - With worsening CXR, will try to gently diurese => Lasix 60 mg IV bid.  - ?need for surgical intervention on effusive/constrictive pericarditis. Will need improvement of lung disease first.  3. 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  Probably primarily septic/vasodilatory shock.   - NE decreased to 2, also on vasopressin 0.03. Titrate down as able.  4. Hiccups: Intractable, this has been his main complaint. GI following, started on gabapentin and valium with some improvement.   - Will need upper GI series.  5. PNA: CXR worsening, has diffuse bilateral infiltrates but remains stable on 10 L HFNC.  Suspect aspiration PNA/pneumonitis in setting of intractable hiccups. Worried that he has developed ARDS  picture.  Afebrile, WBCs 18.  Discussed with pulmonary, think amiodarone toxicity unlikely as he has only been on it post-op.  - Continue Zosyn, add vancomycin.  - Sputum culture if able to produce.  - NPO with Cortrak pending swallow evaluation.  6. Atypical atrial flutter: Persistent now.    - Continue amiodarone 30 mg/hr gtt for rate control.  - Anticoagulation has been held for now with effusive/constrictive pericarditis picture. He is on Lovenox prophylaxis.  7. AKI: Creatinine up to 2.47 after aggressive diuresis and drop in BP.  Creatinine now down to 1.5 with CVP 13. - Attempt gentle diuresis as above.  8. Hyponatremia: Suspect hypervolemic hypernatremia.  - Gentle diuresis.   CRITICAL CARE Performed by: Loralie Champagne  Total critical care time: 40 minutes  Critical care time was exclusive of  separately billable procedures and treating other patients.  Critical care was necessary to treat or prevent imminent or life-threatening deterioration.  Critical care was time spent personally by me on the following activities: development of treatment plan with patient and/or surrogate as well as nursing, discussions with consultants, evaluation of patient's response to treatment, examination of patient, obtaining history from patient or surrogate, ordering and performing treatments and interventions, ordering and review of laboratory studies, ordering and review of radiographic studies, pulse oximetry and re-evaluation of patient's condition.   Loralie Champagne 05/19/2021 7:47 AM

## 2021-05-20 ENCOUNTER — Inpatient Hospital Stay (HOSPITAL_COMMUNITY): Payer: PPO

## 2021-05-20 DIAGNOSIS — J9601 Acute respiratory failure with hypoxia: Secondary | ICD-10-CM | POA: Diagnosis not present

## 2021-05-20 DIAGNOSIS — I249 Acute ischemic heart disease, unspecified: Secondary | ICD-10-CM | POA: Diagnosis not present

## 2021-05-20 DIAGNOSIS — Z951 Presence of aortocoronary bypass graft: Secondary | ICD-10-CM | POA: Diagnosis not present

## 2021-05-20 DIAGNOSIS — N179 Acute kidney failure, unspecified: Secondary | ICD-10-CM | POA: Diagnosis not present

## 2021-05-20 LAB — POCT I-STAT 7, (LYTES, BLD GAS, ICA,H+H)
Acid-Base Excess: 13 mmol/L — ABNORMAL HIGH (ref 0.0–2.0)
Acid-Base Excess: 8 mmol/L — ABNORMAL HIGH (ref 0.0–2.0)
Bicarbonate: 33.3 mmol/L — ABNORMAL HIGH (ref 20.0–28.0)
Calcium, Ion: 1.13 mmol/L — ABNORMAL LOW (ref 1.15–1.40)
Calcium, Ion: 1.13 mmol/L — ABNORMAL LOW (ref 1.15–1.40)
HCT: 28 % — ABNORMAL LOW (ref 39.0–52.0)
HCT: 27 % — ABNORMAL LOW (ref 39.0–52.0)
Hemoglobin: 9.5 g/dL — ABNORMAL LOW (ref 13.0–17.0)
Hemoglobin: 9.2 g/dL — ABNORMAL LOW (ref 13.0–17.0)
O2 Saturation: 97 %
O2 Saturation: 93 %
Patient temperature: 98.6
Potassium: 3.4 mmol/L — ABNORMAL LOW (ref 3.5–5.1)
Potassium: 3.5 mmol/L (ref 3.5–5.1)
Sodium: 131 mmol/L — ABNORMAL LOW (ref 135–145)
Sodium: 132 mmol/L — ABNORMAL LOW (ref 135–145)
TCO2: 35 mmol/L — ABNORMAL HIGH (ref 22–32)
pCO2 arterial: 47.6 mmHg (ref 32.0–48.0)
pH, Arterial: 7.453 — ABNORMAL HIGH (ref 7.350–7.450)
pO2, Arterial: 64 mmHg — ABNORMAL LOW (ref 83.0–108.0)

## 2021-05-20 LAB — BASIC METABOLIC PANEL
Anion gap: 12 (ref 5–15)
BUN: 28 mg/dL — ABNORMAL HIGH (ref 8–23)
CO2: 33 mmol/L — ABNORMAL HIGH (ref 22–32)
Calcium: 8.4 mg/dL — ABNORMAL LOW (ref 8.9–10.3)
Chloride: 89 mmol/L — ABNORMAL LOW (ref 98–111)
Creatinine, Ser: 1.38 mg/dL — ABNORMAL HIGH (ref 0.61–1.24)
GFR, Estimated: 55 mL/min — ABNORMAL LOW (ref >60)
Glucose, Bld: 134 mg/dL — ABNORMAL HIGH (ref 70–99)
Potassium: 3.5 mmol/L (ref 3.5–5.1)
Sodium: 134 mmol/L — ABNORMAL LOW (ref 135–145)

## 2021-05-20 LAB — GLUCOSE, CAPILLARY
Glucose-Capillary: 128 mg/dL — ABNORMAL HIGH (ref 70–99)
Glucose-Capillary: 144 mg/dL — ABNORMAL HIGH (ref 70–99)
Glucose-Capillary: 150 mg/dL — ABNORMAL HIGH (ref 70–99)
Glucose-Capillary: 160 mg/dL — ABNORMAL HIGH (ref 70–99)
Glucose-Capillary: 167 mg/dL — ABNORMAL HIGH (ref 70–99)
Glucose-Capillary: 171 mg/dL — ABNORMAL HIGH (ref 70–99)
Glucose-Capillary: 96 mg/dL (ref 70–99)
Glucose-Capillary: 140 mg/dL — ABNORMAL HIGH (ref 70–99)
Glucose-Capillary: 200 mg/dL — ABNORMAL HIGH (ref 70–99)
Glucose-Capillary: 193 mg/dL — ABNORMAL HIGH (ref 70–99)
Glucose-Capillary: 150 mg/dL — ABNORMAL HIGH (ref 70–99)

## 2021-05-20 LAB — COOXEMETRY PANEL
Carboxyhemoglobin: 1.6 % — ABNORMAL HIGH (ref 0.5–1.5)
Methemoglobin: 1.3 % (ref 0.0–1.5)
O2 Saturation: 54.1 %
Total hemoglobin: 9.1 g/dL — ABNORMAL LOW (ref 12.0–16.0)

## 2021-05-20 LAB — RESP PANEL BY RT-PCR (FLU A&B, COVID) ARPGX2
Influenza A by PCR: NEGATIVE
Influenza B by PCR: NEGATIVE
SARS Coronavirus 2 by RT PCR: NEGATIVE

## 2021-05-20 LAB — CBC
HCT: 26.7 % — ABNORMAL LOW (ref 39.0–52.0)
Hemoglobin: 9.2 g/dL — ABNORMAL LOW (ref 13.0–17.0)
MCH: 30.3 pg (ref 26.0–34.0)
MCHC: 34.5 g/dL (ref 30.0–36.0)
MCV: 87.8 fL (ref 80.0–100.0)
Platelets: 287 10*3/uL (ref 150–400)
RBC: 3.04 MIL/uL — ABNORMAL LOW (ref 4.22–5.81)
RDW: 13.9 % (ref 11.5–15.5)
WBC: 21.5 10*3/uL — ABNORMAL HIGH (ref 4.0–10.5)
nRBC: 0 % (ref 0.0–0.2)

## 2021-05-20 LAB — MAGNESIUM: Magnesium: 1.9 mg/dL (ref 1.7–2.4)

## 2021-05-20 LAB — MRSA NEXT GEN BY PCR, NASAL: MRSA by PCR Next Gen: NOT DETECTED

## 2021-05-20 MED ORDER — DEXMEDETOMIDINE HCL IN NACL 400 MCG/100ML IV SOLN
0.2000 ug/kg/h | INTRAVENOUS | Status: DC
  Administered 2021-05-20: 0.5 ug/kg/h via INTRAVENOUS
  Administered 2021-05-20: 0.2 ug/kg/h via INTRAVENOUS
  Administered 2021-05-21: 0.8 ug/kg/h via INTRAVENOUS
  Filled 2021-05-20 (×3): qty 100

## 2021-05-20 MED ORDER — POTASSIUM CHLORIDE 20 MEQ PO PACK
40.0000 meq | PACK | Freq: Four times a day (QID) | ORAL | Status: DC
  Administered 2021-05-20 – 2021-05-21 (×5): 40 meq
  Filled 2021-05-20 (×6): qty 2

## 2021-05-20 MED ORDER — ACETAZOLAMIDE SODIUM 500 MG IJ SOLR
500.0000 mg | Freq: Once | INTRAMUSCULAR | Status: AC
  Administered 2021-05-20: 500 mg via INTRAVENOUS
  Filled 2021-05-20: qty 500

## 2021-05-20 MED ORDER — DIAZEPAM 2 MG PO TABS
2.0000 mg | ORAL_TABLET | Freq: Four times a day (QID) | ORAL | Status: DC | PRN
  Administered 2021-05-20 – 2021-05-31 (×8): 2 mg
  Filled 2021-05-20 (×8): qty 1

## 2021-05-20 MED ORDER — STERILE WATER FOR INJECTION IJ SOLN
INTRAMUSCULAR | Status: AC
  Administered 2021-05-20: 5 mL via INTRAVENOUS
  Filled 2021-05-20: qty 10

## 2021-05-20 MED ORDER — PANTOPRAZOLE 2 MG/ML SUSPENSION
40.0000 mg | Freq: Every day | ORAL | Status: DC
  Administered 2021-05-20 – 2021-05-21 (×2): 40 mg
  Filled 2021-05-20 (×2): qty 20

## 2021-05-20 MED ORDER — LEVALBUTEROL HCL 0.63 MG/3ML IN NEBU
0.6300 mg | INHALATION_SOLUTION | Freq: Four times a day (QID) | RESPIRATORY_TRACT | Status: DC | PRN
  Administered 2021-05-21 (×2): 0.63 mg via RESPIRATORY_TRACT
  Filled 2021-05-20 (×3): qty 3

## 2021-05-20 MED ORDER — VANCOMYCIN HCL 750 MG/150ML IV SOLN
750.0000 mg | Freq: Two times a day (BID) | INTRAVENOUS | Status: DC
  Administered 2021-05-20 – 2021-05-23 (×7): 750 mg via INTRAVENOUS
  Filled 2021-05-20 (×7): qty 150

## 2021-05-20 MED ORDER — FUROSEMIDE 10 MG/ML IJ SOLN
60.0000 mg | Freq: Once | INTRAMUSCULAR | Status: AC
  Administered 2021-05-20: 60 mg via INTRAVENOUS
  Filled 2021-05-20: qty 6

## 2021-05-20 NOTE — Progress Notes (Addendum)
NAME:  Randall Hodges, MRN:  [id number], DOB:  10/21/1950, LOS: 39 ADMISSION DATE:  05/14/2021, CONSULTATION DATE: 05/13/2021 REFERRING MD:  Dahlia Byes, MD, CHIEF COMPLAINT: Increasing shortness of breath  History of Present Illness:  71 year old male with coronary artery disease who initially presented with chest pain, noted to have multivessel coronary artery disease, he underwent CABG x3 on 04/1818 23, course was complicated with atrial fibrillation, frequent hiccups and aspiration pneumonia leading to hypoxia and increasing oxygen requirement. PCCM was consulted for evaluation and help with management  Patient stated hiccups are better but continued complain of shortness of breath, cough unable to bring up phlegm.  Denies chest pain or palpitation During my evaluation patient is on high flow nasal cannula oxygen at 12 L and he is requiring low-dose Levophed to maintain MAP above 65.  Of note patient had echocardiogram repeated today, consistent with constrictive picture.  Pertinent  Medical History   Past Medical History:  Diagnosis Date   Allergic rhinitis    Prostate cancer ([place])      Significant Hospital Events: Including procedures, antibiotic start and stop dates in addition to other pertinent events     Interim History / Subjective:  Overnight patient had increased work of breathing, became hypoxic and tachypneic, placed on BiPAP with improvement Started on Precedex for anxiety and agitation  Came off of vasopressors X-ray chest looks improved from yesterday  Received 60 mg Lasix twice yesterday with good diuresis and now with CVP of 7  Converted back to sinus rhythm with multiple PVCs  Objective   Blood pressure 118/62, pulse 92, temperature 100 F (37.8 C), temperature source Axillary, resp. rate (!) 28, height 5\' 10"  (1.778 m), weight 84.2 kg, SpO2 90 %. CVP:  [7 mmHg-12 mmHg] 7 mmHg  FiO2 (%):  [70 %-80 %] 80 %   Intake/Output Summary (Last 24 hours) at  05/20/2021 0750 Last data filed at 05/20/2021 0700 Gross per 24 hour  Intake 1973.77 ml  Output 3575 ml  Net -1601.23 ml   Filed Weights   05/17/21 0321 05/19/21 0500 05/20/21 0500  Weight: 81.1 kg 86.6 kg 84.2 kg    Examination: Physical exam: General: Acute on chronically ill-appearing male, lying on the bed.  On BiPAP  HEENT: [place]/AT, eyes anicteric.  moist mucus membranes Neuro: Alert, awake following commands, moving all 4 extremities Chest: Bilateral basal crackles all over, no wheezes or rhonchi Heart: Regular rate and rhythm, multiple PVCs, no murmurs or gallops.  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 He will need stripping of pericardium once respiratory status improves GI is following for hiccups, continue Valium and Reglan, better with meds Overnight he became short of breath, currently on BiPAP with 70% FiO2 His work of breathing has improved Continue Precedex for anxiety X-ray chest looks much improved from yesterday but continued to have bilateral infiltrates Discussed with heart failure team, will give him 60 mg Lasix x1 today Received 1 dose of Diamox 500 mg IV this morning CVP  7 Unable to get sputum sample for culture and sensitivities He is off vasopressors, procalcitonin is 0.3.  Though white count is up to 21 with low-grade fever overnight, suspicion of  infection remain high Continue vancomycin and Zosyn Patient converted to sinus rhythm Continue IV amiodarone Patient with multiple PVCs Monitor and supplement electrolytes Serum creatinine continue to improve now is down to 1.3 Monitor intake and output Avoid nephrotoxic agent Continue to closely monitor electrolytes   Best Practice (right click and "Reselect all SmartList Selections" daily)   Diet/type: NPO tube feeds, fees is a scheduled today DVT prophylaxis: Lovenox GI prophylaxis: PPI Lines: N/A Foley:  N/A Code Status:  full code Last date of multidisciplinary goals of care discussion [Per primary team]  Labs   CBC: Recent Labs  Lab 05/14/21 0148 05/15/21 0117 05/17/21 1619 05/17/21 1818 04/24/2021 0314 05/09/2021 1629 05/19/21 0209 05/20/21 0342  WBC 12.4* 13.0*  --   --  19.4*  --  18.0* 21.5*  HGB 9.6* 9.6*   < > 12.6* 8.7* 9.5*   9.9* 8.4* 9.2*  HCT 28.7* 27.9*   < > 37.0* 26.1* 28.0*   29.0* 25.5* 26.7*  MCV 90.8 88.0  --   --  89.4  --  88.9 87.8  PLT 174 218  --   --  301  --  307 287   < > = values in this interval not displayed.    Basic Metabolic Panel: Recent Labs  Lab 05/15/21 0117 05/17/21 0413 05/17/21 1619 05/17/21 1733 05/17/21 1818 05/17/2021 0314 05/13/2021 1629 05/19/21 0209 05/20/21 0342  NA 128* 128*   < > 127* 127* 127* 131*   130* 130* 134*  K 3.8 3.4*   < > 3.5 3.3* 3.6 3.6   3.7 3.8 3.5  CL 86* 82*  --  85*  --  84*  --  90* 89*  CO2 33* 31  --  32  --  30  --  30 33*  GLUCOSE 135* 124*  --  126*  --  206*  --  182* 134*  BUN 18 26*  --  34*  --  36*  --  33* 28*  CREATININE 1.16 1.79*  --  2.46*  --  2.11*  --  1.52* 1.38*  CALCIUM 8.2* 8.1*  --  7.6*  --  7.9*  --  8.2* 8.4*  MG 1.8  --   --  1.9  --   --   --   --   --    < > = values in this interval not displayed.   GFR: Estimated Creatinine Clearance: 50.7 mL/min (A) (by C-G formula based on SCr of 1.38 mg/dL (H)). Recent Labs  Lab 05/15/21 0117 05/02/2021 0314  04/27/2021 0910 05/19/21 0209 05/20/21 0342  PROCALCITON  --   --  0.33  --   --   WBC 13.0* 19.4*  --  18.0* 21.5*    Liver Function Tests: No results for input(s): AST, ALT, ALKPHOS, BILITOT, PROT, ALBUMIN in the last 168 hours. No results for input(s): LIPASE, AMYLASE in the last 168 hours. No results for input(s): AMMONIA in the last 168 hours.  ABG    Component Value Date/Time   PHART 7.514 (H) 05/17/2021 1818   PCO2ART 42.4 05/17/2021 1818   PO2ART 59 (L) 05/17/2021 1818   HCO3 32.0 (H) 05/17/2021 1629   HCO3 31.0 (H) 04/23/2021 1629   TCO2 33 (H) 05/11/2021 1629   TCO2 32 05/13/2021 1629  ACIDBASEDEF 3.0 (H) 05/08/2021 0323   O2SAT 54.1 05/20/2021 0502     Coagulation Profile: No results for input(s): INR, PROTIME in the last 168 hours.  Cardiac Enzymes: No results for input(s): CKTOTAL, CKMB, CKMBINDEX, TROPONINI in the last 168 hours.  HbA1C: Hgb A1c MFr Bld  Date/Time Value Ref Range Status  05/09/2021 09:26 AM 6.1 (H) 4.8 - 5.6 % Final    Comment:    (NOTE)         Prediabetes: 5.7 - 6.4         Diabetes: >6.4         Glycemic control for adults with diabetes: <7.0     CBG: Recent Labs  Lab 05/19/21 0017 05/19/21 0121 05/19/21 0332 05/19/21 0750 05/19/21 1051  GLUCAP 174* 182* 204* 156* 161*    Critical care time:     Total critical care time: 36 minutes  Performed by: [place] care time was exclusive of separately billable procedures and treating other patients.   Critical care was necessary to treat or prevent imminent or life-threatening deterioration.   Critical care was time spent personally by me on the following activities: development of treatment plan with patient and/or surrogate as well as nursing, discussions with consultants, evaluation of patient's response to treatment, examination of patient, obtaining history from patient or surrogate, ordering and performing treatments and interventions, ordering and review  of laboratory studies, ordering and review of radiographic studies, pulse oximetry and re-evaluation of patient's condition.   Jacky Kindle MD [facility] See Amion for pager If no response to pager, please call [phone] until 7pm After 7pm, Please call E-link [phone]

## 2021-05-20 NOTE — Progress Notes (Signed)
2100- Pt desaturates quickly when bipap is removed for oral care. SpO2 decreases into 70's within seconds.   0315: AM ABG obtained, results are not available in results review.   pH: 7.417 PCO2: 48.8 PO2: 60 BE: 7 HCO3: 32 TCO2: 34 sO2: 91  0437: RT called after patient reporting increased work of breathing and shortness of breath. BIPAP pressures increased to 14/5 at 100%.   0813Warren Lacy made aware of patient's symptoms and need for increased pressure/oxygen.   0530: Dr. Oletta Darter updated on patient. Pt states he is more comfortable with new settings of BIPAP.

## 2021-05-20 NOTE — Progress Notes (Addendum)
Patient ID: Randall Hodges, male   DOB: 03/07/1951, 71 y.o.   MRN: [id number]    Progress Note  Patient Name: Randall Hodges Date of Encounter: 05/20/2021  [facility] Cardiologist: [name], MD   Subjective   Hiccupping improved on valium.  Has CorTrak tube feeds. Remains NPO pending FEES at bedside.   CXR with persistent bilateral lung infiltrates, although somewhat improved from yesterday. Placed on BiPAP early this am d/t increased WOB.  He is on Zosyn + Vancomycin in setting of aspiration PNA. WBCs 21.5. T 100F this am.  MAPs 60s-80s. Off vasopressor support.  Appears to be back in SR. Frequent PVCs, ~30/min. He remains on amiodarone gtt  CVP 8. Co-ox 54%.  Good diuresis with IV lasix yesterday. Weight down 5 lb. Received IV diamox around 7 am.  Comfortable on BiPAP this am. Feels thirsty.  Cardiac Studies: Echo (limited, 1/30): Echo reviewed, there is clear respirophasic variation of the interventricular septum and marked respirophasic variation of E inflow velocity on doppler evaluation of the mitral valve.  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  LV EF 45-50%.   RHC Procedural Findings (on norepinephrine 6): Hemodynamics (mmHg) RA mean 12 RV 37/12 PA 38/16, mean 27 PCWP mean 11 LV 108/12 AO 96/55 PAPI 1.8 Oxygen saturations: PA 54% AO 94% Cardiac Output (Fick) 5.59  Cardiac Index (Fick) 2.81  PVR 2.8 WU Simultaneous RV/LV tracings were obtained.  Difficult to interpret due to atrial fibrillation.  There was some suggestion of discordance (ventricular interdependence) but not clear.  Inpatient Medications    Scheduled Meds:  aspirin  81 mg Per Tube Daily   chlorhexidine  15 mL Mouth Rinse BID   Chlorhexidine Gluconate Cloth  6 each Topical Daily   colchicine  0.6  mg Per Tube Daily   diazepam  1.25 mg Intravenous QHS   enoxaparin (LOVENOX) injection  40 mg Subcutaneous Q24H   guaiFENesin  30 mL Per Tube QID   insulin aspart  0-15 Units Subcutaneous Q4H   mouth rinse  15 mL Mouth Rinse q12n4p   metoCLOPramide (REGLAN) injection  10 mg Intravenous Q6H   pantoprazole (PROTONIX) IV  40 mg Intravenous Daily   rosuvastatin  10 mg Per Tube Daily   sodium chloride flush  10-40 mL Intracatheter Q12H   sodium chloride flush  3 mL Intravenous Q12H   sodium chloride flush  3 mL Intravenous Q12H   vancomycin variable dose per unstable renal function (pharmacist dosing)   Does not apply See admin instructions   Continuous Infusions:  sodium chloride Stopped (05/03/2021 1034)   sodium chloride     sodium chloride     amiodarone 30 mg/hr (05/20/21 0300)   dexmedetomidine (PRECEDEX) IV infusion 0.4 mcg/kg/hr (05/20/21 0615)   feeding supplement (VITAL 1.5 CAL) Stopped (05/20/21 0230)   norepinephrine (LEVOPHED) Adult infusion Stopped (05/19/21 1044)   piperacillin-tazobactam (ZOSYN)  IV Stopped (05/20/21 0604)   vasopressin Stopped (05/20/21 0228)   PRN Meds: Place/Maintain arterial line **AND** sodium chloride, sodium chloride, acetaminophen (TYLENOL) oral liquid 160 mg/5 mL, diazepam, hydrOXYzine, metoprolol tartrate, morphine injection, ondansetron (ZOFRAN) IV, sodium chloride flush, sodium chloride flush, traMADol   Vital Signs    Vitals:   05/20/21 0500 05/20/21 0530 05/20/21 0600 05/20/21 0620  BP:      Pulse: (!) 106 93 92   Resp:  (!) 31 (!)  28   Temp:      TempSrc:      SpO2:  93% 95% 90%  Weight: 84.2 kg     Height:        Intake/Output Summary (Last 24 hours) at 05/20/2021 0658 Last data filed at 05/20/2021 0615 Gross per 24 hour  Intake 1902.8 ml  Output 2975 ml  Net -1072.2 ml   Last 3 Weights 05/20/2021 05/19/2021 05/17/2021  Weight (lbs) 185 lb 10 oz 190 lb 14.7 oz 178 lb 12.7 oz  Weight (kg) 84.2 kg 86.6 kg 81.1 kg      Telemetry     SR 90s, ~30 PVCs/min (personally reviewed)  Physical Exam  CVP 8 General:  Comfortable on BiPAP HEENT: + CorTrak Neck: supple. JVP 8 cm. Carotids 2+ bilat; no bruits. No lymphadenopathy or thryomegaly appreciated. Cor: PMI nondisplaced. Regular rate & rhythm with ectopy. No rubs, gallops or murmurs. Lungs: coarse throughout Abdomen: soft, nontender, nondistended. No hepatosplenomegaly.  Extremities: no cyanosis, clubbing, rash, 1+  Neuro: sleeping but easily aroused.   Labs    High Sensitivity Troponin:   Recent Labs  Lab 05/16/2021 1321 05/11/2021 1518  TROPONINIHS 3 4     Chemistry Recent Labs  Lab 05/15/21 0117 05/17/21 0413 05/17/21 1733 05/17/21 1818 05/17/2021 0314 05/17/2021 1629 05/19/21 0209 05/20/21 0342  NA 128*   < > 127*   < > 127* 131*   130* 130* 134*  K 3.8   < > 3.5   < > 3.6 3.6   3.7 3.8 3.5  CL 86*   < > 85*  --  84*  --  90* 89*  CO2 33*   < > 32  --  30  --  30 33*  GLUCOSE 135*   < > 126*  --  206*  --  182* 134*  BUN 18   < > 34*  --  36*  --  33* 28*  CREATININE 1.16   < > 2.46*  --  2.11*  --  1.52* 1.38*  CALCIUM 8.2*   < > 7.6*  --  7.9*  --  8.2* 8.4*  MG 1.8  --  1.9  --   --   --   --   --   GFRNONAA >60   < > 27*  --  33*  --  49* 55*  ANIONGAP 9   < > 10  --  13  --  10 12   < > = values in this interval not displayed.    Lipids  No results for input(s): CHOL, TRIG, HDL, LABVLDL, LDLCALC, CHOLHDL in the last 168 hours.   Hematology Recent Labs  Lab 04/28/2021 0314 05/17/2021 1629 05/19/21 0209 05/20/21 0342  WBC 19.4*  --  18.0* 21.5*  RBC 2.92*  --  2.87* 3.04*  HGB 8.7* 9.5*   9.9* 8.4* 9.2*  HCT 26.1* 28.0*   29.0* 25.5* 26.7*  MCV 89.4  --  88.9 87.8  MCH 29.8  --  29.3 30.3  MCHC 33.3  --  32.9 34.5  RDW 13.9  --  13.8 13.9  PLT 301  --  307 287   Thyroid No results for input(s): TSH, FREET4 in the last 168 hours.  BNPNo results for input(s): BNP, PROBNP in the last 168 hours.  DDimer No results for input(s): DDIMER in  the last 168 hours.   Radiology    CARDIAC CATHETERIZATION  Result Date: 05/19/2021   Right Heart: Simultaneous RV/LV  tracings were obtained. Difficult to interpret due to atrial fibrillation. There was some suggestion of discordance (ventricular interdependence) but not clear. 1. RA pressure increased, PCWP normal range. 2. Equalization of diastolic pressures (RA, PCWP, LVEDP, RVEDP). 3. Preserved cardiac output. 4. Simultaneous RV/LV tracings were obtained.  Difficult to interpret due to atrial fibrillation.  There was some suggestion of discordance (ventricular interdependence) but not clear.  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  Simultaneous RV/LV tracings are not definitive of ventricular interdependence, however.   DG CHEST PORT 1 VIEW  Result Date: 05/20/2021 CLINICAL DATA:  71 year old male with shortness of breath. Status post CABG on 05/14/2021. EXAM: PORTABLE CHEST 1 VIEW COMPARISON:  Portable chest 05/19/2021 and earlier. FINDINGS: Portable AP upright view at 0522 hours. Enteric feeding tube and right PICC line remain in place. Prior CABG. Stable cardiac size and mediastinal contours. Diffuse, coarse bilateral pulmonary interstitial opacity although regressed confluent upper lobe predominant airspace opacity since yesterday. Mildly displaced posterior right 2nd rib fracture redemonstrated. No pneumothorax. No definite pleural effusion. Paucity of bowel gas. IMPRESSION: 1. Improved bilateral ventilation since yesterday although extensive residual coarse pulmonary interstitial opacity, indeterminate for edema versus infection. 2. Stable lines and tubes.  No new cardiopulmonary abnormality. Electronically Signed   By: Genevie Ann M.D.   On: 05/20/2021 06:36   DG Chest Port 1 View  Result Date: 05/19/2021 CLINICAL DATA:  Shortness of breath, weakness, recent CABG EXAM: PORTABLE CHEST 1 VIEW COMPARISON:  Chest radiograph 1 day prior FINDINGS: The  enteric catheter tip is off the field of view. The right upper extremity PICC tip is in the lower SVC. The cardiomediastinal silhouette is not well assessed. There are extensive opacities throughout both lungs primarily over the upper lobes, worsened in the interim. There are probable small bilateral pleural effusions. There is no appreciable pneumothorax. Mildly displaced fractures of the right first and second ribs are unchanged. IMPRESSION: Extensive opacities throughout both lungs primarily involving the upper lobes, worsened since the study from 1 day prior. Electronically Signed   By: Valetta Mole M.D.   On: 05/19/2021 08:06   DG Abd Portable 1V  Result Date: 05/04/2021 CLINICAL DATA:  Feeding tube placement. EXAM: PORTABLE ABDOMEN - 1 VIEW COMPARISON:  Abdominal x-ray dated June 23, 2017. FINDINGS: Feeding tube tip near the ligament of Treitz. Visualized bowel gas pattern is normal. Diffuse bilateral pulmonary airspace disease is unchanged. IMPRESSION: 1. Feeding tube tip near the ligament of Treitz. Electronically Signed   By: Titus Dubin M.D.   On: 05/01/2021 14:22   ECHOCARDIOGRAM LIMITED  Result Date: 04/19/2021    ECHOCARDIOGRAM LIMITED REPORT   Patient Name:   Randall Hodges Date of Exam: 04/29/2021 Medical Rec #:  [id number]       Height:       70.0 in Accession #:    [id number]      Weight:       178.8 lb Date of Birth:  12-12-1950       BSA:          1.990 m Patient Age:    60 years        BP:           115/64 mmHg Patient Gender: M               HR:           120 bpm. Exam Location:  Inpatient Procedure: Limited Echo and Cardiac Doppler Indications:  Pericardial effusion  History:        Patient has prior history of Echocardiogram examinations, most                 recent 05/13/2021.  Sonographer:    Arlyss Gandy Referring Phys: [place] Comments: Patient supine / sitting IMPRESSIONS  1. Limited study for pericardial effusion follow up.  2. A small pericardial  effusion is present. The pericardial effusion is circumferential. The right ventricle does not open well but evidence of collapse suggesting increased intracardia pressure. There is a drop in mitral inflow variation that is greater that 25% consistent with tamponande physiology. Septal movement consistent with ventricular interdependence. Tissue doppler not performed to assess for annulus reversus. IVC was not visualized.  3. Left ventricular ejection fraction, by estimation, is 45 to 50%. The left ventricle has mildly decreased function. The left ventricle has no regional wall motion abnormalities. Left ventricular diastolic parameters are indeterminate.  4. The right ventricle does not open well suggesting increase intracardiac pressure. . Right ventricular systolic function is normal. The right ventricular size is normal.  5. The mitral valve was not assessed.  6. The aortic valve is tricuspid.  7. The inferior vena cava is normal in size with greater than 50% respiratory variability, suggesting right atrial pressure of 3 mmHg. FINDINGS  Left Ventricle: Left ventricular ejection fraction, by estimation, is 45 to 50%. The left ventricle has mildly decreased function. The left ventricle has no regional wall motion abnormalities. The left ventricular internal cavity size was normal in size. There is no left ventricular hypertrophy. Left ventricular diastolic parameters are indeterminate. Right Ventricle: The right ventricle does not open well suggesting increase intracardiac pressure. The right ventricular size is normal. No increase in right ventricular wall thickness. Right ventricular systolic function is normal. Left Atrium: Left atrial size was normal in size. Right Atrium: Right atrial size was normal in size. Pericardium: A small pericardial effusion is present. The pericardial effusion is circumferential. There is excessive respiratory variation in septal movement. Mitral Valve: The mitral valve was not  assessed. Tricuspid Valve: The tricuspid valve is normal in structure. Tricuspid valve regurgitation is not demonstrated. No evidence of tricuspid stenosis. Aortic Valve: The aortic valve is tricuspid. Pulmonic Valve: The pulmonic valve was normal in structure. Pulmonic valve regurgitation is not visualized. No evidence of pulmonic stenosis. Aorta: Aortic root could not be assessed. Venous: The inferior vena cava is normal in size with greater than 50% respiratory variability, suggesting right atrial pressure of 3 mmHg. IAS/Shunts: No atrial level shunt detected by color flow Doppler. LEFT VENTRICLE PLAX 2D LVIDd:         3.70 cm LVIDs:         2.90 cm LV PW:         1.00 cm LV IVS:        1.00 cm  MITRAL VALVE MV Area (PHT): 5.27 cm MV Decel Time: 144 msec MV E velocity: 83.10 cm/s MV A velocity: 77.70 cm/s MV E/A ratio:  1.07 Kardie Tobb DO Electronically signed by Berniece Salines DO Signature Date/Time: 05/15/2021/11:20:15 AM    Final     Cardiac Studies   Cath 05/01/2021 Distal left main [place] 111 bifurcation stenosis with 75% left main, 90% ostial to proximal LAD, and 80-90% ostial circumflex (difficult to assess due to heavy calcification). Severe mid circumflex disease with 70% eccentric mid stenosis and second obtuse marginal containing ostial to proximal greater than 80% stenosis.  (Bifurcation  [address] 111 Severe calcification in left main and LAD in particular with diffuse 50% narrowing from proximal to mid vessel and tandem 70% stenoses in the mid LAD. Nondominant right coronary Normal LV function.  EF 55%.  LVEDP normal.    Patient Profile     71 y.o. male with PMH of PVCs presented with chest pain. Cardiac cath by Dr. Tamala Julian on 05/17/2021 showed 75% left main, 90% ost to prox LAD, 80-90% ost LCx, 70% mid LCx, 80% OM2, 50% prox to mid LAD, 70% mid LAD, EF 55%. Patient underwent CABG x 3 on 04/29/2021. Post op course complicated afib, treated with amio. CXR showed opacity in bilateral lung, started  abx on 1/25 and diuretic. Started on Eliquis due to recurrence of afib.    Assessment & Plan    1. CAD: Admitted with unstable angina, cath with severe left main and proximal LAD/LCx disease (nondominant RCA).  CABG x 3 on 1/18 with LIMA-LAD, SVG-OM, SVG-left PDA.   - Continue ASA 81, statin.  2. 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 RHC 1/30 with equalization of diastolic pressures.  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  He has mild pleuritic chest pain.   Deveoped hypotension/AKI after aggressive diuresis, BP-active meds and Lasix held.   - Restarted IV lasix 60 mg IV BID yesterday with worsening CXR. Scr slightly better at 1.38. Brisk diuresis noted. Received 500 mg diamox IV this am. CVP 8. Will give lasix 60 mg IV once. - With concern for development of post-surgical effusive/constrictive pericarditis, he is on colchicine. ESR 55. - ?need for surgical intervention on effusive/constrictive pericarditis. Will need improvement of lung disease first.  3. 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  Probably primarily septic/vasodilatory shock.   - Noww off vasopressors. 4. Hiccups: Intractable, this has been his main complaint. GI following, started on gabapentin and valium with some improvement.   - Will need upper GI series.  5. PNA: CXR with persistent bilateral infiltrates but slightly improved from yesterday. Now on BiPAP this am.  Suspect aspiration PNA/pneumonitis in setting of intractable hiccups. Worried that he has developed  ARDS picture.  T 100F this am. WBC trending up, 18>21.5.  Will check COVID test. - Discussed with pulmonary, think amiodarone toxicity unlikely as he has only been on it post-op.  - Continues Zosyn and Vanc - Sputum culture if able to produce.  - NPO with Cortrak pending swallow evaluation. Needs FEES 6. Atypical atrial flutter/PVCs: - Appears to be back in NSR this am - Continue amiodarone 30 mg/hr. ~ 30 PVCs/min on tele - Keep K >4 and Mag . 2 - Anticoagulation has been held for now with effusive/constrictive pericarditis picture. He is on Lovenox prophylaxis.  7. AKI: Creatinine up to 2.47 after aggressive diuresis and drop in BP.  Creatinine now down to 1.38 with CVP 8. - Diuresis as above 8. Hyponatremia: Suspect hypervolemic hypernatremia.  - Improving with diuresis. 134 this am.   Leata Mouse 05/20/2021 6:58 AM  Patient seen with PA, agree with the above note.   I/Os net negative 1601, CXR improved though still with bilateral diffuse infiltrates.  On Bipap this morning, saturation  99-100%. Creatinine improved to 1.38.  CVP 8-9 this morning .  WBCs 21.5, Tm 100, on vancomycin/Zosyn for PNA.   He is back in NSR on amiodarone gtt.   General: NAD Neck: JVP 8 cm, no thyromegaly or thyroid nodule.  Lungs: Crackles at bases.  CV: Nondisplaced PMI.  Heart regular S1/S2, no S3/S4, no murmur.  No peripheral edema.    Abdomen: Soft, nontender, no hepatosplenomegaly, no distention.  Skin: Intact without lesions or rashes.  Neurologic: Alert and oriented x 3.  Psych: Normal affect. Extremities: No clubbing or cyanosis.  HEENT: Normal.   Persistent bilateral infiltrates, some improvement with diuresis but still present.  Suspect triggered by aspiration PNA/pneumonitis. CVP down to 8-9. - Will give 1 dose Lasix 60 mg IV this morning.  - Continue vancomycin/Zosyn.  - Test for COVID-19.   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   - Continue  colchicine.  - May need to address surgically when pulmonary status improved.   NSR this morning on amiodarone gtt.  Only on Lovenox prophylaxis for anticoagulation with pericardial process.   Creatinine improving.   CRITICAL CARE Performed by: Loralie Champagne  Total critical care time: 40 minutes  Critical care time was exclusive of separately billable procedures and treating other patients.  Critical care was necessary to treat or prevent imminent or life-threatening deterioration.  Critical care was time spent personally by me on the following activities: development of treatment plan with patient and/or surrogate as well as nursing, discussions with consultants, evaluation of patient's response to treatment, examination of patient, obtaining history from patient or surrogate, ordering and performing treatments and interventions, ordering and review of laboratory studies, ordering and review of radiographic studies, pulse oximetry and re-evaluation of patient's condition.  Loralie Champagne 05/20/2021 7:51 AM

## 2021-05-20 NOTE — Progress Notes (Addendum)
[address]       [place] [address]             [phone]     14 Days Post-op CABG x 3   2 Days Post-Op Procedure(s) (LRB): RIGHT/LEFT HEART CATH AND CORONARY ANGIOGRAPHY (N/A) Subjective:   Became hypoxic with tachypnea and anxiety early this morning. Placed on BiPAP with improvement in oxygenation  Levophed and Vasopressin off Now on low-dose Precedex   CoOx 54  Objective: Vital signs in last 24 hours: Temp:  [97.2 F (36.2 C)-100 F (37.8 C)] 100 F (37.8 C) (02/01 0700) Pulse Rate:  [85-111] 91 (02/01 0800) Cardiac Rhythm: Normal sinus rhythm (02/01 0500) Resp:  [14-43] 29 (02/01 0800) BP: (145)/(60) 145/60 (02/01 0750) SpO2:  [84 %-100 %] 93 % (02/01 0800) Arterial Line BP: (109-183)/(48-78) 120/51 (02/01 0800) FiO2 (%):  [60 %-80 %] 60 % (02/01 0750) Weight:  [84.2 kg] 84.2 kg (02/01 0500) CVP:  [7 mmHg-12 mmHg] 8 mmHg  Intake/Output from previous day: 01/31 0701 - 02/01 0700 In: 1973.8 [I.V.:681.9; NG/GT:729.8; IV Piggyback:562] Out: 8341 [[id number]] Intake/Output this shift: No intake/output data recorded.  General appearance: sedated with Precedex but responds to voice.  Neurologic: intact Heart: back in SR with Frequent PVC's Lungs: breath sounds are coarse, few exp wheezes. On BiPAP, FiO2 0.6. CXR done while on BiPAP showing improved aeration.  Abdomen: soft, non-tender.  Extremities: 1-2+ LE edema. The RLE EVH incision is intact and dry.  Wound: the sternotomy incision remains well approximated and dry.   Lab Results: Recent Labs    05/19/21 0209 05/20/21 0342  WBC 18.0* 21.5*  HGB 8.4* 9.2*  HCT 25.5* 26.7*  PLT 307 287    BMET:  Recent Labs    05/19/21 0209 05/20/21 0342  NA 130* 134*  K 3.8 3.5  CL 90* 89*  CO2 30 33*  GLUCOSE 182* 134*  BUN 33* 28*  CREATININE 1.52* 1.38*  CALCIUM 8.2* 8.4*     PT/INR: No results for input(s): LABPROT, INR in the last 72 hours. ABG    Component Value Date/Time    PHART 7.514 (H) 05/17/2021 1818   HCO3 32.0 (H) 04/19/2021 1629   HCO3 31.0 (H) 04/22/2021 1629   TCO2 33 (H) 05/08/2021 1629   TCO2 32 04/25/2021 1629   ACIDBASEDEF 3.0 (H) 05/08/2021 0323   O2SAT 54.1 05/20/2021 0502   CBG (last 3)  Recent Labs    05/20/21 0356 05/20/21 0549 05/20/21 0733  GLUCAP 144* 128* 96    RIGHT/LEFT HEART CATH AND CORONARY ANGIOGRAPHY   Conclusion      Right Heart: Simultaneous RV/LV tracings were obtained. Difficult to interpret due to atrial fibrillation. There was some suggestion of discordance (ventricular interdependence) but not clear.   1. RA pressure increased, PCWP normal range.  2. Equalization of diastolic pressures (RA, PCWP, LVEDP, RVEDP).  3. Preserved cardiac output.  4. Simultaneous RV/LV tracings were obtained.  Difficult to interpret due to atrial fibrillation.  There was some suggestion of discordance (ventricular interdependence) but not clear.        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  Simultaneous RV/LV tracings are not definitive of ventricular interdependence, however.      Assessment/Plan: S/P Procedure(s) (LRB): RIGHT/LEFT HEART CATH AND CORONARY ANGIOGRAPHY (N/A)  -POD-14 CABG x 3 presenting with acute STEMI . EF 45% with global hypokinesis on echo 1/25, small pericardial effusion. 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 Planning for re-do sternotomy and pericardiectomy tomorrow.  On colchicine, avoiding NSAIDs due to marginal renal function.   -PULM- More hypoxic and had increase in WOB early this morning. Now on BiPAP. Suspected aspiration pneumonia with bilateral infiltrates on CXR. On Zosyn.  Sputum Cx ordered but apparently has not produced an adequate specimen.  Repeat COVID test also ordered.   -Paroxysmal a-fib / a-flutter- on amiodarone infusion.  Now appears to be in SR.   Eliquis discontinued, now on daily enoxaparin.    -GI- Had persistent hiccups for several days after surgery, better past 24 hours. GI following. On Reglan, Protonix and PRN IV Valium.  Currently NPO, supporting nutrition with TF via CorTrak.   -Acute renal insufficiency- Creat trending down, UO adequate. Monitor.     LOS: 15 days    Antony Odea, PA-C [phone] 05/20/2021  Patient more comfortable with low-dose Precedex on BiPAP Chest x-ray this morning personally reviewed showing improved aeration Fortunately not able to receive any tube feeds while on BiPAP Continuing vancomycin and Zosyn for aspiration pneumonia, bilateral  patient examined and medical record reviewed,agree with above note. Dahlia Byes 05/20/2021

## 2021-05-20 NOTE — Progress Notes (Addendum)
RN called RT regarding placing pt on bipap per MD for increase WOB. RN turned tube feeds off. Pt placed on bipap at this time.

## 2021-05-20 NOTE — Progress Notes (Signed)
OT Cancellation Note  Patient Details Name: RANON COVEN MRN: [id number] DOB: [birth year]   Cancelled Treatment:    Reason Eval/Treat Not Completed: Patient not medically ready (Bipap Rn requesting to hold at this time)hypoxic with tachypnea and anxiety  Billey Chang, OTR/L  Acute Rehabilitation Services Pager: [phone] Office: [phone] .  05/20/2021, 10:14 AM

## 2021-05-20 NOTE — Progress Notes (Signed)
Asked to evaluate respiratory status.  Patient has been on bipap for <2 hrs.   10/5 good tidal volumes in 400-500 range.  RR 20s. Sat 97% on 70% fio2.   HR and BP normal.  Has been anxious per nurse.   Better air movement with bipap.  Will continue bipap and dont see any immediate indication for emergent intubation /mechanical ventilation now.   ABG looks acceptable.   Start precedex for anxiety, hold offf on other meds.

## 2021-05-20 NOTE — Progress Notes (Addendum)
eLink Physician-Brief Progress Note Patient Name: ILDEFONSO KEANEY DOB: 01/21/1951 MRN: [id number]   Date of Service  05/20/2021  HPI/Events of Note  Tachypnea - RR got up into the 40's and Sats dropped into 80's. Dr. Prescott Gum concerned that he is tiring and placed him on BiPAP. ABG on BiPAP = 7.48/49/83/37.2. Dr. Prescott Gum requests PCCM evaluate the patient for possible intubation. Sat = 95% and RR = 22 on BiPAP. HR - 96 and BP = 140/60. His numbers look reasonable. However, he says that he is getting tired.  eICU Interventions  Plan: Will request that the PCCM ground team evaluate the patient at bedside.      Intervention Category Major Interventions: Other:  Jaliana Medellin Cornelia Copa 05/20/2021, 4:21 AM

## 2021-05-20 NOTE — Progress Notes (Signed)
SLP Cancellation Note  Patient Details Name: Randall Hodges MRN: [id number] DOB: [birth year]   Cancelled treatment:        Checked pt this morning and he was on Bipap. Called RN around 1300 and pt still on Bipap and RN will attempt to wean after bath. Pt not appropriate for instrumental swallow assessment at this time. Will continue efforts tomorrow.    Houston Siren 05/20/2021, 2:31 PM

## 2021-05-20 NOTE — Progress Notes (Signed)
Nutrition Follow-up  DOCUMENTATION CODES:   Not applicable  INTERVENTION:   Tube Feeding Cortrak (post-pyloric): Goal: Vital 1.5 at 60 ml/hr This provides 97 g of protein, 2160 kcals, 1094 mL of free water    NUTRITION DIAGNOSIS:   Inadequate oral intake related to inability to eat as evidenced by NPO status.  Being addressed via TF   GOAL:   Patient will meet greater than or equal to 90% of their needs  Progressing  MONITOR:   TF tolerance, Diet advancement, Labs, Weight trends  REASON FOR ASSESSMENT:   Rounds    ASSESSMENT:   71 yo male admitted with acute coronary syndrome, underwent CABG x 3 on 1/18; developed intractable hiccups/muscle spasms, aspiration pneumonia with respiratory failure. PMH includes prostate cancer  1/18 CABG x 3 1/29 Transferred back to ICU due to hypotension, worsening respiratory status 1/30 Cortrak placed-tip near LOT  Pt on Bipap this AM with increase work of breathing overnight. On precedex for anxiety/agitation  Remains NPO, Cortrak in place SLP following   Tolerating Vital 1.5 at 40 ml/hr per RN; began titrating TF yesterday  Labs: sodium 134 (L), CBGs 96-171 Meds: ss novolog, relgan, KCl    Diet Order:   Diet Order     None       EDUCATION NEEDS:   Education needs have been addressed  Skin:  Skin Assessment: Skin Integrity Issues: Skin Integrity Issues:: Incisions Incisions: sternum, leg, groin  Last BM:  1/30  Height:   Ht Readings from Last 1 Encounters:  04/27/2021 5\' 10"  (1.778 m)    Weight:   Wt Readings from Last 1 Encounters:  05/20/21 84.2 kg     BMI:  Body mass index is 26.63 kg/m.  Estimated Nutritional Needs:   Kcal:  2000-2200 kcals  Protein:  100-115 g  Fluid:  1.8 L  Kerman Passey MS, RDN, LDN, CNSC Registered Dietitian III Clinical Nutrition RD Pager and On-Call Pager Number Located in [place]

## 2021-05-20 NOTE — Progress Notes (Signed)
EVENING ROUNDS NOTE :     [address]       [place] [address]             [phone]                 2 Days Post-Op Procedure(s) (LRB): RIGHT/LEFT HEART CATH AND CORONARY ANGIOGRAPHY (N/A)   Total Length of Stay:  LOS: 15 days  Events:   No events On bipap Levo decreasing.     BP (!) 126/58 (BP Location: Left Leg)    Pulse 87    Temp 98.4 F (36.9 C) (Axillary)    Resp (!) 35    Ht 5\' 10"  (1.778 m)    Wt 84.2 kg    SpO2 93%    BMI 26.63 kg/m   CVP:  [7 mmHg-12 mmHg] 9 mmHg  FiO2 (%):  [60 %-80 %] 70 %   sodium chloride Stopped (04/27/2021 1034)   sodium chloride     sodium chloride     amiodarone 30 mg/hr (05/20/21 1800)   dexmedetomidine (PRECEDEX) IV infusion 0.3 mcg/kg/hr (05/20/21 1800)   feeding supplement (VITAL 1.5 CAL) 1,000 mL (05/20/21 1226)   norepinephrine (LEVOPHED) Adult infusion 6 mcg/min (05/20/21 1800)   piperacillin-tazobactam (ZOSYN)  IV 12.5 mL/hr at 05/20/21 1800   vancomycin Stopped (05/20/21 0923)   vasopressin 0.03 Units/min (05/20/21 1800)    I/O last 3 completed shifts: In: 3406.2 [I.V.:1333.3; Other:20; NG/GT:1282.5; IV Piggyback:770.3] Out: 2010 [OFHQR:9758]   CBC Latest Ref Rng & Units 05/20/2021 05/20/2021 05/20/2021  WBC 4.0 - 10.5 K/uL - 21.5(H) -  Hemoglobin 13.0 - 17.0 g/dL 9.2(L) 9.2(L) 9.5(L)  Hematocrit 39.0 - 52.0 % 27.0(L) 26.7(L) 28.0(L)  Platelets 150 - 400 K/uL - 287 -    BMP Latest Ref Rng & Units 05/20/2021 05/20/2021 05/20/2021  Glucose 70 - 99 mg/dL - 134(H) -  BUN 8 - 23 mg/dL - 28(H) -  Creatinine 0.61 - 1.24 mg/dL - 1.38(H) -  BUN/Creat Ratio 10 - 24 - - -  Sodium 135 - 145 mmol/L 132(L) 134(L) 131(L)  Potassium 3.5 - 5.1 mmol/L 3.5 3.5 3.4(L)  Chloride 98 - 111 mmol/L - 89(L) -  CO2 22 - 32 mmol/L - 33(H) -  Calcium 8.9 - 10.3 mg/dL - 8.4(L) -    ABG    Component Value Date/Time   PHART 7.453 (H) 05/20/2021 1719   PCO2ART 47.6 05/20/2021 1719   PO2ART 64 (L) 05/20/2021 1719   HCO3 33.3 (H)  05/20/2021 1719   TCO2 35 (H) 05/20/2021 1719   ACIDBASEDEF PENDING 05/20/2021 0232   O2SAT 93.0 05/20/2021 Kimberly, MD 05/20/2021 7:21 PM

## 2021-05-20 NOTE — Progress Notes (Signed)
ABG results not crossing over into Epic. Pt on BiPAP 70%  pH 7.453 PCO2 47.6 PO2 64 HCO3 33.3

## 2021-05-20 NOTE — Progress Notes (Signed)
Dr. Darcey Nora paged at [facility]. Informed patient has become increasingly short of breath, SpO2 dropping into mid 80's on 11 L, which has now been increased to 15L HFNC. Work of breathing becoming harder for patient with respirations in high 40's and appearing fatigued. ABG obtained and Bpap placed.   ABG as follows: pH: 7.487 PCO2: 50.3 P02: 86 BE:13 HCO3: 38 TCO2: 40 02: 97  BPAP settings as follows: 10/5 80% O2   At this time patient is on BPAP and is appearing more comfortable and respirations are in mid 20's. SpO2 is now reading 97%.  Repeat ABG due at 0335.   61- Pt's wife called for update which was given.   0350-Dr. Vantrigt updated with latest ABG results. Requested contact Elink to lay eyes on patient.  pH: 7.48 PCO2: 49 P02: 83 BE:12 HCO3: 37.2 TCO2: 39 02: 97   0445- Dr. Duwayne Heck in to see patient. Orders to continue Bpap and start precedex.   0600- Repeat ABG obtained, report to Vantrigt.   pH: 7.5 PCO2: 48.1 P02: 69 BE:13 HCO3: 38.1 TCO2: 40 02: 95

## 2021-05-20 NOTE — Progress Notes (Addendum)
ABG not crossing over into results review:  Values displayed below Sample drawn at 10:11:  pH:7.49 CO2:46 PO2:85 BE:11 HCO3:35.7 SO2%:97

## 2021-05-20 DEATH — deceased

## 2021-05-21 ENCOUNTER — Encounter (HOSPITAL_COMMUNITY): Admission: EM | Disposition: E | Payer: Self-pay | Source: Home / Self Care | Attending: Cardiothoracic Surgery

## 2021-05-21 ENCOUNTER — Inpatient Hospital Stay (HOSPITAL_COMMUNITY): Payer: PPO

## 2021-05-21 ENCOUNTER — Inpatient Hospital Stay (HOSPITAL_COMMUNITY): Payer: PPO | Admitting: Anesthesiology

## 2021-05-21 ENCOUNTER — Telehealth: Payer: Self-pay

## 2021-05-21 DIAGNOSIS — J9602 Acute respiratory failure with hypercapnia: Secondary | ICD-10-CM

## 2021-05-21 DIAGNOSIS — I5021 Acute systolic (congestive) heart failure: Secondary | ICD-10-CM | POA: Diagnosis not present

## 2021-05-21 DIAGNOSIS — J9601 Acute respiratory failure with hypoxia: Secondary | ICD-10-CM | POA: Diagnosis not present

## 2021-05-21 DIAGNOSIS — J80 Acute respiratory distress syndrome: Secondary | ICD-10-CM | POA: Diagnosis not present

## 2021-05-21 DIAGNOSIS — L8990 Pressure ulcer of unspecified site, unspecified stage: Secondary | ICD-10-CM | POA: Insufficient documentation

## 2021-05-21 DIAGNOSIS — N179 Acute kidney failure, unspecified: Secondary | ICD-10-CM | POA: Diagnosis not present

## 2021-05-21 DIAGNOSIS — I249 Acute ischemic heart disease, unspecified: Secondary | ICD-10-CM | POA: Diagnosis not present

## 2021-05-21 DIAGNOSIS — A419 Sepsis, unspecified organism: Secondary | ICD-10-CM

## 2021-05-21 DIAGNOSIS — R6521 Severe sepsis with septic shock: Secondary | ICD-10-CM | POA: Diagnosis not present

## 2021-05-21 DIAGNOSIS — I213 ST elevation (STEMI) myocardial infarction of unspecified site: Secondary | ICD-10-CM | POA: Diagnosis not present

## 2021-05-21 DIAGNOSIS — Z951 Presence of aortocoronary bypass graft: Secondary | ICD-10-CM | POA: Diagnosis not present

## 2021-05-21 HISTORY — PX: ECMO CANNULATION: CATH118321

## 2021-05-21 LAB — RESPIRATORY PANEL BY PCR

## 2021-05-21 LAB — BASIC METABOLIC PANEL
Anion gap: 10 (ref 5–15)
Anion gap: 12 (ref 5–15)
BUN: 31 mg/dL — ABNORMAL HIGH (ref 8–23)
BUN: 39 mg/dL — ABNORMAL HIGH (ref 8–23)
CO2: 30 mmol/L (ref 22–32)
CO2: 31 mmol/L (ref 22–32)
Calcium: 8.1 mg/dL — ABNORMAL LOW (ref 8.9–10.3)
Calcium: 8.1 mg/dL — ABNORMAL LOW (ref 8.9–10.3)
Chloride: 93 mmol/L — ABNORMAL LOW (ref 98–111)
Chloride: 95 mmol/L — ABNORMAL LOW (ref 98–111)
Creatinine, Ser: 1.37 mg/dL — ABNORMAL HIGH (ref 0.61–1.24)
Creatinine, Ser: 1.87 mg/dL — ABNORMAL HIGH (ref 0.61–1.24)
GFR, Estimated: 55 mL/min — ABNORMAL LOW (ref >60)
GFR, Estimated: 38 mL/min — ABNORMAL LOW (ref >60)
Glucose, Bld: 177 mg/dL — ABNORMAL HIGH (ref 70–99)
Glucose, Bld: 117 mg/dL — ABNORMAL HIGH (ref 70–99)
Potassium: 3.9 mmol/L (ref 3.5–5.1)
Potassium: 4.6 mmol/L (ref 3.5–5.1)
Sodium: 133 mmol/L — ABNORMAL LOW (ref 135–145)
Sodium: 138 mmol/L (ref 135–145)

## 2021-05-21 LAB — CBC
HCT: 25.6 % — ABNORMAL LOW (ref 39.0–52.0)
HCT: 23.8 % — ABNORMAL LOW (ref 39.0–52.0)
HCT: 23.2 % — ABNORMAL LOW (ref 39.0–52.0)
Hemoglobin: 8.6 g/dL — ABNORMAL LOW (ref 13.0–17.0)
Hemoglobin: 7.6 g/dL — ABNORMAL LOW (ref 13.0–17.0)
Hemoglobin: 7.8 g/dL — ABNORMAL LOW (ref 13.0–17.0)
MCH: 30 pg (ref 26.0–34.0)
MCH: 28.8 pg (ref 26.0–34.0)
MCH: 30 pg (ref 26.0–34.0)
MCHC: 33.6 g/dL (ref 30.0–36.0)
MCHC: 31.9 g/dL (ref 30.0–36.0)
MCHC: 33.6 g/dL (ref 30.0–36.0)
MCV: 89.2 fL (ref 80.0–100.0)
MCV: 90.2 fL (ref 80.0–100.0)
MCV: 89.2 fL (ref 80.0–100.0)
Platelets: 269 10*3/uL (ref 150–400)
Platelets: 185 10*3/uL (ref 150–400)
Platelets: 181 10*3/uL (ref 150–400)
RBC: 2.87 MIL/uL — ABNORMAL LOW (ref 4.22–5.81)
RBC: 2.64 MIL/uL — ABNORMAL LOW (ref 4.22–5.81)
RBC: 2.6 MIL/uL — ABNORMAL LOW (ref 4.22–5.81)
RDW: 14.4 % (ref 11.5–15.5)
RDW: 15 % (ref 11.5–15.5)
RDW: 15 % (ref 11.5–15.5)
WBC: 24.2 10*3/uL — ABNORMAL HIGH (ref 4.0–10.5)
WBC: 20.4 10*3/uL — ABNORMAL HIGH (ref 4.0–10.5)
WBC: 16.3 10*3/uL — ABNORMAL HIGH (ref 4.0–10.5)
nRBC: 0 % (ref 0.0–0.2)
nRBC: 0 % (ref 0.0–0.2)
nRBC: 0 % (ref 0.0–0.2)

## 2021-05-21 LAB — COOXEMETRY PANEL
Carboxyhemoglobin: 1.4 % (ref 0.5–1.5)
Methemoglobin: 1 % (ref 0.0–1.5)
O2 Saturation: 62.9 %
Total hemoglobin: 9.6 g/dL — ABNORMAL LOW (ref 12.0–16.0)

## 2021-05-21 LAB — POCT I-STAT 7, (LYTES, BLD GAS, ICA,H+H)
Acid-Base Excess: 7 mmol/L — ABNORMAL HIGH (ref 0.0–2.0)
Acid-Base Excess: 7 mmol/L — ABNORMAL HIGH (ref 0.0–2.0)
Acid-Base Excess: 9 mmol/L — ABNORMAL HIGH (ref 0.0–2.0)
Acid-Base Excess: 9 mmol/L — ABNORMAL HIGH (ref 0.0–2.0)
Acid-Base Excess: 8 mmol/L — ABNORMAL HIGH (ref 0.0–2.0)
Acid-Base Excess: 7 mmol/L — ABNORMAL HIGH (ref 0.0–2.0)
Bicarbonate: 32 mmol/L — ABNORMAL HIGH (ref 20.0–28.0)
Bicarbonate: 36.3 mmol/L — ABNORMAL HIGH (ref 20.0–28.0)
Bicarbonate: 37.8 mmol/L — ABNORMAL HIGH (ref 20.0–28.0)
Bicarbonate: 34.6 mmol/L — ABNORMAL HIGH (ref 20.0–28.0)
Bicarbonate: 32.5 mmol/L — ABNORMAL HIGH (ref 20.0–28.0)
Bicarbonate: 31.8 mmol/L — ABNORMAL HIGH (ref 20.0–28.0)
Calcium, Ion: 1.19 mmol/L (ref 1.15–1.40)
Calcium, Ion: 1.24 mmol/L (ref 1.15–1.40)
Calcium, Ion: 1.19 mmol/L (ref 1.15–1.40)
Calcium, Ion: 1.09 mmol/L — ABNORMAL LOW (ref 1.15–1.40)
Calcium, Ion: 1.13 mmol/L — ABNORMAL LOW (ref 1.15–1.40)
Calcium, Ion: 1.15 mmol/L (ref 1.15–1.40)
HCT: 29 % — ABNORMAL LOW (ref 39.0–52.0)
HCT: 28 % — ABNORMAL LOW (ref 39.0–52.0)
HCT: 28 % — ABNORMAL LOW (ref 39.0–52.0)
HCT: 22 % — ABNORMAL LOW (ref 39.0–52.0)
HCT: 26 % — ABNORMAL LOW (ref 39.0–52.0)
HCT: 25 % — ABNORMAL LOW (ref 39.0–52.0)
Hemoglobin: 9.9 g/dL — ABNORMAL LOW (ref 13.0–17.0)
Hemoglobin: 9.5 g/dL — ABNORMAL LOW (ref 13.0–17.0)
Hemoglobin: 9.5 g/dL — ABNORMAL LOW (ref 13.0–17.0)
Hemoglobin: 7.5 g/dL — ABNORMAL LOW (ref 13.0–17.0)
Hemoglobin: 8.8 g/dL — ABNORMAL LOW (ref 13.0–17.0)
Hemoglobin: 8.5 g/dL — ABNORMAL LOW (ref 13.0–17.0)
O2 Saturation: 91 %
O2 Saturation: 99 %
O2 Saturation: 100 %
O2 Saturation: 100 %
O2 Saturation: 99 %
O2 Saturation: 98 %
Patient temperature: 97.8
Patient temperature: 37
Patient temperature: 35.9
Patient temperature: 36.5
Patient temperature: 36.6
Potassium: 3.8 mmol/L (ref 3.5–5.1)
Potassium: 4.9 mmol/L (ref 3.5–5.1)
Potassium: 5 mmol/L (ref 3.5–5.1)
Potassium: 4.5 mmol/L (ref 3.5–5.1)
Potassium: 4.7 mmol/L (ref 3.5–5.1)
Potassium: 4.9 mmol/L (ref 3.5–5.1)
Sodium: 133 mmol/L — ABNORMAL LOW (ref 135–145)
Sodium: 137 mmol/L (ref 135–145)
Sodium: 137 mmol/L (ref 135–145)
Sodium: 137 mmol/L (ref 135–145)
Sodium: 136 mmol/L (ref 135–145)
Sodium: 137 mmol/L (ref 135–145)
TCO2: 34 mmol/L — ABNORMAL HIGH (ref 22–32)
TCO2: 39 mmol/L — ABNORMAL HIGH (ref 22–32)
TCO2: 40 mmol/L — ABNORMAL HIGH (ref 22–32)
TCO2: 36 mmol/L — ABNORMAL HIGH (ref 22–32)
TCO2: 34 mmol/L — ABNORMAL HIGH (ref 22–32)
TCO2: 33 mmol/L — ABNORMAL HIGH (ref 22–32)
pCO2 arterial: 48.8 mmHg — ABNORMAL HIGH (ref 32.0–48.0)
pCO2 arterial: 84.1 mmHg (ref 32.0–48.0)
pCO2 arterial: 76.9 mmHg (ref 32.0–48.0)
pCO2 arterial: 50.2 mmHg — ABNORMAL HIGH (ref 32.0–48.0)
pCO2 arterial: 43.9 mmHg (ref 32.0–48.0)
pCO2 arterial: 46.2 mmHg (ref 32.0–48.0)
pH, Arterial: 7.423 (ref 7.350–7.450)
pH, Arterial: 7.243 — ABNORMAL LOW (ref 7.350–7.450)
pH, Arterial: 7.3 — ABNORMAL LOW (ref 7.350–7.450)
pH, Arterial: 7.442 (ref 7.350–7.450)
pH, Arterial: 7.475 — ABNORMAL HIGH (ref 7.350–7.450)
pH, Arterial: 7.444 (ref 7.350–7.450)
pO2, Arterial: 60 mmHg — ABNORMAL LOW (ref 83.0–108.0)
pO2, Arterial: 156 mmHg — ABNORMAL HIGH (ref 83.0–108.0)
pO2, Arterial: 270 mmHg — ABNORMAL HIGH (ref 83.0–108.0)
pO2, Arterial: 440 mmHg — ABNORMAL HIGH (ref 83.0–108.0)
pO2, Arterial: 146 mmHg — ABNORMAL HIGH (ref 83.0–108.0)
pO2, Arterial: 105 mmHg (ref 83.0–108.0)

## 2021-05-21 LAB — LACTIC ACID, PLASMA
Lactic Acid, Venous: 1.9 mmol/L (ref 0.5–1.9)
Lactic Acid, Venous: 2.3 mmol/L (ref 0.5–1.9)
Lactic Acid, Venous: 2.8 mmol/L (ref 0.5–1.9)

## 2021-05-21 LAB — PROTIME-INR
INR: 2 — ABNORMAL HIGH (ref 0.8–1.2)
Prothrombin Time: 22.5 seconds — ABNORMAL HIGH (ref 11.4–15.2)

## 2021-05-21 LAB — HEPARIN LEVEL (UNFRACTIONATED)
Heparin Unfractionated: 0.68 IU/mL (ref 0.30–0.70)
Heparin Unfractionated: 0.18 IU/mL — ABNORMAL LOW (ref 0.30–0.70)

## 2021-05-21 LAB — HEPATIC FUNCTION PANEL
ALT: 472 U/L — ABNORMAL HIGH (ref 0–44)
AST: 1077 U/L — ABNORMAL HIGH (ref 15–41)
Albumin: 2 g/dL — ABNORMAL LOW (ref 3.5–5.0)
Alkaline Phosphatase: 119 U/L (ref 38–126)
Bilirubin, Direct: 0.5 mg/dL — ABNORMAL HIGH (ref 0.0–0.2)
Indirect Bilirubin: 0.9 mg/dL (ref 0.3–0.9)
Total Bilirubin: 1.4 mg/dL — ABNORMAL HIGH (ref 0.3–1.2)
Total Protein: 4.4 g/dL — ABNORMAL LOW (ref 6.5–8.1)

## 2021-05-21 LAB — GLUCOSE, CAPILLARY
Glucose-Capillary: 179 mg/dL — ABNORMAL HIGH (ref 70–99)
Glucose-Capillary: 181 mg/dL — ABNORMAL HIGH (ref 70–99)
Glucose-Capillary: 156 mg/dL — ABNORMAL HIGH (ref 70–99)
Glucose-Capillary: 112 mg/dL — ABNORMAL HIGH (ref 70–99)
Glucose-Capillary: 166 mg/dL — ABNORMAL HIGH (ref 70–99)

## 2021-05-21 LAB — APTT
aPTT: 166 seconds (ref 24–36)
aPTT: 62 seconds — ABNORMAL HIGH (ref 24–36)

## 2021-05-21 LAB — ECHOCARDIOGRAM LIMITED
Height: 70 in
Weight: 2970.04 oz

## 2021-05-21 LAB — PREPARE RBC (CROSSMATCH)

## 2021-05-21 LAB — FIBRINOGEN: Fibrinogen: 588 mg/dL — ABNORMAL HIGH (ref 210–475)

## 2021-05-21 LAB — LACTATE DEHYDROGENASE: LDH: 1416 U/L — ABNORMAL HIGH (ref 98–192)

## 2021-05-21 LAB — PROCALCITONIN: Procalcitonin: 0.74 ng/mL

## 2021-05-21 LAB — POCT ACTIVATED CLOTTING TIME
Activated Clotting Time: 161 seconds
Activated Clotting Time: 269 seconds

## 2021-05-21 SURGERY — REDO STERNOTOMY
Anesthesia: General | Site: Chest

## 2021-05-21 SURGERY — ECMO CANNULATION
Anesthesia: General | Laterality: Bilateral

## 2021-05-21 MED ORDER — PANTOPRAZOLE SODIUM 40 MG IV SOLR
40.0000 mg | Freq: Every day | INTRAVENOUS | Status: DC
  Administered 2021-05-21 – 2021-05-26 (×6): 40 mg via INTRAVENOUS
  Filled 2021-05-21: qty 10
  Filled 2021-05-21 (×5): qty 40

## 2021-05-21 MED ORDER — ETOMIDATE 2 MG/ML IV SOLN
INTRAVENOUS | Status: AC
  Administered 2021-05-21: 20 mg via INTRAVENOUS
  Filled 2021-05-21: qty 20

## 2021-05-21 MED ORDER — DEXTROSE-NACL 5-0.45 % IV SOLN: INTRAVENOUS | Status: DC

## 2021-05-21 MED ORDER — CHLORHEXIDINE GLUCONATE 0.12% ORAL RINSE (MEDLINE KIT)
15.0000 mL | Freq: Two times a day (BID) | OROMUCOSAL | Status: DC
  Administered 2021-05-21 – 2021-06-17 (×54): 15 mL via OROMUCOSAL

## 2021-05-21 MED ORDER — MIDAZOLAM HCL 2 MG/2ML IJ SOLN
INTRAMUSCULAR | Status: DC | PRN
  Administered 2021-05-21: 2 mg via INTRAVENOUS

## 2021-05-21 MED ORDER — ROCURONIUM BROMIDE 10 MG/ML (PF) SYRINGE
PREFILLED_SYRINGE | INTRAVENOUS | Status: AC
  Administered 2021-05-21: 50 mg
  Filled 2021-05-21: qty 10

## 2021-05-21 MED ORDER — POTASSIUM CHLORIDE 20 MEQ PO PACK
40.0000 meq | PACK | Freq: Once | ORAL | Status: AC
  Administered 2021-05-21: 40 meq via ORAL
  Filled 2021-05-21: qty 2

## 2021-05-21 MED ORDER — ORAL CARE MOUTH RINSE
15.0000 mL | OROMUCOSAL | Status: DC
  Administered 2021-05-21 – 2021-06-17 (×266): 15 mL via OROMUCOSAL

## 2021-05-21 MED ORDER — MIDAZOLAM HCL 2 MG/2ML IJ SOLN
INTRAMUSCULAR | Status: AC
  Filled 2021-05-21: qty 2

## 2021-05-21 MED ORDER — ROCURONIUM BROMIDE 10 MG/ML (PF) SYRINGE: 50.0000 mg | PREFILLED_SYRINGE | Freq: Once | INTRAVENOUS | Status: AC

## 2021-05-21 MED ORDER — FENTANYL CITRATE PF 50 MCG/ML IJ SOSY
PREFILLED_SYRINGE | INTRAMUSCULAR | Status: AC
  Filled 2021-05-21: qty 2

## 2021-05-21 MED ORDER — SODIUM BICARBONATE 8.4 % IV SOLN: 100.0000 meq | Freq: Once | INTRAVENOUS | Status: AC

## 2021-05-21 MED ORDER — MIDAZOLAM HCL 5 MG/5ML IJ SOLN
INTRAMUSCULAR | Status: AC
  Filled 2021-05-21: qty 5

## 2021-05-21 MED ORDER — CHLORHEXIDINE GLUCONATE CLOTH 2 % EX PADS: 6.0000 | MEDICATED_PAD | Freq: Once | CUTANEOUS | Status: AC

## 2021-05-21 MED ORDER — SODIUM CHLORIDE 0.9% IV SOLUTION: Freq: Once | INTRAVENOUS | Status: DC

## 2021-05-21 MED ORDER — DEXTROSE 50 % IV SOLN: 0.0000 mL | INTRAVENOUS | Status: DC | PRN

## 2021-05-21 MED ORDER — CALCIUM CHLORIDE 10 % IV SOLN
INTRAVENOUS | Status: AC
  Administered 2021-05-21: 1 g via INTRAVENOUS
  Filled 2021-05-21: qty 10

## 2021-05-21 MED ORDER — SODIUM CHLORIDE 0.9 % IV SOLN: INTRAVENOUS | Status: DC

## 2021-05-21 MED ORDER — POTASSIUM CHLORIDE 10 MEQ/50ML IV SOLN
10.0000 meq | INTRAVENOUS | Status: AC
  Administered 2021-05-21 (×3): 10 meq via INTRAVENOUS
  Filled 2021-05-21 (×3): qty 50

## 2021-05-21 MED ORDER — SODIUM BICARBONATE 8.4 % IV SOLN
INTRAVENOUS | Status: AC
  Administered 2021-05-21: 100 meq via INTRAVENOUS
  Filled 2021-05-21: qty 100

## 2021-05-21 MED ORDER — FENTANYL CITRATE (PF) 100 MCG/2ML IJ SOLN
INTRAMUSCULAR | Status: DC | PRN
  Administered 2021-05-21: 50 ug via INTRAVENOUS

## 2021-05-21 MED ORDER — INSULIN ASPART 100 UNIT/ML IJ SOLN
0.0000 [IU] | INTRAMUSCULAR | Status: DC
  Administered 2021-05-21 – 2021-05-22 (×3): 3 [IU] via SUBCUTANEOUS
  Administered 2021-05-22: 2 [IU] via SUBCUTANEOUS
  Administered 2021-05-22 – 2021-05-23 (×5): 3 [IU] via SUBCUTANEOUS
  Administered 2021-05-23: 2 [IU] via SUBCUTANEOUS
  Administered 2021-05-23 (×4): 3 [IU] via SUBCUTANEOUS
  Administered 2021-05-24: 2 [IU] via SUBCUTANEOUS
  Administered 2021-05-24: 3 [IU] via SUBCUTANEOUS
  Administered 2021-05-24: 2 [IU] via SUBCUTANEOUS
  Administered 2021-05-24 – 2021-05-25 (×4): 3 [IU] via SUBCUTANEOUS
  Administered 2021-05-25 (×2): 2 [IU] via SUBCUTANEOUS
  Administered 2021-05-25: 3 [IU] via SUBCUTANEOUS
  Administered 2021-05-25: 2 [IU] via SUBCUTANEOUS
  Administered 2021-05-25: 3 [IU] via SUBCUTANEOUS
  Administered 2021-05-26: 2 [IU] via SUBCUTANEOUS
  Administered 2021-05-26 – 2021-05-27 (×11): 3 [IU] via SUBCUTANEOUS
  Administered 2021-05-28: 2 [IU] via SUBCUTANEOUS
  Administered 2021-05-28 – 2021-05-29 (×5): 3 [IU] via SUBCUTANEOUS
  Administered 2021-05-29: 2 [IU] via SUBCUTANEOUS
  Administered 2021-05-29 (×3): 3 [IU] via SUBCUTANEOUS
  Administered 2021-05-29 – 2021-05-30 (×3): 2 [IU] via SUBCUTANEOUS
  Administered 2021-05-30: 3 [IU] via SUBCUTANEOUS
  Administered 2021-05-30 (×4): 2 [IU] via SUBCUTANEOUS
  Administered 2021-05-31: 3 [IU] via SUBCUTANEOUS
  Administered 2021-05-31 (×2): 2 [IU] via SUBCUTANEOUS
  Administered 2021-05-31 (×2): 3 [IU] via SUBCUTANEOUS
  Administered 2021-06-01: 2 [IU] via SUBCUTANEOUS
  Administered 2021-06-01 (×2): 3 [IU] via SUBCUTANEOUS
  Administered 2021-06-01: 2 [IU] via SUBCUTANEOUS
  Administered 2021-06-01: 3 [IU] via SUBCUTANEOUS
  Administered 2021-06-02 (×3): 2 [IU] via SUBCUTANEOUS
  Administered 2021-06-02 (×2): 3 [IU] via SUBCUTANEOUS
  Administered 2021-06-02 – 2021-06-03 (×3): 2 [IU] via SUBCUTANEOUS
  Administered 2021-06-03 – 2021-06-04 (×5): 3 [IU] via SUBCUTANEOUS
  Administered 2021-06-04: 2 [IU] via SUBCUTANEOUS
  Administered 2021-06-04 (×2): 3 [IU] via SUBCUTANEOUS
  Administered 2021-06-04: 2 [IU] via SUBCUTANEOUS
  Administered 2021-06-05: 3 [IU] via SUBCUTANEOUS
  Administered 2021-06-05 (×3): 2 [IU] via SUBCUTANEOUS
  Administered 2021-06-05: 3 [IU] via SUBCUTANEOUS
  Administered 2021-06-05 – 2021-06-06 (×3): 2 [IU] via SUBCUTANEOUS
  Administered 2021-06-06: 3 [IU] via SUBCUTANEOUS
  Administered 2021-06-06 – 2021-06-07 (×8): 2 [IU] via SUBCUTANEOUS
  Administered 2021-06-08: 3 [IU] via SUBCUTANEOUS
  Administered 2021-06-08 (×2): 2 [IU] via SUBCUTANEOUS
  Administered 2021-06-08 – 2021-06-09 (×2): 3 [IU] via SUBCUTANEOUS
  Administered 2021-06-09 (×3): 2 [IU] via SUBCUTANEOUS
  Administered 2021-06-09: 3 [IU] via SUBCUTANEOUS
  Administered 2021-06-09 – 2021-06-10 (×3): 2 [IU] via SUBCUTANEOUS
  Administered 2021-06-10: 3 [IU] via SUBCUTANEOUS
  Administered 2021-06-10: 2 [IU] via SUBCUTANEOUS
  Administered 2021-06-10: 3 [IU] via SUBCUTANEOUS
  Administered 2021-06-10: 2 [IU] via SUBCUTANEOUS
  Administered 2021-06-11 (×2): 3 [IU] via SUBCUTANEOUS
  Administered 2021-06-11 (×2): 2 [IU] via SUBCUTANEOUS
  Administered 2021-06-11: 3 [IU] via SUBCUTANEOUS
  Administered 2021-06-11 (×2): 2 [IU] via SUBCUTANEOUS
  Administered 2021-06-12: 0 [IU] via SUBCUTANEOUS
  Administered 2021-06-12 – 2021-06-13 (×7): 2 [IU] via SUBCUTANEOUS
  Administered 2021-06-13: 3 [IU] via SUBCUTANEOUS
  Administered 2021-06-13 – 2021-06-16 (×8): 2 [IU] via SUBCUTANEOUS
  Administered 2021-06-16: 3 [IU] via SUBCUTANEOUS
  Administered 2021-06-16 – 2021-06-17 (×3): 2 [IU] via SUBCUTANEOUS

## 2021-05-21 MED ORDER — MIDAZOLAM HCL 2 MG/2ML IJ SOLN
INTRAMUSCULAR | Status: AC
  Administered 2021-05-21: 2 mg
  Filled 2021-05-21: qty 2

## 2021-05-21 MED ORDER — INSULIN REGULAR(HUMAN) IN NACL 100-0.9 UT/100ML-% IV SOLN: INTRAVENOUS | Status: DC

## 2021-05-21 MED ORDER — STERILE WATER FOR INJECTION IJ SOLN
50.0000 ng/kg/min | INTRAVENOUS | Status: DC
  Administered 2021-05-21: 50 ng/kg/min via RESPIRATORY_TRACT
  Filled 2021-05-21 (×3): qty 5

## 2021-05-21 MED ORDER — FENTANYL CITRATE PF 50 MCG/ML IJ SOSY
25.0000 ug | PREFILLED_SYRINGE | INTRAMUSCULAR | Status: DC | PRN
  Administered 2021-05-21 (×2): 50 ug via INTRAVENOUS
  Administered 2021-05-22: 25 ug via INTRAVENOUS
  Administered 2021-05-22: 100 ug via INTRAVENOUS
  Administered 2021-05-22 – 2021-05-30 (×17): 50 ug via INTRAVENOUS
  Administered 2021-05-30 – 2021-05-31 (×2): 100 ug via INTRAVENOUS
  Administered 2021-05-31 – 2021-06-01 (×4): 50 ug via INTRAVENOUS
  Administered 2021-06-01: 100 ug via INTRAVENOUS
  Administered 2021-06-02 (×2): 50 ug via INTRAVENOUS
  Administered 2021-06-02: 100 ug via INTRAVENOUS
  Administered 2021-06-02: 50 ug via INTRAVENOUS
  Administered 2021-06-02 (×3): 100 ug via INTRAVENOUS
  Filled 2021-05-21: qty 1
  Filled 2021-05-21 (×3): qty 2
  Filled 2021-05-21: qty 1
  Filled 2021-05-21 (×2): qty 2
  Filled 2021-05-21: qty 1
  Filled 2021-05-21: qty 2
  Filled 2021-05-21 (×3): qty 1
  Filled 2021-05-21 (×4): qty 2
  Filled 2021-05-21 (×4): qty 1
  Filled 2021-05-21 (×4): qty 2

## 2021-05-21 MED ORDER — ALBUMIN HUMAN 5 % IV SOLN
12.5000 g | INTRAVENOUS | Status: DC | PRN
  Administered 2021-05-30 – 2021-06-16 (×14): 12.5 g via INTRAVENOUS
  Filled 2021-05-21 (×13): qty 250

## 2021-05-21 MED ORDER — STERILE WATER FOR INJECTION IV SOLN
INTRAVENOUS | Status: DC
  Filled 2021-05-21 (×2): qty 1000

## 2021-05-21 MED ORDER — CALCIUM CHLORIDE 10 % IV SOLN
1.0000 g | Freq: Once | INTRAVENOUS | Status: AC
Start: 2021-05-21 — End: 2021-05-21

## 2021-05-21 MED ORDER — FENTANYL CITRATE (PF) 100 MCG/2ML IJ SOLN
INTRAMUSCULAR | Status: AC
  Filled 2021-05-21: qty 2

## 2021-05-21 MED ORDER — DOCUSATE SODIUM 50 MG/5ML PO LIQD
100.0000 mg | Freq: Two times a day (BID) | ORAL | Status: DC
  Administered 2021-05-21 – 2021-05-29 (×9): 100 mg
  Filled 2021-05-21 (×10): qty 10

## 2021-05-21 MED ORDER — FENTANYL CITRATE PF 50 MCG/ML IJ SOSY
25.0000 ug | PREFILLED_SYRINGE | INTRAMUSCULAR | Status: DC | PRN
  Filled 2021-05-21: qty 1

## 2021-05-21 MED ORDER — ROCURONIUM BROMIDE 10 MG/ML (PF) SYRINGE
PREFILLED_SYRINGE | INTRAVENOUS | Status: AC
  Administered 2021-05-21: 50 mg via INTRAVENOUS
  Filled 2021-05-21: qty 10

## 2021-05-21 MED ORDER — ROCURONIUM BROMIDE 100 MG/10ML IV SOLN
INTRAVENOUS | Status: DC | PRN
Start: 2021-05-21 — End: 2021-05-21
  Administered 2021-05-21: 50 mg via INTRAVENOUS

## 2021-05-21 MED ORDER — SODIUM CHLORIDE 0.9 % IV SOLN
1.0000 g | Freq: Three times a day (TID) | INTRAVENOUS | Status: DC
  Filled 2021-05-21 (×3): qty 1

## 2021-05-21 MED ORDER — SODIUM CHLORIDE 0.9 % IV SOLN
0.0380 mg/kg/h | INTRAVENOUS | Status: DC
  Administered 2021-05-21 – 2021-05-24 (×2): 0.03 mg/kg/h via INTRAVENOUS
  Filled 2021-05-21 (×3): qty 250

## 2021-05-21 MED ORDER — DEXMEDETOMIDINE HCL IN NACL 400 MCG/100ML IV SOLN
0.0000 ug/kg/h | INTRAVENOUS | Status: AC
  Administered 2021-05-21: 0.8 ug/kg/h via INTRAVENOUS
  Administered 2021-05-22: 1.2 ug/kg/h via INTRAVENOUS
  Administered 2021-05-22: 1.1 ug/kg/h via INTRAVENOUS
  Administered 2021-05-22: 0.8 ug/kg/h via INTRAVENOUS
  Administered 2021-05-22: 1.2 ug/kg/h via INTRAVENOUS
  Administered 2021-05-22: 0.8 ug/kg/h via INTRAVENOUS
  Administered 2021-05-23: 0.5 ug/kg/h via INTRAVENOUS
  Administered 2021-05-23: 0.8 ug/kg/h via INTRAVENOUS
  Administered 2021-05-23: 0.6 ug/kg/h via INTRAVENOUS
  Administered 2021-05-24: 0.7 ug/kg/h via INTRAVENOUS
  Filled 2021-05-21: qty 200
  Filled 2021-05-21 (×4): qty 100
  Filled 2021-05-21: qty 300
  Filled 2021-05-21 (×4): qty 100

## 2021-05-21 MED ORDER — CHLORHEXIDINE GLUCONATE CLOTH 2 % EX PADS
6.0000 | MEDICATED_PAD | Freq: Every day | CUTANEOUS | Status: DC
  Administered 2021-05-22: 6 via TOPICAL

## 2021-05-21 MED ORDER — NOREPINEPHRINE 16 MG/250ML-% IV SOLN
0.0000 ug/min | INTRAVENOUS | Status: DC
  Administered 2021-05-21: 70 ug/min via INTRAVENOUS
  Administered 2021-05-22: 6 ug/min via INTRAVENOUS
  Administered 2021-05-25: 3 ug/min via INTRAVENOUS
  Filled 2021-05-21 (×5): qty 250

## 2021-05-21 MED ORDER — ETOMIDATE 2 MG/ML IV SOLN: 20.0000 mg | Freq: Once | INTRAVENOUS | Status: AC

## 2021-05-21 MED ORDER — HYDROXYZINE HCL 50 MG/ML IM SOLN
25.0000 mg | Freq: Four times a day (QID) | INTRAMUSCULAR | Status: DC | PRN
  Filled 2021-05-21: qty 0.5

## 2021-05-21 MED ORDER — HEPARIN SODIUM (PORCINE) 1000 UNIT/ML IJ SOLN
INTRAMUSCULAR | Status: DC | PRN
  Administered 2021-05-21: 7000 [IU] via INTRAVENOUS

## 2021-05-21 MED ORDER — LACTATED RINGERS IV SOLN: INTRAVENOUS | Status: DC | PRN

## 2021-05-21 MED ORDER — SODIUM CHLORIDE 0.9 % IV SOLN
1.0000 g | Freq: Three times a day (TID) | INTRAVENOUS | Status: AC
  Administered 2021-05-21 – 2021-05-28 (×21): 1 g via INTRAVENOUS
  Filled 2021-05-21 (×21): qty 1

## 2021-05-21 MED ORDER — FUROSEMIDE 10 MG/ML IJ SOLN
80.0000 mg | Freq: Two times a day (BID) | INTRAMUSCULAR | Status: AC
  Administered 2021-05-21: 80 mg via INTRAVENOUS
  Filled 2021-05-21 (×2): qty 8

## 2021-05-21 MED ORDER — ALBUMIN HUMAN 5 % IV SOLN: INTRAVENOUS | Status: DC | PRN

## 2021-05-21 MED ORDER — POLYETHYLENE GLYCOL 3350 17 G PO PACK
17.0000 g | PACK | Freq: Every day | ORAL | Status: DC
  Administered 2021-05-21: 17 g
  Filled 2021-05-21: qty 1

## 2021-05-21 MED ORDER — HEPARIN (PORCINE) IN NACL 1000-0.9 UT/500ML-% IV SOLN
INTRAVENOUS | Status: DC | PRN
  Administered 2021-05-21: 500 mL

## 2021-05-21 SURGICAL SUPPLY — 6 items
CATH DUAL LUMEN CRESCENT 32FR (CATHETERS) ×1 IMPLANT
ELECT DEFIB PAD ADLT CADENCE (PAD) ×1 IMPLANT
KIT VASCULAR ACCESS OPUS (MISCELLANEOUS) ×1 IMPLANT
PACK CARDIAC CATHETERIZATION (CUSTOM PROCEDURE TRAY) ×1 IMPLANT
PROTECTION STATION PRESSURIZED (MISCELLANEOUS) ×2
STATION PROTECTION PRESSURIZED (MISCELLANEOUS) IMPLANT

## 2021-05-21 NOTE — Progress Notes (Signed)
Chaplain Maggie made initial visit with family in waiting area outside of [place]. Pt was preparing to enter procedure in cath lab. The family was open to receive care from [place]. Storytelling and prayer were central to this visit. Continued support is welcome as needed.

## 2021-05-21 NOTE — Transfer of Care (Addendum)
Immediate Anesthesia Transfer of Care Note  Patient: Randall Hodges  Procedure(s) Performed: ECMO CANNULATION (Bilateral)  Patient Location: PACU  Anesthesia Type:MAC   Level of Consciousness: sedated and patient cooperative  Airway & Oxygen Therapy: Patient remains intubated per anesthesia plan and Patient placed on Ventilator (see vital sign flow sheet for setting)  Post-op Assessment: Report given to RN and Post -op Vital signs reviewed and stable  Post vital signs: Reviewed and stable  Last Vitals:  Vitals Value Taken Time  BP 113/72 05/30/2021 1706  Temp    Pulse 73 06/09/2021 1712  Resp 9 05/20/2021 1712  SpO2 100 % 06/03/2021 1712  Vitals shown include unvalidated device data.  Last Pain:  Vitals:   05/31/2021 0800  TempSrc: Axillary  PainSc:       Patients Stated Pain Goal: 0 (28/41/32 4401)  Complications: There were no known notable events for this encounter.

## 2021-05-21 NOTE — Progress Notes (Signed)
Patient transported from Cath lab to [facility] on ECMO. Transported on vent with RT, ECMO placed on battery power and O2 tank by this specialist and ECMO coordinator Idelia Salm.  On arrival to room cardiohelp put back to wall power and wall O2.  Emergency supplies present; report given to Robbie Lis ECMO specialist and Scheryl Darter RN.

## 2021-05-21 NOTE — Telephone Encounter (Signed)
Per Dr. Libby Maw request, pt has been scheduled for hosp f/u on 06/24/21 @ 1110am. Appt reminder has been mailed. Appt will reflect on AVS upon hosp discharge for pt future reference.

## 2021-05-21 NOTE — Progress Notes (Signed)
Patient was intubated, placed on mechanical ventilation.  Bronchoscopy was done, see my separate note, BAL was performed in right lower lobe and left lower lobe, sample was sent to the lab for culture and sensitivities.  X-ray chest was done which showed no pneumothorax, ET tube in place with slightly better bilateral infiltrates compared to this morning  1 hour postintubation and ABG was performed which showed pH of 6.99, PCO2 >110 and PaO2 >100 100%/16/30/420 and patient was getting hypotensive  Vent setting was adjusted to 90%/16/34/430.  Patient was given 2 A of bicarbonate and 1 g of calcium Continue IV vasopressors Spoke with heart failure team, it is okay to prone the patient We will prone the patient, repeat ABGs in 1 hour as patient has not been ventilating well.  Patient was proned, will repeat ABGs in an hour, if no improvement in PCO2/gas exchange, will ask ECMO consult  Additional critical care time: 31 minutes  Performed by: [place] care time was exclusive of separately billable procedures and treating other patients.   Critical care was necessary to treat or prevent imminent or life-threatening deterioration.   Critical care was time spent personally by me on the following activities: development of treatment plan with patient and/or surrogate as well as nursing, discussions with consultants, evaluation of patient's response to treatment, examination of patient, obtaining history from patient or surrogate, ordering and performing treatments and interventions, ordering and review of laboratory studies, ordering and review of radiographic studies, pulse oximetry and re-evaluation of patient's condition.   Jacky Kindle MD [facility] See Amion for pager If no response to pager, please call [phone] until 7pm After 7pm, Please call E-link [phone]

## 2021-05-21 NOTE — Procedures (Signed)
Central Venous Catheter Insertion Procedure Note  Randall Hodges  [id number]  March 25, 1951  Date:05/25/2021  Time:2:20 PM   Provider Performing:Cherre Kothari   Procedure: Insertion of Non-tunneled Central Venous [phone]) with US guidance ([id number])   Indication(s) Medication administration  Consent Risks of the procedure as well as the alternatives and risks of each were explained to the patient and/or caregiver.  Consent for the procedure was obtained and is signed in the bedside chart  Anesthesia Topical only with 1% lidocaine   Timeout Verified patient identification, verified procedure, site/side was marked, verified correct patient position, special equipment/implants available, medications/allergies/relevant history reviewed, required imaging and test results available.  Sterile Technique Maximal sterile technique including full sterile barrier drape, hand hygiene, sterile gown, sterile gloves, mask, hair covering, sterile ultrasound probe cover (if used).  Procedure Description Area of catheter insertion was cleaned with chlorhexidine and draped in sterile fashion.  With real-time ultrasound guidance a introducer sheath was placed into the right internal jugular vein. Nonpulsatile blood flow and easy flushing noted in all ports.  The catheter was sutured in place and sterile dressing applied.  Complications/Tolerance None; patient tolerated the procedure well. Chest X-ray is ordered to verify placement for internal jugular or subclavian cannulation.   Chest x-ray is not ordered for femoral cannulation.  EBL Minimal  Specimen(s) None

## 2021-05-21 NOTE — Progress Notes (Signed)
PT Cancellation Note  Patient Details Name: Randall Hodges MRN: [id number] DOB: 1950-08-14   Cancelled Treatment:    Reason Eval/Treat Not Completed: Patient not medically ready. Pt re-intubated this morning, RN requesting hold on PT at this time. Will plan to follow-up another day as appropriate.   Moishe Spice, PT, DPT Acute Rehabilitation Services  Pager: [phone] Office: [place] 05/28/2021, 10:02 AM

## 2021-05-21 NOTE — Progress Notes (Signed)
Pt cannulated succesfully in Cath Lab 6 and transported to [place] on VV ECMO flowing 3.67Lpm. Uneventful transport. HLS functionally properly and pt in stable condition.

## 2021-05-21 NOTE — Progress Notes (Addendum)
Patient ID: KEMONI ORTEGA, male   DOB: [birth year], 71 y.o.   MRN: [id number]    Progress Note  Patient Name: Randall Hodges Date of Encounter: 05/31/2021  [facility] Cardiologist: [name], MD   Subjective   CXR with persistent bilateral lung infiltrates, no significant improvement compared to yesterday. He remains on Bipap, resp rate elevated at 34 breath/min this morning.  He is on Zosyn + Vancomycin in setting of aspiration PNA. WBCs 24. Afebrile.   He is now on Precedex, started back on pressors yesterday, now on NE 4 and vasopressin 0.03.  CVP 13, I/Os positive.  Co-ox 63%.   He is in NSR on amiodarone gtt.  Only on prophylactic Lovenox.   Hiccupping has resolved.  Has CorTrak tube feeds.   Cardiac Studies: Echo (limited, 1/30): Echo reviewed, there is clear respirophasic variation of the interventricular septum and marked respirophasic variation of E inflow velocity on doppler evaluation of the mitral valve.  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  LV EF 45-50%.   RHC Procedural Findings (on norepinephrine 6): Hemodynamics (mmHg) RA mean 12 RV 37/12 PA 38/16, mean 27 PCWP mean 11 LV 108/12 AO 96/55 PAPI 1.8 Oxygen saturations: PA 54% AO 94% Cardiac Output (Fick) 5.59  Cardiac Index (Fick) 2.81  PVR 2.8 WU Simultaneous RV/LV tracings were obtained.  Difficult to interpret due to atrial fibrillation.  There was some suggestion of discordance (ventricular interdependence) but not clear.  Inpatient Medications    Scheduled Meds:  aspirin  81 mg Per Tube Daily   chlorhexidine  15 mL Mouth Rinse BID   Chlorhexidine Gluconate Cloth  6 each Topical Daily   colchicine  0.6 mg Per Tube Daily   enoxaparin (LOVENOX) injection  40 mg Subcutaneous Q24H   furosemide  80 mg  Intravenous BID   guaiFENesin  30 mL Per Tube QID   insulin aspart  0-15 Units Subcutaneous Q4H   mouth rinse  15 mL Mouth Rinse q12n4p   metoCLOPramide (REGLAN) injection  10 mg Intravenous Q6H   pantoprazole sodium  40 mg Per Tube Daily   potassium chloride  40 mEq Per Tube Q6H   rosuvastatin  10 mg Per Tube Daily   sodium chloride flush  10-40 mL Intracatheter Q12H   sodium chloride flush  3 mL Intravenous Q12H   sodium chloride flush  3 mL Intravenous Q12H   Continuous Infusions:  sodium chloride Stopped (04/30/2021 1034)   sodium chloride     sodium chloride     amiodarone 30 mg/hr (05/20/2021 0400)   dexmedetomidine (PRECEDEX) IV infusion 0.8 mcg/kg/hr (05/26/2021 0400)   feeding supplement (VITAL 1.5 CAL) 60 mL/hr at 05/25/2021 0628   norepinephrine (LEVOPHED) Adult infusion 4 mcg/min (06/03/2021 0400)   piperacillin-tazobactam (ZOSYN)  IV 12.5 mL/hr at 05/25/2021 0400   potassium chloride     vancomycin Stopped (05/20/21 2302)   vasopressin 0.03 Units/min (06/02/2021 0400)   PRN Meds: Place/Maintain arterial line **AND** sodium chloride, sodium chloride, acetaminophen (TYLENOL) oral liquid 160 mg/5 mL, diazepam, hydrOXYzine, levalbuterol, morphine injection, ondansetron (ZOFRAN) IV, sodium chloride flush, sodium chloride flush, traMADol   Vital Signs    Vitals:   05/20/2021 0530 06/12/2021 0545 05/20/2021 0600 06/15/2021 0700  BP:    (!) 176/74  Pulse: 83 79 77 89  Resp: (!) 39 (!) 34 (!) 31 (!) 46  Temp:  TempSrc:      SpO2: 97% 98% 98% (!) 86%  Weight:      Height:        Intake/Output Summary (Last 24 hours) at 06/02/2021 0747 Last data filed at 05/29/2021 5956 Gross per 24 hour  Intake 2921.5 ml  Output 1800 ml  Net 1121.5 ml   Last 3 Weights 05/20/2021 05/19/2021 05/17/2021  Weight (lbs) 185 lb 10 oz 190 lb 14.7 oz 178 lb 12.7 oz  Weight (kg) 84.2 kg 86.6 kg 81.1 kg      Telemetry    NSR 70s (personally reviewed)  Physical Exam  CVP 13 General: On Bipap,  tachypneic Neck: JVP 12 cm, no thyromegaly or thyroid nodule.  Lungs: Bilateral crackles. CV: Nondisplaced PMI.  Heart regular S1/S2, no S3/S4, no murmur.  Trace ankle edema.  Abdomen: Soft, nontender, no hepatosplenomegaly, no distention.  Skin: Intact without lesions or rashes.  Neurologic: Sedated on Precedex.  Psych: Normal affect. Extremities: No clubbing or cyanosis.  HEENT: Normal.    Labs    High Sensitivity Troponin:   Recent Labs  Lab 05/04/2021 1321 05/13/2021 1518  TROPONINIHS 3 4     Chemistry Recent Labs  Lab 05/15/21 0117 05/17/21 0413 05/17/21 1733 05/17/21 1818 05/19/21 0209 05/19/21 0856 05/20/21 0342 05/20/21 0746 05/20/21 1719 05/29/2021 0314  NA 128*   < > 127*   < > 130*   < > 134*  --  132* 133*   133*  K 3.8   < > 3.5   < > 3.8   < > 3.5  --  3.5 3.8   3.9  CL 86*   < > 85*   < > 90*  --  89*  --   --  93*  CO2 33*   < > 32   < > 30  --  33*  --   --  30  GLUCOSE 135*   < > 126*   < > 182*  --  134*  --   --  177*  BUN 18   < > 34*   < > 33*  --  28*  --   --  31*  CREATININE 1.16   < > 2.46*   < > 1.52*  --  1.38*  --   --  1.37*  CALCIUM 8.2*   < > 7.6*   < > 8.2*  --  8.4*  --   --  8.1*  MG 1.8  --  1.9  --   --   --   --  1.9  --   --   GFRNONAA >60   < > 27*   < > 49*  --  55*  --   --  55*  ANIONGAP 9   < > 10   < > 10  --  12  --   --  10   < > = values in this interval not displayed.    Lipids  No results for input(s): CHOL, TRIG, HDL, LABVLDL, LDLCALC, CHOLHDL in the last 168 hours.   Hematology Recent Labs  Lab 05/19/21 0209 05/19/21 0856 05/20/21 0342 05/20/21 1719 06/10/2021 0314  WBC 18.0*  --  21.5*  --  24.2*  RBC 2.87*  --  3.04*  --  2.87*  HGB 8.4*   < > 9.2* 9.2* 9.9*   8.6*  HCT 25.5*   < > 26.7* 27.0* 29.0*   25.6*  MCV 88.9  --  87.8  --  89.2  MCH 29.3  --  30.3  --  30.0  MCHC 32.9  --  34.5  --  33.6  RDW 13.8  --  13.9  --  14.4  PLT 307  --  287  --  269   < > = values in this interval not displayed.    Thyroid No results for input(s): TSH, FREET4 in the last 168 hours.  BNPNo results for input(s): BNP, PROBNP in the last 168 hours.  DDimer No results for input(s): DDIMER in the last 168 hours.   Radiology    DG CHEST PORT 1 VIEW  Result Date: 05/20/2021 CLINICAL DATA:  71 year old male with shortness of breath. Status post CABG on 05/16/2021. EXAM: PORTABLE CHEST 1 VIEW COMPARISON:  Portable chest 05/19/2021 and earlier. FINDINGS: Portable AP upright view at 0522 hours. Enteric feeding tube and right PICC line remain in place. Prior CABG. Stable cardiac size and mediastinal contours. Diffuse, coarse bilateral pulmonary interstitial opacity although regressed confluent upper lobe predominant airspace opacity since yesterday. Mildly displaced posterior right 2nd rib fracture redemonstrated. No pneumothorax. No definite pleural effusion. Paucity of bowel gas. IMPRESSION: 1. Improved bilateral ventilation since yesterday although extensive residual coarse pulmonary interstitial opacity, indeterminate for edema versus infection. 2. Stable lines and tubes.  No new cardiopulmonary abnormality. Electronically Signed   By: Genevie Ann M.D.   On: 05/20/2021 06:36    Cardiac Studies   Cath 04/26/2021 Distal left main [place] 111 bifurcation stenosis with 75% left main, 90% ostial to proximal LAD, and 80-90% ostial circumflex (difficult to assess due to heavy calcification). Severe mid circumflex disease with 70% eccentric mid stenosis and second obtuse marginal containing ostial to proximal greater than 80% stenosis.  (Bifurcation [place] 111 Severe calcification in left main and LAD in particular with diffuse 50% narrowing from proximal to mid vessel and tandem 70% stenoses in the mid LAD. Nondominant right coronary Normal LV function.  EF 55%.  LVEDP normal.    Patient Profile     71 y.o. male with PMH of PVCs presented with chest pain. Cardiac cath by Dr. Tamala Julian on 05/17/2021 showed 75% left main, 90% ost  to prox LAD, 80-90% ost LCx, 70% mid LCx, 80% OM2, 50% prox to mid LAD, 70% mid LAD, EF 55%. Patient underwent CABG x 3 on 04/28/2021. Post op course complicated afib, treated with amio. CXR showed opacity in bilateral lung, started abx on 1/25 and diuretic. Started on Eliquis due to recurrence of afib.    Assessment & Plan    1. CAD: Admitted with unstable angina, cath with severe left main and proximal LAD/LCx disease (nondominant RCA).  CABG x 3 on 1/18 with LIMA-LAD, SVG-OM, SVG-left PDA.   - Continue ASA 81, statin.  2. 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 RHC 1/30 with equalization of diastolic pressures.  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  He has had mild pleuritic chest pain.  CVP 13 this morning with I/Os positive, co-ox 63%.  - Lasix 80 mg IV bid today, replace K. - With concern for development of post-surgical effusive/constrictive pericarditis, he is on colchicine. ESR 55. - ?need for surgical intervention on effusive/constrictive pericarditis. Will  need improvement of lung disease first. Repeat echo today to reassess pericardial disease.  3. 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  Probably primarily septic/vasodilatory shock. Precedex started yesterday with fall in BP requiring NE 4 + vasopressin 0.03.   - Continue NE and vasopressin, wean as able. - Send lactate.  4. Hiccups: Had been intractable, now seem resolved.  5. PNA: CXR with persistent bilateral infiltrates, no better compared to yesterday. Remains on Bipap,  now tachypneic.  Have thought most likely aspiration PNA/pneumonitis in setting of intractable hiccups. Worried that he has developed ARDS picture.  Afebrile.  WBC trending up, 18>21.5>24.  COVID was negative.  - Discussed with pulmonary, think amiodarone toxicity unlikely as he has only been on it post-op.  - Transition abx to meropenem/vancomycin.  - Check PCT and lactate.  - Think he needs intubation today => discussed with CCM, will intubate and do bronchoscopy.  6. Atypical atrial flutter/PVCs:  NSR now on amiodarone.  - Continue amiodarone 30 mg/hr. - Full anticoagulation has been held for now with effusive/constrictive pericarditis picture. He is on Lovenox prophylaxis.  7. AKI: Creatinine up to 2.47 after aggressive diuresis and drop in BP.  Creatinine now down to 1.37 with CVP 13. - Diuresis as above  CRITICAL CARE Performed by: Loralie Champagne  Total critical care time: 40 minutes  Critical care time was exclusive of separately billable procedures and treating other patients.  Critical care was necessary to treat or prevent imminent or life-threatening deterioration.  Critical care was time spent personally by me on the following activities: development of treatment plan with patient and/or surrogate as well as nursing, discussions with consultants, evaluation of patient's response to treatment, examination of patient, obtaining history from patient or surrogate, ordering and performing treatments and interventions, ordering and review of laboratory studies, ordering and review of radiographic studies, pulse oximetry and re-evaluation of patient's condition.  Loralie Champagne 06/08/2021 7:47 AM

## 2021-05-21 NOTE — Procedures (Signed)
Intubation Procedure Note  DARELL SAPUTO  [id number]  21-Aug-1950  Date:05/26/2021  Time:10:16 AM   Provider Performing:Radwan Cowley    Procedure: Intubation (31500)  Indication(s) Respiratory Failure  Consent Risks of the procedure as well as the alternatives and risks of each were explained to the patient and/or caregiver.  Consent for the procedure was obtained and is signed in the bedside chart   Anesthesia Etomidate and Rocuronium   Time Out Verified patient identification, verified procedure, site/side was marked, verified correct patient position, special equipment/implants available, medications/allergies/relevant history reviewed, required imaging and test results available.   Sterile Technique Usual hand hygeine, masks, and gloves were used   Procedure Description Patient positioned in bed supine.  Sedation given as noted above.  Patient was intubated with endotracheal tube using  DL .  View was Grade 1 full glottis .  Number of attempts was 1.  Colorimetric CO2 detector was consistent with tracheal placement.   Complications/Tolerance None; patient tolerated the procedure well. Chest X-ray is ordered to verify placement.   EBL Minimal   Specimen(s) None

## 2021-05-21 NOTE — Progress Notes (Signed)
Patient was transported from the [facility] lab with CATH lab team, ECMO team & 2 RT's without any complications.

## 2021-05-21 NOTE — Telephone Encounter (Signed)
-----   Message from Sharyn Creamer, MD sent at 04/24/2021  5:43 PM EST ----- Hi Geet Hosking, could we arrange for follow up with an APP or Dr. Tarri Glenn in 1 month for hiccups after CABG?  Thanks, Lyndee Leo

## 2021-05-21 NOTE — Progress Notes (Signed)
NAME:  Randall Hodges, MRN:  [id number], DOB:  12/16/1950, LOS: 33 ADMISSION DATE:  05/01/2021, CONSULTATION DATE: 04/19/2021 REFERRING MD:  Dahlia Byes, MD, CHIEF COMPLAINT: Increasing shortness of breath  History of Present Illness:  71 year old male with coronary artery disease who initially presented with chest pain, noted to have multivessel coronary artery disease, he underwent CABG x3 on 04/1818 23, course was complicated with atrial fibrillation, frequent hiccups and aspiration pneumonia leading to hypoxia and increasing oxygen requirement. PCCM was consulted for evaluation and help with management  Patient stated hiccups are better but continued complain of shortness of breath, cough unable to bring up phlegm.  Denies chest pain or palpitation During my evaluation patient is on high flow nasal cannula oxygen at 12 L and he is requiring low-dose Levophed to maintain MAP above 65.  Of note patient had echocardiogram repeated today, consistent with constrictive picture.  Pertinent  Medical History   Past Medical History:  Diagnosis Date   Allergic rhinitis    Prostate cancer ([place])      Significant Hospital Events: Including procedures, antibiotic start and stop dates in addition to other pertinent events     Interim History / Subjective:  Overnight patient had increased work of breathing, became hypoxic and tachypneic, despite being on BiPAP for over 48 hours Getting lethargic and tired   Remained on vasopressors with Levophed and vasopressin  X-ray chest looked worse this morning  CVP was 13   Objective   Blood pressure (!) 130/36, pulse 69, temperature 98.5 F (36.9 C), temperature source Axillary, resp. rate (!) 30, height _0  (1.778 m), weight 84.2 kg, SpO2 98 %. CVP:  [8 mmHg-13 mmHg] 13 mmHg  Vent Mode: PRVC FiO2 (%):  [60 %-100 %] 100 % Set Rate:  [30 bmp] 30 bmp Vt Set:  [420 mL] 420 mL PEEP:  [16 cmH20] 16 cmH20 Plateau Pressure:  [45 cmH20] 45 cmH20    Intake/Output Summary (Last 24 hours) at 06/15/2021 1021 Last data filed at 06/14/2021 6301 Gross per 24 hour  Intake 2617.23 ml  Output 1300 ml  Net 1317.23 ml   Filed Weights   05/17/21 0321 05/19/21 0500 05/20/21 0500  Weight: 81.1 kg 86.6 kg 84.2 kg    Examination: Physical exam: General: Critically ill-appearing male, lying on the bed.  On BiPAP looks in respiratory distress HEENT: [place]/AT, eyes anicteric.  moist mucus membranes Neuro: Lethargic, opens eyes with vocal stimuli, moving all 4 extremities Chest: Bilateral basal crackles all over, no wheezes or rhonchi Heart: Regular rate and rhythm, multiple PVCs, no murmurs or gallops.  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 He will need stripping of pericardium once respiratory status improves GI is following for hiccups, continue Valium and Reglan, better with meds Patient continued remain short of breath, tachypneic and hypoxic, FiO2 was increased to 100% on BiPAP without much improvement in work of breathing Remained on Precedex infusion  X-ray chest showed worsening bilateral infiltrate CVP remain 13 Spoke with TCTS,  heart failure team and patient's wife, explained that his respiratory congestion is not improving, we will proceed with endotracheal intubation and bronchoscopy with BAL Continue Lasix Follow-up respiratory  cultures Continue vasopressors with map goal 65 Continue IV vancomycin Zosyn was switched to IV meropenem Patient converted to sinus rhythm Continue IV amiodarone Patient with multiple PVCs Monitor and supplement electrolytes Serum creatinine remained stable at 1.3 Monitor intake and output Avoid nephrotoxic agent Continue to closely monitor electrolytes, serum sodium remains at 133   Best Practice (right click and "Reselect all SmartList Selections" daily)   Diet/type: NPO start trickle tube feeds DVT prophylaxis: Lovenox GI prophylaxis: PPI Lines: N/A Foley:  N/A Code Status:  full code Last date of multidisciplinary goals of care discussion [2/2: After discussion with heart failure team and TCTS I spoke with patient's wife, explained that his respiratory condition is getting worse, he will need endotracheal intubation and bronchoscopy, patient's wife requested continue full scope of care while keeping him full code.]  Labs   CBC: Recent Labs  Lab 05/15/21 0117 05/17/21 1619 04/29/2021 0314 05/13/2021 1629 05/19/21 0209 05/19/21 0856 05/19/21 1638 05/20/21 0232 05/20/21 0342 05/20/21 1719 05/20/2021 0314  WBC 13.0*  --  19.4*  --  18.0*  --   --   --  21.5*  --  24.2*  HGB 9.6*   < > 8.7*   < > 8.4*   < > 9.5* 9.5* 9.2* 9.2* 9.9*   8.6*  HCT 27.9*   < > 26.1*   < > 25.5*   < > 28.0* 28.0* 26.7* 27.0* 29.0*   25.6*  MCV 88.0  --  89.4  --  88.9  --   --   --  87.8  --  89.2  PLT 218  --  301  --  307  --   --   --  287  --  269   < > = values in this interval not displayed.    Basic Metabolic Panel: Recent Labs  Lab 05/15/21 0117 05/17/21 0413 05/17/21 1733 05/17/21 1818 05/19/2021 0314 05/03/2021 1629 05/19/21 0209 05/19/21 0856 05/19/21 1638 05/20/21 0232 05/20/21 0342 05/20/21 0746 05/20/21 1719 06/02/2021 0314  NA 128*   < > 127*   < > 127*   < > 130*   < > 131* 131* 134*  --  132* 133*   133*  K 3.8   < > 3.5   < > 3.6   < > 3.8   < > 3.7 3.4* 3.5  --  3.5  3.8   3.9  CL 86*   < > 85*  --  84*  --  90*  --   --   --  89*  --   --  93*  CO2 33*   < > 32  --  30  --  30  --   --   --  33*  --   --  30  GLUCOSE 135*   < > 126*  --  206*  --  182*  --   --   --  134*  --   --  177*  BUN 18   < > 34*  --  36*  --  33*  --   --   --  28*  --   --  31*  CREATININE 1.16   < > 2.46*  --  2.11*  --  1.52*  --   --   --  1.38*  --   --  1.37*  CALCIUM 8.2*   < > 7.6*  --  7.9*  --  8.2*  --   --   --  8.4*  --   --  8.1*  MG 1.8  --  1.9  --   --   --   --   --   --   --   --  1.9  --   --    < > = values in this interval not displayed.   GFR: Estimated Creatinine Clearance: 51.1 mL/min (A) (by C-G formula based on SCr of 1.37 mg/dL (H)). Recent Labs  Lab 05/16/2021 0314 05/17/2021 0910 05/19/21 0209 05/20/21 0342 06/07/2021 0314 06/06/2021 0746  PROCALCITON  --  0.33  --   --   --  0.74  WBC 19.4*  --  18.0* 21.5* 24.2*  --   LATICACIDVEN  --   --   --   --   --  1.9    Liver Function Tests: No results for input(s): AST, ALT, ALKPHOS, BILITOT, PROT, ALBUMIN in the last 168 hours. No results for input(s): LIPASE, AMYLASE in the last 168 hours. No results for input(s): AMMONIA in the last 168 hours.  ABG    Component Value Date/Time   PHART 7.423 06/12/2021 0314   PCO2ART 48.8 (H) 05/25/2021 0314   PO2ART 60 (L) 05/23/2021 0314   HCO3 32.0 (H) 06/13/2021 0314   TCO2 34 (H) 06/04/2021 0314   ACIDBASEDEF PENDING 05/20/2021 0232   O2SAT 62.9 05/31/2021 0343     Coagulation Profile: No results for input(s): INR, PROTIME in the last 168 hours.  Cardiac Enzymes: No results for input(s): CKTOTAL, CKMB, CKMBINDEX, TROPONINI in the last 168 hours.  HbA1C: Hgb A1c MFr Bld  Date/Time Value Ref Range Status  05/09/2021 09:26 AM 6.1 (H) 4.8 - 5.6 % Final    Comment:    (NOTE)         Prediabetes: 5.7 - 6.4         Diabetes: >6.4         Glycemic control for adults with diabetes: <7.0     CBG: Recent Labs  Lab 05/20/21 1621 05/20/21 1930  05/20/21 2325 06/13/2021 0311 05/25/2021 0722  GLUCAP 200* 193* 150* 179* 181*    Critical care time:     Total critical care time: 49 minutes  Performed by: Otho care time was exclusive of separately billable procedures and treating other patients.   Critical care was necessary to treat or prevent imminent or life-threatening deterioration.   Critical care was time spent personally by me on the following activities: development of treatment plan with patient and/or surrogate as well as nursing, discussions with consultants, evaluation of patient's response to treatment, examination of patient, obtaining history from patient or surrogate, ordering and performing treatments and interventions, ordering and review of laboratory studies, ordering and review of radiographic studies, pulse oximetry and re-evaluation of patient's condition.   Jacky Kindle MD [facility] See Amion for pager If no response to pager, please call [phone] until 7pm After 7pm, Please call E-link [phone]

## 2021-05-21 NOTE — Progress Notes (Signed)
°  Echocardiogram 2D Echocardiogram has been performed.  Darlina Sicilian M 05/25/2021, 2:53 PM

## 2021-05-21 NOTE — Progress Notes (Signed)
ANTICOAGULATION CONSULT NOTE -  Pharmacy Consult for Bivalirudin Indication:  ECMO  Allergies  Allergen Reactions   Amoxicillin-Pot Clavulanate     Per pt report on 08/19/20, makes his urine dark colored   Atorvastatin Other (See Comments)     ( pt states causing runny nose, headaches, issue w/ urination)   Plant Derived Enzymes     Other reaction(s): Unknown   Trichophyton Other (See Comments)    Patient Measurements: Height: 5\' 10"  (177.8 cm) Weight: 84.2 kg (185 lb 10 oz) IBW/kg (Calculated) : 73  Vital Signs: Temp: 98.5 F (36.9 C) (02/02 0800) Temp Source: Axillary (02/02 0800) BP: 164/71 (02/02 1534) Pulse Rate: 85 (02/02 1534)  Labs: Recent Labs    05/19/21 0209 05/19/21 0856 05/20/21 0342 05/20/21 1719 06/14/2021 0314  HGB 8.4*   < > 9.2* 9.2* 9.9*   8.6*  HCT 25.5*   < > 26.7* 27.0* 29.0*   25.6*  PLT 307  --  287  --  269  CREATININE 1.52*  --  1.38*  --  1.37*   < > = values in this interval not displayed.    Estimated Creatinine Clearance: 51.1 mL/min (A) (by C-G formula based on SCr of 1.37 mg/dL (H)).   Medical History: Past Medical History:  Diagnosis Date   Allergic rhinitis    Prostate cancer ([place])     Medications:  Infusions:   sodium chloride Stopped (05/12/2021 1034)   [MAR Hold] sodium chloride     [MAR Hold] sodium chloride 10 mL/hr at 05/31/2021 1505   amiodarone 30 mg/hr (05/22/2021 1525)   bivalirudin (ANGIOMAX) infusion 0.5 mg/mL (Non-ACS indications)     dexmedetomidine (PRECEDEX) IV infusion 1 mcg/kg/hr (06/06/2021 1536)   epoprostenol (VELETRI) for inhalation 1.5mg /89mL (30,000 ng/mL) 50 ng/kg/min (05/22/2021 1215)   feeding supplement (VITAL 1.5 CAL) Stopped (06/06/2021 1207)   [MAR Hold] meropenem (MERREM) IV     [MAR Hold] norepinephrine (LEVOPHED) Adult infusion 20 mcg/min (05/23/2021 1542)    sodium bicarbonate (isotonic) infusion in sterile water 100 mL/hr at 05/29/2021 1512   [MAR Hold] vancomycin Stopped (05/20/2021 1004)   vasopressin  0.04 Units/min (06/08/2021 1525)    Assessment: 71 year old male with coronary artery disease who initially presented with chest pain, noted to have multivessel coronary artery disease, he underwent CABG x3 on 04/1818 23, course was complicated with atrial fibrillation, frequent hiccups and aspiration pneumonia leading to hypoxia and increasing oxygen requirement. Patient now requiring VV ECMO. Pharmacy consulted for bivalirudin IV.   Baseline aptt pending  Goal of Therapy:  aPTT 50-80 seconds Monitor platelets by anticoagulation protocol: Yes   Plan:  Will start bivalirudin at 0.03 mg/kg/hr (5.1 ml/hr) Plan for aptt 4 hours after initiation aPTTs twice daily along with daily CBC ordered.  Thank you for allowing pharmacy to be a part of this patients care.  [name], PharmD Clinical Pharmacist  Please check AMION for all [place] numbers After 10:00 PM, call [place] [phone]

## 2021-05-21 NOTE — Progress Notes (Signed)
Cortrak Tube Team Note:  RD notified by RN that plan is to prone patient today. Cortrak currently in acceptable position but retained via nasal clip/tape.   RD successfully placed AMT bridle. Tube clipped in place at 95 cm. Just 1 cm different from original placement of 96 cm. Abdominal xray not needed at this time. Previous Cortrak tip noted near LOT.   X-ray is required, abdominal x-ray has been ordered by the Cortrak team. Please confirm tube placement before using the Cortrak tube.   If the tube becomes dislodged please keep the tube and contact the Cortrak team at [email or web address] (password [email or web address]) for replacement.  If after hours and replacement cannot be delayed, place a NG tube and confirm placement with an abdominal x-ray.    Kerman Passey MS, RDN, LDN, CNSC Registered Dietitian III Clinical Nutrition RD Pager and On-Call Pager Number Located in [place]

## 2021-05-21 NOTE — Progress Notes (Signed)
R and L lower lobe bronchoscopy specimens walked to lab at this time.

## 2021-05-21 NOTE — Progress Notes (Signed)
Proning policy was discussed at the bedside with Dr. Shirlean Kelly, [place] with CVTS, & RN. After discussion it was decided to continue with proning. ETT was taped at 25 at the lip with cloth tape. Mepilex was applied to patient's cheeks and above the lip to prevent skin breakdown. Patient was proned without any complications. ABG will be obtained in one hour.

## 2021-05-21 NOTE — Progress Notes (Signed)
eLink Physician-Brief Progress Note Patient Name: Randall Hodges DOB: 05-16-1950 MRN: [id number]   Date of Service  05/28/2021  HPI/Events of Note  Patient c/o increased difficulty breathing. BiPAP changes made and patient now more comfortable. Last ABG on BiPAP = 7.42/48.8/60/32 91% Sat.   eICU Interventions  Plan: Continue present management. Will have low threshold to send PCCM ground team to evaluate at bedside.      Intervention Category Major Interventions: Other:  Lysle Dingwall 05/28/2021, 5:34 AM

## 2021-05-21 NOTE — Progress Notes (Signed)
RT requested next dose of Epoprostenol with pharmacy. Currently next dose not on unit. RT will continue to monitor.

## 2021-05-21 NOTE — Progress Notes (Signed)
ETT advanced 1cm per CCM PA. RT will continue to monitor.

## 2021-05-21 NOTE — Progress Notes (Signed)
SLP Cancellation Note  Patient Details Name: Randall Hodges MRN: [id number] DOB: 1950-11-07   Cancelled treatment:       Reason Eval/Treat Not Completed: Medical issues which prohibited therapy;Other (comment) (Patient re-intubated this morning. SLP will continue to follow for patient readiness.)   Sonia Baller, MA, CCC-SLP Speech Therapy

## 2021-05-21 NOTE — Progress Notes (Signed)
Attempted bedside echo at 1pm and 1:30.  Patient is currently getting sterile procedure.

## 2021-05-21 NOTE — Anesthesia Preprocedure Evaluation (Addendum)
Anesthesia Evaluation   Patient unresponsive    Reviewed: Allergy & Precautions, NPO status , Patient's Chart, lab work & pertinent test results, Unable to perform ROS - Chart review only  History of Anesthesia Complications Negative for: history of anesthetic complications  Airway Mallampati: Intubated       Dental   Pulmonary COPD,  COPD inhaler,  ARDS    + decreased breath sounds      Cardiovascular hypertension, Pt. on medications Angina: LM disease. + CAD and + CABG   Rhythm:Regular  IMPRESSIONS   1. Limited study for pericardial effusion follow up. 2. A small pericardial effusion is present. The pericardial effusion is circumferential. The right ventricle does not open well but evidence of collapse suggesting increased intracardia pressure. There is a drop in mitral inflow variation that is  greater that 25% consistent with tamponande physiology. Septal movement consistent with ventricular interdependence. Tissue doppler not performed to assess for annulus reversus. IVC was not visualized. 3. Left ventricular ejection fraction, by estimation, is 45 to 50%. The left ventricle has mildly decreased function. The left ventricle has no regional wall motion abnormalities. Left ventricular diastolic parameters are indeterminate. 4. The right ventricle does not open well suggesting increase intracardiac pressure. . Right ventricular systolic function is normal. The right ventricular size is normal. 5. The mitral valve was not assessed. 6. The aortic valve is tricuspid. 7. The inferior vena cava is normal in size with greater than 50% respiratory variability, suggesting right atrial pressure of 3 mmHg.   S/p CABG   Neuro/Psych negative neurological ROS     GI/Hepatic Neg liver ROS, GERD  Medicated and Controlled,  Endo/Other  negative endocrine ROS  Renal/GU ARFRenal diseaseLab Results      Component                 Value               Date                      CREATININE               1.37 (H)            06/05/2021           Lab Results      Component                Value               Date                      K                        3.9                 06/13/2021                K                        3.8                 05/26/2021              H/o prostate cancer    Musculoskeletal   Abdominal   Peds  Hematology   Lab Results      Component  Value               Date                      WBC                      24.2 (H)            05/20/2021                HGB                      8.6 (L)             05/30/2021                HGB                      9.9 (L)             06/09/2021                HCT                      25.6 (L)            06/13/2021                HCT                      29.0 (L)            05/30/2021                MCV                      89.2                06/14/2021                PLT                      269                 06/04/2021              Anesthesia Other Findings   Reproductive/Obstetrics                           Anesthesia Physical  Anesthesia Plan  ASA: 4 and emergent  Anesthesia Plan: General   Post-op Pain Management:    Induction: Intravenous  PONV Risk Score and Plan: 2 and Treatment may vary due to age or medical condition  Airway Management Planned: Oral ETT  Additional Equipment: Arterial line and CVP  Intra-op Plan:   Post-operative Plan: Post-operative intubation/ventilation  Informed Consent:     Only emergency history available and History available from chart only  Plan Discussed with: Anesthesiologist and CRNA  Anesthesia Plan Comments:        Anesthesia Quick Evaluation

## 2021-05-21 NOTE — Progress Notes (Signed)
OT Cancellation Note  Patient Details Name: Randall Hodges MRN: [id number] DOB: 1951/04/05   Cancelled Treatment:    Reason Eval/Treat Not Completed: Medical issues which prohibited therapy (Re-intubated. RN request hold. Wil lreturn as schedule allows and pt medically ready)  [place], OTR/L Acute Rehab Pager: [phone] Office: [phone] 05/31/2021, 10:04 AM

## 2021-05-21 NOTE — Progress Notes (Addendum)
ECMO INITIATION   Patient: Randall Hodges, May 31, 1950, 71 y.o. Location:   Date of Service:  06/02/2021     Time: 5:37 PM  Date of Admission: 04/30/2021 Admitting diagnosis: S/P CABG x 3  Ht: 5\' 10"  (177.8 cm) Wt: 84.2 kg BSA: Body surface area is 2.04 meters squared.  Blood Type: O POS Allergies:  Allergies  Allergen Reactions   Amoxicillin-Pot Clavulanate     Per pt report on 08/19/20, makes his urine dark colored   Atorvastatin Other (See Comments)     ( pt states causing runny nose, headaches, issue w/ urination)   Plant Derived Enzymes     Other reaction(s): Unknown   Trichophyton Other (See Comments)    Past medical history:  Past Medical History:  Diagnosis Date   Allergic rhinitis    Prostate cancer ([place])    Past surgical history:  Past Surgical History:  Procedure Laterality Date   CORONARY ARTERY BYPASS GRAFT N/A 05/07/2021   Procedure: CORONARY ARTERY BYPASS GRAFTING (CABG) x3 on pump using left internal mammary artery and right endoscopic greater saphenous vein conduit.;  Surgeon: Dahlia Byes, MD;  Location: [facility];  Service: Open Heart Surgery;  Laterality: N/A;   ENDOVEIN HARVEST OF GREATER SAPHENOUS VEIN Right 04/28/2021   Procedure: ENDOVEIN HARVEST OF GREATER SAPHENOUS VEIN;  Surgeon: Dahlia Byes, MD;  Location: [place];  Service: Open Heart Surgery;  Laterality: Right;   LEFT HEART CATH AND CORONARY ANGIOGRAPHY N/A 04/21/2021   Procedure: LEFT HEART CATH AND CORONARY ANGIOGRAPHY;  Surgeon: Belva Crome, MD;  Location: [place] [facility];  Service: Cardiovascular;  Laterality: N/A;   PROSTATE BIOPSY     RIGHT/LEFT HEART CATH AND CORONARY ANGIOGRAPHY N/A 05/11/2021   Procedure: RIGHT/LEFT HEART CATH AND CORONARY ANGIOGRAPHY;  Surgeon: Larey Dresser, MD;  Location: [place] [facility];  Service: Cardiovascular;  Laterality: N/A;   SHOULDER SURGERY     TEE WITHOUT CARDIOVERSION N/A 04/21/2021   Procedure: TRANSESOPHAGEAL ECHOCARDIOGRAM (TEE);  Surgeon:  Dahlia Byes, MD;  Location: [place];  Service: Open Heart Surgery;  Laterality: N/A;    Indication for ECMO: ARDS  ECMO was deployed at 1500 and initiated at 1608  Anticoagulation achieved with Heparin bolus of 7000 units given to patient at 1601. Cannulated for ECMO Mode: (S) VV (Initiation) and achieved initial ECMO Flow (LPM): 3.65 and ECMO Sweep Gas (LPM): 2.    ECMO Cannula Information     Staff Present  Primary Perfusionist Martinique Castillejo  Assisting Perfusionist/ECMO Specialist Kaylee Quick Warden Fillers  Cannulating Physician [facility]   ECMO Lot Numbers  [place]  [id number]  [place]  [id number]  ECMO Goals  Flow goal   Flow Goal: 3.5-4.0   Anticoagulation goal   Anticoagulation Goal: -- (Lower end of therapeutic)   Cardiac goal     MAP >65  Respiratory goal     O2 Sats>88%  Other goal     PTT goal 60-80   ECMO Handoff  Patient Information * Age Height Weight BSA IBW BMI  70 y.o. 5\' 10"  (177.8 cm)  (84.2 kg Body surface area is 2.04 meters squared. No data recorded Body mass index is 26.63 kg/m.   Review History * Primary Diagnosis   S/P CABG x 3  Prior Cardiac Arrest within 24hrs of ECMO initiation?   ECMO and MCS * Type ECMO Flow ECMO Sweep Gases   ECMO Device: Cardiohelp   Flow (LPM): 3.65   Sweep Gas (LPM):  2     Additional Mechanical Support   Ventilation *    Vent Mode: PCV, Vt Set: 430 mL, Set Rate: 12 bmp, FiO2 (%): 100 %, I Time: 1 Sec(s), PEEP: 10 cmH20     Cannula Size and Locations   32 Fr. Right IJ    Drainage 32 Fr. Right IJ   Return 94 Fr. Right IJ    *Cannula(e) sutured and anchored, secured and dressed.   Labs and Imaging *  *Cannulation position verified via imaging on arrival to ICU. Concerns communicated to attending surgeon. Labs reviewed.   All ECMO safety checks complete. ECMO flowsheet initiated, applicable charges captured, LDA's entered/confirmed, imaging and labs verified, blood  products available, and report given to Robbie Lis ECMO Specialist

## 2021-05-21 NOTE — Procedures (Signed)
Bronchoscopy Procedure Note  Randall Hodges  [id number]  [birth year]  Date:06/14/2021  Time:10:17 AM   Provider Performing:Nautica Hotz   Procedure(s):  Flexible Bronchoscopy [phone]) and Flexible bronchoscopy with bronchial alveolar lavage (25366)  Indication(s) Acute respiratory failure with hypoxia  Consent Risks of the procedure as well as the alternatives and risks of each were explained to the patient and/or caregiver.  Consent for the procedure was obtained and is signed in the bedside chart  Anesthesia Etomidate and rocuronium   Time Out Verified patient identification, verified procedure, site/side was marked, verified correct patient position, special equipment/implants available, medications/allergies/relevant history reviewed, required imaging and test results available.   Sterile Technique Usual hand hygiene, masks, gowns, and gloves were used   Procedure Description Bronchoscope advanced through endotracheal tube and into airway.  Airways were examined down to subsegmental level with findings noted below.   Following diagnostic evaluation, BAL(s) performed in right lower lobe and left lower lobe with normal saline and return of 10 cc fluid  Findings: Friable mucosa with thick small amount of mucus adhered all over the respiratory tree, BAL was performed in the right lower lobe and left lower lobe with aspiration of thick mucous secretions   Complications/Tolerance None; patient tolerated the procedure well. Chest X-ray is needed post procedure.   EBL Minimal   Specimen(s) Sent for culture

## 2021-05-21 NOTE — Progress Notes (Addendum)
[address]       [place] [address]             [phone]     15 Days Post-op CABG x 3   3 Days Post-Op Procedure(s) (LRB): RIGHT/LEFT HEART CATH AND CORONARY ANGIOGRAPHY (N/A) Subjective:   On BiPAP and sedated with Precedex.  Had several episodes of anxiety overnight with worsening hypoxia and hypercarbia. Precedex dosing increased.   Remains on Amiodarone, Levophed and vasopressin  TF infusing at 23ml/min  CoOx 63 CVP 11  Objective: Vital signs in last 24 hours: Temp:  [97.5 F (36.4 C)-99.6 F (37.6 C)] 97.5 F (36.4 C) (02/01 2327) Pulse Rate:  [77-112] 77 (02/02 0600) Cardiac Rhythm: Normal sinus rhythm;Atrial fibrillation (02/01 1945) Resp:  [22-43] 31 (02/02 0600) BP: (96-177)/(51-88) 131/58 (02/02 0400) SpO2:  [86 %-100 %] 98 % (02/02 0600) Arterial Line BP: (103-198)/(37-74) 105/47 (02/02 0600) FiO2 (%):  [60 %-100 %] 100 % (02/02 0457) CVP:  [8 mmHg-11 mmHg] 8 mmHg  Intake/Output from previous day: 02/01 0701 - 02/02 0700 In: 2921.5 [I.V.:1239.6; NG/GT:1231.3; IV Piggyback:430.6] Out: 1800 [Urine:1800] Intake/Output this shift: No intake/output data recorded.  General appearance: sedated with Precedex less responsive. Neurologic: unable to assess due to sedation Heart: SR with PAF Lungs: breath sounds are coarse, few exp wheezes. On BiPAP, FiO2 0.6. CXR with minimal change from yesterday Abdomen: soft, no apparent tenderness Extremities: 1+ LE edema. The RLE EVH incision is intact and dry.  Wound: the sternotomy incision remains well approximated and dry.   Lab Results: Recent Labs    05/20/21 0342 05/20/21 1719 05/27/2021 0314  WBC 21.5*  --  24.2*  HGB 9.2* 9.2* 9.9*   8.6*  HCT 26.7* 27.0* 29.0*   25.6*  PLT 287  --  269    BMET:  Recent Labs    05/20/21 0342 05/20/21 1719 06/09/2021 0314  NA 134* 132* 133*   133*  K 3.5 3.5 3.8   3.9  CL 89*  --  93*  CO2 33*  --  30  GLUCOSE 134*  --  177*  BUN 28*  --   31*  CREATININE 1.38*  --  1.37*  CALCIUM 8.4*  --  8.1*     PT/INR: No results for input(s): LABPROT, INR in the last 72 hours. ABG    Component Value Date/Time   PHART 7.423 05/20/2021 0314   HCO3 32.0 (H) 05/24/2021 0314   TCO2 34 (H) 06/01/2021 0314   ACIDBASEDEF PENDING 05/20/2021 0232   O2SAT 62.9 05/22/2021 0343   CBG (last 3)  Recent Labs    05/20/21 2325 06/02/2021 0311 06/11/2021 0722  GLUCAP 150* 179* 181*    RIGHT/LEFT HEART CATH AND CORONARY ANGIOGRAPHY   Conclusion      Right Heart: Simultaneous RV/LV tracings were obtained. Difficult to interpret due to atrial fibrillation. There was some suggestion of discordance (ventricular interdependence) but not clear.   1. RA pressure increased, PCWP normal range.  2. Equalization of diastolic pressures (RA, PCWP, LVEDP, RVEDP).  3. Preserved cardiac output.  4. Simultaneous RV/LV tracings were obtained.  Difficult to interpret due to atrial fibrillation.  There was some suggestion of discordance (ventricular interdependence) but not clear.        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  Simultaneous RV/LV tracings are not definitive of ventricular interdependence, however.      Assessment/Plan: S/P Procedure(s) (LRB): RIGHT/LEFT HEART CATH AND CORONARY ANGIOGRAPHY (  N/A)  -POD-15 CABG x 3 presenting with acute STEMI . EF 45% with global hypokinesis on echo 1/25, small pericardial effusion. 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 Currently not a candidate for surgical intervention.  On colchicine, avoiding NSAIDs due to marginal renal function.   -PULM- More hypoxic and hypercarbic. FiO2 and BiPAP support have been increased since yesterday to maintain oxygenation. Suspected aspiration pneumonia with bilateral infiltrates on CXR. On Zosyn.  Sputum Cx ordered but still has not produced an adequate specimen. Considering bronch and chest CT.   Repeat COVID test yesterday was negative.  -Paroxysmal a-fib / a-flutter- on amiodarone infusion.  Now appears to be in SR.   Eliquis discontinued, now on daily enoxaparin.   -GI- Had persistent hiccups for several days after surgery, better past 48 hrs (probably related to the Precedex). On Reglan, Protonix and PRN IV Valium.  Currently NPO, supporting nutrition with TF via [employer] at goal rate of 1ml/hr.   -Acute renal insufficiency- Creat stable but UO less over past several hours  Monitor.     LOS: 16 days    Antony Odea, [place] [phone] 05/24/2021  Patient examined, CXR image reviewed Patient electively intubated this am due to fatigue and poor respiratory movement. Unfortunately ABG w/o improvement VV ECMO to be initiated for bilateral aspiration pneumonia Situation and plan of care d/w patients wife in ICU waiting room  patient examined and medical record reviewed,agree with above note. Dahlia Byes 06/02/2021

## 2021-05-21 NOTE — Consult Note (Signed)
ECMO Consult Note   Called to 2H for ECMO Consult at (time)11:30 by Tacy Learn, MD Admitting Diagnosis- CAD Primary Issue- ARDS Age:71 y.o. Weight: 84.2kg  Days on Mechanical Ventilation- 0  MAP FiO2 Oxygen Index P/F Ratio  69 100%  134    Vasopressors yes   MSOF No   RESP score (VV-ECMO) : 2 http://www.respscore.com  SAVE score (VA-ECMO) :  http://www.save-score.com  Recent Blood Gas:     Component Value Date/Time   PHART 6.992 (LL) 06/12/2021 0950   PCO2ART >120 (HH) 05/26/2021 0950   PO2ART 134 (H) 06/08/2021 0950   HCO3 30.3 (H) 05/28/2021 0950   TCO2 34 (H) 06/13/2021 0314   ACIDBASEDEF 0.6 06/03/2021 0950   O2SAT 96.1 06/03/2021 0950    Coags:    Component Value Date/Time   INR 1.5 (H) 04/23/2021 1715    CBC    Component Value Date/Time   WBC 24.2 (H) 06/03/2021 0314   RBC 2.87 (L) 06/01/2021 0314   HGB 8.6 (L) 06/16/2021 0314   HGB 9.9 (L) 06/01/2021 0314   HCT 25.6 (L) 06/13/2021 0314   HCT 29.0 (L) 06/06/2021 0314   PLT 269 05/26/2021 0314   MCV 89.2 06/03/2021 0314   MCH 30.0 06/15/2021 0314   MCHC 33.6 06/06/2021 0314   RDW 14.4 06/13/2021 0314   LYMPHSABS 2.5 08/01/2017 1627   MONOABS 0.7 08/01/2017 1627   EOSABS 0.4 08/01/2017 1627   BASOSABS 0.0 08/01/2017 1627    BMET    Component Value Date/Time   NA 133 (L) 06/16/2021 0314   NA 133 (L) 05/26/2021 0314   NA 142 08/08/2020 1546   K 3.9 05/24/2021 0314   K 3.8 06/16/2021 0314   CL 93 (L) 05/30/2021 0314   CO2 30 06/06/2021 0314   GLUCOSE 177 (H) 05/27/2021 0314   BUN 31 (H) 06/09/2021 0314   BUN 17 08/08/2020 1546   CREATININE 1.37 (H) 06/11/2021 0314   CALCIUM 8.1 (L) 06/16/2021 0314   GFRNONAA 55 (L) 06/13/2021 0314                                                                                                                                                             ECMO physician Truman Hayward notified at 11:30 (time) Candidate meets ECMO Criteria- Yes   Placed on ECMO watch at 11:35 (time).  Additional notes- CABG, aspiration pneumonia, ARDS, failed BiPAP Reason for Consult:ARDS, refractory hypercapnia Referring Physician: EZRI LANDERS is an 71 y.o. male.  HPI: Mr. Whitner is a 71 y/o gentleman with a history of CAD who presented to the ED with chest pain who underwent heart catherization, found to have severe multivessel CAD with normal LV function. He underwent CABG x 3 on 1/18. He had recovered uneventfully but while continuing  treatment on the floor he vomited and aspirated. Due to severe respiratory failure he was transferred to the ICU on 1/30. He was maintained on BiPAP, but due to progressive decline he was intubated on 2/2. He had high peak and plateau pressures with severe hypercapnia. Despite proning and heavy sedation his pH remained 7.06 with pCO2 remaining >110. ECMO consult called for hypercapnia and high vent pressures.    Past Medical History:  Diagnosis Date   Allergic rhinitis    Prostate cancer [facility])     Past Surgical History:  Procedure Laterality Date   CORONARY ARTERY BYPASS GRAFT N/A 04/21/2021   Procedure: CORONARY ARTERY BYPASS GRAFTING (CABG) x3 on pump using left internal mammary artery and right endoscopic greater saphenous vein conduit.;  Surgeon: Dahlia Byes, MD;  Location: [place];  Service: Open Heart Surgery;  Laterality: N/A;   ENDOVEIN HARVEST OF GREATER SAPHENOUS VEIN Right 04/21/2021   Procedure: ENDOVEIN HARVEST OF GREATER SAPHENOUS VEIN;  Surgeon: Dahlia Byes, MD;  Location: [place];  Service: Open Heart Surgery;  Laterality: Right;   LEFT HEART CATH AND CORONARY ANGIOGRAPHY N/A 05/16/2021   Procedure: LEFT HEART CATH AND CORONARY ANGIOGRAPHY;  Surgeon: Belva Crome, MD;  Location: [place] CV LAB;  Service: Cardiovascular;  Laterality: N/A;   PROSTATE BIOPSY     RIGHT/LEFT HEART CATH AND CORONARY ANGIOGRAPHY N/A 05/17/2021   Procedure: RIGHT/LEFT HEART CATH AND CORONARY  ANGIOGRAPHY;  Surgeon: Larey Dresser, MD;  Location: [place] [facility];  Service: Cardiovascular;  Laterality: N/A;   SHOULDER SURGERY     TEE WITHOUT CARDIOVERSION N/A 05/19/2021   Procedure: TRANSESOPHAGEAL ECHOCARDIOGRAM (TEE);  Surgeon: Dahlia Byes, MD;  Location: [place];  Service: Open Heart Surgery;  Laterality: N/A;    Family History  Problem Relation Age of Onset   Diabetes Father    Prostate cancer Father 60       had an enlarged prostate and had it removed   Hypertension Paternal Grandmother    Prostate cancer Brother 71       metastatic to bone   Breast cancer Neg Hx    Colon cancer Neg Hx    Pancreatic cancer Neg Hx     Social History:  reports that he has never smoked. He has never used smokeless tobacco. He reports current alcohol use. He reports that he does not use drugs.  Allergies:  Allergies  Allergen Reactions   Amoxicillin-Pot Clavulanate     Per pt report on 08/19/20, makes his urine dark colored   Atorvastatin Other (See Comments)     ( pt states causing runny nose, headaches, issue w/ urination)   Plant Derived Enzymes     Other reaction(s): Unknown   Trichophyton Other (See Comments)    Medications: I have reviewed the patient's current medications.  Results for orders placed or performed during the hospital encounter of 05/11/2021 (from the past 48 hour(s))  Glucose, capillary     Status: Abnormal   Collection Time: 05/19/21  4:05 PM  Result Value Ref Range   Glucose-Capillary 160 (H) 70 - 99 mg/dL    Comment: Glucose reference range applies only to samples taken after fasting for at least 8 hours.  I-STAT 7, (LYTES, BLD GAS, ICA, H+H)     Status: Abnormal (Preliminary result)   Collection Time: 05/19/21  4:38 PM  Result Value Ref Range   FIO2 PENDING    O2 Content PENDING L/min   Delivery systems PENDING    Mode PENDING  VT PENDING mL   LHR PENDING resp/min   Peep/cpap PENDING cm H20   Pressure support PENDING cm H20   Pressure  control PENDING cm H20   pH, Arterial 7.450 7.350 - 7.450   pCO2 arterial 48.5 (H) 32.0 - 48.0 mmHg   pO2, Arterial 75.0 (L) 83.0 - 108.0 mmHg   Bicarbonate 33.7 (H) 20.0 - 28.0 mmol/L   TCO2 35.0 (H) 22 - 32 mmol/L   O2 Saturation 95.0 %   Acid-Base Excess 9.0 (H) 0.0 - 2.0 mmol/L   Acid-base deficit PENDING 0.0 - 2.0 mmol/L   Sodium 131 (L) 135 - 145 mmol/L   Potassium 3.7 3.5 - 5.1 mmol/L   Calcium, Ion 1.15 1.15 - 1.40 mmol/L   HCT 28.0 (L) 39.0 - 52.0 %   Hemoglobin 9.5 (L) 13.0 - 17.0 g/dL   Patient temperature 97.2    Collection site ARTERIAL     Comment: Performed at [facility] [address] [place], [place] [address]   Drawn by PENDING    Sample type PENDING    Comment PENDING   Glucose, capillary     Status: Abnormal   Collection Time: 05/19/21  8:11 PM  Result Value Ref Range   Glucose-Capillary 171 (H) 70 - 99 mg/dL    Comment: Glucose reference range applies only to samples taken after fasting for at least 8 hours.  Glucose, capillary     Status: Abnormal   Collection Time: 05/19/21 10:31 PM  Result Value Ref Range   Glucose-Capillary 167 (H) 70 - 99 mg/dL    Comment: Glucose reference range applies only to samples taken after fasting for at least 8 hours.  Glucose, capillary     Status: Abnormal   Collection Time: 05/19/21 11:36 PM  Result Value Ref Range   Glucose-Capillary 150 (H) 70 - 99 mg/dL    Comment: Glucose reference range applies only to samples taken after fasting for at least 8 hours.  I-STAT 7, (LYTES, BLD GAS, ICA, H+H)     Status: Abnormal (Preliminary result)   Collection Time: 05/20/21  2:32 AM  Result Value Ref Range   FIO2 PENDING    O2 Content PENDING L/min   Delivery systems PENDING    Mode PENDING    VT PENDING mL   LHR PENDING resp/min   Peep/cpap PENDING cm H20   Pressure support PENDING cm H20   Pressure control PENDING cm H20   pH, Arterial 7.487 (H) 7.350 - 7.450   pCO2 arterial 50.3 (H) 32.0 - 48.0 mmHg   pO2,  Arterial 86 83.0 - 108.0 mmHg   Bicarbonate 38.0 (H) 20.0 - 28.0 mmol/L   TCO2 40 (H) 22 - 32 mmol/L   O2 Saturation 97.0 %   Acid-Base Excess 13.0 (H) 0.0 - 2.0 mmol/L   Acid-base deficit PENDING 0.0 - 2.0 mmol/L   Sodium 131 (L) 135 - 145 mmol/L   Potassium 3.4 (L) 3.5 - 5.1 mmol/L   Calcium, Ion 1.13 (L) 1.15 - 1.40 mmol/L   HCT 28.0 (L) 39.0 - 52.0 %   Hemoglobin 9.5 (L) 13.0 - 17.0 g/dL   Patient temperature 98.6    Collection site ARTERIAL     Comment: Performed at [facility], [place] [address], [place], [place] [address]   Drawn by PENDING    Sample type PENDING    Comment PENDING   Basic metabolic panel     Status: Abnormal   Collection Time: 05/20/21  3:42  AM  Result Value Ref Range   Sodium 134 (L) 135 - 145 mmol/L   Potassium 3.5 3.5 - 5.1 mmol/L   Chloride 89 (L) 98 - 111 mmol/L   CO2 33 (H) 22 - 32 mmol/L   Glucose, Bld 134 (H) 70 - 99 mg/dL    Comment: Glucose reference range applies only to samples taken after fasting for at least 8 hours.   BUN 28 (H) 8 - 23 mg/dL   Creatinine, Ser 1.38 (H) 0.61 - 1.24 mg/dL   Calcium 8.4 (L) 8.9 - 10.3 mg/dL   GFR, Estimated 55 (L) >60 mL/min    Comment: (NOTE) Calculated using the CKD-EPI Creatinine Equation (2021)    Anion gap 12 5 - 15    Comment: Performed at [facility] [address] [place], [place] [address]  CBC     Status: Abnormal   Collection Time: 05/20/21  3:42 AM  Result Value Ref Range   WBC 21.5 (H) 4.0 - 10.5 K/uL   RBC 3.04 (L) 4.22 - 5.81 MIL/uL   Hemoglobin 9.2 (L) 13.0 - 17.0 g/dL   HCT 26.7 (L) 39.0 - 52.0 %   MCV 87.8 80.0 - 100.0 fL   MCH 30.3 26.0 - 34.0 pg   MCHC 34.5 30.0 - 36.0 g/dL   RDW 13.9 11.5 - 15.5 %   Platelets 287 150 - 400 K/uL   nRBC 0.0 0.0 - 0.2 %    Comment: Performed at [facility], [place] [address] [place], [place] [address]  Glucose, capillary     Status: Abnormal   Collection Time: 05/20/21  3:56 AM  Result Value Ref Range   Glucose-Capillary 144  (H) 70 - 99 mg/dL    Comment: Glucose reference range applies only to samples taken after fasting for at least 8 hours.  Cooxemetry Panel (carboxy, met, total hgb, O2 sat)     Status: Abnormal   Collection Time: 05/20/21  5:02 AM  Result Value Ref Range   Total hemoglobin 9.1 (L) 12.0 - 16.0 g/dL   O2 Saturation 54.1 %   Carboxyhemoglobin 1.6 (H) 0.5 - 1.5 %   Methemoglobin 1.3 0.0 - 1.5 %    Comment: Performed at [facility] [address] [place], [place] [address]  Glucose, capillary     Status: Abnormal   Collection Time: 05/20/21  5:49 AM  Result Value Ref Range   Glucose-Capillary 128 (H) 70 - 99 mg/dL    Comment: Glucose reference range applies only to samples taken after fasting for at least 8 hours.  Glucose, capillary     Status: None   Collection Time: 05/20/21  7:33 AM  Result Value Ref Range   Glucose-Capillary 96 70 - 99 mg/dL    Comment: Glucose reference range applies only to samples taken after fasting for at least 8 hours.  Magnesium     Status: None   Collection Time: 05/20/21  7:46 AM  Result Value Ref Range   Magnesium 1.9 1.7 - 2.4 mg/dL    Comment: Performed at [facility] [address] [place], [place] [address]  Resp Panel by RT-PCR (Flu A&B, Covid) Nasopharyngeal Swab     Status: None   Collection Time: 05/20/21  7:57 AM   Specimen: Nasopharyngeal Swab; Nasopharyngeal(NP) swabs in vial transport medium  Result Value Ref Range   SARS Coronavirus 2 by RT PCR NEGATIVE NEGATIVE    Comment: (NOTE) SARS-CoV-2 target nucleic acids are  NOT DETECTED.  The SARS-CoV-2 RNA is generally detectable in upper respiratory specimens during the acute phase of infection. The lowest concentration of SARS-CoV-2 viral copies this assay can detect is 138 copies/mL. A negative result does not preclude SARS-Cov-2 infection and should not be used as the sole basis for treatment or other patient management decisions. A negative result may occur with  improper  specimen collection/handling, submission of specimen other than nasopharyngeal swab, presence of viral mutation(s) within the areas targeted by this assay, and inadequate number of viral copies(<138 copies/mL). A negative result must be combined with clinical observations, patient history, and epidemiological information. The expected result is Negative.  Fact Sheet for Patients:  EntrepreneurPulse.com.au  Fact Sheet for Healthcare Providers:  IncredibleEmployment.be  This test is no t yet approved or cleared by the [place] FDA and  has been authorized for detection and/or diagnosis of SARS-CoV-2 by FDA under an Emergency Use Authorization (EUA). This EUA will remain  in effect (meaning this test can be used) for the duration of the COVID-19 declaration under Section 564(b)(1) of the Act, 21 U.S.C.section 360bbb-3(b)(1), unless the authorization is terminated  or revoked sooner.       Influenza A by PCR NEGATIVE NEGATIVE   Influenza B by PCR NEGATIVE NEGATIVE    Comment: (NOTE) The Xpert Xpress SARS-CoV-2/FLU/RSV plus assay is intended as an aid in the diagnosis of influenza from Nasopharyngeal swab specimens and should not be used as a sole basis for treatment. Nasal washings and aspirates are unacceptable for Xpert Xpress SARS-CoV-2/FLU/RSV testing.  Fact Sheet for Patients: EntrepreneurPulse.com.au  Fact Sheet for Healthcare Providers: IncredibleEmployment.be  This test is not yet approved or cleared by the [place] FDA and has been authorized for detection and/or diagnosis of SARS-CoV-2 by FDA under an Emergency Use Authorization (EUA). This EUA will remain in effect (meaning this test can be used) for the duration of the COVID-19 declaration under Section 564(b)(1) of the Act, 21 U.S.C. section 360bbb-3(b)(1), unless the authorization is terminated or revoked.  Performed at [facility], [place] [address] [place], [place] [address]   MRSA Next Gen by PCR, Nasal     Status: None   Collection Time: 05/20/21  7:57 AM   Specimen: Nasopharyngeal Swab; Nasal Swab  Result Value Ref Range   MRSA by PCR Next Gen NOT DETECTED NOT DETECTED    Comment: (NOTE) The GeneXpert MRSA Assay (FDA approved for NASAL specimens only), is one component of a comprehensive MRSA colonization surveillance program. It is not intended to diagnose MRSA infection nor to guide or monitor treatment for MRSA infections. Test performance is not FDA approved in patients less than 34 years old. Performed at [facility], [place] [address], [place],  [address]   Glucose, capillary     Status: Abnormal   Collection Time: 05/20/21 11:20 AM  Result Value Ref Range   Glucose-Capillary 140 (H) 70 - 99 mg/dL    Comment: Glucose reference range applies only to samples taken after fasting for at least 8 hours.  Glucose, capillary     Status: Abnormal   Collection Time: 05/20/21  4:21 PM  Result Value Ref Range   Glucose-Capillary 200 (H) 70 - 99 mg/dL    Comment: Glucose reference range applies only to samples taken after fasting for at least 8 hours.  I-STAT 7, (LYTES, BLD GAS, ICA, H+H)     Status: Abnormal   Collection Time: 05/20/21  5:19 PM  Result Value  Ref Range   pH, Arterial 7.453 (H) 7.350 - 7.450   pCO2 arterial 47.6 32.0 - 48.0 mmHg   pO2, Arterial 64 (L) 83.0 - 108.0 mmHg   Bicarbonate 33.3 (H) 20.0 - 28.0 mmol/L   TCO2 35 (H) 22 - 32 mmol/L   O2 Saturation 93.0 %   Acid-Base Excess 8.0 (H) 0.0 - 2.0 mmol/L   Sodium 132 (L) 135 - 145 mmol/L   Potassium 3.5 3.5 - 5.1 mmol/L   Calcium, Ion 1.13 (L) 1.15 - 1.40 mmol/L   HCT 27.0 (L) 39.0 - 52.0 %   Hemoglobin 9.2 (L) 13.0 - 17.0 g/dL   Sample type ARTERIAL   Glucose, capillary     Status: Abnormal   Collection Time: 05/20/21  7:30 PM  Result Value Ref Range   Glucose-Capillary 193 (H) 70 - 99 mg/dL    Comment: Glucose  reference range applies only to samples taken after fasting for at least 8 hours.  Glucose, capillary     Status: Abnormal   Collection Time: 05/20/21 11:25 PM  Result Value Ref Range   Glucose-Capillary 150 (H) 70 - 99 mg/dL    Comment: Glucose reference range applies only to samples taken after fasting for at least 8 hours.  Glucose, capillary     Status: Abnormal   Collection Time: 06/15/2021  3:11 AM  Result Value Ref Range   Glucose-Capillary 179 (H) 70 - 99 mg/dL    Comment: Glucose reference range applies only to samples taken after fasting for at least 8 hours.  Basic metabolic panel     Status: Abnormal   Collection Time: 06/11/2021  3:14 AM  Result Value Ref Range   Sodium 133 (L) 135 - 145 mmol/L   Potassium 3.9 3.5 - 5.1 mmol/L   Chloride 93 (L) 98 - 111 mmol/L   CO2 30 22 - 32 mmol/L   Glucose, Bld 177 (H) 70 - 99 mg/dL    Comment: Glucose reference range applies only to samples taken after fasting for at least 8 hours.   BUN 31 (H) 8 - 23 mg/dL   Creatinine, Ser 1.37 (H) 0.61 - 1.24 mg/dL   Calcium 8.1 (L) 8.9 - 10.3 mg/dL   GFR, Estimated 55 (L) >60 mL/min    Comment: (NOTE) Calculated using the CKD-EPI Creatinine Equation (2021)    Anion gap 10 5 - 15    Comment: Performed at [facility] [address] [place], [place] [address]  CBC     Status: Abnormal   Collection Time: 06/10/2021  3:14 AM  Result Value Ref Range   WBC 24.2 (H) 4.0 - 10.5 K/uL   RBC 2.87 (L) 4.22 - 5.81 MIL/uL   Hemoglobin 8.6 (L) 13.0 - 17.0 g/dL   HCT 25.6 (L) 39.0 - 52.0 %   MCV 89.2 80.0 - 100.0 fL   MCH 30.0 26.0 - 34.0 pg   MCHC 33.6 30.0 - 36.0 g/dL   RDW 14.4 11.5 - 15.5 %   Platelets 269 150 - 400 K/uL   nRBC 0.0 0.0 - 0.2 %    Comment: Performed at [facility], [place] [address] [place], [place] [address]  I-STAT 7, (LYTES, BLD GAS, ICA, H+H)     Status: Abnormal   Collection Time: 06/08/2021  3:14 AM  Result Value Ref Range   pH, Arterial 7.423 7.350 - 7.450   pCO2  arterial 48.8 (H) 32.0 - 48.0 mmHg   pO2, Arterial 60 (L) 83.0 -  108.0 mmHg   Bicarbonate 32.0 (H) 20.0 - 28.0 mmol/L   TCO2 34 (H) 22 - 32 mmol/L   O2 Saturation 91.0 %   Acid-Base Excess 7.0 (H) 0.0 - 2.0 mmol/L   Sodium 133 (L) 135 - 145 mmol/L   Potassium 3.8 3.5 - 5.1 mmol/L   Calcium, Ion 1.19 1.15 - 1.40 mmol/L   HCT 29.0 (L) 39.0 - 52.0 %   Hemoglobin 9.9 (L) 13.0 - 17.0 g/dL   Patient temperature 97.8 F    Sample type ARTERIAL   Cooxemetry Panel (carboxy, met, total hgb, O2 sat)     Status: Abnormal   Collection Time: 06/04/2021  3:43 AM  Result Value Ref Range   Total hemoglobin 9.6 (L) 12.0 - 16.0 g/dL   O2 Saturation 62.9 %   Carboxyhemoglobin 1.4 0.5 - 1.5 %   Methemoglobin 1.0 0.0 - 1.5 %    Comment: Performed at [facility], [place] [address] [place], [place] [address]  Glucose, capillary     Status: Abnormal   Collection Time: 06/07/2021  7:22 AM  Result Value Ref Range   Glucose-Capillary 181 (H) 70 - 99 mg/dL    Comment: Glucose reference range applies only to samples taken after fasting for at least 8 hours.  Procalcitonin - Baseline     Status: None   Collection Time: 05/25/2021  7:46 AM  Result Value Ref Range   Procalcitonin 0.74 ng/mL    Comment:        Interpretation: PCT > 0.5 ng/mL and <= 2 ng/mL: Systemic infection (sepsis) is possible, but other conditions are known to elevate PCT as well. (NOTE)       Sepsis PCT Algorithm           Lower Respiratory Tract                                      Infection PCT Algorithm    ----------------------------     ----------------------------         PCT < 0.25 ng/mL                PCT < 0.10 ng/mL          Strongly encourage             Strongly discourage   discontinuation of antibiotics    initiation of antibiotics    ----------------------------     -----------------------------       PCT 0.25 - 0.50 ng/mL            PCT 0.10 - 0.25 ng/mL               OR       >80% decrease in PCT             Discourage initiation of                                            antibiotics      Encourage discontinuation           of antibiotics    ----------------------------     -----------------------------         PCT >= 0.50 ng/mL              PCT 0.26 - 0.50 ng/mL  AND       <80% decrease in PCT             Encourage initiation of                                             antibiotics       Encourage continuation           of antibiotics    ----------------------------     -----------------------------        PCT >= 0.50 ng/mL                  PCT > 0.50 ng/mL               AND         increase in PCT                  Strongly encourage                                      initiation of antibiotics    Strongly encourage escalation           of antibiotics                                     -----------------------------                                           PCT <= 0.25 ng/mL                                                 OR                                        > 80% decrease in PCT                                      Discontinue / Do not initiate                                             antibiotics  Performed at [facility], [place] [address] [place], [place] [address]   Lactic acid, plasma     Status: None   Collection Time: 05/20/2021  7:46 AM  Result Value Ref Range   Lactic Acid, Venous 1.9 0.5 - 1.9 mmol/L    Comment: Performed at [facility] [address], [place], [place] [address]  Respiratory (~20 pathogens) panel by PCR     Status: None   Collection Time: 06/14/2021  8:55 AM   Specimen: Bronchoalveolar Lavage; Respiratory  Result Value Ref Range   Adenovirus NOT DETECTED  NOT DETECTED   Coronavirus 229E NOT DETECTED NOT DETECTED    Comment: (NOTE) The Coronavirus on the Respiratory Panel, DOES NOT test for the novel  Coronavirus (2019 nCoV)    Coronavirus HKU1 NOT DETECTED NOT DETECTED   Coronavirus NL63 NOT DETECTED NOT DETECTED    Coronavirus OC43 NOT DETECTED NOT DETECTED   Metapneumovirus NOT DETECTED NOT DETECTED   Rhinovirus / Enterovirus NOT DETECTED NOT DETECTED   Influenza A NOT DETECTED NOT DETECTED   Influenza B NOT DETECTED NOT DETECTED   Parainfluenza Virus 1 NOT DETECTED NOT DETECTED   Parainfluenza Virus 2 NOT DETECTED NOT DETECTED   Parainfluenza Virus 3 NOT DETECTED NOT DETECTED   Parainfluenza Virus 4 NOT DETECTED NOT DETECTED   Respiratory Syncytial Virus NOT DETECTED NOT DETECTED   Bordetella pertussis NOT DETECTED NOT DETECTED   Bordetella Parapertussis NOT DETECTED NOT DETECTED   Chlamydophila pneumoniae NOT DETECTED NOT DETECTED   Mycoplasma pneumoniae NOT DETECTED NOT DETECTED    Comment: Performed at [facility], [place] [address], [place], [place] [address]  Draw ABG 1 hour after initiation of ventilator     Status: Abnormal   Collection Time: 06/09/2021  9:50 AM  Result Value Ref Range   FIO2 90.00    pH, Arterial 6.992 (LL) 7.350 - 7.450    Comment: CRITICAL RESULT CALLED TO, READ BACK BY AND VERIFIED WITH:  SARA FRANKLIN RN 06/13/2021 1052 BY Z. BEECH    pCO2 arterial >120 (HH) 32.0 - 48.0 mmHg   pO2, Arterial 134 (H) 83.0 - 108.0 mmHg    Comment: CRITICAL RESULT CALLED TO, READ BACK BY AND VERIFIED WITH:  SARA FRANKLIN RN 05/24/2021 1052 BY Z. BEECH    Bicarbonate 30.3 (H) 20.0 - 28.0 mmol/L   Acid-base deficit 0.6 0.0 - 2.0 mmol/L   O2 Saturation 96.1 %   Patient temperature 37.0    Collection site A-LINE DRAW    Drawn by DRAWN BY RN     Comment:  SARA FRANKLIN   Sample type ARTERIAL DRAW    Allens test (pass/fail) A-LINE DRAW (A) PASS    Comment: Performed at [facility], [place] [address] [place], [place] [address]  Lactic acid, plasma     Status: Abnormal   Collection Time: 06/11/2021  9:54 AM  Result Value Ref Range   Lactic Acid, Venous 2.3 (HH) 0.5 - 1.9 mmol/L    Comment: CRITICAL RESULT CALLED TO, READ BACK BY AND VERIFIED WITH: S.[facility] 06/04/2021 AT 1041  A.HUGHES Performed at [facility], [place] [address] [place], [place] [address]     Portable Chest x-ray  Result Date: 06/10/2021 CLINICAL DATA:  Shortness of breath EXAM: PORTABLE CHEST 1 VIEW COMPARISON:  Previous studies including the examination done earlier today FINDINGS: There is interval placement of endotracheal tube with its tip approximately 5.2 cm above the carina. Enteric tube is noted traversing the esophagus. Transverse diameter of heart is increased. Extensive interstitial and alveolar densities seen in both lungs with interval improvement. There is blunting of both lateral CP angles. Apical pleural densities seen. There is no demonstrable pneumothorax. Tip of PICC line is seen in the superior vena cava. IMPRESSION: There is interval placement of endotracheal tube. Extensive interstitial and alveolar densities in both lungs suggest pulmonary edema or pneumonia. Small bilateral pleural effusions. Electronically Signed   By: Elmer Picker M.D.   On: 05/23/2021 08:51   DG Chest Port 1 View  Result Date: 05/27/2021 CLINICAL DATA:  Shortness of breath EXAM: PORTABLE CHEST 1 VIEW COMPARISON:  Previous studies including the examination of 05/20/2021 FINDINGS: Transverse diameter of heart is increased. Enteric tube is noted traversing the esophagus. Tip of PICC line is seen in the superior vena cava. Diffuse interstitial and alveolar densities seen in both lungs with possible slight worsening. Lateral CP angles are indistinct. There is no pneumothorax. Slightly displaced fracture is seen in the right second rib with no significant change. IMPRESSION: Diffuse interstitial and alveolar densities are noted throughout both lungs suggesting pulmonary edema or pneumonia with possible slight interval worsening. Electronically Signed   By: Elmer Picker M.D.   On: 06/10/2021 08:18   DG CHEST PORT 1 VIEW  Result Date: 05/20/2021 CLINICAL DATA:  71 year old male with shortness of breath.  Status post CABG on 04/30/2021. EXAM: PORTABLE CHEST 1 VIEW COMPARISON:  Portable chest 05/19/2021 and earlier. FINDINGS: Portable AP upright view at 0522 hours. Enteric feeding tube and right PICC line remain in place. Prior CABG. Stable cardiac size and mediastinal contours. Diffuse, coarse bilateral pulmonary interstitial opacity although regressed confluent upper lobe predominant airspace opacity since yesterday. Mildly displaced posterior right 2nd rib fracture redemonstrated. No pneumothorax. No definite pleural effusion. Paucity of bowel gas. IMPRESSION: 1. Improved bilateral ventilation since yesterday although extensive residual coarse pulmonary interstitial opacity, indeterminate for edema versus infection. 2. Stable lines and tubes.  No new cardiopulmonary abnormality. Electronically Signed   By: Genevie Ann M.D.   On: 05/20/2021 06:36    Review of Systems  Unable to perform ROS: Intubated  Blood pressure (!) 88/41, pulse 70, temperature 98.5 F (36.9 C), temperature source Axillary, resp. rate (!) 37, height _0  (1.778 m), weight 84.2 kg, SpO2 98 %. Exam limited due to prone positioning Critically ill appearing, intubated, sedated, proned [place]/AT, ETT in place Rh[place] bilaterally, Pplat>40, tachypneic but synchronous with MV Abd exam limited by positioning Minimal peripheral edema. No cyanosis.  Skin warm, dry, not mottled RASS -5  Assessment/Plan: ARDS with acute hypoxic and hypercapneic respiratory failure Aspiration pneumonitis Severe respiratory acidossi -LTVV, 4-8cc/kg IBW with goal Pplat<30 and DP<15- not meeting goals. -VAP prevention protocl -PAD protocol; goal RASS -4 to -5 -Recommend cannulation for VV ECMO-- Resp score of 2, has preserved cardiac function and otherwise had a good baseline status. Aspiration pneumonitis or pneumonia should be a fully reversible pulmonary process.  -Titrate PEEP & FiO2 per ARDS protocol -starting inhaled EPO --fluid restrictive  management -bicarb gtt to address acidosis short-term -con't broad antibiotics- vanc, meropenem  CAD, s/p 3-vessel CABG -Per cardiology, he has no cardiac contraindications for ECMO.  -con't statin, aspirin -holding Bblocker due to shock  Shock, suspect distributive due to sedation and acidosis Lactic acidosis due to shock -vasopressors to maintain MAP >65 -correct acidosis -serial lactates  Hyponatremia -monitor -eventually needs diuresis  Hyperglycemia -SSI PRN -hold TF for now until pressor requirements improve and he is not being repositioned frequently  Anemia, expected post-op -transfuse for Hb <8 on ECMO or hemodynamically significant bleeding -monitor  This patient is critically ill with multiple organ system failure which requires frequent high complexity decision making, assessment, support, evaluation, and titration of therapies. This was completed through the application of advanced monitoring technologies and extensive interpretation of multiple databases. During this encounter critical care time was devoted to patient care services described in this note for 36 minutes.   Julian Hy Date: 05/31/2021 Time: 11:37 AM

## 2021-05-21 NOTE — Progress Notes (Signed)
Pharmacy Antibiotic Note  Randall Hodges is a 71 y.o. male admitted on 05/07/2021 with pneumonia, ARDS.  Pharmacy has been consulted for vancomycin and zosyn.  Due to worsening status today with rising WBC and persistent infiltrates on CXR, pharmacy asked to broaden to vancomycin + meropenem.  Plan: Vancomycin 750 mg IV q 12 hrs. Meropenem 1g IV q 8 hrs  Height: '5\' 10"'  (177.8 cm) Weight: 84.2 kg (185 lb 10 oz) IBW/kg (Calculated) : 73  Temp (24hrs), Avg:98.3 F (36.8 C), Min:97.5 F (36.4 C), Max:98.6 F (37 C)  Recent Labs  Lab 05/15/21 0117 05/17/21 0413 05/17/21 1733 05/04/2021 0314 05/19/21 0209 05/20/21 0342 06/12/2021 0314 05/27/2021 0746 06/11/2021 0954  WBC 13.0*  --   --  19.4* 18.0* 21.5* 24.2*  --   --   CREATININE 1.16   < > 2.46* 2.11* 1.52* 1.38* 1.37*  --   --   LATICACIDVEN  --   --   --   --   --   --   --  1.9 2.3*   < > = values in this interval not displayed.    Estimated Creatinine Clearance: 51.1 mL/min (A) (by C-G formula based on SCr of 1.37 mg/dL (H)).    Allergies  Allergen Reactions   Amoxicillin-Pot Clavulanate     Per pt report on 08/19/20, makes his urine dark colored   Atorvastatin Other (See Comments)     ( pt states causing runny nose, headaches, issue w/ urination)   Plant Derived Enzymes     Other reaction(s): Unknown   Trichophyton Other (See Comments)    Antimicrobials this admission: cefepime 1/29 >> 1/30 Zosyn 1/30 >> 2/2 Vanc 1/31 >>  Meropenem 2/2 >   Dose adjustments this admission:  Microbiology results: MRSA negative Sputum cx sent  1/31 ESR 55 2/1 covid- negative 2/1 MRSA PCR- negative 2/1 flu negative  Thank you for allowing pharmacy to be a part of this patients care.  [place] Crane, Roylene Reason, BCCP Clinical Pharmacist  06/05/2021 11:15 AM   [facility] pharmacy phone numbers are listed on [email or web address]

## 2021-05-21 NOTE — Procedures (Signed)
Bedside Bronchoscopy Procedure Note Randall Hodges [id number] 1950/11/28  Procedure: Bronchoscopy Indications: Diagnostic evaluation of the airways, Obtain specimens for culture and/or other diagnostic studies, and Remove secretions  Procedure Details: ET Tube Size: ET Tube secured at lip (cm): Bite block in place: Yes In preparation for procedure, Patient hyper-oxygenated with 100 % FiO2 and Saline given via ETT (60 ml) Airway entered and the following bronchi were examined: RUL, RML, RLL, LUL, LLL, and Bronchi.   Bronchoscope removed & patient was left on 100% FIO2.   Evaluation BP (!) 122/52    Pulse 81    Temp (!) 97.5 F (36.4 C) (Axillary)    Resp (!) 30    Ht 5\' 10"  (1.778 m)    Wt 84.2 kg    SpO2 96%    BMI 26.63 kg/m  Breath Sounds:Coarse crackles O2 sats: stable throughout Patient's Current Condition: stable Specimens:  Sent right and left lower lobes.  Complications: No apparent complications Patient did tolerate procedure well.   Joaquina Nissen L 06/11/2021, 9:00 AM

## 2021-05-21 NOTE — Progress Notes (Signed)
° °   °  [address]       [place] [address]             [phone]    Intubated, sedated Stated on V V ECMO this afternoon  BP 124/73    Pulse 81    Temp 97.7 F (36.5 C)    Resp 12    Ht 5\' 10"  (1.778 m)    Wt 84.2 kg    SpO2 99%    BMI 26.63 kg/m     Amiodarone at 30 Norepi down to 8 Vasopressin at 0.04  7.47/44/148  Continue current Rx  Kolbe Delmonaco C. Roxan Hockey, MD [facility] [phone]

## 2021-05-21 NOTE — Progress Notes (Signed)
Patient was placed in supine position per Dr. Lynetta Mare without any complications. ETT was secured with a tube holder at 25 at the lip.

## 2021-05-21 NOTE — Progress Notes (Signed)
Patient was transported to the [place] lab with RN, CATH lab team, ECMO team & 2 RT's without any complications.

## 2021-05-22 ENCOUNTER — Encounter (HOSPITAL_COMMUNITY): Payer: Self-pay | Admitting: Cardiology

## 2021-05-22 ENCOUNTER — Inpatient Hospital Stay (HOSPITAL_COMMUNITY): Payer: PPO

## 2021-05-22 DIAGNOSIS — I249 Acute ischemic heart disease, unspecified: Secondary | ICD-10-CM | POA: Diagnosis not present

## 2021-05-22 DIAGNOSIS — Z515 Encounter for palliative care: Secondary | ICD-10-CM

## 2021-05-22 DIAGNOSIS — J9601 Acute respiratory failure with hypoxia: Secondary | ICD-10-CM | POA: Diagnosis not present

## 2021-05-22 LAB — POCT I-STAT 7, (LYTES, BLD GAS, ICA,H+H)
Acid-Base Excess: 13 mmol/L — ABNORMAL HIGH (ref 0.0–2.0)
Acid-Base Excess: 12 mmol/L — ABNORMAL HIGH (ref 0.0–2.0)
Acid-Base Excess: 13.1 mmol/L — ABNORMAL HIGH (ref 0.0–2.0)
Acid-Base Excess: 11.1 mmol/L — ABNORMAL HIGH (ref 0.0–2.0)
Acid-Base Excess: 6 mmol/L — ABNORMAL HIGH (ref 0.0–2.0)
Acid-Base Excess: 8 mmol/L — ABNORMAL HIGH (ref 0.0–2.0)
Acid-Base Excess: 7 mmol/L — ABNORMAL HIGH (ref 0.0–2.0)
Acid-Base Excess: 8 mmol/L — ABNORMAL HIGH (ref 0.0–2.0)
Bicarbonate: 38 mmol/L — ABNORMAL HIGH (ref 20.0–28.0)
Bicarbonate: 37.2 mmol/L — ABNORMAL HIGH (ref 20.0–28.0)
Bicarbonate: 38.1 mmol/L — ABNORMAL HIGH (ref 20.0–28.0)
Bicarbonate: 35.7 mmol/L — ABNORMAL HIGH (ref 20.0–28.0)
Bicarbonate: 31.4 mmol/L — ABNORMAL HIGH (ref 20.0–28.0)
Bicarbonate: 33 mmol/L — ABNORMAL HIGH (ref 20.0–28.0)
Bicarbonate: 32.7 mmol/L — ABNORMAL HIGH (ref 20.0–28.0)
Bicarbonate: 33.6 mmol/L — ABNORMAL HIGH (ref 20.0–28.0)
Calcium, Ion: 1.13 mmol/L — ABNORMAL LOW (ref 1.15–1.40)
Calcium, Ion: 1.11 mmol/L — ABNORMAL LOW (ref 1.15–1.40)
Calcium, Ion: 1.13 mmol/L — ABNORMAL LOW (ref 1.15–1.40)
Calcium, Ion: 1.14 mmol/L — ABNORMAL LOW (ref 1.15–1.40)
Calcium, Ion: 1.16 mmol/L (ref 1.15–1.40)
Calcium, Ion: 1.16 mmol/L (ref 1.15–1.40)
Calcium, Ion: 1.17 mmol/L (ref 1.15–1.40)
Calcium, Ion: 1.16 mmol/L (ref 1.15–1.40)
HCT: 28 % — ABNORMAL LOW (ref 39.0–52.0)
HCT: 29 % — ABNORMAL LOW (ref 39.0–52.0)
HCT: 30 % — ABNORMAL LOW (ref 39.0–52.0)
HCT: 27 % — ABNORMAL LOW (ref 39.0–52.0)
HCT: 24 % — ABNORMAL LOW (ref 39.0–52.0)
HCT: 27 % — ABNORMAL LOW (ref 39.0–52.0)
HCT: 27 % — ABNORMAL LOW (ref 39.0–52.0)
HCT: 25 % — ABNORMAL LOW (ref 39.0–52.0)
Hemoglobin: 9.5 g/dL — ABNORMAL LOW (ref 13.0–17.0)
Hemoglobin: 9.9 g/dL — ABNORMAL LOW (ref 13.0–17.0)
Hemoglobin: 10.2 g/dL — ABNORMAL LOW (ref 13.0–17.0)
Hemoglobin: 9.2 g/dL — ABNORMAL LOW (ref 13.0–17.0)
Hemoglobin: 8.2 g/dL — ABNORMAL LOW (ref 13.0–17.0)
Hemoglobin: 9.2 g/dL — ABNORMAL LOW (ref 13.0–17.0)
Hemoglobin: 9.2 g/dL — ABNORMAL LOW (ref 13.0–17.0)
Hemoglobin: 8.5 g/dL — ABNORMAL LOW (ref 13.0–17.0)
O2 Saturation: 97 %
O2 Saturation: 97 %
O2 Saturation: 95 %
O2 Saturation: 97 %
O2 Saturation: 96 %
O2 Saturation: 95 %
O2 Saturation: 92 %
O2 Saturation: 95 %
Patient temperature: 98.6
Patient temperature: 98.6
Patient temperature: 100
Patient temperature: 99.6
Patient temperature: 37
Patient temperature: 37
Patient temperature: 36.8
Patient temperature: 36.8
Potassium: 3.4 mmol/L — ABNORMAL LOW (ref 3.5–5.1)
Potassium: 3.4 mmol/L — ABNORMAL LOW (ref 3.5–5.1)
Potassium: 3.3 mmol/L — ABNORMAL LOW (ref 3.5–5.1)
Potassium: 3.6 mmol/L (ref 3.5–5.1)
Potassium: 4.1 mmol/L (ref 3.5–5.1)
Potassium: 4.6 mmol/L (ref 3.5–5.1)
Potassium: 4.5 mmol/L (ref 3.5–5.1)
Potassium: 4.3 mmol/L (ref 3.5–5.1)
Sodium: 131 mmol/L — ABNORMAL LOW (ref 135–145)
Sodium: 132 mmol/L — ABNORMAL LOW (ref 135–145)
Sodium: 133 mmol/L — ABNORMAL LOW (ref 135–145)
Sodium: 133 mmol/L — ABNORMAL LOW (ref 135–145)
Sodium: 138 mmol/L (ref 135–145)
Sodium: 138 mmol/L (ref 135–145)
Sodium: 139 mmol/L (ref 135–145)
Sodium: 139 mmol/L (ref 135–145)
TCO2: 40 mmol/L — ABNORMAL HIGH (ref 22–32)
TCO2: 39 mmol/L — ABNORMAL HIGH (ref 22–32)
TCO2: 40 mmol/L — ABNORMAL HIGH (ref 22–32)
TCO2: 37 mmol/L — ABNORMAL HIGH (ref 22–32)
TCO2: 33 mmol/L — ABNORMAL HIGH (ref 22–32)
TCO2: 34 mmol/L — ABNORMAL HIGH (ref 22–32)
TCO2: 34 mmol/L — ABNORMAL HIGH (ref 22–32)
TCO2: 35 mmol/L — ABNORMAL HIGH (ref 22–32)
pCO2 arterial: 50.3 mmHg — ABNORMAL HIGH (ref 32.0–48.0)
pCO2 arterial: 49 mmHg — ABNORMAL HIGH (ref 32.0–48.0)
pCO2 arterial: 48.1 mmHg — ABNORMAL HIGH (ref 32.0–48.0)
pCO2 arterial: 46.1 mmHg (ref 32.0–48.0)
pCO2 arterial: 47.2 mmHg (ref 32.0–48.0)
pCO2 arterial: 49 mmHg — ABNORMAL HIGH (ref 32.0–48.0)
pCO2 arterial: 52 mmHg — ABNORMAL HIGH (ref 32.0–48.0)
pCO2 arterial: 52 mmHg — ABNORMAL HIGH (ref 32.0–48.0)
pH, Arterial: 7.487 — ABNORMAL HIGH (ref 7.350–7.450)
pH, Arterial: 7.489 — ABNORMAL HIGH (ref 7.350–7.450)
pH, Arterial: 7.506 — ABNORMAL HIGH (ref 7.350–7.450)
pH, Arterial: 7.497 — ABNORMAL HIGH (ref 7.350–7.450)
pH, Arterial: 7.432 (ref 7.350–7.450)
pH, Arterial: 7.437 (ref 7.350–7.450)
pH, Arterial: 7.406 (ref 7.350–7.450)
pH, Arterial: 7.418 (ref 7.350–7.450)
pO2, Arterial: 86 mmHg (ref 83.0–108.0)
pO2, Arterial: 83 mmHg (ref 83.0–108.0)
pO2, Arterial: 69 mmHg — ABNORMAL LOW (ref 83.0–108.0)
pO2, Arterial: 85 mmHg (ref 83.0–108.0)
pO2, Arterial: 81 mmHg — ABNORMAL LOW (ref 83.0–108.0)
pO2, Arterial: 76 mmHg — ABNORMAL LOW (ref 83.0–108.0)
pO2, Arterial: 65 mmHg — ABNORMAL LOW (ref 83.0–108.0)
pO2, Arterial: 77 mmHg — ABNORMAL LOW (ref 83.0–108.0)

## 2021-05-22 LAB — HEPATIC FUNCTION PANEL
ALT: 479 U/L — ABNORMAL HIGH (ref 0–44)
AST: 1035 U/L — ABNORMAL HIGH (ref 15–41)
Albumin: 1.9 g/dL — ABNORMAL LOW (ref 3.5–5.0)
Alkaline Phosphatase: 150 U/L — ABNORMAL HIGH (ref 38–126)
Bilirubin, Direct: 0.5 mg/dL — ABNORMAL HIGH (ref 0.0–0.2)
Indirect Bilirubin: 0.7 mg/dL (ref 0.3–0.9)
Total Bilirubin: 1.2 mg/dL (ref 0.3–1.2)
Total Protein: 4.4 g/dL — ABNORMAL LOW (ref 6.5–8.1)

## 2021-05-22 LAB — PREPARE FRESH FROZEN PLASMA

## 2021-05-22 LAB — LACTATE DEHYDROGENASE: LDH: 615 U/L — ABNORMAL HIGH (ref 98–192)

## 2021-05-22 LAB — BLOOD GAS, ARTERIAL
Acid-base deficit: 0.6 mmol/L (ref 0.0–2.0)
Bicarbonate: 30.3 mmol/L — ABNORMAL HIGH (ref 20.0–28.0)
FIO2: 90
O2 Saturation: 96.1 %
Patient temperature: 37
pCO2 arterial: >120 mmHg (ref 32.0–48.0)
pH, Arterial: 6.992 — CL (ref 7.350–7.450)
pO2, Arterial: 134 mmHg — ABNORMAL HIGH (ref 83.0–108.0)

## 2021-05-22 LAB — BASIC METABOLIC PANEL
Anion gap: 9 (ref 5–15)
Anion gap: 7 (ref 5–15)
BUN: 41 mg/dL — ABNORMAL HIGH (ref 8–23)
BUN: 46 mg/dL — ABNORMAL HIGH (ref 8–23)
CO2: 32 mmol/L (ref 22–32)
CO2: 30 mmol/L (ref 22–32)
Calcium: 8 mg/dL — ABNORMAL LOW (ref 8.9–10.3)
Calcium: 7.9 mg/dL — ABNORMAL LOW (ref 8.9–10.3)
Chloride: 98 mmol/L (ref 98–111)
Chloride: 99 mmol/L (ref 98–111)
Creatinine, Ser: 1.69 mg/dL — ABNORMAL HIGH (ref 0.61–1.24)
Creatinine, Ser: 1.51 mg/dL — ABNORMAL HIGH (ref 0.61–1.24)
GFR, Estimated: 43 mL/min — ABNORMAL LOW (ref >60)
GFR, Estimated: 49 mL/min — ABNORMAL LOW (ref >60)
Glucose, Bld: 143 mg/dL — ABNORMAL HIGH (ref 70–99)
Glucose, Bld: 190 mg/dL — ABNORMAL HIGH (ref 70–99)
Potassium: 4.2 mmol/L (ref 3.5–5.1)
Potassium: 4.5 mmol/L (ref 3.5–5.1)
Sodium: 139 mmol/L (ref 135–145)
Sodium: 136 mmol/L (ref 135–145)

## 2021-05-22 LAB — SEDIMENTATION RATE: Sed Rate: 60 mm/hr — ABNORMAL HIGH (ref 0–16)

## 2021-05-22 LAB — GLUCOSE, CAPILLARY
Glucose-Capillary: 150 mg/dL — ABNORMAL HIGH (ref 70–99)
Glucose-Capillary: 153 mg/dL — ABNORMAL HIGH (ref 70–99)
Glucose-Capillary: 162 mg/dL — ABNORMAL HIGH (ref 70–99)
Glucose-Capillary: 171 mg/dL — ABNORMAL HIGH (ref 70–99)
Glucose-Capillary: 173 mg/dL — ABNORMAL HIGH (ref 70–99)
Glucose-Capillary: 182 mg/dL — ABNORMAL HIGH (ref 70–99)
Glucose-Capillary: 183 mg/dL — ABNORMAL HIGH (ref 70–99)

## 2021-05-22 LAB — PROTIME-INR
INR: 2.3 — ABNORMAL HIGH (ref 0.8–1.2)
Prothrombin Time: 25.1 seconds — ABNORMAL HIGH (ref 11.4–15.2)

## 2021-05-22 LAB — BPAM FFP
Blood Product Expiration Date: 202302072359
Blood Product Expiration Date: 202302072359
ISSUE DATE / TIME: 202302021339
ISSUE DATE / TIME: 202302021400
Unit Type and Rh: 5100
Unit Type and Rh: 5100

## 2021-05-22 LAB — CBC
HCT: 24.3 % — ABNORMAL LOW (ref 39.0–52.0)
HCT: 24.4 % — ABNORMAL LOW (ref 39.0–52.0)
Hemoglobin: 7.7 g/dL — ABNORMAL LOW (ref 13.0–17.0)
Hemoglobin: 8.1 g/dL — ABNORMAL LOW (ref 13.0–17.0)
MCH: 28.5 pg (ref 26.0–34.0)
MCH: 29.5 pg (ref 26.0–34.0)
MCHC: 31.7 g/dL (ref 30.0–36.0)
MCHC: 33.2 g/dL (ref 30.0–36.0)
MCV: 90 fL (ref 80.0–100.0)
MCV: 88.7 fL (ref 80.0–100.0)
Platelets: 179 10*3/uL (ref 150–400)
Platelets: 178 10*3/uL (ref 150–400)
RBC: 2.7 MIL/uL — ABNORMAL LOW (ref 4.22–5.81)
RBC: 2.75 MIL/uL — ABNORMAL LOW (ref 4.22–5.81)
RDW: 15.2 % (ref 11.5–15.5)
RDW: 15.1 % (ref 11.5–15.5)
WBC: 15.9 10*3/uL — ABNORMAL HIGH (ref 4.0–10.5)
WBC: 13.1 10*3/uL — ABNORMAL HIGH (ref 4.0–10.5)
nRBC: 0 % (ref 0.0–0.2)
nRBC: 0 % (ref 0.0–0.2)

## 2021-05-22 LAB — COOXEMETRY PANEL
Carboxyhemoglobin: 1.3 % (ref 0.5–1.5)
Methemoglobin: 1 % (ref 0.0–1.5)
O2 Saturation: 58.2 %
Total hemoglobin: 10.5 g/dL — ABNORMAL LOW (ref 12.0–16.0)

## 2021-05-22 LAB — LACTIC ACID, PLASMA: Lactic Acid, Venous: 1.7 mmol/L (ref 0.5–1.9)

## 2021-05-22 LAB — APTT
aPTT: 59 seconds — ABNORMAL HIGH (ref 24–36)
aPTT: 60 seconds — ABNORMAL HIGH (ref 24–36)
aPTT: 58 seconds — ABNORMAL HIGH (ref 24–36)

## 2021-05-22 LAB — HEPARIN LEVEL (UNFRACTIONATED)
Heparin Unfractionated: 0.13 IU/mL — ABNORMAL LOW (ref 0.30–0.70)
Heparin Unfractionated: 0.1 IU/mL — ABNORMAL LOW (ref 0.30–0.70)

## 2021-05-22 LAB — FIBRINOGEN: Fibrinogen: 626 mg/dL — ABNORMAL HIGH (ref 210–475)

## 2021-05-22 LAB — MAGNESIUM: Magnesium: 1.8 mg/dL (ref 1.7–2.4)

## 2021-05-22 LAB — CYTOLOGY - NON PAP

## 2021-05-22 LAB — PROCALCITONIN: Procalcitonin: 1.64 ng/mL

## 2021-05-22 MED ORDER — FENTANYL 2500MCG IN NS 250ML (10MCG/ML) PREMIX INFUSION
0.0000 ug/h | INTRAVENOUS | Status: DC
  Administered 2021-05-22: 50 ug/h via INTRAVENOUS
  Administered 2021-05-23 – 2021-05-24 (×2): 100 ug/h via INTRAVENOUS
  Administered 2021-05-25 (×2): 200 ug/h via INTRAVENOUS
  Administered 2021-05-26: 100 ug/h via INTRAVENOUS
  Administered 2021-05-27: 250 ug/h via INTRAVENOUS
  Filled 2021-05-22 (×8): qty 250

## 2021-05-22 MED ORDER — AMIODARONE LOAD VIA INFUSION
150.0000 mg | Freq: Once | INTRAVENOUS | Status: AC
  Administered 2021-05-22: 150 mg via INTRAVENOUS
  Filled 2021-05-22: qty 83.34

## 2021-05-22 MED ORDER — AMIODARONE HCL IN DEXTROSE 360-4.14 MG/200ML-% IV SOLN
30.0000 mg/h | INTRAVENOUS | Status: DC
  Administered 2021-05-23 – 2021-05-26 (×8): 30 mg/h via INTRAVENOUS
  Administered 2021-05-27: 12:00:00 60 mg/h via INTRAVENOUS
  Administered 2021-05-27: 08:00:00 30 mg/h via INTRAVENOUS
  Administered 2021-05-27 – 2021-05-29 (×6): 60 mg/h via INTRAVENOUS
  Administered 2021-05-29 – 2021-05-30 (×2): 30 mg/h via INTRAVENOUS
  Filled 2021-05-22 (×19): qty 200

## 2021-05-22 MED ORDER — FUROSEMIDE 10 MG/ML IJ SOLN: 60.0000 mg | Freq: Two times a day (BID) | INTRAMUSCULAR | Status: DC

## 2021-05-22 MED ORDER — MAGNESIUM SULFATE 2 GM/50ML IV SOLN
2.0000 g | Freq: Once | INTRAVENOUS | Status: AC
  Administered 2021-05-22: 2 g via INTRAVENOUS
  Filled 2021-05-22: qty 50

## 2021-05-22 MED ORDER — AMIODARONE HCL IN DEXTROSE 360-4.14 MG/200ML-% IV SOLN
60.0000 mg/h | INTRAVENOUS | Status: DC
  Administered 2021-05-22 – 2021-05-23 (×2): 60 mg/h via INTRAVENOUS

## 2021-05-22 MED ORDER — PROSOURCE TF PO LIQD
45.0000 mL | Freq: Three times a day (TID) | ORAL | Status: DC
  Administered 2021-05-22 – 2021-05-31 (×29): 45 mL
  Filled 2021-05-22 (×29): qty 45

## 2021-05-22 MED ORDER — METHYLPREDNISOLONE SODIUM SUCC 40 MG IJ SOLR
40.0000 mg | Freq: Two times a day (BID) | INTRAMUSCULAR | Status: DC
  Administered 2021-05-22 – 2021-05-29 (×15): 40 mg via INTRAVENOUS
  Filled 2021-05-22 (×15): qty 1

## 2021-05-22 MED ORDER — CHLORHEXIDINE GLUCONATE CLOTH 2 % EX PADS
6.0000 | MEDICATED_PAD | Freq: Every day | CUTANEOUS | Status: DC
  Administered 2021-05-23 – 2021-06-16 (×28): 6 via TOPICAL

## 2021-05-22 MED ORDER — POTASSIUM CHLORIDE 10 MEQ/50ML IV SOLN
10.0000 meq | INTRAVENOUS | Status: AC
  Administered 2021-05-22 (×3): 10 meq via INTRAVENOUS
  Filled 2021-05-22 (×3): qty 50

## 2021-05-22 MED ORDER — FUROSEMIDE 10 MG/ML IJ SOLN
60.0000 mg | Freq: Two times a day (BID) | INTRAMUSCULAR | Status: AC
  Administered 2021-05-22 (×2): 60 mg via INTRAVENOUS
  Filled 2021-05-22 (×2): qty 6

## 2021-05-22 NOTE — Progress Notes (Signed)
Patient ID: BLAS RICHES, male   DOB: November 12, 1950, 71 y.o.   MRN: [id number]  Extracorporeal support note   ECLS support day: Indication: ARDS  Configuration: VV  Drainage cannula: Crescent R IJ Return cannula: Crescent R IJ  Pump speed: 2880 rpm Pump flow: 3.6 L/min Pump used: Cardiohelp  Sweep gas: 4  Circuit check: No thrombus  Anticoagulant: Bivalirudin  Changes in support: No changes today, continue current support  Anticipated goals/duration of support: Wean to discontinuation  Loralie Champagne 05/22/2021 7:48 AM

## 2021-05-22 NOTE — Progress Notes (Signed)
NAME:  Randall Hodges, MRN:  [id number], DOB:  [birth year], LOS: 56 ADMISSION DATE:  05/17/2021, CONSULTATION DATE: 05/02/2021 REFERRING MD:  Dahlia Byes, MD, CHIEF COMPLAINT: Increasing shortness of breath  History of Present Illness:  71 year old male with coronary artery disease who initially presented with chest pain, noted to have multivessel coronary artery disease, he underwent CABG x3 on 04/1818 23, course was complicated with atrial fibrillation, frequent hiccups and aspiration pneumonia leading to hypoxia and increasing oxygen requirement. PCCM was consulted for evaluation and help with management  Patient stated hiccups are better but continued complain of shortness of breath, cough unable to bring up phlegm.  Denies chest pain or palpitation During my evaluation patient is on high flow nasal cannula oxygen at 12 L and he is requiring low-dose Levophed to maintain MAP above 65.  Of note patient had echocardiogram repeated today, consistent with constrictive picture.  Pertinent  Medical History   Past Medical History:  Diagnosis Date   Allergic rhinitis    Prostate cancer ([place])     Significant Hospital Events: Including procedures, antibiotic start and stop dates in addition to other pertinent events   1/18 CABG 1/30 PCCM consult, Worsening O2 needs, likely aspiration, new pressor need 2/2 intubation, placed on VV ECMO  Interim History / Subjective:  Markedly improved after cannulation 4LPM sweep, gas good On bival, precedex Needs lots of Fent pushes Plat 35, PEEP 10, on PC 15/10, TV 300s  Objective   Blood pressure 113/60, pulse 76, temperature 98.4 F (36.9 C), temperature source Bladder, resp. rate 16, height _0  (1.778 m), weight 80.8 kg, SpO2 100 %. CVP:  [11 mmHg-25 mmHg] 20 mmHg  Vent Mode: PCV FiO2 (%):  [60 %-100 %] 60 % Set Rate:  [12 bmp-34 bmp] 12 bmp Vt Set:  [420 mL-430 mL] 430 mL PEEP:  [10 cmH20-16 cmH20] 10 cmH20 Plateau Pressure:  [24  cmH20-47 cmH20] 25 cmH20   Intake/Output Summary (Last 24 hours) at 05/22/2021 0715 Last data filed at 05/22/2021 0700 Gross per 24 hour  Intake 5448.09 ml  Output 2690 ml  Net 2758.09 ml    Filed Weights   05/19/21 0500 05/20/21 0500 05/22/21 0300  Weight: 86.6 kg 84.2 kg 80.8 kg    Examination: Elderly man on vent Pupils small, equal, reactive Sternotomy scar and mediastinal drain scars well healed VV cannula RIJ, good color change, delta P look okay, no clots in circuit Triggering vent, lung sounds diminished on R, rhoncorous on left Minimal tracheal secretions Moves all 4 ext with sedation wean per nursing  Current drips: levo, vaso, bival, amio, precedex CVP 12  Lab review Shock liver stable Cr stable LDH 1400>>615 CBG ok Pct 1.64 from 0.74, on vanc/mero WBC stable PTT 59 INR 2.3 Fibrinogen 588>>626 Hgb stable 7.7 Plts okay [place] 58%  CXR personally reviewed: dense RUL infiltrate combined with bilateral edema vs. Less dense airspace disease  Resolved Hospital Problem list   Hiccups postop  Assessment & Plan:  CAD with unstable angina s/p CABGx3 1/18 (LIMA-LAD, SVG-OM, SVG-left PDA) Postop recurrent shock question constrictive pericaritis type physiology vs. Septic Presumed aspiration pneumonitis vs. HCAP culminating in severe ARDS with inability to ventilate along with high Aa gradient s/p VV ECMO cannulation 05/21/22.  CXR deteriorated between 1/26 and 1/29. Need for [facility] for ECMO Atypical aflutter on amio Baseline fibrotic lung disease with preserved PFTs 2019, question of sarcoid- lost to f/u, see Dr. Matilde Bash note 08/15/17 AKI- multifactorial (sepsis, poor cardiac outflow, shock) Bilateral  effusions- post CABG, R>L on bedside US, do not think would help vent wean at this time to drain  - Diuresis, flow, and sweep wean per CHF team - Usual hemolysis panel monitoring - Bival with PharmD aPTT monitoring and adjustment - DC bicarb gtt - LTVV, doing well with PC  so far - Add fent gtt given frequency of pushes - Continue broad spectrum abx, f/u BAL data - Going to add some steroids given previous fibrotic lung disease, rapidity of progression; check and trend sed rate; do not think this is c/w amio lung toxicity but may need to keep in differential if languish, fine to continue for now.  If BAL returns with causative organism we can rapidly wean this. - PT/OT, TF as ordered  Best Practice (right click and "Reselect all SmartList Selections" daily)   Diet/type: Vital @ 87m/hr DVT prophylaxis: Bival CBG: yes, SSI GI prophylaxis: PPI Lines: L [place] TLC, RIJ  Foley:  Yes, keep Code Status:  full code Last date of multidisciplinary goals of care discussion [2/2 by Dr. CTacy Learn 85 min cc time DErskine EmeryMD PCCM

## 2021-05-22 NOTE — Progress Notes (Signed)
ANTICOAGULATION CONSULT NOTE -  Pharmacy Consult for Bivalirudin Indication:  ECMO  Allergies  Allergen Reactions   Amoxicillin-Pot Clavulanate     Per pt report on 08/19/20, makes his urine dark colored   Atorvastatin Other (See Comments)     ( pt states causing runny nose, headaches, issue w/ urination)   Plant Derived Enzymes     Other reaction(s): Unknown   Trichophyton Other (See Comments)    Patient Measurements: Height: 5\' 10"  (177.8 cm) Weight: 80.8 kg (178 lb 2.1 oz) IBW/kg (Calculated) : 73  Vital Signs: Temp: 98.6 F (37 C) (02/03 1200) Temp Source: Bladder (02/03 1200) BP: 89/54 (02/03 1400) Pulse Rate: 75 (02/03 1430)  Labs: Recent Labs    06/13/2021 0314 05/31/2021 1313 05/23/2021 1711 05/20/2021 1803 05/24/2021 2300 05/22/21 0300 05/22/21 0303 05/22/21 1122  HGB 9.9*   8.6*   < > 7.6*   < > 7.8* 7.7* 8.2*  --   HCT 29.0*   25.6*   < > 23.8*   < > 23.2* 24.3* 24.0*  --   PLT 269  --  185  --  181 179  --   --   APTT  --    < > 166*  --  62* 59*  --  60*  LABPROT  --   --  22.5*  --   --  25.1*  --   --   INR  --   --  2.0*  --   --  2.3*  --   --   HEPARINUNFRC  --    < > 0.68  --  0.18* 0.13*  --  0.10*  CREATININE 1.37*  --  1.87*  --   --  1.69*  --   --    < > = values in this interval not displayed.     Estimated Creatinine Clearance: 41.4 mL/min (A) (by C-G formula based on SCr of 1.69 mg/dL (H)).   Medical History: Past Medical History:  Diagnosis Date   Allergic rhinitis    Prostate cancer ([place])     Medications:  Infusions:   sodium chloride 10 mL/hr at 05/22/21 1400   albumin human     amiodarone 30 mg/hr (05/22/21 1400)   bivalirudin (ANGIOMAX) infusion 0.5 mg/mL (Non-ACS indications) 0.03 mg/kg/hr (05/22/21 1400)   dexmedetomidine (PRECEDEX) IV infusion 0.8 mcg/kg/hr (05/22/21 1400)   feeding supplement (VITAL 1.5 CAL) 1,000 mL (06/08/2021 1748)   fentaNYL infusion INTRAVENOUS 75 mcg/hr (05/22/21 1400)   meropenem (MERREM) IV Stopped  (05/22/21 1253)   norepinephrine (LEVOPHED) Adult infusion 6 mcg/min (05/22/21 1400)   vancomycin Stopped (05/22/21 1219)   vasopressin 0.03 Units/min (05/22/21 1400)    Assessment: 71 year old male with coronary artery disease who initially presented with chest pain, noted to have multivessel coronary artery disease, he underwent CABG x3 on 04/1818 23, course was complicated with atrial fibrillation, frequent hiccups and aspiration pneumonia leading to hypoxia and increasing oxygen requirement. Patient now requiring VV ECMO. Pharmacy consulted for bivalirudin IV.   Aptt continues to be at goal at 60s on bival. Hgb 7-8s, plt 179. LDH coming down to 615, fibrinogen 626.   Goal of Therapy:  aPTT 50-80 seconds Monitor platelets by anticoagulation protocol: Yes   Plan:  Will start bivalirudin at 0.03 mg/kg/hr (5.1 ml/hr) Aptt has been stable will change to q12 hour checks  Thank you for allowing pharmacy to be a part of this patients care.  Erin Hearing PharmD., BCPS Clinical Pharmacist 05/22/2021 2:51 PM

## 2021-05-22 NOTE — Progress Notes (Signed)
ANTICOAGULATION CONSULT NOTE -  Pharmacy Consult for Bivalirudin Indication:  ECMO  Allergies  Allergen Reactions   Amoxicillin-Pot Clavulanate     Per pt report on 08/19/20, makes his urine dark colored   Atorvastatin Other (See Comments)     ( pt states causing runny nose, headaches, issue w/ urination)   Plant Derived Enzymes     Other reaction(s): Unknown   Trichophyton Other (See Comments)    Patient Measurements: Height: 5\' 10"  (177.8 cm) Weight: 80.8 kg (178 lb 2.1 oz) IBW/kg (Calculated) : 73  Vital Signs: Temp: 98.4 F (36.9 C) (02/03 1715) Temp Source: Bladder (02/03 1600) BP: 96/59 (02/03 1700) Pulse Rate: 75 (02/03 1715)  Labs: Recent Labs    06/02/2021 1711 06/02/2021 1803 06/11/2021 2300 05/22/21 0300 05/22/21 0303 05/22/21 1122 05/22/21 1125 05/22/21 1553 05/22/21 1555  HGB 7.6*   < > 7.8* 7.7*   < >  --  9.2* 8.1* 9.2*  HCT 23.8*   < > 23.2* 24.3*   < >  --  27.0* 24.4* 27.0*  PLT 185  --  181 179  --   --   --  178  --   APTT 166*  --  62* 59*  --  60*  --  58*  --   LABPROT 22.5*  --   --  25.1*  --   --   --   --   --   INR 2.0*  --   --  2.3*  --   --   --   --   --   HEPARINUNFRC 0.68  --  0.18* 0.13*  --  0.10*  --   --   --   CREATININE 1.87*  --   --  1.69*  --   --   --  1.51*  --    < > = values in this interval not displayed.     Estimated Creatinine Clearance: 46.3 mL/min (A) (by C-G formula based on SCr of 1.51 mg/dL (H)).   Medical History: Past Medical History:  Diagnosis Date   Allergic rhinitis    Prostate cancer ([place])     Medications:  Infusions:   sodium chloride 10 mL/hr at 05/22/21 1700   albumin human     amiodarone 30 mg/hr (05/22/21 1700)   bivalirudin (ANGIOMAX) infusion 0.5 mg/mL (Non-ACS indications) 0.03 mg/kg/hr (05/22/21 1700)   dexmedetomidine (PRECEDEX) IV infusion 0.8 mcg/kg/hr (05/22/21 1700)   feeding supplement (VITAL 1.5 CAL) 1,000 mL (05/22/21 1130)   fentaNYL infusion INTRAVENOUS 100 mcg/hr (05/22/21  1700)   meropenem (MERREM) IV Stopped (05/22/21 1253)   norepinephrine (LEVOPHED) Adult infusion 10 mcg/min (05/22/21 1700)   vancomycin Stopped (05/22/21 1219)   vasopressin 0.03 Units/min (05/22/21 1700)    Assessment: 71 year old male with coronary artery disease who initially presented with chest pain, noted to have multivessel coronary artery disease, he underwent CABG x3 on 04/1818 23, course was complicated with atrial fibrillation, frequent hiccups and aspiration pneumonia leading to hypoxia and increasing oxygen requirement. Patient now requiring VV ECMO. Pharmacy consulted for bivalirudin IV.   Aptt 58 sec (on bival 0.03 mg/kg/hr)  Goal of Therapy:  aPTT 50-80 seconds Monitor platelets by anticoagulation protocol: Yes   Plan:  Continue bivalirudin at 0.03 mg/kg/hr (5.1 ml/hr) Aptt has been stable will change to q12 hour checks  Thank you for allowing pharmacy to be a part of this patients care.  [name], PharmD Clinical Pharmacist  Please check AMION for all [address]  numbers After 10:00 PM, call [place] [phone]

## 2021-05-22 NOTE — Progress Notes (Signed)
ANTICOAGULATION CONSULT NOTE - Follow Up Consult  Pharmacy Consult for bivalirudin Indication:  ECMO  Labs: Recent Labs    05/20/21 0342 05/20/21 1719 05/29/2021 0314 06/12/2021 1313 05/20/2021 1711 06/06/2021 1803 06/15/2021 1935 06/03/2021 2300  HGB 9.2*   < > 9.9*   8.6*   < > 7.6* 8.8* 8.5* 7.8*  HCT 26.7*   < > 29.0*   25.6*   < > 23.8* 26.0* 25.0* 23.2*  PLT 287  --  269  --  185  --   --  181  APTT  --   --   --   --  166*  --   --  62*  LABPROT  --   --   --   --  22.5*  --   --   --   INR  --   --   --   --  2.0*  --   --   --   HEPARINUNFRC  --   --   --   --  0.68  --   --  0.18*  CREATININE 1.38*  --  1.37*  --  1.87*  --   --   --    < > = values in this interval not displayed.    Assessment/Plan:  71yo male therapeutic on bivalirudin with initial dosing after starting ECMO. Will continue infusion at current rate of 0.03 mg/kg/hr and confirm stable with am labs.   Rogue Bussing 05/22/2021,12:27 AM

## 2021-05-22 NOTE — Progress Notes (Signed)
Patient ID: [name], male   DOB: 04-13-1951, 71 y.o.   MRN: [id number]     Progress Note from the Palliative Medicine Team at [facility]   Patient Name: Randall Hodges        Date: 05/22/2021 DOB: 1950/04/20  Age: 71 y.o. MRN#: [id number] Attending Physician: Dahlia Byes, MD Primary Care Physician: [name], MD Admit Date: 05/04/2021   Received consult for palliative medicine intervention for goals of care.  Medical records reviewed.  Patient currently intubated and started on VV ECMO yesterday.  No family at bedside, rounded several times today.  This NP will attempt to meet with family on Sunday.  Discussed with bedside RN  No charge  Wadie Lessen NP  Palliative Medicine Team Team Phone # [phone] Pager [phone]

## 2021-05-22 NOTE — Progress Notes (Signed)
eLink Physician-Brief Progress Note Patient Name: Randall Hodges DOB: 12-11-1950 MRN: [id number]   Date of Service  05/22/2021  HPI/Events of Note  Patient having watery diarrhea, she is on ECMO.  eICU Interventions  Flexiseal ordered.        Kerry Kass Anahis Furgeson 05/22/2021, 4:27 AM

## 2021-05-22 NOTE — Progress Notes (Addendum)
[address]       [place] [address]             [phone]     16 Days Post-op CABG x 3   1 Day Post-Op Procedure(s) (LRB): ECMO CANNULATION (Bilateral) Subjective:   Respiratory status deteriorated further yesterday--> bronch with Cx's--> intubated, remained hypercarbic--> V-V ECMO initiated yesterday afternoon.    Sedated with Precedix and Fentanyl infusions  Remains on Amiodarone, Levophed and vasopressin  TF infusing at 60ml/min  CoOx 58   Objective: Vital signs in last 24 hours: Temp:  [97.3 F (36.3 C)-98.6 F (37 C)] 98.6 F (37 C) (02/03 0800) Pulse Rate:  [69-100] 79 (02/03 0900) Cardiac Rhythm: Normal sinus rhythm (02/03 0800) Resp:  [0-37] 17 (02/03 0900) BP: (88-169)/(21-73) 132/60 (02/03 0800) SpO2:  [90 %-100 %] 97 % (02/03 0900) Arterial Line BP: (90-186)/(40-72) 135/54 (02/03 0900) FiO2 (%):  [40 %-100 %] 40 % (02/03 0800) Weight:  [80.8 kg] 80.8 kg (02/03 0300) CVP:  [7 mmHg-25 mmHg] 14 mmHg  Intake/Output from previous day: 02/02 0701 - 02/03 0700 In: 5448.1 [I.V.:2666.7; Blood:315; NG/GT:1466; IV Piggyback:930.4] Out: 2690 [Urine:1940; Stool:700; Blood:50] Intake/Output this shift: Total I/O In: 341.3 [I.V.:180.8; NG/GT:120; IV Piggyback:40.5] Out: 475 [Urine:475]  General appearance: sedated with Precedex and Fentanyl Neurologic: unable to assess due to sedation, reported to MAE with sedation off. Heart: SR  Lungs: breath sounds are coarse but better air movement bilat.   CXR with minimal change from yesterday.  ABG: 7.43/47/81/33,  BE 6 (Bicarb drip discontinued). Abdomen: soft, no apparent tenderness, active BS and having loose stools. FlexSeal in place. Extremities: 1+ LE edema. The RLE EVH incision is intact and dry.  Wound: the sternotomy incision remains well approximated and dry. Large-bore RIJ line is hemostatic.   Lab Results: Recent Labs    06/11/2021 2300 05/22/21 0300 05/22/21 0303  WBC 16.3* 15.9*   --   HGB 7.8* 7.7* 8.2*  HCT 23.2* 24.3* 24.0*  PLT 181 179  --     BMET:  Recent Labs    06/11/2021 1711 05/24/2021 1803 05/22/21 0300 05/22/21 0303  NA 138   < > 139 138  K 4.6   < > 4.2 4.1  CL 95*  --  98  --   CO2 31  --  32  --   GLUCOSE 117*  --  143*  --   BUN 39*  --  41*  --   CREATININE 1.87*  --  1.69*  --   CALCIUM 8.1*  --  8.0*  --    < > = values in this interval not displayed.     PT/INR:  Recent Labs    05/22/21 0300  LABPROT 25.1*  INR 2.3*   ABG    Component Value Date/Time   PHART 7.432 05/22/2021 0303   HCO3 31.4 (H) 05/22/2021 0303   TCO2 33 (H) 05/22/2021 0303   ACIDBASEDEF 0.6 05/28/2021 0950   O2SAT 96.0 05/22/2021 0303   CBG (last 3)  Recent Labs    06/09/2021 2300 05/22/21 0300 05/22/21 0736  GLUCAP 182* 150* 153*    RIGHT/LEFT HEART CATH AND CORONARY ANGIOGRAPHY   Conclusion      Right Heart: Simultaneous RV/LV tracings were obtained. Difficult to interpret due to atrial fibrillation. There was some suggestion of discordance (ventricular interdependence) but not clear.   1. RA pressure increased, PCWP normal range.  2. Equalization of diastolic pressures (RA, PCWP, LVEDP,  RVEDP).  3. Preserved cardiac output.  4. Simultaneous RV/LV tracings were obtained.  Difficult to interpret due to atrial fibrillation.  There was some suggestion of discordance (ventricular interdependence) but not clear.        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  Simultaneous RV/LV tracings are not definitive of ventricular interdependence, however.      Assessment/Plan: S/P Procedure(s) (LRB): ECMO CANNULATION (Bilateral)  -POD-16 CABG x 3 presenting with acute STEMI . EF 45% with global hypokinesis on echo 1/25, small pericardial effusion. 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 Currently not a candidate for surgical intervention.  On colchicine.  -PULM-  Respiratory failure due to bilateral aspiration PNA. Had bronch after intubation yesterday with Cx's (pending but no organisms seen on Gm stain).  On meropenem and vanc. Repeat COVID test and resp panel negative. Hypoxia and acidosis corrected with V-V ECMO. Appreciate mgt by HF and CCM.  -Paroxysmal a-fib / a-flutter- maintaining SR on amiodarone infusion at 0.5mg /min.   On Angiomax for ECMO.  -GI-  Currently NPO, supporting nutrition with TF via CorTrak at goal rate of 56ml/hr. Passing stool, has FlexSeal in place  -Acute renal insufficiency- Creat improved, UO acceptable. Lasix added today   LOS: 17 days    Antony Odea, PA-C [phone] 05/22/2021  General marked improvement on VV ECMO Mixed venous sats > 70% CXR with consolidation RUL from aspiration on vanc/ meropenum Vital TF at 60.hr Bival anticoagulation @ .03 Appreciate ECMO team response and excellent care  patient examined and medical record reviewed,agree with above note. Dahlia Byes 05/22/2021

## 2021-05-22 NOTE — Evaluation (Signed)
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 ETT 1/18-1/18. 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 Cortrak 1/30. 2/2  VB ECMO initated intubated [name]   Clinical Impression   Pt on caseload with medical setback with d/c orders 1/30 with reorder 2/2. Pt with day 1 therapy evaluation on ECMO. Pt with sedation turned back 50% by ECMO RN staff. Pt currently with sustaining arousal. Pt moving arms but not to commands. OT to continue to follow. Recommendation CIR at this time pending progress in recovery.       Recommendations for follow up therapy are one component of a multi-disciplinary discharge planning process, led by the attending physician.  Recommendations may be updated based on patient status, additional functional criteria and insurance authorization.   Follow Up Recommendations  Acute inpatient rehab (3hours/day)    Assistance Recommended at Discharge Frequent or constant Supervision/Assistance  Patient can return home with the following A lot of help with walking and/or transfers    Functional Status Assessment  Patient has had a recent decline in their functional status and demonstrates the ability to make significant improvements in function in a reasonable and predictable amount of time.  Equipment Recommendations  Other (comment) (TBA)    Recommendations for Other Services       Precautions / Restrictions Precautions Precautions: Fall;Sternal Precaution Comments: ECMO, cortrak, foley, aline sternal Restrictions Weight Bearing Restrictions: Yes (sternal precautions)      Mobility  Bed Mobility Overal bed mobility: Needs Assistance             General bed mobility comments: in chair position on arrival but unable sustain arousal with mutliple attempts. Sedation turned back by ECMO staff    Transfers                          Balance                                           ADL either performed or assessed with clinical judgement   ADL Overall ADL's : Needs assistance/impaired                                       General ADL Comments: total (A) for all adls at this time     Vision         Perception     Praxis      Pertinent Vitals/Pain Pain Assessment Pain Assessment: CPOT     Hand Dominance Right   Extremity/Trunk Assessment Upper Extremity Assessment Upper Extremity Assessment: Difficult to assess due to impaired cognition   Lower Extremity Assessment Lower Extremity Assessment: Difficult to assess due to impaired cognition       Communication Communication Communication: No difficulties   Cognition Arousal/Alertness: Lethargic   Overall Cognitive Status: Difficult to assess  General Comments  vent  PVR 40% FIO2 Peep 15, ECMO NE 4    Exercises     Shoulder Instructions      Home Living Family/patient expects to be discharged to:: Private residence Living Arrangements: Spouse/significant other Available Help at Discharge: Family;Available 24 hours/day Type of Home: House Home Access: Stairs to enter [employer] of Steps: 5 Entrance Stairs-Rails: Right Home Layout: One level     Bathroom Shower/Tub: Tub/shower unit;Walk-in shower   Bathroom Toilet: Standard Bathroom Accessibility: Yes   Home Equipment: Conservation officer, nature (2 wheels)          Prior Functioning/Environment Prior Level of Function : Independent/Modified Independent;Driving             Mobility Comments: Independent without DME;  reports staying active with exercise and yardwork ADLs Comments: Independent without DME; wife does majority of housework        OT Problem List: Decreased strength;Decreased activity tolerance;Impaired balance (sitting and/or standing);Decreased cognition;Decreased safety awareness;Decreased knowledge of use of DME or AE;Decreased knowledge of precautions;Cardiopulmonary status limiting activity;Obesity      OT Treatment/Interventions: Self-care/ADL training;Therapeutic exercise;Energy conservation;DME and/or AE instruction;Manual therapy;Modalities;Therapeutic activities;Patient/family education;Balance training    OT Goals(Current goals can be found in the care plan section) Acute Rehab OT Goals Patient Stated Goal: na OT Goal Formulation: Patient unable to participate in goal setting Time For Goal Achievement: 06/05/21 Potential to Achieve Goals: Good  OT Frequency: Min 2X/week    Co-evaluation PT/OT/SLP Co-Evaluation/Treatment: Yes Reason for Co-Treatment: Complexity of the patient's impairments (multi-system involvement);Necessary to address cognition/behavior during functional activity   OT goals addressed during session: ADL's and self-care;Strengthening/ROM;Proper use of Adaptive equipment and DME      AM-PAC OT "6 Clicks" Daily Activity     Outcome Measure Help from another person eating meals?: Total Help from another person taking care of personal grooming?: Total Help from another person toileting, which includes using toliet, bedpan, or urinal?: Total Help from another person bathing (including washing, rinsing, drying)?: Total Help from another person to put on and taking off regular upper body clothing?: Total Help from another person to put on and taking off regular lower body clothing?: Total 6 Click Score: 6   End of Session Equipment Utilized During Treatment: Oxygen Nurse Communication: Mobility status;Precautions  Activity Tolerance: Patient limited by  lethargy Patient left: in bed;with call bell/phone within reach;with nursing/sitter in room;with SCD's reapplied  OT Visit Diagnosis: Muscle weakness (generalized) (M62.81)                Time: 1610-9604 OT Time Calculation (min): 15 min Charges:  OT General Charges $OT Visit: 1 Visit OT Evaluation $OT Eval High Complexity: 1 High   Randall Hodges, OTR/L  Acute Rehabilitation Services Pager: [phone] Office: [phone] .   Jeri Modena 05/22/2021, 10:43 AM

## 2021-05-22 NOTE — Evaluation (Signed)
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 ETT 1/18-1/18. 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 Cortrak 1/30. 2/2 - VB ECMO initated intubated Randall Hodges prostate CA   Clinical Impression  Pt on caseload with medical setback, PT reordered 2/2. Pt with day 1 therapy evaluation on ECMO. Pt with sedation turned back 50% by ECMO RN staff. Pt currently unable to arouse, open eyes, or follow commands even with verbal and tactile stimulation. Pt moving arms but not to commands, more to generalized noxious stimuli. Will continue to follow acutely. Recommending AIR at this time pending progress in recovery as pt was independent without an AD PTA and has had such a complex medical situation this admission.         Recommendations for follow up therapy are one component of a multi-disciplinary discharge planning process, led by the attending physician.  Recommendations may be updated based on patient status, additional functional criteria and insurance authorization.  Follow Up Recommendations Acute inpatient rehab (3hours/day)    Assistance Recommended at Discharge Frequent or constant Supervision/Assistance  Patient can return home with the following  Assistance with cooking/housework;Direct supervision/assist for medications management;Assist for transportation;Help with stairs or ramp for entrance;A lot of help with walking and/or transfers;A lot of help with bathing/dressing/bathroom;Two people to help with walking and/or transfers;Two people to help with bathing/dressing/bathroom;Direct supervision/assist for financial management  (pending progression)    Equipment Recommendations BSC/3in1;Wheelchair (measurements PT);Wheelchair cushion (measurements PT);Hospital bed;Other (comment) (lift; pending progression)  Recommendations for Other Services  Rehab consult    Functional Status Assessment Patient has had a recent decline in their functional status and demonstrates the ability to make significant improvements in function in a reasonable and predictable amount of time.     Precautions / Restrictions Precautions Precautions: Fall;Sternal Precaution Booklet Issued: No Precaution Comments: ECMO, cortrak, foley, aline, sternal Restrictions Weight Bearing Restrictions: Yes (sternal precautions) Other Position/Activity Restrictions: sternal precautions      Mobility  Bed Mobility Overal bed mobility: Needs Assistance             General bed mobility comments: in chair position on arrival but unable sustain arousal with mutliple attempts. Sedation turned back by ECMO staff    Transfers                   General transfer comment: Unable today due to level of arousal    Ambulation/Gait               General Gait Details: Unable today due to level of arousal  Stairs            Wheelchair Mobility    Modified Rankin (Stroke Patients Only)       Balance                                             Pertinent Vitals/Pain Pain Assessment Pain Assessment: Faces Faces Pain Scale: Hurts a little bit Pain Location: momentary grimace or squeeze of hand with PROM Pain Descriptors / Indicators: Grimacing Pain Intervention(s): Monitored during session, Limited activity within patient's  tolerance    Home Living Family/patient expects to be discharged to:: Private residence Living Arrangements: Spouse/significant other Available Help at Discharge: Family;Available 24 hours/day Type of Home: House Home Access: Stairs to enter Entrance Stairs-Rails: Right Entrance  Stairs-Number of Steps: 5   Home Layout: One level Home Equipment: Conservation officer, nature (2 wheels)      Prior Function Prior Level of Function : Independent/Modified Independent;Driving             Mobility Comments: Independent without DME; reports staying active with exercise and yardwork ADLs Comments: Independent without DME; wife does majority of housework     Hand Dominance   Dominant Hand: Right    Extremity/Trunk Assessment   Upper Extremity Assessment Upper Extremity Assessment: Defer to OT evaluation    Lower Extremity Assessment Lower Extremity Assessment: Difficult to assess due to impaired cognition (WFL PROM)    Cervical / Trunk Assessment Cervical / Trunk Assessment: Normal  Communication   Communication: No difficulties  Cognition Arousal/Alertness: Lethargic, Suspect due to medications   Overall Cognitive Status: Difficult to assess                                 General Comments: Pt very lthergic, likely due to meds. Unable to arouse or get eyes to open with voice, sternal rubs or tactile stimulation. Occasional grimace or responsive squeezing of hand or muscle activation in UEs with PROM        General Comments General comments (skin integrity, edema, etc.): vent  PVR 40% FIO2 Peep 15, ECMO NE 4    Exercises General Exercises - Upper Extremity Shoulder Flexion: PROM, Both, 10 reps, Seated (in bed; < 90 degrees bil) Elbow Flexion: PROM, Both, 10 reps, Seated (in bed) Elbow Extension: PROM, Both, 10 reps, Seated (in bed) Digit Composite Flexion: PROM, Both, 10 reps, Seated (in bed) Composite Extension: PROM, Both, 10 reps, Seated (in bed) General Exercises - Lower Extremity Ankle Circles/Pumps: PROM, Left, 10 reps, Seated (in bed) Hip ABduction/ADduction: PROM, Both, 10 reps, Seated (in bed) Other Exercises Other Exercises: PROM into bil hip and knee flexion combo then extension 10x with pt seated in bed   Assessment/Plan    PT  Assessment Patient needs continued PT services  PT Problem List Decreased strength;Decreased activity tolerance;Decreased balance;Decreased mobility;Decreased knowledge of use of DME;Decreased knowledge of precautions;Cardiopulmonary status limiting activity;Pain       PT Treatment Interventions DME instruction;Gait training;Stair training;Functional mobility training;Therapeutic activities;Therapeutic exercise;Balance training;Patient/family education;Neuromuscular re-education    PT Goals (Current goals can be found in the Care Plan section)  Acute Rehab PT Goals Patient Stated Goal: did not state PT Goal Formulation: Patient unable to participate in goal setting Time For Goal Achievement: 06/05/21 Potential to Achieve Goals: Fair    Frequency Min 3X/week     Co-evaluation PT/OT/SLP Co-Evaluation/Treatment: Yes Reason for Co-Treatment: Complexity of the patient's impairments (multi-system involvement);For patient/therapist safety PT goals addressed during session: Strengthening/ROM OT goals addressed during session: ADL's and self-care;Strengthening/ROM;Proper use of Adaptive equipment and DME       AM-PAC PT "6 Clicks" Mobility  Outcome Measure Help needed turning from your back to your side while in a flat bed without using bedrails?: Total Help needed moving from lying on your back to sitting on the side of a flat bed without using bedrails?: Total Help needed moving to and from a bed to a chair (including a wheelchair)?: Total Help needed standing up  from a chair using your arms (e.g., wheelchair or bedside chair)?: Total Help needed to walk in hospital room?: Total Help needed climbing 3-5 steps with a railing? : Total 6 Click Score: 6    End of Session Equipment Utilized During Treatment: Oxygen Activity Tolerance: Patient limited by lethargy Patient left: in bed;with call bell/phone within reach;with bed alarm set;with nursing/sitter in room Nurse Communication:  Mobility status PT Visit Diagnosis: Muscle weakness (generalized) (M62.81);Other abnormalities of gait and mobility (R26.89);Difficulty in walking, not elsewhere classified (R26.2)    Time: 8768-1157 PT Time Calculation (min) (ACUTE ONLY): 14 min   Charges:   PT Evaluation $PT Eval High Complexity: 1 High          Moishe Spice, PT, DPT Acute Rehabilitation Services  Pager: [phone] Office: [phone]   Orvan Falconer 05/22/2021, 11:12 AM

## 2021-05-22 NOTE — Progress Notes (Signed)
Patient ID: LEVEN HOEL, male   DOB: [birth year], 71 y.o.   MRN: [id number]    Progress Note  Patient Name: Randall Hodges Date of Encounter: 05/22/2021  [facility] Cardiologist: [name], MD   Subjective   2/2: VV ECMO initiation, Crescent catheter right IJ  CXR with persistent bilateral lung infiltrates, possible mild improvement compared to yesterday.  He remains on vancomycin/meropenem with WBCs 15.9, afebrile.  I/Os ++ yesterday.  Creatinine lower at 1.69.    Precedex for sedation, will wake up and follow commands.  NE 12 and vasopressin 0.03 ongoing with stable MAP.  CVP 15, cox 58%.   He is in NSR on amiodarone gtt.  Bivalirudin gtt for ECMO circuit.   Has CorTrak tube feeds.   Vent FiO2 0.6  ECMO: Speed 2880 rpm Flow 3.6 L/min pVen -53 DeltaP 15 PTT 59 LDH 615 Lactate 1.7 ABG 7.43/47/81/96%  Cardiac Studies: Echo (limited, 1/30): Echo reviewed, there is clear respirophasic variation of the interventricular septum and marked respirophasic variation of E inflow velocity on doppler evaluation of the mitral valve.  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  LV EF 45-50%.   RHC Procedural Findings (on norepinephrine 6): Hemodynamics (mmHg) RA mean 12 RV 37/12 PA 38/16, mean 27 PCWP mean 11 LV 108/12 AO 96/55 PAPI 1.8 Oxygen saturations: PA 54% AO 94% Cardiac Output (Fick) 5.59  Cardiac Index (Fick) 2.81  PVR 2.8 WU Simultaneous RV/LV tracings were obtained.  Difficult to interpret due to atrial fibrillation.  There was some suggestion of discordance (ventricular interdependence) but not clear.  Inpatient Medications    Scheduled Meds:  aspirin  81 mg Per Tube Daily   chlorhexidine gluconate (MEDLINE KIT)  15 mL Mouth Rinse BID   [START ON 05/23/2021]  Chlorhexidine Gluconate Cloth  6 each Topical Daily   colchicine  0.6 mg Per Tube Daily   docusate  100 mg Per Tube BID   furosemide  60 mg Intravenous BID   furosemide  80 mg Intravenous BID   guaiFENesin  30 mL Per Tube QID   insulin aspart  0-15 Units Subcutaneous Q4H   mouth rinse  15 mL Mouth Rinse 10 times per day   methylPREDNISolone (SOLU-MEDROL) injection  40 mg Intravenous Q12H   metoCLOPramide (REGLAN) injection  10 mg Intravenous Q6H   pantoprazole (PROTONIX) IV  40 mg Intravenous QHS   polyethylene glycol  17 g Per Tube Daily   rosuvastatin  10 mg Per Tube Daily   sodium chloride flush  10-40 mL Intracatheter Q12H   sodium chloride flush  3 mL Intravenous Q12H   Continuous Infusions:  sodium chloride 10 mL/hr at 05/22/21 0700   albumin human     amiodarone 30 mg/hr (05/22/21 0700)   bivalirudin (ANGIOMAX) infusion 0.5 mg/mL (Non-ACS indications) 0.03 mg/kg/hr (05/22/21 0700)   dexmedetomidine (PRECEDEX) IV infusion 1.2 mcg/kg/hr (05/22/21 0500)   feeding supplement (VITAL 1.5 CAL) 1,000 mL (05/23/2021 1748)   fentaNYL infusion INTRAVENOUS     meropenem (MERREM) IV Stopped (05/22/21 0212)   norepinephrine (LEVOPHED) Adult infusion 11 mcg/min (05/22/21 0700)   potassium chloride     vancomycin Stopped (06/14/2021 2105)   vasopressin 0.03 Units/min (05/22/21 0700)   PRN Meds: Place/Maintain arterial line **AND** sodium chloride, acetaminophen (TYLENOL) oral liquid 160 mg/5 mL, albumin human, diazepam, fentaNYL (SUBLIMAZE) injection, fentaNYL (SUBLIMAZE) injection, hydrOXYzine, levalbuterol, ondansetron (  ZOFRAN) IV, sodium chloride flush   Vital Signs    Vitals:   05/22/21 0545 05/22/21 0600 05/22/21 0615 05/22/21 0630  BP:  113/60    Pulse: 76 76 76 76  Resp: '19 15 17 16  ' Temp:      TempSrc:      SpO2: 100% 99% 100% 100%  Weight:      Height:        Intake/Output Summary (Last 24 hours) at 05/22/2021 0749 Last data filed at 05/22/2021 0700 Gross per 24 hour  Intake  5448.09 ml  Output 2690 ml  Net 2758.09 ml   Last 3 Weights 05/22/2021 05/20/2021 05/19/2021  Weight (lbs) 178 lb 2.1 oz 185 lb 10 oz 190 lb 14.7 oz  Weight (kg) 80.8 kg 84.2 kg 86.6 kg      Telemetry    NSR 60s (personally reviewed)  Physical Exam  CVP 15 General: Intubated, right IJ Crescent cannula Neck: No JVD, no thyromegaly or thyroid nodule.  Lungs: Crackles bilaterally. . CV: Nondisplaced PMI.  Heart regular S1/S2, no S3/S4, no murmur.  1+ ankle edema.   Abdomen: Soft, nontender, no hepatosplenomegaly, no distention.  Skin: Intact without lesions or rashes.  Neurologic: Wakes up and follows commands. Extremities: No clubbing or cyanosis.  HEENT: Normal.   Labs    High Sensitivity Troponin:   Recent Labs  Lab 05/04/2021 1321 05/16/2021 1518  TROPONINIHS 3 4     Chemistry Recent Labs  Lab 05/17/21 1733 05/17/21 1818 05/20/21 0746 05/20/21 1719 06/04/2021 0314 06/11/2021 1313 06/11/2021 1711 06/12/2021 1803 06/03/2021 1935 05/22/21 0300 05/22/21 0303  NA 127*   < >  --    < > 133*   133*   < > 138   < > 137 139 138  K 3.5   < >  --    < > 3.8   3.9   < > 4.6   < > 4.9 4.2 4.1  CL 85*   < >  --   --  93*  --  95*  --   --  98  --   CO2 32   < >  --   --  30  --  31  --   --  32  --   GLUCOSE 126*   < >  --   --  177*  --  117*  --   --  143*  --   BUN 34*   < >  --   --  31*  --  39*  --   --  41*  --   CREATININE 2.46*   < >  --   --  1.37*  --  1.87*  --   --  1.69*  --   CALCIUM 7.6*   < >  --   --  8.1*  --  8.1*  --   --  8.0*  --   MG 1.9  --  1.9  --   --   --   --   --   --   --   --   PROT  --   --   --   --   --   --  4.4*  --   --  4.4*  --   ALBUMIN  --   --   --   --   --   --  2.0*  --   --  1.9*  --   AST  --   --   --   --   --   --  1,077*  --   --  1,035*  --   ALT  --   --   --   --   --   --  472*  --   --  479*  --   ALKPHOS  --   --   --   --   --   --  119  --   --  150*  --   BILITOT  --   --   --   --   --   --  1.4*  --   --  1.2  --    GFRNONAA 27*   < >  --   --  55*  --  38*  --   --  43*  --   ANIONGAP 10   < >  --   --  10  --  12  --   --  9  --    < > = values in this interval not displayed.    Lipids  No results for input(s): CHOL, TRIG, HDL, LABVLDL, LDLCALC, CHOLHDL in the last 168 hours.   Hematology Recent Labs  Lab 06/05/2021 1711 05/27/2021 1803 06/16/2021 2300 05/22/21 0300 05/22/21 0303  WBC 20.4*  --  16.3* 15.9*  --   RBC 2.64*  --  2.60* 2.70*  --   HGB 7.6*   < > 7.8* 7.7* 8.2*  HCT 23.8*   < > 23.2* 24.3* 24.0*  MCV 90.2  --  89.2 90.0  --   MCH 28.8  --  30.0 28.5  --   MCHC 31.9  --  33.6 31.7  --   RDW 15.0  --  15.0 15.2  --   PLT 185  --  181 179  --    < > = values in this interval not displayed.   Thyroid No results for input(s): TSH, FREET4 in the last 168 hours.  BNPNo results for input(s): BNP, PROBNP in the last 168 hours.  DDimer No results for input(s): DDIMER in the last 168 hours.   Radiology    CARDIAC CATHETERIZATION  Result Date: 05/30/2021 Successful VV ECMO cannulation and initiation.   DG Chest Port 1 View  Result Date: 05/24/2021 CLINICAL DATA:  Congestive heart failure EXAM: PORTABLE CHEST 1 VIEW COMPARISON:  05/23/2021 at 2:50 p.m. FINDINGS: Single frontal view of the chest demonstrates stable endotracheal tube, enteric catheter, and left subclavian catheter. The cardiac silhouette is enlarged. There has been interval progression of the bilateral interstitial and ground-glass opacities. Continued biapical densities may reflect pleural thickening or pleural fluid. Right posterior second rib fracture unchanged. IMPRESSION: 1. Progressive bilateral airspace disease consistent with worsening edema or infection. 2. Stable biapical densities consistent with pleural fluid. 3. Stable support devices. Electronically Signed   By: Randa Ngo M.D.   On: 06/13/2021 17:52   DG CHEST PORT 1 VIEW  Result Date: 06/15/2021 CLINICAL DATA:  Central line EXAM: PORTABLE CHEST 1 VIEW  COMPARISON:  06/02/2021 radiograph FINDINGS: Endotracheal tube overlies the midthoracic trachea. There is a right upper extremity PICC with tip overlying the distal superior vena cava. There is a right neck catheter sheath present without internal catheter. There is a new left subclavian catheter with tip overlying the distal superior vena cava. Feeding tube passes below the diaphragm, tip excluded by collimation. Prior median sternotomy and CABG. Unchanged cardiomediastinal silhouette. There are diffuse interstitial and airspace opacities, not significantly changed since the prior exam. Small bilateral pleural effusions, unchanged.  No visible pneumothorax. Unchanged right first and second rib fractures. IMPRESSION: New left subclavian catheter tip overlies the distal superior vena cava. Unchanged diffuse interstitial and airspace disease consistent with pulmonary edema and/or multifocal infection. Electronically Signed   By: Maurine Simmering M.D.   On: 05/25/2021 15:03   Portable Chest x-ray  Result Date: 06/10/2021 CLINICAL DATA:  Shortness of breath EXAM: PORTABLE CHEST 1 VIEW COMPARISON:  Previous studies including the examination done earlier today FINDINGS: There is interval placement of endotracheal tube with its tip approximately 5.2 cm above the carina. Enteric tube is noted traversing the esophagus. Transverse diameter of heart is increased. Extensive interstitial and alveolar densities seen in both lungs with interval improvement. There is blunting of both lateral CP angles. Apical pleural densities seen. There is no demonstrable pneumothorax. Tip of PICC line is seen in the superior vena cava. IMPRESSION: There is interval placement of endotracheal tube. Extensive interstitial and alveolar densities in both lungs suggest pulmonary edema or pneumonia. Small bilateral pleural effusions. Electronically Signed   By: Elmer Picker M.D.   On: 06/03/2021 08:51   DG Chest Port 1 View  Result Date:  06/11/2021 CLINICAL DATA:  Shortness of breath EXAM: PORTABLE CHEST 1 VIEW COMPARISON:  Previous studies including the examination of 05/20/2021 FINDINGS: Transverse diameter of heart is increased. Enteric tube is noted traversing the esophagus. Tip of PICC line is seen in the superior vena cava. Diffuse interstitial and alveolar densities seen in both lungs with possible slight worsening. Lateral CP angles are indistinct. There is no pneumothorax. Slightly displaced fracture is seen in the right second rib with no significant change. IMPRESSION: Diffuse interstitial and alveolar densities are noted throughout both lungs suggesting pulmonary edema or pneumonia with possible slight interval worsening. Electronically Signed   By: Elmer Picker M.D.   On: 05/31/2021 08:18   ECHOCARDIOGRAM LIMITED  Result Date: 06/08/2021    ECHOCARDIOGRAM LIMITED REPORT   Patient Name:   BLANE WORTHINGTON Date of Exam: 06/16/2021 Medical Rec #:  [id number]       Height:       70.0 in Accession #:    [id number]      Weight:       185.6 lb Date of Birth:  November 05, 1950       BSA:          2.022 m Patient Age:    60 years        BP:           125/46 mmHg Patient Gender: M               HR:           94 bpm. Exam Location:  Inpatient Procedure: Limited Echo, Cardiac Doppler and Color Doppler Indications:    CHF-Acute Systolic O45.99  History:        Patient has prior history of Echocardiogram examinations, most                 recent 05/19/2021. CAD, Prior CABG; Arrythmias:Atypical atrial                 flutter/PVCs. Acute kidney injury.  Sonographer:    Darlina Sicilian RDCS Referring Phys: Sparkman Comments: Echo performed with patient supine and on artificial respirator. Going for ECMO cannulation IMPRESSIONS  1. Poor acoustic windows. Septal flattening consistent with RV volume/pressure overload LVEF appears moderate to severely depressed (images 23,24) Inferolateral wall is akinetic mid; anterosept is  dyskinetic  mid / distal.  2. Right ventricular systolic function is mildly reduced. The right ventricular size is normal. There is severely elevated pulmonary artery systolic pressure.  3. No significant variation in mitral inflow with respiration.Marland Kitchen a small pericardial effusion is present. Large pleural effusion in the left lateral region.  4. The mitral valve is normal in structure. Trivial mitral valve regurgitation.  5. TR is eccentric. Limited evaluation from RV inflow view. Doppler images suggest TR is at least moderate in severity.  6. The aortic valve is tricuspid. Aortic valve regurgitation is mild. FINDINGS  Left Ventricle: Poor acoustic windows. Septal flattening consistent with RV volume/pressure overload LVEF appears moderate to severely depressed (images 23,24) Inferolateral wall is akinetic mid; anterosept is dyskinetic mid / distal. Right Ventricle: The right ventricular size is normal. Right ventricular systolic function is mildly reduced. There is severely elevated pulmonary artery systolic pressure. The tricuspid regurgitant velocity is 3.64 m/s, and with an assumed right atrial pressure of 8 mmHg, the estimated right ventricular systolic pressure is 84.1 mmHg. Right Atrium: Right atrial size was normal in size. Pericardium: No significant variation in mitral inflow with respiration. A small pericardial effusion is present. Mitral Valve: The mitral valve is normal in structure. There is mild thickening of the mitral valve leaflet(s). Mild to moderate mitral annular calcification. Trivial mitral valve regurgitation. Tricuspid Valve: TR is eccentric. Limited evaluation from RV inflow view. Doppler images suggest TR is at least moderate in severity. The tricuspid valve is normal in structure. Aortic Valve: The aortic valve is tricuspid. Aortic valve regurgitation is mild. Pulmonic Valve: The pulmonic valve was normal in structure. Additional Comments: There is a large pleural effusion in the left lateral region.  TRICUSPID VALVE TR Peak grad:   53.0 mmHg TR Vmax:        364.00 cm/s Dorris Carnes MD Electronically signed by Dorris Carnes MD Signature Date/Time: 06/12/2021/3:36:44 PM    Final     Cardiac Studies   Cath 04/19/2021 Distal left main [place] 111 bifurcation stenosis with 75% left main, 90% ostial to proximal LAD, and 80-90% ostial circumflex (difficult to assess due to heavy calcification). Severe mid circumflex disease with 70% eccentric mid stenosis and second obtuse marginal containing ostial to proximal greater than 80% stenosis.  (Bifurcation [place] 111 Severe calcification in left main and LAD in particular with diffuse 50% narrowing from proximal to mid vessel and tandem 70% stenoses in the mid LAD. Nondominant right coronary Normal LV function.  EF 55%.  LVEDP normal.    Patient Profile     71 y.o. male with PMH of PVCs presented with chest pain. Cardiac cath by Dr. Tamala Julian on 05/09/2021 showed 75% left main, 90% ost to prox LAD, 80-90% ost LCx, 70% mid LCx, 80% OM2, 50% prox to mid LAD, 70% mid LAD, EF 55%. Patient underwent CABG x 3 on 05/14/2021. Post op course complicated afib, treated with amio. CXR showed opacity in bilateral lung, started abx on 1/25 and diuretic. Started on Eliquis due to recurrence of afib.    Assessment & Plan    1. CAD: Admitted with unstable angina, cath with severe left main and proximal LAD/LCx disease (nondominant RCA).  CABG x 3 on 1/18 with LIMA-LAD, SVG-OM, SVG-left PDA.   - Continue ASA 81, statin.  2. 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 RHC 1/30 with equalization of  diastolic pressures.  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  Repeated echo 2/2 still showed respirophasic septal variation but not as impressive. CVP 15 today with I/Os positive, co-ox 58%.  - Lasix 60 mg IV bid today, replace K.  - With concern for development of post-surgical effusive/constrictive pericarditis, he is on colchicine.  - ?need for surgical intervention on effusive/constrictive pericarditis. Will need improvement of lung disease first then reassess, repeat echo not as impressive.   3. 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  Probably primarily septic/vasodilatory shock. He is currently on NE 12, vasopressin 0.03 with stable MAP. Lactate normal.  - Continue NE and vasopressin, wean as able. 4. Hiccups: Had been intractable, now seem resolved.  5. PNA: Of note, he does appear to have had some pre-existing ILD from 2019 CT chest (?sarcoidosis). CXR with persistent bilateral infiltrates, possible mild improvement compared to yesterday.  Have thought most likely aspiration PNA/pneumonitis in setting of intractable hiccups. He has developred ARDS.  Afebrile.  WBCs lower today 15.9, PCT 1.64.  COVID was negative.  ABG improved.  - Discussed with pulmonary, think amiodarone toxicity unlikely as he has only been on it post-op.  - Continue meropenem/vancomycin.  - Vent per CCM.  - Steroids to start per CCM 6. Atypical atrial flutter/PVCs:  NSR now on amiodarone.  - Continue amiodarone 30 mg/hr. - Bivalirudin gtt.   7. AKI: Creatinine 1.87 => 1.69.  Follow closely.  8. Elevated LFTs: Suspect shock liver, follow CMET.   CRITICAL CARE Performed by: Loralie Champagne  Total critical care time: 45 minutes  Critical care time was exclusive of separately billable procedures and treating other patients.  Critical care was necessary to treat or prevent imminent or life-threatening deterioration.  Critical care was time  spent personally by me on the following activities: development of treatment plan with patient and/or surrogate as well as nursing, discussions with consultants, evaluation of patient's response to treatment, examination of patient, obtaining history from patient or surrogate, ordering and performing treatments and interventions, ordering and review of laboratory studies, ordering and review of radiographic studies, pulse oximetry and re-evaluation of patient's condition.  Loralie Champagne 05/22/2021 7:49 AM

## 2021-05-22 NOTE — Progress Notes (Signed)
MAP goal > 60 per Aundra Dubin MD.

## 2021-05-22 NOTE — Progress Notes (Signed)
Nutrition Follow-up  DOCUMENTATION CODES:   Not applicable  INTERVENTION:   Tube Feeding via Cortrak: Vital 1.5 at 60 ml/hr Add Pro-Source TF 45 mL TID Provides 2280 kcals, 130 g of protein and 1094 mL of free water  Consider changing bowel regiment to prn   NUTRITION DIAGNOSIS:   Inadequate oral intake related to inability to eat as evidenced by NPO status.  Being addressed via TF   GOAL:   Patient will meet greater than or equal to 90% of their needs  Met via TF  MONITOR:   TF tolerance, Diet advancement, Labs, Weight trends  REASON FOR ASSESSMENT:   Rounds    ASSESSMENT:   71 yo male admitted with acute coronary syndrome, underwent CABG x 3 on 1/18; developed intractable hiccups/muscle spasms, aspiration pneumonia with respiratory failure. PMH includes prostate cancer  1/18 CABG x 3 1/29 Transferred back to ICU due to hypotension, worsening respiratory status 1/30 Cortrak placed-tip near LOT, TF initiated 2/02 Intubated, VV ECMO cannulation for ARDS  Pt remains on vent, VV ECMO, working with OT/PT. Requiring levophed (6 mcg/min), vasopressin (0.03 units/min)  Tolerating Vital 1.5 at 60 ml/hr via Cortrak (post -pyloric)  Current wt 80.8 kg; admit wt 77.6 kg  +BM with rectal tube in place  Labs: Creatinine 1.69, BUN 41, CBGs 112-182 Meds: colace, lasix, solumedrol, reglan, miralax    Diet Order:   Diet Order             Diet NPO time specified  Diet effective now                   EDUCATION NEEDS:   Education needs have been addressed  Skin:  Skin Assessment: Skin Integrity Issues: Skin Integrity Issues:: Stage II Stage II: nose d/t BiPap mask Incisions: sternum, leg, groin  Last BM:  2/3 rectal tube  Height:   Ht Readings from Last 1 Encounters:  05/10/2021 '5\' 10"'  (1.778 m)    Weight:   Wt Readings from Last 1 Encounters:  05/22/21 80.8 kg    BMI:  Body mass index is 25.56 kg/m.  Estimated Nutritional Needs:   Kcal:   2000-2200 kcals  Protein:  125-150 g  Fluid:  1.8 L   Kerman Passey MS, RDN, LDN, CNSC Registered Dietitian III Clinical Nutrition RD Pager and On-Call Pager Number Located in [place]

## 2021-05-23 ENCOUNTER — Inpatient Hospital Stay (HOSPITAL_COMMUNITY): Payer: PPO

## 2021-05-23 DIAGNOSIS — I249 Acute ischemic heart disease, unspecified: Secondary | ICD-10-CM | POA: Diagnosis not present

## 2021-05-23 LAB — POCT I-STAT 7, (LYTES, BLD GAS, ICA,H+H)
Acid-Base Excess: 14 mmol/L — ABNORMAL HIGH (ref 0.0–2.0)
Acid-Base Excess: 8 mmol/L — ABNORMAL HIGH (ref 0.0–2.0)
Acid-Base Excess: 8 mmol/L — ABNORMAL HIGH (ref 0.0–2.0)
Acid-Base Excess: 8 mmol/L — ABNORMAL HIGH (ref 0.0–2.0)
Acid-Base Excess: 9 mmol/L — ABNORMAL HIGH (ref 0.0–2.0)
Acid-Base Excess: 9 mmol/L — ABNORMAL HIGH (ref 0.0–2.0)
Bicarbonate: 33.3 mmol/L — ABNORMAL HIGH (ref 20.0–28.0)
Bicarbonate: 33.6 mmol/L — ABNORMAL HIGH (ref 20.0–28.0)
Bicarbonate: 34.6 mmol/L — ABNORMAL HIGH (ref 20.0–28.0)
Bicarbonate: 35.2 mmol/L — ABNORMAL HIGH (ref 20.0–28.0)
Bicarbonate: 35.3 mmol/L — ABNORMAL HIGH (ref 20.0–28.0)
Bicarbonate: 41 mmol/L — ABNORMAL HIGH (ref 20.0–28.0)
Calcium, Ion: 1.15 mmol/L (ref 1.15–1.40)
Calcium, Ion: 1.15 mmol/L (ref 1.15–1.40)
Calcium, Ion: 1.16 mmol/L (ref 1.15–1.40)
Calcium, Ion: 1.16 mmol/L (ref 1.15–1.40)
Calcium, Ion: 1.17 mmol/L (ref 1.15–1.40)
Calcium, Ion: 1.17 mmol/L (ref 1.15–1.40)
HCT: 21 % — ABNORMAL LOW (ref 39.0–52.0)
HCT: 22 % — ABNORMAL LOW (ref 39.0–52.0)
HCT: 23 % — ABNORMAL LOW (ref 39.0–52.0)
HCT: 24 % — ABNORMAL LOW (ref 39.0–52.0)
HCT: 24 % — ABNORMAL LOW (ref 39.0–52.0)
HCT: 24 % — ABNORMAL LOW (ref 39.0–52.0)
Hemoglobin: 7.1 g/dL — ABNORMAL LOW (ref 13.0–17.0)
Hemoglobin: 7.5 g/dL — ABNORMAL LOW (ref 13.0–17.0)
Hemoglobin: 7.8 g/dL — ABNORMAL LOW (ref 13.0–17.0)
Hemoglobin: 8.2 g/dL — ABNORMAL LOW (ref 13.0–17.0)
Hemoglobin: 8.2 g/dL — ABNORMAL LOW (ref 13.0–17.0)
Hemoglobin: 8.2 g/dL — ABNORMAL LOW (ref 13.0–17.0)
O2 Saturation: 83 %
O2 Saturation: 92 %
O2 Saturation: 94 %
O2 Saturation: 95 %
O2 Saturation: 95 %
O2 Saturation: 96 %
Patient temperature: 36.6
Patient temperature: 36.7
Patient temperature: 36.8
Patient temperature: 36.8
Patient temperature: 36.8
Patient temperature: 36.9
Potassium: 3.8 mmol/L (ref 3.5–5.1)
Potassium: 4 mmol/L (ref 3.5–5.1)
Potassium: 4.1 mmol/L (ref 3.5–5.1)
Potassium: 4.1 mmol/L (ref 3.5–5.1)
Potassium: 4.2 mmol/L (ref 3.5–5.1)
Potassium: 4.2 mmol/L (ref 3.5–5.1)
Sodium: 137 mmol/L (ref 135–145)
Sodium: 140 mmol/L (ref 135–145)
Sodium: 140 mmol/L (ref 135–145)
Sodium: 141 mmol/L (ref 135–145)
Sodium: 141 mmol/L (ref 135–145)
Sodium: 142 mmol/L (ref 135–145)
TCO2: 35 mmol/L — ABNORMAL HIGH (ref 22–32)
TCO2: 35 mmol/L — ABNORMAL HIGH (ref 22–32)
TCO2: 36 mmol/L — ABNORMAL HIGH (ref 22–32)
TCO2: 37 mmol/L — ABNORMAL HIGH (ref 22–32)
TCO2: 37 mmol/L — ABNORMAL HIGH (ref 22–32)
TCO2: 43 mmol/L — ABNORMAL HIGH (ref 22–32)
pCO2 arterial: 53.4 mmHg — ABNORMAL HIGH (ref 32.0–48.0)
pCO2 arterial: 54.9 mmHg — ABNORMAL HIGH (ref 32.0–48.0)
pCO2 arterial: 55.8 mmHg — ABNORMAL HIGH (ref 32.0–48.0)
pCO2 arterial: 58.1 mmHg — ABNORMAL HIGH (ref 32.0–48.0)
pCO2 arterial: 58.8 mmHg — ABNORMAL HIGH (ref 32.0–48.0)
pCO2 arterial: 66.2 mmHg (ref 32.0–48.0)
pH, Arterial: 7.377 (ref 7.350–7.450)
pH, Arterial: 7.391 (ref 7.350–7.450)
pH, Arterial: 7.393 (ref 7.350–7.450)
pH, Arterial: 7.399 (ref 7.350–7.450)
pH, Arterial: 7.402 (ref 7.350–7.450)
pH, Arterial: 7.406 (ref 7.350–7.450)
pO2, Arterial: 48 mmHg — ABNORMAL LOW (ref 83.0–108.0)
pO2, Arterial: 65 mmHg — ABNORMAL LOW (ref 83.0–108.0)
pO2, Arterial: 71 mmHg — ABNORMAL LOW (ref 83.0–108.0)
pO2, Arterial: 81 mmHg — ABNORMAL LOW (ref 83.0–108.0)
pO2, Arterial: 82 mmHg — ABNORMAL LOW (ref 83.0–108.0)
pO2, Arterial: 85 mmHg (ref 83.0–108.0)

## 2021-05-23 LAB — BASIC METABOLIC PANEL
Anion gap: 9 (ref 5–15)
Anion gap: 7 (ref 5–15)
BUN: 53 mg/dL — ABNORMAL HIGH (ref 8–23)
BUN: 58 mg/dL — ABNORMAL HIGH (ref 8–23)
CO2: 31 mmol/L (ref 22–32)
CO2: 30 mmol/L (ref 22–32)
Calcium: 7.9 mg/dL — ABNORMAL LOW (ref 8.9–10.3)
Calcium: 7.8 mg/dL — ABNORMAL LOW (ref 8.9–10.3)
Chloride: 99 mmol/L (ref 98–111)
Chloride: 101 mmol/L (ref 98–111)
Creatinine, Ser: 1.3 mg/dL — ABNORMAL HIGH (ref 0.61–1.24)
Creatinine, Ser: 1.34 mg/dL — ABNORMAL HIGH (ref 0.61–1.24)
GFR, Estimated: 59 mL/min — ABNORMAL LOW (ref >60)
GFR, Estimated: 57 mL/min — ABNORMAL LOW (ref >60)
Glucose, Bld: 200 mg/dL — ABNORMAL HIGH (ref 70–99)
Glucose, Bld: 171 mg/dL — ABNORMAL HIGH (ref 70–99)
Potassium: 4.4 mmol/L (ref 3.5–5.1)
Potassium: 4.1 mmol/L (ref 3.5–5.1)
Sodium: 139 mmol/L (ref 135–145)
Sodium: 138 mmol/L (ref 135–145)

## 2021-05-23 LAB — CULTURE, RESPIRATORY W GRAM STAIN
Culture: NO GROWTH
Culture: NO GROWTH
Gram Stain: NONE SEEN

## 2021-05-23 LAB — HEPATIC FUNCTION PANEL
ALT: 350 U/L — ABNORMAL HIGH (ref 0–44)
AST: 327 U/L — ABNORMAL HIGH (ref 15–41)
Albumin: 1.9 g/dL — ABNORMAL LOW (ref 3.5–5.0)
Alkaline Phosphatase: 158 U/L — ABNORMAL HIGH (ref 38–126)
Bilirubin, Direct: 0.3 mg/dL — ABNORMAL HIGH (ref 0.0–0.2)
Indirect Bilirubin: 0.7 mg/dL (ref 0.3–0.9)
Total Bilirubin: 1 mg/dL (ref 0.3–1.2)
Total Protein: 4.5 g/dL — ABNORMAL LOW (ref 6.5–8.1)

## 2021-05-23 LAB — CBC
HCT: 23.7 % — ABNORMAL LOW (ref 39.0–52.0)
HCT: 24.1 % — ABNORMAL LOW (ref 39.0–52.0)
Hemoglobin: 7.9 g/dL — ABNORMAL LOW (ref 13.0–17.0)
Hemoglobin: 7.7 g/dL — ABNORMAL LOW (ref 13.0–17.0)
MCH: 30 pg (ref 26.0–34.0)
MCH: 29.1 pg (ref 26.0–34.0)
MCHC: 33.3 g/dL (ref 30.0–36.0)
MCHC: 32 g/dL (ref 30.0–36.0)
MCV: 90.1 fL (ref 80.0–100.0)
MCV: 90.9 fL (ref 80.0–100.0)
Platelets: 175 10*3/uL (ref 150–400)
Platelets: 187 10*3/uL (ref 150–400)
RBC: 2.63 MIL/uL — ABNORMAL LOW (ref 4.22–5.81)
RBC: 2.65 MIL/uL — ABNORMAL LOW (ref 4.22–5.81)
RDW: 15.3 % (ref 11.5–15.5)
RDW: 15.4 % (ref 11.5–15.5)
WBC: 12.1 10*3/uL — ABNORMAL HIGH (ref 4.0–10.5)
WBC: 11.3 10*3/uL — ABNORMAL HIGH (ref 4.0–10.5)
nRBC: 0 % (ref 0.0–0.2)
nRBC: 0 % (ref 0.0–0.2)

## 2021-05-23 LAB — COOXEMETRY PANEL
Carboxyhemoglobin: 1.6 % — ABNORMAL HIGH (ref 0.5–1.5)
Methemoglobin: 1.1 % (ref 0.0–1.5)
O2 Saturation: 64.1 %
Total hemoglobin: 7.6 g/dL — ABNORMAL LOW (ref 12.0–16.0)

## 2021-05-23 LAB — GLUCOSE, CAPILLARY
Glucose-Capillary: 180 mg/dL — ABNORMAL HIGH (ref 70–99)
Glucose-Capillary: 171 mg/dL — ABNORMAL HIGH (ref 70–99)
Glucose-Capillary: 168 mg/dL — ABNORMAL HIGH (ref 70–99)
Glucose-Capillary: 165 mg/dL — ABNORMAL HIGH (ref 70–99)
Glucose-Capillary: 165 mg/dL — ABNORMAL HIGH (ref 70–99)
Glucose-Capillary: 150 mg/dL — ABNORMAL HIGH (ref 70–99)

## 2021-05-23 LAB — FIBRINOGEN: Fibrinogen: 692 mg/dL — ABNORMAL HIGH (ref 210–475)

## 2021-05-23 LAB — APTT
aPTT: 55 seconds — ABNORMAL HIGH (ref 24–36)
aPTT: 53 seconds — ABNORMAL HIGH (ref 24–36)

## 2021-05-23 LAB — LACTIC ACID, PLASMA: Lactic Acid, Venous: 1.2 mmol/L (ref 0.5–1.9)

## 2021-05-23 LAB — SEDIMENTATION RATE: Sed Rate: 65 mm/hr — ABNORMAL HIGH (ref 0–16)

## 2021-05-23 LAB — PROTIME-INR
INR: 1.9 — ABNORMAL HIGH (ref 0.8–1.2)
Prothrombin Time: 22.1 seconds — ABNORMAL HIGH (ref 11.4–15.2)

## 2021-05-23 LAB — PROCALCITONIN: Procalcitonin: 0.95 ng/mL

## 2021-05-23 LAB — VANCOMYCIN, PEAK: Vancomycin Pk: 35 ug/mL (ref 30–40)

## 2021-05-23 LAB — VANCOMYCIN, TROUGH: Vancomycin Tr: 18 ug/mL (ref 15–20)

## 2021-05-23 LAB — LACTATE DEHYDROGENASE: LDH: 380 U/L — ABNORMAL HIGH (ref 98–192)

## 2021-05-23 MED ORDER — FUROSEMIDE 10 MG/ML IJ SOLN: 80.0000 mg | Freq: Once | INTRAMUSCULAR | Status: AC

## 2021-05-23 MED ORDER — AMIODARONE IV BOLUS ONLY 150 MG/100ML
150.0000 mg | Freq: Once | INTRAVENOUS | Status: AC
  Administered 2021-05-23: 150 mg via INTRAVENOUS

## 2021-05-23 MED ORDER — FUROSEMIDE 10 MG/ML IJ SOLN
80.0000 mg | Freq: Once | INTRAMUSCULAR | Status: AC
  Administered 2021-05-23: 80 mg via INTRAVENOUS
  Filled 2021-05-23: qty 8

## 2021-05-23 MED ORDER — VANCOMYCIN HCL 500 MG/100ML IV SOLN
500.0000 mg | Freq: Two times a day (BID) | INTRAVENOUS | Status: DC
  Administered 2021-05-23 – 2021-05-24 (×3): 500 mg via INTRAVENOUS
  Filled 2021-05-23 (×4): qty 100

## 2021-05-23 MED ORDER — FUROSEMIDE 10 MG/ML IJ SOLN
INTRAMUSCULAR | Status: AC
  Administered 2021-05-23: 80 mg via INTRAVENOUS
  Filled 2021-05-23: qty 8

## 2021-05-23 NOTE — Progress Notes (Signed)
NAME:  Randall Hodges, MRN:  [id number], DOB:  [birth year], LOS: 6 ADMISSION DATE:  04/23/2021, CONSULTATION DATE: 04/21/2021 REFERRING MD:  Dahlia Byes, MD, CHIEF COMPLAINT: Increasing shortness of breath  History of Present Illness:  71 year old male with coronary artery disease who initially presented with chest pain, noted to have multivessel coronary artery disease, he underwent CABG x3 on 04/1818 23, course was complicated with atrial fibrillation, frequent hiccups and aspiration pneumonia leading to hypoxia and increasing oxygen requirement. PCCM was consulted for evaluation and help with management  Patient stated hiccups are better but continued complain of shortness of breath, cough unable to bring up phlegm.  Denies chest pain or palpitation During my evaluation patient is on high flow nasal cannula oxygen at 12 L and he is requiring low-dose Levophed to maintain MAP above 65.  Of note patient had echocardiogram repeated today, consistent with constrictive picture.  Pertinent  Medical History   Past Medical History:  Diagnosis Date   Allergic rhinitis    Prostate cancer ([place])     Significant Hospital Events: Including procedures, antibiotic start and stop dates in addition to other pertinent events   1/18 CABG 1/30 PCCM consult, Worsening O2 needs, likely aspiration, new pressor need 2/2 intubation, placed on VV ECMO  Interim History / Subjective:  No events. RASS -1 Follows commands x 4  Objective   Blood pressure (!) 106/57, pulse 76, temperature 98.2 F (36.8 C), resp. rate 15, height _0  (1.778 m), weight 84.6 kg, SpO2 97 %. CVP:  [11 mmHg-15 mmHg] 15 mmHg  Vent Mode: PCV FiO2 (%):  [40 %] 40 % Set Rate:  [12 bmp] 12 bmp PEEP:  [10 cmH20] 10 cmH20   Intake/Output Summary (Last 24 hours) at 05/23/2021 0804 Last data filed at 05/23/2021 0700 Gross per 24 hour  Intake 4501.28 ml  Output 3365 ml  Net 1136.28 ml    Filed Weights   05/20/21 0500 05/22/21  0300 05/23/21 0411  Weight: 84.2 kg 80.8 kg 84.6 kg    Examination: No distress Lungs with mechanical breath sounds bilaterally Has anasarca Moves ext x 4 Cannula and circuit look good, good color change, no clots in circuit or oxygenator  Current drips: fent, amio, vaso, precedex, levo, bival CVP 15  Lab review Improved liver and kidney LDH 1400>>615>>380 CBG ok WBC stable PTT 55 INR 1.9 Fibrinogen stable Hgb stable  Plts okay Coox 64%  CXR personally reviewed: improved RUL aeration otherwise stable ARDS type infiltrates, question enlarging R effusion  Resolved Hospital Problem list   Hiccups postop  Assessment & Plan:  CAD with unstable angina s/p CABGx3 1/18 (LIMA-LAD, SVG-OM, SVG-left PDA) Postop recurrent shock question constrictive pericaritis type physiology vs. Septic Presumed aspiration pneumonitis vs. HCAP vs. ?AEILD culminating in severe ARDS with inability to ventilate along with high Aa gradient s/p VV ECMO cannulation 05/21/22.  CXR deteriorated between 1/26 and 1/29.   Need for [facility] for ECMO Atypical aflutter on amio Baseline fibrotic lung disease with preserved PFTs 2019, question of sarcoid- lost to f/u, see Dr. Matilde Bash note 08/15/17 AKI- multifactorial (sepsis, poor cardiac outflow, shock) improved Bilateral effusions- post CABG, R>L on bedside US (2/3), do not think would help vent wean at this time to drain  - Diuresis, flow, and sweep wean per CHF team - Usual hemolysis panel monitoring - Bival with PharmD aPTT monitoring and adjustment - Fighting vent, will change to PS and let him decide on minute ventilation - Fent/Precedex titrated to RASS -1 -  Continue broad spectrum abx, f/u BAL data - Continue steroids for now, taper rapidly if BAL grows anything - PT/OT, TF as ordered  Best Practice (right click and "Reselect all SmartList Selections" daily)   Diet/type: Vital @ 70m/hr DVT prophylaxis: Bival CBG: yes, SSI GI prophylaxis: PPI Lines: L  [place] TLC, RIJ  Foley:  Yes, keep Code Status:  full code Last date of multidisciplinary goals of care discussion [2/2 by Dr. CTacy Learn 45 min cc time DErskine EmeryMD PCCM

## 2021-05-23 NOTE — Progress Notes (Signed)
Patient ID: STANELY SEXSON, male   DOB: 06-15-1950, 70 y.o.   MRN: [id number]  Extracorporeal support note   ECLS support day: Indication: ARDS  Configuration: VV  Drainage cannula: [place] R IJ Return cannula: Crescent R IJ  Pump speed: 3000 rpm Pump flow: 3.8 L/min Pump used: Cardiohelp  Sweep gas: 4  Circuit check: No thrombus. Good color change. LDH stable.  Anticoagulant: Bivalirudin  Changes in support: Continue current support. Diurese. Vent adjusted per CCM.   Anticipated goals/duration of support: Wean to discontinuation  Glori Bickers, MD  9:07 AM

## 2021-05-23 NOTE — Progress Notes (Signed)
ANTICOAGULATION CONSULT NOTE -  Pharmacy Consult for Bivalirudin Indication:  ECMO  Allergies  Allergen Reactions   Amoxicillin-Pot Clavulanate     Per pt report on 08/19/20, makes his urine dark colored   Atorvastatin Other (See Comments)     ( pt states causing runny nose, headaches, issue w/ urination)   Plant Derived Enzymes     Other reaction(s): Unknown   Trichophyton Other (See Comments)    Patient Measurements: Height: 5\' 10"  (177.8 cm) Weight: 84.6 kg (186 lb 8.2 oz) IBW/kg (Calculated) : 73  Vital Signs: Temp: 98.2 F (36.8 C) (02/04 0715) Temp Source: Bladder (02/04 0400) BP: 106/57 (02/04 0700) Pulse Rate: 76 (02/04 0715)  Labs: Recent Labs    05/31/2021 1711 06/05/2021 1803 06/09/2021 2300 05/22/21 0300 05/22/21 0303 05/22/21 1122 05/22/21 1125 05/22/21 1553 05/22/21 1555 05/22/21 1939 05/23/21 0320 05/23/21 0324  HGB 7.6*   < > 7.8* 7.7*   < >  --    < > 8.1*   < > 8.5* 7.9* 7.8*  HCT 23.8*   < > 23.2* 24.3*   < >  --    < > 24.4*   < > 25.0* 23.7* 23.0*  PLT 185  --  181 179  --   --   --  178  --   --  175  --   APTT 166*  --  62* 59*  --  60*  --  58*  --   --  55*  --   LABPROT 22.5*  --   --  25.1*  --   --   --   --   --   --  22.1*  --   INR 2.0*  --   --  2.3*  --   --   --   --   --   --  1.9*  --   HEPARINUNFRC 0.68  --  0.18* 0.13*  --  0.10*  --   --   --   --   --   --   CREATININE 1.87*  --   --  1.69*  --   --   --  1.51*  --   --  1.30*  --    < > = values in this interval not displayed.     Estimated Creatinine Clearance: 53.8 mL/min (A) (by C-G formula based on SCr of 1.3 mg/dL (H)).   Medical History: Past Medical History:  Diagnosis Date   Allergic rhinitis    Prostate cancer ([place])     Medications:  Infusions:   sodium chloride 10 mL/hr at 05/23/21 0700   albumin human     amiodarone 30 mg/hr (05/23/21 0700)   bivalirudin (ANGIOMAX) infusion 0.5 mg/mL (Non-ACS indications) 0.03 mg/kg/hr (05/23/21 0700)   dexmedetomidine  (PRECEDEX) IV infusion 0.6 mcg/kg/hr (05/23/21 0700)   feeding supplement (VITAL 1.5 CAL) 1,000 mL (05/23/21 0537)   fentaNYL infusion INTRAVENOUS 100 mcg/hr (05/23/21 0700)   meropenem (MERREM) IV Stopped (05/23/21 0304)   norepinephrine (LEVOPHED) Adult infusion 5 mcg/min (05/23/21 0700)   vancomycin Stopped (05/22/21 2318)   vasopressin 0.03 Units/min (05/23/21 0700)    Assessment: 71 year old male with coronary artery disease who initially presented with chest pain, noted to have multivessel coronary artery disease, he underwent CABG x3 on 04/1818 23, course was complicated with atrial fibrillation, frequent hiccups and aspiration pneumonia leading to hypoxia and increasing oxygen requirement. Patient now requiring VV ECMO. Pharmacy consulted for bivalirudin IV.   Aptt therapeutic  at 55 sec (on bival 0.03 mg/kg/hr). Hemoglobin low at 7.8. Platelets stable at 175.   LDH coming down to 380, fibrinogen 692. No issues with bleeding or clotting per RN Misha.   Goal of Therapy:  aPTT 50-80 seconds Monitor platelets by anticoagulation protocol: Yes   Plan:  Continue bivalirudin at 0.03 mg/kg/hr (5.1 ml/hr) Aptt has been stable continue q12 hour checks CBC q12 hours  Thank you for allowing pharmacy to be a part of this patients care.  Cathrine Muster, PharmD PGY2 Cardiology Pharmacy Resident Phone: [phone] 05/23/2021  7:29 AM  Please check [email or web address] for unit-specific pharmacy phone numbers.

## 2021-05-23 NOTE — Progress Notes (Signed)
Patient ID: Randall Hodges, male   DOB: 1950-09-01, 71 y.o.   MRN: [id number]    Progress Note  Patient Name: Randall Hodges Date of Encounter: 05/23/2021  [facility] Cardiologist: [name], MD   Subjective   2/2: VV ECMO initiation, Crescent catheter right IJ   Remains on vent. On precedex for sedation. Will wake up and follow commands. Fighting vent a bit. CVP 15. Received lasix 60 IV x 2 yesterday. Co-ox 64%  He is in NSR on amiodarone gtt.  Bivalirudin gtt for ECMO circuit. No bleeding  LDH 615 -> 380   Has CorTrak tube feeds.   Vent FiO2 0.6 -> 0.4  ECMO: Speed 3000 rpm Flow 3.8 L/min pVen -49 DeltaP 16 PTT 59 LDH 615 -> 380  Lactate 1.7 -> 1.2 ABG 7.40/53/71/94%  Cardiac Studies: Echo (limited, 1/30): Echo reviewed, there is clear respirophasic variation of the interventricular septum and marked respirophasic variation of E inflow velocity on doppler evaluation of the mitral valve.  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  LV EF 45-50%.   RHC Procedural Findings (on norepinephrine 6): Hemodynamics (mmHg) RA mean 12 RV 37/12 PA 38/16, mean 27 PCWP mean 11 LV 108/12 AO 96/55 PAPI 1.8 Oxygen saturations: PA 54% AO 94% Cardiac Output (Fick) 5.59  Cardiac Index (Fick) 2.81  PVR 2.8 WU Simultaneous RV/LV tracings were obtained.  Difficult to interpret due to atrial fibrillation.  There was some suggestion of discordance (ventricular interdependence) but not clear.  Inpatient Medications    Scheduled Meds:  aspirin  81 mg Per Tube Daily   chlorhexidine gluconate (MEDLINE KIT)  15 mL Mouth Rinse BID   Chlorhexidine Gluconate Cloth  6 each Topical Daily   colchicine  0.6 mg Per Tube Daily   docusate  100 mg Per Tube BID   feeding supplement (PROSource TF)  45  mL Per Tube TID   furosemide  80 mg Intravenous Once   guaiFENesin  30 mL Per Tube QID   insulin aspart  0-15 Units Subcutaneous Q4H   mouth rinse  15 mL Mouth Rinse 10 times per day   methylPREDNISolone (SOLU-MEDROL) injection  40 mg Intravenous Q12H   metoCLOPramide (REGLAN) injection  10 mg Intravenous Q6H   pantoprazole (PROTONIX) IV  40 mg Intravenous QHS   polyethylene glycol  17 g Per Tube Daily   rosuvastatin  10 mg Per Tube Daily   sodium chloride flush  10-40 mL Intracatheter Q12H   sodium chloride flush  3 mL Intravenous Q12H   Continuous Infusions:  sodium chloride 10 mL/hr at 05/23/21 0800   albumin human     amiodarone 30 mg/hr (05/23/21 0800)   bivalirudin (ANGIOMAX) infusion 0.5 mg/mL (Non-ACS indications) 0.03 mg/kg/hr (05/23/21 0800)   dexmedetomidine (PRECEDEX) IV infusion 0.5 mcg/kg/hr (05/23/21 0800)   feeding supplement (VITAL 1.5 CAL) 1,000 mL (05/23/21 0537)   fentaNYL infusion INTRAVENOUS 100 mcg/hr (05/23/21 0800)   meropenem (MERREM) IV Stopped (05/23/21 0304)   norepinephrine (LEVOPHED) Adult infusion 3 mcg/min (05/23/21 0800)   vancomycin Stopped (05/22/21 2318)   vasopressin 0.03 Units/min (05/23/21 0800)   PRN Meds: Place/Maintain arterial line **AND** sodium chloride, acetaminophen (TYLENOL) oral liquid 160 mg/5 mL, albumin human, diazepam, fentaNYL (SUBLIMAZE) injection, fentaNYL (SUBLIMAZE) injection, hydrOXYzine, levalbuterol, ondansetron (ZOFRAN) IV, sodium chloride flush   Vital Signs    Vitals:   05/23/21 0815 05/23/21  6440 05/23/21 0830 05/23/21 0845  BP:      Pulse: 75 73 78 76  Resp: (!) '8 13 13 ' (!) 7  Temp: 98.2 F (36.8 C)  98.2 F (36.8 C) 98.2 F (36.8 C)  TempSrc:      SpO2: 100% 99% 100% 100%  Weight:      Height:        Intake/Output Summary (Last 24 hours) at 05/23/2021 0908 Last data filed at 05/23/2021 0800 Gross per 24 hour  Intake 4453.44 ml  Output 3015 ml  Net 1438.44 ml    Last 3 Weights 05/23/2021 05/22/2021  05/20/2021  Weight (lbs) 186 lb 8.2 oz 178 lb 2.1 oz 185 lb 10 oz  Weight (kg) 84.6 kg 80.8 kg 84.2 kg      Telemetry    NSR 70-80s (personally reviewed)  Physical Exam   General:  Awake on vent No resp difficulty HEENT: normal + ETT Neck: RIJ Crescent. Carotids 2+ bilat; no bruits. No lymphadenopathy or thryomegaly appreciated. Cor: PMI nondisplaced. Regular rate & rhythm. No rubs, gallops or murmurs. Lungs: coarse Abdomen: soft, nontender, nondistended. No hepatosplenomegaly. No bruits or masses. Good bowel sounds. Extremities: no cyanosis, clubbing, rash, 2+ edema Neuro: awake on vent   Labs    High Sensitivity Troponin:   Recent Labs  Lab 05/03/2021 1321 04/23/2021 1518  TROPONINIHS 3 4      Chemistry Recent Labs  Lab 05/17/21 1733 05/17/21 1818 05/20/21 0746 05/20/21 1011 06/16/2021 1711 06/12/2021 1803 05/22/21 0300 05/22/21 0303 05/22/21 1553 05/22/21 1555 05/22/21 1939 05/22/21 2100 05/23/21 0320 05/23/21 0324  NA 127*   < >  --    < > 138   < > 139   < > 136   < > 139  --  139 137  K 3.5   < >  --    < > 4.6   < > 4.2   < > 4.5   < > 4.3  --  4.4 4.2  CL 85*   < >  --    < > 95*  --  98  --  99  --   --   --  99  --   CO2 32   < >  --    < > 31  --  32  --  30  --   --   --  31  --   GLUCOSE 126*   < >  --    < > 117*  --  143*  --  190*  --   --   --  200*  --   BUN 34*   < >  --    < > 39*  --  41*  --  46*  --   --   --  53*  --   CREATININE 2.46*   < >  --    < > 1.87*  --  1.69*  --  1.51*  --   --   --  1.30*  --   CALCIUM 7.6*   < >  --    < > 8.1*  --  8.0*  --  7.9*  --   --   --  7.9*  --   MG 1.9  --  1.9  --   --   --   --   --   --   --   --  1.8  --   --   PROT  --   --   --   --  4.4*  --  4.4*  --   --   --   --   --  4.5*  --   ALBUMIN  --   --   --   --  2.0*  --  1.9*  --   --   --   --   --  1.9*  --   AST  --   --   --   --  1,077*  --  1,035*  --   --   --   --   --  327*  --   ALT  --   --   --   --  472*  --  479*  --   --   --   --    --  350*  --   ALKPHOS  --   --   --   --  119  --  150*  --   --   --   --   --  158*  --   BILITOT  --   --   --   --  1.4*  --  1.2  --   --   --   --   --  1.0  --   GFRNONAA 27*   < >  --    < > 38*  --  43*  --  49*  --   --   --  59*  --   ANIONGAP 10   < >  --    < > 12  --  9  --  7  --   --   --  9  --    < > = values in this interval not displayed.     Lipids  No results for input(s): CHOL, TRIG, HDL, LABVLDL, LDLCALC, CHOLHDL in the last 168 hours.   Hematology Recent Labs  Lab 05/22/21 0300 05/22/21 0303 05/22/21 1553 05/22/21 1555 05/22/21 1939 05/23/21 0320 05/23/21 0324  WBC 15.9*  --  13.1*  --   --  12.1*  --   RBC 2.70*  --  2.75*  --   --  2.63*  --   HGB 7.7*   < > 8.1*   < > 8.5* 7.9* 7.8*  HCT 24.3*   < > 24.4*   < > 25.0* 23.7* 23.0*  MCV 90.0  --  88.7  --   --  90.1  --   MCH 28.5  --  29.5  --   --  30.0  --   MCHC 31.7  --  33.2  --   --  33.3  --   RDW 15.2  --  15.1  --   --  15.3  --   PLT 179  --  178  --   --  175  --    < > = values in this interval not displayed.    Thyroid No results for input(s): TSH, FREET4 in the last 168 hours.  BNPNo results for input(s): BNP, PROBNP in the last 168 hours.  DDimer No results for input(s): DDIMER in the last 168 hours.   Radiology    CARDIAC CATHETERIZATION  Result Date: 06/01/2021 Successful VV ECMO cannulation and initiation.   DG Chest Port 1 View  Result Date: 05/23/2021 CLINICAL DATA:  Chest pain, shortness of breath EXAM: PORTABLE CHEST 1 VIEW COMPARISON:  Previous studies including the examination of 05/22/2021 FINDINGS: Transverse diameter of heart is increased. Tip of endotracheal  tube is 6.7 cm above the carina. NG tube is noted traversing the esophagus. Tip of central venous catheter is seen in the superior vena cava. There is a large caliber catheter following the course of right IJ superior vena cava, right atrium and inferior vena cava. Extensive patchy interstitial and alveolar  densities seen in both lungs. There is interval worsening of infiltrates in the right lower lung fields. There is slight decrease in alveolar densities in both upper lung fields. There is blunting of both lateral CP angles. There is no pneumothorax. IMPRESSION: There is interval worsening of infiltrates in the right lower lung fields. Diffuse extensive increased markings are seen throughout both lungs suggesting pulmonary edema and possibly underlying pneumonia. Small bilateral pleural effusions. Electronically Signed   By: Elmer Picker M.D.   On: 05/23/2021 08:15   DG Chest Port 1 View  Result Date: 05/22/2021 CLINICAL DATA:  ECMO, intubated EXAM: PORTABLE CHEST 1 VIEW COMPARISON:  Chest radiograph from one day prior. FINDINGS: Endotracheal tube tip is 5.5 cm above the carina. Enteric tube enters stomach with the tip not seen on this image. Left subclavian central venous catheter terminates over the lower third of the SVC. ECMO catheter in place in the medial right chest. Stable cardiomediastinal silhouette with mild cardiomegaly. No pneumothorax. Severe patchy consolidation throughout both lungs, perhaps slightly improved at the right lung base. Biapical pleural thickening, unchanged. IMPRESSION: 1. Well-positioned support structures. No pneumothorax. 2. Severe patchy consolidation throughout both lungs, perhaps slightly improved at the right lung base. 3. Stable biapical pleural thickening, potentially loculated pleural effusions versus scarring. Electronically Signed   By: Ilona Sorrel M.D.   On: 05/22/2021 08:24   DG Chest Port 1 View  Result Date: 06/06/2021 CLINICAL DATA:  Congestive heart failure EXAM: PORTABLE CHEST 1 VIEW COMPARISON:  05/26/2021 at 2:50 p.m. FINDINGS: Single frontal view of the chest demonstrates stable endotracheal tube, enteric catheter, and left subclavian catheter. The cardiac silhouette is enlarged. There has been interval progression of the bilateral interstitial and  ground-glass opacities. Continued biapical densities may reflect pleural thickening or pleural fluid. Right posterior second rib fracture unchanged. IMPRESSION: 1. Progressive bilateral airspace disease consistent with worsening edema or infection. 2. Stable biapical densities consistent with pleural fluid. 3. Stable support devices. Electronically Signed   By: Randa Ngo M.D.   On: 06/02/2021 17:52   DG CHEST PORT 1 VIEW  Result Date: 05/30/2021 CLINICAL DATA:  Central line EXAM: PORTABLE CHEST 1 VIEW COMPARISON:  06/04/2021 radiograph FINDINGS: Endotracheal tube overlies the midthoracic trachea. There is a right upper extremity PICC with tip overlying the distal superior vena cava. There is a right neck catheter sheath present without internal catheter. There is a new left subclavian catheter with tip overlying the distal superior vena cava. Feeding tube passes below the diaphragm, tip excluded by collimation. Prior median sternotomy and CABG. Unchanged cardiomediastinal silhouette. There are diffuse interstitial and airspace opacities, not significantly changed since the prior exam. Small bilateral pleural effusions, unchanged. No visible pneumothorax. Unchanged right first and second rib fractures. IMPRESSION: New left subclavian catheter tip overlies the distal superior vena cava. Unchanged diffuse interstitial and airspace disease consistent with pulmonary edema and/or multifocal infection. Electronically Signed   By: Maurine Simmering M.D.   On: 05/25/2021 15:03   ECHOCARDIOGRAM LIMITED  Result Date: 05/23/2021    ECHOCARDIOGRAM LIMITED REPORT   Patient Name:   Randall Hodges Date of Exam: 06/09/2021 Medical Rec #:  [id number]  Height:       70.0 in Accession #:    [id number]      Weight:       185.6 lb Date of Birth:  10-03-1950       BSA:          2.022 m Patient Age:    59 years        BP:           125/46 mmHg Patient Gender: M               HR:           94 bpm. Exam Location:  Inpatient  Procedure: Limited Echo, Cardiac Doppler and Color Doppler Indications:    CHF-Acute Systolic U38.45  History:        Patient has prior history of Echocardiogram examinations, most                 recent 04/23/2021. CAD, Prior CABG; Arrythmias:Atypical atrial                 flutter/PVCs. Acute kidney injury.  Sonographer:    Darlina Sicilian RDCS Referring Phys: [place] Comments: Echo performed with patient supine and on artificial respirator. Going for ECMO cannulation IMPRESSIONS  1. Poor acoustic windows. Septal flattening consistent with RV volume/pressure overload LVEF appears moderate to severely depressed (images 23,24) Inferolateral wall is akinetic mid; anterosept is dyskinetic mid / distal.  2. Right ventricular systolic function is mildly reduced. The right ventricular size is normal. There is severely elevated pulmonary artery systolic pressure.  3. No significant variation in mitral inflow with respiration.Marland Kitchen a small pericardial effusion is present. Large pleural effusion in the left lateral region.  4. The mitral valve is normal in structure. Trivial mitral valve regurgitation.  5. TR is eccentric. Limited evaluation from RV inflow view. Doppler images suggest TR is at least moderate in severity.  6. The aortic valve is tricuspid. Aortic valve regurgitation is mild. FINDINGS  Left Ventricle: Poor acoustic windows. Septal flattening consistent with RV volume/pressure overload LVEF appears moderate to severely depressed (images 23,24) Inferolateral wall is akinetic mid; anterosept is dyskinetic mid / distal. Right Ventricle: The right ventricular size is normal. Right ventricular systolic function is mildly reduced. There is severely elevated pulmonary artery systolic pressure. The tricuspid regurgitant velocity is 3.64 m/s, and with an assumed right atrial pressure of 8 mmHg, the estimated right ventricular systolic pressure is 36.4 mmHg. Right Atrium: Right atrial size was normal  in size. Pericardium: No significant variation in mitral inflow with respiration. A small pericardial effusion is present. Mitral Valve: The mitral valve is normal in structure. There is mild thickening of the mitral valve leaflet(s). Mild to moderate mitral annular calcification. Trivial mitral valve regurgitation. Tricuspid Valve: TR is eccentric. Limited evaluation from RV inflow view. Doppler images suggest TR is at least moderate in severity. The tricuspid valve is normal in structure. Aortic Valve: The aortic valve is tricuspid. Aortic valve regurgitation is mild. Pulmonic Valve: The pulmonic valve was normal in structure. Additional Comments: There is a large pleural effusion in the left lateral region. TRICUSPID VALVE TR Peak grad:   53.0 mmHg TR Vmax:        364.00 cm/s Dorris Carnes MD Electronically signed by Dorris Carnes MD Signature Date/Time: 06/05/2021/3:36:44 PM    Final     Cardiac Studies   Cath 05/02/2021 Distal left main [place] 111 bifurcation stenosis with 75% left main, 90%  ostial to proximal LAD, and 80-90% ostial circumflex (difficult to assess due to heavy calcification). Severe mid circumflex disease with 70% eccentric mid stenosis and second obtuse marginal containing ostial to proximal greater than 80% stenosis.  (Bifurcation [place] 111 Severe calcification in left main and LAD in particular with diffuse 50% narrowing from proximal to mid vessel and tandem 70% stenoses in the mid LAD. Nondominant right coronary Normal LV function.  EF 55%.  LVEDP normal.    Patient Profile     71 y.o. male with PMH of PVCs presented with chest pain. Cardiac cath by Dr. Tamala Julian on 05/10/2021 showed 75% left main, 90% ost to prox LAD, 80-90% ost LCx, 70% mid LCx, 80% OM2, 50% prox to mid LAD, 70% mid LAD, EF 55%. Patient underwent CABG x 3 on 04/27/2021. Post op course complicated afib, treated with amio. CXR showed opacity in bilateral lung, started abx on 1/25 and diuretic. Started on Eliquis due to  recurrence of afib.    Assessment & Plan    1. CAD: Admitted with unstable angina, cath with severe left main and proximal LAD/LCx disease (nondominant RCA).  CABG x 3 on 1/18 with LIMA-LAD, SVG-OM, SVG-left PDA.  No s/s angina  - Continue ASA 81, statin.  2. 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 RHC 1/30 with equalization of diastolic pressures.  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  Repeated echo 2/2 still showed respirophasic septal variation but not as impressive. CVP 15 today with I/Os positive, co-ox 64%. Weight up 13 pounds from baseline - Lasix 80 mg IV today Can consider lasix gtt - With concern for development of post-surgical effusive/constrictive pericarditis, he is on colchicine.  - ?need for surgical intervention on effusive/constrictive pericarditis. Will need improvement of lung disease first then reassess, repeat echo not as impressive.   3. 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  Probably primarily septic/vasodilatory shock. He is currently on NE 12 -> 3, vasopressin 0.03 with stable MAP. Lactate normal.  - Continue to wean as able 4. Hiccups: Had been intractable, now seem resolved.  5. PNA: Of note, he does appear to have had some pre-existing ILD from 2019 CT chest (?sarcoidosis). CXR with persistent bilateral infiltrates, possible mild improvement compared to yesterday.  Have thought most likely aspiration PNA/pneumonitis in setting of intractable hiccups. He has developred ARDS.  Afebrile. COVID was negative.   ABG improved.  - Discussed with pulmonary, think amiodarone toxicity unlikely as he has only been on it post-op.  - Continue meropenem/vancomycin.  - Vent per CCM.  - Steroids per CCM 6. Atypical atrial flutter/PVCs:  remains in NSR - Continue amiodarone 30 mg/hr. - Bivalirudin gtt.  Discussed dosing with PharmD personally. 7. AKI: Creatinine 1.87 => 1.69 => 1.3.  Follow closely.  8. Elevated LFTs: Suspect shock liver, follow CMET.   CRITICAL CARE Performed by: Glori Bickers  Total critical care time: 40 minutes  Critical care time was exclusive of separately billable procedures and treating other patients.  Critical care was necessary to treat or prevent imminent or life-threatening deterioration.  Critical care was time spent personally by me on the following activities: development of treatment plan with  patient and/or surrogate as well as nursing, discussions with consultants, evaluation of patient's response to treatment, examination of patient, obtaining history from patient or surrogate, ordering and performing treatments and interventions, ordering and review of laboratory studies, ordering and review of radiographic studies, pulse oximetry and re-evaluation of patient's condition.  Glori Bickers MD 05/23/2021 9:08 AM

## 2021-05-23 NOTE — Progress Notes (Signed)
Pharmacy Antibiotic Note  Randall Hodges is a 71 y.o. male admitted on 05/12/2021 with pneumonia, ARDS.  Pharmacy has been consulted for vancomycin and zosyn.  Due to worsening status 05/29/2021 with rising WBC and persistent infiltrates on CXR, pharmacy asked to broaden to vancomycin + meropenem.  0100 VP = 35 _0  (dose _1 ) 1000 VT= 18 @ 1000 Calculated with this dose= 663  Will adjust vancomycin dose to give calculated AUC of 442 with 500 mq q12h.   Plan: Vancomycin 500  mg IV q 12 hrs. Meropenem 1g IV q 8 hrs  Height: _2  (177.8 cm) Weight: 84.6 kg (186 lb 8.2 oz) IBW/kg (Calculated) : 73  Temp (24hrs), Avg:98.2 F (36.8 C), Min:98.1 F (36.7 C), Max:98.6 F (37 C)  Recent Labs  Lab 06/15/2021 0314 06/02/2021 0746 06/12/2021 0954 06/01/2021 1711 05/22/2021 2300 05/22/21 0300 05/22/21 1553 05/23/21 0115 05/23/21 0320 05/23/21 1000  WBC 24.2*  --   --  20.4* 16.3* 15.9* 13.1*  --  12.1*  --   CREATININE 1.37*  --   --  1.87*  --  1.69* 1.51*  --  1.30*  --   LATICACIDVEN  --  1.9 2.3* 2.8*  --  1.7  --   --  1.2  --   VANCOTROUGH  --   --   --   --   --   --   --   --   --  18  VANCOPEAK  --   --   --   --   --   --   --  35  --   --      Estimated Creatinine Clearance: 53.8 mL/min (A) (by C-G formula based on SCr of 1.3 mg/dL (H)).    Allergies  Allergen Reactions   Amoxicillin-Pot Clavulanate     Per pt report on 08/19/20, makes his urine dark colored   Atorvastatin Other (See Comments)     ( pt states causing runny nose, headaches, issue w/ urination)   Plant Derived Enzymes     Other reaction(s): Unknown   Trichophyton Other (See Comments)    Antimicrobials this admission: cefepime 1/29 >> 1/30 Zosyn 1/30 >> 2/2 Vanc 1/31 >>  Meropenem 2/2 >   Dose adjustments this admission: 2/4 Vancomycin 750 mg q12h >> 500 q12h based on VT/VP  Microbiology results: 2/2 BAL: ngtd 2/2 resp panel: neg 2/1 covid- negative 2/1 MRSA PCR- negative 2/1 flu negative 1/31  ESR 55  Cathrine Muster, PharmD PGY2 Cardiology Pharmacy Resident Phone: [phone] 05/23/2021  11:49 AM  Please check [email or web address] for unit-specific pharmacy phone numbers.

## 2021-05-23 NOTE — Progress Notes (Addendum)
ANTICOAGULATION CONSULT NOTE -  Pharmacy Consult for Bivalirudin Indication:  ECMO  Allergies  Allergen Reactions   Amoxicillin-Pot Clavulanate     Per pt report on 08/19/20, makes his urine dark colored   Atorvastatin Other (See Comments)     ( pt states causing runny nose, headaches, issue w/ urination)   Plant Derived Enzymes     Other reaction(s): Unknown   Trichophyton Other (See Comments)    Patient Measurements: Height: 5\' 10"  (177.8 cm) Weight: 84.6 kg (186 lb 8.2 oz) IBW/kg (Calculated) : 73  Vital Signs: Temp: 98.4 F (36.9 C) (02/04 1645) Temp Source: Bladder (02/04 1600) BP: 101/60 (02/04 1600) Pulse Rate: 81 (02/04 1645)  Labs: Recent Labs    06/03/2021 1711 06/08/2021 1803 05/27/2021 2300 05/22/21 0300 05/22/21 0303 05/22/21 1122 05/22/21 1125 05/22/21 1553 05/22/21 1555 05/23/21 0320 05/23/21 0324 05/23/21 1028 05/23/21 1605 05/23/21 1607  HGB 7.6*   < > 7.8* 7.7*   < >  --    < > 8.1*   < > 7.9*   < > 8.2* 7.7* 8.2*  HCT 23.8*   < > 23.2* 24.3*   < >  --    < > 24.4*   < > 23.7*   < > 24.0* 24.1* 24.0*  PLT 185  --  181 179  --   --   --  178  --  175  --   --  187  --   APTT 166*  --  62* 59*  --  60*  --  58*  --  55*  --   --  53*  --   LABPROT 22.5*  --   --  25.1*  --   --   --   --   --  22.1*  --   --   --   --   INR 2.0*  --   --  2.3*  --   --   --   --   --  1.9*  --   --   --   --   HEPARINUNFRC 0.68  --  0.18* 0.13*  --  0.10*  --   --   --   --   --   --   --   --   CREATININE 1.87*  --   --  1.69*  --   --   --  1.51*  --  1.30*  --   --  1.34*  --    < > = values in this interval not displayed.    Estimated Creatinine Clearance: 52.2 mL/min (A) (by C-G formula based on SCr of 1.34 mg/dL (H)).   Medical History: Past Medical History:  Diagnosis Date   Allergic rhinitis    Prostate cancer ([place])     Medications:  Infusions:   sodium chloride 10 mL/hr at 05/23/21 1600   albumin human     amiodarone 30 mg/hr (05/23/21 1600)    bivalirudin (ANGIOMAX) infusion 0.5 mg/mL (Non-ACS indications) 0.03 mg/kg/hr (05/23/21 1600)   dexmedetomidine (PRECEDEX) IV infusion 0.5 mcg/kg/hr (05/23/21 1600)   feeding supplement (VITAL 1.5 CAL) 1,000 mL (05/23/21 0537)   fentaNYL infusion INTRAVENOUS 100 mcg/hr (05/23/21 1600)   meropenem (MERREM) IV Stopped (05/23/21 1006)   norepinephrine (LEVOPHED) Adult infusion 2 mcg/min (05/23/21 1600)   vancomycin     vasopressin 0.03 Units/min (05/23/21 1600)    Assessment: 71 year old male with coronary artery disease who initially presented with chest pain, noted to have multivessel  coronary artery disease, he underwent CABG x3 on 04/1818 23, course was complicated with atrial fibrillation, frequent hiccups and aspiration pneumonia leading to hypoxia and increasing oxygen requirement. Patient now requiring VV ECMO. Pharmacy consulted for bivalirudin IV.   Aptt therapeutic at 53 sec (on bival 0.03 mg/kg/hr). Hemoglobin low and stable, Platelets stable.  No issues with infusion or bleeding per RN.  Goal of Therapy:  aPTT 50-80 seconds Monitor platelets by anticoagulation protocol: Yes   Plan:  Continue bivalirudin at 0.03 mg/kg/hr (5.1 ml/hr) Aptt and CBC q12 hour checks   Thank you for allowing pharmacy to be a part of this patients care.  Benetta Spar, PharmD, BCPS, BCCP Clinical Pharmacist  Please check AMION for all [place] phone numbers After 10:00 PM, call [place] [phone]

## 2021-05-23 NOTE — Progress Notes (Signed)
SLP Cancellation Note  Patient Details Name: Randall Hodges MRN: [id number] DOB: November 17, 1950   Cancelled treatment and D/C note: Pt remains intubated; SLP services will sign off. Please reconsult when needed. Thank you.  Hersh Minney L. Tivis Ringer, Angie CCC/SLP Acute Rehabilitation Services Office number [phone] Pager [phone]          [name] 05/23/2021, 8:28 AM

## 2021-05-24 ENCOUNTER — Inpatient Hospital Stay (HOSPITAL_COMMUNITY): Payer: PPO

## 2021-05-24 DIAGNOSIS — I249 Acute ischemic heart disease, unspecified: Secondary | ICD-10-CM | POA: Diagnosis not present

## 2021-05-24 DIAGNOSIS — J9601 Acute respiratory failure with hypoxia: Secondary | ICD-10-CM | POA: Diagnosis not present

## 2021-05-24 LAB — SEDIMENTATION RATE: Sed Rate: 40 mm/hr — ABNORMAL HIGH (ref 0–16)

## 2021-05-24 LAB — POCT I-STAT 7, (LYTES, BLD GAS, ICA,H+H)
Acid-Base Excess: 10 mmol/L — ABNORMAL HIGH (ref 0.0–2.0)
Acid-Base Excess: 7 mmol/L — ABNORMAL HIGH (ref 0.0–2.0)
Acid-Base Excess: 10 mmol/L — ABNORMAL HIGH (ref 0.0–2.0)
Acid-Base Excess: 10 mmol/L — ABNORMAL HIGH (ref 0.0–2.0)
Acid-Base Excess: 11 mmol/L — ABNORMAL HIGH (ref 0.0–2.0)
Acid-Base Excess: 13 mmol/L — ABNORMAL HIGH (ref 0.0–2.0)
Acid-Base Excess: 10 mmol/L — ABNORMAL HIGH (ref 0.0–2.0)
Acid-Base Excess: 12 mmol/L — ABNORMAL HIGH (ref 0.0–2.0)
Acid-Base Excess: 12 mmol/L — ABNORMAL HIGH (ref 0.0–2.0)
Acid-Base Excess: 11 mmol/L — ABNORMAL HIGH (ref 0.0–2.0)
Bicarbonate: 35.9 mmol/L — ABNORMAL HIGH (ref 20.0–28.0)
Bicarbonate: 33.7 mmol/L — ABNORMAL HIGH (ref 20.0–28.0)
Bicarbonate: 36.1 mmol/L — ABNORMAL HIGH (ref 20.0–28.0)
Bicarbonate: 36.7 mmol/L — ABNORMAL HIGH (ref 20.0–28.0)
Bicarbonate: 36.5 mmol/L — ABNORMAL HIGH (ref 20.0–28.0)
Bicarbonate: 38.3 mmol/L — ABNORMAL HIGH (ref 20.0–28.0)
Bicarbonate: 35.6 mmol/L — ABNORMAL HIGH (ref 20.0–28.0)
Bicarbonate: 38.1 mmol/L — ABNORMAL HIGH (ref 20.0–28.0)
Bicarbonate: 38.1 mmol/L — ABNORMAL HIGH (ref 20.0–28.0)
Bicarbonate: 37.6 mmol/L — ABNORMAL HIGH (ref 20.0–28.0)
Calcium, Ion: 1.16 mmol/L (ref 1.15–1.40)
Calcium, Ion: 1.15 mmol/L (ref 1.15–1.40)
Calcium, Ion: 1.14 mmol/L — ABNORMAL LOW (ref 1.15–1.40)
Calcium, Ion: 1.15 mmol/L (ref 1.15–1.40)
Calcium, Ion: 1.15 mmol/L (ref 1.15–1.40)
Calcium, Ion: 1.13 mmol/L — ABNORMAL LOW (ref 1.15–1.40)
Calcium, Ion: 1.17 mmol/L (ref 1.15–1.40)
Calcium, Ion: 1.15 mmol/L (ref 1.15–1.40)
Calcium, Ion: 1.13 mmol/L — ABNORMAL LOW (ref 1.15–1.40)
Calcium, Ion: 1.19 mmol/L (ref 1.15–1.40)
HCT: 22 % — ABNORMAL LOW (ref 39.0–52.0)
HCT: 24 % — ABNORMAL LOW (ref 39.0–52.0)
HCT: 28 % — ABNORMAL LOW (ref 39.0–52.0)
HCT: 27 % — ABNORMAL LOW (ref 39.0–52.0)
HCT: 28 % — ABNORMAL LOW (ref 39.0–52.0)
HCT: 27 % — ABNORMAL LOW (ref 39.0–52.0)
HCT: 27 % — ABNORMAL LOW (ref 39.0–52.0)
HCT: 27 % — ABNORMAL LOW (ref 39.0–52.0)
HCT: 27 % — ABNORMAL LOW (ref 39.0–52.0)
HCT: 27 % — ABNORMAL LOW (ref 39.0–52.0)
Hemoglobin: 7.5 g/dL — ABNORMAL LOW (ref 13.0–17.0)
Hemoglobin: 8.2 g/dL — ABNORMAL LOW (ref 13.0–17.0)
Hemoglobin: 9.5 g/dL — ABNORMAL LOW (ref 13.0–17.0)
Hemoglobin: 9.2 g/dL — ABNORMAL LOW (ref 13.0–17.0)
Hemoglobin: 9.5 g/dL — ABNORMAL LOW (ref 13.0–17.0)
Hemoglobin: 9.2 g/dL — ABNORMAL LOW (ref 13.0–17.0)
Hemoglobin: 9.2 g/dL — ABNORMAL LOW (ref 13.0–17.0)
Hemoglobin: 9.2 g/dL — ABNORMAL LOW (ref 13.0–17.0)
Hemoglobin: 9.2 g/dL — ABNORMAL LOW (ref 13.0–17.0)
Hemoglobin: 9.2 g/dL — ABNORMAL LOW (ref 13.0–17.0)
O2 Saturation: 92 %
O2 Saturation: 91 %
O2 Saturation: 94 %
O2 Saturation: 91 %
O2 Saturation: 91 %
O2 Saturation: 100 %
O2 Saturation: 94 %
O2 Saturation: 99 %
O2 Saturation: 99 %
O2 Saturation: 98 %
Patient temperature: 36.9
Patient temperature: 98.1
Patient temperature: 36.6
Patient temperature: 36.7
Patient temperature: 36.6
Patient temperature: 36.7
Patient temperature: 36.6
Patient temperature: 36.5
Patient temperature: 36.6
Patient temperature: 36.8
Potassium: 3.9 mmol/L (ref 3.5–5.1)
Potassium: 3.7 mmol/L (ref 3.5–5.1)
Potassium: 4.4 mmol/L (ref 3.5–5.1)
Potassium: 4.2 mmol/L (ref 3.5–5.1)
Potassium: 4.1 mmol/L (ref 3.5–5.1)
Potassium: 4 mmol/L (ref 3.5–5.1)
Potassium: 4 mmol/L (ref 3.5–5.1)
Potassium: 3.8 mmol/L (ref 3.5–5.1)
Potassium: 3.8 mmol/L (ref 3.5–5.1)
Potassium: 3.9 mmol/L (ref 3.5–5.1)
Sodium: 142 mmol/L (ref 135–145)
Sodium: 145 mmol/L (ref 135–145)
Sodium: 144 mmol/L (ref 135–145)
Sodium: 144 mmol/L (ref 135–145)
Sodium: 145 mmol/L (ref 135–145)
Sodium: 146 mmol/L — ABNORMAL HIGH (ref 135–145)
Sodium: 143 mmol/L (ref 135–145)
Sodium: 145 mmol/L (ref 135–145)
Sodium: 145 mmol/L (ref 135–145)
Sodium: 146 mmol/L — ABNORMAL HIGH (ref 135–145)
TCO2: 38 mmol/L — ABNORMAL HIGH (ref 22–32)
TCO2: 36 mmol/L — ABNORMAL HIGH (ref 22–32)
TCO2: 38 mmol/L — ABNORMAL HIGH (ref 22–32)
TCO2: 39 mmol/L — ABNORMAL HIGH (ref 22–32)
TCO2: 38 mmol/L — ABNORMAL HIGH (ref 22–32)
TCO2: 40 mmol/L — ABNORMAL HIGH (ref 22–32)
TCO2: 37 mmol/L — ABNORMAL HIGH (ref 22–32)
TCO2: 40 mmol/L — ABNORMAL HIGH (ref 22–32)
TCO2: 40 mmol/L — ABNORMAL HIGH (ref 22–32)
TCO2: 39 mmol/L — ABNORMAL HIGH (ref 22–32)
pCO2 arterial: 57.4 mmHg — ABNORMAL HIGH (ref 32.0–48.0)
pCO2 arterial: 59 mmHg — ABNORMAL HIGH (ref 32.0–48.0)
pCO2 arterial: 57.9 mmHg — ABNORMAL HIGH (ref 32.0–48.0)
pCO2 arterial: 59 mmHg — ABNORMAL HIGH (ref 32.0–48.0)
pCO2 arterial: 51.9 mmHg — ABNORMAL HIGH (ref 32.0–48.0)
pCO2 arterial: 51.4 mmHg — ABNORMAL HIGH (ref 32.0–48.0)
pCO2 arterial: 52.1 mmHg — ABNORMAL HIGH (ref 32.0–48.0)
pCO2 arterial: 59.7 mmHg — ABNORMAL HIGH (ref 32.0–48.0)
pCO2 arterial: 54.2 mmHg — ABNORMAL HIGH (ref 32.0–48.0)
pCO2 arterial: 60.1 mmHg — ABNORMAL HIGH (ref 32.0–48.0)
pH, Arterial: 7.404 (ref 7.350–7.450)
pH, Arterial: 7.364 (ref 7.350–7.450)
pH, Arterial: 7.401 (ref 7.350–7.450)
pH, Arterial: 7.401 (ref 7.350–7.450)
pH, Arterial: 7.453 — ABNORMAL HIGH (ref 7.350–7.450)
pH, Arterial: 7.479 — ABNORMAL HIGH (ref 7.350–7.450)
pH, Arterial: 7.441 (ref 7.350–7.450)
pH, Arterial: 7.411 (ref 7.350–7.450)
pH, Arterial: 7.453 — ABNORMAL HIGH (ref 7.350–7.450)
pH, Arterial: 7.403 (ref 7.350–7.450)
pO2, Arterial: 66 mmHg — ABNORMAL LOW (ref 83.0–108.0)
pO2, Arterial: 65 mmHg — ABNORMAL LOW (ref 83.0–108.0)
pO2, Arterial: 73 mmHg — ABNORMAL LOW (ref 83.0–108.0)
pO2, Arterial: 63 mmHg — ABNORMAL LOW (ref 83.0–108.0)
pO2, Arterial: 59 mmHg — ABNORMAL LOW (ref 83.0–108.0)
pO2, Arterial: 280 mmHg — ABNORMAL HIGH (ref 83.0–108.0)
pO2, Arterial: 69 mmHg — ABNORMAL LOW (ref 83.0–108.0)
pO2, Arterial: 159 mmHg — ABNORMAL HIGH (ref 83.0–108.0)
pO2, Arterial: 141 mmHg — ABNORMAL HIGH (ref 83.0–108.0)
pO2, Arterial: 106 mmHg (ref 83.0–108.0)

## 2021-05-24 LAB — CBC
HCT: 23.6 % — ABNORMAL LOW (ref 39.0–52.0)
HCT: 29.1 % — ABNORMAL LOW (ref 39.0–52.0)
Hemoglobin: 7.4 g/dL — ABNORMAL LOW (ref 13.0–17.0)
Hemoglobin: 9.1 g/dL — ABNORMAL LOW (ref 13.0–17.0)
MCH: 29.1 pg (ref 26.0–34.0)
MCH: 27.8 pg (ref 26.0–34.0)
MCHC: 31.4 g/dL (ref 30.0–36.0)
MCHC: 31.3 g/dL (ref 30.0–36.0)
MCV: 92.9 fL (ref 80.0–100.0)
MCV: 89 fL (ref 80.0–100.0)
Platelets: 165 10*3/uL (ref 150–400)
Platelets: 186 10*3/uL (ref 150–400)
RBC: 2.54 MIL/uL — ABNORMAL LOW (ref 4.22–5.81)
RBC: 3.27 MIL/uL — ABNORMAL LOW (ref 4.22–5.81)
RDW: 15.4 % (ref 11.5–15.5)
RDW: 16.2 % — ABNORMAL HIGH (ref 11.5–15.5)
WBC: 9.4 10*3/uL (ref 4.0–10.5)
WBC: 11.1 10*3/uL — ABNORMAL HIGH (ref 4.0–10.5)
nRBC: 0 % (ref 0.0–0.2)
nRBC: 0.2 % (ref 0.0–0.2)

## 2021-05-24 LAB — BASIC METABOLIC PANEL
Anion gap: 9 (ref 5–15)
Anion gap: 7 (ref 5–15)
BUN: 61 mg/dL — ABNORMAL HIGH (ref 8–23)
BUN: 60 mg/dL — ABNORMAL HIGH (ref 8–23)
CO2: 33 mmol/L — ABNORMAL HIGH (ref 22–32)
CO2: 34 mmol/L — ABNORMAL HIGH (ref 22–32)
Calcium: 7.7 mg/dL — ABNORMAL LOW (ref 8.9–10.3)
Calcium: 7.8 mg/dL — ABNORMAL LOW (ref 8.9–10.3)
Chloride: 100 mmol/L (ref 98–111)
Chloride: 106 mmol/L (ref 98–111)
Creatinine, Ser: 1.34 mg/dL — ABNORMAL HIGH (ref 0.61–1.24)
Creatinine, Ser: 1.25 mg/dL — ABNORMAL HIGH (ref 0.61–1.24)
GFR, Estimated: 57 mL/min — ABNORMAL LOW (ref >60)
GFR, Estimated: >60 mL/min (ref >60)
Glucose, Bld: 195 mg/dL — ABNORMAL HIGH (ref 70–99)
Glucose, Bld: 163 mg/dL — ABNORMAL HIGH (ref 70–99)
Potassium: 4.2 mmol/L (ref 3.5–5.1)
Potassium: 4.2 mmol/L (ref 3.5–5.1)
Sodium: 142 mmol/L (ref 135–145)
Sodium: 147 mmol/L — ABNORMAL HIGH (ref 135–145)

## 2021-05-24 LAB — MRSA NEXT GEN BY PCR, NASAL: MRSA by PCR Next Gen: NOT DETECTED

## 2021-05-24 LAB — HEPATIC FUNCTION PANEL
ALT: 255 U/L — ABNORMAL HIGH (ref 0–44)
AST: 134 U/L — ABNORMAL HIGH (ref 15–41)
Albumin: 2 g/dL — ABNORMAL LOW (ref 3.5–5.0)
Alkaline Phosphatase: 121 U/L (ref 38–126)
Bilirubin, Direct: 0.3 mg/dL — ABNORMAL HIGH (ref 0.0–0.2)
Indirect Bilirubin: 0.8 mg/dL (ref 0.3–0.9)
Total Bilirubin: 1.1 mg/dL (ref 0.3–1.2)
Total Protein: 4.3 g/dL — ABNORMAL LOW (ref 6.5–8.1)

## 2021-05-24 LAB — COOXEMETRY PANEL
Carboxyhemoglobin: 1.5 % (ref 0.5–1.5)
Methemoglobin: 1.1 % (ref 0.0–1.5)
O2 Saturation: 62.1 %
Total hemoglobin: 7.7 g/dL — ABNORMAL LOW (ref 12.0–16.0)

## 2021-05-24 LAB — APTT
aPTT: 55 seconds — ABNORMAL HIGH (ref 24–36)
aPTT: 53 seconds — ABNORMAL HIGH (ref 24–36)

## 2021-05-24 LAB — GLUCOSE, CAPILLARY
Glucose-Capillary: 174 mg/dL — ABNORMAL HIGH (ref 70–99)
Glucose-Capillary: 164 mg/dL — ABNORMAL HIGH (ref 70–99)
Glucose-Capillary: 150 mg/dL — ABNORMAL HIGH (ref 70–99)
Glucose-Capillary: 167 mg/dL — ABNORMAL HIGH (ref 70–99)
Glucose-Capillary: 159 mg/dL — ABNORMAL HIGH (ref 70–99)
Glucose-Capillary: 147 mg/dL — ABNORMAL HIGH (ref 70–99)

## 2021-05-24 LAB — LACTIC ACID, PLASMA
Lactic Acid, Venous: 1.3 mmol/L (ref 0.5–1.9)
Lactic Acid, Venous: 1.6 mmol/L (ref 0.5–1.9)

## 2021-05-24 LAB — MAGNESIUM: Magnesium: 2.2 mg/dL (ref 1.7–2.4)

## 2021-05-24 LAB — FIBRINOGEN: Fibrinogen: 589 mg/dL — ABNORMAL HIGH (ref 210–475)

## 2021-05-24 LAB — PROTIME-INR
INR: 2 — ABNORMAL HIGH (ref 0.8–1.2)
Prothrombin Time: 22.3 seconds — ABNORMAL HIGH (ref 11.4–15.2)

## 2021-05-24 LAB — LACTATE DEHYDROGENASE: LDH: 428 U/L — ABNORMAL HIGH (ref 98–192)

## 2021-05-24 MED ORDER — MIDAZOLAM HCL 2 MG/2ML IJ SOLN
INTRAMUSCULAR | Status: AC
  Administered 2021-05-24: 2 mg via INTRAVENOUS
  Filled 2021-05-24: qty 2

## 2021-05-24 MED ORDER — DEXMEDETOMIDINE HCL IN NACL 400 MCG/100ML IV SOLN
0.4000 ug/kg/h | INTRAVENOUS | Status: DC
  Administered 2021-05-24 (×2): 0.5 ug/kg/h via INTRAVENOUS
  Administered 2021-05-24 – 2021-05-25 (×4): 0.7 ug/kg/h via INTRAVENOUS
  Administered 2021-05-26 – 2021-06-01 (×3): 0.5 ug/kg/h via INTRAVENOUS
  Administered 2021-06-01 – 2021-06-02 (×3): 1.2 ug/kg/h via INTRAVENOUS
  Administered 2021-06-02: 0.8 ug/kg/h via INTRAVENOUS
  Administered 2021-06-02: 0.6 ug/kg/h via INTRAVENOUS
  Administered 2021-06-03: 1 ug/kg/h via INTRAVENOUS
  Administered 2021-06-03 (×2): 0.8 ug/kg/h via INTRAVENOUS
  Administered 2021-06-03: 1 ug/kg/h via INTRAVENOUS
  Administered 2021-06-03: 1.2 ug/kg/h via INTRAVENOUS
  Administered 2021-06-04: 0.7 ug/kg/h via INTRAVENOUS
  Administered 2021-06-04: 1.2 ug/kg/h via INTRAVENOUS
  Administered 2021-06-04: 0.9 ug/kg/h via INTRAVENOUS
  Administered 2021-06-04: 1.2 ug/kg/h via INTRAVENOUS
  Administered 2021-06-05: 0.6 ug/kg/h via INTRAVENOUS
  Filled 2021-05-24 (×3): qty 100
  Filled 2021-05-24: qty 200
  Filled 2021-05-24 (×8): qty 100
  Filled 2021-05-24: qty 200
  Filled 2021-05-24: qty 100
  Filled 2021-05-24: qty 300
  Filled 2021-05-24: qty 200
  Filled 2021-05-24: qty 100
  Filled 2021-05-24: qty 200
  Filled 2021-05-24: qty 100

## 2021-05-24 MED ORDER — MIDODRINE HCL 5 MG PO TABS
5.0000 mg | ORAL_TABLET | Freq: Three times a day (TID) | ORAL | Status: DC
  Administered 2021-05-24 – 2021-05-26 (×7): 5 mg via NASOGASTRIC
  Filled 2021-05-24 (×8): qty 1

## 2021-05-24 MED ORDER — POLYVINYL ALCOHOL 1.4 % OP SOLN
1.0000 [drp] | OPHTHALMIC | Status: DC | PRN
  Administered 2021-05-29 – 2021-06-09 (×9): 1 [drp] via OPHTHALMIC
  Filled 2021-05-24: qty 15

## 2021-05-24 MED ORDER — FUROSEMIDE 10 MG/ML IJ SOLN
80.0000 mg | Freq: Two times a day (BID) | INTRAMUSCULAR | Status: AC
  Administered 2021-05-24 (×2): 80 mg via INTRAVENOUS
  Filled 2021-05-24 (×2): qty 8

## 2021-05-24 MED ORDER — SODIUM CHLORIDE 0.9% IV SOLUTION: Freq: Once | INTRAVENOUS | Status: DC

## 2021-05-24 MED ORDER — MIDODRINE HCL 5 MG PO TABS: 5.0000 mg | ORAL_TABLET | Freq: Three times a day (TID) | ORAL | Status: DC

## 2021-05-24 MED ORDER — MIDAZOLAM HCL 2 MG/2ML IJ SOLN: 2.0000 mg | Freq: Once | INTRAMUSCULAR | Status: AC

## 2021-05-24 MED ORDER — POTASSIUM CHLORIDE 10 MEQ/50ML IV SOLN
10.0000 meq | INTRAVENOUS | Status: AC
  Administered 2021-05-24 (×3): 10 meq via INTRAVENOUS
  Filled 2021-05-24 (×3): qty 50

## 2021-05-24 MED ORDER — MIDODRINE HCL 5 MG PO TABS
5.0000 mg | ORAL_TABLET | Freq: Three times a day (TID) | ORAL | Status: DC
  Administered 2021-05-24: 5 mg via ORAL
  Filled 2021-05-24: qty 1

## 2021-05-24 NOTE — Progress Notes (Signed)
° °   °  [address]       ,[place] [address]             [phone]                 3 Days Post-Op Procedure(s) (LRB): ECMO CANNULATION (Bilateral)   Events: No events overnight _______________________________________________________________ Vitals: BP (!) 103/55    Pulse 80    Temp 98.1 F (36.7 C) (Bladder)    Resp 15    Ht 5\' 10"  (1.778 m)    Wt 83.3 kg    SpO2 100%    BMI 26.35 kg/m  Filed Weights   05/22/21 0300 05/23/21 0411 05/24/21 0440  Weight: 80.8 kg 84.6 kg 83.3 kg     - Neuro: Sedated, but arousable  - Cardiovascular: Sinus  Drips: Amnio 30, levo at 2.   CVP:  [5 mmHg-9 mmHg] 8 mmHg  - Pulm:  Vent Mode: PRVC FiO2 (%):  [40 %-50 %] 50 % Set Rate:  [15 bmp] 15 bmp Vt Set:  [290 mL] 290 mL PEEP:  [5 cmH20-10 cmH20] 10 cmH20 Pressure Support:  [12 cmH20] 12 cmH20 Plateau Pressure:  [15 cmH20] 15 cmH20  ABG    Component Value Date/Time   PHART 7.364 05/24/2021 0759   PCO2ART 59.0 (H) 05/24/2021 0759   PO2ART 65 (L) 05/24/2021 0759   HCO3 33.7 (H) 05/24/2021 0759   TCO2 36 (H) 05/24/2021 0759   ACIDBASEDEF 0.6 06/11/2021 0950   O2SAT 91.0 05/24/2021 0759    - Abd: Nondistended - Extremity: Warm  .Intake/Output      02/04 0701 02/05 0700 02/05 0701 02/06 0700   I.V. (mL/kg) 1605.5 (19.3) 145.2 (1.7)   Other  30   NG/GT 1960 180   IV Piggyback 549.9 100   Total Intake(mL/kg) 4115.4 (49.4) 455.1 (5.5)   Urine (mL/kg/hr) 3550 (1.8) 750 (2.5)   Stool 1300    Total Output 4850 750   Net -734.6 -294.9           _______________________________________________________________ Labs: CBC Latest Ref Rng & Units 05/24/2021 05/24/2021 05/24/2021  WBC 4.0 - 10.5 K/uL - - 9.4  Hemoglobin 13.0 - 17.0 g/dL 8.2(L) 7.5(L) 7.4(L)  Hematocrit 39.0 - 52.0 % 24.0(L) 22.0(L) 23.6(L)  Platelets 150 - 400 K/uL - - 165   CMP Latest Ref Rng & Units 05/24/2021 05/24/2021 05/24/2021  Glucose 70 - 99 mg/dL - - 195(H)  BUN 8 - 23 mg/dL - - 61(H)   Creatinine 0.61 - 1.24 mg/dL - - 1.34(H)  Sodium 135 - 145 mmol/L 145 142 142  Potassium 3.5 - 5.1 mmol/L 3.7 3.9 4.2  Chloride 98 - 111 mmol/L - - 100  CO2 22 - 32 mmol/L - - 33(H)  Calcium 8.9 - 10.3 mg/dL - - 7.7(L)  Total Protein 6.5 - 8.1 g/dL - - 4.3(L)  Total Bilirubin 0.3 - 1.2 mg/dL - - 1.1  Alkaline Phos 38 - 126 U/L - - 121  AST 15 - 41 U/L - - 134(H)  ALT 0 - 44 U/L - - 255(H)    CXR: Diffuse interstitial process  _______________________________________________________________  Assessment and Plan: POD 17 s/p CABG.  Postop day 3 status post VV ECMO cannulation  Neuro: Maintain sedation CV: In sinus rhythm on amnio drip.  Remains on Levophed. Pulm: Continue ECMO support  Dispo: Continue ICU care   Lajuana Matte 05/24/2021 10:41 AM

## 2021-05-24 NOTE — Progress Notes (Addendum)
[facility] PCCM  Called to bedside to evaluate dropping oxygenation ECMO team increasing flow on pump  Vent mechanics as expected given condition bronch normal CXR unchanged ? Arterial line wave form reliability  Check lactic acid Bedside team to discuss with ECMO on call  I updated his wife by phone re bronchoscopy, oxygenation slightly worse and team is addressing  Additional cc time today 40 minutes outside procedures  Roselie Awkward, MD [place] P[facility]M Pager: [phone] Cell: [phone] After 7:00 pm call Elink  [phone]

## 2021-05-24 NOTE — Progress Notes (Signed)
ANTICOAGULATION CONSULT NOTE -  Pharmacy Consult for Bivalirudin Indication:  ECMO  Allergies  Allergen Reactions   Amoxicillin-Pot Clavulanate     Per pt report on 08/19/20, makes his urine dark colored   Atorvastatin Other (See Comments)     ( pt states causing runny nose, headaches, issue w/ urination)   Plant Derived Enzymes     Other reaction(s): Unknown   Trichophyton Other (See Comments)    Patient Measurements: Height: 5\' 10"  (177.8 cm) Weight: 83.3 kg (183 lb 10.3 oz) IBW/kg (Calculated) : 73  Vital Signs: Temp: 98.2 F (36.8 C) (02/05 1500) Temp Source: Bladder (02/05 1600) BP: 97/63 (02/05 1800) Pulse Rate: 77 (02/05 1800)  Labs: Recent Labs    05/24/2021 2300 05/22/21 0300 05/22/21 0303 05/22/21 1122 05/22/21 1125 05/23/21 0320 05/23/21 0324 05/23/21 1605 05/23/21 1607 05/24/21 0329 05/24/21 0330 05/24/21 1613 05/24/21 1619 05/24/21 1721 05/24/21 1749  HGB 7.8* 7.7*   < >  --    < > 7.9*   < > 7.7*   < > 7.4*   < > 9.1* 9.5* 9.2* 9.2*  HCT 23.2* 24.3*   < >  --    < > 23.7*   < > 24.1*   < > 23.6*   < > 29.1* 28.0* 27.0* 27.0*  PLT 181 179  --   --    < > 175  --  187  --  165  --  186  --   --   --   APTT 62* 59*  --  60*   < > 55*  --  53*  --  55*  --  53*  --   --   --   LABPROT  --  25.1*  --   --   --  22.1*  --   --   --  22.3*  --   --   --   --   --   INR  --  2.3*  --   --   --  1.9*  --   --   --  2.0*  --   --   --   --   --   HEPARINUNFRC 0.18* 0.13*  --  0.10*  --   --   --   --   --   --   --   --   --   --   --   CREATININE  --  1.69*  --   --    < > 1.30*  --  1.34*  --  1.34*  --  1.25*  --   --   --    < > = values in this interval not displayed.    Estimated Creatinine Clearance: 56 mL/min (A) (by C-G formula based on SCr of 1.25 mg/dL (H)).   Medical History: Past Medical History:  Diagnosis Date   Allergic rhinitis    Prostate cancer ([place])     Medications:  Infusions:   sodium chloride 10 mL/hr at 05/24/21 1800    albumin human     amiodarone 30 mg/hr (05/24/21 1800)   bivalirudin (ANGIOMAX) infusion 0.5 mg/mL (Non-ACS indications) 0.03 mg/kg/hr (05/24/21 1800)   dexmedetomidine (PRECEDEX) IV infusion 0.5 mcg/kg/hr (05/24/21 1800)   feeding supplement (VITAL 1.5 CAL) 1,000 mL (05/24/21 1356)   fentaNYL infusion INTRAVENOUS 150 mcg/hr (05/24/21 1800)   meropenem (MERREM) IV 1 g (05/24/21 1818)   norepinephrine (LEVOPHED) Adult infusion 3 mcg/min (05/24/21 1800)   vancomycin Stopped (  05/24/21 1400)   vasopressin Stopped (05/24/21 5436)    Assessment: 71 year old male with coronary artery disease who initially presented with chest pain, noted to have multivessel coronary artery disease, he underwent CABG x3 on 04/1818 23, course was complicated with atrial fibrillation, frequent hiccups and aspiration pneumonia leading to hypoxia and increasing oxygen requirement. Patient now requiring VV ECMO. Pharmacy consulted for bivalirudin IV.   Aptt therapeutic at 53 sec (on bival 0.03 mg/kg/hr). Hemoglobin low and & Platelets stable.  No issues with infusion or bleeding per RN.  Goal of Therapy:  aPTT 50-80 seconds Monitor platelets by anticoagulation protocol: Yes   Plan:  Continue bivalirudin at 0.03 mg/kg/hr (5.1 ml/hr) Aptt and CBC q12 hour checks   Thank you for allowing pharmacy to be a part of this patients care.  Benetta Spar, PharmD, BCPS, BCCP Clinical Pharmacist  Please check AMION for all [place] phone numbers After 10:00 PM, call [place] [phone]

## 2021-05-24 NOTE — Progress Notes (Addendum)
NAME:  Randall Hodges, MRN:  [id number], DOB:  06-07-1950, LOS: 68 ADMISSION DATE:  05/11/2021, CONSULTATION DATE: 05/11/2021 REFERRING MD:  Dahlia Byes, MD, CHIEF COMPLAINT: Increasing shortness of breath  History of Present Illness:  71 year old male with coronary artery disease who initially presented with chest pain, noted to have multivessel coronary artery disease, he underwent CABG x3 on 04/1818 23, course was complicated with atrial fibrillation, frequent hiccups and aspiration pneumonia leading to hypoxia and increasing oxygen requirement. PCCM was consulted for evaluation and help with management  Patient stated hiccups are better but continued complain of shortness of breath, cough unable to bring up phlegm.  Denies chest pain or palpitation During my evaluation patient is on high flow nasal cannula oxygen at 12 L and he is requiring low-dose Levophed to maintain MAP above 65.  Of note patient had echocardiogram repeated today, consistent with constrictive picture.  Pertinent  Medical History   Past Medical History:  Diagnosis Date   Allergic rhinitis    Prostate cancer ([place])     Significant Hospital Events: Including procedures, antibiotic start and stop dates in addition to other pertinent events   1/18 CABG 1/30 PCCM consult, Worsening O2 needs, likely aspiration, new pressor need 2/2 intubation, placed on VV ECMO  Interim History / Subjective:  Worsened vent mechanics this AM. PEEP and sweep had to be titrated up.  Objective   Blood pressure (!) 99/57, pulse 83, temperature 98.1 F (36.7 C), temperature source Bladder, resp. rate (!) 9, height _0  (1.778 m), weight 83.3 kg, SpO2 98 %. CVP:  [5 mmHg-9 mmHg] 8 mmHg  Vent Mode: PRVC FiO2 (%):  [40 %-50 %] 50 % Set Rate:  [15 bmp] 15 bmp Vt Set:  [290 mL] 290 mL PEEP:  [5 cmH20-10 cmH20] 10 cmH20 Pressure Support:  [12 cmH20] 12 cmH20 Plateau Pressure:  [15 cmH20] 15 cmH20   Intake/Output Summary (Last 24  hours) at 05/24/2021 0929 Last data filed at 05/24/2021 0900 Gross per 24 hour  Intake 3869.73 ml  Output 4775 ml  Net -905.27 ml    Filed Weights   05/22/21 0300 05/23/21 0411 05/24/21 0440  Weight: 80.8 kg 84.6 kg 83.3 kg    Examination: No distress on vent Lungs with rhonci bilaterally Median sternotomy scar CDI Circuit looks good Continued anasarca  Current drips: fent, amio, precedex, bival CVP 8  Lab review Improved liver and kidney LDH 1400>>615>>380>>428 CBG ok WBC stable PTT 55 INR 2 Fibrinogen stable 589 Hgb down slightly  Plts okay [place] 62%  CXR stable ARDS  Resolved Hospital Problem list   Hiccups postop  Assessment & Plan:  CAD with unstable angina s/p CABGx3 1/18 (LIMA-LAD, SVG-OM, SVG-left PDA) Postop recurrent shock question constrictive pericaritis type physiology vs. Septic Presumed aspiration pneumonitis vs. HCAP vs. ?AEILD culminating in severe ARDS with inability to ventilate along with high Aa gradient s/p VV ECMO cannulation 05/21/22.  CXR deteriorated between 1/26 and 1/29.   Need for [facility] for ECMO Atypical aflutter on amio Baseline fibrotic lung disease with preserved PFTs 2019, question of sarcoid- lost to f/u, see Dr. Matilde Bash note 08/15/17 AKI- multifactorial (sepsis, poor cardiac outflow, shock) improved Bilateral effusions- post CABG, R>L on bedside US (2/3), do not think would help vent wean at this time to drain  - Diuresis, flow, and sweep wean per CHF team - Usual hemolysis panel monitoring - Bival with PharmD aPTT monitoring and adjustment - Trial of PRVC with 4cc/kg ideal body weight tidal volumes (  290cc) - Fent/Precedex titrated to RASS -1 - Continue vanc/meropenem, BAL cx neg, check MRSA PCR, if neg would do meropenem monotherapy x 7 days - Continue steroids for now - PT/OT, TF as ordered  Best Practice (right click and "Reselect all SmartList Selections" daily)   Diet/type: Vital @ 69m/hr DVT prophylaxis: Bival CBG: yes,  SSI GI prophylaxis: PPI Lines: L [place] TLC, RIJ  Foley:  Yes, keep Code Status:  full code Last date of multidisciplinary goals of care discussion [2/2 by Dr. CTacy Learn 35 min cc time DErskine EmeryMD PCCM

## 2021-05-24 NOTE — Anesthesia Postprocedure Evaluation (Signed)
Anesthesia Post Note  Patient: Randall Hodges  Procedure(s) Performed: ECMO CANNULATION (Bilateral)     Patient location during evaluation: SICU Anesthesia Type: General Level of consciousness: sedated Pain management: pain level controlled Vital Signs Assessment: post-procedure vital signs reviewed and stable Respiratory status: patient remains intubated per anesthesia plan Cardiovascular status: stable Postop Assessment: no apparent nausea or vomiting Anesthetic complications: no   There were no known notable events for this encounter.  Last Vitals:  Vitals:   05/24/21 0800 05/24/21 0900  BP: (!) 99/57 104/60  Pulse: 83 83  Resp: (!) 9 (!) 9  Temp: 36.7 C 36.7 C  SpO2: 98% 100%    Last Pain:  Vitals:   05/24/21 0800  TempSrc: Bladder  PainSc:                  Labaron Digirolamo

## 2021-05-24 NOTE — Progress Notes (Signed)
Patient ID: Randall Hodges, male   DOB: 1950/12/29, 71 y.o.   MRN: [id number]  Extracorporeal support note   ECLS support day: Indication: ARDS  Configuration: VV  Drainage cannula: [place] R IJ Return cannula: [place]cent R IJ  Pump speed: 3100 rpm Pump flow: 4.08 L/min Pump used: Cardiohelp  Sweep gas: 5  Circuit check: No thrombus. Good color change. LDH stable.  Anticoagulant: Bivalirudin  Changes in support: Continue current support. Diurese. Vent adjusted per CCM.   Anticipated goals/duration of support: Wean to discontinuation  Loralie Champagne, MD  7:32 AM

## 2021-05-24 NOTE — Progress Notes (Signed)
ANTICOAGULATION CONSULT NOTE -  Pharmacy Consult for Bivalirudin Indication:  ECMO  Allergies  Allergen Reactions   Amoxicillin-Pot Clavulanate     Per pt report on 08/19/20, makes his urine dark colored   Atorvastatin Other (See Comments)     ( pt states causing runny nose, headaches, issue w/ urination)   Plant Derived Enzymes     Other reaction(s): Unknown   Trichophyton Other (See Comments)    Patient Measurements: Height: 5\' 10"  (177.8 cm) Weight: 83.3 kg (183 lb 10.3 oz) IBW/kg (Calculated) : 73  Vital Signs: Temp: 98.1 F (36.7 C) (02/05 0700) Temp Source: Bladder (02/05 0400) BP: 104/50 (02/05 0700) Pulse Rate: 88 (02/05 0700)  Labs: Recent Labs    06/13/2021 2300 05/22/21 0300 05/22/21 0303 05/22/21 1122 05/22/21 1125 05/23/21 0320 05/23/21 0324 05/23/21 1605 05/23/21 1607 05/23/21 2340 05/24/21 0329 05/24/21 0330  HGB 7.8* 7.7*   < >  --    < > 7.9*   < > 7.7*   < > 7.5* 7.4* 7.5*  HCT 23.2* 24.3*   < >  --    < > 23.7*   < > 24.1*   < > 22.0* 23.6* 22.0*  PLT 181 179  --   --    < > 175  --  187  --   --  165  --   APTT 62* 59*  --  60*   < > 55*  --  53*  --   --  55*  --   LABPROT  --  25.1*  --   --   --  22.1*  --   --   --   --  22.3*  --   INR  --  2.3*  --   --   --  1.9*  --   --   --   --  2.0*  --   HEPARINUNFRC 0.18* 0.13*  --  0.10*  --   --   --   --   --   --   --   --   CREATININE  --  1.69*  --   --    < > 1.30*  --  1.34*  --   --  1.34*  --    < > = values in this interval not displayed.     Estimated Creatinine Clearance: 52.2 mL/min (A) (by C-G formula based on SCr of 1.34 mg/dL (H)).   Medical History: Past Medical History:  Diagnosis Date   Allergic rhinitis    Prostate cancer ([place])     Medications:  Infusions:   sodium chloride 10 mL/hr at 05/24/21 0700   albumin human     amiodarone 30 mg/hr (05/24/21 0700)   bivalirudin (ANGIOMAX) infusion 0.5 mg/mL (Non-ACS indications) 0.03 mg/kg/hr (05/24/21 0700)    dexmedetomidine (PRECEDEX) IV infusion 0.5 mcg/kg/hr (05/24/21 0700)   feeding supplement (VITAL 1.5 CAL) 1,000 mL (05/23/21 0537)   fentaNYL infusion INTRAVENOUS 100 mcg/hr (05/24/21 0700)   meropenem (MERREM) IV Stopped (05/24/21 0244)   norepinephrine (LEVOPHED) Adult infusion Stopped (05/24/21 0622)   vancomycin Stopped (05/23/21 2311)   vasopressin 0.02 Units/min (05/24/21 0700)    Assessment: 71 year old male with coronary artery disease who initially presented with chest pain, noted to have multivessel coronary artery disease, he underwent CABG x3 on 04/1818 23, course was complicated with atrial fibrillation, frequent hiccups and aspiration pneumonia leading to hypoxia and increasing oxygen requirement. Patient now requiring VV ECMO. Pharmacy consulted for bivalirudin IV.  Aptt therapeutic at 55 sec (on bival 0.03 mg/kg/hr). Hemoglobin low and stable, Platelets stable.  LDH 428 today, slight increase from 380 yesterday. Scr stable at 1.34. No issues with infusion or bleeding per RN Misha.  Goal of Therapy:  aPTT 50-80 seconds Monitor platelets by anticoagulation protocol: Yes   Plan:  Continue bivalirudin at 0.03 mg/kg/hr (5.1 ml/hr) Aptt and CBC q12 hour checks  Thank you for allowing pharmacy to be a part of this patients care.  Cathrine Muster, PharmD PGY2 Cardiology Pharmacy Resident Phone: [phone] 05/24/2021  7:16 AM  Please check [email or web address] for unit-specific pharmacy phone numbers.

## 2021-05-24 NOTE — Procedures (Signed)
Bronchoscopy Procedure Note  Randall Hodges  [id number]  [birth year]  Date:05/24/21  Time:5:12 PM   Provider Performing:Brent Adriyana Greenbaum   Procedure(s):  Flexible Bronchoscopy [phone])  Indication(s) Worsening oxygenation  Consent Risks of the procedure as well as the alternatives and risks of each were explained to the patient and/or caregiver.  Consent for the procedure was obtained and is signed in the bedside chart  Anesthesia ICU sedation: precedex; fentanyl/versed   Time Out Verified patient identification, verified procedure, site/side was marked, verified correct patient position, special equipment/implants available, medications/allergies/relevant history reviewed, required imaging and test results available.   Sterile Technique Usual hand hygiene, masks, gowns, and gloves were used   Procedure Description Bronchoscope advanced through endotracheal tube and into airway.  Airways were examined down to subsegmental level with findings noted below.   Following diagnostic evaluation, none  Findings: trachea was patent, carina was sharp, the bilateral tracheal bronchial treat was normal in appearance without redness, edema or secretions.   Complications/Tolerance None; patient tolerated the procedure well. Chest X-ray is not needed post procedure.   EBL Minimal   Specimen(s) None  Roselie Awkward, MD [place] PCCM Pager: [phone] Cell: [phone] After 7:00 pm call Elink  [phone]

## 2021-05-24 NOTE — Progress Notes (Addendum)
Patient ID: SHADEN LACHER, male   DOB: Aug 28, 1950, 71 y.o.   MRN: [id number]    Progress Note  Patient Name: Randall Hodges Date of Encounter: 05/24/2021  [facility] Cardiologist: [name], MD   Subjective   2/2: VV ECMO initiation, Crescent catheter right IJ  Remains on vent. On precedex for sedation. Will wake up and follow commands. CVP 9 with mildly negative I/Os. Received lasix 80 IV x 2 yesterday. Co-ox 62%  He is in NSR on amiodarone gtt, had transient atrial fibrillation overnight.  Bivalirudin gtt for ECMO circuit. No bleeding  LDH 615 -> 380 -> 428  Has CorTrak tube feeds.   Vent FiO2 0.5. CXR still with bilateral diffuse airspace disease. PEEP decreased 10=>5 with drop in PaO2.   ECMO: Speed 3100 rpm Flow 4.08 L/min pVen -65 DeltaP 18 PTT 55 LDH 615 -> 380 -> 428 Lactate 1.7 -> 1.2 -> 1.3 ABG 7.40/5766/92%  Cardiac Studies: Echo (limited, 1/30): Echo reviewed, there is clear respirophasic variation of the interventricular septum and marked respirophasic variation of E inflow velocity on doppler evaluation of the mitral valve.  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  LV EF 45-50%.   RHC Procedural Findings (on norepinephrine 6): Hemodynamics (mmHg) RA mean 12 RV 37/12 PA 38/16, mean 27 PCWP mean 11 LV 108/12 AO 96/55 PAPI 1.8 Oxygen saturations: PA 54% AO 94% Cardiac Output (Fick) 5.59  Cardiac Index (Fick) 2.81  PVR 2.8 WU Simultaneous RV/LV tracings were obtained.  Difficult to interpret due to atrial fibrillation.  There was some suggestion of discordance (ventricular interdependence) but not clear.  Inpatient Medications    Scheduled Meds:  sodium chloride   Intravenous Once   aspirin  81 mg Per Tube Daily   chlorhexidine gluconate (MEDLINE KIT)   15 mL Mouth Rinse BID   Chlorhexidine Gluconate Cloth  6 each Topical Daily   colchicine  0.6 mg Per Tube Daily   docusate  100 mg Per Tube BID   feeding supplement (PROSource TF)  45 mL Per Tube TID   furosemide  80 mg Intravenous BID   guaiFENesin  30 mL Per Tube QID   insulin aspart  0-15 Units Subcutaneous Q4H   mouth rinse  15 mL Mouth Rinse 10 times per day   methylPREDNISolone (SOLU-MEDROL) injection  40 mg Intravenous Q12H   metoCLOPramide (REGLAN) injection  10 mg Intravenous Q6H   midodrine  5 mg Oral TID WC   pantoprazole (PROTONIX) IV  40 mg Intravenous QHS   polyethylene glycol  17 g Per Tube Daily   rosuvastatin  10 mg Per Tube Daily   sodium chloride flush  10-40 mL Intracatheter Q12H   sodium chloride flush  3 mL Intravenous Q12H   Continuous Infusions:  sodium chloride 10 mL/hr at 05/24/21 0700   albumin human     amiodarone 30 mg/hr (05/24/21 0700)   bivalirudin (ANGIOMAX) infusion 0.5 mg/mL (Non-ACS indications) 0.03 mg/kg/hr (05/24/21 0700)   dexmedetomidine (PRECEDEX) IV infusion 0.5 mcg/kg/hr (05/24/21 0700)   feeding supplement (VITAL 1.5 CAL) 1,000 mL (05/23/21 0537)   fentaNYL infusion INTRAVENOUS 100 mcg/hr (05/24/21 0700)   meropenem (MERREM) IV Stopped (05/24/21 0244)   norepinephrine (LEVOPHED) Adult infusion Stopped (05/24/21 0622)   potassium chloride     vancomycin Stopped (05/23/21 2311)   vasopressin 0.02 Units/min (05/24/21 0700)   PRN Meds:  Place/Maintain arterial line **AND** sodium chloride, acetaminophen (TYLENOL) oral liquid 160 mg/5 mL, albumin human, diazepam, fentaNYL (SUBLIMAZE) injection, fentaNYL (SUBLIMAZE) injection, hydrOXYzine, levalbuterol, ondansetron (ZOFRAN) IV, sodium chloride flush   Vital Signs    Vitals:   05/24/21 0615 05/24/21 0630 05/24/21 0645 05/24/21 0700  BP:    (!) 104/50  Pulse: 77 74 78 88  Resp: _0 Temp: 98.1 F (36.7 C) 98.1 F (36.7 C) 98.1 F (36.7 C) 98.1 F (36.7 C)  TempSrc:      SpO2:  90% 92% 93% 97%  Weight:      Height:    5' 10" (1.778 m)    Intake/Output Summary (Last 24 hours) at 05/24/2021 0734 Last data filed at 05/24/2021 0700 Gross per 24 hour  Intake 4115.37 ml  Output 4850 ml  Net -734.63 ml   Last 3 Weights 05/24/2021 05/23/2021 05/22/2021  Weight (lbs) 183 lb 10.3 oz 186 lb 8.2 oz 178 lb 2.1 oz  Weight (kg) 83.3 kg 84.6 kg 80.8 kg      Telemetry    NSR 80s with PVCs, run of AF last night (personally reviewed)  Physical Exam   General: Vent, RIJ Crescent cannula Neck: JVP 8-9, no thyromegaly or thyroid nodule.  Lungs: Bilateral crackles CV: Nondisplaced PMI.  Heart regular S1/S2, no S3/S4, no murmur.  Trace ankle edema.  Abdomen: Soft, no hepatosplenomegaly, no distention.  Skin: Intact without lesions or rashes.  Neurologic: Sedated, per nursing he will awaken and follow commands.  Extremities: No clubbing or cyanosis.  HEENT: Normal.   Labs    High Sensitivity Troponin:   Recent Labs  Lab 05/19/2021 1321 05/09/2021 1518  TROPONINIHS 3 4     Chemistry Recent Labs  Lab 05/20/21 0746 05/20/21 1011 05/22/21 0300 05/22/21 0303 05/22/21 2100 05/23/21 0320 05/23/21 0324 05/23/21 1605 05/23/21 1607 05/23/21 2340 05/24/21 0329 05/24/21 0330  NA  --    < > 139   < >  --  139   < > 138   < > 141 142 142  K  --    < > 4.2   < >  --  4.4   < > 4.1   < > 4.0 4.2 3.9  CL  --    < > 98   < >  --  99  --  101  --   --  100  --   CO2  --    < > 32   < >  --  31  --  30  --   --  33*  --   GLUCOSE  --    < > 143*   < >  --  200*  --  171*  --   --  195*  --   BUN  --    < > 41*   < >  --  53*  --  58*  --   --  61*  --   CREATININE  --    < > 1.69*   < >  --  1.30*  --  1.34*  --   --  1.34*  --   CALCIUM  --    < > 8.0*   < >  --  7.9*  --  7.8*  --   --  7.7*  --   MG 1.9  --   --   --  1.8  --   --   --   --   --  2.2  --   PROT  --    < > 4.4*  --   --  4.5*  --   --   --   --  4.3*  --   ALBUMIN  --    < > 1.9*  --   --  1.9*  --   --   --   --   2.0*  --   AST  --    < > 1,035*  --   --  327*  --   --   --   --  134*  --   ALT  --    < > 479*  --   --  350*  --   --   --   --  255*  --   ALKPHOS  --    < > 150*  --   --  158*  --   --   --   --  121  --   BILITOT  --    < > 1.2  --   --  1.0  --   --   --   --  1.1  --   GFRNONAA  --    < > 43*   < >  --  59*  --  57*  --   --  57*  --   ANIONGAP  --    < > 9   < >  --  9  --  7  --   --  9  --    < > = values in this interval not displayed.    Lipids  No results for input(s): CHOL, TRIG, HDL, LABVLDL, LDLCALC, CHOLHDL in the last 168 hours.   Hematology Recent Labs  Lab 05/23/21 0320 05/23/21 0324 05/23/21 1605 05/23/21 1607 05/23/21 2340 05/24/21 0329 05/24/21 0330  WBC 12.1*  --  11.3*  --   --  9.4  --   RBC 2.63*  --  2.65*  --   --  2.54*  --   HGB 7.9*   < > 7.7*   < > 7.5* 7.4* 7.5*  HCT 23.7*   < > 24.1*   < > 22.0* 23.6* 22.0*  MCV 90.1  --  90.9  --   --  92.9  --   MCH 30.0  --  29.1  --   --  29.1  --   MCHC 33.3  --  32.0  --   --  31.4  --   RDW 15.3  --  15.4  --   --  15.4  --   PLT 175  --  187  --   --  165  --    < > = values in this interval not displayed.   Thyroid No results for input(s): TSH, FREET4 in the last 168 hours.  BNPNo results for input(s): BNP, PROBNP in the last 168 hours.  DDimer No results for input(s): DDIMER in the last 168 hours.   Radiology    DG Chest Port 1 View  Result Date: 05/23/2021 CLINICAL DATA:  Chest pain, shortness of breath EXAM: PORTABLE CHEST 1 VIEW COMPARISON:  Previous studies including the examination of 05/22/2021 FINDINGS: Transverse diameter of heart is increased. Tip of endotracheal tube is 6.7 cm above the carina. NG tube is noted traversing the esophagus. Tip of central venous catheter is seen in the superior vena cava. There is a large caliber catheter following the course of  right IJ superior vena cava, right atrium and inferior vena cava. Extensive patchy interstitial and alveolar densities seen in  both lungs. There is interval worsening of infiltrates in the right lower lung fields. There is slight decrease in alveolar densities in both upper lung fields. There is blunting of both lateral CP angles. There is no pneumothorax. IMPRESSION: There is interval worsening of infiltrates in the right lower lung fields. Diffuse extensive increased markings are seen throughout both lungs suggesting pulmonary edema and possibly underlying pneumonia. Small bilateral pleural effusions. Electronically Signed   By: Elmer Picker M.D.   On: 05/23/2021 08:15    Cardiac Studies   Cath 05/04/2021 Distal left main [place] 111 bifurcation stenosis with 75% left main, 90% ostial to proximal LAD, and 80-90% ostial circumflex (difficult to assess due to heavy calcification). Severe mid circumflex disease with 70% eccentric mid stenosis and second obtuse marginal containing ostial to proximal greater than 80% stenosis.  (Bifurcation [place] 111 Severe calcification in left main and LAD in particular with diffuse 50% narrowing from proximal to mid vessel and tandem 70% stenoses in the mid LAD. Nondominant right coronary Normal LV function.  EF 55%.  LVEDP normal.    Patient Profile     71 y.o. male with PMH of PVCs presented with chest pain. Cardiac cath by Dr. Tamala Julian on 05/19/2021 showed 75% left main, 90% ost to prox LAD, 80-90% ost LCx, 70% mid LCx, 80% OM2, 50% prox to mid LAD, 70% mid LAD, EF 55%. Patient underwent CABG x 3 on 04/25/2021. Post op course complicated afib, treated with amio. CXR showed opacity in bilateral lung, started abx on 1/25 and diuretic. Started on Eliquis due to recurrence of afib.    Assessment & Plan    1. CAD: Admitted with unstable angina, cath with severe left main and proximal LAD/LCx disease (nondominant RCA).  CABG x 3 on 1/18 with LIMA-LAD, SVG-OM, SVG-left PDA.  No s/s angina  - Continue ASA 81, statin.  2. 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 RHC 1/30 with equalization of diastolic pressures.  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  Repeated echo 2/2 still showed respirophasic septal variation but not as impressive. CVP 9 today with I/Os slightly negative, co-ox 62%. Weight down 3 lbs.  - Lasix 80 mg IV bid again today, keep mildly negative.  - With concern for development of post-surgical effusive/constrictive pericarditis, he is on colchicine.  - ?need for surgical intervention on effusive/constrictive pericarditis. Will need improvement of lung disease first then reassess, repeat echo not as impressive.   3. 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  Probably primarily septic/vasodilatory shock. He is currently off NE and still on vasopressin 0.02 with stable MAP. Lactate normal.  - Add midodrine 5 mg tid.  - Wean vasopressin off if possible.  4. Hiccups: Had been intractable, now seem resolved.  5. PNA: Of note, he does appear to have had some pre-existing ILD from 2019 CT chest (?sarcoidosis). CXR with persistent bilateral infiltrates, possible mild improvement compared to yesterday.  Have thought most likely aspiration PNA/pneumonitis in setting of intractable hiccups. He has developred ARDS.  Afebrile. COVID was negative.  ABG worse with decreased PEEP, FiO2 0.5.  - Discussed with pulmonary, think amiodarone toxicity unlikely as he has only been on it post-op.  - Continue meropenem/vancomycin.  - Vent per CCM, PEEP increased back to 10.  - Solumedrol 40 mg IV bid.  6. Atypical atrial  flutter/PVCs:  remains in NSR - Continue amiodarone 30 mg/hr. - Bivalirudin gtt.  Discussed dosing with PharmD personally. 7. AKI: Creatinine 1.87 => 1.69 => 1.3 => 1.34.  Follow closely.  8. Elevated LFTs: Suspect shock liver, follow CMET.  LFTs trending down.  9. Anemia: Hgb 7.7.  - Will give 1 unit PRBCs.   CRITICAL CARE Performed by: Loralie Champagne  Total critical care time: 40 minutes  Critical care time was exclusive of separately billable procedures and treating other patients.  Critical care was necessary to treat or prevent imminent or life-threatening deterioration.  Critical care was time spent personally by me on the following activities: development of treatment plan with patient and/or surrogate as well as nursing, discussions with consultants, evaluation of patient's response to treatment, examination of patient, obtaining history from patient or surrogate, ordering and performing treatments and interventions, ordering and review of laboratory studies, ordering and review of radiographic studies, pulse oximetry and re-evaluation of patient's condition.  Loralie Champagne MD 05/24/2021 7:34 AM

## 2021-05-25 ENCOUNTER — Inpatient Hospital Stay (HOSPITAL_COMMUNITY): Payer: PPO

## 2021-05-25 DIAGNOSIS — J9601 Acute respiratory failure with hypoxia: Secondary | ICD-10-CM | POA: Diagnosis not present

## 2021-05-25 DIAGNOSIS — I249 Acute ischemic heart disease, unspecified: Secondary | ICD-10-CM | POA: Diagnosis not present

## 2021-05-25 LAB — BASIC METABOLIC PANEL
Anion gap: 7 (ref 5–15)
Anion gap: 9 (ref 5–15)
BUN: 58 mg/dL — ABNORMAL HIGH (ref 8–23)
BUN: 59 mg/dL — ABNORMAL HIGH (ref 8–23)
CO2: 35 mmol/L — ABNORMAL HIGH (ref 22–32)
CO2: 35 mmol/L — ABNORMAL HIGH (ref 22–32)
Calcium: 7.8 mg/dL — ABNORMAL LOW (ref 8.9–10.3)
Calcium: 7.9 mg/dL — ABNORMAL LOW (ref 8.9–10.3)
Chloride: 105 mmol/L (ref 98–111)
Chloride: 107 mmol/L (ref 98–111)
Creatinine, Ser: 1.17 mg/dL (ref 0.61–1.24)
Creatinine, Ser: 1.17 mg/dL (ref 0.61–1.24)
GFR, Estimated: >60 mL/min (ref >60)
GFR, Estimated: >60 mL/min (ref >60)
Glucose, Bld: 173 mg/dL — ABNORMAL HIGH (ref 70–99)
Glucose, Bld: 177 mg/dL — ABNORMAL HIGH (ref 70–99)
Potassium: 4.1 mmol/L (ref 3.5–5.1)
Potassium: 4.1 mmol/L (ref 3.5–5.1)
Sodium: 147 mmol/L — ABNORMAL HIGH (ref 135–145)
Sodium: 151 mmol/L — ABNORMAL HIGH (ref 135–145)

## 2021-05-25 LAB — TYPE AND SCREEN
ABO/RH(D): O POS
Antibody Screen: NEGATIVE
Unit division: 0
Unit division: 0
Unit division: 0
Unit division: 0
Unit division: 0
Unit division: 0
Unit division: 0
Unit division: 0

## 2021-05-25 LAB — PROTIME-INR
INR: 2 — ABNORMAL HIGH (ref 0.8–1.2)
Prothrombin Time: 22.3 seconds — ABNORMAL HIGH (ref 11.4–15.2)

## 2021-05-25 LAB — GLUCOSE, CAPILLARY
Glucose-Capillary: 149 mg/dL — ABNORMAL HIGH (ref 70–99)
Glucose-Capillary: 144 mg/dL — ABNORMAL HIGH (ref 70–99)
Glucose-Capillary: 153 mg/dL — ABNORMAL HIGH (ref 70–99)
Glucose-Capillary: 164 mg/dL — ABNORMAL HIGH (ref 70–99)
Glucose-Capillary: 140 mg/dL — ABNORMAL HIGH (ref 70–99)
Glucose-Capillary: 154 mg/dL — ABNORMAL HIGH (ref 70–99)

## 2021-05-25 LAB — POCT I-STAT 7, (LYTES, BLD GAS, ICA,H+H)
Acid-Base Excess: 12 mmol/L — ABNORMAL HIGH (ref 0.0–2.0)
Acid-Base Excess: 11 mmol/L — ABNORMAL HIGH (ref 0.0–2.0)
Acid-Base Excess: 9 mmol/L — ABNORMAL HIGH (ref 0.0–2.0)
Acid-Base Excess: 12 mmol/L — ABNORMAL HIGH (ref 0.0–2.0)
Acid-Base Excess: 10 mmol/L — ABNORMAL HIGH (ref 0.0–2.0)
Acid-Base Excess: 12 mmol/L — ABNORMAL HIGH (ref 0.0–2.0)
Acid-Base Excess: 11 mmol/L — ABNORMAL HIGH (ref 0.0–2.0)
Bicarbonate: 37.5 mmol/L — ABNORMAL HIGH (ref 20.0–28.0)
Bicarbonate: 36.9 mmol/L — ABNORMAL HIGH (ref 20.0–28.0)
Bicarbonate: 35.8 mmol/L — ABNORMAL HIGH (ref 20.0–28.0)
Bicarbonate: 38.6 mmol/L — ABNORMAL HIGH (ref 20.0–28.0)
Bicarbonate: 36.4 mmol/L — ABNORMAL HIGH (ref 20.0–28.0)
Bicarbonate: 38.7 mmol/L — ABNORMAL HIGH (ref 20.0–28.0)
Bicarbonate: 37.2 mmol/L — ABNORMAL HIGH (ref 20.0–28.0)
Calcium, Ion: 1.14 mmol/L — ABNORMAL LOW (ref 1.15–1.40)
Calcium, Ion: 1.16 mmol/L (ref 1.15–1.40)
Calcium, Ion: 1.17 mmol/L (ref 1.15–1.40)
Calcium, Ion: 1.17 mmol/L (ref 1.15–1.40)
Calcium, Ion: 1.15 mmol/L (ref 1.15–1.40)
Calcium, Ion: 1.2 mmol/L (ref 1.15–1.40)
Calcium, Ion: 1.18 mmol/L (ref 1.15–1.40)
HCT: 27 % — ABNORMAL LOW (ref 39.0–52.0)
HCT: 28 % — ABNORMAL LOW (ref 39.0–52.0)
HCT: 34 % — ABNORMAL LOW (ref 39.0–52.0)
HCT: 30 % — ABNORMAL LOW (ref 39.0–52.0)
HCT: 29 % — ABNORMAL LOW (ref 39.0–52.0)
HCT: 27 % — ABNORMAL LOW (ref 39.0–52.0)
HCT: 28 % — ABNORMAL LOW (ref 39.0–52.0)
Hemoglobin: 9.2 g/dL — ABNORMAL LOW (ref 13.0–17.0)
Hemoglobin: 9.5 g/dL — ABNORMAL LOW (ref 13.0–17.0)
Hemoglobin: 11.6 g/dL — ABNORMAL LOW (ref 13.0–17.0)
Hemoglobin: 10.2 g/dL — ABNORMAL LOW (ref 13.0–17.0)
Hemoglobin: 9.9 g/dL — ABNORMAL LOW (ref 13.0–17.0)
Hemoglobin: 9.2 g/dL — ABNORMAL LOW (ref 13.0–17.0)
Hemoglobin: 9.5 g/dL — ABNORMAL LOW (ref 13.0–17.0)
O2 Saturation: 98 %
O2 Saturation: 100 %
O2 Saturation: 98 %
O2 Saturation: 99 %
O2 Saturation: 99 %
O2 Saturation: 99 %
O2 Saturation: 99 %
Patient temperature: 36.8
Patient temperature: 36.8
Patient temperature: 36.8
Patient temperature: 36.8
Patient temperature: 36.9
Patient temperature: 36.9
Potassium: 3.9 mmol/L (ref 3.5–5.1)
Potassium: 4 mmol/L (ref 3.5–5.1)
Potassium: 3.9 mmol/L (ref 3.5–5.1)
Potassium: 4.1 mmol/L (ref 3.5–5.1)
Potassium: 4 mmol/L (ref 3.5–5.1)
Potassium: 4.1 mmol/L (ref 3.5–5.1)
Potassium: 4.2 mmol/L (ref 3.5–5.1)
Sodium: 147 mmol/L — ABNORMAL HIGH (ref 135–145)
Sodium: 150 mmol/L — ABNORMAL HIGH (ref 135–145)
Sodium: 148 mmol/L — ABNORMAL HIGH (ref 135–145)
Sodium: 150 mmol/L — ABNORMAL HIGH (ref 135–145)
Sodium: 150 mmol/L — ABNORMAL HIGH (ref 135–145)
Sodium: 150 mmol/L — ABNORMAL HIGH (ref 135–145)
Sodium: 150 mmol/L — ABNORMAL HIGH (ref 135–145)
TCO2: 39 mmol/L — ABNORMAL HIGH (ref 22–32)
TCO2: 39 mmol/L — ABNORMAL HIGH (ref 22–32)
TCO2: 38 mmol/L — ABNORMAL HIGH (ref 22–32)
TCO2: 40 mmol/L — ABNORMAL HIGH (ref 22–32)
TCO2: 38 mmol/L — ABNORMAL HIGH (ref 22–32)
TCO2: 41 mmol/L — ABNORMAL HIGH (ref 22–32)
TCO2: 39 mmol/L — ABNORMAL HIGH (ref 22–32)
pCO2 arterial: 53.5 mmHg — ABNORMAL HIGH (ref 32.0–48.0)
pCO2 arterial: 56.8 mmHg — ABNORMAL HIGH (ref 32.0–48.0)
pCO2 arterial: 56.9 mmHg — ABNORMAL HIGH (ref 32.0–48.0)
pCO2 arterial: 60.2 mmHg — ABNORMAL HIGH (ref 32.0–48.0)
pCO2 arterial: 60.7 mmHg — ABNORMAL HIGH (ref 32.0–48.0)
pCO2 arterial: 61.6 mmHg — ABNORMAL HIGH (ref 32.0–48.0)
pCO2 arterial: 56.5 mmHg — ABNORMAL HIGH (ref 32.0–48.0)
pH, Arterial: 7.452 — ABNORMAL HIGH (ref 7.350–7.450)
pH, Arterial: 7.421 (ref 7.350–7.450)
pH, Arterial: 7.407 (ref 7.350–7.450)
pH, Arterial: 7.414 (ref 7.350–7.450)
pH, Arterial: 7.385 (ref 7.350–7.450)
pH, Arterial: 7.405 (ref 7.350–7.450)
pH, Arterial: 7.426 (ref 7.350–7.450)
pO2, Arterial: 107 mmHg (ref 83.0–108.0)
pO2, Arterial: 191 mmHg — ABNORMAL HIGH (ref 83.0–108.0)
pO2, Arterial: 112 mmHg — ABNORMAL HIGH (ref 83.0–108.0)
pO2, Arterial: 119 mmHg — ABNORMAL HIGH (ref 83.0–108.0)
pO2, Arterial: 147 mmHg — ABNORMAL HIGH (ref 83.0–108.0)
pO2, Arterial: 172 mmHg — ABNORMAL HIGH (ref 83.0–108.0)
pO2, Arterial: 120 mmHg — ABNORMAL HIGH (ref 83.0–108.0)

## 2021-05-25 LAB — BPAM RBC
Blood Product Expiration Date: 202303032359
Blood Product Expiration Date: 202303032359
Blood Product Expiration Date: 202303032359
Blood Product Expiration Date: 202303032359
Blood Product Expiration Date: 202303032359
Blood Product Expiration Date: 202303042359
Blood Product Expiration Date: 202303042359
Blood Product Expiration Date: 202303042359
ISSUE DATE / TIME: 202302021338
ISSUE DATE / TIME: 202302021338
ISSUE DATE / TIME: 202302030953
ISSUE DATE / TIME: 202302041238
ISSUE DATE / TIME: 202302050951
Unit Type and Rh: 5100
Unit Type and Rh: 5100
Unit Type and Rh: 5100
Unit Type and Rh: 5100
Unit Type and Rh: 5100
Unit Type and Rh: 5100
Unit Type and Rh: 5100
Unit Type and Rh: 5100

## 2021-05-25 LAB — COOXEMETRY PANEL
Carboxyhemoglobin: 1.8 % — ABNORMAL HIGH (ref 0.5–1.5)
Methemoglobin: 1 % (ref 0.0–1.5)
O2 Saturation: 72.9 %
Total hemoglobin: 9.5 g/dL — ABNORMAL LOW (ref 12.0–16.0)

## 2021-05-25 LAB — CBC
HCT: 29.7 % — ABNORMAL LOW (ref 39.0–52.0)
HCT: 30.8 % — ABNORMAL LOW (ref 39.0–52.0)
Hemoglobin: 9.6 g/dL — ABNORMAL LOW (ref 13.0–17.0)
Hemoglobin: 9.9 g/dL — ABNORMAL LOW (ref 13.0–17.0)
MCH: 28.9 pg (ref 26.0–34.0)
MCH: 29 pg (ref 26.0–34.0)
MCHC: 32.3 g/dL (ref 30.0–36.0)
MCHC: 32.1 g/dL (ref 30.0–36.0)
MCV: 89.5 fL (ref 80.0–100.0)
MCV: 90.3 fL (ref 80.0–100.0)
Platelets: 195 10*3/uL (ref 150–400)
Platelets: 202 10*3/uL (ref 150–400)
RBC: 3.32 MIL/uL — ABNORMAL LOW (ref 4.22–5.81)
RBC: 3.41 MIL/uL — ABNORMAL LOW (ref 4.22–5.81)
RDW: 16.2 % — ABNORMAL HIGH (ref 11.5–15.5)
RDW: 16.1 % — ABNORMAL HIGH (ref 11.5–15.5)
WBC: 12.9 10*3/uL — ABNORMAL HIGH (ref 4.0–10.5)
WBC: 11.8 10*3/uL — ABNORMAL HIGH (ref 4.0–10.5)
nRBC: 0.2 % (ref 0.0–0.2)
nRBC: 0 % (ref 0.0–0.2)

## 2021-05-25 LAB — HEPATIC FUNCTION PANEL
ALT: 274 U/L — ABNORMAL HIGH (ref 0–44)
AST: 165 U/L — ABNORMAL HIGH (ref 15–41)
Albumin: 2 g/dL — ABNORMAL LOW (ref 3.5–5.0)
Alkaline Phosphatase: 155 U/L — ABNORMAL HIGH (ref 38–126)
Bilirubin, Direct: 0.2 mg/dL (ref 0.0–0.2)
Indirect Bilirubin: 0.5 mg/dL (ref 0.3–0.9)
Total Bilirubin: 0.7 mg/dL (ref 0.3–1.2)
Total Protein: 4.4 g/dL — ABNORMAL LOW (ref 6.5–8.1)

## 2021-05-25 LAB — SEDIMENTATION RATE: Sed Rate: 25 mm/hr — ABNORMAL HIGH (ref 0–16)

## 2021-05-25 LAB — APTT
aPTT: 49 seconds — ABNORMAL HIGH (ref 24–36)
aPTT: 47 seconds — ABNORMAL HIGH (ref 24–36)

## 2021-05-25 LAB — PNEUMOCYSTIS JIROVECI SMEAR BY DFA

## 2021-05-25 LAB — LACTIC ACID, PLASMA: Lactic Acid, Venous: 1.3 mmol/L (ref 0.5–1.9)

## 2021-05-25 LAB — MAGNESIUM: Magnesium: 2.1 mg/dL (ref 1.7–2.4)

## 2021-05-25 LAB — FIBRINOGEN: Fibrinogen: 431 mg/dL (ref 210–475)

## 2021-05-25 LAB — LACTATE DEHYDROGENASE: LDH: 391 U/L — ABNORMAL HIGH (ref 98–192)

## 2021-05-25 MED ORDER — QUETIAPINE FUMARATE 25 MG PO TABS
25.0000 mg | ORAL_TABLET | Freq: Two times a day (BID) | ORAL | Status: DC
  Administered 2021-05-25 – 2021-05-27 (×4): 25 mg
  Filled 2021-05-25 (×4): qty 1

## 2021-05-25 MED ORDER — FUROSEMIDE 10 MG/ML IJ SOLN
60.0000 mg | Freq: Once | INTRAMUSCULAR | Status: AC
  Administered 2021-05-25: 60 mg via INTRAVENOUS
  Filled 2021-05-25: qty 6

## 2021-05-25 MED ORDER — FREE WATER
200.0000 mL | Freq: Four times a day (QID) | Status: DC
  Administered 2021-05-25 – 2021-05-26 (×3): 200 mL

## 2021-05-25 NOTE — Progress Notes (Signed)
PT Cancellation Note  Patient Details Name: Randall Hodges MRN: [id number] DOB: 10-03-1950   Cancelled Treatment:    Reason Eval/Treat Not Completed: Patient not medically ready Discussed with RN, who states not appropriate to wean sedation today; will continue efforts.  Wyona Almas, PT, DPT Acute Rehabilitation Services Pager [phone] Office [phone]    Deno Etienne 05/25/2021, 9:37 AM

## 2021-05-25 NOTE — Progress Notes (Signed)
Patient ID: Randall Hodges, male   DOB: [birth year], 71 y.o.   MRN: [id number]   Progress Note  Patient Name: Randall Hodges Date of Encounter: 05/25/2021  [facility] Cardiologist: [name], MD   Subjective   2/2: VV ECMO initiation, Crescent catheter right IJ  Remains on vent. On precedex for sedation. Will wake up and follow commands. CVP 6 with mildly negative I/Os. Received lasix 80 IV x 2 yesterday. Co-ox 73%.  Off vasopressin, remains on NE 3.  He is in NSR on amiodarone gtt.  Bivalirudin gtt for ECMO circuit. No bleeding  LDH 615 -> 380 -> 428 -> 391  Has CorTrak tube feeds.   Vent FiO2 0.5. CXR still with bilateral diffuse airspace disease. ECMO cannula is positional, get recirculation in certain positions but can be corrected.   ECMO: Speed 3100 rpm Flow 4.1 L/min pVen -81 DeltaP 19 PTT 49 (goal 50-80) LDH 615 -> 380 -> 428 -> 391 Lactate 1.7 -> 1.2 -> 1.3 -> 1.3 ABG 7.42/57/191/100%  Cardiac Studies: Echo (limited, 1/30): Echo reviewed, there is clear respirophasic variation of the interventricular septum and marked respirophasic variation of E inflow velocity on doppler evaluation of the mitral valve.  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  LV EF 45-50%.   RHC Procedural Findings (on norepinephrine 6): Hemodynamics (mmHg) RA mean 12 RV 37/12 PA 38/16, mean 27 PCWP mean 11 LV 108/12 AO 96/55 PAPI 1.8 Oxygen saturations: PA 54% AO 94% Cardiac Output (Fick) 5.59  Cardiac Index (Fick) 2.81  PVR 2.8 WU Simultaneous RV/LV tracings were obtained.  Difficult to interpret due to atrial fibrillation.  There was some suggestion of discordance (ventricular interdependence) but not clear.  Inpatient Medications    Scheduled Meds:  sodium chloride   Intravenous Once    aspirin  81 mg Per Tube Daily   chlorhexidine gluconate (MEDLINE KIT)  15 mL Mouth Rinse BID   Chlorhexidine Gluconate Cloth  6 each Topical Daily   colchicine  0.6 mg Per Tube Daily   docusate  100 mg Per Tube BID   feeding supplement (PROSource TF)  45 mL Per Tube TID   furosemide  60 mg Intravenous Once   guaiFENesin  30 mL Per Tube QID   insulin aspart  0-15 Units Subcutaneous Q4H   mouth rinse  15 mL Mouth Rinse 10 times per day   methylPREDNISolone (SOLU-MEDROL) injection  40 mg Intravenous Q12H   metoCLOPramide (REGLAN) injection  10 mg Intravenous Q6H   midodrine  5 mg Per NG tube TID WC   pantoprazole (PROTONIX) IV  40 mg Intravenous QHS   polyethylene glycol  17 g Per Tube Daily   rosuvastatin  10 mg Per Tube Daily   sodium chloride flush  10-40 mL Intracatheter Q12H   sodium chloride flush  3 mL Intravenous Q12H   Continuous Infusions:  sodium chloride Stopped (05/25/21 0136)   albumin human     amiodarone 30 mg/hr (05/25/21 0700)   bivalirudin (ANGIOMAX) infusion 0.5 mg/mL (Non-ACS indications) 0.03 mg/kg/hr (05/25/21 0700)   dexmedetomidine (PRECEDEX) IV infusion 0.7 mcg/kg/hr (05/25/21 0700)   feeding supplement (VITAL 1.5 CAL) 1,000 mL (05/25/21 0630)   fentaNYL infusion INTRAVENOUS 200 mcg/hr (05/25/21 0700)   meropenem (MERREM) IV Stopped (05/25/21 0206)   norepinephrine (LEVOPHED) Adult infusion 3 mcg/min (05/25/21 0700)   vasopressin Stopped (05/24/21 0728)  PRN Meds: Place/Maintain arterial line **AND** sodium chloride, acetaminophen (TYLENOL) oral liquid 160 mg/5 mL, albumin human, diazepam, fentaNYL (SUBLIMAZE) injection, fentaNYL (SUBLIMAZE) injection, hydrOXYzine, levalbuterol, ondansetron (ZOFRAN) IV, polyvinyl alcohol, sodium chloride flush   Vital Signs    Vitals:   05/25/21 0615 05/25/21 0630 05/25/21 0645 05/25/21 0700  BP:    (!) 103/58  Pulse: 72 75 75 73  Resp: _0 Temp:      TempSrc:      SpO2: 100% 100% 100% 100%  Weight:       Height:        Intake/Output Summary (Last 24 hours) at 05/25/2021 0750 Last data filed at 05/25/2021 0732 Gross per 24 hour  Intake 3949.22 ml  Output 4450 ml  Net -500.78 ml   Last 3 Weights 05/25/2021 05/24/2021 05/23/2021  Weight (lbs) 185 lb 6.5 oz 183 lb 10.3 oz 186 lb 8.2 oz  Weight (kg) 84.1 kg 83.3 kg 84.6 kg      Telemetry    NSR 60s (personally reviewed)  Physical Exam   General: Vent, RIJ Crescent cannula Neck: JVP 8-9, no thyromegaly or thyroid nodule.  Lungs: Bilateral crackles CV: Nondisplaced PMI.  Heart regular S1/S2, no S3/S4, no murmur.  Trace ankle edema.  Abdomen: Soft, no hepatosplenomegaly, no distention.  Skin: Intact without lesions or rashes.  Neurologic: Sedated, per nursing he will awaken and follow commands.  Extremities: No clubbing or cyanosis.  HEENT: Normal.   Labs    High Sensitivity Troponin:   Recent Labs  Lab 05/04/2021 1321 05/04/2021 1518  TROPONINIHS 3 4     Chemistry Recent Labs  Lab 05/22/21 2100 05/23/21 0320 05/23/21 0324 05/24/21 0329 05/24/21 0330 05/24/21 1613 05/24/21 1619 05/25/21 0058 05/25/21 0344 05/25/21 0528  NA  --  139   < > 142   < > 147*   < > 147* 147* 150*  K  --  4.4   < > 4.2   < > 4.2   < > 3.9 4.1 4.0  CL  --  99   < > 100  --  106  --   --  105  --   CO2  --  31   < > 33*  --  34*  --   --  35*  --   GLUCOSE  --  200*   < > 195*  --  163*  --   --  173*  --   BUN  --  53*   < > 61*  --  60*  --   --  58*  --   CREATININE  --  1.30*   < > 1.34*  --  1.25*  --   --  1.17  --   CALCIUM  --  7.9*   < > 7.7*  --  7.8*  --   --  7.8*  --   MG 1.8  --   --  2.2  --   --   --   --  2.1  --   PROT  --  4.5*  --  4.3*  --   --   --   --  4.4*  --   ALBUMIN  --  1.9*  --  2.0*  --   --   --   --  2.0*  --   AST  --  327*  --  134*  --   --   --   --  165*  --  ALT  --  350*  --  255*  --   --   --   --  274*  --   ALKPHOS  --  158*  --  121  --   --   --   --  155*  --   BILITOT  --  1.0  --  1.1  --   --    --   --  0.7  --   GFRNONAA  --  59*   < > 57*  --  >60  --   --  >60  --   ANIONGAP  --  9   < > 9  --  7  --   --  7  --    < > = values in this interval not displayed.    Lipids  No results for input(s): CHOL, TRIG, HDL, LABVLDL, LDLCALC, CHOLHDL in the last 168 hours.   Hematology Recent Labs  Lab 05/24/21 0329 05/24/21 0330 05/24/21 1613 05/24/21 1619 05/25/21 0058 05/25/21 0344 05/25/21 0528  WBC 9.4  --  11.1*  --   --  12.9*  --   RBC 2.54*  --  3.27*  --   --  3.32*  --   HGB 7.4*   < > 9.1*   < > 9.2* 9.6* 9.5*  HCT 23.6*   < > 29.1*   < > 27.0* 29.7* 28.0*  MCV 92.9  --  89.0  --   --  89.5  --   MCH 29.1  --  27.8  --   --  28.9  --   MCHC 31.4  --  31.3  --   --  32.3  --   RDW 15.4  --  16.2*  --   --  16.2*  --   PLT 165  --  186  --   --  195  --    < > = values in this interval not displayed.   Thyroid No results for input(s): TSH, FREET4 in the last 168 hours.  BNPNo results for input(s): BNP, PROBNP in the last 168 hours.  DDimer No results for input(s): DDIMER in the last 168 hours.   Radiology    DG CHEST PORT 1 VIEW  Result Date: 05/24/2021 CLINICAL DATA:  Acute respiratory failure.  Ventilator support. EXAM: PORTABLE CHEST 1 VIEW COMPARISON:  05/24/2021 earlier same day FINDINGS: Endotracheal tube tip 3 cm above the carina. Left subclavian central line tip in the SVC above the right atrium. ECMO device overlies the region, unchanged. Soft feeding tube enters the abdomen. Widespread pulmonary edema persists but is slightly diminished. No worsening or new finding. Acute fracture of the right first and second ribs as seen previously. IMPRESSION: Endotracheal tube advanced slightly, tip now 3 cm above the carina. Slightly improved/diminished pulmonary edema. No other change. Electronically Signed   By: Nelson Chimes M.D.   On: 05/24/2021 17:20   DG Chest Port 1 View  Result Date: 05/24/2021 CLINICAL DATA:  ARDS. EXAM: PORTABLE CHEST 1 VIEW COMPARISON:   05/23/2021 FINDINGS: 0457 hours. The cardio pericardial silhouette is enlarged. Diffuse bilateral airspace disease is similar to prior. Endotracheal tube tip is 5.8 cm above the base of the carina. Feeding tube passes into the duodenum although the tip is not included on the film. Left subclavian central line tip overlies the distal SVC level. ECMO cannula again noted over the right chest and paramidline abdomen. Stable appearance of the right second rib  fracture. No pneumothorax or substantial pleural effusion. IMPRESSION: 1. No substantial interval change in exam. 2. Diffuse bilateral airspace disease. 3. Support apparatus as above. Electronically Signed   By: Misty Stanley M.D.   On: 05/24/2021 08:08    Cardiac Studies   Cath 05/19/2021 Distal left main [place] 111 bifurcation stenosis with 75% left main, 90% ostial to proximal LAD, and 80-90% ostial circumflex (difficult to assess due to heavy calcification). Severe mid circumflex disease with 70% eccentric mid stenosis and second obtuse marginal containing ostial to proximal greater than 80% stenosis.  (Bifurcation [place] 111 Severe calcification in left main and LAD in particular with diffuse 50% narrowing from proximal to mid vessel and tandem 70% stenoses in the mid LAD. Nondominant right coronary Normal LV function.  EF 55%.  LVEDP normal.    Patient Profile     71 y.o. male with PMH of PVCs presented with chest pain. Cardiac cath by Dr. Tamala Julian on 05/07/2021 showed 75% left main, 90% ost to prox LAD, 80-90% ost LCx, 70% mid LCx, 80% OM2, 50% prox to mid LAD, 70% mid LAD, EF 55%. Patient underwent CABG x 3 on 05/07/2021. Post op course complicated afib, treated with amio. CXR showed opacity in bilateral lung, started abx on 1/25 and diuretic. Started on Eliquis due to recurrence of afib.    Assessment & Plan    1. CAD: Admitted with unstable angina, cath with severe left main and proximal LAD/LCx disease (nondominant RCA).  CABG x 3 on 1/18  with LIMA-LAD, SVG-OM, SVG-left PDA.  No s/s angina  - Continue ASA 81, statin.  2. 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 RHC 1/30 with equalization of diastolic pressures.  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  Repeated echo 2/2 still showed respirophasic septal variation but not as impressive. CVP 6 today with I/Os slightly negative, co-ox 73%.  - Lasix 60 mg IV x 1 today, keep even to mildly negative.  - With concern for development of post-surgical effusive/constrictive pericarditis, he is on colchicine.  - ?need for surgical intervention on effusive/constrictive pericarditis. Will need improvement of lung disease first then reassess, repeat echo not as impressive.   3. 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  Suspect primarily septic/vasodilatory shock. He is currently off vasopressin and on NE 3 with stable MAP. Lactate normal.  - Continue midodrine 5 mg tid.  - Wean NE off if possible.  4. Hiccups: Had been intractable, now seem resolved.  5. PNA: Of note, he does appear to have had some pre-existing ILD from 2019 CT chest (?sarcoidosis). CXR with persistent bilateral infiltrates, possible mild improvement compared to yesterday.  Have thought most likely aspiration PNA/pneumonitis in setting of intractable hiccups. He has developred ARDS.  Afebrile. COVID was negative.  Good ABG this morning, FiO2 0.5.  - Discussed with pulmonary, think amiodarone toxicity unlikely as he has only been on it post-op.  - He has now completed course  of meropenem/vancomycin.  - Vent per CCM - Solumedrol 40 mg IV bid.  - ECMO cannula is positional, last ABG was good.  Will watch for now and try to externally reposition.  Could probably pull back  cannula a bit but with good ABG currently I am reticent just yet.  6. Atypical atrial flutter/PVCs:  Remains in NSR - Continue amiodarone 30 mg/hr. - Bivalirudin gtt, goal PTT 50-80.  Discussed dosing with PharmD personally. 7. AKI: Creatinine 1.87 => 1.69 => 1.3 => 1.34 => 1.17.  Follow closely.  8. Elevated LFTs: Suspect shock liver, follow CMET.  LFTs trending down.  9. Anemia: Hgb 7.7 => 9.6 with 1 unit PRBCs.   CRITICAL CARE Performed by: Loralie Champagne  Total critical care time: 40 minutes  Critical care time was exclusive of separately billable procedures and treating other patients.  Critical care was necessary to treat or prevent imminent or life-threatening deterioration.  Critical care was time spent personally by me on the following activities: development of treatment plan with patient and/or surrogate as well as nursing, discussions with consultants, evaluation of patient's response to treatment, examination of patient, obtaining history from patient or surrogate, ordering and performing treatments and interventions, ordering and review of laboratory studies, ordering and review of radiographic studies, pulse oximetry and re-evaluation of patient's condition.  Loralie Champagne MD 05/25/2021 7:50 AM

## 2021-05-25 NOTE — Progress Notes (Addendum)
Nutrition Follow-up  DOCUMENTATION CODES:   Not applicable  INTERVENTION:   Tube Feeding via Cortrak: Vital 1.5 at 60 ml/hr Add Pro-Source TF 45 mL TID Provides 2280 kcals, 130 g of protein and 1094 mL of free water  Recommend addition of free water: 200 mL q 6 hours   NUTRITION DIAGNOSIS:   Inadequate oral intake related to inability to eat as evidenced by NPO status.  Being addressed via TF   GOAL:   Patient will meet greater than or equal to 90% of their needs  Progressing  MONITOR:   TF tolerance, Diet advancement, Labs, Weight trends  REASON FOR ASSESSMENT:   Rounds    ASSESSMENT:   71 yo male admitted with acute coronary syndrome, underwent CABG x 3 on 1/18; developed intractable hiccups/muscle spasms, aspiration pneumonia with respiratory failure. PMH includes prostate cancer  1/18 CABG x 3 1/29 Transferred back to ICU due to hypotension, worsening respiratory status 1/30 Cortrak placed-tip near LOT, TF initiated 2/02 Intubated, VV ECMO cannulation for ARDS  Pt remains on vent support, on VV ECMO. Levophed at 3 mcg/min, off vasopressin  Tolerating Vital 1.5 at 60 ml/hr, Pro-Source TF 45 mL TID via Cortrak  Rectal tube remains on place  No new skin breakdown  Labs: sodium 148 (H), Creatinine wdl,  Meds: colace, miralax, reglan, solumedrol, ss novolog   Diet Order:   Diet Order             Diet NPO time specified  Diet effective now                   EDUCATION NEEDS:   Education needs have been addressed  Skin:  Skin Assessment: Skin Integrity Issues: Skin Integrity Issues:: Stage II Stage II: nose d/t BiPap mask Incisions: sternum, leg, groin  Last BM:  2/6 rectal tube  Height:   Ht Readings from Last 1 Encounters:  05/24/21 5\' 10"  (1.778 m)    Weight:   Wt Readings from Last 1 Encounters:  05/25/21 84.1 kg    BMI:  Body mass index is 26.6 kg/m.  Estimated Nutritional Needs:   Kcal:  2000-2200 kcals  Protein:   125-150 g  Fluid:  1.8 L   Kerman Passey MS, RDN, LDN, CNSC Registered Dietitian III Clinical Nutrition RD Pager and On-Call Pager Number Located in [place]

## 2021-05-25 NOTE — Progress Notes (Addendum)
NAME:  Randall Hodges, MRN:  [id number], DOB:  March 25, 1951, LOS: 56 ADMISSION DATE:  05/15/2021, CONSULTATION DATE: 05/15/2021 REFERRING MD:  Dahlia Byes, MD, CHIEF COMPLAINT: Increasing shortness of breath  History of Present Illness:  71 year old male with coronary artery disease who initially presented with chest pain, noted to have multivessel coronary artery disease, he underwent CABG x3 on 04/1818 23, course was complicated with atrial fibrillation, frequent hiccups and aspiration pneumonia leading to hypoxia and increasing oxygen requirement. PCCM was consulted for evaluation and help with management  Patient stated hiccups are better but continued complain of shortness of breath, cough unable to bring up phlegm.  Denies chest pain or palpitation During my evaluation patient is on high flow nasal cannula oxygen at 12 L and he is requiring low-dose Levophed to maintain MAP above 65.  Of note patient had echocardiogram repeated today, consistent with constrictive picture.  Pertinent  Medical History   Past Medical History:  Diagnosis Date   Allergic rhinitis    Prostate cancer ([place])     Significant Hospital Events: Including procedures, antibiotic start and stop dates in addition to other pertinent events   1/18 CABG 1/30 PCCM consult, Worsening O2 needs, likely aspiration, new pressor need 2/2 intubation, placed on VV ECMO 2/5 issues with recirc, bronch neg, improved with supine positioning  Interim History / Subjective:  CVP down, diuresed well. Recirc issues yesterday evening. Clot at oxygenator preventing pre and post evaluation  Objective   Blood pressure (!) 103/58, pulse 73, temperature 98.2 F (36.8 C), temperature source Core, resp. rate (!) 9, height 5\' 10"  (1.778 m), weight 84.1 kg, SpO2 100 %. CVP:  [3 mmHg-12 mmHg] 5 mmHg  Vent Mode: PRVC FiO2 (%):  [50 %-100 %] 50 % Set Rate:  [15 bmp-20 bmp] 20 bmp Vt Set:  [150 mL-290 mL] 150 mL PEEP:  [10 cmH20-12  cmH20] 12 cmH20 Plateau Pressure:  [20 cmH20-28 cmH20] 22 cmH20   Intake/Output Summary (Last 24 hours) at 05/25/2021 2440 Last data filed at 05/25/2021 0700 Gross per 24 hour  Intake 3949.22 ml  Output 4375 ml  Net -425.78 ml    Filed Weights   05/23/21 0411 05/24/21 0440 05/25/21 0500  Weight: 84.6 kg 83.3 kg 84.1 kg    Examination: No distress Lungs more clear TV 200 on PC 15/10 Circuit with clot at apex of oxygenator Less anasarca Moves all 4 ext to command  Current drips: fent, amio, precedex, bival, levophed CVP 6  Lab review Improved liver and kidney LDH 1400>>615>>380>>428>>391 CBG ok CBC stable, H/H responded to transfusion Lactate flat and normal PTT 49 INR 2 Fibrinogen improved Coox 72%  CXR stable ARDS  Resolved Hospital Problem list   Hiccups postop  Assessment & Plan:  CAD with unstable angina s/p CABGx3 1/18 (LIMA-LAD, SVG-OM, SVG-left PDA) Postop recurrent shock question constrictive pericaritis type physiology vs. Septic Presumed aspiration pneumonitis vs. HCAP vs. ?AEILD culminating in severe ARDS with inability to ventilate along with high Aa gradient s/p VV ECMO cannulation 05/21/22.  CXR deteriorated between 1/26 and 1/29.   Need for [facility] for ECMO Atypical aflutter on amio Baseline fibrotic lung disease with preserved PFTs 2019, question of sarcoid- lost to f/u, see Dr. Matilde Bash note 08/15/17 AKI- multifactorial (sepsis, poor cardiac outflow, shock) improved Bilateral effusions- post CABG, R>L on bedside US (2/3), do not think would help vent wean at this time to drain  - Diuresis, flow, and sweep wean per CHF team: may need to adjust cannula  position today, will discuss - Usual hemolysis panel monitoring - Bival with PharmD aPTT monitoring and adjustment - Amio gtt for now - Switched back to PC 15, PRVC only allowed TV of 150 for DP <15 - Fent/Precedex titrated to RASS -1 - Continue meropenem x 7 days - Continue steroids for now; wean once  starts to improve - PT/OT, TF as ordered  Best Practice (right click and "Reselect all SmartList Selections" daily)   Diet/type: Vital @ 75mL/hr DVT prophylaxis: Bival CBG: yes, SSI GI prophylaxis: PPI Lines: L Vona TLC, RIJ  Foley:  Yes, keep Code Status:  full code Last date of multidisciplinary goals of care discussion [daily at bedside]  44 min cc time Erskine Emery MD PCCM

## 2021-05-25 NOTE — Progress Notes (Signed)
OT Cancellation Note  Patient Details Name: Randall Hodges MRN: [id number] DOB: 02-07-1951   Cancelled Treatment:    Reason Eval/Treat Not Completed: Medical issues which prohibited therapy (Discussed with RN, who states not appropriate to wean sedation today. Will follow and return as medically stable.)  [place], OTR/L Acute Rehab Pager: [phone] Office: [phone] 05/25/2021, 10:08 AM

## 2021-05-25 NOTE — Progress Notes (Signed)
4 Days Post-Op Procedure(s) (LRB): ECMO CANNULATION (Bilateral) Subjective: Patient examined and today's portable chest x-ray image personally reviewed. Patient's condition and plan of care discussed with wife at bedside Chest x-ray shows gradual improvement with addition of steroids. BAL cultures no growth PCO2 remains too high to initiate sweep weaning from circuit Objective: Vital signs in last 24 hours: Temp:  [97.9 F (36.6 C)-98.2 F (36.8 C)] 98.2 F (36.8 C) (02/06 0400) Pulse Rate:  [64-90] 69 (02/06 0845) Cardiac Rhythm: Normal sinus rhythm (02/06 0800) Resp:  [0-20] 20 (02/06 0845) BP: (89-131)/(45-69) 131/45 (02/06 0807) SpO2:  [95 %-100 %] 100 % (02/06 0845) Arterial Line BP: (116-190)/(39-60) 127/43 (02/06 0845) FiO2 (%):  [50 %-100 %] 50 % (02/06 0807) Weight:  [84.1 kg] 84.1 kg (02/06 0500)  Hemodynamic parameters for last 24 hours: CVP:  [3 mmHg-12 mmHg] 6 mmHg  Intake/Output from previous day: 02/05 0701 - 02/06 0700 In: 3949.2 [I.V.:1414.3; Blood:315; NG/GT:1540; IV Piggyback:649.9] Out: 1443 [Urine:4025; Stool:350] Intake/Output this shift: Total I/O In: 457.3 [I.V.:117.3; NG/GT:340] Out: 240 [Urine:240]  Exam Sedated but responds to commands slightly opens eyes Sternal incision well-healed Normal sinus rhythm without murmur Abdominal nondistended Crescent cannula insertion site clean and dry without hematoma Periphery warm with minimal edema  Lab Results: Recent Labs    05/24/21 1613 05/24/21 1619 05/25/21 0344 05/25/21 0528  WBC 11.1*  --  12.9*  --   HGB 9.1*   < > 9.6* 9.5*  HCT 29.1*   < > 29.7* 28.0*  PLT 186  --  195  --    < > = values in this interval not displayed.   BMET:  Recent Labs    05/24/21 1613 05/24/21 1619 05/25/21 0344 05/25/21 0528  NA 147*   < > 147* 150*  K 4.2   < > 4.1 4.0  CL 106  --  105  --   CO2 34*  --  35*  --   GLUCOSE 163*  --  173*  --   BUN 60*  --  58*  --   CREATININE 1.25*  --  1.17  --    CALCIUM 7.8*  --  7.8*  --    < > = values in this interval not displayed.    PT/INR:  Recent Labs    05/25/21 0344  LABPROT 22.3*  INR 2.0*   ABG    Component Value Date/Time   PHART 7.421 05/25/2021 0528   HCO3 36.9 (H) 05/25/2021 0528   TCO2 39 (H) 05/25/2021 0528   ACIDBASEDEF 0.6 05/20/2021 0950   O2SAT 72.9 05/25/2021 0529   CBG (last 3)  Recent Labs    05/24/21 2328 05/25/21 0344 05/25/21 0736  GLUCAP 147* 149* 144*    Assessment/Plan: S/P Procedure(s) (LRB): ECMO CANNULATION (Bilateral) Some evidence of recirculation in the ECMO circuit which has been improved with extension of his neck.  May need repositioning-withdrawal of the crescent cannula at some point-we will discuss with ECMO team. Continue IV amiodarone, meropenem and steroids.   LOS: 20 days    Randall Hodges 05/25/2021

## 2021-05-25 NOTE — Progress Notes (Addendum)
ANTICOAGULATION CONSULT NOTE -  Pharmacy Consult for Bivalirudin Indication:  ECMO  Allergies  Allergen Reactions   Amoxicillin-Pot Clavulanate     Per pt report on 08/19/20, makes his urine dark colored   Atorvastatin Other (See Comments)     ( pt states causing runny nose, headaches, issue w/ urination)   Plant Derived Enzymes     Other reaction(s): Unknown   Trichophyton Other (See Comments)    Patient Measurements: Height: 5\' 10"  (177.8 cm) Weight: 84.1 kg (185 lb 6.5 oz) (reweighed multiple times) IBW/kg (Calculated) : 73  Vital Signs: Temp: 98.2 F (36.8 C) (02/06 0400) Temp Source: Core (02/06 0400) BP: 103/58 (02/06 0700) Pulse Rate: 73 (02/06 0700)  Labs: Recent Labs    05/22/21 1122 05/22/21 1125 05/23/21 0320 05/23/21 0324 05/24/21 0329 05/24/21 0330 05/24/21 1613 05/24/21 1619 05/25/21 0058 05/25/21 0344 05/25/21 0528  HGB  --    < > 7.9*   < > 7.4*   < > 9.1*   < > 9.2* 9.6* 9.5*  HCT  --    < > 23.7*   < > 23.6*   < > 29.1*   < > 27.0* 29.7* 28.0*  PLT  --    < > 175   < > 165  --  186  --   --  195  --   APTT 60*   < > 55*   < > 55*  --  53*  --   --  49*  --   LABPROT  --   --  22.1*  --  22.3*  --   --   --   --  22.3*  --   INR  --   --  1.9*  --  2.0*  --   --   --   --  2.0*  --   HEPARINUNFRC 0.10*  --   --   --   --   --   --   --   --   --   --   CREATININE  --    < > 1.30*   < > 1.34*  --  1.25*  --   --  1.17  --    < > = values in this interval not displayed.     Estimated Creatinine Clearance: 59.8 mL/min (by C-G formula based on SCr of 1.17 mg/dL).   Medical History: Past Medical History:  Diagnosis Date   Allergic rhinitis    Prostate cancer ([place])     Medications:  Infusions:   sodium chloride Stopped (05/25/21 0136)   albumin human     amiodarone 30 mg/hr (05/25/21 0700)   bivalirudin (ANGIOMAX) infusion 0.5 mg/mL (Non-ACS indications) 0.03 mg/kg/hr (05/25/21 0700)   dexmedetomidine (PRECEDEX) IV infusion 0.7 mcg/kg/hr  (05/25/21 0700)   feeding supplement (VITAL 1.5 CAL) 1,000 mL (05/25/21 0630)   fentaNYL infusion INTRAVENOUS 200 mcg/hr (05/25/21 0700)   meropenem (MERREM) IV Stopped (05/25/21 0206)   norepinephrine (LEVOPHED) Adult infusion 3 mcg/min (05/25/21 0700)   vasopressin Stopped (05/24/21 2423)    Assessment: 71 year old male with coronary artery disease who initially presented with chest pain, noted to have multivessel coronary artery disease, he underwent CABG x3 on 05/17/2021, course was complicated with atrial fibrillation, frequent hiccups and aspiration pneumonia leading to hypoxia and increasing oxygen requirement. Patient now requiring VV ECMO. Pharmacy consulted for bivalirudin IV.   aPTT is therapeutic at 49 sec (on bival 0.03 mg/kg/hr). Hgb stable at 9.5, plt 195, LDH  391. Fibrinogen 431. No issues with infusion or bleeding per RN - small amount of fibrin in circuit but stable.  Goal of Therapy:  aPTT 50-80 seconds Monitor platelets by anticoagulation protocol: Yes   Plan:  Increase bivalirudin to 0.033 mg/kg/hr (5.6 ml/hr) Aptt and CBC q12 hour checks   Thank you for allowing pharmacy to be a part of this patients care.  Antonietta Jewel, PharmD, [place] Clinical Pharmacist  Phone: [phone] 05/25/2021 7:39 AM  Please check AMION for all [facility] phone numbers After 10:00 PM, call [place] [phone]

## 2021-05-25 NOTE — Progress Notes (Signed)
Patient ID: IVERY NANNEY, male   DOB: [birth year], 71 y.o.   MRN: [id number]  Extracorporeal support note   ECLS support day: 4 Indication: ARDS  Configuration: VV  Drainage cannula: Crescent R IJ Return cannula: Crescent R IJ  Pump speed: 3100 rpm Pump flow: 4.1 L/min Pump used: Cardiohelp  Sweep gas: 5  Circuit check: Minimal thrombus. Good color change. LDH stable.  Anticoagulant: Bivalirudin, PTT goal 50-80  Changes in support: Continue current support. Diurese. Vent adjusted per CCM.   Anticipated goals/duration of support: Wean to discontinuation  Loralie Champagne, MD  8:01 AM

## 2021-05-25 NOTE — Progress Notes (Signed)
ANTICOAGULATION CONSULT NOTE -  Pharmacy Consult for Bivalirudin Indication:  ECMO  Allergies  Allergen Reactions   Amoxicillin-Pot Clavulanate     Per pt report on 08/19/20, makes his urine dark colored   Atorvastatin Other (See Comments)     ( pt states causing runny nose, headaches, issue w/ urination)   Plant Derived Enzymes     Other reaction(s): Unknown   Trichophyton Other (See Comments)    Patient Measurements: Height: 5\' 10"  (177.8 cm) Weight: 84.1 kg (185 lb 6.5 oz) (reweighed multiple times) IBW/kg (Calculated) : 73  Vital Signs: Temp: 98.6 F (37 C) (02/06 1600) Temp Source: Core (02/06 1600) BP: 98/58 (02/06 1800) Pulse Rate: 76 (02/06 1800)  Labs: Recent Labs    05/23/21 0320 05/23/21 0324 05/24/21 0329 05/24/21 0330 05/24/21 1613 05/24/21 1619 05/25/21 0344 05/25/21 0528 05/25/21 1535 05/25/21 1544 05/25/21 1727  HGB 7.9*   < > 7.4*   < > 9.1*   < > 9.6*   < > 9.9* 10.2* 9.9*  HCT 23.7*   < > 23.6*   < > 29.1*   < > 29.7*   < > 30.8* 30.0* 29.0*  PLT 175   < > 165  --  186  --  195  --  202  --   --   APTT 55*   < > 55*  --  53*  --  49*  --  47*  --   --   LABPROT 22.1*  --  22.3*  --   --   --  22.3*  --   --   --   --   INR 1.9*  --  2.0*  --   --   --  2.0*  --   --   --   --   CREATININE 1.30*   < > 1.34*  --  1.25*  --  1.17  --  1.17  --   --    < > = values in this interval not displayed.     Estimated Creatinine Clearance: 59.8 mL/min (by C-G formula based on SCr of 1.17 mg/dL).   Medical History: Past Medical History:  Diagnosis Date   Allergic rhinitis    Prostate cancer ([place])     Medications:  Infusions:   sodium chloride Stopped (05/25/21 0136)   albumin human     amiodarone 30 mg/hr (05/25/21 1800)   bivalirudin (ANGIOMAX) infusion 0.5 mg/mL (Non-ACS indications) 0.033 mg/kg/hr (05/25/21 1800)   dexmedetomidine (PRECEDEX) IV infusion 0.7 mcg/kg/hr (05/25/21 1800)   feeding supplement (VITAL 1.5 CAL) 1,000 mL (05/25/21  0630)   fentaNYL infusion INTRAVENOUS 225 mcg/hr (05/25/21 1800)   meropenem (MERREM) IV 200 mL/hr at 05/25/21 1800   norepinephrine (LEVOPHED) Adult infusion Stopped (05/25/21 1559)   vasopressin Stopped (05/24/21 9562)    Assessment: 71 year old male with coronary artery disease who initially presented with chest pain, noted to have multivessel coronary artery disease, he underwent CABG x3 on 04/29/2021, course was complicated with atrial fibrillation, frequent hiccups and aspiration pneumonia leading to hypoxia and increasing oxygen requirement. Patient now requiring VV ECMO. Pharmacy consulted for bivalirudin IV.   aPTT is below goal at 47 sec (on bival 0.033mg /kg/hr). Hgb stable at 9.9, plt 202, LDH 391. Fibrinogen 431. No issues with infusion or bleeding per RN - small amount of fibrin in circuit but stable.  Goal of Therapy:  aPTT 50-80 seconds Monitor platelets by anticoagulation protocol: Yes   Plan:  Increase bivalirudin to 0.038 mg/kg/hr (  6.4 ml/hr) Aptt and CBC q12 hour checks   Thank you for allowing pharmacy to be a part of this patients care.  Erin Hearing PharmD., BCPS Clinical Pharmacist 05/25/2021 6:16 PM

## 2021-05-26 ENCOUNTER — Inpatient Hospital Stay: Payer: Self-pay

## 2021-05-26 ENCOUNTER — Inpatient Hospital Stay (HOSPITAL_COMMUNITY): Payer: PPO

## 2021-05-26 DIAGNOSIS — Z9281 Personal history of extracorporeal membrane oxygenation (ECMO): Secondary | ICD-10-CM

## 2021-05-26 DIAGNOSIS — J9601 Acute respiratory failure with hypoxia: Secondary | ICD-10-CM

## 2021-05-26 DIAGNOSIS — Z930 Tracheostomy status: Secondary | ICD-10-CM | POA: Diagnosis not present

## 2021-05-26 DIAGNOSIS — I249 Acute ischemic heart disease, unspecified: Secondary | ICD-10-CM | POA: Diagnosis not present

## 2021-05-26 DIAGNOSIS — Z515 Encounter for palliative care: Secondary | ICD-10-CM | POA: Diagnosis not present

## 2021-05-26 LAB — POCT I-STAT 7, (LYTES, BLD GAS, ICA,H+H)
Acid-Base Excess: 12 mmol/L — ABNORMAL HIGH (ref 0.0–2.0)
Acid-Base Excess: 10 mmol/L — ABNORMAL HIGH (ref 0.0–2.0)
Acid-Base Excess: 11 mmol/L — ABNORMAL HIGH (ref 0.0–2.0)
Acid-Base Excess: 7 mmol/L — ABNORMAL HIGH (ref 0.0–2.0)
Acid-Base Excess: 8 mmol/L — ABNORMAL HIGH (ref 0.0–2.0)
Acid-Base Excess: 8 mmol/L — ABNORMAL HIGH (ref 0.0–2.0)
Acid-Base Excess: 7 mmol/L — ABNORMAL HIGH (ref 0.0–2.0)
Acid-Base Excess: 7 mmol/L — ABNORMAL HIGH (ref 0.0–2.0)
Acid-Base Excess: 4 mmol/L — ABNORMAL HIGH (ref 0.0–2.0)
Bicarbonate: 37.6 mmol/L — ABNORMAL HIGH (ref 20.0–28.0)
Bicarbonate: 36.8 mmol/L — ABNORMAL HIGH (ref 20.0–28.0)
Bicarbonate: 38.2 mmol/L — ABNORMAL HIGH (ref 20.0–28.0)
Bicarbonate: 35.5 mmol/L — ABNORMAL HIGH (ref 20.0–28.0)
Bicarbonate: 37.7 mmol/L — ABNORMAL HIGH (ref 20.0–28.0)
Bicarbonate: 35.8 mmol/L — ABNORMAL HIGH (ref 20.0–28.0)
Bicarbonate: 34.5 mmol/L — ABNORMAL HIGH (ref 20.0–28.0)
Bicarbonate: 34.3 mmol/L — ABNORMAL HIGH (ref 20.0–28.0)
Bicarbonate: 31 mmol/L — ABNORMAL HIGH (ref 20.0–28.0)
Calcium, Ion: 1.17 mmol/L (ref 1.15–1.40)
Calcium, Ion: 1.2 mmol/L (ref 1.15–1.40)
Calcium, Ion: 1.21 mmol/L (ref 1.15–1.40)
Calcium, Ion: 1.26 mmol/L (ref 1.15–1.40)
Calcium, Ion: 1.26 mmol/L (ref 1.15–1.40)
Calcium, Ion: 1.22 mmol/L (ref 1.15–1.40)
Calcium, Ion: 1.21 mmol/L (ref 1.15–1.40)
Calcium, Ion: 1.2 mmol/L (ref 1.15–1.40)
Calcium, Ion: 1.19 mmol/L (ref 1.15–1.40)
HCT: 27 % — ABNORMAL LOW (ref 39.0–52.0)
HCT: 29 % — ABNORMAL LOW (ref 39.0–52.0)
HCT: 32 % — ABNORMAL LOW (ref 39.0–52.0)
HCT: 34 % — ABNORMAL LOW (ref 39.0–52.0)
HCT: 34 % — ABNORMAL LOW (ref 39.0–52.0)
HCT: 32 % — ABNORMAL LOW (ref 39.0–52.0)
HCT: 35 % — ABNORMAL LOW (ref 39.0–52.0)
HCT: 34 % — ABNORMAL LOW (ref 39.0–52.0)
HCT: 34 % — ABNORMAL LOW (ref 39.0–52.0)
Hemoglobin: 9.2 g/dL — ABNORMAL LOW (ref 13.0–17.0)
Hemoglobin: 9.9 g/dL — ABNORMAL LOW (ref 13.0–17.0)
Hemoglobin: 10.9 g/dL — ABNORMAL LOW (ref 13.0–17.0)
Hemoglobin: 11.6 g/dL — ABNORMAL LOW (ref 13.0–17.0)
Hemoglobin: 11.6 g/dL — ABNORMAL LOW (ref 13.0–17.0)
Hemoglobin: 10.9 g/dL — ABNORMAL LOW (ref 13.0–17.0)
Hemoglobin: 11.9 g/dL — ABNORMAL LOW (ref 13.0–17.0)
Hemoglobin: 11.6 g/dL — ABNORMAL LOW (ref 13.0–17.0)
Hemoglobin: 11.6 g/dL — ABNORMAL LOW (ref 13.0–17.0)
O2 Saturation: 98 %
O2 Saturation: 99 %
O2 Saturation: 99 %
O2 Saturation: 97 %
O2 Saturation: 97 %
O2 Saturation: 98 %
O2 Saturation: 99 %
O2 Saturation: 100 %
O2 Saturation: 95 %
Patient temperature: 36.9
Patient temperature: 36.8
Patient temperature: 36.6
Patient temperature: 36.8
Patient temperature: 36.6
Patient temperature: 36.8
Patient temperature: 36.9
Patient temperature: 36.9
Potassium: 4.2 mmol/L (ref 3.5–5.1)
Potassium: 4.3 mmol/L (ref 3.5–5.1)
Potassium: 4.5 mmol/L (ref 3.5–5.1)
Potassium: 4.6 mmol/L (ref 3.5–5.1)
Potassium: 4.7 mmol/L (ref 3.5–5.1)
Potassium: 4.6 mmol/L (ref 3.5–5.1)
Potassium: 4.6 mmol/L (ref 3.5–5.1)
Potassium: 4.5 mmol/L (ref 3.5–5.1)
Potassium: 4.4 mmol/L (ref 3.5–5.1)
Sodium: 150 mmol/L — ABNORMAL HIGH (ref 135–145)
Sodium: 151 mmol/L — ABNORMAL HIGH (ref 135–145)
Sodium: 151 mmol/L — ABNORMAL HIGH (ref 135–145)
Sodium: 150 mmol/L — ABNORMAL HIGH (ref 135–145)
Sodium: 151 mmol/L — ABNORMAL HIGH (ref 135–145)
Sodium: 150 mmol/L — ABNORMAL HIGH (ref 135–145)
Sodium: 150 mmol/L — ABNORMAL HIGH (ref 135–145)
Sodium: 152 mmol/L — ABNORMAL HIGH (ref 135–145)
Sodium: 152 mmol/L — ABNORMAL HIGH (ref 135–145)
TCO2: 39 mmol/L — ABNORMAL HIGH (ref 22–32)
TCO2: 39 mmol/L — ABNORMAL HIGH (ref 22–32)
TCO2: 40 mmol/L — ABNORMAL HIGH (ref 22–32)
TCO2: 38 mmol/L — ABNORMAL HIGH (ref 22–32)
TCO2: 40 mmol/L — ABNORMAL HIGH (ref 22–32)
TCO2: 38 mmol/L — ABNORMAL HIGH (ref 22–32)
TCO2: 36 mmol/L — ABNORMAL HIGH (ref 22–32)
TCO2: 36 mmol/L — ABNORMAL HIGH (ref 22–32)
TCO2: 33 mmol/L — ABNORMAL HIGH (ref 22–32)
pCO2 arterial: 54.8 mmHg — ABNORMAL HIGH (ref 32.0–48.0)
pCO2 arterial: 61 mmHg — ABNORMAL HIGH (ref 32.0–48.0)
pCO2 arterial: 64.7 mmHg — ABNORMAL HIGH (ref 32.0–48.0)
pCO2 arterial: 73.3 mmHg (ref 32.0–48.0)
pCO2 arterial: 86.4 mmHg (ref 32.0–48.0)
pCO2 arterial: 64 mmHg — ABNORMAL HIGH (ref 32.0–48.0)
pCO2 arterial: 59.9 mmHg — ABNORMAL HIGH (ref 32.0–48.0)
pCO2 arterial: 62.3 mmHg — ABNORMAL HIGH (ref 32.0–48.0)
pCO2 arterial: 55.8 mmHg — ABNORMAL HIGH (ref 32.0–48.0)
pH, Arterial: 7.445 (ref 7.350–7.450)
pH, Arterial: 7.388 (ref 7.350–7.450)
pH, Arterial: 7.379 (ref 7.350–7.450)
pH, Arterial: 7.247 — ABNORMAL LOW (ref 7.350–7.450)
pH, Arterial: 7.291 — ABNORMAL LOW (ref 7.350–7.450)
pH, Arterial: 7.354 (ref 7.350–7.450)
pH, Arterial: 7.367 (ref 7.350–7.450)
pH, Arterial: 7.348 — ABNORMAL LOW (ref 7.350–7.450)
pH, Arterial: 7.352 (ref 7.350–7.450)
pO2, Arterial: 113 mmHg — ABNORMAL HIGH (ref 83.0–108.0)
pO2, Arterial: 127 mmHg — ABNORMAL HIGH (ref 83.0–108.0)
pO2, Arterial: 166 mmHg — ABNORMAL HIGH (ref 83.0–108.0)
pO2, Arterial: 104 mmHg (ref 83.0–108.0)
pO2, Arterial: 105 mmHg (ref 83.0–108.0)
pO2, Arterial: 123 mmHg — ABNORMAL HIGH (ref 83.0–108.0)
pO2, Arterial: 168 mmHg — ABNORMAL HIGH (ref 83.0–108.0)
pO2, Arterial: 208 mmHg — ABNORMAL HIGH (ref 83.0–108.0)
pO2, Arterial: 79 mmHg — ABNORMAL LOW (ref 83.0–108.0)

## 2021-05-26 LAB — COOXEMETRY PANEL
Carboxyhemoglobin: 1.7 % — ABNORMAL HIGH (ref 0.5–1.5)
Carboxyhemoglobin: 1.4 % (ref 0.5–1.5)
Methemoglobin: 0.9 % (ref 0.0–1.5)
Methemoglobin: 1.1 % (ref 0.0–1.5)
O2 Saturation: 79.4 %
O2 Saturation: 76.5 %
Total hemoglobin: 10.1 g/dL — ABNORMAL LOW (ref 12.0–16.0)
Total hemoglobin: 14.6 g/dL (ref 12.0–16.0)

## 2021-05-26 LAB — BASIC METABOLIC PANEL
Anion gap: 6 (ref 5–15)
Anion gap: 8 (ref 5–15)
BUN: 56 mg/dL — ABNORMAL HIGH (ref 8–23)
BUN: 57 mg/dL — ABNORMAL HIGH (ref 8–23)
CO2: 35 mmol/L — ABNORMAL HIGH (ref 22–32)
CO2: 33 mmol/L — ABNORMAL HIGH (ref 22–32)
Calcium: 7.8 mg/dL — ABNORMAL LOW (ref 8.9–10.3)
Calcium: 8.3 mg/dL — ABNORMAL LOW (ref 8.9–10.3)
Chloride: 109 mmol/L (ref 98–111)
Chloride: 110 mmol/L (ref 98–111)
Creatinine, Ser: 1.1 mg/dL (ref 0.61–1.24)
Creatinine, Ser: 1.08 mg/dL (ref 0.61–1.24)
GFR, Estimated: >60 mL/min (ref >60)
GFR, Estimated: >60 mL/min (ref >60)
Glucose, Bld: 181 mg/dL — ABNORMAL HIGH (ref 70–99)
Glucose, Bld: 202 mg/dL — ABNORMAL HIGH (ref 70–99)
Potassium: 4.3 mmol/L (ref 3.5–5.1)
Potassium: 4.6 mmol/L (ref 3.5–5.1)
Sodium: 150 mmol/L — ABNORMAL HIGH (ref 135–145)
Sodium: 151 mmol/L — ABNORMAL HIGH (ref 135–145)

## 2021-05-26 LAB — HEPATIC FUNCTION PANEL
ALT: 387 U/L — ABNORMAL HIGH (ref 0–44)
AST: 196 U/L — ABNORMAL HIGH (ref 15–41)
Albumin: 1.9 g/dL — ABNORMAL LOW (ref 3.5–5.0)
Alkaline Phosphatase: 189 U/L — ABNORMAL HIGH (ref 38–126)
Bilirubin, Direct: 1.1 mg/dL — ABNORMAL HIGH (ref 0.0–0.2)
Indirect Bilirubin: 0.8 mg/dL (ref 0.3–0.9)
Total Bilirubin: 1.9 mg/dL — ABNORMAL HIGH (ref 0.3–1.2)
Total Protein: 4 g/dL — ABNORMAL LOW (ref 6.5–8.1)

## 2021-05-26 LAB — PROTIME-INR
INR: 1.9 — ABNORMAL HIGH (ref 0.8–1.2)
Prothrombin Time: 22.1 seconds — ABNORMAL HIGH (ref 11.4–15.2)

## 2021-05-26 LAB — FIBRINOGEN: Fibrinogen: 379 mg/dL (ref 210–475)

## 2021-05-26 LAB — CBC
HCT: 30.2 % — ABNORMAL LOW (ref 39.0–52.0)
HCT: 36.3 % — ABNORMAL LOW (ref 39.0–52.0)
Hemoglobin: 9.5 g/dL — ABNORMAL LOW (ref 13.0–17.0)
Hemoglobin: 11 g/dL — ABNORMAL LOW (ref 13.0–17.0)
MCH: 28.9 pg (ref 26.0–34.0)
MCH: 28.2 pg (ref 26.0–34.0)
MCHC: 31.5 g/dL (ref 30.0–36.0)
MCHC: 30.3 g/dL (ref 30.0–36.0)
MCV: 91.8 fL (ref 80.0–100.0)
MCV: 93.1 fL (ref 80.0–100.0)
Platelets: 192 10*3/uL (ref 150–400)
Platelets: 232 10*3/uL (ref 150–400)
RBC: 3.29 MIL/uL — ABNORMAL LOW (ref 4.22–5.81)
RBC: 3.9 MIL/uL — ABNORMAL LOW (ref 4.22–5.81)
RDW: 16.1 % — ABNORMAL HIGH (ref 11.5–15.5)
RDW: 16.4 % — ABNORMAL HIGH (ref 11.5–15.5)
WBC: 11.9 10*3/uL — ABNORMAL HIGH (ref 4.0–10.5)
WBC: 15 10*3/uL — ABNORMAL HIGH (ref 4.0–10.5)
nRBC: 0 % (ref 0.0–0.2)
nRBC: 0 % (ref 0.0–0.2)

## 2021-05-26 LAB — GLUCOSE, CAPILLARY
Glucose-Capillary: 156 mg/dL — ABNORMAL HIGH (ref 70–99)
Glucose-Capillary: 162 mg/dL — ABNORMAL HIGH (ref 70–99)
Glucose-Capillary: 134 mg/dL — ABNORMAL HIGH (ref 70–99)
Glucose-Capillary: 188 mg/dL — ABNORMAL HIGH (ref 70–99)
Glucose-Capillary: 156 mg/dL — ABNORMAL HIGH (ref 70–99)

## 2021-05-26 LAB — LACTATE DEHYDROGENASE: LDH: 354 U/L — ABNORMAL HIGH (ref 98–192)

## 2021-05-26 LAB — APTT
aPTT: 53 seconds — ABNORMAL HIGH (ref 24–36)
aPTT: 41 seconds — ABNORMAL HIGH (ref 24–36)
aPTT: 34 seconds (ref 24–36)

## 2021-05-26 LAB — MAGNESIUM: Magnesium: 2.2 mg/dL (ref 1.7–2.4)

## 2021-05-26 LAB — LACTIC ACID, PLASMA: Lactic Acid, Venous: 1.3 mmol/L (ref 0.5–1.9)

## 2021-05-26 LAB — SEDIMENTATION RATE: Sed Rate: 22 mm/hr — ABNORMAL HIGH (ref 0–16)

## 2021-05-26 MED ORDER — AMLODIPINE BESYLATE 10 MG PO TABS
10.0000 mg | ORAL_TABLET | Freq: Every day | ORAL | Status: DC
  Administered 2021-05-26 – 2021-06-01 (×7): 10 mg via NASOGASTRIC
  Filled 2021-05-26 (×7): qty 1

## 2021-05-26 MED ORDER — HYDRALAZINE HCL 25 MG PO TABS
25.0000 mg | ORAL_TABLET | Freq: Three times a day (TID) | ORAL | Status: DC
  Administered 2021-05-27 – 2021-05-31 (×14): 25 mg
  Filled 2021-05-26 (×14): qty 1

## 2021-05-26 MED ORDER — FREE WATER
300.0000 mL | Freq: Four times a day (QID) | Status: DC
  Administered 2021-05-26 – 2021-05-27 (×4): 300 mL

## 2021-05-26 MED ORDER — ETOMIDATE 2 MG/ML IV SOLN
INTRAVENOUS | Status: AC
  Administered 2021-05-26: 20 mg via INTRAVENOUS
  Filled 2021-05-26: qty 10

## 2021-05-26 MED ORDER — NALOXONE HCL 0.4 MG/ML IJ SOLN
INTRAMUSCULAR | Status: AC
  Administered 2021-05-26: 0.4 mg
  Filled 2021-05-26: qty 1

## 2021-05-26 MED ORDER — MIDAZOLAM HCL 2 MG/2ML IJ SOLN
5.0000 mg | Freq: Once | INTRAMUSCULAR | Status: AC
  Administered 2021-05-26: 2 mg via INTRAVENOUS
  Filled 2021-05-26: qty 6

## 2021-05-26 MED ORDER — FENTANYL CITRATE PF 50 MCG/ML IJ SOSY: 200.0000 ug | PREFILLED_SYRINGE | Freq: Once | INTRAMUSCULAR | Status: DC

## 2021-05-26 MED ORDER — ACETAZOLAMIDE 250 MG PO TABS
250.0000 mg | ORAL_TABLET | Freq: Two times a day (BID) | ORAL | Status: DC
  Administered 2021-05-26 – 2021-05-29 (×8): 250 mg
  Filled 2021-05-26 (×9): qty 1

## 2021-05-26 MED ORDER — SODIUM CHLORIDE 0.9 % IV SOLN
0.0750 mg/kg/h | INTRAVENOUS | Status: DC
  Administered 2021-05-27: 22:00:00 0.051 mg/kg/h via INTRAVENOUS
  Administered 2021-05-27: 08:00:00 0.046 mg/kg/h via INTRAVENOUS
  Administered 2021-05-30: 0.063 mg/kg/h via INTRAVENOUS
  Administered 2021-05-31 – 2021-06-08 (×5): 0.065 mg/kg/h via INTRAVENOUS
  Administered 2021-06-10: 0.085 mg/kg/h via INTRAVENOUS
  Administered 2021-06-12 – 2021-06-16 (×4): 0.075 mg/kg/h via INTRAVENOUS
  Filled 2021-05-26 (×14): qty 250

## 2021-05-26 MED ORDER — ETOMIDATE 2 MG/ML IV SOLN
40.0000 mg | Freq: Once | INTRAVENOUS | Status: AC
  Filled 2021-05-26: qty 20

## 2021-05-26 MED ORDER — VECURONIUM BROMIDE 10 MG IV SOLR
10.0000 mg | Freq: Once | INTRAVENOUS | Status: AC
  Administered 2021-05-26: 10 mg via INTRAVENOUS
  Filled 2021-05-26: qty 10

## 2021-05-26 MED ORDER — HYDRALAZINE HCL 25 MG PO TABS
25.0000 mg | ORAL_TABLET | Freq: Three times a day (TID) | ORAL | Status: DC
  Administered 2021-05-26: 25 mg via ORAL
  Filled 2021-05-26: qty 1

## 2021-05-26 MED ORDER — LIDOCAINE-EPINEPHRINE 1 %-1:100000 IJ SOLN
20.0000 mL | Freq: Once | INTRAMUSCULAR | Status: AC
  Administered 2021-05-26: 20 mL via INTRADERMAL
  Filled 2021-05-26: qty 1

## 2021-05-26 NOTE — Progress Notes (Addendum)
ANTICOAGULATION CONSULT NOTE -  Pharmacy Consult for Bivalirudin Indication:  ECMO  Allergies  Allergen Reactions   Amoxicillin-Pot Clavulanate     Per pt report on 08/19/20, makes his urine dark colored   Atorvastatin Other (See Comments)     ( pt states causing runny nose, headaches, issue w/ urination)   Plant Derived Enzymes     Other reaction(s): Unknown   Trichophyton Other (See Comments)    Patient Measurements: Height: 5\' 10"  (177.8 cm) Weight: 83.5 kg (184 lb 1.4 oz) IBW/kg (Calculated) : 73  Vital Signs: Temp: 98.1 F (36.7 C) (02/07 1600) Temp Source: Core (02/07 1600) BP: 122/58 (02/07 1700) Pulse Rate: 92 (02/07 1800)  Labs: Recent Labs    05/24/21 0329 05/24/21 0330 05/25/21 0344 05/25/21 0528 05/25/21 1535 05/25/21 1544 05/26/21 0328 05/26/21 0611 05/26/21 1245 05/26/21 1557 05/26/21 1603  HGB 7.4*   < > 9.6*   < > 9.9*   < > 9.5*   9.2*   < > 10.9* 11.0* 11.9*  HCT 23.6*   < > 29.7*   < > 30.8*   < > 30.2*   27.0*   < > 32.0* 36.3* 35.0*  PLT 165   < > 195  --  202  --  192  --   --  232  --   APTT 55*   < > 49*  --  47*  --  53*  --   --  41*  --   LABPROT 22.3*  --  22.3*  --   --   --  22.1*  --   --   --   --   INR 2.0*  --  2.0*  --   --   --  1.9*  --   --   --   --   CREATININE 1.34*   < > 1.17  --  1.17  --  1.10  --   --   --   --    < > = values in this interval not displayed.     Estimated Creatinine Clearance: 63.6 mL/min (by C-G formula based on SCr of 1.1 mg/dL).   Medical History: Past Medical History:  Diagnosis Date   Allergic rhinitis    Prostate cancer ([place])     Medications:  Infusions:   sodium chloride Stopped (05/26/21 0138)   albumin human     amiodarone 30 mg/hr (05/26/21 1800)   bivalirudin (ANGIOMAX) infusion 0.5 mg/mL (Non-ACS indications) 0.038 mg/kg/hr (05/26/21 1800)   dexmedetomidine (PRECEDEX) IV infusion Stopped (05/26/21 1307)   feeding supplement (VITAL 1.5 CAL) 1,000 mL (05/26/21 1606)   fentaNYL  infusion INTRAVENOUS 125 mcg/hr (05/26/21 1800)   meropenem (MERREM) IV 200 mL/hr at 05/26/21 1800    Assessment: 71 year old male with coronary artery disease who initially presented with chest pain, noted to have multivessel coronary artery disease, he underwent CABG x3 on 05/15/2021, course was complicated with atrial fibrillation, frequent hiccups and aspiration pneumonia leading to hypoxia and increasing oxygen requirement. Patient now requiring VV ECMO. Pharmacy consulted for bivalirudin IV.   aPTT previously at goal on bivalirudin drip rate 0.038mg /kg/hr, held this morning for trach placement and resumed at previous rate ~1300 per discussion with RN. Level this afternoon drawn slightly early but resulted a low at 41. Hgb stable 11.9, plt WNL, LDH stable 300s. Fibrinogen stable 300-400. No issues with infusion per RN but does note some bleeding from trach site (not significant amount but will continue to monitor).  Goal of Therapy:  aPTT 50-80 seconds Monitor platelets by anticoagulation protocol: Yes   Plan:  Continue bivalirudin at 0.038 mg/kg/hr for now, as level drawn slightly early after resumption this afternoon, patient previously therapeutic on this rate, and noted trach site bleeding Recheck aPTT at 1900; then resume aptt and CBC q12 hour checks Monitor closely for increased s/sx bleeding   Arturo Morton, PharmD, BCPS Please check AMION for all [place] contact numbers Clinical Pharmacist 05/26/2021 6:03 PM  ADDENDUM - aPTT recheck remains subtherapeutic and decreased at 34 on recheck. Still minimal trach bleeding from site but no change per RN. No issues with infusion reported.  Plan: Increase bivalirudin to 0.046 mg/kg/hr Check 4hr aPTT Monitor q12h CBC, s/sx bleeding   Arturo Morton, PharmD, BCPS Please check AMION for all [facility] contact numbers Clinical Pharmacist 05/26/2021 9:30 PM

## 2021-05-26 NOTE — Progress Notes (Signed)
Occupational Therapy Treatment Patient Details Name: Randall Hodges MRN: [id number] DOB: Aug 14, 1950 Today's Date: 05/26/2021   History of present illness 71 yo male with CAD who initially presented with CP, noted to have multivessel CAD, he underwent CABG x3 on 05/01/2021, course was complicated with atrial fibrillation, frequent hiccups and aspiration PNA leading to hypoxia and increasing oxygen requirement. ETT 1/18-1/18. 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 Cortrak 1/30. 2/2 - VB ECMO initated 2/7 trach pMH prostate CA   OT comments  Pt tilt 30 degrees kreg bed with increase arousal after narcan provided by RN staff. Pt following simple commands ( stick out tongue, squeeze hand, open eyes wide). Recommendation for CIR with progression of care.   Recommendations for follow up therapy are one component of a multi-disciplinary discharge planning process, led by the attending physician.  Recommendations may be updated based on patient status, additional functional criteria and insurance authorization.    Follow Up Recommendations  Acute inpatient rehab (3hours/day)    Assistance Recommended at Discharge Frequent or constant Supervision/Assistance  Patient can return home with the following  A lot of help with walking and/or transfers   Equipment Recommendations  Other (comment)    Recommendations for Other Services      Precautions / Restrictions Precautions Precautions: Fall;Sternal Precaution Comments: ECMO, cortrak, foley, aline, sternal precautions, flexiseal Restrictions Other Position/Activity Restrictions: sternal precautions       Mobility Bed Mobility Overal bed mobility: Needs Assistance             General bed mobility comments: kreg bed tilt to 30 degrees no straps tolerates [id number]    Transfers                         Balance                                           ADL either performed or assessed with  clinical judgement   ADL Overall ADL's : Needs assistance/impaired     Grooming: Oral care;Total assistance Grooming Details (indicate cue type and reason): Rn doing oral care due to patient moving mouth                               General ADL Comments: total (A) for all adls at this time    Extremity/Trunk Assessment Upper Extremity Assessment Upper Extremity Assessment: Generalized weakness;RUE deficits/detail;LUE deficits/detail RUE Deficits / Details: shoulder shrug hand squeeze without request but does not complete shoulder flexion on command LUE Deficits / Details: shoulder shrug hand squeeze without request but does not complete shoulder flexion on command   Lower Extremity Assessment Lower Extremity Assessment: Defer to PT evaluation (activation of knee extension with tactile input)        Vision   Additional Comments: to be further assessed. pt static stare   Perception     Praxis      Cognition Arousal/Alertness: Awake/alert Behavior During Therapy: Flat affect Overall Cognitive Status: Difficult to assess                                 General Comments: following command to stick out tongue, thumbs up, opening eyes.        Exercises Exercises: Other  exercises Other Exercises Other Exercises: move digits, no activation of shoulder flexion, PROM R UE 90 degrees,knee extension Other Exercises: pt tolerated tilt 30 degrees this session    Shoulder Instructions       General Comments vent full 100% support peep 10 Sweep 5, HR max 131 BP aline 226/107 pt given narcan during session and then fentayl resumed to help with pain management    Pertinent Vitals/ Pain       Pain Assessment Pain Assessment: Faces Faces Pain Scale: Hurts even more Pain Descriptors / Indicators: Grimacing Pain Intervention(s): Monitored during session, Repositioned (nodded "yes" when asked in pain)  Home Living                                           Prior Functioning/Environment              Frequency  Min 2X/week        Progress Toward Goals  OT Goals(current goals can now be found in the care plan section)  Progress towards OT goals: Progressing toward goals  Acute Rehab OT Goals Patient Stated Goal: na OT Goal Formulation: Patient unable to participate in goal setting Time For Goal Achievement: 06/05/21 Potential to Achieve Goals: Good ADL Goals Pt Will Perform Grooming: with mod assist;sitting Pt Will Transfer to Toilet: with +2 assist;with max assist;stand pivot transfer;bedside commode Additional ADL Goal #1: pt will follow 1 step commands 50% of session Additional ADL Goal #2: pt will tolerate eob dangle 3 minutes with stable VSS with mod Support as precursor to adls  Plan Discharge plan remains appropriate    Co-evaluation    PT/OT/SLP Co-Evaluation/Treatment: Yes Reason for Co-Treatment: Complexity of the patient's impairments (multi-system involvement);For patient/therapist safety   OT goals addressed during session: ADL's and self-care;Proper use of Adaptive equipment and DME;Strengthening/ROM      AM-PAC OT "6 Clicks" Daily Activity     Outcome Measure   Help from another person eating meals?: Total Help from another person taking care of personal grooming?: Total Help from another person toileting, which includes using toliet, bedpan, or urinal?: Total Help from another person bathing (including washing, rinsing, drying)?: Total Help from another person to put on and taking off regular upper body clothing?: Total Help from another person to put on and taking off regular lower body clothing?: Total 6 Click Score: 6    End of Session Equipment Utilized During Treatment: Oxygen  OT Visit Diagnosis: Muscle weakness (generalized) (M62.81)   Activity Tolerance     Patient Left in bed;with call bell/phone within reach;with nursing/sitter in room;Other (comment) (ECMo team  present x2 assisting)   Nurse Communication Mobility status;Precautions        Time: 7948-0165 OT Time Calculation (min): 27 min  Charges: OT General Charges $OT Visit: 1 Visit OT Treatments $Therapeutic Activity: 8-22 mins   Randall Hodges, OTR/L  Acute Rehabilitation Services Pager: [phone] Office: [phone] .   Jeri Modena 05/26/2021, 3:52 PM

## 2021-05-26 NOTE — Progress Notes (Signed)
Pharmacy Antibiotic Note  Randall Hodges is a 71 y.o. male admitted on 04/25/2021 with pneumonia, ARDS.  Pharmacy has been consulted for meropenem.  Due to worsening status 06/09/2021 with rising WBC and persistent infiltrates on CXR, pharmacy asked to broaden to vancomycin + meropenem. Vancomycin stopped on 2/5 after 6 days of treatment and MRSA PCR was negative.   Patient is on VV-ECMO for PNA. WBCs have remained stable at 11-12 and patient remains afebrile. Patient is now off vasopressin and on intermittent NE for low MAPs. Planning for trach today. Meropenem coursed out for 7 days.   2/2 BAL: ngtd, PJP negative 2/2 resp panel: neg 1/31 ESR 55 2/1 covid- negative 2/1 MRSA PCR- negative 2/1 flu negative  Plan: Meropenem 1g IV q 8 hrs  Height: _0  (177.8 cm) Weight: 83.5 kg (184 lb 1.4 oz) IBW/kg (Calculated) : 73  Temp (24hrs), Avg:98.3 F (36.8 C), Min:98.1 F (36.7 C), Max:98.6 F (37 C)  Recent Labs  Lab 05/23/21 0115 05/23/21 0320 05/23/21 1000 05/23/21 1605 05/24/21 0329 05/24/21 0542 05/24/21 1613 05/24/21 1702 05/25/21 0344 05/25/21 1535 05/26/21 0328 05/26/21 0539  WBC  --  12.1*  --    < > 9.4  --  11.1*  --  12.9* 11.8* 11.9*  --   CREATININE  --  1.30*  --    < > 1.34*  --  1.25*  --  1.17 1.17 1.10  --   LATICACIDVEN  --  1.2  --   --   --  1.3  --  1.6 1.3  --   --  1.3  VANCOTROUGH  --   --  18  --   --   --   --   --   --   --   --   --   VANCOPEAK 35  --   --   --   --   --   --   --   --   --   --   --    < > = values in this interval not displayed.     Estimated Creatinine Clearance: 63.6 mL/min (by C-G formula based on SCr of 1.1 mg/dL).    Allergies  Allergen Reactions   Amoxicillin-Pot Clavulanate     Per pt report on 08/19/20, makes his urine dark colored   Atorvastatin Other (See Comments)     ( pt states causing runny nose, headaches, issue w/ urination)   Plant Derived Enzymes     Other reaction(s): Unknown   Trichophyton Other (See  Comments)    Antimicrobials this admission: Cefepime 1/29 >> 1/30 Zosyn 1/30 >>2/2 Meropenem 2/2> Vanc 1/31 >> 2/5  Dose adjustments this admission: 2/4 Vancomycin 750 mg q12h >> 500 q12h based on VT/VP 0100 VP = 35 _1  (dose _2 ) 1000 VT= 18 @ 1000 Calculated with this dose= 663  Microbiology results: 25: MRSA PCR neg 2/2 BAL: PJP smear: negative 2/2 BAL: ngtd 2/2 resp panel: neg 2/1 covid- negative 2/1 MRSA PCR- negative 2/1 flu negative 1/31 ESR 55  Cathrine Muster, PharmD PGY2 Cardiology Pharmacy Resident Phone: [phone] 05/26/2021  12:39 PM  Please check [email or web address] for unit-specific pharmacy phone numbers.

## 2021-05-26 NOTE — Progress Notes (Signed)
Patient ID: Randall Hodges, male   DOB: [birth year], 71 y.o.   MRN: [id number]   Progress Note  Patient Name: Randall Hodges Date of Encounter: 05/26/2021  [facility] Cardiologist: [name], MD   Subjective   2/2: VV ECMO initiation, Crescent catheter right IJ  Remains on vent. On precedex for sedation. Will wake up and follow commands. CVP 6 with mildly negative I/Os, weight down 1 lb. Received lasix 60 IV x 1 yesterday. Co-ox 76%.  Off vasopressin and NE  He is in NSR on amiodarone gtt.  Bivalirudin gtt for ECMO circuit. No bleeding  LDH 615 -> 380 -> 428 -> 391 -> 354  Has CorTrak tube feeds.   Vent FiO2 0.5. CXR still with bilateral diffuse airspace disease. ECMO cannula is positional, get recirculation in certain positions but can be corrected.   ECMO: Speed 2900 rpm Flow 3.6 L/min pVen -54 DeltaP 16 PTT 53 (goal 50-80) LDH 354 Lactate 1.3 ABG 7.39/61/127/99%  Cardiac Studies: Echo (limited, 1/30): Echo reviewed, there is clear respirophasic variation of the interventricular septum and marked respirophasic variation of E inflow velocity on doppler evaluation of the mitral valve.  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  LV EF 45-50%.   RHC Procedural Findings (on norepinephrine 6): Hemodynamics (mmHg) RA mean 12 RV 37/12 PA 38/16, mean 27 PCWP mean 11 LV 108/12 AO 96/55 PAPI 1.8 Oxygen saturations: PA 54% AO 94% Cardiac Output (Fick) 5.59  Cardiac Index (Fick) 2.81  PVR 2.8 WU Simultaneous RV/LV tracings were obtained.  Difficult to interpret due to atrial fibrillation.  There was some suggestion of discordance (ventricular interdependence) but not clear.  Inpatient Medications    Scheduled Meds:  sodium chloride   Intravenous Once   acetaZOLAMIDE  250 mg Per  Tube BID   aspirin  81 mg Per Tube Daily   chlorhexidine gluconate (MEDLINE KIT)  15 mL Mouth Rinse BID   Chlorhexidine Gluconate Cloth  6 each Topical Daily   colchicine  0.6 mg Per Tube Daily   docusate  100 mg Per Tube BID   feeding supplement (PROSource TF)  45 mL Per Tube TID   free water  300 mL Per Tube Q6H   guaiFENesin  30 mL Per Tube QID   insulin aspart  0-15 Units Subcutaneous Q4H   mouth rinse  15 mL Mouth Rinse 10 times per day   methylPREDNISolone (SOLU-MEDROL) injection  40 mg Intravenous Q12H   midodrine  5 mg Per NG tube TID WC   pantoprazole (PROTONIX) IV  40 mg Intravenous QHS   polyethylene glycol  17 g Per Tube Daily   QUEtiapine  25 mg Per Tube BID   rosuvastatin  10 mg Per Tube Daily   sodium chloride flush  10-40 mL Intracatheter Q12H   sodium chloride flush  3 mL Intravenous Q12H   Continuous Infusions:  sodium chloride Stopped (05/26/21 0138)   albumin human     amiodarone 30 mg/hr (05/26/21 0700)   bivalirudin (ANGIOMAX) infusion 0.5 mg/mL (Non-ACS indications) 0.038 mg/kg/hr (05/26/21 0700)   dexmedetomidine (PRECEDEX) IV infusion 0.5 mcg/kg/hr (05/26/21 0700)   feeding supplement (VITAL 1.5 CAL) 1,000 mL (05/25/21 2354)   fentaNYL infusion INTRAVENOUS 200 mcg/hr (05/26/21 0700)   meropenem (MERREM) IV Stopped (05/26/21 0208)   PRN Meds: Place/Maintain arterial line **AND** sodium chloride, acetaminophen (TYLENOL) oral liquid 160  mg/5 mL, albumin human, diazepam, fentaNYL (SUBLIMAZE) injection, fentaNYL (SUBLIMAZE) injection, hydrOXYzine, levalbuterol, ondansetron (ZOFRAN) IV, polyvinyl alcohol, sodium chloride flush   Vital Signs    Vitals:   05/26/21 0630 05/26/21 0645 05/26/21 0700 05/26/21 0733  BP:   109/60   Pulse: 77 73 79   Resp: _0 (!) 25  Temp:      TempSrc:      SpO2: 99% 100% 99% 99%  Weight:      Height:        Intake/Output Summary (Last 24 hours) at 05/26/2021 0739 Last data filed at 05/26/2021 0700 Gross per 24 hour   Intake 3970.94 ml  Output 4475 ml  Net -504.06 ml   Last 3 Weights 05/26/2021 05/25/2021 05/24/2021  Weight (lbs) 184 lb 1.4 oz 185 lb 6.5 oz 183 lb 10.3 oz  Weight (kg) 83.5 kg 84.1 kg 83.3 kg      Telemetry    NSR 60s-70s (personally reviewed)  Physical Exam   General: NAD, RIJ Crescent catheter Neck: No JVD, no thyromegaly or thyroid nodule.  Lungs: Crackles at bases.  CV: Nondisplaced PMI.  Heart regular S1/S2, no S3/S4, no murmur.  1+ edema 1/2 to knees bilaterally Abdomen: Soft, nontender, no hepatosplenomegaly, no distention.  Skin: Intact without lesions or rashes.  Neurologic: Will awaken and follow commands.  Extremities: No clubbing or cyanosis.  HEENT: Normal.    Labs    High Sensitivity Troponin:   Recent Labs  Lab 05/19/2021 1321 04/24/2021 1518  TROPONINIHS 3 4     Chemistry Recent Labs  Lab 05/24/21 0329 05/24/21 0330 05/25/21 0344 05/25/21 0528 05/25/21 1535 05/25/21 1544 05/25/21 2326 05/26/21 0328 05/26/21 0611  NA 142   < > 147*   < > 151*   < > 150* 150*   150* 151*  K 4.2   < > 4.1   < > 4.1   < > 4.2 4.3   4.2 4.3  CL 100   < > 105  --  107  --   --  109  --   CO2 33*   < > 35*  --  35*  --   --  35*  --   GLUCOSE 195*   < > 173*  --  177*  --   --  181*  --   BUN 61*   < > 58*  --  59*  --   --  56*  --   CREATININE 1.34*   < > 1.17  --  1.17  --   --  1.10  --   CALCIUM 7.7*   < > 7.8*  --  7.9*  --   --  7.8*  --   MG 2.2  --  2.1  --   --   --   --  2.2  --   PROT 4.3*  --  4.4*  --   --   --   --  4.0*  --   ALBUMIN 2.0*  --  2.0*  --   --   --   --  1.9*  --   AST 134*  --  165*  --   --   --   --  196*  --   ALT 255*  --  274*  --   --   --   --  387*  --   ALKPHOS 121  --  155*  --   --   --   --  189*  --   BILITOT 1.1  --  0.7  --   --   --   --  1.9*  --   GFRNONAA 57*   < > >60  --  >60  --   --  >60  --   ANIONGAP 9   < > 7  --  9  --   --  6  --    < > = values in this interval not displayed.    Lipids  No results for  input(s): CHOL, TRIG, HDL, LABVLDL, LDLCALC, CHOLHDL in the last 168 hours.   Hematology Recent Labs  Lab 05/25/21 0344 05/25/21 0528 05/25/21 1535 05/25/21 1544 05/25/21 2326 05/26/21 0328 05/26/21 0611  WBC 12.9*  --  11.8*  --   --  11.9*  --   RBC 3.32*  --  3.41*  --   --  3.29*  --   HGB 9.6*   < > 9.9*   < > 9.5* 9.5*   9.2* 9.9*  HCT 29.7*   < > 30.8*   < > 28.0* 30.2*   27.0* 29.0*  MCV 89.5  --  90.3  --   --  91.8  --   MCH 28.9  --  29.0  --   --  28.9  --   MCHC 32.3  --  32.1  --   --  31.5  --   RDW 16.2*  --  16.1*  --   --  16.1*  --   PLT 195  --  202  --   --  192  --    < > = values in this interval not displayed.   Thyroid No results for input(s): TSH, FREET4 in the last 168 hours.  BNPNo results for input(s): BNP, PROBNP in the last 168 hours.  DDimer No results for input(s): DDIMER in the last 168 hours.   Radiology    DG CHEST PORT 1 VIEW  Result Date: 05/25/2021 CLINICAL DATA:  ECMO EXAM: PORTABLE CHEST 1 VIEW COMPARISON:  Radiograph dated May 24, 2021 FINDINGS: Unchanged position of ET tube left subclavian line, feeding tube, and visualized ECMO cannula. Cardiac and mediastinal contours are unchanged. Diffuse heterogeneous pulmonary opacities. Probable trace bilateral pleural effusions. No evidence of pneumothorax. IMPRESSION: Stable chest x-ray. Electronically Signed   By: Yetta Glassman M.D.   On: 05/25/2021 08:13   DG CHEST PORT 1 VIEW  Result Date: 05/24/2021 CLINICAL DATA:  Acute respiratory failure.  Ventilator support. EXAM: PORTABLE CHEST 1 VIEW COMPARISON:  05/24/2021 earlier same day FINDINGS: Endotracheal tube tip 3 cm above the carina. Left subclavian central line tip in the SVC above the right atrium. ECMO device overlies the region, unchanged. Soft feeding tube enters the abdomen. Widespread pulmonary edema persists but is slightly diminished. No worsening or new finding. Acute fracture of the right first and second ribs as seen  previously. IMPRESSION: Endotracheal tube advanced slightly, tip now 3 cm above the carina. Slightly improved/diminished pulmonary edema. No other change. Electronically Signed   By: Nelson Chimes M.D.   On: 05/24/2021 17:20    Cardiac Studies   Cath 05/01/2021 Distal left main [place] 111 bifurcation stenosis with 75% left main, 90% ostial to proximal LAD, and 80-90% ostial circumflex (difficult to assess due to heavy calcification). Severe mid circumflex disease with 70% eccentric mid stenosis and second obtuse marginal containing ostial to proximal greater than 80% stenosis.  (Bifurcation [place] 111 Severe calcification in left main  and LAD in particular with diffuse 50% narrowing from proximal to mid vessel and tandem 70% stenoses in the mid LAD. Nondominant right coronary Normal LV function.  EF 55%.  LVEDP normal.    Patient Profile     71 y.o. male with PMH of PVCs presented with chest pain. Cardiac cath by Dr. Tamala Julian on 05/01/2021 showed 75% left main, 90% ost to prox LAD, 80-90% ost LCx, 70% mid LCx, 80% OM2, 50% prox to mid LAD, 70% mid LAD, EF 55%. Patient underwent CABG x 3 on 05/16/2021. Post op course complicated afib, treated with amio. CXR showed opacity in bilateral lung, started abx on 1/25 and diuretic. Started on Eliquis due to recurrence of afib.    Assessment & Plan    1. CAD: Admitted with unstable angina, cath with severe left main and proximal LAD/LCx disease (nondominant RCA).  CABG x 3 on 1/18 with LIMA-LAD, SVG-OM, SVG-left PDA.  No s/s angina  - Continue ASA 81, statin.  2. 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 RHC 1/30 with equalization of diastolic pressures.  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  Repeated echo 2/2 still showed respirophasic septal variation but not as impressive. CVP 6 today with I/Os slightly negative, co-ox 76%.  - Acetazolamide 250 mg bid today, keep even to mildly negative.  - With concern for development of post-surgical effusive/constrictive pericarditis, he is on colchicine.  - ?need for surgical intervention on effusive/constrictive pericarditis. Will need improvement of lung disease first then reassess, repeat echo not as impressive.   3. 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  Suspect primarily septic/vasodilatory shock. He is currently off vasopressin and NE with stable MAP. Lactate normal.  - Continue midodrine 5 mg tid.   4. Hiccups: Had been intractable, now seem resolved.  5. PNA: Of note, he does appear to have had some pre-existing ILD from 2019 CT chest (?sarcoidosis). CXR with persistent bilateral infiltrates, possible mild improvement compared to yesterday.  Have thought most likely aspiration PNA/pneumonitis in setting of intractable hiccups. He has developred ARDS.  Afebrile. COVID was negative.  FiO2 0.5 on vent.  Sweep down to 3. CXR similar with bilateral diffuse airspace disease.  - Repeat ABG at lower sweep.  - Discussed with pulmonary, think amiodarone toxicity unlikely as he has only been on it post-op.  - Completed vancomycin, remains on meropenem.  - Vent per CCM. May be approaching tracheostomy.  - Solumedrol 40 mg IV bid.  - ECMO cannula is positional, last ABG was good.  Will watch for now and try to externally reposition.  Could probably pull back cannula a bit but with good ABG currently I am reticent just yet.  6. Atypical atrial flutter/PVCs:  Remains in NSR - Continue amiodarone 30 mg/hr. - Bivalirudin gtt, goal PTT 50-80.  Discussed  dosing with PharmD personally. 7. AKI: Creatinine 1.87 => 1.69 => 1.3 => 1.34 => 1.17 => 1.10.  Follow closely.  8. Elevated LFTs: Suspect shock liver, follow CMET.  LFTs trending down.  9. Anemia: Stable hgb at 9.5. 10. Hypernatremia: Increase free water to 300 cc q6 hrs.   CRITICAL CARE Performed by: Loralie Champagne  Total critical care time: 40 minutes  Critical care time was exclusive of separately billable procedures and treating other patients.  Critical care was necessary to treat or prevent imminent or life-threatening deterioration.  Critical care was time spent personally by me on the following activities: development of treatment plan with patient and/or surrogate as well as nursing, discussions with consultants, evaluation of patient's response to treatment, examination of patient, obtaining history from patient or surrogate, ordering and performing treatments and interventions, ordering and review of laboratory studies, ordering and review of radiographic studies, pulse oximetry and re-evaluation of patient's condition.  Loralie Champagne MD 05/26/2021 7:39 AM

## 2021-05-26 NOTE — Progress Notes (Signed)
NAME:  Randall Hodges, MRN:  [id number], DOB:  1950/05/22, LOS: 54 ADMISSION DATE:  04/26/2021, CONSULTATION DATE: 04/22/2021 REFERRING MD:  Dahlia Byes, MD, CHIEF COMPLAINT: Increasing shortness of breath  History of Present Illness:  71 year old male with coronary artery disease who initially presented with chest pain, noted to have multivessel coronary artery disease, he underwent CABG x3 on 04/1818 23, course was complicated with atrial fibrillation, frequent hiccups and aspiration pneumonia leading to hypoxia and increasing oxygen requirement. PCCM was consulted for evaluation and help with management    Pertinent  Medical History   Past Medical History:  Diagnosis Date   Allergic rhinitis    Prostate cancer ([place])     Significant Hospital Events: Including procedures, antibiotic start and stop dates in addition to other pertinent events   1/18 CABG 1/30 PCCM consult, Worsening O2 needs, likely aspiration, new pressor need 2/2 intubation, placed on VV ECMO 2/5 issues with recirc, bronch neg, improved with supine positioning  Interim History / Subjective:  CVP down, diuresed well. Clot at oxygenator preventing pre and post evaluation Remains afebrile  Objective   Blood pressure 109/60, pulse 79, temperature 98.4 F (36.9 C), temperature source Core, resp. rate 18, height 5\' 10"  (1.778 m), weight 83.5 kg, SpO2 99 %. CVP:  [5 mmHg-10 mmHg] 8 mmHg  Vent Mode: PCV FiO2 (%):  [50 %] 50 % Set Rate:  [20 bmp] 20 bmp PEEP:  [10 cmH20] 10 cmH20 Plateau Pressure:  [20 cmH20-24 cmH20] 23 cmH20   Intake/Output Summary (Last 24 hours) at 05/26/2021 0732 Last data filed at 05/26/2021 0700 Gross per 24 hour  Intake 3970.94 ml  Output 4475 ml  Net -504.06 ml    Filed Weights   05/24/21 0440 05/25/21 0500 05/26/21 0421  Weight: 83.3 kg 84.1 kg 83.5 kg    Examination: No distress, intubated, and sedated Lungs more clear TV 200 on PC 15/10 Circuit with clot at apex of  oxygenator Less anasarca  Current drips: fent, amio, precedex, bival, levophed CVP 6  Lab review Improved liver and kidney LDH 1400>>615>>380>>428>>391>>354 CBG ok CBC stable, H/H responded to transfusion Lactate flat and normal PTT 53 INR 1.9 Fibrinogen improved at 379 Coox 72%  CXR stable ARDS  Resolved Hospital Problem list   Hiccups postop  Assessment & Plan:  CAD with unstable angina s/p CABGx3 1/18 (LIMA-LAD, SVG-OM, SVG-left PDA) Postop recurrent shock question constrictive pericaritis type physiology vs. Septic Presumed aspiration pneumonitis vs. HCAP vs. ?AEILD culminating in severe ARDS with inability to ventilate along with high Aa gradient s/p VV ECMO cannulation 05/21/22.  CXR deteriorated between 1/26 and 1/29.   [facility] Atypical aflutter on amio Baseline fibrotic lung disease with preserved PFTs 2019, question of sarcoid- lost to f/u, see Dr. Matilde Bash note 08/15/17 AKI- multifactorial (sepsis, poor cardiac outflow, shock) improved -Increase frequency of free water flushes Bilateral effusions- post CABG, R>L on bedside US (2/3),  - Bedside ultrasound should right side improvement  - Diuresis, flow, and sweep wean per CHF team: may need to adjust cannula position today, will discuss - Usual hemolysis panel monitoring - Bival with PharmD aPTT monitoring and adjustment - Amio gtt for now - Switched back to PC 15, PRVC only allowed TV of 150 for DP <15 - Fent/Precedex titrated to RASS -1 - Continue meropenem x 7 days - Continue steroids for now; wean once starts to improve - given diamox for hypercapnia  - d/c pressors and reorder as needed - PT/OT,  TF as ordered - Considering tracheostomy for long term airway management  Best Practice (right click and "Reselect all SmartList Selections" daily)   Diet/type: Vital @ 71mL/hr DVT prophylaxis: Bival CBG: yes, SSI GI prophylaxis: PPI Lines: L [place] TLC, RIJ  Foley:  Yes, keep Code Status:  full  code Last date of multidisciplinary goals of care discussion [daily at bedside]  44 min cc time Erskine Emery MD PCCM

## 2021-05-26 NOTE — Procedures (Signed)
Diagnostic Bronchoscopy  Randall Hodges  [id number]  [birth year]  Date:05/26/21  Time:11:08 AM   Provider Performing:Pete E Kary Kos   Procedure: Diagnostic Bronchoscopy (17616)  Indication(s) Assist with direct visualization of tracheostomy placement  Consent Risks of the procedure as well as the alternatives and risks of each were explained to the patient and/or caregiver.  Consent for the procedure was obtained.   Anesthesia See separate tracheostomy note   Time Out Verified patient identification, verified procedure, site/side was marked, verified correct patient position, special equipment/implants available, medications/allergies/relevant history reviewed, required imaging and test results available.   Sterile Technique Usual hand hygiene, masks, gowns, and gloves were used   Procedure Description Bronchoscope advanced through endotracheal tube and into airway.  After suctioning out tracheal secretions, bronchoscope used to provide direct visualization of tracheostomy placement.   Complications/Tolerance None; patient tolerated the procedure well.   EBL None  Specimen(s) None  Randall Hodges ACNP-BC [place] Pager # [phone] OR # [phone] if no answer

## 2021-05-26 NOTE — Progress Notes (Signed)
Physical Therapy Treatment Patient Details Name: Randall Hodges MRN: [id number] DOB: March 12, 1951 Today's Date: 05/26/2021   History of Present Illness 71 yo male with CAD who initially presented with CP, noted to have multivessel CAD, he underwent CABG x3 on 05/15/2021, course was complicated with atrial fibrillation, frequent hiccups and aspiration PNA leading to hypoxia and increasing oxygen requirement. ETT 1/18-1/18. 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 Cortrak 1/30. 2/2 placed on VV ECMO. 2/7 trach. PMH: prostate CA    PT Comments    Pt transferred to Kreg tilt bed and given Narcan by RN to improve arousal for PT session. Pt tolerated 30 degree tilt for 17 minutes. Able to follow some 1 step commands I.e. stick out tongue, squeeze hand, open eyes wide. Will continue to progress mobility as tolerated pending level of arousal and tolerance.   Recommendations for follow up therapy are one component of a multi-disciplinary discharge planning process, led by the attending physician.  Recommendations may be updated based on patient status, additional functional criteria and insurance authorization.  Follow Up Recommendations  Acute inpatient rehab (3hours/day)     Assistance Recommended at Discharge Frequent or constant Supervision/Assistance  Patient can return home with the following A lot of help with walking and/or transfers   Equipment Recommendations  BSC/3in1;Wheelchair (measurements PT);Wheelchair cushion (measurements PT);Hospital bed;Other (comment) (hoyer lift)    Recommendations for Other Services       Precautions / Restrictions Precautions Precautions: Fall;Sternal Precaution Comments: ECMO, cortrak, foley, aline, sternal precautions, flexiseal, trach on vent Restrictions Weight Bearing Restrictions: No Other Position/Activity Restrictions: sternal precautions     Mobility  Bed Mobility Overal bed mobility: Needs Assistance             General  bed mobility comments: kreg bed tilt to 30 degrees no straps tolerates [id number]    Transfers                        Ambulation/Gait                   Stairs             Wheelchair Mobility    Modified Rankin (Stroke Patients Only)       Balance                                            Cognition Arousal/Alertness: Awake/alert Behavior During Therapy: Flat affect Overall Cognitive Status: Difficult to assess                                 General Comments: following command to stick out tongue, thumbs up, opening eyes.        Exercises General Exercises - Upper Extremity Shoulder Flexion: AAROM, Right, 10 reps, Supine Digit Composite Flexion: PROM, Right, 10 reps, Supine General Exercises - Lower Extremity Ankle Circles/Pumps: Both, 15 reps, PROM, Supine Heel Slides: PROM, Both, 10 reps, Supine Other Exercises Other Exercises: bilat knee extension in tilt x 5 Other Exercises: pt tolerated tilt 30 degrees this session    General Comments General comments (skin integrity, edema, etc.): SpO2 100% on 50% FiO2, 10 PEEP, HR 86-133 bpm, Auto BP: 129/71 (811), Aline: 226/108 (140). Sweep gas: 3      Pertinent Vitals/Pain Pain Assessment Pain Assessment: Faces  Faces Pain Scale: Hurts even more Pain Descriptors / Indicators: Grimacing Pain Intervention(s): Monitored during session    Home Living                          Prior Function            PT Goals (current goals can now be found in the care plan section) Acute Rehab PT Goals Patient Stated Goal: unable to state Potential to Achieve Goals: Fair    Frequency    Min 3X/week      PT Plan Current plan remains appropriate    Co-evaluation PT/OT/SLP Co-Evaluation/Treatment: Yes Reason for Co-Treatment: Complexity of the patient's impairments (multi-system involvement);For patient/therapist safety;To address functional/ADL  transfers PT goals addressed during session: Mobility/safety with mobility OT goals addressed during session: ADL's and self-care;Proper use of Adaptive equipment and DME;Strengthening/ROM      AM-PAC PT "6 Clicks" Mobility   Outcome Measure  Help needed turning from your back to your side while in a flat bed without using bedrails?: Total Help needed moving from lying on your back to sitting on the side of a flat bed without using bedrails?: Total Help needed moving to and from a bed to a chair (including a wheelchair)?: Total Help needed standing up from a chair using your arms (e.g., wheelchair or bedside chair)?: Total Help needed to walk in hospital room?: Total Help needed climbing 3-5 steps with a railing? : Total 6 Click Score: 6    End of Session Equipment Utilized During Treatment: Oxygen Activity Tolerance: Patient limited by lethargy Patient left: in bed;with call bell/phone within reach;with bed alarm set;with nursing/sitter in room Nurse Communication: Mobility status PT Visit Diagnosis: Muscle weakness (generalized) (M62.81);Other abnormalities of gait and mobility (R26.89);Difficulty in walking, not elsewhere classified (R26.2)     Time: 6283-1517 PT Time Calculation (min) (ACUTE ONLY): 43 min  Charges:  $Therapeutic Activity: 23-37 mins                     Wyona Almas, PT, DPT Acute Rehabilitation Services Pager [phone] Office [phone]    Deno Etienne 05/26/2021, 4:42 PM

## 2021-05-26 NOTE — Progress Notes (Signed)
OT Cancellation Note  Patient Details Name: Randall Hodges MRN: [id number] DOB: 10-Nov-1950   Cancelled Treatment:    Reason Eval/Treat Not Completed: Patient at procedure or test/ unavailable (trach)  Billey Chang, OTR/L  Acute Rehabilitation Services Pager: [phone] Office: [phone] .  05/26/2021, 8:50 AM

## 2021-05-26 NOTE — Progress Notes (Signed)
° °   °  [address]       [place] [address]             [phone]       Tracheostomy earlier today  BP 124/61 (BP Location: Left Arm)    Pulse 92    Temp 98.1 F (36.7 C) (Core)    Resp (!) 0    Ht 5\' 10"  (1.778 m)    Wt 83.5 kg    SpO2 100%    BMI 26.41 kg/m  ECMO with flow 3.6 L/min  Amiodarone @ 30  7.37/60/168/36   Intake/Output Summary (Last 24 hours) at 05/26/2021 1758 Last data filed at 05/26/2021 1600 Gross per 24 hour  Intake 3059.27 ml  Output 2450 ml  Net 609.27 ml   K= 4.6 Hct=36  Continue current care  Hiilei Gerst C. Roxan Hockey, MD [facility] [phone]

## 2021-05-26 NOTE — Consult Note (Signed)
Consultation Note Date: 05/26/2021   Patient Name: Randall Hodges  DOB: 1951/01/20  MRN: [id number]  Age / Sex: 71 y.o., male  PCP: [name], MD Referring Physician: Dahlia Byes, MD  Reason for Consultation: Establishing goals of care and Psychosocial/spiritual support  Initial consult placed on 06/14/2021,  ECMO was initiated, p.m. today provided space for family to process situation.   HPI/Patient Profile: 71 y.o. male   admitted on 04/22/2021 with a history of CAD who presented to the ED with chest pain who underwent heart catherization, found to have severe multivessel CAD with normal LV function. He underwent CABG x 3 on 1/18. He had recovered uneventfully but while continuing treatment on the floor he vomited and aspirated. Due to severe respiratory failure he was transferred to the ICU on 1/30. He was maintained on BiPAP, but due to progressive decline he was intubated on 2/2. He had high peak and plateau pressures with severe hypercapnia. Despite proning and heavy sedation his pH remained 7.06 with pCO2 remaining >110. ECMO consult called for hypercapnia and high vent pressures.  Patient currently intubated, and on VV ECMO  Clinical Assessment and Goals of Care:  This NP Wadie Lessen reviewed medical records, received report from team, assessed the patient and then meet with family ( wife and son) in waiting area to introduce the role of palliative medicine in this patient's complex medical situation.   Concept of Palliative Care was introduced as specialized medical care for people and their families living with serious illness.  If focuses on providing relief from the symptoms and stress of a serious illness.  The goal is to improve quality of life for both the patient and the family.   Values and goals of care important to patient and family were attempted to be elicited.  Created space and  opportunity for patient  and family to explore thoughts and feelings regarding current medical situation.  Family understands the seriousness of Randall Hodges current situation, and at this point they remain hopeful for improvement.      A  discussion was had today regarding advanced directives.  Patient's wife tells me that he does have a directive, I encouraged her to bring it in for scanning so we can get it into his electronic medical record.    She understands that her husband would not want excessive life prolonging measures if there was not a realistic outcome for meaningful recovery.    However she feels certain her husband would approve of the medical interventions in process currently.  Again family remain hopeful for improvement.  Any future decisions will be made on patient's outcomes.    Questions and concerns addressed. Family encouraged to call with questions or concerns.     PMT will continue to support holistically, and will engage with family intermittently and on request     SUMMARY OF RECOMMENDATIONS    -Family is open to all offered and available medical interventions to prolong life at this time. -Spiritual care consult to support family, consult  placed -PMT will continue to support holistically      Primary Diagnoses: Present on Admission:  Acute coronary syndrome ([place])   I have reviewed the medical record, interviewed the patient and family, and examined the patient. The following aspects are pertinent.  Past Medical History:  Diagnosis Date   Allergic rhinitis    Prostate cancer ([place])    Social History   Socioeconomic History   Marital status: Married    Spouse name: Not on file   Number of children: 2   Years of education: Not on file   Highest education level: Not on file  Occupational History    Comment: full time   Tobacco Use   Smoking status: Never   Smokeless tobacco: Never  Vaping Use   Vaping Use: Never used  Substance and Sexual  Activity   Alcohol use: Yes    Comment: occ   Drug use: Never   Sexual activity: Not on file  Other Topics Concern   Not on file  Social History Narrative   Not on file   Social Determinants of Health   Financial Resource Strain: Not on file  Food Insecurity: Not on file  Transportation Needs: Not on file  Physical Activity: Not on file  Stress: Not on file  Social Connections: Not on file   Family History  Problem Relation Age of Onset   Diabetes Father    Prostate cancer Father 50       had an enlarged prostate and had it removed   Hypertension Paternal Grandmother    Prostate cancer Brother 74       metastatic to bone   Breast cancer Neg Hx    Colon cancer Neg Hx    Pancreatic cancer Neg Hx    Scheduled Meds:  sodium chloride   Intravenous Once   acetaZOLAMIDE  250 mg Per Tube BID   aspirin  81 mg Per Tube Daily   chlorhexidine gluconate (MEDLINE KIT)  15 mL Mouth Rinse BID   Chlorhexidine Gluconate Cloth  6 each Topical Daily   colchicine  0.6 mg Per Tube Daily   docusate  100 mg Per Tube BID   feeding supplement (PROSource TF)  45 mL Per Tube TID   fentaNYL (SUBLIMAZE) injection  200 mcg Intravenous Once   free water  300 mL Per Tube Q6H   guaiFENesin  30 mL Per Tube QID   insulin aspart  0-15 Units Subcutaneous Q4H   mouth rinse  15 mL Mouth Rinse 10 times per day   methylPREDNISolone (SOLU-MEDROL) injection  40 mg Intravenous Q12H   midodrine  5 mg Per NG tube TID WC   pantoprazole (PROTONIX) IV  40 mg Intravenous QHS   polyethylene glycol  17 g Per Tube Daily   QUEtiapine  25 mg Per Tube BID   rosuvastatin  10 mg Per Tube Daily   sodium chloride flush  10-40 mL Intracatheter Q12H   sodium chloride flush  3 mL Intravenous Q12H   Continuous Infusions:  sodium chloride Stopped (05/26/21 0138)   albumin human     amiodarone 30 mg/hr (05/26/21 0900)   bivalirudin (ANGIOMAX) infusion 0.5 mg/mL (Non-ACS indications) Stopped (05/26/21 5625)   dexmedetomidine  (PRECEDEX) IV infusion 0.5 mcg/kg/hr (05/26/21 1041)   feeding supplement (VITAL 1.5 CAL) 1,000 mL (05/25/21 2354)   fentaNYL infusion INTRAVENOUS 200 mcg/hr (05/26/21 0900)   meropenem (MERREM) IV 1 g (05/26/21 1038)   PRN Meds:.Place/Maintain arterial line **AND** sodium chloride, acetaminophen (TYLENOL)  oral liquid 160 mg/5 mL, albumin human, diazepam, fentaNYL (SUBLIMAZE) injection, fentaNYL (SUBLIMAZE) injection, hydrOXYzine, levalbuterol, ondansetron (ZOFRAN) IV, polyvinyl alcohol, sodium chloride flush Medications Prior to Admission:  Prior to Admission medications   Medication Sig Start Date End Date Taking? Authorizing Provider  albuterol (VENTOLIN HFA) 108 (90 Base) MCG/ACT inhaler Inhale 1-2 puffs into the lungs every 4 (four) hours as needed for wheezing or shortness of breath. 04/07/21  Yes [provider]  aspirin EC 81 MG tablet Take 1 tablet (81 mg total) by mouth daily. Swallow whole. 07/18/20  Yes [name], MD  cetirizine (ZYRTEC) 10 MG tablet Take 10 mg by mouth daily.   Yes [provider]  Famotidine-Ca Carb-Mag Hydrox (PEPCID COMPLETE PO) Take 1 capsule by mouth daily.   Yes [provider]  metoprolol tartrate (LOPRESSOR) 100 MG tablet Take 1 tablet (100 mg total) by mouth 2 (two) times daily. 04/30/21 07/29/21 Yes Minus Breeding, MD  omeprazole (PRILOSEC) 40 MG capsule Take 40 mg by mouth daily as needed (heartburn/indigestion). 03/09/18   [provider]  rosuvastatin (CRESTOR) 10 MG tablet Take 1 tablet (10 mg total) by mouth daily. 12/11/20 06/27/21  Leonie Man, MD   Allergies  Allergen Reactions   Amoxicillin-Pot Clavulanate     Per pt report on 08/19/20, makes his urine dark colored   Atorvastatin Other (See Comments)     ( pt states causing runny nose, headaches, issue w/ urination)   Plant Derived Enzymes     Other reaction(s): Unknown   Trichophyton Other (See Comments)   Review of Systems  Unable to perform ROS:  Intubated   Physical Exam Constitutional:      Appearance: He is ill-appearing.     Interventions: He is intubated.  Cardiovascular:     Comments: ECMO Pulmonary:     Effort: He is intubated.  Skin:    General: Skin is warm and dry.    Vital Signs: BP 103/64    Pulse 73    Temp 98.4 F (36.9 C) (Core)    Resp 14    Ht '5\' 10"'  (1.778 m)    Wt 83.5 kg    SpO2 100%    BMI 26.41 kg/m  Pain Scale: CPOT POSS *See Group Information*: S-Acceptable,Sleep, easy to arouse Pain Score: Asleep   SpO2: SpO2: 100 % O2 Device:SpO2: 100 % O2 Flow Rate: .O2 Flow Rate (L/min): 15 L/min  IO: Intake/output summary:  Intake/Output Summary (Last 24 hours) at 05/26/2021 1220 Last data filed at 05/26/2021 1147 Gross per 24 hour  Intake 3154.82 ml  Output 4105 ml  Net -950.18 ml    LBM: Last BM Date: 05/26/21 Baseline Weight: Weight: 78.5 kg Most recent weight: Weight: 83.5 kg     Palliative Assessment/Data:    Time In: 1200 Time Out: 1250 Time Total: 50 minutes Greater than 50%  of this time was spent counseling and coordinating care related to the above assessment and plan.  Signed by: Wadie Lessen, NP   Please contact Palliative Medicine Team phone at [phone] for questions and concerns.  For individual provider: See Shea Evans

## 2021-05-26 NOTE — Plan of Care (Incomplete)
°  Problem: Clinical Measurements: Goal: Ability to maintain clinical measurements within normal limits will improve Outcome: Progressing Goal: Diagnostic test results will improve Outcome: Progressing   Problem: Nutrition: Goal: Adequate nutrition will be maintained Outcome: Progressing   Problem: Coping: Goal: Level of anxiety will decrease Outcome: Progressing   Problem: Elimination: Goal: Will not experience complications related to bowel motility Outcome: Progressing Goal: Will not experience complications related to urinary retention Outcome: Progressing

## 2021-05-26 NOTE — Procedures (Signed)
Percutaneous Tracheostomy Procedure Note   KEIRAN GAFFEY  [id number]  [birth year]  Date:05/26/21  Time:11:30 AM   Provider Performing:Student Dr. Billy Coast with Dr. Candee Furbish  Procedure: Percutaneous Tracheostomy with Bronchoscopic Guidance (06015)  Indication(s) Persistent resp failure  Consent Risks of the procedure as well as the alternatives and risks of each were explained to the patient and/or caregiver.  Consent for the procedure was obtained.  Anesthesia Etomidate, Versed, Fentanyl, Vecuronium   Time Out Verified patient identification, verified procedure, site/side was marked, verified correct patient position, special equipment/implants available, medications/allergies/relevant history reviewed, required imaging and test results available.   Sterile Technique Maximal sterile technique including sterile barrier drape, hand hygiene, sterile gown, sterile gloves, mask, hair covering.    Procedure Description Appropriate anatomy identified by palpation.  Patient's neck prepped and draped in sterile fashion.  1% lidocaine with epinephrine was used to anesthetize skin overlying neck.  1.5cm incision made and blunt dissection performed until tracheal rings could be easily palpated.   Then a size 6-0 Shiley tracheostomy was placed under bronchoscopic visualization using usual Seldinger technique and serial dilation.   Bronchoscope confirmed placement above the carina.  Tracheostomy was sutured in place with adhesive pad to protect skin under pressure.    Patient connected to ventilator.   Complications/Tolerance None; patient tolerated the procedure well. Chest X-ray is ordered to confirm no post-procedural complication.   EBL Minimal   Specimen(s) None

## 2021-05-26 NOTE — Progress Notes (Signed)
°  Patient ID: YSABEL COWGILL, male   DOB: 1950/08/23, 71 y.o.   MRN: [id number]  Extracorporeal support note   ECLS support day: 5 Indication: ARDS  Configuration: VV  Drainage cannula: [place]cent R IJ Return cannula: Crescent R IJ  Pump speed: 2900 rpm Pump flow: 3.6 L/min Pump used: Cardiohelp  Sweep gas: 3 (decreased this morning)  Circuit check: Minimal thrombus. Good color change. LDH stable.  Anticoagulant: Bivalirudin, PTT goal 50-80  Changes in support: Continue current support. Diurese. Vent adjusted per CCM.   Anticipated goals/duration of support: Wean to discontinuation  Loralie Champagne, MD  7:38 AM

## 2021-05-26 NOTE — Progress Notes (Signed)
NAME:  Randall Hodges, MRN:  [id number], DOB:  09-Jul-1950, LOS: 63 ADMISSION DATE:  05/09/2021, CONSULTATION DATE: 04/30/2021 REFERRING MD:  Dahlia Byes, MD, CHIEF COMPLAINT: Increasing shortness of breath  History of Present Illness:  71 year old male with coronary artery disease who initially presented with chest pain, noted to have multivessel coronary artery disease, he underwent CABG x3 on 04/1818 23, course was complicated with atrial fibrillation, frequent hiccups and aspiration pneumonia leading to hypoxia and increasing oxygen requirement. PCCM was consulted for evaluation and help with management  Patient stated hiccups are better but continued complain of shortness of breath, cough unable to bring up phlegm.  Denies chest pain or palpitation During my evaluation patient is on high flow nasal cannula oxygen at 12 L and he is requiring low-dose Levophed to maintain MAP above 65.  Of note patient had echocardiogram repeated today, consistent with constrictive picture.  Pertinent  Medical History   Past Medical History:  Diagnosis Date   Allergic rhinitis    Prostate cancer ([place])     Significant Hospital Events: Including procedures, antibiotic start and stop dates in addition to other pertinent events   1/18 CABG 1/30 PCCM consult, Worsening O2 needs, likely aspiration, new pressor need 2/2 intubation, placed on VV ECMO 2/5 issues with recirc, bronch neg, improved with supine positioning  Interim History / Subjective:  No events. Starting to wean down sweep. CVP low. Recirc better with lower flows.  Objective   Blood pressure 109/60, pulse 79, temperature 98.4 F (36.9 C), temperature source Core, resp. rate (!) 25, height _0  (1.778 m), weight 83.5 kg, SpO2 99 %. CVP:  [5 mmHg-10 mmHg] 6 mmHg  Vent Mode: PCV FiO2 (%):  [50 %] 50 % Set Rate:  [20 bmp-24 bmp] 24 bmp PEEP:  [10 cmH20] 10 cmH20 Plateau Pressure:  [21 cmH20-29 cmH20] 29 cmH20   Intake/Output  Summary (Last 24 hours) at 05/26/2021 0817 Last data filed at 05/26/2021 0700 Gross per 24 hour  Intake 3752.38 ml  Output 4400 ml  Net -647.62 ml    Filed Weights   05/24/21 0440 05/25/21 0500 05/26/21 0421  Weight: 83.3 kg 84.1 kg 83.5 kg    Examination: No distress on vent Lungs with crackles, mild bases Minimal edema Sternotomy scar well healed Will still wake up and follow commands with sedation wean Stable clot in oxygenator 12 oclock Bedside US: effusions have resolved  Current drips: fent, amio, precedex, bival CVP 6  Lab review Renal function stable Sodium up Bicarb up LDH 1400>>615>>380>>428>>391>>354 CBG ok CBC stable, H/H responded to transfusion Lactate flat and normal PTT 53 (50-80 goal) Fibrinogen continues to trend down  CXR mildly improved Plat 23, DP 13  Resolved Hospital Problem list   Hiccups postop Postop recurrent shock question constrictive pericaritis type physiology vs. Septic  Assessment & Plan:  CAD with unstable angina s/p CABGx3 1/18 (LIMA-LAD, SVG-OM, SVG-left PDA) Presumed aspiration pneumonitis vs. HCAP vs. ?AEILD culminating in severe ARDS with inability to ventilate along with high Aa gradient s/p VV ECMO cannulation 05/21/22.  CXR deteriorated between 1/26 and 1/29.  Slowly improving.  Repeat RVP/COVID neg.  BAL neg.  Sed rate improving. Need for [facility] for ECMO Atypical aflutter on amio Baseline fibrotic lung disease with preserved PFTs 2019, question of sarcoid- lost to f/u, see Dr. Matilde Bash note 08/15/17 AKI- multifactorial (sepsis, poor cardiac outflow, shock) improved Bilateral effusions- post CABG, R>L on bedside US (2/3), do not think would help vent wean at this time  to drain  - Sweep wean today, if fails will proceed with tracheostomy to facilitate vent wean - Liberalized PC to 20/10 - Usual hemolysis panel monitoring - Bival with PharmD aPTT monitoring and adjustment - Amio gtt for now - Fent/Precedex titrated to RASS -1 -  Continue meropenem x 7 days - Continue steroids for now; wean once starts to improve - PT/OT, TF as ordered  Best Practice (right click and "Reselect all SmartList Selections" daily)   Diet/type: Vital @ 55m/hr DVT prophylaxis: Bival CBG: yes, SSI GI prophylaxis: PPI Lines: L [place] TLC, RIJ  Foley:  Yes, keep Code Status:  full code Last date of multidisciplinary goals of care discussion [daily at bedside]  34 min cc time DErskine EmeryMD PCCM

## 2021-05-26 NOTE — Progress Notes (Signed)
ANTICOAGULATION CONSULT NOTE -  Pharmacy Consult for Bivalirudin Indication:  ECMO  Allergies  Allergen Reactions   Amoxicillin-Pot Clavulanate     Per pt report on 08/19/20, makes his urine dark colored   Atorvastatin Other (See Comments)     ( pt states causing runny nose, headaches, issue w/ urination)   Plant Derived Enzymes     Other reaction(s): Unknown   Trichophyton Other (See Comments)    Patient Measurements: Height: 5\' 10"  (177.8 cm) Weight: 83.5 kg (184 lb 1.4 oz) IBW/kg (Calculated) : 73  Vital Signs: Temp: 98.4 F (36.9 C) (02/07 0800) Temp Source: Core (02/07 0800) BP: 103/64 (02/07 1100) Pulse Rate: 73 (02/07 1100)  Labs: Recent Labs    05/24/21 0329 05/24/21 0330 05/25/21 0344 05/25/21 0528 05/25/21 1535 05/25/21 1544 05/26/21 0328 05/26/21 0611 05/26/21 0803 05/26/21 1153 05/26/21 1216  HGB 7.4*   < > 9.6*   < > 9.9*   < > 9.5*   9.2*   < > 10.9* 11.6* 11.6*  HCT 23.6*   < > 29.7*   < > 30.8*   < > 30.2*   27.0*   < > 32.0* 34.0* 34.0*  PLT 165   < > 195  --  202  --  192  --   --   --   --   APTT 55*   < > 49*  --  47*  --  53*  --   --   --   --   LABPROT 22.3*  --  22.3*  --   --   --  22.1*  --   --   --   --   INR 2.0*  --  2.0*  --   --   --  1.9*  --   --   --   --   CREATININE 1.34*   < > 1.17  --  1.17  --  1.10  --   --   --   --    < > = values in this interval not displayed.     Estimated Creatinine Clearance: 63.6 mL/min (by C-G formula based on SCr of 1.1 mg/dL).   Medical History: Past Medical History:  Diagnosis Date   Allergic rhinitis    Prostate cancer ([place])     Medications:  Infusions:   sodium chloride Stopped (05/26/21 0138)   albumin human     amiodarone 30 mg/hr (05/26/21 0900)   bivalirudin (ANGIOMAX) infusion 0.5 mg/mL (Non-ACS indications) Stopped (05/26/21 2202)   dexmedetomidine (PRECEDEX) IV infusion 0.5 mcg/kg/hr (05/26/21 1041)   feeding supplement (VITAL 1.5 CAL) 1,000 mL (05/25/21 2354)   fentaNYL  infusion INTRAVENOUS 200 mcg/hr (05/26/21 0900)   meropenem (MERREM) IV 1 g (05/26/21 1038)    Assessment: 71 year old male with coronary artery disease who initially presented with chest pain, noted to have multivessel coronary artery disease, he underwent CABG x3 on 04/26/2021, course was complicated with atrial fibrillation, frequent hiccups and aspiration pneumonia leading to hypoxia and increasing oxygen requirement. Patient now requiring VV ECMO. Pharmacy consulted for bivalirudin IV.   aPTT is at goal 53 sec on bivalirudin drip rate 0.038mg /kg/hr). Hgb stable at 9-10 plt 200s, LDH stable 300s. Fibrinogen stable 300-400. No issues with infusion or bleeding per RN - small amount of fibrin in circuit but stable.  Goal of Therapy:  aPTT 50-80 seconds Monitor platelets by anticoagulation protocol: Yes   Plan:  Holding bivalirudin for trach today then will resume at current  rate 0.038 mg/kg/hr  Aptt and CBC q12 hour checks    Bonnita Nasuti Pharm.D. CPP, BCPS Clinical Pharmacist [phone] 05/26/2021 12:27 PM

## 2021-05-27 ENCOUNTER — Inpatient Hospital Stay (HOSPITAL_COMMUNITY): Payer: PPO

## 2021-05-27 DIAGNOSIS — I249 Acute ischemic heart disease, unspecified: Secondary | ICD-10-CM | POA: Diagnosis not present

## 2021-05-27 DIAGNOSIS — J9601 Acute respiratory failure with hypoxia: Secondary | ICD-10-CM | POA: Diagnosis not present

## 2021-05-27 LAB — GLUCOSE, CAPILLARY
Glucose-Capillary: 154 mg/dL — ABNORMAL HIGH (ref 70–99)
Glucose-Capillary: 169 mg/dL — ABNORMAL HIGH (ref 70–99)
Glucose-Capillary: 169 mg/dL — ABNORMAL HIGH (ref 70–99)
Glucose-Capillary: 176 mg/dL — ABNORMAL HIGH (ref 70–99)
Glucose-Capillary: 181 mg/dL — ABNORMAL HIGH (ref 70–99)
Glucose-Capillary: 183 mg/dL — ABNORMAL HIGH (ref 70–99)
Glucose-Capillary: 196 mg/dL — ABNORMAL HIGH (ref 70–99)

## 2021-05-27 LAB — CBC
HCT: 32.9 % — ABNORMAL LOW (ref 39.0–52.0)
HCT: 35.5 % — ABNORMAL LOW (ref 39.0–52.0)
Hemoglobin: 9.9 g/dL — ABNORMAL LOW (ref 13.0–17.0)
Hemoglobin: 10.5 g/dL — ABNORMAL LOW (ref 13.0–17.0)
MCH: 28.3 pg (ref 26.0–34.0)
MCH: 27.8 pg (ref 26.0–34.0)
MCHC: 30.1 g/dL (ref 30.0–36.0)
MCHC: 29.6 g/dL — ABNORMAL LOW (ref 30.0–36.0)
MCV: 94 fL (ref 80.0–100.0)
MCV: 93.9 fL (ref 80.0–100.0)
Platelets: 191 10*3/uL (ref 150–400)
Platelets: 258 10*3/uL (ref 150–400)
RBC: 3.5 MIL/uL — ABNORMAL LOW (ref 4.22–5.81)
RBC: 3.78 MIL/uL — ABNORMAL LOW (ref 4.22–5.81)
RDW: 16.4 % — ABNORMAL HIGH (ref 11.5–15.5)
RDW: 16.7 % — ABNORMAL HIGH (ref 11.5–15.5)
WBC: 12.8 10*3/uL — ABNORMAL HIGH (ref 4.0–10.5)
WBC: 17.9 10*3/uL — ABNORMAL HIGH (ref 4.0–10.5)
nRBC: 0 % (ref 0.0–0.2)
nRBC: 0.2 % (ref 0.0–0.2)

## 2021-05-27 LAB — TYPE AND SCREEN
ABO/RH(D): O POS
Antibody Screen: NEGATIVE
Unit division: 0
Unit division: 0
Unit division: 0
Unit division: 0

## 2021-05-27 LAB — BASIC METABOLIC PANEL
Anion gap: 5 (ref 5–15)
Anion gap: 7 (ref 5–15)
BUN: 55 mg/dL — ABNORMAL HIGH (ref 8–23)
BUN: 55 mg/dL — ABNORMAL HIGH (ref 8–23)
CO2: 31 mmol/L (ref 22–32)
CO2: 32 mmol/L (ref 22–32)
Calcium: 7.9 mg/dL — ABNORMAL LOW (ref 8.9–10.3)
Calcium: 8.1 mg/dL — ABNORMAL LOW (ref 8.9–10.3)
Chloride: 114 mmol/L — ABNORMAL HIGH (ref 98–111)
Chloride: 112 mmol/L — ABNORMAL HIGH (ref 98–111)
Creatinine, Ser: 1.02 mg/dL (ref 0.61–1.24)
Creatinine, Ser: 1.06 mg/dL (ref 0.61–1.24)
GFR, Estimated: >60 mL/min (ref >60)
GFR, Estimated: >60 mL/min (ref >60)
Glucose, Bld: 185 mg/dL — ABNORMAL HIGH (ref 70–99)
Glucose, Bld: 182 mg/dL — ABNORMAL HIGH (ref 70–99)
Potassium: 4.7 mmol/L (ref 3.5–5.1)
Potassium: 4.6 mmol/L (ref 3.5–5.1)
Sodium: 150 mmol/L — ABNORMAL HIGH (ref 135–145)
Sodium: 151 mmol/L — ABNORMAL HIGH (ref 135–145)

## 2021-05-27 LAB — POCT I-STAT 7, (LYTES, BLD GAS, ICA,H+H)
Acid-Base Excess: 7 mmol/L — ABNORMAL HIGH (ref 0.0–2.0)
Acid-Base Excess: 6 mmol/L — ABNORMAL HIGH (ref 0.0–2.0)
Acid-Base Excess: 6 mmol/L — ABNORMAL HIGH (ref 0.0–2.0)
Acid-Base Excess: 4 mmol/L — ABNORMAL HIGH (ref 0.0–2.0)
Acid-Base Excess: 6 mmol/L — ABNORMAL HIGH (ref 0.0–2.0)
Acid-Base Excess: 7 mmol/L — ABNORMAL HIGH (ref 0.0–2.0)
Acid-Base Excess: 6 mmol/L — ABNORMAL HIGH (ref 0.0–2.0)
Acid-Base Excess: 6 mmol/L — ABNORMAL HIGH (ref 0.0–2.0)
Bicarbonate: 33.5 mmol/L — ABNORMAL HIGH (ref 20.0–28.0)
Bicarbonate: 32.3 mmol/L — ABNORMAL HIGH (ref 20.0–28.0)
Bicarbonate: 33.4 mmol/L — ABNORMAL HIGH (ref 20.0–28.0)
Bicarbonate: 32.3 mmol/L — ABNORMAL HIGH (ref 20.0–28.0)
Bicarbonate: 33 mmol/L — ABNORMAL HIGH (ref 20.0–28.0)
Bicarbonate: 33.6 mmol/L — ABNORMAL HIGH (ref 20.0–28.0)
Bicarbonate: 32.6 mmol/L — ABNORMAL HIGH (ref 20.0–28.0)
Bicarbonate: 33.3 mmol/L — ABNORMAL HIGH (ref 20.0–28.0)
Calcium, Ion: 1.22 mmol/L (ref 1.15–1.40)
Calcium, Ion: 1.25 mmol/L (ref 1.15–1.40)
Calcium, Ion: 1.19 mmol/L (ref 1.15–1.40)
Calcium, Ion: 1.21 mmol/L (ref 1.15–1.40)
Calcium, Ion: 1.18 mmol/L (ref 1.15–1.40)
Calcium, Ion: 1.2 mmol/L (ref 1.15–1.40)
Calcium, Ion: 1.21 mmol/L (ref 1.15–1.40)
Calcium, Ion: 1.2 mmol/L (ref 1.15–1.40)
HCT: 31 % — ABNORMAL LOW (ref 39.0–52.0)
HCT: 32 % — ABNORMAL LOW (ref 39.0–52.0)
HCT: 35 % — ABNORMAL LOW (ref 39.0–52.0)
HCT: 35 % — ABNORMAL LOW (ref 39.0–52.0)
HCT: 34 % — ABNORMAL LOW (ref 39.0–52.0)
HCT: 34 % — ABNORMAL LOW (ref 39.0–52.0)
HCT: 33 % — ABNORMAL LOW (ref 39.0–52.0)
HCT: 32 % — ABNORMAL LOW (ref 39.0–52.0)
Hemoglobin: 10.5 g/dL — ABNORMAL LOW (ref 13.0–17.0)
Hemoglobin: 10.9 g/dL — ABNORMAL LOW (ref 13.0–17.0)
Hemoglobin: 11.9 g/dL — ABNORMAL LOW (ref 13.0–17.0)
Hemoglobin: 11.9 g/dL — ABNORMAL LOW (ref 13.0–17.0)
Hemoglobin: 11.6 g/dL — ABNORMAL LOW (ref 13.0–17.0)
Hemoglobin: 11.6 g/dL — ABNORMAL LOW (ref 13.0–17.0)
Hemoglobin: 11.2 g/dL — ABNORMAL LOW (ref 13.0–17.0)
Hemoglobin: 10.9 g/dL — ABNORMAL LOW (ref 13.0–17.0)
O2 Saturation: 98 %
O2 Saturation: 98 %
O2 Saturation: 98 %
O2 Saturation: 97 %
O2 Saturation: 98 %
O2 Saturation: 98 %
O2 Saturation: 98 %
O2 Saturation: 99 %
Patient temperature: 36.9
Patient temperature: 36.8
Patient temperature: 36.8
Patient temperature: 36.8
Patient temperature: 36.8
Patient temperature: 36.9
Patient temperature: 36.9
Patient temperature: 36.8
Potassium: 4.6 mmol/L (ref 3.5–5.1)
Potassium: 4.4 mmol/L (ref 3.5–5.1)
Potassium: 4.5 mmol/L (ref 3.5–5.1)
Potassium: 4.6 mmol/L (ref 3.5–5.1)
Potassium: 4.5 mmol/L (ref 3.5–5.1)
Potassium: 4.4 mmol/L (ref 3.5–5.1)
Potassium: 4.5 mmol/L (ref 3.5–5.1)
Potassium: 4.4 mmol/L (ref 3.5–5.1)
Sodium: 150 mmol/L — ABNORMAL HIGH (ref 135–145)
Sodium: 150 mmol/L — ABNORMAL HIGH (ref 135–145)
Sodium: 149 mmol/L — ABNORMAL HIGH (ref 135–145)
Sodium: 150 mmol/L — ABNORMAL HIGH (ref 135–145)
Sodium: 152 mmol/L — ABNORMAL HIGH (ref 135–145)
Sodium: 151 mmol/L — ABNORMAL HIGH (ref 135–145)
Sodium: 150 mmol/L — ABNORMAL HIGH (ref 135–145)
Sodium: 150 mmol/L — ABNORMAL HIGH (ref 135–145)
TCO2: 35 mmol/L — ABNORMAL HIGH (ref 22–32)
TCO2: 34 mmol/L — ABNORMAL HIGH (ref 22–32)
TCO2: 35 mmol/L — ABNORMAL HIGH (ref 22–32)
TCO2: 34 mmol/L — ABNORMAL HIGH (ref 22–32)
TCO2: 35 mmol/L — ABNORMAL HIGH (ref 22–32)
TCO2: 35 mmol/L — ABNORMAL HIGH (ref 22–32)
TCO2: 34 mmol/L — ABNORMAL HIGH (ref 22–32)
TCO2: 35 mmol/L — ABNORMAL HIGH (ref 22–32)
pCO2 arterial: 57.3 mmHg — ABNORMAL HIGH (ref 32.0–48.0)
pCO2 arterial: 56.9 mmHg — ABNORMAL HIGH (ref 32.0–48.0)
pCO2 arterial: 59.9 mmHg — ABNORMAL HIGH (ref 32.0–48.0)
pCO2 arterial: 65.6 mmHg (ref 32.0–48.0)
pCO2 arterial: 58.9 mmHg — ABNORMAL HIGH (ref 32.0–48.0)
pCO2 arterial: 58.3 mmHg — ABNORMAL HIGH (ref 32.0–48.0)
pCO2 arterial: 59.7 mmHg — ABNORMAL HIGH (ref 32.0–48.0)
pCO2 arterial: 58.7 mmHg — ABNORMAL HIGH (ref 32.0–48.0)
pH, Arterial: 7.375 (ref 7.350–7.450)
pH, Arterial: 7.361 (ref 7.350–7.450)
pH, Arterial: 7.353 (ref 7.350–7.450)
pH, Arterial: 7.3 — ABNORMAL LOW (ref 7.350–7.450)
pH, Arterial: 7.355 (ref 7.350–7.450)
pH, Arterial: 7.368 (ref 7.350–7.450)
pH, Arterial: 7.345 — ABNORMAL LOW (ref 7.350–7.450)
pH, Arterial: 7.36 (ref 7.350–7.450)
pO2, Arterial: 105 mmHg (ref 83.0–108.0)
pO2, Arterial: 118 mmHg — ABNORMAL HIGH (ref 83.0–108.0)
pO2, Arterial: 113 mmHg — ABNORMAL HIGH (ref 83.0–108.0)
pO2, Arterial: 100 mmHg (ref 83.0–108.0)
pO2, Arterial: 121 mmHg — ABNORMAL HIGH (ref 83.0–108.0)
pO2, Arterial: 113 mmHg — ABNORMAL HIGH (ref 83.0–108.0)
pO2, Arterial: 105 mmHg (ref 83.0–108.0)
pO2, Arterial: 125 mmHg — ABNORMAL HIGH (ref 83.0–108.0)

## 2021-05-27 LAB — PROTIME-INR
INR: 2.1 — ABNORMAL HIGH (ref 0.8–1.2)
Prothrombin Time: 23.7 seconds — ABNORMAL HIGH (ref 11.4–15.2)

## 2021-05-27 LAB — LACTATE DEHYDROGENASE: LDH: 313 U/L — ABNORMAL HIGH (ref 98–192)

## 2021-05-27 LAB — BPAM RBC
Blood Product Expiration Date: 202303032359
Blood Product Expiration Date: 202303032359
Blood Product Expiration Date: 202303032359
Blood Product Expiration Date: 202303042359
ISSUE DATE / TIME: 202302030953
Unit Type and Rh: 5100
Unit Type and Rh: 5100
Unit Type and Rh: 5100
Unit Type and Rh: 5100

## 2021-05-27 LAB — HEPATIC FUNCTION PANEL
ALT: 306 U/L — ABNORMAL HIGH (ref 0–44)
AST: 86 U/L — ABNORMAL HIGH (ref 15–41)
Albumin: 2 g/dL — ABNORMAL LOW (ref 3.5–5.0)
Alkaline Phosphatase: 185 U/L — ABNORMAL HIGH (ref 38–126)
Bilirubin, Direct: 0.6 mg/dL — ABNORMAL HIGH (ref 0.0–0.2)
Indirect Bilirubin: 0.7 mg/dL (ref 0.3–0.9)
Total Bilirubin: 1.3 mg/dL — ABNORMAL HIGH (ref 0.3–1.2)
Total Protein: 4.2 g/dL — ABNORMAL LOW (ref 6.5–8.1)

## 2021-05-27 LAB — COOXEMETRY PANEL
Carboxyhemoglobin: 1.3 % (ref 0.5–1.5)
Methemoglobin: 0.8 % (ref 0.0–1.5)
O2 Saturation: 73.9 %
Total hemoglobin: 10.6 g/dL — ABNORMAL LOW (ref 12.0–16.0)

## 2021-05-27 LAB — APTT
aPTT: 48 seconds — ABNORMAL HIGH (ref 24–36)
aPTT: 49 seconds — ABNORMAL HIGH (ref 24–36)
aPTT: 43 seconds — ABNORMAL HIGH (ref 24–36)

## 2021-05-27 LAB — FIBRINOGEN: Fibrinogen: 396 mg/dL (ref 210–475)

## 2021-05-27 LAB — LACTIC ACID, PLASMA: Lactic Acid, Venous: 1.7 mmol/L (ref 0.5–1.9)

## 2021-05-27 LAB — MAGNESIUM: Magnesium: 2.3 mg/dL (ref 1.7–2.4)

## 2021-05-27 LAB — SEDIMENTATION RATE: Sed Rate: 13 mm/hr (ref 0–16)

## 2021-05-27 MED ORDER — PANTOPRAZOLE 2 MG/ML SUSPENSION
40.0000 mg | Freq: Every day | ORAL | Status: DC
  Administered 2021-05-27 – 2021-05-29 (×3): 40 mg
  Filled 2021-05-27 (×3): qty 20

## 2021-05-27 MED ORDER — OXYCODONE HCL 5 MG PO TABS
5.0000 mg | ORAL_TABLET | Freq: Four times a day (QID) | ORAL | Status: DC | PRN
  Administered 2021-05-27 (×2): 5 mg
  Filled 2021-05-27 (×2): qty 1

## 2021-05-27 MED ORDER — FENTANYL 2500MCG IN NS 250ML (10MCG/ML) PREMIX INFUSION
0.0000 ug/h | INTRAVENOUS | Status: DC
  Administered 2021-05-27: 50 ug/h via INTRAVENOUS
  Administered 2021-05-27: 125 ug/h via INTRAVENOUS
  Administered 2021-05-29: 100 ug/h via INTRAVENOUS
  Filled 2021-05-27 (×2): qty 250

## 2021-05-27 MED ORDER — AMIODARONE IV BOLUS ONLY 150 MG/100ML
150.0000 mg | Freq: Once | INTRAVENOUS | Status: DC
Start: 2021-05-27 — End: 2021-05-27

## 2021-05-27 MED ORDER — QUETIAPINE FUMARATE 25 MG PO TABS
25.0000 mg | ORAL_TABLET | Freq: Every day | ORAL | Status: DC
  Administered 2021-05-27 – 2021-05-30 (×4): 25 mg
  Filled 2021-05-27 (×4): qty 1

## 2021-05-27 MED ORDER — SODIUM CHLORIDE 0.9% FLUSH
10.0000 mL | Freq: Two times a day (BID) | INTRAVENOUS | Status: DC
  Administered 2021-05-27 – 2021-06-06 (×12): 10 mL
  Administered 2021-06-06: 10:00:00 20 mL
  Administered 2021-06-07 – 2021-06-11 (×6): 10 mL
  Administered 2021-06-12: 10:00:00 40 mL
  Administered 2021-06-12 – 2021-06-13 (×2): 10 mL
  Administered 2021-06-13 – 2021-06-14 (×2): 40 mL
  Administered 2021-06-14: 21:00:00 10 mL
  Administered 2021-06-15: 22:00:00 20 mL
  Administered 2021-06-15 – 2021-06-16 (×3): 10 mL

## 2021-05-27 MED ORDER — FUROSEMIDE 10 MG/ML IJ SOLN
60.0000 mg | Freq: Two times a day (BID) | INTRAMUSCULAR | Status: DC
  Administered 2021-05-27: 60 mg via INTRAVENOUS
  Filled 2021-05-27: qty 6

## 2021-05-27 MED ORDER — FREE WATER
300.0000 mL | Status: DC
  Administered 2021-05-27 – 2021-05-31 (×24): 300 mL

## 2021-05-27 MED ORDER — SENNOSIDES 8.8 MG/5ML PO SYRP
5.0000 mL | ORAL_SOLUTION | Freq: Two times a day (BID) | ORAL | Status: DC
  Administered 2021-05-27 – 2021-05-29 (×6): 5 mL
  Filled 2021-05-27 (×6): qty 5

## 2021-05-27 MED ORDER — AMIODARONE LOAD VIA INFUSION
150.0000 mg | Freq: Once | INTRAVENOUS | Status: AC
  Administered 2021-05-27: 150 mg via INTRAVENOUS
  Filled 2021-05-27: qty 83.34

## 2021-05-27 MED ORDER — SODIUM CHLORIDE 0.9% FLUSH: 10.0000 mL | INTRAVENOUS | Status: DC | PRN

## 2021-05-27 MED ORDER — QUETIAPINE FUMARATE 25 MG PO TABS: 25.0000 mg | ORAL_TABLET | Freq: Every day | ORAL | Status: DC

## 2021-05-27 NOTE — Progress Notes (Signed)
This chaplain responded to PMT consult for ongoing spiritual support. The chaplain is appreciative of the communication with RN-Kara before the visit. The Pt. preferred name is Randall Hodges.  The chaplain joined the Pt. and the Pt. Wife-Pam at the bedside. The chaplain witnessed Pam offer the Pt. a calm and peaceful introduction to the chaplain before the chaplain sat down with Pam.  The chaplain listened reflectively and offered emotional support as Pam shared her strong connection to her family and faith community. The chaplain understands the Pt. has a daily ritual of listening to [place] and a deep faith of his own. Pam is leaning on prayer to keep her focused on patience and celebrating each day with a renewed sense of hope.   Pam accepted the chaplain's invitation for prayer and a continued calm listening presence.   Chaplain Sallyanne Kuster [phone]

## 2021-05-27 NOTE — Progress Notes (Signed)
Patient ID: Randall Hodges, male   DOB: 05-Jan-1951, 71 y.o.   MRN: [id number]  TCTS Evening Rounds:  Hemodynamics stable today.  ECMO flow 2.5. CVP 7  Sweep down to 4.0  Remains on vent 50% FiO2  Good urine output.  BMET    Component Value Date/Time   NA 151 (H) 05/27/2021 1625   NA 142 08/08/2020 1546   K 4.6 05/27/2021 1625   CL 112 (H) 05/27/2021 1625   CO2 32 05/27/2021 1625   GLUCOSE 182 (H) 05/27/2021 1625   BUN 55 (H) 05/27/2021 1625   BUN 17 08/08/2020 1546   CREATININE 1.06 05/27/2021 1625   CALCIUM 8.1 (L) 05/27/2021 1625   EGFR 64 08/08/2020 1546   GFRNONAA >60 05/27/2021 1625   CBC    Component Value Date/Time   WBC 17.9 (H) 05/27/2021 1625   RBC 3.78 (L) 05/27/2021 1625   HGB 10.5 (L) 05/27/2021 1625   HCT 35.5 (L) 05/27/2021 1625   PLT 258 05/27/2021 1625   MCV 93.9 05/27/2021 1625   MCH 27.8 05/27/2021 1625   MCHC 29.6 (L) 05/27/2021 1625   RDW 16.7 (H) 05/27/2021 1625   LYMPHSABS 2.5 08/01/2017 1627   MONOABS 0.7 08/01/2017 1627   EOSABS 0.4 08/01/2017 1627   BASOSABS 0.0 08/01/2017 1627

## 2021-05-27 NOTE — Progress Notes (Signed)
Physical Therapy Treatment Patient Details Name: Randall Hodges MRN: [id number] DOB: 1950-05-29 Today's Date: 05/27/2021   History of Present Illness 71 yo male with CAD who initially presented with CP, noted to have multivessel CAD, he underwent CABG x3 on 04/27/2021, course was complicated with atrial fibrillation, frequent hiccups and aspiration PNA leading to hypoxia and increasing oxygen requirement. ETT 1/18-1/18. 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 Cortrak 1/30. 2/2 placed on VV ECMO. 2/7 trach. PMH: prostate CA    PT Comments    Pt alert, follows ~25% of commands I.e. squeeze hand, thumbs up, stick out tongue. Session focused on PROM of all extremities and tilting to promote BLE weightbearing and upright positioning. Pt tolerated tilting in Kreg bed up to 50 degrees for 13 minutes. Able to perform bilateral quad contraction intermittently. Will continue to progress mobility as tolerated.    Recommendations for follow up therapy are one component of a multi-disciplinary discharge planning process, led by the attending physician.  Recommendations may be updated based on patient status, additional functional criteria and insurance authorization.  Follow Up Recommendations  Acute inpatient rehab (3hours/day)     Assistance Recommended at Discharge Frequent or constant Supervision/Assistance  Patient can return home with the following A lot of help with walking and/or transfers   Equipment Recommendations  BSC/3in1;Wheelchair (measurements PT);Wheelchair cushion (measurements PT);Hospital bed;Other (comment) (hoyer lift)    Recommendations for Other Services       Precautions / Restrictions Precautions Precautions: Fall;Sternal Precaution Comments: ECMO, cortrak, foley, aline, sternal precautions, flexiseal, trach on vent Restrictions Other Position/Activity Restrictions: sternal precautions     Mobility  Bed Mobility Overal bed mobility: Needs Assistance              General bed mobility comments: kreg bed tilt to 50 degrees tolerated 1212-1225    Transfers                        Ambulation/Gait                   Stairs             Wheelchair Mobility    Modified Rankin (Stroke Patients Only)       Balance                                            Cognition Arousal/Alertness: Awake/alert Behavior During Therapy: Flat affect Overall Cognitive Status: Difficult to assess                                 General Comments: following ~~25% of commandsto stick out tongue, thumbs up, closing mouth        Exercises General Exercises - Upper Extremity Shoulder Flexion: Right, Supine, PROM, 5 reps Elbow Flexion: PROM, Both, 10 reps, Supine Digit Composite Flexion: PROM, Supine, 10 reps, Both General Exercises - Lower Extremity Ankle Circles/Pumps: Both, 15 reps, PROM, Supine Short Arc Quad: PROM, Both, 10 reps, Supine Other Exercises Other Exercises: bilat knee extension in tilt (performed intermittently to command)    General Comments General comments (skin integrity, edema, etc.): SpO2 100% on 50% FiO2, 10 PEEP, HR 85, 170/59 (88)      Pertinent Vitals/Pain Pain Assessment Pain Assessment: Faces Faces Pain Scale: No hurt    Home  Living                          Prior Function            PT Goals (current goals can now be found in the care plan section) Acute Rehab PT Goals Patient Stated Goal: unable to state Potential to Achieve Goals: Fair    Frequency    Min 3X/week      PT Plan Current plan remains appropriate    Co-evaluation              AM-PAC PT "6 Clicks" Mobility   Outcome Measure  Help needed turning from your back to your side while in a flat bed without using bedrails?: Total Help needed moving from lying on your back to sitting on the side of a flat bed without using bedrails?: Total Help needed moving to and  from a bed to a chair (including a wheelchair)?: Total Help needed standing up from a chair using your arms (e.g., wheelchair or bedside chair)?: Total Help needed to walk in hospital room?: Total Help needed climbing 3-5 steps with a railing? : Total 6 Click Score: 6    End of Session Equipment Utilized During Treatment: Oxygen Activity Tolerance: Patient tolerated treatment well Patient left: in bed;with call bell/phone within reach;with bed alarm set;with nursing/sitter in room Nurse Communication: Mobility status PT Visit Diagnosis: Muscle weakness (generalized) (M62.81);Other abnormalities of gait and mobility (R26.89);Difficulty in walking, not elsewhere classified (R26.2)     Time: 7564-3329 PT Time Calculation (min) (ACUTE ONLY): 33 min  Charges:  $Therapeutic Exercise: 8-22 mins $Therapeutic Activity: 8-22 mins                     Wyona Almas, PT, DPT Acute Rehabilitation Services Pager [phone] Office [phone]    Deno Etienne 05/27/2021, 1:41 PM

## 2021-05-27 NOTE — Progress Notes (Signed)
Patient going in and out of a fib 110s-120s rate. Aundra Dubin MD notified. Orders received to bolus with 150 mg of amiodarone and to increase the gtt to 60.

## 2021-05-27 NOTE — Progress Notes (Signed)
SLP Cancellation Note  Patient Details Name: Randall Hodges MRN: [id number] DOB: [birth year]   Cancelled treatment:       Reason Eval/Treat Not Completed: Patient not medically ready Patient with new tracheostomy. Orders for SLP eval and treat for PMSV and swallowing received. Will follow pt closely for readiness for SLP interventions as appropriate.     Macaila Tahir, Katherene Ponto 05/27/2021, 8:21 AM

## 2021-05-27 NOTE — Progress Notes (Addendum)
ANTICOAGULATION CONSULT NOTE  Pharmacy Consult for Bivalirudin Indication:  ECMO  Allergies  Allergen Reactions   Amoxicillin-Pot Clavulanate     Per pt report on 08/19/20, makes his urine dark colored   Atorvastatin Other (See Comments)     ( pt states causing runny nose, headaches, issue w/ urination)   Plant Derived Enzymes     Other reaction(s): Unknown   Trichophyton Other (See Comments)    Patient Measurements: Height: 5\' 10"  (177.8 cm) Weight: 78.5 kg (173 lb 1 oz) (pt on new bed, likely resulting in weight discrepancy) IBW/kg (Calculated) : 73  Vital Signs: Temp: 98.4 F (36.9 C) (02/08 1600) Temp Source: Core (02/08 1600) BP: 168/63 (02/08 0922) Pulse Rate: 82 (02/08 1700)  Labs: Recent Labs    05/25/21 0344 05/25/21 0528 05/26/21 0328 05/26/21 2876 05/26/21 1557 05/26/21 1603 05/27/21 0152 05/27/21 0157 05/27/21 0350 05/27/21 0546 05/27/21 1625  HGB 9.6*   < > 9.5*   9.2*   < > 11.0*   < >  --    < > 9.9* 10.9* 10.5*  HCT 29.7*   < > 30.2*   27.0*   < > 36.3*   < >  --    < > 32.9* 32.0* 35.5*  PLT 195   < > 192  --  232  --   --   --  191  --  258  APTT 49*   < > 53*  --  41*   < > 48*  --  49*  --  43*  LABPROT 22.3*  --  22.1*  --   --   --   --   --  23.7*  --   --   INR 2.0*  --  1.9*  --   --   --   --   --  2.1*  --   --   CREATININE 1.17   < > 1.10  --  1.08  --   --   --  1.02  --   --    < > = values in this interval not displayed.     Estimated Creatinine Clearance: 68.6 mL/min (by C-G formula based on SCr of 1.02 mg/dL).   Medical History: Past Medical History:  Diagnosis Date   Allergic rhinitis    Prostate cancer ([place])     Medications:  Infusions:   sodium chloride Stopped (05/26/21 0138)   albumin human     amiodarone 60 mg/hr (05/27/21 1700)   bivalirudin (ANGIOMAX) infusion 0.5 mg/mL (Non-ACS indications) 0.046 mg/kg/hr (05/27/21 1700)   dexmedetomidine (PRECEDEX) IV infusion Stopped (05/27/21 0533)   feeding supplement  (VITAL 1.5 CAL) 1,000 mL (05/27/21 1001)   meropenem (MERREM) IV Stopped (05/27/21 1008)    Assessment: 71 year old male with coronary artery disease who initially presented with chest pain, noted to have multivessel coronary artery disease, he underwent CABG x3 on 05/12/2021, course was complicated with atrial fibrillation, frequent hiccups and aspiration pneumonia leading to hypoxia and increasing oxygen requirement. Patient now requiring VV ECMO. Pharmacy consulted for bivalirudin IV.   aPTT slightly low, trending back down to 43 on current rate 0.046 mg/kg/hr. Hgb stable 10.5, plt WNL, LDH stable 300s. Fibrinogen stable 300-400. No issues with infusion or bleeding per RN. AKI noted resolving.  Goal of Therapy:  aPTT 50-80 seconds Monitor platelets by anticoagulation protocol: Yes   Plan:  Increase bivalirudin slightly to 0.051 mg/kg/hr Recheck aPTT at 0500 Monitor q12h CBC, closely for s/sx bleeding  Arturo Morton, PharmD, BCPS Please check AMION for all [facility] contact numbers Clinical Pharmacist 05/27/2021 5:35 PM

## 2021-05-27 NOTE — Progress Notes (Signed)
Peripherally Inserted Central Catheter Placement  The IV Nurse has discussed with the patient and/or persons authorized to consent for the patient, the purpose of this procedure and the potential benefits and risks involved with this procedure.  The benefits include less needle sticks, lab draws from the catheter, and the patient may be discharged home with the catheter. Risks include, but not limited to, infection, bleeding, blood clot (thrombus formation), and puncture of an artery; nerve damage and irregular heartbeat and possibility to perform a PICC exchange if needed/ordered by physician.  Alternatives to this procedure were also discussed.  Bard Power PICC patient education guide, fact sheet on infection prevention and patient information card has been provided to patient /or left at bedside.    PICC Placement Documentation  PICC Triple Lumen 05/27/21 PICC Right Brachial 38 cm 2 cm (Active)  Indication for Insertion or Continuance of Line Vasoactive infusions 05/27/21 1037  Exposed Catheter (cm) 2 cm 05/27/21 1037  Site Assessment Clean;Dry;Intact 05/27/21 1037  Lumen #1 Status Flushed;Blood return noted;Saline locked 05/27/21 1037  Lumen #2 Status Flushed;Blood return noted;Saline locked 05/27/21 1037  Lumen #3 Status Flushed;Blood return noted;Saline locked 05/27/21 1037  Dressing Type Transparent 05/27/21 1037  Dressing Status Clean;Dry;Intact 05/27/21 1037  Antimicrobial disc in place? Yes 05/27/21 1037  Dressing Change Due 06/03/21 05/27/21 1037       Scotty Court 05/27/2021, 10:43 AM

## 2021-05-27 NOTE — Progress Notes (Signed)
ANTICOAGULATION CONSULT NOTE -  Pharmacy Consult for Bivalirudin Indication:  ECMO  Allergies  Allergen Reactions   Amoxicillin-Pot Clavulanate     Per pt report on 08/19/20, makes his urine dark colored   Atorvastatin Other (See Comments)     ( pt states causing runny nose, headaches, issue w/ urination)   Plant Derived Enzymes     Other reaction(s): Unknown   Trichophyton Other (See Comments)    Patient Measurements: Height: 5\' 10"  (177.8 cm) Weight: 78.5 kg (173 lb 1 oz) (pt on new bed, likely resulting in weight discrepancy) IBW/kg (Calculated) : 73  Vital Signs: Temp: 98.2 F (36.8 C) (02/08 0800) Temp Source: Core (02/08 0800) BP: 132/70 (02/08 0800) Pulse Rate: 124 (02/08 0806)  Labs: Recent Labs    05/25/21 0344 05/25/21 0528 05/26/21 0328 05/26/21 9163 05/26/21 1557 05/26/21 1603 05/26/21 2001 05/26/21 2205 05/27/21 0152 05/27/21 0157 05/27/21 0350 05/27/21 0546  HGB 9.6*   < > 9.5*   9.2*   < > 11.0*   < >  --    < >  --  10.5* 9.9* 10.9*  HCT 29.7*   < > 30.2*   27.0*   < > 36.3*   < >  --    < >  --  31.0* 32.9* 32.0*  PLT 195   < > 192  --  232  --   --   --   --   --  191  --   APTT 49*   < > 53*  --  41*  --  34  --  48*  --  49*  --   LABPROT 22.3*  --  22.1*  --   --   --   --   --   --   --  23.7*  --   INR 2.0*  --  1.9*  --   --   --   --   --   --   --  2.1*  --   CREATININE 1.17   < > 1.10  --  1.08  --   --   --   --   --  1.02  --    < > = values in this interval not displayed.     Estimated Creatinine Clearance: 68.6 mL/min (by C-G formula based on SCr of 1.02 mg/dL).   Medical History: Past Medical History:  Diagnosis Date   Allergic rhinitis    Prostate cancer ([place])     Medications:  Infusions:   sodium chloride Stopped (05/26/21 0138)   albumin human     amiodarone 30 mg/hr (05/27/21 0801)   bivalirudin (ANGIOMAX) infusion 0.5 mg/mL (Non-ACS indications) 0.046 mg/kg/hr (05/27/21 0818)   dexmedetomidine (PRECEDEX) IV  infusion Stopped (05/27/21 0533)   feeding supplement (VITAL 1.5 CAL) 1,000 mL (05/26/21 1606)   fentaNYL infusion INTRAVENOUS Stopped (05/27/21 0719)   meropenem (MERREM) IV Stopped (05/27/21 0217)    Assessment: 71 year old male with coronary artery disease who initially presented with chest pain, noted to have multivessel coronary artery disease, he underwent CABG x3 on 05/17/2021, course was complicated with atrial fibrillation, frequent hiccups and aspiration pneumonia leading to hypoxia and increasing oxygen requirement. Patient now requiring VV ECMO. Pharmacy consulted for bivalirudin IV.   aPTT previously at goal on bivalirudin drip, currently low at 49 but has been trending up on current dose overnight. Hgb stable 10.9, plt WNL, LDH stable 300s. Fibrinogen stable 300-400. No issues with infusion per RN.  Will leave at current rate and follow up coags this afternoon.   Goal of Therapy:  aPTT 50-80 seconds Monitor platelets by anticoagulation protocol: Yes   Plan:  Continue bivalirudin at 0.046 mg/kg/hr for now Recheck aPTT at 1700 Monitor closely for increased s/sx bleeding  Erin Hearing PharmD., BCPS Clinical Pharmacist 05/27/2021 8:55 AM

## 2021-05-27 NOTE — Progress Notes (Signed)
NAME:  Randall Hodges, MRN:  [id number], DOB:  02-Nov-1950, LOS: 5 ADMISSION DATE:  04/19/2021, CONSULTATION DATE: 04/22/2021 REFERRING MD:  Dahlia Byes, MD, CHIEF COMPLAINT: Increasing shortness of breath  History of Present Illness:  71 year old male with coronary artery disease who initially presented with chest pain, noted to have multivessel coronary artery disease, he underwent CABG x3 on 04/1818 23, course was complicated with atrial fibrillation, frequent hiccups and aspiration pneumonia leading to hypoxia and increasing oxygen requirement. PCCM was consulted for evaluation and help with management  Patient stated hiccups are better but continued complain of shortness of breath, cough unable to bring up phlegm.  Denies chest pain or palpitation During my evaluation patient is on high flow nasal cannula oxygen at 12 L and he is requiring low-dose Levophed to maintain MAP above 65.  Of note patient had echocardiogram repeated today, consistent with constrictive picture.  Pertinent  Medical History   Past Medical History:  Diagnosis Date   Allergic rhinitis    Prostate cancer ([place])     Significant Hospital Events: Including procedures, antibiotic start and stop dates in addition to other pertinent events   1/18 CABG 1/30 PCCM consult, Worsening O2 needs, likely aspiration, new pressor need 2/2 intubation, placed on VV ECMO 2/5 issues with recirc, bronch neg, improved with supine positioning  Interim History / Subjective:  No events. Starting to wean down sweep. CVP low. Recirc better with lower flows. Two episodes of afib, self resolved  Objective   Blood pressure 132/70, pulse (!) 124, temperature 98.2 F (36.8 C), temperature source Core, resp. rate (!) 22, height _0  (1.778 m), weight 78.5 kg, SpO2 99 %. CVP:  [5 mmHg-9 mmHg] 7 mmHg  Vent Mode: PCV FiO2 (%):  [50 %] 50 % Set Rate:  [22 bmp-24 bmp] 22 bmp PEEP:  [10 cmH20] 10 cmH20 Plateau Pressure:  [24  cmH20-33 cmH20] 33 cmH20   Intake/Output Summary (Last 24 hours) at 05/27/2021 0854 Last data filed at 05/27/2021 0800 Gross per 24 hour  Intake 4097.86 ml  Output 2090 ml  Net 2007.86 ml   Filed Weights   05/25/21 0500 05/26/21 0421 05/27/21 0500  Weight: 84.1 kg 83.5 kg 78.5 kg    Examination: No distress on vent Lungs with crackles, mild bases Minimal edema Sternotomy scar well healed Will still wake up and follow commands with sedation wean Stable clot in oxygenator 12 oclock Bedside US: effusions have resolved  Current drips: fent, amio, precedex, bival CVP 6  Lab review Renal function stable Sodium up Bicarb up LDH 1400>>615>>380>>428>>391>>354>>313 CBG ok CBC stable, H/H responded to transfusion Lactate 1.7 and normal PTT 49 (50-80 goal) Fibrinogen continues to trend down  CXR increased opacity compared to previous CXR. Plat 67, DP 13  Resolved Hospital Problem list   Hiccups postop Postop recurrent shock question constrictive pericaritis type physiology vs. Septic  Assessment & Plan:  CAD with unstable angina s/p CABGx3 1/18 (LIMA-LAD, SVG-OM, SVG-left PDA) Presumed aspiration pneumonitis vs. HCAP vs. ?AEILD culminating in severe ARDS with inability to ventilate along with high Aa gradient s/p VV ECMO cannulation 05/21/22.  CXR deteriorated between 1/26 and 1/29.  Slowly improving.  Repeat RVP/COVID neg.  BAL neg.  Sed rate improving. Need for [facility] for ECMO Atypical aflutter on amio Baseline fibrotic lung disease with preserved PFTs 2019, question of sarcoid- lost to f/u, see Dr. Matilde Bash note 08/15/17 AKI- multifactorial (sepsis, poor cardiac outflow, shock) improved Bilateral effusions- post CABG, R>L on bedside US (2/3),  do not think would help vent wean at this time to drain  - Continue sweep wean - Diarrhetics per pharmD - hydralazine TID  - hyroxyzine PRN for pain management - Liberalized PC to 20/10 - Usual hemolysis panel monitoring - Bival with  PharmD aPTT monitoring and adjustment - Amio gtt for now - Fent/Precedex titrated to RASS -1 - Continue meropenem x 7 days - Continue steroids for now; wean once starts to improve - PT/OT, TF as ordered  Best Practice (right click and "Reselect all SmartList Selections" daily)   Diet/type: Vital @ 74m/hr DVT prophylaxis: Bival CBG: yes, SSI GI prophylaxis: PPI Lines: L Joseph TLC, RIJ  Foley:  Yes, keep Code Status:  full code Last date of multidisciplinary goals of care discussion [daily at bedside]  34 min cc time DErskine EmeryMD PCCM

## 2021-05-27 NOTE — Progress Notes (Signed)
Patient ID: Randall Hodges, male   DOB: 12-Sep-1950, 71 y.o.   MRN: [id number]   Progress Note  Patient Name: Randall Hodges Date of Encounter: 05/27/2021  [facility] Cardiologist: [name], MD   Subjective   2/2: VV ECMO initiation, Crescent catheter right IJ 2/7: Tracheostomy  Remains on vent. On precedex for sedation. Will wake up and follow commands. CVP 8 with positive I/Os. Received acetazolamide 250 mg bid yesterday. Co-ox 74%.  Off vasopressin and NE  He is in NSR on amiodarone gtt.  Bivalirudin gtt for ECMO circuit. No bleeding  LDH 615 -> 380 -> 428 -> 391 -> 354 -> 313  Has CorTrak tube feeds.   Vent FiO2 0.5. CXR still with bilateral diffuse airspace disease. ECMO cannula is positional, get recirculation in certain positions but can be corrected. Have not been able to wean sweep yet.   ECMO: Speed 2900 rpm Flow 3.5 L/min pVen -58 DeltaP 18 PTT 49 (goal 50-80) LDH 313 Lactate 1.7 ABG 7.36/57/118/98%  Cardiac Studies: Echo (limited, 1/30): Echo reviewed, there is clear respirophasic variation of the interventricular septum and marked respirophasic variation of E inflow velocity on doppler evaluation of the mitral valve.  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  LV EF 45-50%.   RHC Procedural Findings (on norepinephrine 6): Hemodynamics (mmHg) RA mean 12 RV 37/12 PA 38/16, mean 27 PCWP mean 11 LV 108/12 AO 96/55 PAPI 1.8 Oxygen saturations: PA 54% AO 94% Cardiac Output (Fick) 5.59  Cardiac Index (Fick) 2.81  PVR 2.8 WU Simultaneous RV/LV tracings were obtained.  Difficult to interpret due to atrial fibrillation.  There was some suggestion of discordance (ventricular interdependence) but not clear.  Inpatient Medications    Scheduled Meds:  sodium chloride    Intravenous Once   acetaZOLAMIDE  250 mg Per Tube BID   amLODipine  10 mg Per NG tube Daily   aspirin  81 mg Per Tube Daily   chlorhexidine gluconate (MEDLINE KIT)  15 mL Mouth Rinse BID   Chlorhexidine Gluconate Cloth  6 each Topical Daily   colchicine  0.6 mg Per Tube Daily   docusate  100 mg Per Tube BID   feeding supplement (PROSource TF)  45 mL Per Tube TID   fentaNYL (SUBLIMAZE) injection  200 mcg Intravenous Once   free water  300 mL Per Tube Q4H   furosemide  60 mg Intravenous BID   guaiFENesin  30 mL Per Tube QID   hydrALAZINE  25 mg Per Tube TID   insulin aspart  0-15 Units Subcutaneous Q4H   mouth rinse  15 mL Mouth Rinse 10 times per day   methylPREDNISolone (SOLU-MEDROL) injection  40 mg Intravenous Q12H   pantoprazole (PROTONIX) IV  40 mg Intravenous QHS   QUEtiapine  25 mg Per Tube BID   rosuvastatin  10 mg Per Tube Daily   sennosides  5 mL Per Tube BID   sodium chloride flush  10-40 mL Intracatheter Q12H   sodium chloride flush  3 mL Intravenous Q12H   Continuous Infusions:  sodium chloride Stopped (05/26/21 0138)   albumin human     amiodarone 30 mg/hr (05/27/21 0600)   bivalirudin (ANGIOMAX) infusion 0.5 mg/mL (Non-ACS indications) 0.046 mg/kg/hr (05/26/21 2135)   dexmedetomidine (PRECEDEX) IV infusion Stopped (05/27/21 0533)   feeding supplement (VITAL 1.5 CAL) 1,000 mL (05/26/21 1606)  fentaNYL infusion INTRAVENOUS 175 mcg/hr (05/27/21 0600)   meropenem (MERREM) IV Stopped (05/27/21 0217)   PRN Meds: Place/Maintain arterial line **AND** sodium chloride, acetaminophen (TYLENOL) oral liquid 160 mg/5 mL, albumin human, diazepam, fentaNYL (SUBLIMAZE) injection, fentaNYL (SUBLIMAZE) injection, hydrOXYzine, levalbuterol, ondansetron (ZOFRAN) IV, oxyCODONE, polyvinyl alcohol, sodium chloride flush   Vital Signs    Vitals:   05/27/21 0615 05/27/21 0630 05/27/21 0645 05/27/21 0700  BP:    (!) 124/55  Pulse: 92 77 78 85  Resp: (!) 24 (!) 24 (!) 24 (!) 24  Temp:       TempSrc:      SpO2: 98% 98% 99% 98%  Weight:      Height:        Intake/Output Summary (Last 24 hours) at 05/27/2021 0750 Last data filed at 05/27/2021 0700 Gross per 24 hour  Intake 3986.48 ml  Output 2040 ml  Net 1946.48 ml   Last 3 Weights 05/27/2021 05/26/2021 05/25/2021  Weight (lbs) 173 lb 1 oz 184 lb 1.4 oz 185 lb 6.5 oz  Weight (kg) 78.5 kg 83.5 kg 84.1 kg      Telemetry    NSR 60s-70s (personally reviewed)  Physical Exam   General: NAD Neck: JVP 8-9, RIJ Crescent, no thyromegaly or thyroid nodule.  Lungs: Crackles at bases.  CV: Nondisplaced PMI.  Heart regular S1/S2, no S3/S4, no murmur.  1+ edema to knees.  Abdomen: Soft, nontender, no hepatosplenomegaly, no distention.  Skin: Intact without lesions or rashes.  Neurologic: Follows commands.  Extremities: No clubbing or cyanosis.  HEENT: Normal.    Labs    High Sensitivity Troponin:   Recent Labs  Lab 04/23/2021 1321 04/20/2021 1518  TROPONINIHS 3 4     Chemistry Recent Labs  Lab 05/25/21 0344 05/25/21 0528 05/26/21 0328 05/26/21 9147 05/26/21 1557 05/26/21 1603 05/27/21 0157 05/27/21 0350 05/27/21 0546  NA 147*   < > 150*   150*   < > 151*   < > 150* 150* 150*  K 4.1   < > 4.3   4.2   < > 4.6   < > 4.6 4.7 4.4  CL 105   < > 109  --  110  --   --  114*  --   CO2 35*   < > 35*  --  33*  --   --  31  --   GLUCOSE 173*   < > 181*  --  202*  --   --  185*  --   BUN 58*   < > 56*  --  57*  --   --  55*  --   CREATININE 1.17   < > 1.10  --  1.08  --   --  1.02  --   CALCIUM 7.8*   < > 7.8*  --  8.3*  --   --  7.9*  --   MG 2.1  --  2.2  --   --   --   --  2.3  --   PROT 4.4*  --  4.0*  --   --   --   --  4.2*  --   ALBUMIN 2.0*  --  1.9*  --   --   --   --  2.0*  --   AST 165*  --  196*  --   --   --   --  86*  --   ALT 274*  --  387*  --   --   --   --  306*  --   ALKPHOS 155*  --  189*  --   --   --   --  185*  --   BILITOT 0.7  --  1.9*  --   --   --   --  1.3*  --   GFRNONAA >60   < > >60  --   >60  --   --  >60  --   ANIONGAP 7   < > 6  --  8  --   --  5  --    < > = values in this interval not displayed.    Lipids  No results for input(s): CHOL, TRIG, HDL, LABVLDL, LDLCALC, CHOLHDL in the last 168 hours.   Hematology Recent Labs  Lab 05/26/21 0328 05/26/21 1610 05/26/21 1557 05/26/21 1603 05/27/21 0157 05/27/21 0350 05/27/21 0546  WBC 11.9*  --  15.0*  --   --  12.8*  --   RBC 3.29*  --  3.90*  --   --  3.50*  --   HGB 9.5*   9.2*   < > 11.0*   < > 10.5* 9.9* 10.9*  HCT 30.2*   27.0*   < > 36.3*   < > 31.0* 32.9* 32.0*  MCV 91.8  --  93.1  --   --  94.0  --   MCH 28.9  --  28.2  --   --  28.3  --   MCHC 31.5  --  30.3  --   --  30.1  --   RDW 16.1*  --  16.4*  --   --  16.4*  --   PLT 192  --  232  --   --  191  --    < > = values in this interval not displayed.   Thyroid No results for input(s): TSH, FREET4 in the last 168 hours.  BNPNo results for input(s): BNP, PROBNP in the last 168 hours.  DDimer No results for input(s): DDIMER in the last 168 hours.   Radiology    DG Chest Port 1 View  Result Date: 05/26/2021 CLINICAL DATA:  Status post tracheostomy.  On ECMO. EXAM: PORTABLE CHEST 1 VIEW COMPARISON:  Radiographs earlier the same date, 05/25/2021 and 05/24/2021. CT 06/02/2017. FINDINGS: 1118 hours. Interval tracheostomy. The tracheostomy is well positioned. Feeding tube, left subclavian central venous catheter and ECMO apparatus appear unchanged. The heart size and mediastinal contours are stable status post median sternotomy and CABG. Further slight improvement in pulmonary edema with chronic biapical scarring. No evidence of pneumothorax or significant pleural effusion. There is a mildly displaced fracture of the right 2nd rib which appears unchanged. No new fractures are identified. IMPRESSION: 1. Satisfactory position of the tracheostomy. No pneumothorax. Additional components of the support system appear unchanged. 2. Further slight improvement in pulmonary  edema. Electronically Signed   By: Richardean Sale M.D.   On: 05/26/2021 11:27   DG CHEST PORT 1 VIEW  Result Date: 05/26/2021 CLINICAL DATA:  Personal history of ECMO Z92.81 (ICD-10-CM) ARDS (adult respiratory distress syndrome) (HCC) J80 (ICD-10-CM) EXAM: PORTABLE CHEST 1 VIEW COMPARISON:  May 25, 2021. FINDINGS: ET tube tip at the level of the clavicular heads, unchanged. Enteric tube courses below the diaphragm and outside the field of view. Similar position of a left subclavian approach central venous catheter. Partially imaged ECMO cannula. Slightly improved aeration of the lungs with persistent diffuse airspace and interstitial opacities. Probable trace bilateral pleural effusions. No visible  pneumothorax on this semi erect radiograph. Similar cardiomediastinal silhouette. IMPRESSION: 1. Slightly improved aeration of the lungs with persistent diffuse airspace and interstitial opacities. 2. Similar position of lines/tubes. Electronically Signed   By: Margaretha Sheffield M.D.   On: 05/26/2021 08:16   [place] EKG SITE RITE  Result Date: 05/26/2021 If [facility] image not attached, placement could not be confirmed due to current cardiac rhythm.   Cardiac Studies   Cath 04/22/2021 Distal left main [place] 111 bifurcation stenosis with 75% left main, 90% ostial to proximal LAD, and 80-90% ostial circumflex (difficult to assess due to heavy calcification). Severe mid circumflex disease with 70% eccentric mid stenosis and second obtuse marginal containing ostial to proximal greater than 80% stenosis.  (Bifurcation [place] 111 Severe calcification in left main and LAD in particular with diffuse 50% narrowing from proximal to mid vessel and tandem 70% stenoses in the mid LAD. Nondominant right coronary Normal LV function.  EF 55%.  LVEDP normal.    Patient Profile     71 y.o. male with PMH of PVCs presented with chest pain. Cardiac cath by Dr. Tamala Julian on 05/19/2021 showed 75% left main, 90% ost to prox LAD,  80-90% ost LCx, 70% mid LCx, 80% OM2, 50% prox to mid LAD, 70% mid LAD, EF 55%. Patient underwent CABG x 3 on 04/24/2021. Post op course complicated afib, treated with amio. CXR showed opacity in bilateral lung, started abx on 1/25 and diuretic. Started on Eliquis due to recurrence of afib.    Assessment & Plan    1. CAD: Admitted with unstable angina, cath with severe left main and proximal LAD/LCx disease (nondominant RCA).  CABG x 3 on 1/18 with LIMA-LAD, SVG-OM, SVG-left PDA.  No s/s angina  - Continue ASA 81, statin.  2. 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 RHC 1/30 with equalization of diastolic pressures.  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  Repeated echo 2/2 still showed respirophasic septal variation but not as impressive. CVP 8-9 today with I/Os positive, co-ox 74%.  - Acetazolamide 250 mg bid again today and will give Lasix 60 mg IV bid, keep I/Os even to mildly negative.  - With concern for development of post-surgical effusive/constrictive pericarditis, he is on colchicine.  - ?need for surgical intervention on effusive/constrictive pericarditis. Will need improvement of lung disease first then reassess, repeat echo not as impressive.   3. 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  Suspect primarily septic/vasodilatory shock. He is currently off vasopressin and NE with stable MAP. Lactate normal.  - Continue midodrine 5 mg tid.   4. Hiccups: Had been intractable, now seem resolved.  5. PNA: Of note, he does appear to have  had some pre-existing ILD from 2019 CT chest (?sarcoidosis). CXR with persistent bilateral infiltrates, possible mild improvement compared to yesterday.  Have thought most likely aspiration PNA/pneumonitis in setting of intractable hiccups. He has developred ARDS.  Afebrile. COVID was negative.  FiO2 0.5 on vent.  We have struggled to wean sweep, at 5.5 today. CXR similar with bilateral diffuse airspace disease. Now with tracheostomy.  - Will try again to wean sweep.  - Discussed with pulmonary, think amiodarone toxicity  unlikely as he has only been on it post-op.  - Completed vancomycin, remains on meropenem.  - Vent per CCM.  - Solumedrol 40 mg IV bid, ESR now normal.  - ECMO cannula is positional, last ABG was good.  Will watch for now and try to externally reposition.  Could probably pull back cannula a bit but with good ABG currently I am reticent just yet.  6. Atypical atrial flutter/PVCs:  Remains in NSR - Continue amiodarone 30 mg/hr. - Bivalirudin gtt, goal PTT 50-80.  Discussed dosing with PharmD personally. 7. AKI: Creatinine 1.87 => 1.69 => 1.3 => 1.34 => 1.17 => 1.10 => 1.02.  Follow closely.  8. Elevated LFTs: Suspect shock liver, follow CMET.  LFTs trending down.  9. Anemia: Stable hgb at 10.9. 10. Hypernatremia: Increase free water to 300 cc q4 hrs.   CRITICAL CARE Performed by: Loralie Champagne  Total critical care time: 40 minutes  Critical care time was exclusive of separately billable procedures and treating other patients.  Critical care was necessary to treat or prevent imminent or life-threatening deterioration.  Critical care was time spent personally by me on the following activities: development of treatment plan with patient and/or surrogate as well as nursing, discussions with consultants, evaluation of patient's response to treatment, examination of patient, obtaining history from patient or surrogate, ordering and performing treatments and interventions, ordering  and review of laboratory studies, ordering and review of radiographic studies, pulse oximetry and re-evaluation of patient's condition.  Loralie Champagne MD 05/27/2021 7:50 AM

## 2021-05-27 NOTE — Progress Notes (Signed)
Patient ID: CHOYA TORNOW, male   DOB: Aug 15, 1950, 71 y.o.   MRN: [id number]  Extracorporeal support note   ECLS support day: 6 Indication: ARDS  Configuration: VV  Drainage cannula: Crescent R IJ Return cannula: Crescent R IJ  Pump speed: 2900 rpm Pump flow: 3.5 L/min Pump used: Cardiohelp  Sweep gas: 5.5   Circuit check: Small amount thrombus. Good color change. LDH stable.  Anticoagulant: Bivalirudin, PTT goal 50-80  Changes in support: Continue current support. Diurese. Vent adjusted per CCM.   Anticipated goals/duration of support: Wean to discontinuation  Loralie Champagne, MD  7:48 AM

## 2021-05-27 NOTE — Progress Notes (Deleted)
Cardiology Office Note:    Date:  05/27/2021   ID:  Randall Hodges, DOB 07-24-1950, MRN [id number]  PCP:  [name], MD [place] Cardiologist: [name], MD   Reason for visit: Hospital follow-up post CABG  History of Present Illness:    Randall Hodges is a 71 y.o. male with a hx of PVCs, coronary artery disease    Past Medical History:  Diagnosis Date   Allergic rhinitis    Prostate cancer [facility])     Past Surgical History:  Procedure Laterality Date   CORONARY ARTERY BYPASS GRAFT N/A 04/21/2021   Procedure: CORONARY ARTERY BYPASS GRAFTING (CABG) x3 on pump using left internal mammary artery and right endoscopic greater saphenous vein conduit.;  Surgeon: Dahlia Byes, MD;  Location: [place];  Service: Open Heart Surgery;  Laterality: N/A;   ECMO CANNULATION Bilateral 06/14/2021   Procedure: ECMO CANNULATION;  Surgeon: Larey Dresser, MD;  Location: [place] CV LAB;  Service: Cardiovascular;  Laterality: Bilateral;   ENDOVEIN HARVEST OF GREATER SAPHENOUS VEIN Right 04/30/2021   Procedure: ENDOVEIN HARVEST OF GREATER SAPHENOUS VEIN;  Surgeon: Dahlia Byes, MD;  Location: [place];  Service: Open Heart Surgery;  Laterality: Right;   LEFT HEART CATH AND CORONARY ANGIOGRAPHY N/A 04/25/2021   Procedure: LEFT HEART CATH AND CORONARY ANGIOGRAPHY;  Surgeon: Belva Crome, MD;  Location: [place] [facility];  Service: Cardiovascular;  Laterality: N/A;   PROSTATE BIOPSY     RIGHT/LEFT HEART CATH AND CORONARY ANGIOGRAPHY N/A 05/17/2021   Procedure: RIGHT/LEFT HEART CATH AND CORONARY ANGIOGRAPHY;  Surgeon: Larey Dresser, MD;  Location: [place] [facility];  Service: Cardiovascular;  Laterality: N/A;   SHOULDER SURGERY     TEE WITHOUT CARDIOVERSION N/A 05/11/2021   Procedure: TRANSESOPHAGEAL ECHOCARDIOGRAM (TEE);  Surgeon: Dahlia Byes, MD;  Location: [place];  Service: Open Heart Surgery;  Laterality: N/A;    Current Medications: No outpatient medications have  been marked as taking for the 05/28/21 encounter (Appointment) with Warren Lacy, PA-C.     Allergies:   Amoxicillin-pot clavulanate, Atorvastatin, Plant derived enzymes, and Trichophyton   Social History   Socioeconomic History   Marital status: Married    Spouse name: Not on file   Number of children: 2   Years of education: Not on file   Highest education level: Not on file  Occupational History    Comment: full time   Tobacco Use   Smoking status: Never   Smokeless tobacco: Never  Vaping Use   Vaping Use: Never used  Substance and Sexual Activity   Alcohol use: Yes    Comment: occ   Drug use: Never   Sexual activity: Not on file  Other Topics Concern   Not on file  Social History Narrative   Not on file   Social Determinants of Health   Financial Resource Strain: Not on file  Food Insecurity: Not on file  Transportation Needs: Not on file  Physical Activity: Not on file  Stress: Not on file  Social Connections: Not on file     Family History: The patient's family history includes Diabetes in his father; Hypertension in his paternal grandmother; Prostate cancer (age of onset: 97) in his father; Prostate cancer (age of onset: 41) in his brother. There is no history of Breast cancer, Colon cancer, or Pancreatic cancer.  ROS:   Please see the history of present illness.     EKGs/Labs/Other Studies Reviewed:    EKG:  The ekg ordered today demonstrates ***  Recent Labs: 05/27/2021: ALT 306; BUN 55; Creatinine, Ser 1.02; Hemoglobin 10.9; Magnesium 2.3; Platelets 191; Potassium 4.4; Sodium 150   Recent Lipid Panel Lab Results  Component Value Date/Time   CHOL 71 05/13/2021 12:56 AM   TRIG 56 05/13/2021 12:56 AM   HDL 25 (L) 05/13/2021 12:56 AM   LDLCALC 35 05/13/2021 12:56 AM    Physical Exam:    VS:  There were no vitals taken for this visit.   No data found.  Wt Readings from Last 3 Encounters:  05/27/21 173 lb 1 oz (78.5 kg)  04/30/21 175 lb 6.4  oz (79.6 kg)  03/24/21 175 lb (79.4 kg)     GEN: *** Well nourished, well developed in no acute distress HEENT: Normal NECK: No JVD; No carotid bruits CARDIAC: ***RRR, no murmurs, rubs, gallops RESPIRATORY:  Clear to auscultation without rales, wheezing or rhonchi  ABDOMEN: Soft, non-tender, non-distended MUSCULOSKELETAL: No edema; No deformity  SKIN: Warm and dry NEUROLOGIC:  Alert and oriented PSYCHIATRIC:  Normal affect     ASSESSMENT AND PLAN   ***   {Are you ordering a CV Procedure (e.g. stress test, cath, DCCV, TEE, etc)?   Press F2        :[id number]}    Medication Adjustments/Labs and Tests Ordered: Current medicines are reviewed at length with the patient today.  Concerns regarding medicines are outlined above.  No orders of the defined types were placed in this encounter.  No orders of the defined types were placed in this encounter.   There are no Patient Instructions on file for this visit.   Signed, Warren Lacy, PA-C  05/27/2021 2:51 PM     [facility]

## 2021-05-27 NOTE — Progress Notes (Addendum)
[address]       [place] [address]             [phone]    21 Days Post--Op CABGx 3   6 Days Post-Op Procedure(s) (LRB): ECMO CANNULATION (Bilateral) Subjective:  Percutaneous trach placement yesterday, remains on vent and V-V ECMO with primary mgt per [facility] and CCM.  Currently not responsive but reported to have periods of wakefulness and will follow commands.     Objective: Vital signs in last 24 hours: Temp:  [98.1 F (36.7 C)-98.4 F (36.9 C)] 98.2 F (36.8 C) (02/08 0800) Pulse Rate:  [72-126] 124 (02/08 0806) Cardiac Rhythm: Sinus tachycardia (02/08 0800) Resp:  [0-35] 22 (02/08 0806) BP: (94-178)/(54-89) 132/70 (02/08 0800) SpO2:  [93 %-100 %] 99 % (02/08 0806) Arterial Line BP: (127-256)/(49-107) 160/70 (02/08 0806) FiO2 (%):  [50 %] 50 % (02/08 0806) Weight:  [78.5 kg] 78.5 kg (02/08 0500)  Hemodynamic parameters for last 24 hours: CVP:  [5 mmHg-9 mmHg] 7 mmHg  Intake/Output from previous day: 02/07 0701 - 02/08 0700 In: 3986.5 [I.V.:1096.3; NG/GT:2560; IV Piggyback:300.1] Out: 2040 [Urine:1690; Stool:350] Intake/Output this shift: Total I/O In: 224.9 [I.V.:84.9; NG/GT:140] Out: 150 [Urine:150]  General appearance: Bed in sitting position, awake but not responsive. On Precedex Neurologic: unable to assess now, reported to have episodic wakefulness and will MAE. Heart: Currently in SR but had PAF this morning that has quieted with an additional amiodarone bolus.  Lungs: breath sounds are coarse. CXR showing bilateral pulmonary opacities that are stable for past several days but overall aeration is much better than a week ago.  Abdomen: soft, no apparent tenderness. Has CorTrak feeding tube and FlexSeal in place  Extremities: all warm with adequate perfusion. Mottling noted left forearm Wound: the sternotomy incision is well healed.  Lab Results: Recent Labs    05/26/21 1557 05/26/21 1603 05/27/21 0350 05/27/21 0546   WBC 15.0*  --  12.8*  --   HGB 11.0*   < > 9.9* 10.9*  HCT 36.3*   < > 32.9* 32.0*  PLT 232  --  191  --    < > = values in this interval not displayed.   BMET:  Recent Labs    05/26/21 1557 05/26/21 1603 05/27/21 0350 05/27/21 0546  NA 151*   < > 150* 150*  K 4.6   < > 4.7 4.4  CL 110  --  114*  --   CO2 33*  --  31  --   GLUCOSE 202*  --  185*  --   BUN 57*  --  55*  --   CREATININE 1.08  --  1.02  --   CALCIUM 8.3*  --  7.9*  --    < > = values in this interval not displayed.    PT/INR:  Recent Labs    05/27/21 0350  LABPROT 23.7*  INR 2.1*   ABG    Component Value Date/Time   PHART 7.361 05/27/2021 0546   HCO3 32.3 (H) 05/27/2021 0546   TCO2 34 (H) 05/27/2021 0546   ACIDBASEDEF 0.6 05/26/2021 0950   O2SAT 98.0 05/27/2021 0546   CBG (last 3)  Recent Labs    05/26/21 1942 05/27/21 0008 05/27/21 0347  GLUCAP 156* 183* 169*    Assessment/Plan: S/P Procedure(s) (LRB): ECMO CANNULATION (Bilateral)  -POD 21 CABG x 3 complicated by aspiration pneumonia, heart failure and shock. Stable on ECMO and vent. S/P percutaneous trach  yesterday. Primary mgt per HF and CCM.   -Post-op atrial fibrillation and atrial flutter- On amiodarone infusion, received additional IV bolus of amio this am for PAF. Currently SR.   -PULM- aspiration pneumonia, on day 7 meropenem. Oxygenating well on fiO2 0.5.  CXR slowly improving.   -GI-tolerating TF at goal per CorTrak. FlexSeal in place.  LFT's trending down.  -ENDO- mild hyperglycemia while receiving daily IV Solu-Medrol.  Managed with SSI  -RENAL- AKI resolving.       LOS: 22 days    Antony Odea, PA-C 05/27/2021 Surgical incisions clean, dry Trach in place to assist with ECMO wean Sweep of 4L needed to manage PCO2  patient examined and medical record reviewed,agree with above note. Dahlia Byes 05/27/2021

## 2021-05-28 ENCOUNTER — Ambulatory Visit: Payer: PPO | Admitting: Physician Assistant

## 2021-05-28 ENCOUNTER — Inpatient Hospital Stay (HOSPITAL_COMMUNITY): Payer: PPO

## 2021-05-28 DIAGNOSIS — I249 Acute ischemic heart disease, unspecified: Secondary | ICD-10-CM | POA: Diagnosis not present

## 2021-05-28 DIAGNOSIS — J9601 Acute respiratory failure with hypoxia: Secondary | ICD-10-CM | POA: Diagnosis not present

## 2021-05-28 LAB — COOXEMETRY PANEL
Carboxyhemoglobin: 1.2 % (ref 0.5–1.5)
Methemoglobin: 1.1 % (ref 0.0–1.5)
O2 Saturation: 83.4 %
Total hemoglobin: 10.1 g/dL — ABNORMAL LOW (ref 12.0–16.0)

## 2021-05-28 LAB — POCT I-STAT 7, (LYTES, BLD GAS, ICA,H+H)
Acid-Base Excess: 7 mmol/L — ABNORMAL HIGH (ref 0.0–2.0)
Acid-Base Excess: 9 mmol/L — ABNORMAL HIGH (ref 0.0–2.0)
Acid-Base Excess: 8 mmol/L — ABNORMAL HIGH (ref 0.0–2.0)
Acid-Base Excess: 9 mmol/L — ABNORMAL HIGH (ref 0.0–2.0)
Bicarbonate: 34.1 mmol/L — ABNORMAL HIGH (ref 20.0–28.0)
Bicarbonate: 36.1 mmol/L — ABNORMAL HIGH (ref 20.0–28.0)
Bicarbonate: 35 mmol/L — ABNORMAL HIGH (ref 20.0–28.0)
Bicarbonate: 35.7 mmol/L — ABNORMAL HIGH (ref 20.0–28.0)
Calcium, Ion: 1.21 mmol/L (ref 1.15–1.40)
Calcium, Ion: 1.18 mmol/L (ref 1.15–1.40)
Calcium, Ion: 1.22 mmol/L (ref 1.15–1.40)
Calcium, Ion: 1.21 mmol/L (ref 1.15–1.40)
HCT: 31 % — ABNORMAL LOW (ref 39.0–52.0)
HCT: 31 % — ABNORMAL LOW (ref 39.0–52.0)
HCT: 30 % — ABNORMAL LOW (ref 39.0–52.0)
HCT: 29 % — ABNORMAL LOW (ref 39.0–52.0)
Hemoglobin: 10.5 g/dL — ABNORMAL LOW (ref 13.0–17.0)
Hemoglobin: 10.5 g/dL — ABNORMAL LOW (ref 13.0–17.0)
Hemoglobin: 10.2 g/dL — ABNORMAL LOW (ref 13.0–17.0)
Hemoglobin: 9.9 g/dL — ABNORMAL LOW (ref 13.0–17.0)
O2 Saturation: 99 %
O2 Saturation: 100 %
O2 Saturation: 99 %
O2 Saturation: 100 %
Patient temperature: 36.9
Patient temperature: 36.9
Patient temperature: 98.4
Patient temperature: 99.3
Potassium: 4.6 mmol/L (ref 3.5–5.1)
Potassium: 4.4 mmol/L (ref 3.5–5.1)
Potassium: 4.2 mmol/L (ref 3.5–5.1)
Potassium: 4.2 mmol/L (ref 3.5–5.1)
Sodium: 148 mmol/L — ABNORMAL HIGH (ref 135–145)
Sodium: 149 mmol/L — ABNORMAL HIGH (ref 135–145)
Sodium: 149 mmol/L — ABNORMAL HIGH (ref 135–145)
Sodium: 149 mmol/L — ABNORMAL HIGH (ref 135–145)
TCO2: 36 mmol/L — ABNORMAL HIGH (ref 22–32)
TCO2: 38 mmol/L — ABNORMAL HIGH (ref 22–32)
TCO2: 37 mmol/L — ABNORMAL HIGH (ref 22–32)
TCO2: 37 mmol/L — ABNORMAL HIGH (ref 22–32)
pCO2 arterial: 61.2 mmHg — ABNORMAL HIGH (ref 32.0–48.0)
pCO2 arterial: 66.2 mmHg (ref 32.0–48.0)
pCO2 arterial: 64 mmHg — ABNORMAL HIGH (ref 32.0–48.0)
pCO2 arterial: 61.1 mmHg — ABNORMAL HIGH (ref 32.0–48.0)
pH, Arterial: 7.353 (ref 7.350–7.450)
pH, Arterial: 7.345 — ABNORMAL LOW (ref 7.350–7.450)
pH, Arterial: 7.346 — ABNORMAL LOW (ref 7.350–7.450)
pH, Arterial: 7.376 (ref 7.350–7.450)
pO2, Arterial: 131 mmHg — ABNORMAL HIGH (ref 83.0–108.0)
pO2, Arterial: 387 mmHg — ABNORMAL HIGH (ref 83.0–108.0)
pO2, Arterial: 140 mmHg — ABNORMAL HIGH (ref 83.0–108.0)
pO2, Arterial: 294 mmHg — ABNORMAL HIGH (ref 83.0–108.0)

## 2021-05-28 LAB — HEPATIC FUNCTION PANEL
ALT: 220 U/L — ABNORMAL HIGH (ref 0–44)
AST: 38 U/L (ref 15–41)
Albumin: 2.1 g/dL — ABNORMAL LOW (ref 3.5–5.0)
Alkaline Phosphatase: 151 U/L — ABNORMAL HIGH (ref 38–126)
Bilirubin, Direct: 0.3 mg/dL — ABNORMAL HIGH (ref 0.0–0.2)
Indirect Bilirubin: 0.6 mg/dL (ref 0.3–0.9)
Total Bilirubin: 0.9 mg/dL (ref 0.3–1.2)
Total Protein: 4.5 g/dL — ABNORMAL LOW (ref 6.5–8.1)

## 2021-05-28 LAB — BASIC METABOLIC PANEL
Anion gap: 7 (ref 5–15)
Anion gap: 5 (ref 5–15)
BUN: 56 mg/dL — ABNORMAL HIGH (ref 8–23)
BUN: 59 mg/dL — ABNORMAL HIGH (ref 8–23)
CO2: 32 mmol/L (ref 22–32)
CO2: 34 mmol/L — ABNORMAL HIGH (ref 22–32)
Calcium: 8 mg/dL — ABNORMAL LOW (ref 8.9–10.3)
Calcium: 8 mg/dL — ABNORMAL LOW (ref 8.9–10.3)
Chloride: 109 mmol/L (ref 98–111)
Chloride: 110 mmol/L (ref 98–111)
Creatinine, Ser: 1.04 mg/dL (ref 0.61–1.24)
Creatinine, Ser: 1 mg/dL (ref 0.61–1.24)
GFR, Estimated: >60 mL/min (ref >60)
GFR, Estimated: >60 mL/min (ref >60)
Glucose, Bld: 186 mg/dL — ABNORMAL HIGH (ref 70–99)
Glucose, Bld: 188 mg/dL — ABNORMAL HIGH (ref 70–99)
Potassium: 4.6 mmol/L (ref 3.5–5.1)
Potassium: 4.2 mmol/L (ref 3.5–5.1)
Sodium: 148 mmol/L — ABNORMAL HIGH (ref 135–145)
Sodium: 149 mmol/L — ABNORMAL HIGH (ref 135–145)

## 2021-05-28 LAB — CBC
HCT: 33.6 % — ABNORMAL LOW (ref 39.0–52.0)
HCT: 32 % — ABNORMAL LOW (ref 39.0–52.0)
Hemoglobin: 10.4 g/dL — ABNORMAL LOW (ref 13.0–17.0)
Hemoglobin: 9.5 g/dL — ABNORMAL LOW (ref 13.0–17.0)
MCH: 29.1 pg (ref 26.0–34.0)
MCH: 28.2 pg (ref 26.0–34.0)
MCHC: 31 g/dL (ref 30.0–36.0)
MCHC: 29.7 g/dL — ABNORMAL LOW (ref 30.0–36.0)
MCV: 93.9 fL (ref 80.0–100.0)
MCV: 95 fL (ref 80.0–100.0)
Platelets: 243 10*3/uL (ref 150–400)
Platelets: 223 10*3/uL (ref 150–400)
RBC: 3.58 MIL/uL — ABNORMAL LOW (ref 4.22–5.81)
RBC: 3.37 MIL/uL — ABNORMAL LOW (ref 4.22–5.81)
RDW: 16.7 % — ABNORMAL HIGH (ref 11.5–15.5)
RDW: 16.9 % — ABNORMAL HIGH (ref 11.5–15.5)
WBC: 17.4 10*3/uL — ABNORMAL HIGH (ref 4.0–10.5)
WBC: 16.3 10*3/uL — ABNORMAL HIGH (ref 4.0–10.5)
nRBC: 0.2 % (ref 0.0–0.2)
nRBC: 0.1 % (ref 0.0–0.2)

## 2021-05-28 LAB — APTT
aPTT: 47 seconds — ABNORMAL HIGH (ref 24–36)
aPTT: 47 seconds — ABNORMAL HIGH (ref 24–36)

## 2021-05-28 LAB — GLUCOSE, CAPILLARY
Glucose-Capillary: 180 mg/dL — ABNORMAL HIGH (ref 70–99)
Glucose-Capillary: 153 mg/dL — ABNORMAL HIGH (ref 70–99)
Glucose-Capillary: 139 mg/dL — ABNORMAL HIGH (ref 70–99)
Glucose-Capillary: 177 mg/dL — ABNORMAL HIGH (ref 70–99)
Glucose-Capillary: 151 mg/dL — ABNORMAL HIGH (ref 70–99)

## 2021-05-28 LAB — LACTIC ACID, PLASMA: Lactic Acid, Venous: 1.5 mmol/L (ref 0.5–1.9)

## 2021-05-28 LAB — FIBRINOGEN: Fibrinogen: 390 mg/dL (ref 210–475)

## 2021-05-28 LAB — PROTIME-INR
INR: 1.9 — ABNORMAL HIGH (ref 0.8–1.2)
Prothrombin Time: 21.9 seconds — ABNORMAL HIGH (ref 11.4–15.2)

## 2021-05-28 LAB — LACTATE DEHYDROGENASE: LDH: 327 U/L — ABNORMAL HIGH (ref 98–192)

## 2021-05-28 MED ORDER — FUROSEMIDE 10 MG/ML IJ SOLN
60.0000 mg | Freq: Once | INTRAMUSCULAR | Status: AC
  Administered 2021-05-28: 60 mg via INTRAVENOUS
  Filled 2021-05-28: qty 6

## 2021-05-28 MED ORDER — ALBUMIN HUMAN 25 % IV SOLN
25.0000 g | Freq: Once | INTRAVENOUS | Status: AC
  Administered 2021-05-28: 25 g via INTRAVENOUS
  Filled 2021-05-28: qty 100

## 2021-05-28 MED ORDER — ALBUMIN HUMAN 25 % IV SOLN: 25.0000 g | Freq: Four times a day (QID) | INTRAVENOUS | Status: DC

## 2021-05-28 MED ORDER — OXYCODONE HCL 5 MG PO TABS
10.0000 mg | ORAL_TABLET | Freq: Four times a day (QID) | ORAL | Status: DC | PRN
  Administered 2021-05-28 – 2021-05-31 (×7): 10 mg
  Filled 2021-05-28 (×7): qty 2

## 2021-05-28 NOTE — Progress Notes (Signed)
ANTICOAGULATION CONSULT NOTE  Pharmacy Consult for Bivalirudin Indication:  ECMO  Allergies  Allergen Reactions   Amoxicillin-Pot Clavulanate     Per pt report on 08/19/20, makes his urine dark colored   Atorvastatin Other (See Comments)     ( pt states causing runny nose, headaches, issue w/ urination)   Plant Derived Enzymes     Other reaction(s): Unknown   Trichophyton Other (See Comments)    Patient Measurements: Height: 5\' 10"  (177.8 cm) Weight: 80.4 kg (177 lb 4 oz) IBW/kg (Calculated) : 73  Vital Signs: Temp: 98.6 F (37 C) (02/09 0400) Temp Source: Core (02/09 0000) Pulse Rate: 80 (02/09 0630)  Labs: Recent Labs    05/26/21 0328 05/26/21 3007 05/27/21 0350 05/27/21 0546 05/27/21 1625 05/27/21 1633 05/27/21 1755 05/27/21 1953 05/28/21 0400  HGB 9.5*   9.2*   < > 9.9*   < > 10.5*   < > 11.2* 10.9* 10.5*   10.4*  HCT 30.2*   27.0*   < > 32.9*   < > 35.5*   < > 33.0* 32.0* 31.0*   33.6*  PLT 192   < > 191  --  258  --   --   --  243  APTT 53*   < > 49*  --  43*  --   --   --  47*  LABPROT 22.1*  --  23.7*  --   --   --   --   --  21.9*  INR 1.9*  --  2.1*  --   --   --   --   --  1.9*  CREATININE 1.10   < > 1.02  --  1.06  --   --   --  1.04   < > = values in this interval not displayed.     Estimated Creatinine Clearance: 67.3 mL/min (by C-G formula based on SCr of 1.04 mg/dL).   Medical History: Past Medical History:  Diagnosis Date   Allergic rhinitis    Prostate cancer ([place])     Medications:  Infusions:   sodium chloride Stopped (05/26/21 0138)   albumin human     albumin human     amiodarone 60 mg/hr (05/28/21 0600)   bivalirudin (ANGIOMAX) infusion 0.5 mg/mL (Non-ACS indications) 0.051 mg/kg/hr (05/28/21 0600)   dexmedetomidine (PRECEDEX) IV infusion Stopped (05/27/21 0533)   feeding supplement (VITAL 1.5 CAL) 1,000 mL (05/28/21 0345)   fentaNYL infusion INTRAVENOUS 75 mcg/hr (05/28/21 0600)   meropenem (MERREM) IV Stopped (05/28/21 0149)     Assessment: 71 year old male with coronary artery disease who initially presented with chest pain, noted to have multivessel coronary artery disease, he underwent CABG x3 on 04/20/2021, course was complicated with atrial fibrillation, frequent hiccups and aspiration pneumonia leading to hypoxia and increasing oxygen requirement. Patient now requiring VV ECMO. Pharmacy consulted for bivalirudin IV.   aPTT slightly low, trending back up but still below goal at 47 on current rate 0.051 mg/kg/hr. Hgb stable 10.4, plt WNL, LDH stable 300s. Fibrinogen stable 300-400. No issues with infusion or bleeding per RN. AKI noted resolving.  Goal of Therapy:  aPTT 50-80 seconds Monitor platelets by anticoagulation protocol: Yes   Plan:  Increase bivalirudin slightly to 0.058 mg/kg/hr Recheck aPTT at 1700 Monitor q12h CBC, closely for s/sx bleeding  Erin Hearing PharmD., BCPS Clinical Pharmacist 05/28/2021 7:47 AM

## 2021-05-28 NOTE — Progress Notes (Signed)
Patient ID: RIGGIN CUTTINO, male   DOB: May 02, 1950, 71 y.o.   MRN: [id number]   Progress Note  Patient Name: Randall Hodges Date of Encounter: 05/28/2021  [facility] Cardiologist: [name], MD   Subjective   2/2: VV ECMO initiation, Crescent catheter right IJ 2/7: Tracheostomy  Remains on vent. On precedex for sedation. Will wake up and follow commands. CVP 7-8 with mildly positive I/Os. Received acetazolamide 250 mg bid yesterday and Lasix 60 mg IV x 1.  He had chugging with Lasix, only received 1 dose and ECMO speed decreased. Co-ox 83%.  BP elevated, now on amlodipine and hydralazine.   He is in NSR on amiodarone gtt.  Bivalirudin gtt for ECMO circuit. No bleeding  LDH 615 -> 380 -> 428 -> 391 -> 354 -> 313 -> 327  Has CorTrak tube feeds.   Vent FiO2 0.5. CXR still with bilateral diffuse airspace disease, some improvement over time. Sweep weaning slowly.    ECMO: Speed 2500 rpm Flow 2.9 L/min pVen -51 DeltaP 12 Sweep 3.5 PTT 47 (goal 50-80) LDH 327 ABG 7.35/61/131/99%  Cardiac Studies: Echo (limited, 1/30): Echo reviewed, there is clear respirophasic variation of the interventricular septum and marked respirophasic variation of E inflow velocity on doppler evaluation of the mitral valve.  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  LV EF 45-50%.   RHC Procedural Findings (on norepinephrine 6): Hemodynamics (mmHg) RA mean 12 RV 37/12 PA 38/16, mean 27 PCWP mean 11 LV 108/12 AO 96/55 PAPI 1.8 Oxygen saturations: PA 54% AO 94% Cardiac Output (Fick) 5.59  Cardiac Index (Fick) 2.81  PVR 2.8 WU Simultaneous RV/LV tracings were obtained.  Difficult to interpret due to atrial fibrillation.  There was some suggestion of discordance (ventricular interdependence) but not  clear.  Inpatient Medications    Scheduled Meds:  sodium chloride   Intravenous Once   acetaZOLAMIDE  250 mg Per Tube BID   amLODipine  10 mg Per NG tube Daily   aspirin  81 mg Per Tube Daily   chlorhexidine gluconate (MEDLINE KIT)  15 mL Mouth Rinse BID   Chlorhexidine Gluconate Cloth  6 each Topical Daily   colchicine  0.6 mg Per Tube Daily   docusate  100 mg Per Tube BID   feeding supplement (PROSource TF)  45 mL Per Tube TID   fentaNYL (SUBLIMAZE) injection  200 mcg Intravenous Once   free water  300 mL Per Tube Q4H   furosemide  60 mg Intravenous Once   guaiFENesin  30 mL Per Tube QID   hydrALAZINE  25 mg Per Tube TID   insulin aspart  0-15 Units Subcutaneous Q4H   mouth rinse  15 mL Mouth Rinse 10 times per day   methylPREDNISolone (SOLU-MEDROL) injection  40 mg Intravenous Q12H   pantoprazole sodium  40 mg Per Tube Daily   QUEtiapine  25 mg Per Tube QHS   rosuvastatin  10 mg Per Tube Daily   sennosides  5 mL Per Tube BID   sodium chloride flush  10-40 mL Intracatheter Q12H   sodium chloride flush  10-40 mL Intracatheter Q12H   sodium chloride flush  3 mL Intravenous Q12H   Continuous Infusions:  sodium chloride Stopped (05/26/21 0138)   albumin human     albumin human     amiodarone 60 mg/hr (05/28/21 0600)   bivalirudin (ANGIOMAX)  infusion 0.5 mg/mL (Non-ACS indications) 0.051 mg/kg/hr (05/28/21 0600)   dexmedetomidine (PRECEDEX) IV infusion Stopped (05/27/21 0533)   feeding supplement (VITAL 1.5 CAL) 1,000 mL (05/28/21 0345)   fentaNYL infusion INTRAVENOUS 75 mcg/hr (05/28/21 0600)   meropenem (MERREM) IV Stopped (05/28/21 0149)   PRN Meds: Place/Maintain arterial line **AND** sodium chloride, acetaminophen (TYLENOL) oral liquid 160 mg/5 mL, albumin human, diazepam, fentaNYL (SUBLIMAZE) injection, hydrOXYzine, levalbuterol, ondansetron (ZOFRAN) IV, oxyCODONE, polyvinyl alcohol, sodium chloride flush   Vital Signs    Vitals:   05/28/21 0600 05/28/21 0615  05/28/21 0630 05/28/21 0741  BP:    (!) 130/57  Pulse: 82 79 80 83  Resp: (!) 21 (!) 24 (!) 22 (!) 24  Temp:      TempSrc:      SpO2: 99% 99% 99% 98%  Weight:      Height:        Intake/Output Summary (Last 24 hours) at 05/28/2021 0749 Last data filed at 05/28/2021 0600 Gross per 24 hour  Intake 3934.47 ml  Output 2760 ml  Net 1174.47 ml   Last 3 Weights 05/28/2021 05/27/2021 05/26/2021  Weight (lbs) 177 lb 4 oz 173 lb 1 oz 184 lb 1.4 oz  Weight (kg) 80.4 kg 78.5 kg 83.5 kg      Telemetry    NSR 60s-70s (personally reviewed)  Physical Exam   General: NAD, right IJ Crescent Neck: JVP 8 cm, no thyromegaly or thyroid nodule.  Lungs: Crackles at bases.  CV: Nondisplaced PMI.  Heart regular S1/S2, no S3/S4, no murmur.  1+ ankle edema.   Abdomen: Soft, nontender, no hepatosplenomegaly, no distention.  Skin: Intact without lesions or rashes.  Neurologic: Will follow commands Extremities: No clubbing or cyanosis.  HEENT: Normal.    Labs    High Sensitivity Troponin:   Recent Labs  Lab 05/11/2021 1321 05/16/2021 1518  TROPONINIHS 3 4     Chemistry Recent Labs  Lab 05/25/21 0344 05/25/21 0528 05/26/21 0328 05/26/21 2376 05/27/21 0350 05/27/21 0546 05/27/21 1625 05/27/21 1633 05/27/21 1755 05/27/21 1953 05/28/21 0400  NA 147*   < > 150*   150*   < > 150*   < > 151*   < > 150* 150* 148*   148*  K 4.1   < > 4.3   4.2   < > 4.7   < > 4.6   < > 4.5 4.4 4.6   4.6  CL 105   < > 109   < > 114*  --  112*  --   --   --  109  CO2 35*   < > 35*   < > 31  --  32  --   --   --  32  GLUCOSE 173*   < > 181*   < > 185*  --  182*  --   --   --  186*  BUN 58*   < > 56*   < > 55*  --  55*  --   --   --  56*  CREATININE 1.17   < > 1.10   < > 1.02  --  1.06  --   --   --  1.04  CALCIUM 7.8*   < > 7.8*   < > 7.9*  --  8.1*  --   --   --  8.0*  MG 2.1  --  2.2  --  2.3  --   --   --   --   --   --  PROT 4.4*  --  4.0*  --  4.2*  --   --   --   --   --  4.5*  ALBUMIN 2.0*  --  1.9*  --  2.0*   --   --   --   --   --  2.1*  AST 165*  --  196*  --  86*  --   --   --   --   --  38  ALT 274*  --  387*  --  306*  --   --   --   --   --  220*  ALKPHOS 155*  --  189*  --  185*  --   --   --   --   --  151*  BILITOT 0.7  --  1.9*  --  1.3*  --   --   --   --   --  0.9  GFRNONAA >60   < > >60   < > >60  --  >60  --   --   --  >60  ANIONGAP 7   < > 6   < > 5  --  7  --   --   --  7   < > = values in this interval not displayed.    Lipids  No results for input(s): CHOL, TRIG, HDL, LABVLDL, LDLCALC, CHOLHDL in the last 168 hours.   Hematology Recent Labs  Lab 05/27/21 0350 05/27/21 0546 05/27/21 1625 05/27/21 1633 05/27/21 1755 05/27/21 1953 05/28/21 0400  WBC 12.8*  --  17.9*  --   --   --  17.4*  RBC 3.50*  --  3.78*  --   --   --  3.58*  HGB 9.9*   < > 10.5*   < > 11.2* 10.9* 10.5*   10.4*  HCT 32.9*   < > 35.5*   < > 33.0* 32.0* 31.0*   33.6*  MCV 94.0  --  93.9  --   --   --  93.9  MCH 28.3  --  27.8  --   --   --  29.1  MCHC 30.1  --  29.6*  --   --   --  31.0  RDW 16.4*  --  16.7*  --   --   --  16.7*  PLT 191  --  258  --   --   --  243   < > = values in this interval not displayed.   Thyroid No results for input(s): TSH, FREET4 in the last 168 hours.  BNPNo results for input(s): BNP, PROBNP in the last 168 hours.  DDimer No results for input(s): DDIMER in the last 168 hours.   Radiology    DG CHEST PORT 1 VIEW  Result Date: 05/27/2021 CLINICAL DATA:  Tracheostomy.  ECMO. EXAM: PORTABLE CHEST 1 VIEW COMPARISON:  May 26, 2021. FINDINGS: Stable cardiomediastinal silhouette. Status post coronary bypass graft. Tracheostomy is in grossly good position. Feeding tube is seen entering stomach. ECMO device is noted. Left subclavian catheter is unchanged. Stable diffuse lung opacities are noted concerning for edema. Small pleural effusions may be present. No pneumothorax is noted. Mildly displaced right second rib fracture is noted. IMPRESSION: Stable support apparatus.  Stable bilateral opacities as described above. Electronically Signed   By: Marijo Conception M.D.   On: 05/27/2021 08:01   DG Chest Port 1 View  Result Date: 05/26/2021 CLINICAL DATA:  Status post tracheostomy.  On ECMO. EXAM: PORTABLE CHEST 1 VIEW COMPARISON:  Radiographs earlier the same date, 05/25/2021 and 05/24/2021. CT 06/02/2017. FINDINGS: 1118 hours. Interval tracheostomy. The tracheostomy is well positioned. Feeding tube, left subclavian central venous catheter and ECMO apparatus appear unchanged. The heart size and mediastinal contours are stable status post median sternotomy and CABG. Further slight improvement in pulmonary edema with chronic biapical scarring. No evidence of pneumothorax or significant pleural effusion. There is a mildly displaced fracture of the right 2nd rib which appears unchanged. No new fractures are identified. IMPRESSION: 1. Satisfactory position of the tracheostomy. No pneumothorax. Additional components of the support system appear unchanged. 2. Further slight improvement in pulmonary edema. Electronically Signed   By: Richardean Sale M.D.   On: 05/26/2021 11:27   [place] EKG SITE RITE  Result Date: 05/26/2021 If [facility] image not attached, placement could not be confirmed due to current cardiac rhythm.   Cardiac Studies   Cath 04/19/2021 Distal left main [place] 111 bifurcation stenosis with 75% left main, 90% ostial to proximal LAD, and 80-90% ostial circumflex (difficult to assess due to heavy calcification). Severe mid circumflex disease with 70% eccentric mid stenosis and second obtuse marginal containing ostial to proximal greater than 80% stenosis.  (Bifurcation [place] 111 Severe calcification in left main and LAD in particular with diffuse 50% narrowing from proximal to mid vessel and tandem 70% stenoses in the mid LAD. Nondominant right coronary Normal LV function.  EF 55%.  LVEDP normal.    Patient Profile     71 y.o. male with PMH of PVCs presented with  chest pain. Cardiac cath by Dr. Tamala Julian on 04/25/2021 showed 75% left main, 90% ost to prox LAD, 80-90% ost LCx, 70% mid LCx, 80% OM2, 50% prox to mid LAD, 70% mid LAD, EF 55%. Patient underwent CABG x 3 on 05/01/2021. Post op course complicated afib, treated with amio. CXR showed opacity in bilateral lung, started abx on 1/25 and diuretic. Started on Eliquis due to recurrence of afib.    Assessment & Plan    1. CAD: Admitted with unstable angina, cath with severe left main and proximal LAD/LCx disease (nondominant RCA).  CABG x 3 on 1/18 with LIMA-LAD, SVG-OM, SVG-left PDA.  No s/s angina  - Continue ASA 81, statin.  2. 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 RHC 1/30 with equalization of diastolic pressures.  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  Repeated echo 2/2 still showed respirophasic septal variation but not as impressive. CVP 8 today with I/Os mildly positive, co-ox 83%.  - Acetazolamide 250 mg bid again today and will give Lasix 60 mg IV x 1 along with albumin.  - With concern for development of post-surgical effusive/constrictive pericarditis, he is on colchicine.  - ?need for surgical intervention on effusive/constrictive pericarditis. Will need improvement of lung disease first then reassess, repeat echo not as impressive.   3. 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  Suspect primarily septic/vasodilatory shock. He is currently off vasopressin and NE with MAP now high. 4. Hiccups: Had been intractable, now seem resolved.  5.  PNA: Of note, he does appear to have had some  pre-existing ILD from 2019 CT chest (?sarcoidosis). CXR with persistent bilateral infiltrates, possible mild improvement compared to yesterday.  Have thought most likely aspiration PNA/pneumonitis in setting of intractable hiccups. He has developred ARDS.  Afebrile. COVID was negative.  FiO2 0.5 on vent.  We have struggled to wean sweep but down to 3.5 today. CXR similar with bilateral diffuse airspace disease. Now with tracheostomy.  - Continue slow sweep wean to get to the point where we can decannulate. - Discussed with pulmonary, think amiodarone toxicity unlikely as he has only been on it post-op.  - Completed vancomycin, remains on meropenem (finishes today).  - Vent per CCM.  - Solumedrol 40 mg IV bid, ESR now normal.  - ECMO cannula is positional, last ABG was good.  Will watch for now and try to externally reposition.  Could probably pull back cannula a bit but with good ABG currently would leave alone.  6. Atypical atrial flutter/PVCs:  Remains in NSR - Continue amiodarone 30 mg/hr. - Bivalirudin gtt, goal PTT 50-80.  Discussed dosing with PharmD personally. 7. AKI: Creatinine improved to 1.04.  Follow closely.  8. Elevated LFTs: Suspect shock liver, follow CMET.  LFTs trending down.  9. Anemia: Stable hgb at 10.4. 10. Hypernatremia: Continue free water 300 cc q4 hrs.  11. HTN: Now on amlodipine and hydralazine.  Whip in arterial line, going by cuff.   CRITICAL CARE Performed by: Loralie Champagne  Total critical care time: 40 minutes  Critical care time was exclusive of separately billable procedures and treating other patients.  Critical care was necessary to treat or prevent imminent or life-threatening deterioration.  Critical care was time spent personally by me on the following activities: development of treatment plan with patient and/or surrogate as well as nursing, discussions with consultants, evaluation of patient's response to  treatment, examination of patient, obtaining history from patient or surrogate, ordering and performing treatments and interventions, ordering and review of laboratory studies, ordering and review of radiographic studies, pulse oximetry and re-evaluation of patient's condition.  Loralie Champagne MD 05/28/2021 7:49 AM

## 2021-05-28 NOTE — Progress Notes (Signed)
Patient ID: Randall Hodges, male   DOB: 05-01-1950, 71 y.o.   MRN: [id number] Extracorporeal support note   ECLS support day: 7 Indication: ARDS  Configuration: VV  Drainage cannula: Crescent R IJ Return cannula: Crescent R IJ  Pump speed: 2500 rpm Pump flow: 2.9 L/min Pump used: Cardiohelp  Sweep gas: 3.5   Circuit check: Small amount thrombus. Good color change. LDH stable.  Anticoagulant: Bivalirudin, PTT goal 50-80  Changes in support: Wean sweep as able. Vent adjusted per CCM.   Anticipated goals/duration of support: Wean to discontinuation  Loralie Champagne, MD  7:48 AM

## 2021-05-28 NOTE — Progress Notes (Signed)
Occupational Therapy Treatment Patient Details Name: Randall Hodges MRN: [id number] DOB: 1950-06-14 Today's Date: 05/28/2021   History of present illness 71 yo male with CAD who initially presented with CP, noted to have multivessel CAD, he underwent CABG x3 on 05/02/2021, course was complicated with atrial fibrillation, frequent hiccups and aspiration PNA leading to hypoxia and increasing oxygen requirement. ETT 1/18-1/18. 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 Cortrak 1/30. 2/2 placed on VV ECMO. 2/7 trach. PMH: prostate CA   OT comments  Pt progressing towards established OT goals. Pt requiring Max A +2 for bed mobility. Pt tolerating sitting balance at EOB with Max A for ~10 minutes. Pt participating in PROM/AAROM exercises for BUE, scapula, and cervical spine. When at EOB, pt with increased arousal and engagement. Following ~25 of simple commands (such as "squeeze my hand"). VSS on vent via trach. RN and ECMO specialist present throughout. Continue to highly recommend dc to AIR and will continue to follow acutely as admitted.    Recommendations for follow up therapy are one component of a multi-disciplinary discharge planning process, led by the attending physician.  Recommendations may be updated based on patient status, additional functional criteria and insurance authorization.    Follow Up Recommendations  Acute inpatient rehab (3hours/day)    Assistance Recommended at Discharge Frequent or constant Supervision/Assistance  Patient can return home with the following  A lot of help with walking and/or transfers   Equipment Recommendations  Other (comment) (defer)    Recommendations for Other Services      Precautions / Restrictions Precautions Precautions: Fall;Sternal Precaution Booklet Issued: No Precaution Comments: ECMO, cortrak, foley, aline, sternal precautions, flexiseal, trach on vent Restrictions Other Position/Activity Restrictions: sternal precautions        Mobility Bed Mobility Overal bed mobility: Needs Assistance Bed Mobility: Rolling, Supine to Sit, Sit to Sidelying Rolling: Max assist, +2 for physical assistance   Supine to sit: Max assist, +2 for physical assistance, HOB elevated   Sit to sidelying: Max assist, +2 for physical assistance General bed mobility comments: Max A +2 to elevate trunk and bring BLES towards EOB. Use of bed pad to bring hips towards EOB. In returning to supine, log roll and Max A for sidelying and Max for rolling.    Transfers                   General transfer comment: Defer     Balance Overall balance assessment: Needs assistance Sitting-balance support: No upper extremity supported, Feet supported Sitting balance-Leahy Scale: Poor Sitting balance - Comments: Able to maintain sitting balance due to fatigue                                   ADL either performed or assessed with clinical judgement   ADL Overall ADL's : Needs assistance/impaired                                       General ADL Comments: total (A) for all adls at this time    Extremity/Trunk Assessment Upper Extremity Assessment Upper Extremity Assessment: Generalized weakness RUE Deficits / Details: Significant weakness. Able to perform hand squeezes. Initating scapular retractions. RUE Coordination: decreased fine motor;decreased gross motor LUE Deficits / Details: Significant weakness. Able to perform hand squeezes. Initating scapular retractions. LUE Coordination: decreased fine motor;decreased gross  motor   Lower Extremity Assessment Lower Extremity Assessment: Defer to PT evaluation        Vision       Perception     Praxis      Cognition Arousal/Alertness: Awake/alert Behavior During Therapy: Flat affect Overall Cognitive Status: Within Functional Limits for tasks assessed                                 General Comments: Following simple commands  ~25% of time - pt squeezing his face hand on cues. When sitting EOB, pt answering simple questions ~25% of time. Questions included "are you in pain?" and "ready?" Pt participating in cervical ROM exercises and scapular traction.        Exercises Exercises: Other exercises, General Lower Extremity, General Upper Extremity General Exercises - Upper Extremity Elbow Flexion: PROM, Both, 10 reps, Seated Elbow Extension: PROM, Both, 10 reps, Seated Wrist Flexion: PROM, Both, 10 reps, Seated Wrist Extension: PROM, Both, 10 reps, Seated Digit Composite Flexion: PROM, 10 reps, Both, Seated Composite Extension: PROM, Both, 10 reps, Seated General Exercises - Lower Extremity Ankle Circles/Pumps: Both, PROM, 10 reps, Supine Other Exercises Other Exercises: internal rotation of shoulder with elbow at 90* flexion. elbow kept at adduction. x10 Other Exercises: cervical ROM; AAROM; x5; seated at EOB Other Exercises: scapular retraction; seated at EOB; PROM; x5    Shoulder Instructions       General Comments SpO2 100% on vent via trach. HR 80s-90s.    Pertinent Vitals/ Pain       Pain Assessment Pain Assessment: Faces Faces Pain Scale: Hurts little more Pain Location: Grimacing with ROM Pain Descriptors / Indicators: Grimacing Pain Intervention(s): Monitored during session, Limited activity within patient's tolerance, Repositioned  Home Living                                          Prior Functioning/Environment              Frequency  Min 2X/week        Progress Toward Goals  OT Goals(current goals can now be found in the care plan section)  Progress towards OT goals: Progressing toward goals  Acute Rehab OT Goals OT Goal Formulation: Patient unable to participate in goal setting Time For Goal Achievement: 06/05/21 Potential to Achieve Goals: Good ADL Goals Pt Will Perform Grooming: with mod assist;sitting Pt Will Transfer to Toilet: with +2  assist;with max assist;stand pivot transfer;bedside commode Additional ADL Goal #1: pt will follow 1 step commands 50% of session Additional ADL Goal #2: pt will tolerate eob dangle 3 minutes with stable VSS with mod Support as precursor to adls  Plan Discharge plan remains appropriate    Co-evaluation                 AM-PAC OT "6 Clicks" Daily Activity     Outcome Measure   Help from another person eating meals?: Total Help from another person taking care of personal grooming?: Total Help from another person toileting, which includes using toliet, bedpan, or urinal?: Total Help from another person bathing (including washing, rinsing, drying)?: Total Help from another person to put on and taking off regular upper body clothing?: Total Help from another person to put on and taking off regular lower body clothing?: Total 6 Click Score: 6  End of Session Equipment Utilized During Treatment: Oxygen  OT Visit Diagnosis: Muscle weakness (generalized) (M62.81)   Activity Tolerance Patient limited by fatigue   Patient Left in bed;with call bell/phone within reach;with nursing/sitter in room;Other (comment) (ECMo team present x2 assisting)   Nurse Communication Mobility status;Precautions        Time: 7331-2508 OT Time Calculation (min): 27 min  Charges: OT General Charges $OT Visit: 1 Visit OT Treatments $Therapeutic Activity: 8-22 mins $Therapeutic Exercise: 8-22 mins  [place], OTR/L Acute Rehab Pager: [phone] Office: [place] 05/28/2021, 12:48 PM

## 2021-05-28 NOTE — Progress Notes (Signed)
NAME:  Randall Hodges, MRN:  [id number], DOB:  18-Mar-1951, LOS: 25 ADMISSION DATE:  04/21/2021, CONSULTATION DATE: 04/25/2021 REFERRING MD:  Dahlia Byes, MD, CHIEF COMPLAINT: Increasing shortness of breath  History of Present Illness:  71 year old male with coronary artery disease who initially presented with chest pain, noted to have multivessel coronary artery disease, he underwent CABG x3 on 04/1818 23, course was complicated with atrial fibrillation, frequent hiccups and aspiration pneumonia leading to hypoxia and increasing oxygen requirement. PCCM was consulted for evaluation and help with management  Patient stated hiccups are better but continued complain of shortness of breath, cough unable to bring up phlegm.  Denies chest pain or palpitation During my evaluation patient is on high flow nasal cannula oxygen at 12 L and he is requiring low-dose Levophed to maintain MAP above 65.  Of note patient had echocardiogram repeated today, consistent with constrictive picture.  Pertinent  Medical History   Past Medical History:  Diagnosis Date   Allergic rhinitis    Prostate cancer ([place])     Significant Hospital Events: Including procedures, antibiotic start and stop dates in addition to other pertinent events   1/18 CABG 1/30 PCCM consult, Worsening O2 needs, likely aspiration, new pressor need 2/2 intubation, placed on VV ECMO 2/5 issues with recirc, bronch neg, improved with supine positioning  Interim History / Subjective:  No significant events over night. Tolerating sweep 3.5  Objective   Blood pressure (!) 130/57, pulse 83, temperature 98.6 F (37 C), resp. rate (!) 24, height _0  (1.778 m), weight 80.4 kg, SpO2 98 %. CVP:  [5 mmHg-9 mmHg] 9 mmHg  Vent Mode: PCV FiO2 (%):  [50 %] 50 % Set Rate:  [22 bmp] 22 bmp Vt Set:  [150 mL] 150 mL PEEP:  [10 cmH20] 10 cmH20 Plateau Pressure:  [22 cmH20-31 cmH20] 22 cmH20   Intake/Output Summary (Last 24 hours) at 05/28/2021  6195 Last data filed at 05/28/2021 0600 Gross per 24 hour  Intake 3409.56 ml  Output 2610 ml  Net 799.56 ml    Filed Weights   05/26/21 0421 05/27/21 0500 05/28/21 0500  Weight: 83.5 kg 78.5 kg 80.4 kg    Examination: No distress on vent Lungs with crackles, mild bases Heart RRR without rubs or gallops Minimal edema Sternotomy scar well healed Will still wake up and follow commands with sedation wean Stable clot in oxygenator 12 oclock  Current drips: fent, amio, precedex, bival CVP 6  Lab review Renal function stable Sodium up Bicarb up LDH 1400>>615>>380>>428>>391>>354>>313>>327 CBG ok CBC stable, H/H responded to transfusion Lactate pending PTT 47 (50-80 goal) Fibrinogen continues to trend down 396>>390  CXR reduced opacity compared to previous CXR. Ca Corrected for albumin 9.5  Resolved Hospital Problem list   Hiccups postop Postop recurrent shock question constrictive pericaritis type physiology vs. Septic  Assessment & Plan:  CAD with unstable angina s/p CABGx3 1/18 (LIMA-LAD, SVG-OM, SVG-left PDA) Presumed aspiration pneumonitis vs. HCAP vs. ?AEILD culminating in severe ARDS with inability to ventilate along with high Aa gradient s/p VV ECMO cannulation 05/21/22.  CXR deteriorated between 1/26 and 1/29.  Slowly improving.  Repeat RVP/COVID neg.  BAL neg.  Sed rate improving. Need for [facility] for ECMO Atypical aflutter on amio Baseline fibrotic lung disease with preserved PFTs 2019, question of sarcoid- lost to f/u, see Dr. Matilde Bash note 08/15/17 AKI- multifactorial (sepsis, poor cardiac outflow, shock) improved Bilateral effusions- post CABG, R>L on bedside US (2/3), do not think would help vent wean  at this time to drain  - Continue sweep wean as able - continue abx, steroids as ordered - Albumin and diurese w/ lasix as allowed by ECMO circuit - hyroxyzine PRN for pain management - RT non urgent bronchoscopy to evaluate trache placement - update wife and  education on ARDS  Best Practice (right click and "Reselect all SmartList Selections" daily)   Diet/type: Vital @ 74m/hr DVT prophylaxis: Bival CBG: yes, SSI GI prophylaxis: PPI Lines: L [place] TLC, RIJ  Foley:  Yes, keep Code Status:  full code Last date of multidisciplinary goals of care discussion [daily at bedside]  34 min cc time DErskine EmeryMD PCCM

## 2021-05-28 NOTE — Procedures (Signed)
Bronchoscopy Procedure Note  Randall Hodges  [id number]  [birth year]  Date:05/28/21  Time:9:27 AM   Provider Performing:Yasmeen Manka C Tamala Julian   Procedure(s):  Flexible bronchoscopy with bronchial alveolar lavage [id number])  Indication(s) Check trach position, persistent pneumonitis  Consent Part of ECMO consent  Anesthesia Fent 117mg push   Time Out Verified patient identification, verified procedure, site/side was marked, verified correct patient position, special equipment/implants available, medications/allergies/relevant history reviewed, required imaging and test results available.   Sterile Technique Usual hand hygiene, masks, gowns, and gloves were used   Procedure Description Bronchoscope advanced through tracheostomy tube and into airway.  Airways were examined down to subsegmental level with findings noted below.   Following diagnostic evaluation, BAL(s) performed in RML with normal saline and return of mostly clear fluid  Findings:  - Trach tube abuts L tracheal wall, with some manipulation goes into center of trachea; should he have continued coughing issues a distal XLT or bivona may sit better - Minimal secretions - Normal appearing bronchi  Complications/Tolerance None; patient tolerated the procedure well. Chest X-ray is not needed post procedure.   EBL Minimal   Specimen(s) RML BAL: fungal/bacterial culture

## 2021-05-28 NOTE — Progress Notes (Addendum)
ANTICOAGULATION CONSULT NOTE  Pharmacy Consult for Bivalirudin Indication:  ECMO  Allergies  Allergen Reactions   Amoxicillin-Pot Clavulanate     Per pt report on 08/19/20, makes his urine dark colored   Atorvastatin Other (See Comments)     ( pt states causing runny nose, headaches, issue w/ urination)   Plant Derived Enzymes     Other reaction(s): Unknown   Trichophyton Other (See Comments)    Patient Measurements: Height: 5\' 10"  (177.8 cm) Weight: 80.4 kg (177 lb 4 oz) IBW/kg (Calculated) : 73  Vital Signs: Temp: 98.2 F (36.8 C) (02/09 1200) Temp Source: Core (02/09 1200) BP: 130/57 (02/09 0741) Pulse Rate: 79 (02/09 1600)  Labs: Recent Labs    05/26/21 0328 05/26/21 1761 05/27/21 0350 05/27/21 0546 05/27/21 1625 05/27/21 1633 05/28/21 0400 05/28/21 0957 05/28/21 1603  HGB 9.5*   9.2*   < > 9.9*   < > 10.5*   < > 10.5*   10.4* 10.5* 9.5*  HCT 30.2*   27.0*   < > 32.9*   < > 35.5*   < > 31.0*   33.6* 31.0* 32.0*  PLT 192   < > 191  --  258  --  243  --  223  APTT 53*   < > 49*  --  43*  --  47*  --  47*  LABPROT 22.1*  --  23.7*  --   --   --  21.9*  --   --   INR 1.9*  --  2.1*  --   --   --  1.9*  --   --   CREATININE 1.10   < > 1.02  --  1.06  --  1.04  --  1.00   < > = values in this interval not displayed.     Estimated Creatinine Clearance: 70 mL/min (by C-G formula based on SCr of 1 mg/dL).   Medical History: Past Medical History:  Diagnosis Date   Allergic rhinitis    Prostate cancer ([place])     Medications:  Infusions:   sodium chloride Stopped (05/26/21 0138)   albumin human     amiodarone 60 mg/hr (05/28/21 1600)   bivalirudin (ANGIOMAX) infusion 0.5 mg/mL (Non-ACS indications) 0.058 mg/kg/hr (05/28/21 1600)   dexmedetomidine (PRECEDEX) IV infusion Stopped (05/27/21 0533)   feeding supplement (VITAL 1.5 CAL) 1,000 mL (05/28/21 0345)   fentaNYL infusion INTRAVENOUS 50 mcg/hr (05/28/21 1600)    Assessment: 71 year old male with coronary  artery disease who initially presented with chest pain, noted to have multivessel coronary artery disease, he underwent CABG x3 on 05/10/2021, course was complicated with atrial fibrillation, frequent hiccups and aspiration pneumonia leading to hypoxia and increasing oxygen requirement. Patient now requiring VV ECMO. Pharmacy consulted for bivalirudin IV.   aPTT slightly low at 47, on current rate 0.058 mg/kg/hr. Hgb stable 9.5, plt WNL, LDH stable 300s. Fibrinogen stable 300-400. No issues with infusion or s/sx of bleeding per nursing.  Goal of Therapy:  aPTT 50-80 seconds Monitor platelets by anticoagulation protocol: Yes   Plan:  Increase bivalirudin slightly to 0.063 mg/kg/hr Recheck aPTT at 0500 Monitor q12h CBC, closely for s/sx bleeding  Antonietta Jewel, PharmD, [place] Pharmacist  Phone: [phone] 05/28/2021 5:34 PM  Please check AMION for all [place] phone numbers After 10:00 PM, call [place] [phone]

## 2021-05-29 ENCOUNTER — Inpatient Hospital Stay (HOSPITAL_COMMUNITY): Payer: PPO

## 2021-05-29 DIAGNOSIS — J9602 Acute respiratory failure with hypercapnia: Secondary | ICD-10-CM | POA: Diagnosis not present

## 2021-05-29 DIAGNOSIS — Z7189 Other specified counseling: Secondary | ICD-10-CM | POA: Diagnosis not present

## 2021-05-29 DIAGNOSIS — Z515 Encounter for palliative care: Secondary | ICD-10-CM | POA: Diagnosis not present

## 2021-05-29 DIAGNOSIS — J9601 Acute respiratory failure with hypoxia: Secondary | ICD-10-CM | POA: Diagnosis not present

## 2021-05-29 LAB — APTT
aPTT: 52 seconds — ABNORMAL HIGH (ref 24–36)
aPTT: 54 seconds — ABNORMAL HIGH (ref 24–36)

## 2021-05-29 LAB — POCT I-STAT 7, (LYTES, BLD GAS, ICA,H+H)
Acid-Base Excess: 7 mmol/L — ABNORMAL HIGH (ref 0.0–2.0)
Acid-Base Excess: 9 mmol/L — ABNORMAL HIGH (ref 0.0–2.0)
Acid-Base Excess: 10 mmol/L — ABNORMAL HIGH (ref 0.0–2.0)
Bicarbonate: 34.6 mmol/L — ABNORMAL HIGH (ref 20.0–28.0)
Bicarbonate: 34.9 mmol/L — ABNORMAL HIGH (ref 20.0–28.0)
Bicarbonate: 35.6 mmol/L — ABNORMAL HIGH (ref 20.0–28.0)
Calcium, Ion: 1.19 mmol/L (ref 1.15–1.40)
Calcium, Ion: 1.22 mmol/L (ref 1.15–1.40)
Calcium, Ion: 1.22 mmol/L (ref 1.15–1.40)
HCT: 28 % — ABNORMAL LOW (ref 39.0–52.0)
HCT: 28 % — ABNORMAL LOW (ref 39.0–52.0)
HCT: 26 % — ABNORMAL LOW (ref 39.0–52.0)
Hemoglobin: 9.5 g/dL — ABNORMAL LOW (ref 13.0–17.0)
Hemoglobin: 9.5 g/dL — ABNORMAL LOW (ref 13.0–17.0)
Hemoglobin: 8.8 g/dL — ABNORMAL LOW (ref 13.0–17.0)
O2 Saturation: 99 %
O2 Saturation: 98 %
O2 Saturation: 97 %
Patient temperature: 98.5
Patient temperature: 36.8
Patient temperature: 36.9
Potassium: 4.5 mmol/L (ref 3.5–5.1)
Potassium: 4.1 mmol/L (ref 3.5–5.1)
Potassium: 4.1 mmol/L (ref 3.5–5.1)
Sodium: 148 mmol/L — ABNORMAL HIGH (ref 135–145)
Sodium: 148 mmol/L — ABNORMAL HIGH (ref 135–145)
Sodium: 149 mmol/L — ABNORMAL HIGH (ref 135–145)
TCO2: 37 mmol/L — ABNORMAL HIGH (ref 22–32)
TCO2: 37 mmol/L — ABNORMAL HIGH (ref 22–32)
TCO2: 37 mmol/L — ABNORMAL HIGH (ref 22–32)
pCO2 arterial: 64.5 mmHg — ABNORMAL HIGH (ref 32.0–48.0)
pCO2 arterial: 57.1 mmHg — ABNORMAL HIGH (ref 32.0–48.0)
pCO2 arterial: 56.2 mmHg — ABNORMAL HIGH (ref 32.0–48.0)
pH, Arterial: 7.338 — ABNORMAL LOW (ref 7.350–7.450)
pH, Arterial: 7.393 (ref 7.350–7.450)
pH, Arterial: 7.409 (ref 7.350–7.450)
pO2, Arterial: 143 mmHg — ABNORMAL HIGH (ref 83.0–108.0)
pO2, Arterial: 101 mmHg (ref 83.0–108.0)
pO2, Arterial: 94 mmHg (ref 83.0–108.0)

## 2021-05-29 LAB — CBC
HCT: 30.4 % — ABNORMAL LOW (ref 39.0–52.0)
HCT: 27.9 % — ABNORMAL LOW (ref 39.0–52.0)
Hemoglobin: 9.3 g/dL — ABNORMAL LOW (ref 13.0–17.0)
Hemoglobin: 8.4 g/dL — ABNORMAL LOW (ref 13.0–17.0)
MCH: 28.9 pg (ref 26.0–34.0)
MCH: 28.3 pg (ref 26.0–34.0)
MCHC: 30.6 g/dL (ref 30.0–36.0)
MCHC: 30.1 g/dL (ref 30.0–36.0)
MCV: 94.4 fL (ref 80.0–100.0)
MCV: 93.9 fL (ref 80.0–100.0)
Platelets: 212 10*3/uL (ref 150–400)
Platelets: 187 10*3/uL (ref 150–400)
RBC: 3.22 MIL/uL — ABNORMAL LOW (ref 4.22–5.81)
RBC: 2.97 MIL/uL — ABNORMAL LOW (ref 4.22–5.81)
RDW: 17 % — ABNORMAL HIGH (ref 11.5–15.5)
RDW: 17 % — ABNORMAL HIGH (ref 11.5–15.5)
WBC: 16.8 10*3/uL — ABNORMAL HIGH (ref 4.0–10.5)
WBC: 12.1 10*3/uL — ABNORMAL HIGH (ref 4.0–10.5)
nRBC: 0.1 % (ref 0.0–0.2)
nRBC: 0 % (ref 0.0–0.2)

## 2021-05-29 LAB — BASIC METABOLIC PANEL
Anion gap: 6 (ref 5–15)
Anion gap: 6 (ref 5–15)
BUN: 62 mg/dL — ABNORMAL HIGH (ref 8–23)
BUN: 67 mg/dL — ABNORMAL HIGH (ref 8–23)
CO2: 33 mmol/L — ABNORMAL HIGH (ref 22–32)
CO2: 33 mmol/L — ABNORMAL HIGH (ref 22–32)
Calcium: 7.9 mg/dL — ABNORMAL LOW (ref 8.9–10.3)
Calcium: 8 mg/dL — ABNORMAL LOW (ref 8.9–10.3)
Chloride: 109 mmol/L (ref 98–111)
Chloride: 111 mmol/L (ref 98–111)
Creatinine, Ser: 1.03 mg/dL (ref 0.61–1.24)
Creatinine, Ser: 0.99 mg/dL (ref 0.61–1.24)
GFR, Estimated: >60 mL/min (ref >60)
GFR, Estimated: >60 mL/min (ref >60)
Glucose, Bld: 186 mg/dL — ABNORMAL HIGH (ref 70–99)
Glucose, Bld: 158 mg/dL — ABNORMAL HIGH (ref 70–99)
Potassium: 4.3 mmol/L (ref 3.5–5.1)
Potassium: 4.1 mmol/L (ref 3.5–5.1)
Sodium: 148 mmol/L — ABNORMAL HIGH (ref 135–145)
Sodium: 150 mmol/L — ABNORMAL HIGH (ref 135–145)

## 2021-05-29 LAB — LACTATE DEHYDROGENASE: LDH: 345 U/L — ABNORMAL HIGH (ref 98–192)

## 2021-05-29 LAB — GLUCOSE, CAPILLARY
Glucose-Capillary: 158 mg/dL — ABNORMAL HIGH (ref 70–99)
Glucose-Capillary: 159 mg/dL — ABNORMAL HIGH (ref 70–99)
Glucose-Capillary: 137 mg/dL — ABNORMAL HIGH (ref 70–99)
Glucose-Capillary: 145 mg/dL — ABNORMAL HIGH (ref 70–99)
Glucose-Capillary: 155 mg/dL — ABNORMAL HIGH (ref 70–99)

## 2021-05-29 LAB — COOXEMETRY PANEL
Carboxyhemoglobin: 1.7 % — ABNORMAL HIGH (ref 0.5–1.5)
Methemoglobin: 1 % (ref 0.0–1.5)
O2 Saturation: 93.1 %
Total hemoglobin: 8.4 g/dL — ABNORMAL LOW (ref 12.0–16.0)

## 2021-05-29 LAB — PROTIME-INR
INR: 2 — ABNORMAL HIGH (ref 0.8–1.2)
Prothrombin Time: 22.5 seconds — ABNORMAL HIGH (ref 11.4–15.2)

## 2021-05-29 LAB — LACTIC ACID, PLASMA: Lactic Acid, Venous: 1.3 mmol/L (ref 0.5–1.9)

## 2021-05-29 LAB — HEPATIC FUNCTION PANEL
ALT: 140 U/L — ABNORMAL HIGH (ref 0–44)
AST: 35 U/L (ref 15–41)
Albumin: 2.4 g/dL — ABNORMAL LOW (ref 3.5–5.0)
Alkaline Phosphatase: 116 U/L (ref 38–126)
Bilirubin, Direct: 0.2 mg/dL (ref 0.0–0.2)
Indirect Bilirubin: 0.5 mg/dL (ref 0.3–0.9)
Total Bilirubin: 0.7 mg/dL (ref 0.3–1.2)
Total Protein: 4.4 g/dL — ABNORMAL LOW (ref 6.5–8.1)

## 2021-05-29 LAB — FIBRINOGEN: Fibrinogen: 373 mg/dL (ref 210–475)

## 2021-05-29 MED ORDER — ALBUMIN HUMAN 25 % IV SOLN
25.0000 g | Freq: Once | INTRAVENOUS | Status: AC
  Administered 2021-05-29: 25 g via INTRAVENOUS
  Filled 2021-05-29: qty 100

## 2021-05-29 MED ORDER — CARVEDILOL 6.25 MG PO TABS
6.2500 mg | ORAL_TABLET | Freq: Two times a day (BID) | ORAL | Status: DC
  Administered 2021-05-29: 6.25 mg
  Filled 2021-05-29 (×2): qty 1

## 2021-05-29 MED ORDER — METHYLPREDNISOLONE SODIUM SUCC 40 MG IJ SOLR
40.0000 mg | INTRAMUSCULAR | Status: AC
  Administered 2021-05-29 – 2021-06-04 (×7): 40 mg via INTRAVENOUS
  Filled 2021-05-29 (×7): qty 1

## 2021-05-29 MED ORDER — BACLOFEN 5 MG HALF TABLET
5.0000 mg | ORAL_TABLET | Freq: Three times a day (TID) | ORAL | Status: DC
  Administered 2021-05-29 – 2021-06-16 (×56): 5 mg
  Filled 2021-05-29 (×57): qty 1

## 2021-05-29 MED ORDER — BACLOFEN 5 MG HALF TABLET
5.0000 mg | ORAL_TABLET | Freq: Three times a day (TID) | ORAL | Status: DC
  Administered 2021-05-29: 5 mg via ORAL
  Filled 2021-05-29: qty 1

## 2021-05-29 MED ORDER — METHYLPREDNISOLONE SODIUM SUCC 40 MG IJ SOLR
20.0000 mg | INTRAMUSCULAR | Status: AC
  Administered 2021-06-05 – 2021-06-11 (×7): 20 mg via INTRAVENOUS
  Filled 2021-05-29 (×7): qty 1

## 2021-05-29 MED ORDER — ATROPINE SULFATE 1 MG/10ML IJ SOSY
PREFILLED_SYRINGE | INTRAMUSCULAR | Status: AC
  Filled 2021-05-29: qty 10

## 2021-05-29 MED ORDER — PANTOPRAZOLE 2 MG/ML SUSPENSION
40.0000 mg | Freq: Two times a day (BID) | ORAL | Status: DC
  Administered 2021-05-29 – 2021-06-11 (×26): 40 mg
  Filled 2021-05-29 (×27): qty 20

## 2021-05-29 MED ORDER — EPINEPHRINE 1 MG/10ML IJ SOSY
PREFILLED_SYRINGE | INTRAMUSCULAR | Status: AC
  Filled 2021-05-29: qty 20

## 2021-05-29 MED ORDER — CARVEDILOL 6.25 MG PO TABS
6.2500 mg | ORAL_TABLET | Freq: Two times a day (BID) | ORAL | Status: DC
  Administered 2021-05-29: 6.25 mg via ORAL
  Filled 2021-05-29: qty 1

## 2021-05-29 MED ORDER — FUROSEMIDE 10 MG/ML IJ SOLN
60.0000 mg | Freq: Once | INTRAMUSCULAR | Status: AC
  Administered 2021-05-29: 60 mg via INTRAVENOUS
  Filled 2021-05-29: qty 6

## 2021-05-29 NOTE — Progress Notes (Signed)
ANTICOAGULATION CONSULT NOTE  Pharmacy Consult for Bivalirudin Indication:  ECMO  Allergies  Allergen Reactions   Amoxicillin-Pot Clavulanate     Per pt report on 08/19/20, makes his urine dark colored   Atorvastatin Other (See Comments)     ( pt states causing runny nose, headaches, issue w/ urination)   Plant Derived Enzymes     Other reaction(s): Unknown   Trichophyton Other (See Comments)    Patient Measurements: Height: 5\' 10"  (177.8 cm) Weight: 80.5 kg (177 lb 7.5 oz) IBW/kg (Calculated) : 73  Vital Signs: Temp: 98.1 F (36.7 C) (02/10 1600) Temp Source: Axillary (02/10 1600) BP: 127/54 (02/10 1800) Pulse Rate: 68 (02/10 1800)  Labs: Recent Labs    05/27/21 0350 05/27/21 0546 05/28/21 0400 05/28/21 0957 05/28/21 1603 05/28/21 1613 05/29/21 0424 05/29/21 1143 05/29/21 1559  HGB 9.9*   < > 10.5*   10.4*   < > 9.5*   < > 9.3*   9.5* 9.5* 8.4*  HCT 32.9*   < > 31.0*   33.6*   < > 32.0*   < > 30.4*   28.0* 28.0* 27.9*  PLT 191   < > 243  --  223  --  212  --  187  APTT 49*   < > 47*  --  47*  --  52*  --  54*  LABPROT 23.7*  --  21.9*  --   --   --  22.5*  --   --   INR 2.1*  --  1.9*  --   --   --  2.0*  --   --   CREATININE 1.02   < > 1.04  --  1.00  --  1.03  --  0.99   < > = values in this interval not displayed.     Estimated Creatinine Clearance: 70.7 mL/min (by C-G formula based on SCr of 0.99 mg/dL).   Medical History: Past Medical History:  Diagnosis Date   Allergic rhinitis    Prostate cancer ([place])     Medications:  Infusions:   sodium chloride Stopped (05/26/21 0138)   albumin human     amiodarone 30 mg/hr (05/29/21 1800)   bivalirudin (ANGIOMAX) infusion 0.5 mg/mL (Non-ACS indications) 0.063 mg/kg/hr (05/29/21 1800)   dexmedetomidine (PRECEDEX) IV infusion Stopped (05/27/21 0533)   feeding supplement (VITAL 1.5 CAL) 1,000 mL (05/29/21 0930)    Assessment: 71 year old male with coronary artery disease who initially presented with chest  pain, noted to have multivessel coronary artery disease, he underwent CABG x3 on 04/22/2021, course was complicated with atrial fibrillation, frequent hiccups and aspiration pneumonia leading to hypoxia and increasing oxygen requirement. Patient now requiring VV ECMO. Pharmacy consulted for bivalirudin IV.   aPTT came back therapeutic at 54, on current rate 0.063 mg/kg/hr. Hgb 8.4, plt 187. LDH stable 300s. Fibrinogen stable 300-400. No issues with infusion or bleeding noted by RN.  Goal of Therapy:  aPTT 50-80 seconds Monitor platelets by anticoagulation protocol: Yes   Plan:  Continue bivalirudin at 0.063 mg/kg/hr Recheck aPTT at 0500 Monitor q12h CBC, closely for s/sx bleeding  Thank you for allowing pharmacy to participate in this patient's care.  Antonietta Jewel, PharmD, [place] Clinical Pharmacist  Phone: [phone] 05/29/2021 6:16 PM  Please check AMION for all [place] phone numbers After 10:00 PM, call [place] [phone]

## 2021-05-29 NOTE — Progress Notes (Addendum)
0700 - Report received from [employer], [place].  All questions answered.  Safety checks performed.  All lines and drips verified. Hand hygiene performed before/after each pt contact. 0730 - Dr. Lynetta Mare rounded with Evette Doffing, med student, and Dr. Aundra Dubin. 0845 - Pt transported to CT without incident. 0930 - Pt returned from CT without incident.  All lines and drips verified. 1000 - Pt's wife arrived to visit. 14 - Dr. Lynetta Mare rounded and updated pt's wife.  All questions answered. [place], Chaplain rounded. [place], Palliative NP, rounded. [place], PT; Colbert Ewing, PT; and Larkin Ina, PT worked with pt.  Pt bathed.  Large contusion noted to pt's medial thigh. 1400 - Pt's wife, Pam, notified bath/PT were completed.  Pam updated.  Pam elected to let pt rest, spoke with him briefly, and went home. 1600 - Dr. Lynetta Mare rounded with Evette Doffing, med student. 1830 - Lab results came back with Hg dropped from 9.5 g/dL to 8.4 g/dL.  Dr. Lynetta Mare notified.  No obvious abdominal pain/distention.  No bleed noted on CT from this morning. 1900 - Report given to Trilby Drummer, Therapist, sports.  All questions answered.  Classical music playing for pt.  Dr. Lynetta Mare advised it helps decrease delirium.

## 2021-05-29 NOTE — Progress Notes (Signed)
Physical Therapy Treatment Patient Details Name: Randall Hodges MRN: [id number] DOB: [birth year] Today's Date: 05/29/2021   History of Present Illness 71 yo male with CAD who initially presented with CP, noted to have multivessel CAD, he underwent CABG x3 on 04/24/2021, course was complicated with atrial fibrillation, frequent hiccups and aspiration PNA leading to hypoxia and increasing oxygen requirement. ETT 1/18-1/18. 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 Cortrak 1/30. 2/2 placed on VV ECMO. 2/7 trach. PMH: prostate CA    PT Comments    Pt with decreased ability to follow commands this date, maintaining a blank anterior gaze throughout with no attempts to track or look at anyone. Pt also not actively blinking. Pt only following intermittent commands to squeeze hands, give thumbs up, or protrude tongue slightly. Pt not following any commands or displaying purposeful muscle activation or withdrawal to noxious stimuli in bil lower extremities. Spontaneous bil lower extremity movement noted intermittently. Attempted to facilitate pt participation and muscle activation through tilting Kreg bed to 30-49 degrees for 9 min and through trying to get pt to sit up unsupported off elevated HOB with bed in chair position, but no success. Will continue to follow acutely. Hopeful that pt will begin to actively participate soon and eventually d/c to AIR pending progression.    Recommendations for follow up therapy are one component of a multi-disciplinary discharge planning process, led by the attending physician.  Recommendations may be updated based on patient status, additional functional criteria and insurance authorization.  Follow Up Recommendations  Acute inpatient rehab (3hours/day)     Assistance Recommended at Discharge Frequent or constant Supervision/Assistance  Patient can return home with the following A lot of help with walking and/or transfers;A lot of help with  bathing/dressing/bathroom;Two people to help with walking and/or transfers;Two people to help with bathing/dressing/bathroom;Assistance with feeding;Assistance with cooking/housework;Direct supervision/assist for medications management;Direct supervision/assist for financial management;Assist for transportation;Help with stairs or ramp for entrance   Equipment Recommendations  BSC/3in1;Wheelchair (measurements PT);Wheelchair cushion (measurements PT);Hospital bed;Other (comment) (hoyer lift)    Recommendations for Other Services       Precautions / Restrictions Precautions Precautions: Fall;Sternal Precaution Booklet Issued: No Precaution Comments: ECMO, cortrak, foley, aline, sternal precautions, flexiseal, trach on vent Restrictions Other Position/Activity Restrictions: sternal precautions     Mobility  Bed Mobility Overal bed mobility: Needs Assistance Bed Mobility: Supine to Sit     Supine to sit: Total assist, HOB elevated     General bed mobility comments: After failed attempts to get pt to activate with kreg tilt bed, attempted bed in chair position with cues for pt to sit up unsupported from elevated HOB. Pt needing TA to pull trunk to upright position and dependent placement of hands on knees to try to facilitate anterior weight shift, but pt pulling arms into extension with no reactional strategies when support from back removed. Angle: 49 degrees (30-49 range, ~70% of time at 49') Total Minutes in Angle: 9 minutes Patient Response: Flat affect  Transfers                   General transfer comment: Stood in [place] tilt bed, see below.    Ambulation/Gait               General Gait Details: deferred   Stairs             Wheelchair Mobility    Modified Rankin (Stroke Patients Only)       Balance Overall balance assessment: Needs assistance  Sitting-balance support: No upper extremity supported, Feet unsupported Sitting balance-Leahy Scale:  Zero Sitting balance - Comments: TA to sit up unsupported from elevated HOB with bed in chair position.   Standing balance support: Bilateral upper extremity supported, No upper extremity supported Standing balance-Leahy Scale: Zero Standing balance comment: No activation from pt to extend legs ot stand in kreg tilt bed                            Cognition Arousal/Alertness: Awake/alert Behavior During Therapy: Flat affect Overall Cognitive Status: Difficult to assess                                 General Comments: Pt following ~15% of commands to stick out tongue, thumbs up, and squeeze hands only with delay. Did not follow commands for lower extremities at all and pt did not pull away legs with noxious stimuli, only noted a slight grimace with noxious stimuli at legs occasionally. Eyes open with blank anterior stare throughout, not tracking or making eye contact throughout.        Exercises Other Exercises Other Exercises: bilat knee extension in tilt with repeated tactile and verbal cues, but no purposeful activation noted by pt, sponatenous leg movements only intermittently during session    General Comments General comments (skin integrity, edema, etc.): VSS with HR in 70-80s throughout with no noticable increase with activity; Vent on pressure support 40% FiO2, PEEP 10; no noted withdrawal to noxious stimuli at either leg, slight intermittent grimace noted; no activation of bil lower extremities when cued, but spontaneous movements bil intermittently      Pertinent Vitals/Pain Pain Assessment Pain Assessment: Faces Faces Pain Scale: Hurts a little bit Pain Location: grimacing with attempts at coughing/need to have suction Pain Descriptors / Indicators: Grimacing Pain Intervention(s): Limited activity within patient's tolerance, Monitored during session, Repositioned    Home Living                          Prior Function             PT Goals (current goals can now be found in the care plan section) Acute Rehab PT Goals Patient Stated Goal: unable to state PT Goal Formulation: Patient unable to participate in goal setting Time For Goal Achievement: 06/05/21 Potential to Achieve Goals: Fair Progress towards PT goals: Not progressing toward goals - comment (limited by imapired cognitive status)    Frequency    Min 3X/week      PT Plan Current plan remains appropriate    Co-evaluation              AM-PAC PT "6 Clicks" Mobility   Outcome Measure  Help needed turning from your back to your side while in a flat bed without using bedrails?: Total Help needed moving from lying on your back to sitting on the side of a flat bed without using bedrails?: Total Help needed moving to and from a bed to a chair (including a wheelchair)?: Total Help needed standing up from a chair using your arms (e.g., wheelchair or bedside chair)?: Total Help needed to walk in hospital room?: Total Help needed climbing 3-5 steps with a railing? : Total 6 Click Score: 6    End of Session Equipment Utilized During Treatment: Oxygen Activity Tolerance: Other (comment) (limited by impaired cognition and ability  to follow commands) Patient left: in bed;with call bell/phone within reach;with nursing/sitter in room (nursing bathing pt) Nurse Communication: Mobility status PT Visit Diagnosis: Muscle weakness (generalized) (M62.81);Other abnormalities of gait and mobility (R26.89);Difficulty in walking, not elsewhere classified (R26.2);Unsteadiness on feet (R26.81)     Time: 9198-0221 PT Time Calculation (min) (ACUTE ONLY): 41 min  Charges:  $Therapeutic Activity: 8-22 mins $Neuromuscular Re-education: 23-37 mins                     Moishe Spice, PT, DPT Acute Rehabilitation Services  Pager: [phone] Office: [phone]    Orvan Falconer 05/29/2021, 2:06 PM

## 2021-05-29 NOTE — Progress Notes (Signed)
Patient ID: KHRISTOPHER KAPAUN, male   DOB: 09/03/1950, 71 y.o.   MRN: [id number] Extracorporeal support note   ECLS support day: 8 Indication: ARDS  Configuration: VV  Drainage cannula: Crescent R IJ Return cannula: Crescent R IJ  Pump speed: 2700 rpm Pump flow: 3.3 L/min Pump used: Cardiohelp  Sweep gas: 3   Circuit check: Small amount thrombus. Good color change. LDH stable.  Anticoagulant: Bivalirudin, PTT goal 50-80  Changes in support: Wean sweep as able. Vent adjusted per CCM.   Anticipated goals/duration of support: Wean to discontinuation  Loralie Champagne, MD  7:40 AM

## 2021-05-29 NOTE — Progress Notes (Signed)
This chaplain is present for F/U spiritual care. The RN-Elizabeth is giving the Pt. wife-Pam and cousin-Barbara an update at the bedside.    The chaplain understands from Kimber Relic offers a sense of companionship and humor in her presence. Pam is comfortable stepping away from the Pt. when Pamala Hurry is present. The chaplain listens reflectively as Jeannene Patella talks about the Pt. future through a lens of healing and God's love.   Pam and Pamala Hurry accepted the chaplain's invitation for prayer and continued spiritual care. The chaplain educated Pam on the presence of a chaplain 24 hours a day.  Chaplain Sallyanne Kuster [phone]

## 2021-05-29 NOTE — Progress Notes (Addendum)
° °Palliative Medicine Inpatient Follow Up Note ° ° °HPI:   °71 y.o. male   admitted on 05/08/2021 with a history of CAD who presented to the ED with chest pain who underwent heart catherization, found to have severe multivessel CAD with normal LV function. He underwent CABG x 3 on 1/18. He had recovered uneventfully but while continuing treatment on the floor he vomited and aspirated. Due to severe respiratory failure he was transferred to the ICU on 1/30. He was maintained on BiPAP, but due to progressive decline he was intubated on 2/2. He had high peak and plateau pressures with severe hypercapnia. Despite proning and heavy sedation his pH remained 7.06 with pCO2 remaining >110. ECMO consult called for hypercapnia and high vent pressures. °  °Patient currently intubated, and on VV ECMO ° °Palliative involved for ongoing family support. ° °Today's Discussion (05/29/2021): ° °*Please note that this is a verbal dictation therefore any spelling or grammatical errors are due to the "[employer] One" system interpretation. ° °Chart reviewed inclusive of vital signs, progress notes, laboratory results, and diagnostic images.  ° °I met with patient's cousin, Randall Hodges and wife, Randall Hodges at bedside.  Reviewed the tenuous course that patients have with ECMO and how difficult that can be to see and experience for loved ones.   ° °Discussed that ECMO is a very long journey and it can be unclear the direction in which things may travel at times. ° °Spoke to patient's cousin, Randall Hodges about her feelings whereby she seems to understand the gravity of Randall Hodges's situation.   ° °Randall Hodges, Randall Hodges's spouse reviewed that she is starting to understand the process and the long road he is on.  She remains optimistic at this point in time. ° °Created space and opportunity for patient to explore thoughts feelings and fears regarding current medical situation. ° °Questions and concerns addressed  ° °Palliative Support  Provided °____________________________________________ °Addendum: ° °I spoke to patients son, Randall Hodges this evening. Updated him on patient course. Reviewed the long road we are on and answered questions. Shared that additional information could be obtained from CCM Mds. I recommended when he comes in tomorrow to request this. ° °Additional Time: 30 minutes. ° °Objective Assessment: °Vital Signs °Vitals:  ° 05/29/21 1200 05/29/21 1300  °BP: (!) 110/57 (!) 113/59  °Pulse: 71 73  °Resp: 17 20  °Temp: 97.7 °F (36.5 °C)   °SpO2: 100% 100%  ° ° °Intake/Output Summary (Last 24 hours) at 05/29/2021 1423 °Last data filed at 05/29/2021 1200 °Gross per 24 hour  °Intake 3639.9 ml  °Output 3160 ml  °Net 479.9 ml  ° °Last Weight  Most recent update: 05/29/2021  6:13 AM  ° ° Weight  °80.5 kg (177 lb 7.5 oz)  °      ° °  ° °Gen:  Older M  °HEENT: dry mucous membranes, (+) trach °CV: Regular rate and rhythm  °PULM: Mechanical ventilator °ABD: soft/nontender  °EXT: (+) pitting edema  °Neuro:  Sedated  ° °SUMMARY OF RECOMMENDATIONS   °Full Code / Full Scope of Care ° °ECMO as a bridge to recovery ° °Appreciate chaplain involvement ° °Ongoing incremental Palliative support ° °MDM - High ° °Medical Decision Making: 4 °#/Complex Problems: 4                     °Data Reviewed:    4             °Management: 4 °(1-Straightforward, 2-Low, 3-Moderate, 4-High) °______________________________________________________________________________________ °   [place] Team Team Cell Phone: [phone] Please utilize secure chat with additional questions, if there is no response within 30 minutes please call the above phone number  Palliative Medicine Team providers are available by phone from 7am to 7pm daily and can be reached through the team cell phone.  Should this patient require assistance outside of these hours, please call the patient's attending physician.

## 2021-05-29 NOTE — Progress Notes (Incomplete Revision)
Palliative Medicine Inpatient Follow Up Note   [name]   admitted on 05/01/2021 with a history of CAD who presented to the ED with chest pain who underwent heart catherization, found to have severe multivessel CAD with normal LV function. He underwent CABG x 3 on 1/18. He had recovered uneventfully but while continuing treatment on the floor he vomited and aspirated. Due to severe respiratory failure he was transferred to the ICU on 1/30. He was maintained on BiPAP, but due to progressive decline he was intubated on 2/2. He had high peak and plateau pressures with severe hypercapnia. Despite proning and heavy sedation his pH remained 7.06 with pCO2 remaining >110. ECMO consult called for hypercapnia and high vent pressures.   Patient currently intubated, and on VV ECMO  Palliative involved for ongoing family support.  Today's Discussion (05/29/2021):  *Please note that this is a verbal dictation therefore any spelling or grammatical errors are due to the "[employer]" system interpretation.  Chart reviewed inclusive of vital signs, progress notes, laboratory results, and diagnostic images.   I met with patient's cousin, Randall Hodges and wife, Randall Hodges at bedside.  Reviewed the tenuous course that patients have with ECMO and how difficult that can be to see and experience for loved ones.    Discussed that ECMO is a very long journey and it can be unclear the direction in which things may travel at times.  Spoke to patient's cousin, Randall Hodges about her feelings whereby she seems to understand the gravity of Randall Hodges's situation.    Randall Hodges, Lukka's spouse reviewed that she is starting to understand the process and the long road he is on.  She remains optimistic at this point in time.  Created space and opportunity for patient to explore thoughts feelings and fears regarding current medical situation.  Questions and concerns addressed   Palliative Support  Provided ____________________________________________ Addendum:  I spoke to patients son,   Objective Assessment: Vital Signs Vitals:   05/29/21 1200 05/29/21 1300  BP: (!) 110/57 (!) 113/59  Pulse: 71 73  Resp: 17 20  Temp: 97.7 F (36.5 C)   SpO2: 100% 100%    Intake/Output Summary (Last 24 hours) at 05/29/2021 1423 Last data filed at 05/29/2021 1200 Gross per 24 hour  Intake 3639.9 ml  Output 3160 ml  Net 479.9 ml   Last Weight  Most recent update: 05/29/2021  6:13 AM    Weight  80.5 kg (177 lb 7.5 oz)            Gen:  Older M  HEENT: dry mucous membranes, (+) trach CV: Regular rate and rhythm  PULM: Mechanical ventilator ABD: soft/nontender  EXT: (+) pitting edema  Neuro:  Sedated   SUMMARY OF RECOMMENDATIONS   Full Code / Full Scope of Care  ECMO as a bridge to recovery  Appreciate chaplain involvement  Ongoing incremental Palliative support  MDM - High  Medical Decision Making: 4 #/Complex Problems: 4                     Data Reviewed:    4             Management: 4 (1-Straightforward, 2-Low, 3-Moderate, 4-High) ______________________________________________________________________________________ [place] Palliative Medicine Team Team Cell Phone: [phone] Please utilize secure chat with additional questions, if there is no response within 30 minutes please call the above phone number  Palliative Medicine Team providers are available by phone from 7am  to 7pm daily and can be reached through the team cell phone.  Should this patient require assistance outside of these hours, please call the patient's attending physician.

## 2021-05-29 NOTE — Progress Notes (Signed)
ANTICOAGULATION CONSULT NOTE  Pharmacy Consult for Bivalirudin Indication:  ECMO  Allergies  Allergen Reactions   Amoxicillin-Pot Clavulanate     Per pt report on 08/19/20, makes his urine dark colored   Atorvastatin Other (See Comments)     ( pt states causing runny nose, headaches, issue w/ urination)   Plant Derived Enzymes     Other reaction(s): Unknown   Trichophyton Other (See Comments)    Patient Measurements: Height: 5\' 10"  (177.8 cm) Weight: 80.5 kg (177 lb 7.5 oz) IBW/kg (Calculated) : 73  Vital Signs: Temp: 98.5 F (36.9 C) (02/10 0400) Temp Source: Oral (02/10 0400) BP: 159/53 (02/10 0400) Pulse Rate: 84 (02/10 0600)  Labs: Recent Labs    05/27/21 0350 05/27/21 0546 05/28/21 0400 05/28/21 0957 05/28/21 1603 05/28/21 1613 05/28/21 2005 05/29/21 0424  HGB 9.9*   < > 10.5*   10.4*   < > 9.5* 10.2* 9.9* 9.3*   9.5*  HCT 32.9*   < > 31.0*   33.6*   < > 32.0* 30.0* 29.0* 30.4*   28.0*  PLT 191   < > 243  --  223  --   --  212  APTT 49*   < > 47*  --  47*  --   --  52*  LABPROT 23.7*  --  21.9*  --   --   --   --  22.5*  INR 2.1*  --  1.9*  --   --   --   --  2.0*  CREATININE 1.02   < > 1.04  --  1.00  --   --  1.03   < > = values in this interval not displayed.     Estimated Creatinine Clearance: 67.9 mL/min (by C-G formula based on SCr of 1.03 mg/dL).   Medical History: Past Medical History:  Diagnosis Date   Allergic rhinitis    Prostate cancer ([place])     Medications:  Infusions:   sodium chloride Stopped (05/26/21 0138)   albumin human     amiodarone 30 mg/hr (05/29/21 1000)   bivalirudin (ANGIOMAX) infusion 0.5 mg/mL (Non-ACS indications) 0.063 mg/kg/hr (05/29/21 1000)   dexmedetomidine (PRECEDEX) IV infusion Stopped (05/27/21 0533)   feeding supplement (VITAL 1.5 CAL) 1,000 mL (05/29/21 0930)   fentaNYL infusion INTRAVENOUS 50 mcg/hr (05/29/21 1000)    Assessment: 71 year old male with coronary artery disease who initially presented with  chest pain, noted to have multivessel coronary artery disease, he underwent CABG x3 on 04/19/2021, course was complicated with atrial fibrillation, frequent hiccups and aspiration pneumonia leading to hypoxia and increasing oxygen requirement. Patient now requiring VV ECMO. Pharmacy consulted for bivalirudin IV.   aPTT 52, on current rate 0.063 mg/kg/hr.  Hgb stable 9.5, plt WNL, LDH stable 300s. Fibrinogen stable 300-400.  No issues with infusion or bleeding noted by RN.  Goal of Therapy:  aPTT 50-80 seconds Monitor platelets by anticoagulation protocol: Yes   Plan:  Continue bivalirudin at 0.063 mg/kg/hr Recheck aPTT at 1700 Monitor q12h CBC, closely for s/sx bleeding  Thank you for allowing pharmacy to participate in this patient's care.  Levonne Spiller, PharmD PGY1 Acute Care Resident  05/29/2021,10:35 AM

## 2021-05-29 NOTE — Progress Notes (Signed)
NAME:  Randall Hodges, MRN:  [id number], DOB:  Nov 08, 1950, LOS: 24 ADMISSION DATE:  05/02/2021, CONSULTATION DATE: 05/16/2021 REFERRING MD:  Dahlia Byes, MD, CHIEF COMPLAINT: Increasing shortness of breath  History of Present Illness:  71 year old male with coronary artery disease who initially presented with chest pain, noted to have multivessel coronary artery disease, he underwent CABG x3 on 04/1818 23, course was complicated with atrial fibrillation, frequent hiccups and aspiration pneumonia leading to hypoxia and increasing oxygen requirement. PCCM was consulted for evaluation and help with management  Patient stated hiccups are better but continued complain of shortness of breath, cough unable to bring up phlegm.  Pertinent  Medical History   Past Medical History:  Diagnosis Date   Allergic rhinitis    Prostate cancer ([place])     Significant Hospital Events: Including procedures, antibiotic start and stop dates in addition to other pertinent events   1/18 CABG 1/30 PCCM consult, Worsening O2 needs, likely aspiration, new pressor need 2/2 intubation, placed on VV ECMO 2/5 issues with recirc, bronch neg, improved with supine positioning  Interim History / Subjective:  Intermediate cough without significant events over night. Tolerating sweep 3  Objective   Blood pressure (!) 159/53, pulse 84, temperature 98.5 F (36.9 C), temperature source Oral, resp. rate (!) 22, height _0  (1.778 m), weight 80.5 kg, SpO2 99 %. CVP:  [7 mmHg-10 mmHg] 7 mmHg  Vent Mode: PCV FiO2 (%):  [50 %] 50 % Set Rate:  [22 bmp] 22 bmp PEEP:  [10 cmH20] 10 cmH20 Plateau Pressure:  [28 cmH20] 28 cmH20   Intake/Output Summary (Last 24 hours) at 05/29/2021 5809 Last data filed at 05/29/2021 0800 Gross per 24 hour  Intake 4181.89 ml  Output 3950 ml  Net 231.89 ml   Filed Weights   05/27/21 0500 05/28/21 0500 05/29/21 0600  Weight: 78.5 kg 80.4 kg 80.5 kg    Examination: No distress on  vent Lungs with crackles, mild bases Heart RRR without rubs or gallops Bilateral pitting edema Sternotomy scar well healed Will still wake up and follow commands with sedation wean Stable clot in oxygenator 12 oclock  Current drips: fent, amio, precedex, bival CVP 6  Lab review Renal function stable Sodium up Bicarb up LDH 1400>>615>>380>>428>>391>>354>>313>>327>>345 CBG ok CBC stable, H/H responded to transfusion Lactate within normal values 1.3  PTT 52 (50-80 goal) Fibrinogen continues to trend down 396>>390>>373  CXR reduced opacity compared to previous CXR. Ca Corrected for albumin 9.2  Resolved Hospital Problem list   Postop recurrent shock question constrictive pericaritis type physiology vs. Septic  Assessment & Plan:  CAD with unstable angina s/p CABGx3 1/18 (LIMA-LAD, SVG-OM, SVG-left PDA) Presumed aspiration pneumonitis vs. HCAP vs. ?AEILD culminating in severe ARDS with inability to ventilate along with high Aa gradient s/p VV ECMO cannulation 05/21/22.  CXR deteriorated between 1/26 and 1/29.  Slowly improving.  Repeat RVP/COVID neg.  BAL neg.  Sed rate improving. Need for [facility] for ECMO Atypical aflutter on amio Baseline fibrotic lung disease with preserved PFTs 2019, question of sarcoid- lost to f/u, see Dr. Matilde Bash note 08/15/17 AKI- multifactorial (sepsis, poor cardiac outflow, shock) improved Bilateral effusions- post CABG, R>L on bedside US (2/3), do not think would help vent wean at this time to drain  - Continue sweep wean as able - Non-contrast CT today - Start BID baclofen for hiccups to prevent aspiration - continue abx, steroids as ordered - Albumin and diurese w/ lasix as allowed by ECMO circuit - hyroxyzine  PRN for pain management - PT/OT w/ reduced sedation to promote activity    Best Practice (right click and "Reselect all SmartList Selections" daily)   Diet/type: Vital @ 73m/hr DVT prophylaxis: Bival CBG: yes, SSI GI prophylaxis: PPI Lines:  L [place] TLC, RIJ  Foley:  Yes, keep Code Status:  full code Last date of multidisciplinary goals of care discussion [daily at bedside]  34 min cc time DErskine EmeryMD PCCM

## 2021-05-29 NOTE — Progress Notes (Signed)
Patient ID: HADY [id number], male   DOB: 1950-09-19, 71 y.o.   MRN: [id number]   Progress Note  Patient Name: Randall Hodges Date of Encounter: 05/29/2021  [facility] Cardiologist: [name], MD   Subjective   2/2: VV ECMO initiation, Crescent catheter right IJ 2/7: Tracheostomy  Remains on vent. On precedex for sedation. Will wake up and follow commands. CVP 8 with mildly positive I/Os. Received acetazolamide 250 mg bid yesterday and Lasix 60 mg IV x 1 with albumin.  BP elevated, now on amlodipine and hydralazine.   He is in NSR on amiodarone gtt.  Bivalirudin gtt for ECMO circuit. No bleeding  LDH 615 -> 380 -> 428 -> 391 -> 354 -> 313 -> 327 -> 345  Has CorTrak tube feeds.   Vent FiO2 0.5. CXR still with bilateral diffuse airspace disease, looks unchanged to me. Sweep weaning slowly.    ECMO: Speed 2700 rpm Flow 3.3 L/min pVen -44 DeltaP 14 Sweep 3 PTT 52 (goal 50-80) LDH 345 ABG 7.34/64.5/143/99%  Cardiac Studies: Echo (limited, 1/30): Echo reviewed, there is clear respirophasic variation of the interventricular septum and marked respirophasic variation of E inflow velocity on doppler evaluation of the mitral valve.  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  LV EF 45-50%.   RHC Procedural Findings (on norepinephrine 6): Hemodynamics (mmHg) RA mean 12 RV 37/12 PA 38/16, mean 27 PCWP mean 11 LV 108/12 AO 96/55 PAPI 1.8 Oxygen saturations: PA 54% AO 94% Cardiac Output (Fick) 5.59  Cardiac Index (Fick) 2.81  PVR 2.8 WU Simultaneous RV/LV tracings were obtained.  Difficult to interpret due to atrial fibrillation.  There was some suggestion of discordance (ventricular interdependence) but not clear.  Inpatient Medications    Scheduled Meds:  sodium chloride    Intravenous Once   acetaZOLAMIDE  250 mg Per Tube BID   amLODipine  10 mg Per NG tube Daily   aspirin  81 mg Per Tube Daily   baclofen  5 mg Oral TID   carvedilol  6.25 mg Oral BID WC   chlorhexidine gluconate (MEDLINE KIT)  15 mL Mouth Rinse BID   Chlorhexidine Gluconate Cloth  6 each Topical Daily   colchicine  0.6 mg Per Tube Daily   docusate  100 mg Per Tube BID   feeding supplement (PROSource TF)  45 mL Per Tube TID   fentaNYL (SUBLIMAZE) injection  200 mcg Intravenous Once   free water  300 mL Per Tube Q4H   furosemide  60 mg Intravenous Once   guaiFENesin  30 mL Per Tube QID   hydrALAZINE  25 mg Per Tube TID   insulin aspart  0-15 Units Subcutaneous Q4H   mouth rinse  15 mL Mouth Rinse 10 times per day   methylPREDNISolone (SOLU-MEDROL) injection  40 mg Intravenous Q12H   pantoprazole sodium  40 mg Per Tube Daily   QUEtiapine  25 mg Per Tube QHS   rosuvastatin  10 mg Per Tube Daily   sennosides  5 mL Per Tube BID   sodium chloride flush  10-40 mL Intracatheter Q12H   sodium chloride flush  10-40 mL Intracatheter Q12H   sodium chloride flush  3 mL Intravenous Q12H   Continuous Infusions:  sodium chloride Stopped (05/26/21 0138)   albumin human     albumin human     amiodarone 60 mg/hr (05/29/21  0700)   bivalirudin (ANGIOMAX) infusion 0.5 mg/mL (Non-ACS indications) 0.063 mg/kg/hr (05/29/21 0700)   dexmedetomidine (PRECEDEX) IV infusion Stopped (05/27/21 0533)   feeding supplement (VITAL 1.5 CAL) 1,000 mL (05/28/21 2330)   fentaNYL infusion INTRAVENOUS 100 mcg/hr (05/29/21 0700)   PRN Meds: Place/Maintain arterial line **AND** sodium chloride, acetaminophen (TYLENOL) oral liquid 160 mg/5 mL, albumin human, diazepam, fentaNYL (SUBLIMAZE) injection, hydrOXYzine, levalbuterol, ondansetron (ZOFRAN) IV, oxyCODONE, polyvinyl alcohol, sodium chloride flush   Vital Signs    Vitals:   05/29/21 0000 05/29/21 0400 05/29/21 0500 05/29/21 0600  BP: (!) 153/53 (!) 159/53    Pulse:  82 80 82 84  Resp: (!) _0 (!) 22  Temp: 99.1 F (37.3 C) 98.5 F (36.9 C)    TempSrc: Oral Oral    SpO2: 98% 98% 98% 97%  Weight:    80.5 kg  Height:        Intake/Output Summary (Last 24 hours) at 05/29/2021 0741 Last data filed at 05/29/2021 0700 Gross per 24 hour  Intake 5109.54 ml  Output 3825 ml  Net 1284.54 ml   Last 3 Weights 05/29/2021 05/28/2021 05/27/2021  Weight (lbs) 177 lb 7.5 oz 177 lb 4 oz 173 lb 1 oz  Weight (kg) 80.5 kg 80.4 kg 78.5 kg      Telemetry    NSR 60s-70s (personally reviewed)  Physical Exam   General: NAD, RIJ Crescent Neck: No JVD, no thyromegaly or thyroid nodule.  Lungs: Crackles at bases.  CV: Nondisplaced PMI.  Heart regular S1/S2, no S3/S4, no murmur.  1+ ankle edema.   Abdomen: Soft, nontender, no hepatosplenomegaly, no distention.  Skin: Intact without lesions or rashes.  Neurologic: Alert and oriented x 3.  Psych: Normal affect. Extremities: No clubbing or cyanosis.  HEENT: Normal.    Labs    High Sensitivity Troponin:   Recent Labs  Lab 04/26/2021 1321 05/10/2021 1518  TROPONINIHS 3 4     Chemistry Recent Labs  Lab 05/25/21 0344 05/25/21 0528 05/26/21 0328 05/26/21 1610 05/27/21 0350 05/27/21 0546 05/28/21 0400 05/28/21 0957 05/28/21 1603 05/28/21 1613 05/28/21 2005 05/29/21 0424  NA 147*   < > 150*   150*   < > 150*   < > 148*   148*   < > 149* 149* 149* 148*   148*  K 4.1   < > 4.3   4.2   < > 4.7   < > 4.6   4.6   < > 4.2 4.2 4.2 4.3   4.5  CL 105   < > 109   < > 114*   < > 109  --  110  --   --  109  CO2 35*   < > 35*   < > 31   < > 32  --  34*  --   --  33*  GLUCOSE 173*   < > 181*   < > 185*   < > 186*  --  188*  --   --  186*  BUN 58*   < > 56*   < > 55*   < > 56*  --  59*  --   --  62*  CREATININE 1.17   < > 1.10   < > 1.02   < > 1.04  --  1.00  --   --  1.03  CALCIUM 7.8*   < > 7.8*   < > 7.9*   < > 8.0*  --  8.0*  --   --  7.9*  MG 2.1  --  2.2  --  2.3  --   --   --   --   --   --   --   PROT 4.4*   --  4.0*  --  4.2*  --  4.5*  --   --   --   --  4.4*  ALBUMIN 2.0*  --  1.9*  --  2.0*  --  2.1*  --   --   --   --  2.4*  AST 165*  --  196*  --  86*  --  38  --   --   --   --  35  ALT 274*  --  387*  --  306*  --  220*  --   --   --   --  140*  ALKPHOS 155*  --  189*  --  185*  --  151*  --   --   --   --  116  BILITOT 0.7  --  1.9*  --  1.3*  --  0.9  --   --   --   --  0.7  GFRNONAA >60   < > >60   < > >60   < > >60  --  >60  --   --  >60  ANIONGAP 7   < > 6   < > 5   < > 7  --  5  --   --  6   < > = values in this interval not displayed.    Lipids  No results for input(s): CHOL, TRIG, HDL, LABVLDL, LDLCALC, CHOLHDL in the last 168 hours.   Hematology Recent Labs  Lab 05/28/21 0400 05/28/21 0957 05/28/21 1603 05/28/21 1613 05/28/21 2005 05/29/21 0424  WBC 17.4*  --  16.3*  --   --  16.8*  RBC 3.58*  --  3.37*  --   --  3.22*  HGB 10.5*   10.4*   < > 9.5* 10.2* 9.9* 9.3*   9.5*  HCT 31.0*   33.6*   < > 32.0* 30.0* 29.0* 30.4*   28.0*  MCV 93.9  --  95.0  --   --  94.4  MCH 29.1  --  28.2  --   --  28.9  MCHC 31.0  --  29.7*  --   --  30.6  RDW 16.7*  --  16.9*  --   --  17.0*  PLT 243  --  223  --   --  212   < > = values in this interval not displayed.   Thyroid No results for input(s): TSH, FREET4 in the last 168 hours.  BNPNo results for input(s): BNP, PROBNP in the last 168 hours.  DDimer No results for input(s): DDIMER in the last 168 hours.   Radiology    DG CHEST PORT 1 VIEW  Result Date: 05/28/2021 CLINICAL DATA:  Lurline Idol present.  On ECMO. EXAM: PORTABLE CHEST 1 VIEW COMPARISON:  AP chest 08/24/2021 FINDINGS: Status post median sternotomy and CABG. Tracheostomy tube overlies the midline trachea. Enteric tube descends below the diaphragm with the tip excluded by collimation inferiorly. ECMO device is noted. Interval removal of left subclavian central venous catheter. Moderate bilateral interstitial thickening with mild bibasilar heterogeneous airspace opacities.  Unchanged minimally improved aeration of the bilateral lungs. Again small bilateral pleural effusions may be present. Mildly displaced acute lateral right second rib fracture is seen. IMPRESSION::  IMPRESSION: 1. Interval removal of left subclavian central venous catheter. Otherwise support staple apparatus. 2. Moderate bilateral interstitial thickening and mild bibasilar heterogeneous airspace opacities with small pleural effusions. Electronically Signed   By: Yvonne Kendall M.D.   On: 05/28/2021 08:23    Cardiac Studies   Cath 05/02/2021 Distal left main [place] 111 bifurcation stenosis with 75% left main, 90% ostial to proximal LAD, and 80-90% ostial circumflex (difficult to assess due to heavy calcification). Severe mid circumflex disease with 70% eccentric mid stenosis and second obtuse marginal containing ostial to proximal greater than 80% stenosis.  (Bifurcation [place] 111 Severe calcification in left main and LAD in particular with diffuse 50% narrowing from proximal to mid vessel and tandem 70% stenoses in the mid LAD. Nondominant right coronary Normal LV function.  EF 55%.  LVEDP normal.    Patient Profile     71 y.o. male with PMH of PVCs presented with chest pain. Cardiac cath by Dr. Tamala Julian on 05/04/2021 showed 75% left main, 90% ost to prox LAD, 80-90% ost LCx, 70% mid LCx, 80% OM2, 50% prox to mid LAD, 70% mid LAD, EF 55%. Patient underwent CABG x 3 on 05/04/2021. Post op course complicated afib, treated with amio. CXR showed opacity in bilateral lung, started abx on 1/25 and diuretic. Started on Eliquis due to recurrence of afib.    Assessment & Plan    1. CAD: Admitted with unstable angina, cath with severe left main and proximal LAD/LCx disease (nondominant RCA).  CABG x 3 on 1/18 with LIMA-LAD, SVG-OM, SVG-left PDA.  No s/s angina  - Continue ASA 81, statin.  2. Acute HF with mid range EF:  Echo on 1/30 with EF 45-50%, clear respirophasic variation of the interventricular septum and  marked respirophasic variation of E inflow velocity on doppler evaluation of the mitral valve; small to moderate pericardial effusion with pericardial thickening, concerning for effusive/constrictive pericarditis (not consistent with tamponade with more organized pericardium but probably similar hemodynamics). RHC 1/30 with equalization of diastolic pressures.  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  Repeated echo 2/2 still showed respirophasic septal variation but not as impressive. CVP 8 today with I/Os mildly positive, weight stable.  - Acetazolamide 250 mg bid again today and will give Lasix 60 mg IV x 1 along with albumin.  - With concern for development of post-surgical effusive/constrictive pericarditis, he is on colchicine.  - ?need for surgical intervention on effusive/constrictive pericarditis. Will need improvement of lung disease first then reassess, repeat echo not as impressive.   3. 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  Suspect primarily septic/vasodilatory shock. He is currently off vasopressin and NE with MAP now high. 4. Hiccups: Still present.  - Add baclofen.  5. PNA: Of note, he does appear to have had some pre-existing ILD from 2019 CT chest (?sarcoidosis). CXR with persistent bilateral infiltrates, possible mild improvement compared to yesterday.  Have thought most likely aspiration PNA/pneumonitis in setting of intractable hiccups. He has developred ARDS.  Afebrile. COVID was negative.  FiO2 0.5 on vent.  We have struggled to wean sweep but down to 3 today. CXR similar with bilateral diffuse airspace disease. Now with tracheostomy.  - Continue slow sweep wean to get to the point where we can decannulate.  ABG this morning does not look like we can wean sweep further right now.  - CT chest today.  - Discussed with pulmonary, think amiodarone toxicity unlikely as he has only been on it  post-op.  -  Completed vancomycin and meropenem.   - Vent per CCM.  - Solumedrol 40 mg IV bid, ESR now normal.  6. Atypical atrial flutter/PVCs:  Remains in NSR - Decrease amiodarone to 30 mg/hr. - Bivalirudin gtt, goal PTT 50-80.  Discussed dosing with PharmD personally. 7. AKI: Creatinine improved to 1.03.  Follow closely.  8. Elevated LFTs: Suspect shock liver, follow CMET.  LFTs trending down.  9. Anemia: Stable hgb at 9.3. Marland Kitchen 10. Hypernatremia: Continue free water 300 cc q4 hrs.  11. HTN: Now on amlodipine and hydralazine.  Whip in arterial line, going by cuff.  - Add Coreg 6.25 mg bid.   CRITICAL CARE Performed by: Loralie Champagne  Total critical care time: 40 minutes  Critical care time was exclusive of separately billable procedures and treating other patients.  Critical care was necessary to treat or prevent imminent or life-threatening deterioration.  Critical care was time spent personally by me on the following activities: development of treatment plan with patient and/or surrogate as well as nursing, discussions with consultants, evaluation of patient's response to treatment, examination of patient, obtaining history from patient or surrogate, ordering and performing treatments and interventions, ordering and review of laboratory studies, ordering and review of radiographic studies, pulse oximetry and re-evaluation of patient's condition.  Loralie Champagne MD 05/29/2021 7:41 AM

## 2021-05-30 ENCOUNTER — Inpatient Hospital Stay (HOSPITAL_COMMUNITY): Payer: PPO

## 2021-05-30 DIAGNOSIS — I249 Acute ischemic heart disease, unspecified: Secondary | ICD-10-CM | POA: Diagnosis not present

## 2021-05-30 DIAGNOSIS — I213 ST elevation (STEMI) myocardial infarction of unspecified site: Secondary | ICD-10-CM | POA: Diagnosis not present

## 2021-05-30 LAB — BASIC METABOLIC PANEL
Anion gap: 8 (ref 5–15)
Anion gap: 6 (ref 5–15)
BUN: 77 mg/dL — ABNORMAL HIGH (ref 8–23)
BUN: 75 mg/dL — ABNORMAL HIGH (ref 8–23)
CO2: 34 mmol/L — ABNORMAL HIGH (ref 22–32)
CO2: 33 mmol/L — ABNORMAL HIGH (ref 22–32)
Calcium: 8.1 mg/dL — ABNORMAL LOW (ref 8.9–10.3)
Calcium: 8.1 mg/dL — ABNORMAL LOW (ref 8.9–10.3)
Chloride: 110 mmol/L (ref 98–111)
Chloride: 113 mmol/L — ABNORMAL HIGH (ref 98–111)
Creatinine, Ser: 1.14 mg/dL (ref 0.61–1.24)
Creatinine, Ser: 1.16 mg/dL (ref 0.61–1.24)
GFR, Estimated: >60 mL/min (ref >60)
GFR, Estimated: >60 mL/min (ref >60)
Glucose, Bld: 135 mg/dL — ABNORMAL HIGH (ref 70–99)
Glucose, Bld: 135 mg/dL — ABNORMAL HIGH (ref 70–99)
Potassium: 3.8 mmol/L (ref 3.5–5.1)
Potassium: 4 mmol/L (ref 3.5–5.1)
Sodium: 152 mmol/L — ABNORMAL HIGH (ref 135–145)
Sodium: 152 mmol/L — ABNORMAL HIGH (ref 135–145)

## 2021-05-30 LAB — CULTURE, RESPIRATORY W GRAM STAIN
Culture: NO GROWTH
Gram Stain: NONE SEEN

## 2021-05-30 LAB — CBC
HCT: 28.3 % — ABNORMAL LOW (ref 39.0–52.0)
HCT: 26 % — ABNORMAL LOW (ref 39.0–52.0)
Hemoglobin: 8.8 g/dL — ABNORMAL LOW (ref 13.0–17.0)
Hemoglobin: 8.1 g/dL — ABNORMAL LOW (ref 13.0–17.0)
MCH: 28.9 pg (ref 26.0–34.0)
MCH: 28.6 pg (ref 26.0–34.0)
MCHC: 31.1 g/dL (ref 30.0–36.0)
MCHC: 31.2 g/dL (ref 30.0–36.0)
MCV: 92.8 fL (ref 80.0–100.0)
MCV: 91.9 fL (ref 80.0–100.0)
Platelets: 186 10*3/uL (ref 150–400)
Platelets: 186 10*3/uL (ref 150–400)
RBC: 3.05 MIL/uL — ABNORMAL LOW (ref 4.22–5.81)
RBC: 2.83 MIL/uL — ABNORMAL LOW (ref 4.22–5.81)
RDW: 16.9 % — ABNORMAL HIGH (ref 11.5–15.5)
RDW: 17 % — ABNORMAL HIGH (ref 11.5–15.5)
WBC: 14.3 10*3/uL — ABNORMAL HIGH (ref 4.0–10.5)
WBC: 14.2 10*3/uL — ABNORMAL HIGH (ref 4.0–10.5)
nRBC: 0 % (ref 0.0–0.2)
nRBC: 0 % (ref 0.0–0.2)

## 2021-05-30 LAB — HEPATIC FUNCTION PANEL
ALT: 102 U/L — ABNORMAL HIGH (ref 0–44)
AST: 36 U/L (ref 15–41)
Albumin: 2.4 g/dL — ABNORMAL LOW (ref 3.5–5.0)
Alkaline Phosphatase: 105 U/L (ref 38–126)
Bilirubin, Direct: 0.2 mg/dL (ref 0.0–0.2)
Indirect Bilirubin: 0.7 mg/dL (ref 0.3–0.9)
Total Bilirubin: 0.9 mg/dL (ref 0.3–1.2)
Total Protein: 4.2 g/dL — ABNORMAL LOW (ref 6.5–8.1)

## 2021-05-30 LAB — POCT I-STAT 7, (LYTES, BLD GAS, ICA,H+H)
Acid-Base Excess: 9 mmol/L — ABNORMAL HIGH (ref 0.0–2.0)
Acid-Base Excess: 7 mmol/L — ABNORMAL HIGH (ref 0.0–2.0)
Acid-Base Excess: 8 mmol/L — ABNORMAL HIGH (ref 0.0–2.0)
Acid-Base Excess: 8 mmol/L — ABNORMAL HIGH (ref 0.0–2.0)
Bicarbonate: 34.9 mmol/L — ABNORMAL HIGH (ref 20.0–28.0)
Bicarbonate: 33 mmol/L — ABNORMAL HIGH (ref 20.0–28.0)
Bicarbonate: 33.3 mmol/L — ABNORMAL HIGH (ref 20.0–28.0)
Bicarbonate: 32.6 mmol/L — ABNORMAL HIGH (ref 20.0–28.0)
Calcium, Ion: 1.23 mmol/L (ref 1.15–1.40)
Calcium, Ion: 1.19 mmol/L (ref 1.15–1.40)
Calcium, Ion: 1.24 mmol/L (ref 1.15–1.40)
Calcium, Ion: 1.21 mmol/L (ref 1.15–1.40)
HCT: 28 % — ABNORMAL LOW (ref 39.0–52.0)
HCT: 23 % — ABNORMAL LOW (ref 39.0–52.0)
HCT: 24 % — ABNORMAL LOW (ref 39.0–52.0)
HCT: 22 % — ABNORMAL LOW (ref 39.0–52.0)
Hemoglobin: 9.5 g/dL — ABNORMAL LOW (ref 13.0–17.0)
Hemoglobin: 7.8 g/dL — ABNORMAL LOW (ref 13.0–17.0)
Hemoglobin: 8.2 g/dL — ABNORMAL LOW (ref 13.0–17.0)
Hemoglobin: 7.5 g/dL — ABNORMAL LOW (ref 13.0–17.0)
O2 Saturation: 97 %
O2 Saturation: 95 %
O2 Saturation: 94 %
O2 Saturation: 96 %
Patient temperature: 98.5
Patient temperature: 37
Patient temperature: 37
Patient temperature: 36.7
Potassium: 3.8 mmol/L (ref 3.5–5.1)
Potassium: 4 mmol/L (ref 3.5–5.1)
Potassium: 3.9 mmol/L (ref 3.5–5.1)
Potassium: 3.9 mmol/L (ref 3.5–5.1)
Sodium: 152 mmol/L — ABNORMAL HIGH (ref 135–145)
Sodium: 150 mmol/L — ABNORMAL HIGH (ref 135–145)
Sodium: 151 mmol/L — ABNORMAL HIGH (ref 135–145)
Sodium: 153 mmol/L — ABNORMAL HIGH (ref 135–145)
TCO2: 36 mmol/L — ABNORMAL HIGH (ref 22–32)
TCO2: 35 mmol/L — ABNORMAL HIGH (ref 22–32)
TCO2: 35 mmol/L — ABNORMAL HIGH (ref 22–32)
TCO2: 34 mmol/L — ABNORMAL HIGH (ref 22–32)
pCO2 arterial: 53.5 mmHg — ABNORMAL HIGH (ref 32.0–48.0)
pCO2 arterial: 52.3 mmHg — ABNORMAL HIGH (ref 32.0–48.0)
pCO2 arterial: 51.1 mmHg — ABNORMAL HIGH (ref 32.0–48.0)
pCO2 arterial: 47.2 mmHg (ref 32.0–48.0)
pH, Arterial: 7.422 (ref 7.350–7.450)
pH, Arterial: 7.409 (ref 7.350–7.450)
pH, Arterial: 7.422 (ref 7.350–7.450)
pH, Arterial: 7.446 (ref 7.350–7.450)
pO2, Arterial: 89 mmHg (ref 83.0–108.0)
pO2, Arterial: 77 mmHg — ABNORMAL LOW (ref 83.0–108.0)
pO2, Arterial: 70 mmHg — ABNORMAL LOW (ref 83.0–108.0)
pO2, Arterial: 80 mmHg — ABNORMAL LOW (ref 83.0–108.0)

## 2021-05-30 LAB — COOXEMETRY PANEL
Carboxyhemoglobin: 1.7 % — ABNORMAL HIGH (ref 0.5–1.5)
Methemoglobin: 1.1 % (ref 0.0–1.5)
O2 Saturation: 81.3 %
Total hemoglobin: 8.6 g/dL — ABNORMAL LOW (ref 12.0–16.0)

## 2021-05-30 LAB — GLUCOSE, CAPILLARY
Glucose-Capillary: 125 mg/dL — ABNORMAL HIGH (ref 70–99)
Glucose-Capillary: 129 mg/dL — ABNORMAL HIGH (ref 70–99)
Glucose-Capillary: 133 mg/dL — ABNORMAL HIGH (ref 70–99)
Glucose-Capillary: 140 mg/dL — ABNORMAL HIGH (ref 70–99)
Glucose-Capillary: 146 mg/dL — ABNORMAL HIGH (ref 70–99)
Glucose-Capillary: 147 mg/dL — ABNORMAL HIGH (ref 70–99)
Glucose-Capillary: 163 mg/dL — ABNORMAL HIGH (ref 70–99)
Glucose-Capillary: 177 mg/dL — ABNORMAL HIGH (ref 70–99)

## 2021-05-30 LAB — PROTIME-INR
INR: 2.2 — ABNORMAL HIGH (ref 0.8–1.2)
Prothrombin Time: 24 seconds — ABNORMAL HIGH (ref 11.4–15.2)

## 2021-05-30 LAB — LACTATE DEHYDROGENASE: LDH: 301 U/L — ABNORMAL HIGH (ref 98–192)

## 2021-05-30 LAB — FIBRINOGEN: Fibrinogen: 379 mg/dL (ref 210–475)

## 2021-05-30 LAB — APTT
aPTT: 52 seconds — ABNORMAL HIGH (ref 24–36)
aPTT: 58 seconds — ABNORMAL HIGH (ref 24–36)

## 2021-05-30 MED ORDER — FUROSEMIDE 10 MG/ML IJ SOLN
60.0000 mg | Freq: Two times a day (BID) | INTRAMUSCULAR | Status: DC
  Administered 2021-05-30: 60 mg via INTRAVENOUS
  Filled 2021-05-30: qty 6

## 2021-05-30 MED ORDER — FUROSEMIDE 10 MG/ML IJ SOLN
20.0000 mg | Freq: Two times a day (BID) | INTRAMUSCULAR | Status: AC
  Administered 2021-05-30: 20 mg via INTRAVENOUS
  Filled 2021-05-30: qty 2

## 2021-05-30 MED ORDER — ALBUMIN HUMAN 25 % IV SOLN
12.5000 g | INTRAVENOUS | Status: DC | PRN
  Administered 2021-05-30 – 2021-06-10 (×12): 12.5 g via INTRAVENOUS
  Filled 2021-05-30 (×12): qty 50

## 2021-05-30 MED ORDER — CARVEDILOL 12.5 MG PO TABS
12.5000 mg | ORAL_TABLET | Freq: Two times a day (BID) | ORAL | Status: DC
  Administered 2021-05-30 – 2021-06-01 (×6): 12.5 mg
  Filled 2021-05-30 (×6): qty 1

## 2021-05-30 MED ORDER — SENNOSIDES 8.8 MG/5ML PO SYRP: 5.0000 mL | ORAL_SOLUTION | Freq: Every day | ORAL | Status: DC | PRN

## 2021-05-30 MED ORDER — DOCUSATE SODIUM 50 MG/5ML PO LIQD: 100.0000 mg | Freq: Two times a day (BID) | ORAL | Status: DC | PRN

## 2021-05-30 MED ORDER — NUTRISOURCE FIBER PO PACK
1.0000 | PACK | Freq: Two times a day (BID) | ORAL | Status: DC
  Administered 2021-05-30 – 2021-05-31 (×4): 1
  Filled 2021-05-30 (×5): qty 1

## 2021-05-30 MED ORDER — POTASSIUM CHLORIDE 20 MEQ PO PACK
40.0000 meq | PACK | Freq: Once | ORAL | Status: AC
  Administered 2021-05-30: 40 meq
  Filled 2021-05-30: qty 2

## 2021-05-30 NOTE — Progress Notes (Addendum)
Patient ID: Randall Hodges, male   DOB: 1950-07-25, 71 y.o.   MRN: [id number]   Progress Note  Patient Name: Randall Hodges Date of Encounter: 05/30/2021  [facility] Cardiologist: [name], MD   Subjective   2/2: VV ECMO initiation, Crescent catheter right IJ 2/7: Tracheostomy  CT C/A/P 2/13: With diffuse ground-glass opacities consistent with atelectasis and edema presumably related to ECMO physiology and respiratory failure.Underlying moderate interstitial lung disease mildly progressed from prior CT.  Remains on vent. On precedex for sedation. Will wake up and follow commands.   He is in NSR on amiodarone gtt.  Bivalirudin gtt for ECMO circuit. Off pressors.   LDH 615 -> 380 -> 428 -> 391 -> 354 -> 313 -> 327 -> 345 -> 301  Had massive vomiting overnight. TFs stopped. Now back on.  Vent FiO2 0.5. ABG ok CXR and CT with diffuse lung infiltrates  ECMO: Speed 2700 rpm Flow 3.4 L/min pVen -42 DeltaP 13 Sweep 3 ->  PTT 52 (goal 50-80) LDH 301 ABG 7.42/53.5/89/97% SVO2 77-79%  Cardiac Studies: Echo (limited, 1/30): Echo reviewed, there is clear respirophasic variation of the interventricular septum and marked respirophasic variation of E inflow velocity on doppler evaluation of the mitral valve.  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  LV EF 45-50%.   RHC Procedural Findings (on norepinephrine 6): Hemodynamics (mmHg) RA mean 12 RV 37/12 PA 38/16, mean 27 PCWP mean 11 LV 108/12 AO 96/55 PAPI 1.8 Oxygen saturations: PA 54% AO 94% Cardiac Output (Fick) 5.59  Cardiac Index (Fick) 2.81  PVR 2.8 WU Simultaneous RV/LV tracings were obtained.  Difficult to interpret due to atrial fibrillation.  There was some suggestion of discordance (ventricular interdependence) but not  clear.  Inpatient Medications    Scheduled Meds:  amLODipine  10 mg Per NG tube Daily   aspirin  81 mg Per Tube Daily   baclofen  5 mg Per Tube TID   carvedilol  12.5 mg Per Tube BID WC   chlorhexidine gluconate (MEDLINE KIT)  15 mL Mouth Rinse BID   Chlorhexidine Gluconate Cloth  6 each Topical Daily   colchicine  0.6 mg Per Tube Daily   feeding supplement (PROSource TF)  45 mL Per Tube TID   fiber  1 packet Per Tube BID   free water  300 mL Per Tube Q4H   furosemide  60 mg Intravenous BID   guaiFENesin  30 mL Per Tube QID   hydrALAZINE  25 mg Per Tube TID   insulin aspart  0-15 Units Subcutaneous Q4H   mouth rinse  15 mL Mouth Rinse 10 times per day   methylPREDNISolone (SOLU-MEDROL) injection  40 mg Intravenous Q24H   Followed by   Derrill Memo ON 06/05/2021] methylPREDNISolone (SOLU-MEDROL) injection  20 mg Intravenous Q24H   pantoprazole sodium  40 mg Per Tube BID   QUEtiapine  25 mg Per Tube QHS   rosuvastatin  10 mg Per Tube Daily   sodium chloride flush  10-40 mL Intracatheter Q12H   sodium chloride flush  10-40 mL Intracatheter Q12H   sodium chloride flush  3 mL Intravenous Q12H   Continuous Infusions:  sodium chloride Stopped (05/26/21 0138)   albumin human 12.5 g (05/30/21 0708)   amiodarone 30 mg/hr (05/30/21 0600)   bivalirudin (ANGIOMAX) infusion 0.5 mg/mL (Non-ACS indications) 0.063 mg/kg/hr (05/30/21 0600)  dexmedetomidine (PRECEDEX) IV infusion Stopped (05/27/21 0533)   feeding supplement (VITAL 1.5 CAL) 1,000 mL (05/29/21 0930)   PRN Meds: Place/Maintain arterial line **AND** sodium chloride, acetaminophen (TYLENOL) oral liquid 160 mg/5 mL, albumin human, diazepam, docusate, fentaNYL (SUBLIMAZE) injection, levalbuterol, ondansetron (ZOFRAN) IV, oxyCODONE, polyvinyl alcohol, sennosides, sodium chloride flush   Vital Signs    Vitals:   05/30/21 0600 05/30/21 0700 05/30/21 0734 05/30/21 0825  BP: (!) 150/51 (!) 140/50 (!) 159/49   Pulse: 83 79 78   Resp: (!) 22  15 (!) 25   Temp:    97.9 F (36.6 C)  TempSrc:      SpO2: 100% 100% 97%   Weight: 83.5 kg     Height:        Intake/Output Summary (Last 24 hours) at 05/30/2021 0842 Last data filed at 05/30/2021 1610 Gross per 24 hour  Intake 3123.53 ml  Output 3590 ml  Net -466.47 ml    Last 3 Weights 05/30/2021 05/29/2021 05/28/2021  Weight (lbs) 184 lb 1.4 oz 177 lb 7.5 oz 177 lb 4 oz  Weight (kg) 83.5 kg 80.5 kg 80.4 kg      Telemetry    NSR 70-80s (personally reviewed)  Physical Exam   General: NAD, RIJ Crescent HEENT: normal Neck: supple.+ trach CVP 13 Cor: PMI nondisplaced. Regular rate & rhythm. No rubs, gallops or murmurs. Lungs: coarse Abdomen: soft, nontender, nondistended. No hepatosplenomegaly. No bruits or masses. Good bowel sounds. Extremities: no cyanosis, clubbing, rash, 2+ edema Neuro: sedated. Arousable    Labs    High Sensitivity Troponin:   Recent Labs  Lab 05/13/2021 1321 05/02/2021 1518  TROPONINIHS 3 4      Chemistry Recent Labs  Lab 05/25/21 0344 05/25/21 0528 05/26/21 0328 05/26/21 9604 05/27/21 0350 05/27/21 0546 05/28/21 0400 05/28/21 0957 05/29/21 0424 05/29/21 1143 05/29/21 1559 05/29/21 1600 05/30/21 0135 05/30/21 0436  NA 147*   < > 150*   150*   < > 150*   < > 148*   148*   < > 148*   148*   < > 150* 149* 152* 152*  K 4.1   < > 4.3   4.2   < > 4.7   < > 4.6   4.6   < > 4.3   4.5   < > 4.1 4.1 3.8 3.8  CL 105   < > 109   < > 114*   < > 109   < > 109  --  111  --   --  110  CO2 35*   < > 35*   < > 31   < > 32   < > 33*  --  33*  --   --  34*  GLUCOSE 173*   < > 181*   < > 185*   < > 186*   < > 186*  --  158*  --   --  135*  BUN 58*   < > 56*   < > 55*   < > 56*   < > 62*  --  67*  --   --  77*  CREATININE 1.17   < > 1.10   < > 1.02   < > 1.04   < > 1.03  --  0.99  --   --  1.14  CALCIUM 7.8*   < > 7.8*   < > 7.9*   < > 8.0*   < > 7.9*  --  8.0*  --   --  8.1*  MG 2.1  --  2.2  --  2.3  --   --   --   --   --   --   --   --   --   PROT  4.4*  --  4.0*  --  4.2*  --  4.5*  --  4.4*  --   --   --   --  4.2*  ALBUMIN 2.0*  --  1.9*  --  2.0*  --  2.1*  --  2.4*  --   --   --   --  2.4*  AST 165*  --  196*  --  86*  --  38  --  35  --   --   --   --  36  ALT 274*  --  387*  --  306*  --  220*  --  140*  --   --   --   --  102*  ALKPHOS 155*  --  189*  --  185*  --  151*  --  116  --   --   --   --  105  BILITOT 0.7  --  1.9*  --  1.3*  --  0.9  --  0.7  --   --   --   --  0.9  GFRNONAA >60   < > >60   < > >60   < > >60   < > >60  --  >60  --   --  >60  ANIONGAP 7   < > 6   < > 5   < > 7   < > 6  --  6  --   --  8   < > = values in this interval not displayed.     Lipids  No results for input(s): CHOL, TRIG, HDL, LABVLDL, LDLCALC, CHOLHDL in the last 168 hours.   Hematology Recent Labs  Lab 05/29/21 0424 05/29/21 1143 05/29/21 1559 05/29/21 1600 05/30/21 0135 05/30/21 0436  WBC 16.8*  --  12.1*  --   --  14.3*  RBC 3.22*  --  2.97*  --   --  3.05*  HGB 9.3*   9.5*   < > 8.4* 8.8* 9.5* 8.8*  HCT 30.4*   28.0*   < > 27.9* 26.0* 28.0* 28.3*  MCV 94.4  --  93.9  --   --  92.8  MCH 28.9  --  28.3  --   --  28.9  MCHC 30.6  --  30.1  --   --  31.1  RDW 17.0*  --  17.0*  --   --  16.9*  PLT 212  --  187  --   --  186   < > = values in this interval not displayed.    Thyroid No results for input(s): TSH, FREET4 in the last 168 hours.  BNPNo results for input(s): BNP, PROBNP in the last 168 hours.  DDimer No results for input(s): DDIMER in the last 168 hours.   Radiology    DG CHEST PORT 1 VIEW  Result Date: 05/29/2021 CLINICAL DATA:  Tracheostomy, on ECMO. EXAM: PORTABLE CHEST 1 VIEW COMPARISON:  May 28, 2021. FINDINGS: Stable cardiomediastinal silhouette. Tracheostomy and feeding tube are in good position. ECMO device is again noted. Status post coronary bypass graft. Stable bilateral lung opacities are noted which may represent atelectasis or edema. Small pleural effusions may be present. Bony thorax  is  unremarkable. IMPRESSION: Stable support apparatus.  Stable bibasilar opacities. Electronically Signed   By: Marijo Conception M.D.   On: 05/29/2021 08:10   CT CHEST ABDOMEN PELVIS WO CONTRAST  Result Date: 05/29/2021 CLINICAL DATA:  Eval for pulm fibrosis/cpEval for pulm fibrosis EXAM: CT CHEST, ABDOMEN AND PELVIS WITHOUT CONTRAST TECHNIQUE: Multidetector CT imaging of the chest, abdomen and pelvis was performed following the standard protocol without IV contrast. RADIATION DOSE REDUCTION: This exam was performed according to the departmental dose-optimization program which includes automated exposure control, adjustment of the mA and/or kV according to patient size and/or use of iterative reconstruction technique. COMPARISON:  Chest CT 06/02/2017 FINDINGS: CT CHEST FINDINGS Cardiovascular: Post CABG. ECMO cannulation catheter extends through the RIGHT atrium into the IVC. RIGHT PICC line with tip in the distal SVC. Trace pericardial effusion Mediastinum/Nodes: Tracheostomy tube in good position mid trachea. Feeding tube extends to the esophagus. No mediastinal adenopathy. No axillary supraclavicular adenopathy Lungs/Pleura: diffuse ground-glass opacities in lungs consistent with atelectasis and mild edema. Small bilateral pleural effusions. Underlying subpleural interstitial lung disease is difficult to evaluate the background of diffuse ground-glass densities. Interstitial lung disease moderately progressed from 2019. Musculoskeletal: Midline sternotomy CT ABDOMEN AND PELVIS FINDINGS Hepatobiliary: Normal liver. High-density fluid in the gallbladder related to prior cardiac catheterization (vicarious excretion of contrast). Pancreas: Pancreas is normal. No ductal dilatation. No pancreatic inflammation. Spleen: Normal spleen Adrenals/urinary tract: Adrenal glands and kidneys are normal. The ureters and bladder normal. Foley catheter in place Stomach/Bowel: Feeding tube extends through the stomach into the fourth  portion the duodenum. No small bowel dilatation or obstruction. Fluid stool in the colon. Rectum normal. Rectal tube in place. Vascular/Lymphatic: Abdominal aorta is normal caliber with atherosclerotic calcification. There is no retroperitoneal or periportal lymphadenopathy. No pelvic lymphadenopathy. Reproductive: Unremarkable Other: No free fluid. Musculoskeletal: No aggressive osseous lesion. IMPRESSION: Chest Impression: 1. Support apparatus appears in good position. ECMO cannulation extends through the RIGHT atrium. 2. Diffuse ground-glass opacities consistent with atelectasis and edema presumably related to ECMO physiology and respiratory failure. 3. Underlying moderate interstitial lung disease mildly progressed from prior CT. 4. Bilateral pleural effusions. 5. Post CABG without complication. Abdomen / Pelvis Impression: 1. Feeding tube extends in to the fourth portion duodenum. 2. No acute findings abdomen pelvis. Electronically Signed   By: Suzy Bouchard M.D.   On: 05/29/2021 09:43    Cardiac Studies   Cath 04/19/2021 Distal left main [place] 111 bifurcation stenosis with 75% left main, 90% ostial to proximal LAD, and 80-90% ostial circumflex (difficult to assess due to heavy calcification). Severe mid circumflex disease with 70% eccentric mid stenosis and second obtuse marginal containing ostial to proximal greater than 80% stenosis.  (Bifurcation [place] 111 Severe calcification in left main and LAD in particular with diffuse 50% narrowing from proximal to mid vessel and tandem 70% stenoses in the mid LAD. Nondominant right coronary Normal LV function.  EF 55%.  LVEDP normal.    Patient Profile     71 y.o. male with PMH of PVCs presented with chest pain. Cardiac cath by Dr. Tamala Julian on 05/02/2021 showed 75% left main, 90% ost to prox LAD, 80-90% ost LCx, 70% mid LCx, 80% OM2, 50% prox to mid LAD, 70% mid LAD, EF 55%. Patient underwent CABG x 3 on 05/13/2021. Post op course complicated afib,  treated with amio. CXR showed opacity in bilateral lung, started abx on 1/25 and diuretic. Started on Eliquis due to recurrence of afib.    Assessment &  Plan    1. CAD: Admitted with unstable angina, cath with severe left main and proximal LAD/LCx disease (nondominant RCA).  CABG x 3 on 1/18 with LIMA-LAD, SVG-OM, SVG-left PDA.  No s/s angina  - Continue ASA 81, statin.  2. 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 RHC 1/30 with equalization of diastolic pressures.  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  Repeated echo 2/2 still showed respirophasic septal variation but not as impressive. CVP 13 today. Weight up but appears inaccurate - Acetazolamide stopped. Continue Lasix 60 mg IV x 1 along with albumin.  - With concern for development of post-surgical effusive/constrictive pericarditis, he is on colchicine.  - ?need for surgical intervention on effusive/constrictive pericarditis. Will need improvement of lung disease first then reassess, repeat echo not as impressive.   3. 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  Suspect primarily septic/vasodilatory shock. He is currently off vasopressin and NE with MAP now high. 4. Hiccups: Still present.  - On baclofen.  Consider gabapentin 5. PNA: Of note, he does appear to have had some pre-existing ILD from 2019 CT chest (?sarcoidosis). CXR with persistent bilateral infiltrates, possible mild improvement compared to yesterday.  Have thought most likely aspiration PNA/pneumonitis in setting of  intractable hiccups. He has developred ARDS.  Afebrile. COVID was negative.  FiO2 0.5 on vent.  We have struggled to wean sweep but down to 3 today. CXR similar with bilateral diffuse airspace disease. Now with tracheostomy. CT 2/9 with persistent lung infiltrates - Sweep back up to 4. ABG ok  - Discussed with pulmonary, think amiodarone toxicity unlikely as he has only been on it post-op. However they have asked to hold for now. Will watch closely  - Completed vancomycin and meropenem.   - Vent per CCM.  - Solumedrol 40 mg IV bid, ESR now normal.  6. Atypical atrial flutter/PVCs:  Remains in NSR - Stopping amio per ccm. May need to restart - Bivalirudin gtt, goal PTT 50-80. Discussed dosing with PharmD personally. 7. AKI: Creatinine improved to 1.03.  Follow closely.  8. Elevated LFTs: Suspect shock liver, follow CMET.  LFTs trending down.  9. Anemia:hgb at 9.3 -> 8.8. 10. Hypernatremia: Continue free water 300 cc q4 hrs.  11. HTN: Now on amlodipine and hydralazine.  Whip in arterial line, going by cuff.  - Increase Coreg 6.25  CRITICAL CARE Performed by: Glori Bickers  Total critical care time: 35 minutes  Critical care time was exclusive of separately billable procedures and treating other patients.  Critical care was necessary to treat or prevent imminent or life-threatening deterioration.  Critical care was time spent personally by me on the following activities: development of treatment plan with patient and/or surrogate as well as nursing, discussions with consultants, evaluation of patient's response to treatment, examination of patient, obtaining history from patient or surrogate, ordering and performing treatments and interventions, ordering and review of laboratory studies, ordering and review of radiographic studies, pulse oximetry and re-evaluation of patient's condition.  Glori Bickers MD 05/30/2021 8:42 AM

## 2021-05-30 NOTE — Progress Notes (Deleted)
.  ecmo

## 2021-05-30 NOTE — Progress Notes (Signed)
Patient vomited twice. Zofran given and tube feeding held after patient had another emesis episode. Tube feeding resumed after 4hours

## 2021-05-30 NOTE — Progress Notes (Signed)
Patient ID: Randall Hodges, male   DOB: Jul 03, 1950, 71 y.o.   MRN: [id number] Extracorporeal support note   ECLS support day: 9 Indication: ARDS  Configuration: VV  Drainage cannula: Crescent R IJ Return cannula: Crescent R IJ  Pump speed: 2700 rpm Pump flow: 3.4 L/min Pump used: Cardiohelp  Sweep gas: 4  ABG ok   Circuit check: Small amount thrombus. Good color change. LDH stable.  Anticoagulant: Bivalirudin, PTT goal 50-80. PTT 53  Changes in support: No change today  Anticipated goals/duration of support: Wean to discontinuation  Glori Bickers, MD  8:55 AM

## 2021-05-30 NOTE — Progress Notes (Addendum)
ANTICOAGULATION CONSULT NOTE  Pharmacy Consult for Bivalirudin Indication:  ECMO  Allergies  Allergen Reactions   Amoxicillin-Pot Clavulanate     Per pt report on 08/19/20, makes his urine dark colored   Atorvastatin Other (See Comments)     ( pt states causing runny nose, headaches, issue w/ urination)   Plant Derived Enzymes     Other reaction(s): Unknown   Trichophyton Other (See Comments)    Patient Measurements: Height: 5\' 10"  (177.8 cm) Weight: 83.5 kg (184 lb 1.4 oz) IBW/kg (Calculated) : 73  Vital Signs: Temp: 98.3 F (36.8 C) (02/11 0400) Temp Source: Oral (02/11 0400) BP: 150/51 (02/11 0600) Pulse Rate: 83 (02/11 0600)  Labs: Recent Labs    05/28/21 0400 05/28/21 0957 05/29/21 0424 05/29/21 1143 05/29/21 1559 05/29/21 1600 05/30/21 0135 05/30/21 0436  HGB 10.5*   10.4*   < > 9.3*   9.5*   < > 8.4* 8.8* 9.5* 8.8*  HCT 31.0*   33.6*   < > 30.4*   28.0*   < > 27.9* 26.0* 28.0* 28.3*  PLT 243   < > 212  --  187  --   --  186  APTT 47*   < > 52*  --  54*  --   --  52*  LABPROT 21.9*  --  22.5*  --   --   --   --  24.0*  INR 1.9*  --  2.0*  --   --   --   --  2.2*  CREATININE 1.04   < > 1.03  --  0.99  --   --  1.14   < > = values in this interval not displayed.     Estimated Creatinine Clearance: 61.4 mL/min (by C-G formula based on SCr of 1.14 mg/dL).   Medical History: Past Medical History:  Diagnosis Date   Allergic rhinitis    Prostate cancer ([place])     Medications:  Infusions:   sodium chloride Stopped (05/26/21 0138)   albumin human 12.5 g (05/30/21 0708)   amiodarone 30 mg/hr (05/30/21 0600)   bivalirudin (ANGIOMAX) infusion 0.5 mg/mL (Non-ACS indications) 0.063 mg/kg/hr (05/30/21 0600)   dexmedetomidine (PRECEDEX) IV infusion Stopped (05/27/21 0533)   feeding supplement (VITAL 1.5 CAL) 1,000 mL (05/29/21 0930)    Assessment: 71 year old male with coronary artery disease who initially presented with chest pain, noted to have multivessel  coronary artery disease, he underwent CABG x3 on 05/17/2021, course was complicated with atrial fibrillation, frequent hiccups and aspiration pneumonia leading to hypoxia and increasing oxygen requirement. Patient now requiring VV ECMO. Pharmacy consulted for bivalirudin IV.   aPTT this morning is therapeutic at 52 seconds. H/H low but stable, pltc wnl, LDH and fibrinogen stable.  Goal of Therapy:  aPTT 50-80 seconds Monitor platelets by anticoagulation protocol: Yes   Plan:  Continue bivalirudin at 0.063 mg/kg/hr Monitor q12h CBC, closely for s/sx bleeding  ADDENDUM some fibrin within pump, RNs also noting some low flows, will increase bivalirudin slightly to get closer to middle of aPTT range. -Increase bivalirudin to 0.068 -Repeat APTT at 1700 as scheduled  Arrie Senate, PharmD, BCPS, [facility] Clinical Pharmacist [phone] Please check AMION for all [place] numbers 05/30/2021

## 2021-05-30 NOTE — Progress Notes (Signed)
0700 - Report received from [place], [place].  All questions answered.  Safety checks performed.  All lines and drips verified. Hand hygiene performed before/after each pt contact. ECMO chugging.  Albumin given. Fentanyl IV drip wasted with Trilby Drummer, RN in the [facility] and documented in the [place]. 0730 - Dr. Lynetta Mare rounded. 0800 - Assessment & Rx. 0830 - Pt's wife, Pam, called.  Pam updated.  All questions answered. 0900 - Dr. Haroldine Laws rounded.  Classical music initiated.  Pt placed in chair position in bed.  1130 - ECMO chugging and alarming frequently, despite CVP being elevated.  Pt given concentrated albumin and repositioned.  Chugging stopped.  Discussed potential for clotting with low flows with Legrand Como, [facility].  We compared that with the Bival dose being at the lower end of therapeutic and potential for increasing the dose to help prevent clotting.   1145 - Dr. Lynetta Mare rounded.  Okayed to cut stitches from prior chest tube sites. 1240 - Pt's pastor stopped by for a visit. 1310 - Pt's pastor left.  1315 - Pt's wife, Jeannene Patella, arrived to visit.  Pam updated.  All questions answered.  Pam concerned that other family members have been updated with pt's condition.  She advised that she wanted there to be only four people who have updates: her, their son Shanon Brow), their daughter Kristin Bruins), and her cousin Pamala Hurry).  She requested that pt be a 'no information patient'.  Philis Kendall, Agricultural consultant updated with that information. 45 - Pt's wife left.  Pt bathed.  Slight oozing noted from ECMO cannula. 1715 - Pt's son, Shanon Brow, arrived to visit.  Shanon Brow updated.  All questions answered.  Shanon Brow advised that pt likes gospel music.  Music changed to instrumental gospel music. 78 - Pt's brother arrived.  Shanon Brow updated pt's brother. 1800 Shanon Brow and pt's brother left.  Oozing from ECMO cannula stable. 1815 - Slightly more oozing noted from ECMO cannula.  Dr. Lynetta Mare notified.  He advised that if it keeps oozing, Bival dose  may need to be decreased. 1845 - Oozing escaped tegaderm.  Surgicel utilized with dressing change.  [place] Alan, RN given report.  All questions answered.

## 2021-05-30 NOTE — Progress Notes (Signed)
NAME:  Randall Hodges, MRN:  [id number], DOB:  08/31/1950, LOS: 62 ADMISSION DATE:  04/23/2021, CONSULTATION DATE: 05/17/2021 REFERRING MD:  Dahlia Byes, MD, CHIEF COMPLAINT: Increasing shortness of breath  History of Present Illness:  71 year old male with coronary artery disease who initially presented with chest pain, noted to have multivessel coronary artery disease, he underwent CABG x3 on 04/1818 23, course was complicated with atrial fibrillation, frequent hiccups and aspiration pneumonia leading to hypoxia and increasing oxygen requirement. PCCM was consulted for evaluation and help with management  Patient stated hiccups are better but continued complain of shortness of breath, cough unable to bring up phlegm.  Pertinent  Medical History   Past Medical History:  Diagnosis Date   Allergic rhinitis    Prostate cancer ([place])     Significant Hospital Events: Including procedures, antibiotic start and stop dates in addition to other pertinent events   1/18 CABG 1/30 PCCM consult, Worsening O2 needs, likely aspiration, new pressor need 2/2 intubation, placed on VV ECMO 2/5 issues with recirc, bronch neg, improved with supine positioning 2/7 percutaneous tracheostomy 2/10 diuresis and weaning sedation. CT chest shows diffuse ground-glass opacification.   Interim History / Subjective:  Episode of emesis last night. Received albumin for episode of chugging.  Hiccups improving with baclofen.   Objective   Blood pressure 133/65, pulse 69, temperature 97.9 F (36.6 C), resp. rate 15, height 5\' 10"  (1.778 m), weight 83.5 kg, SpO2 99 %. CVP:  [3 mmHg-16 mmHg] 16 mmHg  Vent Mode: PCV FiO2 (%):  [40 %] 40 % Set Rate:  [22 bmp] 22 bmp PEEP:  [10 cmH20] 10 cmH20 Plateau Pressure:  [24 cmH20-28 cmH20] 24 cmH20   Intake/Output Summary (Last 24 hours) at 05/30/2021 1117 Last data filed at 05/30/2021 1100 Gross per 24 hour  Intake 3090.17 ml  Output 3590 ml  Net -499.83 ml     Filed Weights   05/28/21 0500 05/29/21 0600 05/30/21 0600  Weight: 80.4 kg 80.5 kg 83.5 kg     Examination: General: average build appears acutely ill. On no sedative or pressor infusions.  HENT: tracheostomy and Cortrak in place without pressure injury. Cannula site intact.  Lungs: scattered crackles. On PCV with Pplat 24 driving pressure 15. 0.4/10 VV  ECMO at 2550 rpm with sweep 3 Cardiovascular: HS normal  Abdomen: soft, still Flexiseal in place Extremities: edema +2 Neuro: still sedated with limited response to pain.  GU: Foley in place with abundant clear urine  Resolved Hospital Problem list   Postop recurrent shock question constrictive pericaritis type physiology vs. Septic  Assessment & Plan:  CAD with unstable angina s/p CABGx3 1/18 (LIMA-LAD, SVG-OM, SVG-left PDA) Presumed aspiration pneumonitis vs. HCAP vs. ?AEILD culminating in severe ARDS with inability to ventilate along with high Aa gradient s/p VV ECMO cannulation 05/21/22.  CT shows predominant alveolitis - should resolve completely with time.  Need for [facility] for ECMO Atypical aflutter on amio Baseline fibrotic lung disease with preserved PFTs 2019, question of sarcoid- but appears fairly minimal  Plan:   - Continue current sweep and anticipate longer run - Wean sedation and begin mobilizing.  - Continue BID baclofen for hiccups to prevent aspiration - Taper steroids.  - Albumin and diurese w/ lasix as allowed by ECMO circuit - Increase carvedilol for BP and HR control to limit amiodarone exposure.  - PT/OT w/ reduced sedation to promote activity    Best Practice (right click and "Reselect all SmartList Selections" daily)   Diet/type:  Vital @ 28mL/hr DVT prophylaxis: Bival CBG: yes, SSI GI prophylaxis: PPI Lines: L [place] TLC, RIJ  Foley:  Yes, keep Code Status:  full code Last date of multidisciplinary goals of care discussion [daily at bedside]  CRITICAL CARE Performed by: Kipp Brood   Total  critical care time: 50 minutes  Critical care time was exclusive of separately billable procedures and treating other patients.  Critical care was necessary to treat or prevent imminent or life-threatening deterioration.  Critical care was time spent personally by me on the following activities: development of treatment plan with patient and/or surrogate as well as nursing, discussions with consultants, evaluation of patient's response to treatment, examination of patient, obtaining history from patient or surrogate, ordering and performing treatments and interventions, ordering and review of laboratory studies, ordering and review of radiographic studies, pulse oximetry, re-evaluation of patient's condition and participation in multidisciplinary rounds.  Kipp Brood, MD [facility] ICU Physician [place]  Pager: [phone] Mobile: [phone] After hours: [phone].

## 2021-05-30 NOTE — Progress Notes (Signed)
ANTICOAGULATION CONSULT NOTE  Pharmacy Consult for Bivalirudin Indication:  ECMO  Allergies  Allergen Reactions   Amoxicillin-Pot Clavulanate     Per pt report on 08/19/20, makes his urine dark colored   Atorvastatin Other (See Comments)     ( pt states causing runny nose, headaches, issue w/ urination)   Plant Derived Enzymes     Other reaction(s): Unknown   Trichophyton Other (See Comments)    Patient Measurements: Height: 5\' 10"  (177.8 cm) Weight: 83.5 kg (184 lb 1.4 oz) IBW/kg (Calculated) : 73  Vital Signs: Temp: 98.6 F (37 C) (02/11 1600) Temp Source: Core (Comment) (02/11 1600) BP: 139/46 (02/11 1700) Pulse Rate: 80 (02/11 1700)  Labs: Recent Labs    05/28/21 0400 05/28/21 0957 05/29/21 0424 05/29/21 1143 05/29/21 1559 05/29/21 1600 05/30/21 0436 05/30/21 1037 05/30/21 1618 05/30/21 1619  HGB 10.5*   10.4*   < > 9.3*   9.5*   < > 8.4*   < > 8.8* 7.8* 8.1* 8.2*  HCT 31.0*   33.6*   < > 30.4*   28.0*   < > 27.9*   < > 28.3* 23.0* 26.0* 24.0*  PLT 243   < > 212  --  187  --  186  --  186  --   APTT 47*   < > 52*  --  54*  --  52*  --  58*  --   LABPROT 21.9*  --  22.5*  --   --   --  24.0*  --   --   --   INR 1.9*  --  2.0*  --   --   --  2.2*  --   --   --   CREATININE 1.04   < > 1.03  --  0.99  --  1.14  --  1.16  --    < > = values in this interval not displayed.     Estimated Creatinine Clearance: 60.3 mL/min (by C-G formula based on SCr of 1.16 mg/dL).   Medical History: Past Medical History:  Diagnosis Date   Allergic rhinitis    Prostate cancer ([place])     Medications:  Infusions:   sodium chloride Stopped (05/26/21 0138)   albumin human Stopped (05/30/21 1139)   albumin human Stopped (05/30/21 0800)   bivalirudin (ANGIOMAX) infusion 0.5 mg/mL (Non-ACS indications) 0.068 mg/kg/hr (05/30/21 1800)   dexmedetomidine (PRECEDEX) IV infusion Stopped (05/27/21 0533)   feeding supplement (VITAL 1.5 CAL) 1,000 mL (05/30/21 1639)     Assessment: 71 year old male with coronary artery disease who initially presented with chest pain, noted to have multivessel coronary artery disease, he underwent CABG x3 on 05/19/2021, course was complicated with atrial fibrillation, frequent hiccups and aspiration pneumonia leading to hypoxia and increasing oxygen requirement. Patient now requiring VV ECMO. Pharmacy consulted for bivalirudin IV.   aPTT  therapeutic at 58 seconds on increased dose bivalirudin 0.068mg /kg/hr H/H 8s stable, pltc wnl, LDH and fibrinogen stable. fibrin within pump -stable  Goal of Therapy:  aPTT 50-80 seconds Monitor platelets by anticoagulation protocol: Yes   Plan:  Continue bivalirudin at 0.068 mg/kg/hr Monitor q12h CBC, closely for s/sx bleeding    Bonnita Nasuti Pharm.D. CPP, BCPS Clinical Pharmacist [phone] 05/30/2021 7:00 PM   Please check AMION for all [place] numbers 05/30/2021

## 2021-05-31 ENCOUNTER — Inpatient Hospital Stay (HOSPITAL_COMMUNITY): Payer: PPO

## 2021-05-31 DIAGNOSIS — I213 ST elevation (STEMI) myocardial infarction of unspecified site: Secondary | ICD-10-CM | POA: Diagnosis not present

## 2021-05-31 DIAGNOSIS — I249 Acute ischemic heart disease, unspecified: Secondary | ICD-10-CM | POA: Diagnosis not present

## 2021-05-31 LAB — BASIC METABOLIC PANEL
Anion gap: 6 (ref 5–15)
Anion gap: 7 (ref 5–15)
BUN: 78 mg/dL — ABNORMAL HIGH (ref 8–23)
BUN: 79 mg/dL — ABNORMAL HIGH (ref 8–23)
CO2: 32 mmol/L (ref 22–32)
CO2: 31 mmol/L (ref 22–32)
Calcium: 8 mg/dL — ABNORMAL LOW (ref 8.9–10.3)
Calcium: 7.9 mg/dL — ABNORMAL LOW (ref 8.9–10.3)
Chloride: 111 mmol/L (ref 98–111)
Chloride: 113 mmol/L — ABNORMAL HIGH (ref 98–111)
Creatinine, Ser: 1.17 mg/dL (ref 0.61–1.24)
Creatinine, Ser: 1.19 mg/dL (ref 0.61–1.24)
GFR, Estimated: >60 mL/min (ref >60)
GFR, Estimated: >60 mL/min (ref >60)
Glucose, Bld: 174 mg/dL — ABNORMAL HIGH (ref 70–99)
Glucose, Bld: 160 mg/dL — ABNORMAL HIGH (ref 70–99)
Potassium: 4.2 mmol/L (ref 3.5–5.1)
Potassium: 3.8 mmol/L (ref 3.5–5.1)
Sodium: 149 mmol/L — ABNORMAL HIGH (ref 135–145)
Sodium: 151 mmol/L — ABNORMAL HIGH (ref 135–145)

## 2021-05-31 LAB — POCT I-STAT 7, (LYTES, BLD GAS, ICA,H+H)
Acid-Base Excess: 6 mmol/L — ABNORMAL HIGH (ref 0.0–2.0)
Acid-Base Excess: 7 mmol/L — ABNORMAL HIGH (ref 0.0–2.0)
Acid-Base Excess: 7 mmol/L — ABNORMAL HIGH (ref 0.0–2.0)
Acid-Base Excess: 7 mmol/L — ABNORMAL HIGH (ref 0.0–2.0)
Acid-Base Excess: 7 mmol/L — ABNORMAL HIGH (ref 0.0–2.0)
Bicarbonate: 30.4 mmol/L — ABNORMAL HIGH (ref 20.0–28.0)
Bicarbonate: 30.9 mmol/L — ABNORMAL HIGH (ref 20.0–28.0)
Bicarbonate: 31.1 mmol/L — ABNORMAL HIGH (ref 20.0–28.0)
Bicarbonate: 32.1 mmol/L — ABNORMAL HIGH (ref 20.0–28.0)
Bicarbonate: 32.5 mmol/L — ABNORMAL HIGH (ref 20.0–28.0)
Calcium, Ion: 1.2 mmol/L (ref 1.15–1.40)
Calcium, Ion: 1.2 mmol/L (ref 1.15–1.40)
Calcium, Ion: 1.22 mmol/L (ref 1.15–1.40)
Calcium, Ion: 1.22 mmol/L (ref 1.15–1.40)
Calcium, Ion: 1.24 mmol/L (ref 1.15–1.40)
HCT: 21 % — ABNORMAL LOW (ref 39.0–52.0)
HCT: 22 % — ABNORMAL LOW (ref 39.0–52.0)
HCT: 22 % — ABNORMAL LOW (ref 39.0–52.0)
HCT: 22 % — ABNORMAL LOW (ref 39.0–52.0)
HCT: 24 % — ABNORMAL LOW (ref 39.0–52.0)
Hemoglobin: 7.1 g/dL — ABNORMAL LOW (ref 13.0–17.0)
Hemoglobin: 7.5 g/dL — ABNORMAL LOW (ref 13.0–17.0)
Hemoglobin: 7.5 g/dL — ABNORMAL LOW (ref 13.0–17.0)
Hemoglobin: 7.5 g/dL — ABNORMAL LOW (ref 13.0–17.0)
Hemoglobin: 8.2 g/dL — ABNORMAL LOW (ref 13.0–17.0)
O2 Saturation: 91 %
O2 Saturation: 91 %
O2 Saturation: 93 %
O2 Saturation: 94 %
O2 Saturation: 95 %
Patient temperature: 36.6
Patient temperature: 36.6
Patient temperature: 36.7
Patient temperature: 37
Patient temperature: 37
Potassium: 3.9 mmol/L (ref 3.5–5.1)
Potassium: 3.9 mmol/L (ref 3.5–5.1)
Potassium: 4.1 mmol/L (ref 3.5–5.1)
Potassium: 4.7 mmol/L (ref 3.5–5.1)
Potassium: 4.9 mmol/L (ref 3.5–5.1)
Sodium: 150 mmol/L — ABNORMAL HIGH (ref 135–145)
Sodium: 151 mmol/L — ABNORMAL HIGH (ref 135–145)
Sodium: 151 mmol/L — ABNORMAL HIGH (ref 135–145)
Sodium: 151 mmol/L — ABNORMAL HIGH (ref 135–145)
Sodium: 152 mmol/L — ABNORMAL HIGH (ref 135–145)
TCO2: 32 mmol/L (ref 22–32)
TCO2: 32 mmol/L (ref 22–32)
TCO2: 32 mmol/L (ref 22–32)
TCO2: 34 mmol/L — ABNORMAL HIGH (ref 22–32)
TCO2: 34 mmol/L — ABNORMAL HIGH (ref 22–32)
pCO2 arterial: 42.5 mmHg (ref 32.0–48.0)
pCO2 arterial: 42.5 mmHg (ref 32.0–48.0)
pCO2 arterial: 45.1 mmHg (ref 32.0–48.0)
pCO2 arterial: 48.7 mmHg — ABNORMAL HIGH (ref 32.0–48.0)
pCO2 arterial: 49.4 mmHg — ABNORMAL HIGH (ref 32.0–48.0)
pH, Arterial: 7.425 (ref 7.350–7.450)
pH, Arterial: 7.427 (ref 7.350–7.450)
pH, Arterial: 7.437 (ref 7.350–7.450)
pH, Arterial: 7.469 — ABNORMAL HIGH (ref 7.350–7.450)
pH, Arterial: 7.47 — ABNORMAL HIGH (ref 7.350–7.450)
pO2, Arterial: 59 mmHg — ABNORMAL LOW (ref 83.0–108.0)
pO2, Arterial: 62 mmHg — ABNORMAL LOW (ref 83.0–108.0)
pO2, Arterial: 62 mmHg — ABNORMAL LOW (ref 83.0–108.0)
pO2, Arterial: 65 mmHg — ABNORMAL LOW (ref 83.0–108.0)
pO2, Arterial: 76 mmHg — ABNORMAL LOW (ref 83.0–108.0)

## 2021-05-31 LAB — BPAM RBC
Blood Product Expiration Date: 202303032359
Blood Product Expiration Date: 202303032359
Blood Product Expiration Date: 202303032359
Blood Product Expiration Date: 202303042359
ISSUE DATE / TIME: 202302100755
ISSUE DATE / TIME: 202302100755
ISSUE DATE / TIME: 202302100755
ISSUE DATE / TIME: 202302100755
Unit Type and Rh: 5100
Unit Type and Rh: 5100
Unit Type and Rh: 5100
Unit Type and Rh: 5100

## 2021-05-31 LAB — TYPE AND SCREEN
ABO/RH(D): O POS
Antibody Screen: NEGATIVE
Unit division: 0
Unit division: 0
Unit division: 0
Unit division: 0

## 2021-05-31 LAB — APTT
aPTT: 56 seconds — ABNORMAL HIGH (ref 24–36)
aPTT: 59 seconds — ABNORMAL HIGH (ref 24–36)

## 2021-05-31 LAB — CBC
HCT: 25.4 % — ABNORMAL LOW (ref 39.0–52.0)
HCT: 24.8 % — ABNORMAL LOW (ref 39.0–52.0)
Hemoglobin: 8.1 g/dL — ABNORMAL LOW (ref 13.0–17.0)
Hemoglobin: 7.8 g/dL — ABNORMAL LOW (ref 13.0–17.0)
MCH: 29.5 pg (ref 26.0–34.0)
MCH: 29.2 pg (ref 26.0–34.0)
MCHC: 31.9 g/dL (ref 30.0–36.0)
MCHC: 31.5 g/dL (ref 30.0–36.0)
MCV: 92.4 fL (ref 80.0–100.0)
MCV: 92.9 fL (ref 80.0–100.0)
Platelets: 177 10*3/uL (ref 150–400)
Platelets: 178 10*3/uL (ref 150–400)
RBC: 2.75 MIL/uL — ABNORMAL LOW (ref 4.22–5.81)
RBC: 2.67 MIL/uL — ABNORMAL LOW (ref 4.22–5.81)
RDW: 17.2 % — ABNORMAL HIGH (ref 11.5–15.5)
RDW: 17.2 % — ABNORMAL HIGH (ref 11.5–15.5)
WBC: 16.9 10*3/uL — ABNORMAL HIGH (ref 4.0–10.5)
WBC: 15.9 10*3/uL — ABNORMAL HIGH (ref 4.0–10.5)
nRBC: 0 % (ref 0.0–0.2)
nRBC: 0 % (ref 0.0–0.2)

## 2021-05-31 LAB — HEPATIC FUNCTION PANEL
ALT: 83 U/L — ABNORMAL HIGH (ref 0–44)
AST: 43 U/L — ABNORMAL HIGH (ref 15–41)
Albumin: 2.4 g/dL — ABNORMAL LOW (ref 3.5–5.0)
Alkaline Phosphatase: 116 U/L (ref 38–126)
Bilirubin, Direct: 0.7 mg/dL — ABNORMAL HIGH (ref 0.0–0.2)
Indirect Bilirubin: 0.5 mg/dL (ref 0.3–0.9)
Total Bilirubin: 1.2 mg/dL (ref 0.3–1.2)
Total Protein: 4.2 g/dL — ABNORMAL LOW (ref 6.5–8.1)

## 2021-05-31 LAB — HEMOGLOBIN AND HEMATOCRIT, BLOOD
HCT: 23.4 % — ABNORMAL LOW (ref 39.0–52.0)
Hemoglobin: 7.4 g/dL — ABNORMAL LOW (ref 13.0–17.0)

## 2021-05-31 LAB — GLUCOSE, CAPILLARY
Glucose-Capillary: 145 mg/dL — ABNORMAL HIGH (ref 70–99)
Glucose-Capillary: 149 mg/dL — ABNORMAL HIGH (ref 70–99)
Glucose-Capillary: 153 mg/dL — ABNORMAL HIGH (ref 70–99)
Glucose-Capillary: 156 mg/dL — ABNORMAL HIGH (ref 70–99)
Glucose-Capillary: 167 mg/dL — ABNORMAL HIGH (ref 70–99)
Glucose-Capillary: 170 mg/dL — ABNORMAL HIGH (ref 70–99)

## 2021-05-31 LAB — LACTATE DEHYDROGENASE: LDH: 321 U/L — ABNORMAL HIGH (ref 98–192)

## 2021-05-31 LAB — COOXEMETRY PANEL
Carboxyhemoglobin: 2.1 % — ABNORMAL HIGH (ref 0.5–1.5)
Methemoglobin: 1.1 % (ref 0.0–1.5)
O2 Saturation: 84.7 %
Total hemoglobin: 7.9 g/dL — ABNORMAL LOW (ref 12.0–16.0)

## 2021-05-31 LAB — PROTIME-INR
INR: 2.3 — ABNORMAL HIGH (ref 0.8–1.2)
Prothrombin Time: 25.1 seconds — ABNORMAL HIGH (ref 11.4–15.2)

## 2021-05-31 LAB — LACTIC ACID, PLASMA: Lactic Acid, Venous: 1.2 mmol/L (ref 0.5–1.9)

## 2021-05-31 LAB — FIBRINOGEN: Fibrinogen: 362 mg/dL (ref 210–475)

## 2021-05-31 MED ORDER — "THROMBI-PAD 3""X3"" EX PADS"
1.0000 | MEDICATED_PAD | Freq: Once | CUTANEOUS | Status: AC
  Administered 2021-05-31: 1 via TOPICAL
  Filled 2021-05-31: qty 1

## 2021-05-31 MED ORDER — GABAPENTIN 250 MG/5ML PO SOLN
300.0000 mg | Freq: Every day | ORAL | Status: DC
  Administered 2021-05-31 – 2021-06-11 (×12): 300 mg
  Filled 2021-05-31 (×13): qty 6

## 2021-05-31 MED ORDER — FREE WATER
150.0000 mL | Status: DC
  Administered 2021-05-31 – 2021-06-01 (×10): 150 mL

## 2021-05-31 MED ORDER — POTASSIUM CHLORIDE 20 MEQ PO PACK
40.0000 meq | PACK | Freq: Once | ORAL | Status: AC
Start: 2021-05-31 — End: 2021-05-31
  Administered 2021-05-31: 40 meq
  Filled 2021-05-31: qty 2

## 2021-05-31 MED ORDER — MELATONIN 5 MG PO TABS
5.0000 mg | ORAL_TABLET | Freq: Every day | ORAL | Status: DC
  Administered 2021-05-31 – 2021-06-16 (×17): 5 mg
  Filled 2021-05-31 (×17): qty 1

## 2021-05-31 MED ORDER — FUROSEMIDE 10 MG/ML IJ SOLN
20.0000 mg | Freq: Two times a day (BID) | INTRAMUSCULAR | Status: AC
  Administered 2021-05-31 (×2): 20 mg via INTRAVENOUS
  Filled 2021-05-31 (×2): qty 2

## 2021-05-31 MED ORDER — OXYCODONE HCL 5 MG PO TABS
5.0000 mg | ORAL_TABLET | Freq: Four times a day (QID) | ORAL | Status: DC | PRN
  Administered 2021-05-31: 5 mg
  Administered 2021-06-01 – 2021-06-03 (×5): 10 mg
  Filled 2021-05-31 (×4): qty 2
  Filled 2021-05-31 (×2): qty 1
  Filled 2021-05-31: qty 2

## 2021-05-31 NOTE — Progress Notes (Signed)
Patient placed on CPAP/PS by Dr. Debbora Dus. Patient tolerating well at this time.

## 2021-05-31 NOTE — Progress Notes (Signed)
[place] for Bivalirudin Indication:  ECMO  Allergies  Allergen Reactions   Amoxicillin-Pot Clavulanate     Per pt report on 08/19/20, makes his urine dark colored   Atorvastatin Other (See Comments)     ( pt states causing runny nose, headaches, issue w/ urination)   Plant Derived Enzymes     Other reaction(s): Unknown   Trichophyton Other (See Comments)    Patient Measurements: Height: 5\' 10"  (177.8 cm) Weight: 84.4 kg (186 lb 1.1 oz) IBW/kg (Calculated) : 73  Vital Signs: Temp: 98.1 F (36.7 C) (02/12 0600) Temp Source: Other (Comment) (02/11 1940) BP: 137/61 (02/12 0700) Pulse Rate: 80 (02/12 0700)  Labs: Recent Labs    05/29/21 0424 05/29/21 1143 05/30/21 0436 05/30/21 1037 05/30/21 1618 05/30/21 1619 05/30/21 2012 05/31/21 0329 05/31/21 0330  HGB 9.3*   9.5*   < > 8.8*   < > 8.1*   < > 7.5* 8.2* 8.1*  HCT 30.4*   28.0*   < > 28.3*   < > 26.0*   < > 22.0* 24.0* 25.4*  PLT 212   < > 186  --  186  --   --   --  177  APTT 52*   < > 52*  --  58*  --   --   --  56*  LABPROT 22.5*  --  24.0*  --   --   --   --   --  25.1*  INR 2.0*  --  2.2*  --   --   --   --   --  2.3*  CREATININE 1.03   < > 1.14  --  1.16  --   --   --  1.17   < > = values in this interval not displayed.     Estimated Creatinine Clearance: 59.8 mL/min (by C-G formula based on SCr of 1.17 mg/dL).   Medical History: Past Medical History:  Diagnosis Date   Allergic rhinitis    Prostate cancer ([place])     Medications:  Infusions:   sodium chloride Stopped (05/26/21 0138)   albumin human Stopped (05/30/21 1139)   albumin human Stopped (05/30/21 0800)   bivalirudin (ANGIOMAX) infusion 0.5 mg/mL (Non-ACS indications) 0.068 mg/kg/hr (05/31/21 0700)   dexmedetomidine (PRECEDEX) IV infusion Stopped (05/27/21 0533)   feeding supplement (VITAL 1.5 CAL) 60 mL/hr at 05/31/21 0700    Assessment: 71 year old male with coronary artery disease who initially  presented with chest pain, noted to have multivessel coronary artery disease, he underwent CABG x3 on 05/17/2021, course was complicated with atrial fibrillation, frequent hiccups and aspiration pneumonia leading to hypoxia and increasing oxygen requirement. Patient now requiring VV ECMO. Pharmacy consulted for bivalirudin IV.   aPTT this morning is 56 seconds. Bivalirudin rate increased yesterday with some fibrin deposition. Pt now with oozing from cannula site and trach uncontrolled with thrombipad and surgicell. Will reduce rate very slightly.  Goal of Therapy:  aPTT 50-80 seconds Monitor platelets by anticoagulation protocol: Yes   Plan:  Reduce bivalirudin at 0.065 mg/kg/hr Monitor q12h CBC, closely for s/sx bleeding    Arrie Senate, PharmD, BCPS, [facility] Clinical Pharmacist [phone] Please check AMION for all [facility] Pharmacy numbers 05/31/2021

## 2021-05-31 NOTE — Progress Notes (Signed)
Patient ID: MAISON KESTENBAUM, male   DOB: 01-May-1950, 71 y.o.   MRN: [id number]   Progress Note  Patient Name: Randall Hodges Date of Encounter: 05/31/2021  [facility] Cardiologist: [name], MD   Subjective   2/2: VV ECMO initiation, Crescent catheter right IJ 2/7: Tracheostomy 2/13 CT C/A/P : Diffuse ground-glass opacities consistent with atelectasis and edema presumably related to ECMO physiology and respiratory failure.Underlying moderate interstitial lung disease mildly progressed from prior CT.  Remains on vent. Off precedex  Will wake up and follow commands.   Amio gtt stopped 2/11 by CCM. Remains in NSR  Continues with hiccups and vomiting overnight. Also had some bleeding from trach site stopped with Thrombi-pad  LDH 615 -> 380 ->-> -> 345 -> 301 -> 321  Vent FiO2 0.4. ABG ok   CVP 9-10   ECMO: Speed 2700 rpm Flow 3.4 L/min pVen -42 DeltaP 13 Sweep 5 PTT 52 (goal 50-80) LDH 301 ABG 7.42/49/62/91% SVO2 79%  Cardiac Studies: Echo (limited, 1/30): Echo reviewed, there is clear respirophasic variation of the interventricular septum and marked respirophasic variation of E inflow velocity on doppler evaluation of the mitral valve.  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  LV EF 45-50%.   RHC Procedural Findings (on norepinephrine 6): Hemodynamics (mmHg) RA mean 12 RV 37/12 PA 38/16, mean 27 PCWP mean 11 LV 108/12 AO 96/55 PAPI 1.8 Oxygen saturations: PA 54% AO 94% Cardiac Output (Fick) 5.59  Cardiac Index (Fick) 2.81  PVR 2.8 WU Simultaneous RV/LV tracings were obtained.  Difficult to interpret due to atrial fibrillation.  There was some suggestion of discordance (ventricular interdependence) but not clear.  Inpatient Medications    Scheduled Meds:   amLODipine  10 mg Per NG tube Daily   aspirin  81 mg Per Tube Daily   baclofen  5 mg Per Tube TID   carvedilol  12.5 mg Per Tube BID WC   chlorhexidine gluconate (MEDLINE KIT)  15 mL Mouth Rinse BID   Chlorhexidine Gluconate Cloth  6 each Topical Daily   colchicine  0.6 mg Per Tube Daily   feeding supplement (PROSource TF)  45 mL Per Tube TID   fiber  1 packet Per Tube BID   free water  150 mL Per Tube Q2H   furosemide  20 mg Intravenous BID   gabapentin  300 mg Per Tube QHS   guaiFENesin  30 mL Per Tube QID   hydrALAZINE  25 mg Per Tube TID   insulin aspart  0-15 Units Subcutaneous Q4H   mouth rinse  15 mL Mouth Rinse 10 times per day   methylPREDNISolone (SOLU-MEDROL) injection  40 mg Intravenous Q24H   Followed by   Derrill Memo ON 06/05/2021] methylPREDNISolone (SOLU-MEDROL) injection  20 mg Intravenous Q24H   pantoprazole sodium  40 mg Per Tube BID   rosuvastatin  10 mg Per Tube Daily   sodium chloride flush  10-40 mL Intracatheter Q12H   sodium chloride flush  10-40 mL Intracatheter Q12H   sodium chloride flush  3 mL Intravenous Q12H   Continuous Infusions:  sodium chloride Stopped (05/26/21 0138)   albumin human Stopped (05/30/21 1139)   albumin human Stopped (05/30/21 0800)   bivalirudin (ANGIOMAX) infusion 0.5 mg/mL (Non-ACS indications) 0.065 mg/kg/hr (05/31/21 0800)   dexmedetomidine (PRECEDEX) IV infusion Stopped (05/27/21 0533)   feeding supplement (VITAL 1.5 CAL)  60 mL/hr at 05/31/21 0700   PRN Meds: Place/Maintain arterial line **AND** sodium chloride, acetaminophen (TYLENOL) oral liquid 160 mg/5 mL, albumin human, albumin human, diazepam, docusate, fentaNYL (SUBLIMAZE) injection, levalbuterol, ondansetron (ZOFRAN) IV, oxyCODONE, polyvinyl alcohol, sennosides, sodium chloride flush   Vital Signs    Vitals:   05/31/21 0730 05/31/21 0800 05/31/21 0859 05/31/21 0900  BP: 137/61 (!) 134/51 (!) 134/51 (!) 99/53  Pulse: 81 83 74 72  Resp: (!) 25 18 (!) 34 15  Temp:  98.2  F (36.8 C)    TempSrc:  Core (Comment)    SpO2: 97% 97% 98% 97%  Weight:      Height:        Intake/Output Summary (Last 24 hours) at 05/31/2021 0953 Last data filed at 05/31/2021 0800 Gross per 24 hour  Intake 1704.43 ml  Output 2235 ml  Net -530.57 ml    Last 3 Weights 05/31/2021 05/30/2021 05/29/2021  Weight (lbs) 186 lb 1.1 oz 184 lb 1.4 oz 177 lb 7.5 oz  Weight (kg) 84.4 kg 83.5 kg 80.5 kg      Telemetry    NSR 70-80s (personally reviewed)  Physical Exam   General: sedated will arouse HEENT: normal + cortrak Neck: supple. + RIJ ecmo cannula + trach with dressing on it Cor: PMI nondisplaced. Regular rate & rhythm. No rubs, gallops or murmurs. Lungs: coarse Abdomen: soft, nontender, nondistended. No hepatosplenomegaly. No bruits or masses. Good bowel sounds. Extremities: no cyanosis, clubbing, rash, tr-1+ edema Neuro: sedated but will awaken   Labs    High Sensitivity Troponin:   Recent Labs  Lab 04/26/2021 1321 04/25/2021 1518  TROPONINIHS 3 4      Chemistry Recent Labs  Lab 05/25/21 0344 05/25/21 0528 05/26/21 0328 05/26/21 0932 05/27/21 0350 05/27/21 0546 05/29/21 0424 05/29/21 1143 05/30/21 0436 05/30/21 1037 05/30/21 1618 05/30/21 1619 05/30/21 2012 05/31/21 0329 05/31/21 0330  NA 147*   < > 150*   150*   < > 150*   < > 148*   148*   < > 152*   < > 152*   < > 153* 151* 149*  K 4.1   < > 4.3   4.2   < > 4.7   < > 4.3   4.5   < > 3.8   < > 4.0   < > 3.9 4.1 4.2  CL 105   < > 109   < > 114*   < > 109   < > 110  --  113*  --   --   --  111  CO2 35*   < > 35*   < > 31   < > 33*   < > 34*  --  33*  --   --   --  32  GLUCOSE 173*   < > 181*   < > 185*   < > 186*   < > 135*  --  135*  --   --   --  174*  BUN 58*   < > 56*   < > 55*   < > 62*   < > 77*  --  75*  --   --   --  78*  CREATININE 1.17   < > 1.10   < > 1.02   < > 1.03   < > 1.14  --  1.16  --   --   --  1.17  CALCIUM 7.8*   < > 7.8*   < >  7.9*   < > 7.9*   < > 8.1*  --  8.1*  --   --   --   8.0*  MG 2.1  --  2.2  --  2.3  --   --   --   --   --   --   --   --   --   --   PROT 4.4*  --  4.0*  --  4.2*   < > 4.4*  --  4.2*  --   --   --   --   --  4.2*  ALBUMIN 2.0*  --  1.9*  --  2.0*   < > 2.4*  --  2.4*  --   --   --   --   --  2.4*  AST 165*  --  196*  --  86*   < > 35  --  36  --   --   --   --   --  43*  ALT 274*  --  387*  --  306*   < > 140*  --  102*  --   --   --   --   --  83*  ALKPHOS 155*  --  189*  --  185*   < > 116  --  105  --   --   --   --   --  116  BILITOT 0.7  --  1.9*  --  1.3*   < > 0.7  --  0.9  --   --   --   --   --  1.2  GFRNONAA >60   < > >60   < > >60   < > >60   < > >60  --  >60  --   --   --  >60  ANIONGAP 7   < > 6   < > 5   < > 6   < > 8  --  6  --   --   --  6   < > = values in this interval not displayed.     Lipids  No results for input(s): CHOL, TRIG, HDL, LABVLDL, LDLCALC, CHOLHDL in the last 168 hours.   Hematology Recent Labs  Lab 05/30/21 0436 05/30/21 1037 05/30/21 1618 05/30/21 1619 05/30/21 2012 05/31/21 0329 05/31/21 0330  WBC 14.3*  --  14.2*  --   --   --  16.9*  RBC 3.05*  --  2.83*  --   --   --  2.75*  HGB 8.8*   < > 8.1*   < > 7.5* 8.2* 8.1*  HCT 28.3*   < > 26.0*   < > 22.0* 24.0* 25.4*  MCV 92.8  --  91.9  --   --   --  92.4  MCH 28.9  --  28.6  --   --   --  29.5  MCHC 31.1  --  31.2  --   --   --  31.9  RDW 16.9*  --  17.0*  --   --   --  17.2*  PLT 186  --  186  --   --   --  177   < > = values in this interval not displayed.    Thyroid No results for input(s): TSH, FREET4 in the last 168 hours.  BNPNo results for input(s): BNP, PROBNP in the last 168 hours.  DDimer No  results for input(s): DDIMER in the last 168 hours.   Radiology    DG CHEST PORT 1 VIEW  Result Date: 05/31/2021 CLINICAL DATA:  71 year old male with history of respiratory failure. EXAM: PORTABLE CHEST 1 VIEW COMPARISON:  Chest x-ray 05/30/2021. FINDINGS: A tracheostomy tube is in place with tip 4.7 cm above the carina. A feeding tube  is seen extending into the abdomen, however, the tip of the feeding tube extends below the lower margin of the image. ECMO cannula projecting over the right-side of the mediastinum. Status post median sternotomy for CABG. Widespread areas of interstitial prominence and patchy airspace disease again noted throughout the lungs bilaterally. Small bilateral pleural effusions. No pneumothorax. Heart size is mildly enlarged. Atherosclerotic calcifications in the thoracic aorta. IMPRESSION: 1. The appearance the chest is very similar to the prior examination, with diffuse interstitial and airspace disease in the lungs, which could reflect pulmonary edema and/or multilobar pneumonia. 2. Aortic atherosclerosis. 3. Support apparatus and postoperative changes, as above. Electronically Signed   By: Vinnie Langton M.D.   On: 05/31/2021 08:32   DG CHEST PORT 1 VIEW  Result Date: 05/30/2021 CLINICAL DATA:  Chest pain EXAM: PORTABLE CHEST 1 VIEW COMPARISON:  05/29/2021 FINDINGS: Stable cardiomediastinal contours. ECMO device is again noted. Tracheostomy tube tip is stable above the carina. There is a feeding tube which courses below the level of the GE junction. The tip is below the field of view. Right arm PICC line tip is at the level of the cavoatrial junction. Status post median sternotomy and CABG procedure. Diffuse bilateral interstitial opacities are unchanged compatible with pulmonary edema. IMPRESSION: 1. No change in aeration to the lungs compared with prior exam. 2. Stable support apparatus. Electronically Signed   By: Kerby Moors M.D.   On: 05/30/2021 16:43    Cardiac Studies   Cath 05/15/2021 Distal left main [place] 111 bifurcation stenosis with 75% left main, 90% ostial to proximal LAD, and 80-90% ostial circumflex (difficult to assess due to heavy calcification). Severe mid circumflex disease with 70% eccentric mid stenosis and second obtuse marginal containing ostial to proximal greater than 80%  stenosis.  (Bifurcation [place] 111 Severe calcification in left main and LAD in particular with diffuse 50% narrowing from proximal to mid vessel and tandem 70% stenoses in the mid LAD. Nondominant right coronary Normal LV function.  EF 55%.  LVEDP normal.    Patient Profile     71 y.o. male with PMH of PVCs presented with chest pain. Cardiac cath by Dr. Tamala Julian on 04/21/2021 showed 75% left main, 90% ost to prox LAD, 80-90% ost LCx, 70% mid LCx, 80% OM2, 50% prox to mid LAD, 70% mid LAD, EF 55%. Patient underwent CABG x 3 on 05/01/2021. Post op course complicated afib, treated with amio. CXR showed opacity in bilateral lung, started abx on 1/25 and diuretic. Started on Eliquis due to recurrence of afib.    Assessment & Plan    1. CAD: Admitted with unstable angina, cath with severe left main and proximal LAD/LCx disease (nondominant RCA).  CABG x 3 on 1/18 with LIMA-LAD, SVG-OM, SVG-left PDA.  No s/s angina  - Continue ASA 81, statin.  2. Acute HF with mid range EF:  Echo on 1/30 with EF 45-50%, clear respirophasic variation of the interventricular septum and marked respirophasic variation of E inflow velocity on doppler evaluation of the mitral valve; small to moderate pericardial effusion with pericardial thickening, concerning for effusive/constrictive pericarditis (not consistent with tamponade with more  organized pericardium but probably similar hemodynamics). RHC 1/30 with equalization of diastolic pressures.  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  Repeated echo 2/2 still showed respirophasic septal variation but not as impressive. CVP 8-9 today.  - Continue low-dose lasix 20 iV bid to try to keep even. Can increase as needed as tolerated from ECMO standpoint - With concern for development of post-surgical effusive/constrictive pericarditis, he is on colchicine.  - ?need for surgical intervention on effusive/constrictive pericarditis. Will need improvement  of lung disease first then reassess, repeat echo not as impressive.   3. 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  Suspect primarily septic/vasodilatory shock. He is currently off vasopressin and NE with MAP now high. Co-ox 85% 4. Hiccups: Still present.  - On baclofen. Gabapentin started 5. PNA with acute hypoxic respiratory failure: Of note, he does appear to have had some pre-existing ILD from 2019 CT chest (?sarcoidosis). CXR with persistent bilateral infiltrates, possible mild improvement compared to yesterday.  Have thought most likely aspiration PNA/pneumonitis in setting of intractable hiccups. He has developred ARDS.  Afebrile. COVID was negative.  FiO2 0.5 on vent.  We have struggled to wean sweep but down to 3 today. CXR similar with bilateral diffuse airspace disease. Now with tracheostomy. CT 2/9 with persistent lung infiltrates - Sweep back up to 5. ABG ok  - Discussed with pulmonary, think amiodarone toxicity unlikely as he has only been on it post-op. However they have asked to hold for now. Will watch closely  - Completed vancomycin and meropenem.   - Vent and sedation per CCM.  - Solumedrol 40 mg IV bid, ESR now normal.  6. Atypical atrial flutter/PVCs:  Remains in NSR - Stopping amio per ccm. May need to restart - Bivalirudin gtt, goal PTT 50-80. Discussed dosing with PharmD personally. 7. AKI: Creatinine improved to 1.17.  Follow closely.  8. Elevated LFTs: Suspect shock liver, follow CMET.  LFTs trending down.  9. Anemia:hgb at 9.3 -> 8.8 -> transfuse to keep > 7.5 10. Hypernatremia: Continue free water 300 cc q4 hrs.  NA 151-> 149 today 11. HTN: Now on amlodipine and hydralazine.  Whip in arterial line, going by cuff.  - Continue Coreg 12.5 bid  CRITICAL CARE Performed by: Glori Bickers  Total critical care time: 40 minutes  Critical care time was exclusive of separately billable procedures and treating other  patients.  Critical care was necessary to treat or prevent imminent or life-threatening deterioration.  Critical care was time spent personally by me on the following activities: development of treatment plan with patient and/or surrogate as well as nursing, discussions with consultants, evaluation of patient's response to treatment, examination of patient, obtaining history from patient or surrogate, ordering and performing treatments and interventions, ordering and review of laboratory studies, ordering and review of radiographic studies, pulse oximetry and re-evaluation of patient's condition.  Glori Bickers MD 05/31/2021 9:53 AM

## 2021-05-31 NOTE — Progress Notes (Signed)
Patient placed back on full vent support due to increased WOB.

## 2021-05-31 NOTE — Progress Notes (Signed)
ANTICOAGULATION CONSULT NOTE  Pharmacy Consult for Bivalirudin Indication:  ECMO  Allergies  Allergen Reactions   Amoxicillin-Pot Clavulanate     Per pt report on 08/19/20, makes his urine dark colored   Atorvastatin Other (See Comments)     ( pt states causing runny nose, headaches, issue w/ urination)   Plant Derived Enzymes     Other reaction(s): Unknown   Trichophyton Other (See Comments)    Patient Measurements: Height: 5\' 10"  (177.8 cm) Weight: 84.4 kg (186 lb 1.1 oz) IBW/kg (Calculated) : 73  Vital Signs: Temp: 98.6 F (37 C) (02/12 1600) Temp Source: Core (02/12 1600) BP: 121/45 (02/12 1800) Pulse Rate: 77 (02/12 1800)  Labs: Recent Labs    05/29/21 0424 05/29/21 1143 05/30/21 0436 05/30/21 1037 05/30/21 1618 05/30/21 1619 05/31/21 0330 05/31/21 1015 05/31/21 1600 05/31/21 1604  HGB 9.3*   9.5*   < > 8.8*   < > 8.1*   < > 8.1* 7.1* 7.5* 7.8*  HCT 30.4*   28.0*   < > 28.3*   < > 26.0*   < > 25.4* 21.0* 22.0* 24.8*  PLT 212   < > 186  --  186  --  177  --   --  178  APTT 52*   < > 52*  --  58*  --  56*  --   --  59*  LABPROT 22.5*  --  24.0*  --   --   --  25.1*  --   --   --   INR 2.0*  --  2.2*  --   --   --  2.3*  --   --   --   CREATININE 1.03   < > 1.14  --  1.16  --  1.17  --   --  1.19   < > = values in this interval not displayed.     Estimated Creatinine Clearance: 58.8 mL/min (by C-G formula based on SCr of 1.19 mg/dL).   Medical History: Past Medical History:  Diagnosis Date   Allergic rhinitis    Prostate cancer ([place])     Medications:  Infusions:   sodium chloride Stopped (05/26/21 0138)   albumin human Stopped (05/31/21 1720)   albumin human Stopped (05/30/21 0800)   bivalirudin (ANGIOMAX) infusion 0.5 mg/mL (Non-ACS indications) 0.065 mg/kg/hr (05/31/21 1800)   dexmedetomidine (PRECEDEX) IV infusion Stopped (05/27/21 0533)   feeding supplement (VITAL 1.5 CAL) 1,000 mL (05/31/21 1019)    Assessment: 71 year old male with  coronary artery disease who initially presented with chest pain, noted to have multivessel coronary artery disease, he underwent CABG x3 on 05/13/2021, course was complicated with atrial fibrillation, frequent hiccups and aspiration pneumonia leading to hypoxia and increasing oxygen requirement. Patient now requiring VV ECMO. Pharmacy consulted for bivalirudin IV.   aPTT 59 seconds tonight on Bivalirudin rate 0.065mg /kg/hr.  Pt now with slight oozing from cannula site and trach but controlled with thrombipad and surgicell. H/h remains stable   Goal of Therapy:  aPTT 50-80 seconds Monitor platelets by anticoagulation protocol: Yes   Plan:  Continue bivalirudin at 0.065 mg/kg/hr Monitor q12h CBC, closely for s/sx bleeding    Bonnita Nasuti Pharm.D. CPP, BCPS Clinical Pharmacist [phone] 05/31/2021 7:16 PM   Please check AMION for all [place] numbers 05/31/2021

## 2021-05-31 NOTE — Progress Notes (Signed)
NAME:  Randall Hodges, MRN:  [id number], DOB:  [birth year], LOS: 13 ADMISSION DATE:  05/13/2021, CONSULTATION DATE: 05/19/2021 REFERRING MD:  Dahlia Byes, MD, CHIEF COMPLAINT: Increasing shortness of breath  History of Present Illness:  71 year old male with coronary artery disease who initially presented with chest pain, noted to have multivessel coronary artery disease, he underwent CABG x3 on 04/1818 23, course was complicated with atrial fibrillation, frequent hiccups and aspiration pneumonia leading to hypoxia and increasing oxygen requirement. PCCM was consulted for evaluation and help with management  Patient stated hiccups are better but continued complain of shortness of breath, cough unable to bring up phlegm.  Pertinent  Medical History   Past Medical History:  Diagnosis Date   Allergic rhinitis    Prostate cancer ([place])     Significant Hospital Events: Including procedures, antibiotic start and stop dates in addition to other pertinent events   1/18 CABG 1/30 PCCM consult, Worsening O2 needs, likely aspiration, new pressor need 2/2 intubation, placed on VV ECMO 2/5 issues with recirc, bronch neg, improved with supine positioning 2/7 percutaneous tracheostomy 2/10 diuresis and weaning sedation. CT chest shows diffuse ground-glass opacification.   Interim History / Subjective:   Continues to have episodes of hiccups and nausea with vomiting at night. Tolerated 20 mg of furosemide without chatter.   Some bleeding from cannula and trach site - stopped with thrombi-pad.   Objective   Blood pressure (!) 134/51, pulse 74, temperature 98.1 F (36.7 C), resp. rate (!) 34, height 5\' 10"  (1.778 m), weight 84.4 kg, SpO2 98 %. CVP:  [6 mmHg-13 mmHg] 6 mmHg  Vent Mode: PCV FiO2 (%):  [40 %] 40 % Set Rate:  [22 bmp] 22 bmp PEEP:  [10 cmH20] 10 cmH20 Plateau Pressure:  [20 cmH20-29 cmH20] 20 cmH20   Intake/Output Summary (Last 24 hours) at 05/31/2021 0909 Last data filed at  05/31/2021 0800 Gross per 24 hour  Intake 1704.43 ml  Output 2235 ml  Net -530.57 ml    Filed Weights   05/29/21 0600 05/30/21 0600 05/31/21 0600  Weight: 80.5 kg 83.5 kg 84.4 kg     Examination: General: average build appears acutely ill. On no sedative or pressor infusions.  HENT: tracheostomy and Cortrak in place without pressure injury. Cannula site intact. Minimal crusted blood.  Lungs: scattered crackles. On PCV with Pplat 24 driving pressure 15. 0.4/10. Tolerating PSV 20/5 with similar tidal volumes.  VV  ECMO at 2550 rpm with sweep 4 Cardiovascular: HS normal  Abdomen: soft, still Flexiseal in place Extremities: edema +2 Neuro: still sedated with limited response to pain.  GU: Foley in place with abundant clear urine  Ancillary tests personally reviewed:   7.42/48/76 Na: 149 CO2 32  LDH 321 WBC 16.9 HB: 8.1 INR: 2.3  Assessment & Plan:  CAD with unstable angina s/p CABGx3 1/18 (LIMA-LAD, SVG-OM, SVG-left PDA) Presumed aspiration pneumonitis vs. HCAP vs. ?AEILD culminating in severe ARDS with inability to ventilate along with high Aa gradient s/p VV ECMO cannulation 05/21/22.  CT shows predominant alveolitis - should resolve completely with time.  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   Plan:   - Continue current sweep and anticipate longer run - Continue to minimize sedation and begin mobilizing.  - Continue BID baclofen for hiccups to prevent aspiration - Taper steroids.  - Albumin and diurese w/ lasix  as allowed by ECMO circuit.  Tolerates 20mg  bid of furosemide. - Increase carvedilol for BP and HR control to limit amiodarone exposure.  - PT/OT w/ reduced sedation to promote activity    Best Practice (right click and "Reselect all SmartList Selections" daily)   Diet/type: Vital @ 106mL/hr DVT  prophylaxis: Bival CBG: yes, SSI GI prophylaxis: PPI Lines: L [place] TLC, RIJ  Foley:  Yes, keep Code Status:  full code Last date of multidisciplinary goals of care discussion [daily at bedside]  CRITICAL CARE Performed by: Kipp Brood   Total critical care time: 40 minutes  Critical care time was exclusive of separately billable procedures and treating other patients.  Critical care was necessary to treat or prevent imminent or life-threatening deterioration.  Critical care was time spent personally by me on the following activities: development of treatment plan with patient and/or surrogate as well as nursing, discussions with consultants, evaluation of patient's response to treatment, examination of patient, obtaining history from patient or surrogate, ordering and performing treatments and interventions, ordering and review of laboratory studies, ordering and review of radiographic studies, pulse oximetry, re-evaluation of patient's condition and participation in multidisciplinary rounds.  Kipp Brood, MD [facility] ICU Physician [place]  Pager: [phone] Mobile: [phone] After hours: [phone].

## 2021-05-31 NOTE — Progress Notes (Signed)
Patient ID: KEYSTON ARDOLINO, male   DOB: 07-Jan-1951, 71 y.o.   MRN: [id number] Extracorporeal support note   ECLS support day: 9 Indication: ARDS  Configuration: VV  Drainage cannula: Crescent R IJ Return cannula: Crescent R IJ  Pump speed: 2700 rpm Pump flow: 3.4 L/min Pump used: Cardiohelp  Sweep gas: 5  ABG ok   Circuit check: Small amount thrombus. Good color change. LDH stable @ 321.  Anticoagulant: Bivalirudin, PTT goal 50-80. PTT 56  Changes in support: Vent down to 0.40. Sweep up to 5.   Anticipated goals/duration of support: Wean to discontinuation  Glori Bickers, MD  10:34 AM

## 2021-05-31 NOTE — Progress Notes (Signed)
Assisted tele visit to patient with family member.  Maryellen Dowdle M, RN  

## 2021-05-31 NOTE — Progress Notes (Signed)
0700 - Report received from Antony Haste, Therapist, sports.  All questions answered.  Safety checks performed.  All lines and drips verified. Hand hygiene performed before/after each pt contact. Antony Haste, RN reported that pt oozed more from ECMO cannula and around trach.  Legrand Como, PharmD notified and order for Bival decreased.  Bival updated on pump. 0800 - Assessment, Rx.  Shanon Brow, pt's son arrived.  Jazz music playing. 65 - Dr. Lynetta Mare rounded and updated Shanon Brow.  All questions answered.  Pt placed on Pressure Support on vent.  Discussed possible hypoactive delirium and pt's days/nights seem flipped.  Pt's fluid boluses discussed and changed from 300 mL q4 h to 150 mL q2 h. 0930 - Pt placed in chair position.  David left.  Kangaroo pump updated with H2O flush order updated. 1000 - Dr. Haroldine Laws rounded. 1100 - Dr. Lynetta Mare rounded. 1130 - Pt w/increased work of breathing.  Vent settings changed to improve pt's respiratory status. 1145 - ECMO chugging.  Pt repositioned to semi-Fowler's at ~ 50 degrees.  Chugging stopped. 1150 - Pt placed back on full support on vent. 1230 - Pt's WOB still increased.  Tylenol and oxycodone given.  WOB decreased. 1500 - Pt bathed.   [place] returned to visit. 1645 - Discussed e-link/video with Shanon Brow.  He advised that he would go home and work through it with Pam. 1700 - Slight chugging noted to ECMO circuit.  25% albumin given. [place] called and wanted to set up a video chat.  E-link called and given Pam's number. [place] - E-link call initiated. 1800 - E-link call ended. 1810 - Dr. Lynetta Mare sent a secure chat d/t pt's K+ level result of 3.8 mmol/L.  Order placed for 40 mEq of K+.   1812 - Pharmacy called.  New Bival bag is ready.  He advised that he could deliver it in the next 20 minutes or so. 1900 - Report given to Lysbeth Galas, Therapist, sports.  All questions answered.

## 2021-06-01 ENCOUNTER — Ambulatory Visit: Payer: PPO | Admitting: Cardiothoracic Surgery

## 2021-06-01 ENCOUNTER — Inpatient Hospital Stay (HOSPITAL_COMMUNITY): Payer: PPO

## 2021-06-01 DIAGNOSIS — J9601 Acute respiratory failure with hypoxia: Secondary | ICD-10-CM | POA: Diagnosis not present

## 2021-06-01 DIAGNOSIS — I213 ST elevation (STEMI) myocardial infarction of unspecified site: Secondary | ICD-10-CM | POA: Diagnosis not present

## 2021-06-01 LAB — APTT
aPTT: 59 seconds — ABNORMAL HIGH (ref 24–36)
aPTT: 53 seconds — ABNORMAL HIGH (ref 24–36)

## 2021-06-01 LAB — POCT I-STAT 7, (LYTES, BLD GAS, ICA,H+H)
Acid-Base Excess: 7 mmol/L — ABNORMAL HIGH (ref 0.0–2.0)
Acid-Base Excess: 6 mmol/L — ABNORMAL HIGH (ref 0.0–2.0)
Acid-Base Excess: 7 mmol/L — ABNORMAL HIGH (ref 0.0–2.0)
Acid-Base Excess: 11 mmol/L — ABNORMAL HIGH (ref 0.0–2.0)
Acid-Base Excess: 10 mmol/L — ABNORMAL HIGH (ref 0.0–2.0)
Acid-Base Excess: 12 mmol/L — ABNORMAL HIGH (ref 0.0–2.0)
Acid-Base Excess: 8 mmol/L — ABNORMAL HIGH (ref 0.0–2.0)
Acid-Base Excess: 5 mmol/L — ABNORMAL HIGH (ref 0.0–2.0)
Acid-Base Excess: 8 mmol/L — ABNORMAL HIGH (ref 0.0–2.0)
Acid-Base Excess: 6 mmol/L — ABNORMAL HIGH (ref 0.0–2.0)
Acid-Base Excess: 5 mmol/L — ABNORMAL HIGH (ref 0.0–2.0)
Acid-base deficit: 5 mmol/L — ABNORMAL HIGH (ref 0.0–2.0)
Bicarbonate: 31.7 mmol/L — ABNORMAL HIGH (ref 20.0–28.0)
Bicarbonate: 30.8 mmol/L — ABNORMAL HIGH (ref 20.0–28.0)
Bicarbonate: 31.8 mmol/L — ABNORMAL HIGH (ref 20.0–28.0)
Bicarbonate: 19.1 mmol/L — ABNORMAL LOW (ref 20.0–28.0)
Bicarbonate: 34.3 mmol/L — ABNORMAL HIGH (ref 20.0–28.0)
Bicarbonate: 33.8 mmol/L — ABNORMAL HIGH (ref 20.0–28.0)
Bicarbonate: 32.1 mmol/L — ABNORMAL HIGH (ref 20.0–28.0)
Bicarbonate: 33.7 mmol/L — ABNORMAL HIGH (ref 20.0–28.0)
Bicarbonate: 28.5 mmol/L — ABNORMAL HIGH (ref 20.0–28.0)
Bicarbonate: 31.5 mmol/L — ABNORMAL HIGH (ref 20.0–28.0)
Bicarbonate: 29.5 mmol/L — ABNORMAL HIGH (ref 20.0–28.0)
Bicarbonate: 29 mmol/L — ABNORMAL HIGH (ref 20.0–28.0)
Calcium, Ion: 1.19 mmol/L (ref 1.15–1.40)
Calcium, Ion: 1.21 mmol/L (ref 1.15–1.40)
Calcium, Ion: 1.21 mmol/L (ref 1.15–1.40)
Calcium, Ion: 0.93 mmol/L — ABNORMAL LOW (ref 1.15–1.40)
Calcium, Ion: 1.15 mmol/L (ref 1.15–1.40)
Calcium, Ion: 1.13 mmol/L — ABNORMAL LOW (ref 1.15–1.40)
Calcium, Ion: 1.11 mmol/L — ABNORMAL LOW (ref 1.15–1.40)
Calcium, Ion: 1.17 mmol/L (ref 1.15–1.40)
Calcium, Ion: 1.15 mmol/L (ref 1.15–1.40)
Calcium, Ion: 1.18 mmol/L (ref 1.15–1.40)
Calcium, Ion: 1.16 mmol/L (ref 1.15–1.40)
Calcium, Ion: 1.17 mmol/L (ref 1.15–1.40)
HCT: 22 % — ABNORMAL LOW (ref 39.0–52.0)
HCT: 22 % — ABNORMAL LOW (ref 39.0–52.0)
HCT: 24 % — ABNORMAL LOW (ref 39.0–52.0)
HCT: 16 % — ABNORMAL LOW (ref 39.0–52.0)
HCT: 26 % — ABNORMAL LOW (ref 39.0–52.0)
HCT: 27 % — ABNORMAL LOW (ref 39.0–52.0)
HCT: 26 % — ABNORMAL LOW (ref 39.0–52.0)
HCT: 27 % — ABNORMAL LOW (ref 39.0–52.0)
HCT: 29 % — ABNORMAL LOW (ref 39.0–52.0)
HCT: 30 % — ABNORMAL LOW (ref 39.0–52.0)
HCT: 27 % — ABNORMAL LOW (ref 39.0–52.0)
HCT: 26 % — ABNORMAL LOW (ref 39.0–52.0)
Hemoglobin: 7.5 g/dL — ABNORMAL LOW (ref 13.0–17.0)
Hemoglobin: 7.5 g/dL — ABNORMAL LOW (ref 13.0–17.0)
Hemoglobin: 8.2 g/dL — ABNORMAL LOW (ref 13.0–17.0)
Hemoglobin: 5.4 g/dL — CL (ref 13.0–17.0)
Hemoglobin: 8.8 g/dL — ABNORMAL LOW (ref 13.0–17.0)
Hemoglobin: 9.2 g/dL — ABNORMAL LOW (ref 13.0–17.0)
Hemoglobin: 8.8 g/dL — ABNORMAL LOW (ref 13.0–17.0)
Hemoglobin: 9.2 g/dL — ABNORMAL LOW (ref 13.0–17.0)
Hemoglobin: 10.2 g/dL — ABNORMAL LOW (ref 13.0–17.0)
Hemoglobin: 9.9 g/dL — ABNORMAL LOW (ref 13.0–17.0)
Hemoglobin: 9.2 g/dL — ABNORMAL LOW (ref 13.0–17.0)
Hemoglobin: 8.8 g/dL — ABNORMAL LOW (ref 13.0–17.0)
O2 Saturation: 89 %
O2 Saturation: 91 %
O2 Saturation: 92 %
O2 Saturation: 100 %
O2 Saturation: 88 %
O2 Saturation: 100 %
O2 Saturation: 100 %
O2 Saturation: 98 %
O2 Saturation: 100 %
O2 Saturation: 89 %
O2 Saturation: 94 %
O2 Saturation: 96 %
Patient temperature: 36.8
Patient temperature: 36.8
Patient temperature: 37
Patient temperature: 36.7
Patient temperature: 98.2
Patient temperature: 36.7
Patient temperature: 36.7
Patient temperature: 36.7
Patient temperature: 36.7
Patient temperature: 36.7
Patient temperature: 36.7
Patient temperature: 36.5
Potassium: 4.6 mmol/L (ref 3.5–5.1)
Potassium: 4.5 mmol/L (ref 3.5–5.1)
Potassium: 4.3 mmol/L (ref 3.5–5.1)
Potassium: 2.7 mmol/L — CL (ref 3.5–5.1)
Potassium: 4 mmol/L (ref 3.5–5.1)
Potassium: 3.9 mmol/L (ref 3.5–5.1)
Potassium: 3.9 mmol/L (ref 3.5–5.1)
Potassium: 4 mmol/L (ref 3.5–5.1)
Potassium: 3.8 mmol/L (ref 3.5–5.1)
Potassium: 4 mmol/L (ref 3.5–5.1)
Potassium: 4.1 mmol/L (ref 3.5–5.1)
Potassium: 4.2 mmol/L (ref 3.5–5.1)
Sodium: 151 mmol/L — ABNORMAL HIGH (ref 135–145)
Sodium: 152 mmol/L — ABNORMAL HIGH (ref 135–145)
Sodium: 152 mmol/L — ABNORMAL HIGH (ref 135–145)
Sodium: 152 mmol/L — ABNORMAL HIGH (ref 135–145)
Sodium: 156 mmol/L — ABNORMAL HIGH (ref 135–145)
Sodium: 151 mmol/L — ABNORMAL HIGH (ref 135–145)
Sodium: 152 mmol/L — ABNORMAL HIGH (ref 135–145)
Sodium: 152 mmol/L — ABNORMAL HIGH (ref 135–145)
Sodium: 150 mmol/L — ABNORMAL HIGH (ref 135–145)
Sodium: 151 mmol/L — ABNORMAL HIGH (ref 135–145)
Sodium: 153 mmol/L — ABNORMAL HIGH (ref 135–145)
Sodium: 153 mmol/L — ABNORMAL HIGH (ref 135–145)
TCO2: 33 mmol/L — ABNORMAL HIGH (ref 22–32)
TCO2: 32 mmol/L (ref 22–32)
TCO2: 33 mmol/L — ABNORMAL HIGH (ref 22–32)
TCO2: 20 mmol/L — ABNORMAL LOW (ref 22–32)
TCO2: 36 mmol/L — ABNORMAL HIGH (ref 22–32)
TCO2: 35 mmol/L — ABNORMAL HIGH (ref 22–32)
TCO2: 33 mmol/L — ABNORMAL HIGH (ref 22–32)
TCO2: 35 mmol/L — ABNORMAL HIGH (ref 22–32)
TCO2: 30 mmol/L (ref 22–32)
TCO2: 33 mmol/L — ABNORMAL HIGH (ref 22–32)
TCO2: 31 mmol/L (ref 22–32)
TCO2: 30 mmol/L (ref 22–32)
pCO2 arterial: 45 mmHg (ref 32.0–48.0)
pCO2 arterial: 45.6 mmHg (ref 32.0–48.0)
pCO2 arterial: 43.6 mmHg (ref 32.0–48.0)
pCO2 arterial: 27.5 mmHg — ABNORMAL LOW (ref 32.0–48.0)
pCO2 arterial: 40.8 mmHg (ref 32.0–48.0)
pCO2 arterial: 39.6 mmHg (ref 32.0–48.0)
pCO2 arterial: 30.8 mmHg — ABNORMAL LOW (ref 32.0–48.0)
pCO2 arterial: 40.2 mmHg (ref 32.0–48.0)
pCO2 arterial: 36.2 mmHg (ref 32.0–48.0)
pCO2 arterial: 36.4 mmHg (ref 32.0–48.0)
pCO2 arterial: 36 mmHg (ref 32.0–48.0)
pCO2 arterial: 39.9 mmHg (ref 32.0–48.0)
pH, Arterial: 7.455 — ABNORMAL HIGH (ref 7.350–7.450)
pH, Arterial: 7.437 (ref 7.350–7.450)
pH, Arterial: 7.471 — ABNORMAL HIGH (ref 7.350–7.450)
pH, Arterial: 7.449 (ref 7.350–7.450)
pH, Arterial: 7.532 — ABNORMAL HIGH (ref 7.350–7.450)
pH, Arterial: 7.538 — ABNORMAL HIGH (ref 7.350–7.450)
pH, Arterial: 7.51 — ABNORMAL HIGH (ref 7.350–7.450)
pH, Arterial: 7.645 (ref 7.350–7.450)
pH, Arterial: 7.5 — ABNORMAL HIGH (ref 7.350–7.450)
pH, Arterial: 7.546 — ABNORMAL HIGH (ref 7.350–7.450)
pH, Arterial: 7.52 — ABNORMAL HIGH (ref 7.350–7.450)
pH, Arterial: 7.467 — ABNORMAL HIGH (ref 7.350–7.450)
pO2, Arterial: 54 mmHg — ABNORMAL LOW (ref 83.0–108.0)
pO2, Arterial: 59 mmHg — ABNORMAL LOW (ref 83.0–108.0)
pO2, Arterial: 60 mmHg — ABNORMAL LOW (ref 83.0–108.0)
pO2, Arterial: 50 mmHg — ABNORMAL LOW (ref 83.0–108.0)
pO2, Arterial: 562 mmHg — ABNORMAL HIGH (ref 83.0–108.0)
pO2, Arterial: 527 mmHg — ABNORMAL HIGH (ref 83.0–108.0)
pO2, Arterial: 566 mmHg — ABNORMAL HIGH (ref 83.0–108.0)
pO2, Arterial: 92 mmHg (ref 83.0–108.0)
pO2, Arterial: 51 mmHg — ABNORMAL LOW (ref 83.0–108.0)
pO2, Arterial: 524 mmHg — ABNORMAL HIGH (ref 83.0–108.0)
pO2, Arterial: 62 mmHg — ABNORMAL LOW (ref 83.0–108.0)
pO2, Arterial: 77 mmHg — ABNORMAL LOW (ref 83.0–108.0)

## 2021-06-01 LAB — POCT I-STAT EG7
Acid-Base Excess: 6 mmol/L — ABNORMAL HIGH (ref 0.0–2.0)
Acid-Base Excess: 5 mmol/L — ABNORMAL HIGH (ref 0.0–2.0)
Acid-Base Excess: 4 mmol/L — ABNORMAL HIGH (ref 0.0–2.0)
Acid-Base Excess: 5 mmol/L — ABNORMAL HIGH (ref 0.0–2.0)
Bicarbonate: 30.1 mmol/L — ABNORMAL HIGH (ref 20.0–28.0)
Bicarbonate: 28.5 mmol/L — ABNORMAL HIGH (ref 20.0–28.0)
Bicarbonate: 26.3 mmol/L (ref 20.0–28.0)
Bicarbonate: 29 mmol/L — ABNORMAL HIGH (ref 20.0–28.0)
Calcium, Ion: 1.19 mmol/L (ref 1.15–1.40)
Calcium, Ion: 1.15 mmol/L (ref 1.15–1.40)
Calcium, Ion: 1.1 mmol/L — ABNORMAL LOW (ref 1.15–1.40)
Calcium, Ion: 1.17 mmol/L (ref 1.15–1.40)
HCT: 24 % — ABNORMAL LOW (ref 39.0–52.0)
HCT: 26 % — ABNORMAL LOW (ref 39.0–52.0)
HCT: 26 % — ABNORMAL LOW (ref 39.0–52.0)
HCT: 29 % — ABNORMAL LOW (ref 39.0–52.0)
Hemoglobin: 8.2 g/dL — ABNORMAL LOW (ref 13.0–17.0)
Hemoglobin: 8.8 g/dL — ABNORMAL LOW (ref 13.0–17.0)
Hemoglobin: 8.8 g/dL — ABNORMAL LOW (ref 13.0–17.0)
Hemoglobin: 9.9 g/dL — ABNORMAL LOW (ref 13.0–17.0)
O2 Saturation: 71 %
O2 Saturation: 91 %
O2 Saturation: 95 %
O2 Saturation: 76 %
Patient temperature: 36.5
Patient temperature: 36.7
Patient temperature: 36.6
Patient temperature: 36.7
Potassium: 3.9 mmol/L (ref 3.5–5.1)
Potassium: 3.8 mmol/L (ref 3.5–5.1)
Potassium: 3.6 mmol/L (ref 3.5–5.1)
Potassium: 3.9 mmol/L (ref 3.5–5.1)
Sodium: 150 mmol/L — ABNORMAL HIGH (ref 135–145)
Sodium: 151 mmol/L — ABNORMAL HIGH (ref 135–145)
Sodium: 153 mmol/L — ABNORMAL HIGH (ref 135–145)
Sodium: 150 mmol/L — ABNORMAL HIGH (ref 135–145)
TCO2: 31 mmol/L (ref 22–32)
TCO2: 30 mmol/L (ref 22–32)
TCO2: 27 mmol/L (ref 22–32)
TCO2: 30 mmol/L (ref 22–32)
pCO2, Ven: 42.1 mmHg — ABNORMAL LOW (ref 44.0–60.0)
pCO2, Ven: 38.5 mmHg — ABNORMAL LOW (ref 44.0–60.0)
pCO2, Ven: 30.2 mmHg — ABNORMAL LOW (ref 44.0–60.0)
pCO2, Ven: 38.6 mmHg — ABNORMAL LOW (ref 44.0–60.0)
pH, Ven: 7.46 — ABNORMAL HIGH (ref 7.250–7.430)
pH, Ven: 7.476 — ABNORMAL HIGH (ref 7.250–7.430)
pH, Ven: 7.546 — ABNORMAL HIGH (ref 7.250–7.430)
pH, Ven: 7.483 — ABNORMAL HIGH (ref 7.250–7.430)
pO2, Ven: 35 mmHg (ref 32.0–45.0)
pO2, Ven: 56 mmHg — ABNORMAL HIGH (ref 32.0–45.0)
pO2, Ven: 62 mmHg — ABNORMAL HIGH (ref 32.0–45.0)
pO2, Ven: 37 mmHg (ref 32.0–45.0)

## 2021-06-01 LAB — HEPATIC FUNCTION PANEL
ALT: 179 U/L — ABNORMAL HIGH (ref 0–44)
AST: 233 U/L — ABNORMAL HIGH (ref 15–41)
Albumin: 2.4 g/dL — ABNORMAL LOW (ref 3.5–5.0)
Alkaline Phosphatase: 177 U/L — ABNORMAL HIGH (ref 38–126)
Bilirubin, Direct: 0.8 mg/dL — ABNORMAL HIGH (ref 0.0–0.2)
Indirect Bilirubin: 0.8 mg/dL (ref 0.3–0.9)
Total Bilirubin: 1.6 mg/dL — ABNORMAL HIGH (ref 0.3–1.2)
Total Protein: 4.2 g/dL — ABNORMAL LOW (ref 6.5–8.1)

## 2021-06-01 LAB — CBC
HCT: 23.8 % — ABNORMAL LOW (ref 39.0–52.0)
HCT: 29.9 % — ABNORMAL LOW (ref 39.0–52.0)
Hemoglobin: 7.2 g/dL — ABNORMAL LOW (ref 13.0–17.0)
Hemoglobin: 9.4 g/dL — ABNORMAL LOW (ref 13.0–17.0)
MCH: 28.6 pg (ref 26.0–34.0)
MCH: 28.5 pg (ref 26.0–34.0)
MCHC: 30.3 g/dL (ref 30.0–36.0)
MCHC: 31.4 g/dL (ref 30.0–36.0)
MCV: 94.4 fL (ref 80.0–100.0)
MCV: 90.6 fL (ref 80.0–100.0)
Platelets: 182 10*3/uL (ref 150–400)
Platelets: 171 10*3/uL (ref 150–400)
RBC: 2.52 MIL/uL — ABNORMAL LOW (ref 4.22–5.81)
RBC: 3.3 MIL/uL — ABNORMAL LOW (ref 4.22–5.81)
RDW: 17.5 % — ABNORMAL HIGH (ref 11.5–15.5)
RDW: 16.7 % — ABNORMAL HIGH (ref 11.5–15.5)
WBC: 15.1 10*3/uL — ABNORMAL HIGH (ref 4.0–10.5)
WBC: 14.4 10*3/uL — ABNORMAL HIGH (ref 4.0–10.5)
nRBC: 0 % (ref 0.0–0.2)
nRBC: 0.1 % (ref 0.0–0.2)

## 2021-06-01 LAB — BASIC METABOLIC PANEL
Anion gap: 7 (ref 5–15)
Anion gap: 9 (ref 5–15)
Anion gap: 11 (ref 5–15)
BUN: 87 mg/dL — ABNORMAL HIGH (ref 8–23)
BUN: 84 mg/dL — ABNORMAL HIGH (ref 8–23)
BUN: 85 mg/dL — ABNORMAL HIGH (ref 8–23)
CO2: 30 mmol/L (ref 22–32)
CO2: 28 mmol/L (ref 22–32)
CO2: 24 mmol/L (ref 22–32)
Calcium: 8 mg/dL — ABNORMAL LOW (ref 8.9–10.3)
Calcium: 7.9 mg/dL — ABNORMAL LOW (ref 8.9–10.3)
Calcium: 7.8 mg/dL — ABNORMAL LOW (ref 8.9–10.3)
Chloride: 113 mmol/L — ABNORMAL HIGH (ref 98–111)
Chloride: 114 mmol/L — ABNORMAL HIGH (ref 98–111)
Chloride: 116 mmol/L — ABNORMAL HIGH (ref 98–111)
Creatinine, Ser: 1.29 mg/dL — ABNORMAL HIGH (ref 0.61–1.24)
Creatinine, Ser: 1.28 mg/dL — ABNORMAL HIGH (ref 0.61–1.24)
Creatinine, Ser: 1.2 mg/dL (ref 0.61–1.24)
GFR, Estimated: 59 mL/min — ABNORMAL LOW (ref >60)
GFR, Estimated: 60 mL/min — ABNORMAL LOW (ref >60)
GFR, Estimated: >60 mL/min (ref >60)
Glucose, Bld: 168 mg/dL — ABNORMAL HIGH (ref 70–99)
Glucose, Bld: 181 mg/dL — ABNORMAL HIGH (ref 70–99)
Glucose, Bld: 187 mg/dL — ABNORMAL HIGH (ref 70–99)
Potassium: 4.6 mmol/L (ref 3.5–5.1)
Potassium: 4.1 mmol/L (ref 3.5–5.1)
Potassium: 4.2 mmol/L (ref 3.5–5.1)
Sodium: 150 mmol/L — ABNORMAL HIGH (ref 135–145)
Sodium: 151 mmol/L — ABNORMAL HIGH (ref 135–145)
Sodium: 151 mmol/L — ABNORMAL HIGH (ref 135–145)

## 2021-06-01 LAB — COOXEMETRY PANEL
Carboxyhemoglobin: 1.9 % — ABNORMAL HIGH (ref 0.5–1.5)
Methemoglobin: 1 % (ref 0.0–1.5)
O2 Saturation: 68.5 %
Total hemoglobin: 7.4 g/dL — ABNORMAL LOW (ref 12.0–16.0)

## 2021-06-01 LAB — GLUCOSE, CAPILLARY
Glucose-Capillary: 159 mg/dL — ABNORMAL HIGH (ref 70–99)
Glucose-Capillary: 132 mg/dL — ABNORMAL HIGH (ref 70–99)
Glucose-Capillary: 105 mg/dL — ABNORMAL HIGH (ref 70–99)
Glucose-Capillary: 146 mg/dL — ABNORMAL HIGH (ref 70–99)
Glucose-Capillary: 168 mg/dL — ABNORMAL HIGH (ref 70–99)

## 2021-06-01 LAB — PREPARE RBC (CROSSMATCH)

## 2021-06-01 LAB — FIBRINOGEN: Fibrinogen: 417 mg/dL (ref 210–475)

## 2021-06-01 LAB — LACTATE DEHYDROGENASE: LDH: 463 U/L — ABNORMAL HIGH (ref 98–192)

## 2021-06-01 LAB — PROTIME-INR
INR: 2.3 — ABNORMAL HIGH (ref 0.8–1.2)
Prothrombin Time: 25.4 seconds — ABNORMAL HIGH (ref 11.4–15.2)

## 2021-06-01 LAB — LACTIC ACID, PLASMA: Lactic Acid, Venous: 1.3 mmol/L (ref 0.5–1.9)

## 2021-06-01 LAB — MAGNESIUM: Magnesium: 2.4 mg/dL (ref 1.7–2.4)

## 2021-06-01 MED ORDER — HEPARIN SODIUM (PORCINE) 1000 UNIT/ML DIALYSIS
3000.0000 [IU] | Freq: Once | INTRAMUSCULAR | Status: AC
  Administered 2021-06-02: 3000 [IU] via INTRAVENOUS
  Filled 2021-06-01 (×2): qty 3

## 2021-06-01 MED ORDER — NUTRISOURCE FIBER PO PACK
1.0000 | PACK | Freq: Three times a day (TID) | ORAL | Status: DC
  Administered 2021-06-01 – 2021-06-08 (×24): 1
  Filled 2021-06-01 (×25): qty 1

## 2021-06-01 MED ORDER — NOREPINEPHRINE 4 MG/250ML-% IV SOLN
0.0000 ug/min | INTRAVENOUS | Status: DC
  Administered 2021-06-01: 2 ug/min via INTRAVENOUS
  Filled 2021-06-01: qty 250

## 2021-06-01 MED ORDER — PROSOURCE TF PO LIQD
90.0000 mL | Freq: Two times a day (BID) | ORAL | Status: DC
  Administered 2021-06-01 – 2021-06-16 (×32): 90 mL
  Filled 2021-06-01 (×33): qty 90

## 2021-06-01 MED ORDER — FUROSEMIDE 10 MG/ML IJ SOLN
60.0000 mg | Freq: Two times a day (BID) | INTRAMUSCULAR | Status: AC
  Administered 2021-06-01 (×2): 60 mg via INTRAVENOUS
  Filled 2021-06-01 (×2): qty 6

## 2021-06-01 MED ORDER — MIDAZOLAM HCL 2 MG/2ML IJ SOLN
2.0000 mg | Freq: Once | INTRAMUSCULAR | Status: DC
  Filled 2021-06-01: qty 2

## 2021-06-01 MED ORDER — SODIUM CHLORIDE 0.9% IV SOLUTION: Freq: Once | INTRAVENOUS | Status: DC

## 2021-06-01 MED ORDER — GERHARDT'S BUTT CREAM
TOPICAL_CREAM | Freq: Three times a day (TID) | CUTANEOUS | Status: DC
  Administered 2021-06-06 – 2021-06-11 (×5): 1 via TOPICAL
  Filled 2021-06-01 (×3): qty 1

## 2021-06-01 MED ORDER — ALBUMIN HUMAN 25 % IV SOLN
12.5000 g | Freq: Two times a day (BID) | INTRAVENOUS | Status: AC
  Administered 2021-06-01: 12.5 g via INTRAVENOUS
  Filled 2021-06-01 (×2): qty 50

## 2021-06-01 MED ORDER — FREE WATER
200.0000 mL | Status: DC
  Administered 2021-06-01 – 2021-06-03 (×24): 200 mL

## 2021-06-01 MED ORDER — FUROSEMIDE 10 MG/ML IJ SOLN
INTRAMUSCULAR | Status: AC
  Filled 2021-06-01: qty 6

## 2021-06-01 NOTE — Progress Notes (Signed)
PT Cancellation Note  Patient Details Name: Randall Hodges MRN: [id number] DOB: July 31, 1950   Cancelled Treatment:    Reason Eval/Treat Not Completed: Medical issues which prohibited therapy (oxygenator malfunctioning and being changed at 2 PM. Pt on 100% vent support).  Wyona Almas, PT, DPT Acute Rehabilitation Services Pager [phone] Office [phone]    Deno Etienne 06/01/2021, 1:32 PM

## 2021-06-01 NOTE — Progress Notes (Signed)
NAME:  Randall Hodges, MRN:  [id number], DOB:  1951-01-09, LOS: 20 ADMISSION DATE:  05/01/2021, CONSULTATION DATE: 05/12/2021 REFERRING MD:  Dahlia Byes, MD, CHIEF COMPLAINT: Increasing shortness of breath  History of Present Illness:  71 year old male with coronary artery disease who initially presented with chest pain, noted to have multivessel coronary artery disease, he underwent CABG x3 on 04/1818 23, course was complicated with atrial fibrillation, frequent hiccups and aspiration pneumonia leading to hypoxia and increasing oxygen requirement. PCCM was consulted for evaluation and help with management  Patient stated hiccups are better but continued complain of shortness of breath, cough unable to bring up phlegm.  Pertinent  Medical History   Past Medical History:  Diagnosis Date   Allergic rhinitis    Prostate cancer ([place])     Significant Hospital Events: Including procedures, antibiotic start and stop dates in addition to other pertinent events   1/18 CABG 1/30 PCCM consult, Worsening O2 needs, likely aspiration, new pressor need 2/2 intubation, placed on VV ECMO 2/5 issues with recirc, bronch neg, improved with supine positioning 2/7 percutaneous tracheostomy 2/10 diuresis and weaning sedation. CT chest shows diffuse ground-glass opacification.   Interim History / Subjective:   Still having bleeding at the trach site. Some chatter overnight. Increased air hunger with increased agitation.   Objective   Blood pressure (!) 136/49, pulse 82, temperature 98.2 F (36.8 C), temperature source Core, resp. rate (!) 32, height 5\' 10"  (1.778 m), weight 86 kg, SpO2 92 %. CVP:  [4 mmHg-14 mmHg] 14 mmHg  Vent Mode: PCV FiO2 (%):  [40 %] 40 % Set Rate:  [22 bmp] 22 bmp PEEP:  [10 cmH20] 10 cmH20 Pressure Support:  [18 cmH20] 18 cmH20 Plateau Pressure:  [30 cmH20] 30 cmH20   Intake/Output Summary (Last 24 hours) at 06/01/2021 0815 Last data filed at 06/01/2021 0700 Gross per 24  hour  Intake 2897.12 ml  Output 3130 ml  Net -232.88 ml    Filed Weights   05/30/21 0600 05/31/21 0600 06/01/21 0500  Weight: 83.5 kg 84.4 kg 86 kg     Examination: General: average build appears acutely ill. On no sedative or pressor infusions.  HENT: tracheostomy and Cortrak in place without pressure injury. Cannula site intact. Minimal crusted blood.  Lungs: scattered crackles. On PCV with Pplat 24 driving pressure 18. 0.4/10.  VV  ECMO at 2550 rpm with sweep 5 Cardiovascular: HS normal  Abdomen: soft, still Flexiseal in place Extremities: edema +1 - much improved.  Neuro: moves all limbs and will open eyes weakly to command.  GU: Foley in place with abundant clear urine  Ancillary tests personally reviewed:   7.43/45/59 Na: 152 CO2 32  LDH 463 WBC 16.9 HB: 7.5 INR: 2.3  Assessment & Plan:  CAD with unstable angina s/p CABGx3 1/18 (LIMA-LAD, SVG-OM, SVG-left PDA) Presumed aspiration pneumonitis vs. HCAP vs.  culminating in severe ARDS with inability to ventilate along with high Aa gradient s/p VV ECMO cannulation 05/21/22.  CT shows predominant alveolitis - should resolve completely with time.  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  Hypoactive delirium   Plan:   - Continue to over sweep to prevent air hunger and anticipate longer run. Maintain lung protective ventilation.  - Continue to minimize sedation and begin mobilizing.  Need for delirium to clear.  - Continue BID baclofen and gabapentin for hiccups. - Taper steroids.  -  Transfuse 1 unit today.  - Continue carvedilol - Increase free water - Increase diuretics with increased fluid load today.  - PT/OT w/ reduced sedation to promote activity    Best Practice (right click and "Reselect all SmartList Selections" daily)   Diet/type: Vital @ 35mL/hr DVT prophylaxis: Bival CBG: yes,  SSI GI prophylaxis: PPI Lines: L [place] TLC, RIJ  Foley:  Yes, keep Code Status:  full code Last date of multidisciplinary goals of care discussion [daily at bedside]  CRITICAL CARE Performed by: Kipp Brood   Total critical care time: 40 minutes  Critical care time was exclusive of separately billable procedures and treating other patients.  Critical care was necessary to treat or prevent imminent or life-threatening deterioration.  Critical care was time spent personally by me on the following activities: development of treatment plan with patient and/or surrogate as well as nursing, discussions with consultants, evaluation of patient's response to treatment, examination of patient, obtaining history from patient or surrogate, ordering and performing treatments and interventions, ordering and review of laboratory studies, ordering and review of radiographic studies, pulse oximetry, re-evaluation of patient's condition and participation in multidisciplinary rounds.  Kipp Brood, MD [facility] ICU Physician [place]  Pager: [phone] Mobile: [phone] After hours: [phone].

## 2021-06-01 NOTE — Progress Notes (Signed)
Patient ID: Randall Hodges, male   DOB: [birth year], 71 y.o.   MRN: [id number]   Progress Note  Patient Name: Randall Hodges Date of Encounter: 06/01/2021  [facility] Cardiologist: [name], MD   Subjective   2/2: VV ECMO initiation, Crescent catheter right IJ 2/7: Tracheostomy 2/13 CT C/A/P: Diffuse ground-glass opacities consistent with atelectasis and edema presumably related to ECMO physiology and respiratory failure.  Underlying moderate interstitial lung disease mildly progressed from prior CT.  Remains on vent. Off precedex.  Confused.   Amio gtt stopped 2/11 by CCM. Remains in NSR  Had vomiting over the weekend. CXR appears a bit worse to me (bilateral interstitial and airspace disease).    LDH 615 -> 380 ->-> -> 345 -> 301 -> 321 -> 463  Vent FiO2 0.4. CVP 12-13 this morning, had Lasix 20 mg IV x 2 yesterday.   ECMO: Speed 2700 rpm Flow 3.4 L/min pVen -31 DeltaP 13 Sweep 5 PTT 59 (goal 50-80) LDH 463 ABG 7.46/46/59/91% SVO2 69% Lactate 1.3  Cardiac Studies: Echo (limited, 1/30): Echo reviewed, there is clear respirophasic variation of the interventricular septum and marked respirophasic variation of E inflow velocity on doppler evaluation of the mitral valve.  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  LV EF 45-50%.   RHC Procedural Findings (on norepinephrine 6): Hemodynamics (mmHg) RA mean 12 RV 37/12 PA 38/16, mean 27 PCWP mean 11 LV 108/12 AO 96/55 PAPI 1.8 Oxygen saturations: PA 54% AO 94% Cardiac Output (Fick) 5.59  Cardiac Index (Fick) 2.81  PVR 2.8 WU Simultaneous RV/LV tracings were obtained.  Difficult to interpret due to atrial fibrillation.  There was some suggestion of discordance (ventricular interdependence) but not clear.  Inpatient  Medications    Scheduled Meds:  sodium chloride   Intravenous Once   amLODipine  10 mg Per NG tube Daily   aspirin  81 mg Per Tube Daily   baclofen  5 mg Per Tube TID   carvedilol  12.5 mg Per Tube BID WC   chlorhexidine gluconate (MEDLINE KIT)  15 mL Mouth Rinse BID   Chlorhexidine Gluconate Cloth  6 each Topical Daily   colchicine  0.6 mg Per Tube Daily   feeding supplement (PROSource TF)  45 mL Per Tube TID   fiber  1 packet Per Tube BID   free water  200 mL Per Tube Q2H   furosemide  60 mg Intravenous BID   gabapentin  300 mg Per Tube QHS   guaiFENesin  30 mL Per Tube QID   insulin aspart  0-15 Units Subcutaneous Q4H   mouth rinse  15 mL Mouth Rinse 10 times per day   melatonin  5 mg Per Tube QHS   methylPREDNISolone (SOLU-MEDROL) injection  40 mg Intravenous Q24H   Followed by   Derrill Memo ON 06/05/2021] methylPREDNISolone (SOLU-MEDROL) injection  20 mg Intravenous Q24H   pantoprazole sodium  40 mg Per Tube BID   rosuvastatin  10 mg Per Tube Daily   sodium chloride flush  10-40 mL Intracatheter Q12H   sodium chloride flush  10-40 mL Intracatheter Q12H   sodium chloride flush  3 mL Intravenous Q12H   Continuous Infusions:  sodium chloride 10 mL/hr at 06/01/21 0700   albumin human Stopped (06/01/21 0616)   albumin human     albumin human Stopped (05/30/21 0800)   bivalirudin (ANGIOMAX)  infusion 0.5 mg/mL (Non-ACS indications) 0.065 mg/kg/hr (06/01/21 0700)   dexmedetomidine (PRECEDEX) IV infusion Stopped (05/27/21 0533)   feeding supplement (VITAL 1.5 CAL) 1,000 mL (05/31/21 1019)   PRN Meds: Place/Maintain arterial line **AND** sodium chloride, acetaminophen (TYLENOL) oral liquid 160 mg/5 mL, albumin human, albumin human, diazepam, docusate, fentaNYL (SUBLIMAZE) injection, levalbuterol, ondansetron (ZOFRAN) IV, oxyCODONE, polyvinyl alcohol, sennosides, sodium chloride flush   Vital Signs    Vitals:   06/01/21 0615 06/01/21 0630 06/01/21 0645 06/01/21 0700  BP:    (!)  136/49  Pulse: 76 80 80 82  Resp: (!) 37 (!) 41 (!) 32 (!) 32  Temp:      TempSrc:      SpO2: 91% 93% 93% 94%  Weight:      Height:        Intake/Output Summary (Last 24 hours) at 06/01/2021 0743 Last data filed at 06/01/2021 0700 Gross per 24 hour  Intake 3188.33 ml  Output 3145 ml  Net 43.33 ml   Last 3 Weights 06/01/2021 05/31/2021 05/30/2021  Weight (lbs) 189 lb 9.5 oz 186 lb 1.1 oz 184 lb 1.4 oz  Weight (kg) 86 kg 84.4 kg 83.5 kg      Telemetry    NSR 70-80s (personally reviewed)  Physical Exam   General: Awakens but confused.  Neck: RIJ Crescent cannula, no thyromegaly or thyroid nodule.  Lungs: Crackles at bases.  CV: Nondisplaced PMI.  Heart regular S1/S2, no S3/S4, no murmur.  1+ ankle edema.  Abdomen: Soft, nontender, no hepatosplenomegaly, no distention.  Skin: Intact without lesions or rashes.  Neurologic: Wakes up but confused.  Extremities: No clubbing or cyanosis.  HEENT: Normal.    Labs    High Sensitivity Troponin:   Recent Labs  Lab 05/11/2021 1321 05/12/2021 1518  TROPONINIHS 3 4     Chemistry Recent Labs  Lab 05/26/21 0328 05/26/21 4782 05/27/21 0350 05/27/21 0546 05/30/21 0436 05/30/21 1037 05/31/21 0330 05/31/21 1015 05/31/21 1604 05/31/21 2005 06/01/21 0350 06/01/21 0353 06/01/21 0503  NA 150*   150*   < > 150*   < > 152*   < > 149*   < > 151*   < > 150* 151* 152*  K 4.3   4.2   < > 4.7   < > 3.8   < > 4.2   < > 3.8   < > 4.6 4.6 4.5  CL 109   < > 114*   < > 110   < > 111  --  113*  --  113*  --   --   CO2 35*   < > 31   < > 34*   < > 32  --  31  --  30  --   --   GLUCOSE 181*   < > 185*   < > 135*   < > 174*  --  160*  --  168*  --   --   BUN 56*   < > 55*   < > 77*   < > 78*  --  79*  --  87*  --   --   CREATININE 1.10   < > 1.02   < > 1.14   < > 1.17  --  1.19  --  1.29*  --   --   CALCIUM 7.8*   < > 7.9*   < > 8.1*   < > 8.0*  --  7.9*  --  8.0*  --   --  MG 2.2  --  2.3  --   --   --   --   --   --   --  2.4  --   --   PROT  4.0*  --  4.2*   < > 4.2*  --  4.2*  --   --   --  4.2*  --   --   ALBUMIN 1.9*  --  2.0*   < > 2.4*  --  2.4*  --   --   --  2.4*  --   --   AST 196*  --  86*   < > 36  --  43*  --   --   --  233*  --   --   ALT 387*  --  306*   < > 102*  --  83*  --   --   --  179*  --   --   ALKPHOS 189*  --  185*   < > 105  --  116  --   --   --  177*  --   --   BILITOT 1.9*  --  1.3*   < > 0.9  --  1.2  --   --   --  1.6*  --   --   GFRNONAA >60   < > >60   < > >60   < > >60  --  >60  --  59*  --   --   ANIONGAP 6   < > 5   < > 8   < > 6  --  7  --  7  --   --    < > = values in this interval not displayed.    Lipids  No results for input(s): CHOL, TRIG, HDL, LABVLDL, LDLCALC, CHOLHDL in the last 168 hours.   Hematology Recent Labs  Lab 05/31/21 0330 05/31/21 1015 05/31/21 1604 05/31/21 2005 06/01/21 0350 06/01/21 0353 06/01/21 0503  WBC 16.9*  --  15.9*  --  15.1*  --   --   RBC 2.75*  --  2.67*  --  2.52*  --   --   HGB 8.1*   < > 7.8*   < > 7.2* 7.5* 7.5*  HCT 25.4*   < > 24.8*   < > 23.8* 22.0* 22.0*  MCV 92.4  --  92.9  --  94.4  --   --   MCH 29.5  --  29.2  --  28.6  --   --   MCHC 31.9  --  31.5  --  30.3  --   --   RDW 17.2*  --  17.2*  --  17.5*  --   --   PLT 177  --  178  --  182  --   --    < > = values in this interval not displayed.   Thyroid No results for input(s): TSH, FREET4 in the last 168 hours.  BNPNo results for input(s): BNP, PROBNP in the last 168 hours.  DDimer No results for input(s): DDIMER in the last 168 hours.   Radiology    DG CHEST PORT 1 VIEW  Result Date: 05/31/2021 CLINICAL DATA:  71 year old male with history of respiratory failure. EXAM: PORTABLE CHEST 1 VIEW COMPARISON:  Chest x-ray 05/30/2021. FINDINGS: A tracheostomy tube is in place with tip 4.7 cm above the carina. A feeding tube is seen extending into the abdomen, however,  the tip of the feeding tube extends below the lower margin of the image. ECMO cannula projecting over the right-side of  the mediastinum. Status post median sternotomy for CABG. Widespread areas of interstitial prominence and patchy airspace disease again noted throughout the lungs bilaterally. Small bilateral pleural effusions. No pneumothorax. Heart size is mildly enlarged. Atherosclerotic calcifications in the thoracic aorta. IMPRESSION: 1. The appearance the chest is very similar to the prior examination, with diffuse interstitial and airspace disease in the lungs, which could reflect pulmonary edema and/or multilobar pneumonia. 2. Aortic atherosclerosis. 3. Support apparatus and postoperative changes, as above. Electronically Signed   By: Vinnie Langton M.D.   On: 05/31/2021 08:32    Cardiac Studies   Cath 05/03/2021 Distal left main [place] 111 bifurcation stenosis with 75% left main, 90% ostial to proximal LAD, and 80-90% ostial circumflex (difficult to assess due to heavy calcification). Severe mid circumflex disease with 70% eccentric mid stenosis and second obtuse marginal containing ostial to proximal greater than 80% stenosis.  (Bifurcation [place] 111 Severe calcification in left main and LAD in particular with diffuse 50% narrowing from proximal to mid vessel and tandem 70% stenoses in the mid LAD. Nondominant right coronary Normal LV function.  EF 55%.  LVEDP normal.    Patient Profile     71 y.o. male with PMH of PVCs presented with chest pain. Cardiac cath by Dr. Tamala Julian on 05/16/2021 showed 75% left main, 90% ost to prox LAD, 80-90% ost LCx, 70% mid LCx, 80% OM2, 50% prox to mid LAD, 70% mid LAD, EF 55%. Patient underwent CABG x 3 on 05/13/2021. Post op course complicated afib, treated with amio. CXR showed opacity in bilateral lung, started abx on 1/25 and diuretic. Started on Eliquis due to recurrence of afib.    Assessment & Plan    1. CAD: Admitted with unstable angina, cath with severe left main and proximal LAD/LCx disease (nondominant RCA).  CABG x 3 on 1/18 with LIMA-LAD, SVG-OM, SVG-left PDA.   No s/s angina  - Continue ASA 81, statin.  2. 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 RHC 1/30 with equalization of diastolic pressures.  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  Repeated echo 2/2 still showed respirophasic septal variation but not as impressive. CVP 12-13 today.  Lungs still with diffuse bilateral infiltrates on CXR. Co-ox 69%. - Increase Lasix to 60 mg IV bid x 2 doses today, will give blood with first dose and albumen with 2nd dose to prevent chugging.  - With concern for development of post-surgical effusive/constrictive pericarditis, he is on colchicine.  - ?need for surgical intervention on effusive/constrictive pericarditis. Will need improvement of lung disease first then reassess, repeat echo not as impressive.   3. 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  Suspect primarily septic/vasodilatory shock. He is currently off vasopressin and NE with MAP now high. 4. Hiccups: Still present.  - On baclofen.  - Gabapentin started 5. PNA with acute hypoxic respiratory failure: Of note, he does appear to have had some pre-existing ILD from 2019 CT chest (?sarcoidosis). CXR with persistent bilateral infiltrates, possible mild improvement compared to yesterday.  Have thought most likely aspiration  PNA/pneumonitis in setting of intractable hiccups. He has developred ARDS.  Afebrile. COVID was negative.  FiO2 0.4 on vent.  We have struggled to wean sweep, now back to 5. CXR similar with bilateral diffuse airspace disease. Has  tracheostomy. CT chest 2/9 with persistent lung infiltrates.  ABG with CO2 stable but O2 on the lower side at 59.  LDH rising, up to 463 today.  - Draw pre and post ABG to make sure oxygenator is working properly.  - Discussed with pulmonary, think amiodarone toxicity unlikely as he has only been on it post-op. However they have asked to stop for now.  - Completed vancomycin and meropenem.   - Vent and sedation per CCM.  - Solumedrol 40 mg IV daily with gradual taper, ESR now normal.  6. Atypical atrial flutter/PVCs:  Remains in NSR - Off amiodarone now.  - Bivalirudin gtt, goal PTT 50-80. Discussed dosing with PharmD personally. 7. AKI: Creatinine mildly higher 1.29.  Follow closely.  8. Elevated LFTs: Suspect shock liver, follow CMET.  LFTs trending down.  9. Anemia: He has had bleeding at trach site.  Hgb down to 7.2.  - Transfuse 1 unit PRBCs this morning (with Lasix).  10. Hypernatremia: Na 150 today.  - Increase free water to 200 q2.  11. HTN: MAP stable. Has not been getting hydralazine.  - Continue Coreg and amlodipine for now.  - Stop hydralazine.   CRITICAL CARE Performed by: Loralie Champagne  Total critical care time: 40 minutes  Critical care time was exclusive of separately billable procedures and treating other patients.  Critical care was necessary to treat or prevent imminent or life-threatening deterioration.  Critical care was time spent personally by me on the following activities: development of treatment plan with patient and/or surrogate as well as nursing, discussions with consultants, evaluation of patient's response to treatment, examination of patient, obtaining history from patient or surrogate, ordering and performing treatments and interventions, ordering and review of laboratory studies, ordering and review of radiographic studies, pulse oximetry and re-evaluation of patient's condition.  Loralie Champagne MD 06/01/2021 7:43 AM

## 2021-06-01 NOTE — Progress Notes (Addendum)
Nutrition Follow-up  DOCUMENTATION CODES:   Not applicable  INTERVENTION:   Tube feeding via post pyloric Cortrak tube: Vital 1.5 at 60 ml/h (1440 ml per day) Increase Prosource TF to 90 ml BID  Provides 2320 kcal, 141 gm protein, 1094 ml free water daily  200 ml free water every 2 hours Total free water: 3494 ml  Increase Nutrisource Fiber to TID  NUTRITION DIAGNOSIS:   Inadequate oral intake related to inability to eat as evidenced by NPO status. Ongoing, being addressed.   GOAL:   Patient will meet greater than or equal to 90% of their needs Met with TF at goal.  MONITOR:   TF tolerance, Diet advancement, Labs, Weight trends  REASON FOR ASSESSMENT:   Rounds    ASSESSMENT:   71 yo male admitted with acute coronary syndrome, underwent CABG x 3 on 1/18; developed intractable hiccups/muscle spasms, aspiration pneumonia with respiratory failure. PMH includes prostate cancer  Pt remains on vent support on VV ECMO.  SLP eval this am, pt not medically ready for PMV assessment.  Weight has trended back up to 189 lb (86 kg). Mild to moderate edema  noted.  Rectal tube remains in place with output >1 L. Now off colace, miralax, and reglan.  Free water increased, albumin given, lasix increased.  Spoke with RN who is at bedside, pt being re-adjsuted in the bed.    1/18 s/p CABG x 3 1/29 tx back to ICU for hypotension, worsening respiratory status 1/30 s/p cortrak placement; tip post pyloric  2/2 intubated, VV ECMO cannulation for ARDS 2/7 s/p trach placement  Medications reviewed and include: nutrisource fiber BID 2/11, lasix 60 mg BID (through 2/14), SSI, solu-medrol, protonix Albumin Angiomax Precedex now off  Labs reviewed: Na 152 LDH: 463 CBG's: 132-170   UOP: 2130 ml  I&O: +8 L  Diet Order:   Diet Order             Diet NPO time specified  Diet effective midnight                   EDUCATION NEEDS:   Education needs have been  addressed  Skin:  Skin Assessment: Skin Integrity Issues: Skin Integrity Issues:: Stage II Stage II: nose d/t BiPap mask Incisions: sternum, leg, groin  Last BM:  1150 ml via rectal tube x 24 hrs  Height:   Ht Readings from Last 1 Encounters:  06/01/21 _0  (1.778 m)    Weight:   Wt Readings from Last 1 Encounters:  06/01/21 86 kg    BMI:  Body mass index is 27.2 kg/m.  Estimated Nutritional Needs:   Kcal:  2000-2200 kcals  Protein:  125-150 g  Fluid:  1.8 L  Xayla Puzio P., RD, LDN, CNSC See AMiON for contact information

## 2021-06-01 NOTE — Progress Notes (Addendum)
ANTICOAGULATION CONSULT NOTE  Pharmacy Consult for Bivalirudin Indication:  ECMO  Allergies  Allergen Reactions   Amoxicillin-Pot Clavulanate     Per pt report on 08/19/20, makes his urine dark colored   Atorvastatin Other (See Comments)     ( pt states causing runny nose, headaches, issue w/ urination)   Plant Derived Enzymes     Other reaction(s): Unknown   Trichophyton Other (See Comments)    Patient Measurements: Height: 5\' 10"  (177.8 cm) Weight: 86 kg (189 lb 9.5 oz) IBW/kg (Calculated) : 73  Vital Signs: Temp: 98.2 F (36.8 C) (02/13 0400) Temp Source: Core (02/13 0400) BP: 148/41 (02/13 0500) Pulse Rate: 80 (02/13 0500)  Labs: Recent Labs    05/30/21 0436 05/30/21 1037 05/31/21 0330 05/31/21 1015 05/31/21 1604 05/31/21 2005 06/01/21 0350 06/01/21 0353 06/01/21 0503  HGB 8.8*   < > 8.1*   < > 7.8*   < > 7.2* 7.5* 7.5*  HCT 28.3*   < > 25.4*   < > 24.8*   < > 23.8* 22.0* 22.0*  PLT 186   < > 177  --  178  --  182  --   --   APTT 52*   < > 56*  --  59*  --  59*  --   --   LABPROT 24.0*  --  25.1*  --   --   --  25.4*  --   --   INR 2.2*  --  2.3*  --   --   --  2.3*  --   --   CREATININE 1.14   < > 1.17  --  1.19  --  1.29*  --   --    < > = values in this interval not displayed.     Estimated Creatinine Clearance: 54.2 mL/min (A) (by C-G formula based on SCr of 1.29 mg/dL (H)).   Medical History: Past Medical History:  Diagnosis Date   Allergic rhinitis    Prostate cancer ([place])     Medications:  Infusions:   sodium chloride 10 mL/hr at 06/01/21 0500   albumin human 12.5 g (06/01/21 0523)   albumin human Stopped (05/30/21 0800)   bivalirudin (ANGIOMAX) infusion 0.5 mg/mL (Non-ACS indications) 0.065 mg/kg/hr (06/01/21 0500)   dexmedetomidine (PRECEDEX) IV infusion Stopped (05/27/21 0533)   feeding supplement (VITAL 1.5 CAL) 1,000 mL (05/31/21 1019)    Assessment: 70 year old male with coronary artery disease who initially presented with chest  pain, noted to have multivessel coronary artery disease. He underwent CABG x3 on 04/19/2021; course was complicated with atrial fibrillation, frequent hiccups and aspiration pneumonia leading to hypoxia and increasing oxygen requirement. Patient now requiring VV ECMO. Pharmacy consulted for bivalirudin IV.   aPTT 59 seconds, therapeutic  Current rate: Bivalirudin 0.065mg /kg/hr. Running appropriately in room.   Noted to have some bleeding from cannula and trach site yesterday. Continued this morning with some improvement. Hgb down to 7.2.  Goal of Therapy:  aPTT 50-80 seconds Monitor platelets by anticoagulation protocol: Yes   Plan:  Continue bivalirudin at 0.065 mg/kg/hr Monitor q12h CBC closely for s/sx bleeding  Thank you for allowing pharmacy to participate in this patient's care.  Levonne Spiller, PharmD PGY1 Acute Care Resident  06/01/2021,6:54 AM  _________________________________________ Addendum: Bivalirudin held for 2 hours (from 0800-1000) per MD request. Transfusing 1 units PRBC. Bivalirudin now resumed appropriately.   Thank you for allowing pharmacy to participate in this patient's care.  Levonne Spiller, PharmD PGY1 Acute  Care Resident  06/01/2021,11:20 AM

## 2021-06-01 NOTE — Progress Notes (Signed)
SLP Cancellation Note  Patient Details Name: Randall Hodges MRN: [id number] DOB: 1950-06-25   Cancelled treatment:       Reason Eval/Treat Not Completed: Patient not medically ready- will continue to follow along in chart for appropriateness for PMV assessment.  Joniqua Sidle L. Tivis Ringer, [place] Office number [phone] Pager [phone]    Assunta Curtis 06/01/2021, 8:48 AM

## 2021-06-01 NOTE — Progress Notes (Signed)
ANTICOAGULATION CONSULT NOTE  Pharmacy Consult for Bivalirudin Indication:  ECMO  Allergies  Allergen Reactions   Amoxicillin-Pot Clavulanate     Per pt report on 08/19/20, makes his urine dark colored   Atorvastatin Other (See Comments)     ( pt states causing runny nose, headaches, issue w/ urination)   Plant Derived Enzymes     Other reaction(s): Unknown   Trichophyton Other (See Comments)    Patient Measurements: Height: 5\' 10"  (177.8 cm) Weight: 86 kg (189 lb 9.5 oz) IBW/kg (Calculated) : 73  Vital Signs: Temp: 98.2 F (36.8 C) (02/13 1530) Temp Source: Axillary (02/13 1530) BP: 163/72 (02/13 1648) Pulse Rate: 76 (02/13 1648)  Labs: Recent Labs    05/30/21 0436 05/30/21 1037 05/31/21 0330 05/31/21 1015 05/31/21 1604 05/31/21 2005 06/01/21 0350 06/01/21 0353 06/01/21 1756 06/01/21 1759 06/01/21 1804  HGB 8.8*   < > 8.1*   < > 7.8*   < > 7.2*   < > 9.9* 9.9* 9.4*  HCT 28.3*   < > 25.4*   < > 24.8*   < > 23.8*   < > 29.0* 29.0* 29.9*  PLT 186   < > 177  --  178  --  182  --   --   --  171  APTT 52*   < > 56*  --  59*  --  59*  --   --   --  53*  LABPROT 24.0*  --  25.1*  --   --   --  25.4*  --   --   --   --   INR 2.2*  --  2.3*  --   --   --  2.3*  --   --   --   --   CREATININE 1.14   < > 1.17  --  1.19  --  1.29*  --   --   --  1.28*   < > = values in this interval not displayed.     Estimated Creatinine Clearance: 54.7 mL/min (A) (by C-G formula based on SCr of 1.28 mg/dL (H)).   Medical History: Past Medical History:  Diagnosis Date   Allergic rhinitis    Prostate cancer ([place])     Medications:  Infusions:   sodium chloride Stopped (06/01/21 1121)   albumin human Stopped (06/01/21 0616)   albumin human 60 mL/hr at 06/01/21 1800   albumin human Stopped (05/30/21 0800)   bivalirudin (ANGIOMAX) infusion 0.5 mg/mL (Non-ACS indications) 0.065 mg/kg/hr (06/01/21 1800)   dexmedetomidine (PRECEDEX) IV infusion 0.5 mcg/kg/hr (06/01/21 1344)   feeding  supplement (VITAL 1.5 CAL) 60 mL/hr at 06/01/21 1800    Assessment: 71 year old male with coronary artery disease who initially presented with chest pain, noted to have multivessel coronary artery disease. He underwent CABG x3 on 05/09/2021; course was complicated with atrial fibrillation, frequent hiccups and aspiration pneumonia leading to hypoxia and increasing oxygen requirement. Patient now requiring VV ECMO. Pharmacy consulted for bivalirudin IV.   aPTT 53 seconds, therapeutic but slightly lowe than earlier today on Bivalirudin 0.065mg /kg/hr. Bivalirudin drip was held earlier today for 2 hours during PRBC administration HGB 9 tonight, pltc stable 170s LDH trending up 300>400 watch, fibrinogen trend up 300>400 watch   Noted to have some bleeding from cannula and trach site yesterday. Continued this morning with some improvement after thrombi pad and surgicil. .  Goal of Therapy:  aPTT 50-80 seconds Monitor platelets by anticoagulation protocol: Yes   Plan:  Continue  bivalirudin at 0.065 mg/kg/hr Monitor q12h CBC closely for s/sx bleeding    Bonnita Nasuti Pharm.D. CPP, BCPS Clinical Pharmacist [phone] 06/01/2021 7:21 PM

## 2021-06-01 NOTE — Progress Notes (Signed)
Patient ID: Randall Hodges, male   DOB: January 16, 1951, 71 y.o.   MRN: [id number] Extracorporeal support note   ECLS support day: 10 Indication: ARDS  Configuration: VV  Drainage cannula: Crescent R IJ Return cannula: Crescent R IJ  Pump speed: 2700 rpm Pump flow: 3.4 L/min Pump used: Cardiohelp  Sweep gas: 5    Circuit check: Small amount thrombus. Good color change. LDH higher 463  Anticoagulant: Bivalirudin, PTT goal 50-80. PTT 56  Changes in support: stable FiO2 0.4, Sweep 5   Anticipated goals/duration of support: Wean to discontinuation  Loralie Champagne, MD  7:41 AM

## 2021-06-02 ENCOUNTER — Inpatient Hospital Stay (HOSPITAL_COMMUNITY): Payer: PPO

## 2021-06-02 DIAGNOSIS — I249 Acute ischemic heart disease, unspecified: Secondary | ICD-10-CM | POA: Diagnosis not present

## 2021-06-02 DIAGNOSIS — I361 Nonrheumatic tricuspid (valve) insufficiency: Secondary | ICD-10-CM

## 2021-06-02 DIAGNOSIS — Z9281 Personal history of extracorporeal membrane oxygenation (ECMO): Secondary | ICD-10-CM | POA: Diagnosis not present

## 2021-06-02 DIAGNOSIS — J9601 Acute respiratory failure with hypoxia: Secondary | ICD-10-CM | POA: Diagnosis not present

## 2021-06-02 DIAGNOSIS — I213 ST elevation (STEMI) myocardial infarction of unspecified site: Secondary | ICD-10-CM | POA: Diagnosis not present

## 2021-06-02 LAB — ECHOCARDIOGRAM LIMITED
Height: 70 in
S' Lateral: 2.9 cm
Weight: 3019.42 oz

## 2021-06-02 LAB — POCT I-STAT 7, (LYTES, BLD GAS, ICA,H+H)
Acid-Base Excess: 5 mmol/L — ABNORMAL HIGH (ref 0.0–2.0)
Acid-Base Excess: 6 mmol/L — ABNORMAL HIGH (ref 0.0–2.0)
Acid-Base Excess: 6 mmol/L — ABNORMAL HIGH (ref 0.0–2.0)
Acid-Base Excess: 3 mmol/L — ABNORMAL HIGH (ref 0.0–2.0)
Acid-Base Excess: 5 mmol/L — ABNORMAL HIGH (ref 0.0–2.0)
Acid-Base Excess: 3 mmol/L — ABNORMAL HIGH (ref 0.0–2.0)
Acid-Base Excess: 4 mmol/L — ABNORMAL HIGH (ref 0.0–2.0)
Acid-Base Excess: 5 mmol/L — ABNORMAL HIGH (ref 0.0–2.0)
Acid-Base Excess: 7 mmol/L — ABNORMAL HIGH (ref 0.0–2.0)
Acid-Base Excess: 4 mmol/L — ABNORMAL HIGH (ref 0.0–2.0)
Bicarbonate: 29.4 mmol/L — ABNORMAL HIGH (ref 20.0–28.0)
Bicarbonate: 29.9 mmol/L — ABNORMAL HIGH (ref 20.0–28.0)
Bicarbonate: 31.7 mmol/L — ABNORMAL HIGH (ref 20.0–28.0)
Bicarbonate: 30.1 mmol/L — ABNORMAL HIGH (ref 20.0–28.0)
Bicarbonate: 31.1 mmol/L — ABNORMAL HIGH (ref 20.0–28.0)
Bicarbonate: 28 mmol/L (ref 20.0–28.0)
Bicarbonate: 28.1 mmol/L — ABNORMAL HIGH (ref 20.0–28.0)
Bicarbonate: 29.3 mmol/L — ABNORMAL HIGH (ref 20.0–28.0)
Bicarbonate: 30 mmol/L — ABNORMAL HIGH (ref 20.0–28.0)
Bicarbonate: 29.7 mmol/L — ABNORMAL HIGH (ref 20.0–28.0)
Calcium, Ion: 1.14 mmol/L — ABNORMAL LOW (ref 1.15–1.40)
Calcium, Ion: 1.18 mmol/L (ref 1.15–1.40)
Calcium, Ion: 1.22 mmol/L (ref 1.15–1.40)
Calcium, Ion: 0.91 mmol/L — ABNORMAL LOW (ref 1.15–1.40)
Calcium, Ion: 1.27 mmol/L (ref 1.15–1.40)
Calcium, Ion: 1.19 mmol/L (ref 1.15–1.40)
Calcium, Ion: 1.2 mmol/L (ref 1.15–1.40)
Calcium, Ion: 1.15 mmol/L (ref 1.15–1.40)
Calcium, Ion: 1.21 mmol/L (ref 1.15–1.40)
Calcium, Ion: 1.21 mmol/L (ref 1.15–1.40)
HCT: 26 % — ABNORMAL LOW (ref 39.0–52.0)
HCT: 27 % — ABNORMAL LOW (ref 39.0–52.0)
HCT: 27 % — ABNORMAL LOW (ref 39.0–52.0)
HCT: 27 % — ABNORMAL LOW (ref 39.0–52.0)
HCT: 28 % — ABNORMAL LOW (ref 39.0–52.0)
HCT: 27 % — ABNORMAL LOW (ref 39.0–52.0)
HCT: 32 % — ABNORMAL LOW (ref 39.0–52.0)
HCT: 30 % — ABNORMAL LOW (ref 39.0–52.0)
HCT: 30 % — ABNORMAL LOW (ref 39.0–52.0)
HCT: 29 % — ABNORMAL LOW (ref 39.0–52.0)
Hemoglobin: 8.8 g/dL — ABNORMAL LOW (ref 13.0–17.0)
Hemoglobin: 9.2 g/dL — ABNORMAL LOW (ref 13.0–17.0)
Hemoglobin: 9.2 g/dL — ABNORMAL LOW (ref 13.0–17.0)
Hemoglobin: 9.2 g/dL — ABNORMAL LOW (ref 13.0–17.0)
Hemoglobin: 9.5 g/dL — ABNORMAL LOW (ref 13.0–17.0)
Hemoglobin: 10.9 g/dL — ABNORMAL LOW (ref 13.0–17.0)
Hemoglobin: 9.2 g/dL — ABNORMAL LOW (ref 13.0–17.0)
Hemoglobin: 10.2 g/dL — ABNORMAL LOW (ref 13.0–17.0)
Hemoglobin: 10.2 g/dL — ABNORMAL LOW (ref 13.0–17.0)
Hemoglobin: 9.9 g/dL — ABNORMAL LOW (ref 13.0–17.0)
O2 Saturation: 96 %
O2 Saturation: 97 %
O2 Saturation: 100 %
O2 Saturation: 91 %
O2 Saturation: 92 %
O2 Saturation: 92 %
O2 Saturation: 96 %
O2 Saturation: 100 %
O2 Saturation: 99 %
O2 Saturation: 97 %
Patient temperature: 36.7
Patient temperature: 36.8
Patient temperature: 36.9
Patient temperature: 36.8
Patient temperature: 36.8
Patient temperature: 36.7
Patient temperature: 97.6
Patient temperature: 36.7
Patient temperature: 36.7
Patient temperature: 36.8
Potassium: 4.1 mmol/L (ref 3.5–5.1)
Potassium: 4.3 mmol/L (ref 3.5–5.1)
Potassium: 4.2 mmol/L (ref 3.5–5.1)
Potassium: 4.4 mmol/L (ref 3.5–5.1)
Potassium: 4.5 mmol/L (ref 3.5–5.1)
Potassium: 4.1 mmol/L (ref 3.5–5.1)
Potassium: 4.2 mmol/L (ref 3.5–5.1)
Potassium: 4.2 mmol/L (ref 3.5–5.1)
Potassium: 4.2 mmol/L (ref 3.5–5.1)
Potassium: 4.4 mmol/L (ref 3.5–5.1)
Sodium: 152 mmol/L — ABNORMAL HIGH (ref 135–145)
Sodium: 154 mmol/L — ABNORMAL HIGH (ref 135–145)
Sodium: 151 mmol/L — ABNORMAL HIGH (ref 135–145)
Sodium: 151 mmol/L — ABNORMAL HIGH (ref 135–145)
Sodium: 152 mmol/L — ABNORMAL HIGH (ref 135–145)
Sodium: 151 mmol/L — ABNORMAL HIGH (ref 135–145)
Sodium: 152 mmol/L — ABNORMAL HIGH (ref 135–145)
Sodium: 152 mmol/L — ABNORMAL HIGH (ref 135–145)
Sodium: 152 mmol/L — ABNORMAL HIGH (ref 135–145)
Sodium: 152 mmol/L — ABNORMAL HIGH (ref 135–145)
TCO2: 31 mmol/L (ref 22–32)
TCO2: 31 mmol/L (ref 22–32)
TCO2: 33 mmol/L — ABNORMAL HIGH (ref 22–32)
TCO2: 32 mmol/L (ref 22–32)
TCO2: 33 mmol/L — ABNORMAL HIGH (ref 22–32)
TCO2: 29 mmol/L (ref 22–32)
TCO2: 29 mmol/L (ref 22–32)
TCO2: 31 mmol/L (ref 22–32)
TCO2: 31 mmol/L (ref 22–32)
TCO2: 31 mmol/L (ref 22–32)
pCO2 arterial: 40.8 mmHg (ref 32–48)
pCO2 arterial: 40.9 mmHg (ref 32–48)
pCO2 arterial: 53.5 mmHg — ABNORMAL HIGH (ref 32–48)
pCO2 arterial: 54.6 mmHg — ABNORMAL HIGH (ref 32–48)
pCO2 arterial: 55.8 mmHg — ABNORMAL HIGH (ref 32–48)
pCO2 arterial: 38.1 mmHg (ref 32–48)
pCO2 arterial: 42.6 mmHg (ref 32–48)
pCO2 arterial: 35.4 mmHg (ref 32–48)
pCO2 arterial: 41.8 mmHg (ref 32–48)
pCO2 arterial: 47.1 mmHg (ref 32–48)
pH, Arterial: 7.464 — ABNORMAL HIGH (ref 7.35–7.45)
pH, Arterial: 7.471 — ABNORMAL HIGH (ref 7.35–7.45)
pH, Arterial: 7.381 (ref 7.35–7.45)
pH, Arterial: 7.339 — ABNORMAL LOW (ref 7.35–7.45)
pH, Arterial: 7.363 (ref 7.35–7.45)
pH, Arterial: 7.425 (ref 7.35–7.45)
pH, Arterial: 7.474 — ABNORMAL HIGH (ref 7.35–7.45)
pH, Arterial: 7.452 — ABNORMAL HIGH (ref 7.35–7.45)
pH, Arterial: 7.535 — ABNORMAL HIGH (ref 7.35–7.45)
pH, Arterial: 7.406 (ref 7.35–7.45)
pO2, Arterial: 79 mmHg — ABNORMAL LOW (ref 83–108)
pO2, Arterial: 79 mmHg — ABNORMAL LOW (ref 83–108)
pO2, Arterial: 464 mmHg — ABNORMAL HIGH (ref 83–108)
pO2, Arterial: 66 mmHg — ABNORMAL LOW (ref 83–108)
pO2, Arterial: 67 mmHg — ABNORMAL LOW (ref 83–108)
pO2, Arterial: 59 mmHg — ABNORMAL LOW (ref 83–108)
pO2, Arterial: 79 mmHg — ABNORMAL LOW (ref 83–108)
pO2, Arterial: 115 mmHg — ABNORMAL HIGH (ref 83–108)
pO2, Arterial: 481 mmHg — ABNORMAL HIGH (ref 83–108)
pO2, Arterial: 86 mmHg (ref 83–108)

## 2021-06-02 LAB — POCT I-STAT EG7
Acid-Base Excess: 3 mmol/L — ABNORMAL HIGH (ref 0.0–2.0)
Acid-Base Excess: 4 mmol/L — ABNORMAL HIGH (ref 0.0–2.0)
Acid-Base Excess: 5 mmol/L — ABNORMAL HIGH (ref 0.0–2.0)
Bicarbonate: 28.8 mmol/L — ABNORMAL HIGH (ref 20.0–28.0)
Bicarbonate: 27.7 mmol/L (ref 20.0–28.0)
Bicarbonate: 29.7 mmol/L — ABNORMAL HIGH (ref 20.0–28.0)
Calcium, Ion: 1.17 mmol/L (ref 1.15–1.40)
Calcium, Ion: 1.17 mmol/L (ref 1.15–1.40)
Calcium, Ion: 1.15 mmol/L (ref 1.15–1.40)
HCT: 24 % — ABNORMAL LOW (ref 39.0–52.0)
HCT: 28 % — ABNORMAL LOW (ref 39.0–52.0)
HCT: 29 % — ABNORMAL LOW (ref 39.0–52.0)
Hemoglobin: 8.2 g/dL — ABNORMAL LOW (ref 13.0–17.0)
Hemoglobin: 9.5 g/dL — ABNORMAL LOW (ref 13.0–17.0)
Hemoglobin: 9.9 g/dL — ABNORMAL LOW (ref 13.0–17.0)
O2 Saturation: 74 %
O2 Saturation: 79 %
O2 Saturation: 69 %
Patient temperature: 36.9
Patient temperature: 36
Patient temperature: 36.7
Potassium: 4 mmol/L (ref 3.5–5.1)
Potassium: 3.8 mmol/L (ref 3.5–5.1)
Potassium: 4.1 mmol/L (ref 3.5–5.1)
Sodium: 153 mmol/L — ABNORMAL HIGH (ref 135–145)
Sodium: 153 mmol/L — ABNORMAL HIGH (ref 135–145)
Sodium: 153 mmol/L — ABNORMAL HIGH (ref 135–145)
TCO2: 30 mmol/L (ref 22–32)
TCO2: 29 mmol/L (ref 22–32)
TCO2: 31 mmol/L (ref 22–32)
pCO2, Ven: 50.3 mmHg (ref 44–60)
pCO2, Ven: 36.9 mmHg — ABNORMAL LOW (ref 44–60)
pCO2, Ven: 45.4 mmHg (ref 44–60)
pH, Ven: 7.365 (ref 7.25–7.43)
pH, Ven: 7.479 — ABNORMAL HIGH (ref 7.25–7.43)
pH, Ven: 7.422 (ref 7.25–7.43)
pO2, Ven: 41 mmHg
pO2, Ven: 38 mmHg (ref 32–45)
pO2, Ven: 35 mmHg (ref 32–45)

## 2021-06-02 LAB — BASIC METABOLIC PANEL
Anion gap: 6 (ref 5–15)
Anion gap: 7 (ref 5–15)
BUN: 85 mg/dL — ABNORMAL HIGH (ref 8–23)
BUN: 80 mg/dL — ABNORMAL HIGH (ref 8–23)
CO2: 30 mmol/L (ref 22–32)
CO2: 28 mmol/L (ref 22–32)
Calcium: 7.9 mg/dL — ABNORMAL LOW (ref 8.9–10.3)
Calcium: 7.8 mg/dL — ABNORMAL LOW (ref 8.9–10.3)
Chloride: 115 mmol/L — ABNORMAL HIGH (ref 98–111)
Chloride: 115 mmol/L — ABNORMAL HIGH (ref 98–111)
Creatinine, Ser: 1.25 mg/dL — ABNORMAL HIGH (ref 0.61–1.24)
Creatinine, Ser: 1.28 mg/dL — ABNORMAL HIGH (ref 0.61–1.24)
GFR, Estimated: >60 mL/min (ref >60)
GFR, Estimated: 60 mL/min — ABNORMAL LOW (ref >60)
Glucose, Bld: 148 mg/dL — ABNORMAL HIGH (ref 70–99)
Glucose, Bld: 132 mg/dL — ABNORMAL HIGH (ref 70–99)
Potassium: 4.3 mmol/L (ref 3.5–5.1)
Potassium: 4.1 mmol/L (ref 3.5–5.1)
Sodium: 151 mmol/L — ABNORMAL HIGH (ref 135–145)
Sodium: 150 mmol/L — ABNORMAL HIGH (ref 135–145)

## 2021-06-02 LAB — PREPARE RBC (CROSSMATCH)

## 2021-06-02 LAB — CBC
HCT: 27.3 % — ABNORMAL LOW (ref 39.0–52.0)
HCT: 29.8 % — ABNORMAL LOW (ref 39.0–52.0)
Hemoglobin: 8.8 g/dL — ABNORMAL LOW (ref 13.0–17.0)
Hemoglobin: 9.8 g/dL — ABNORMAL LOW (ref 13.0–17.0)
MCH: 29.5 pg (ref 26.0–34.0)
MCH: 29.3 pg (ref 26.0–34.0)
MCHC: 32.2 g/dL (ref 30.0–36.0)
MCHC: 32.9 g/dL (ref 30.0–36.0)
MCV: 91.6 fL (ref 80.0–100.0)
MCV: 89.2 fL (ref 80.0–100.0)
Platelets: 167 10*3/uL (ref 150–400)
Platelets: 135 10*3/uL — ABNORMAL LOW (ref 150–400)
RBC: 2.98 MIL/uL — ABNORMAL LOW (ref 4.22–5.81)
RBC: 3.34 MIL/uL — ABNORMAL LOW (ref 4.22–5.81)
RDW: 17.1 % — ABNORMAL HIGH (ref 11.5–15.5)
RDW: 16.4 % — ABNORMAL HIGH (ref 11.5–15.5)
WBC: 14.8 10*3/uL — ABNORMAL HIGH (ref 4.0–10.5)
WBC: 12 10*3/uL — ABNORMAL HIGH (ref 4.0–10.5)
nRBC: 0.2 % (ref 0.0–0.2)
nRBC: 0 % (ref 0.0–0.2)

## 2021-06-02 LAB — GLUCOSE, CAPILLARY
Glucose-Capillary: 125 mg/dL — ABNORMAL HIGH (ref 70–99)
Glucose-Capillary: 128 mg/dL — ABNORMAL HIGH (ref 70–99)
Glucose-Capillary: 128 mg/dL — ABNORMAL HIGH (ref 70–99)
Glucose-Capillary: 137 mg/dL — ABNORMAL HIGH (ref 70–99)
Glucose-Capillary: 144 mg/dL — ABNORMAL HIGH (ref 70–99)
Glucose-Capillary: 157 mg/dL — ABNORMAL HIGH (ref 70–99)
Glucose-Capillary: 161 mg/dL — ABNORMAL HIGH (ref 70–99)

## 2021-06-02 LAB — LACTATE DEHYDROGENASE: LDH: 329 U/L — ABNORMAL HIGH (ref 98–192)

## 2021-06-02 LAB — MAGNESIUM: Magnesium: 2.4 mg/dL (ref 1.7–2.4)

## 2021-06-02 LAB — HEPATIC FUNCTION PANEL
ALT: 161 U/L — ABNORMAL HIGH (ref 0–44)
AST: 98 U/L — ABNORMAL HIGH (ref 15–41)
Albumin: 2.6 g/dL — ABNORMAL LOW (ref 3.5–5.0)
Alkaline Phosphatase: 137 U/L — ABNORMAL HIGH (ref 38–126)
Bilirubin, Direct: 0.3 mg/dL — ABNORMAL HIGH (ref 0.0–0.2)
Indirect Bilirubin: 0.7 mg/dL (ref 0.3–0.9)
Total Bilirubin: 1 mg/dL (ref 0.3–1.2)
Total Protein: 4.2 g/dL — ABNORMAL LOW (ref 6.5–8.1)

## 2021-06-02 LAB — APTT
aPTT: 60 seconds — ABNORMAL HIGH (ref 24–36)
aPTT: 59 seconds — ABNORMAL HIGH (ref 24–36)

## 2021-06-02 LAB — BPAM RBC
Blood Product Expiration Date: 202303132359
Unit Type and Rh: 5100

## 2021-06-02 LAB — LACTIC ACID, PLASMA: Lactic Acid, Venous: 1.3 mmol/L (ref 0.5–1.9)

## 2021-06-02 LAB — FIBRINOGEN: Fibrinogen: 323 mg/dL (ref 210–475)

## 2021-06-02 LAB — PROTIME-INR
INR: 2.3 — ABNORMAL HIGH (ref 0.8–1.2)
Prothrombin Time: 25.2 seconds — ABNORMAL HIGH (ref 11.4–15.2)

## 2021-06-02 MED ORDER — FENTANYL CITRATE PF 50 MCG/ML IJ SOSY
25.0000 ug | PREFILLED_SYRINGE | INTRAMUSCULAR | Status: DC | PRN
  Administered 2021-06-02 – 2021-06-13 (×10): 50 ug via INTRAVENOUS
  Administered 2021-06-15: 25 ug via INTRAVENOUS
  Administered 2021-06-15 – 2021-06-16 (×2): 50 ug via INTRAVENOUS
  Administered 2021-06-16 (×2): 25 ug via INTRAVENOUS
  Administered 2021-06-16: 100 ug via INTRAVENOUS
  Filled 2021-06-02 (×9): qty 1
  Filled 2021-06-02: qty 2
  Filled 2021-06-02: qty 1

## 2021-06-02 MED ORDER — SODIUM CHLORIDE 0.9 % IV SOLN: INTRAVENOUS | Status: DC | PRN

## 2021-06-02 MED ORDER — FENTANYL BOLUS VIA INFUSION
25.0000 ug | INTRAVENOUS | Status: DC | PRN
  Administered 2021-06-02 (×2): 100 ug via INTRAVENOUS
  Administered 2021-06-03 (×3): 50 ug via INTRAVENOUS
  Administered 2021-06-03 (×2): 100 ug via INTRAVENOUS
  Administered 2021-06-03: 50 ug via INTRAVENOUS
  Administered 2021-06-03: 100 ug via INTRAVENOUS
  Administered 2021-06-04: 50 ug via INTRAVENOUS
  Administered 2021-06-04: 75 ug via INTRAVENOUS
  Administered 2021-06-04: 50 ug via INTRAVENOUS
  Administered 2021-06-05: 25 ug via INTRAVENOUS
  Administered 2021-06-05: 50 ug via INTRAVENOUS
  Administered 2021-06-05 (×2): 25 ug via INTRAVENOUS
  Administered 2021-06-05: 50 ug via INTRAVENOUS
  Administered 2021-06-06: 100 ug via INTRAVENOUS
  Administered 2021-06-06: 25 ug via INTRAVENOUS
  Administered 2021-06-06: 50 ug via INTRAVENOUS
  Administered 2021-06-07 – 2021-06-10 (×13): 100 ug via INTRAVENOUS
  Administered 2021-06-11: 50 ug via INTRAVENOUS
  Administered 2021-06-11: 25 ug via INTRAVENOUS
  Administered 2021-06-11 – 2021-06-12 (×3): 50 ug via INTRAVENOUS
  Administered 2021-06-12: 25 ug via INTRAVENOUS
  Filled 2021-06-02: qty 100

## 2021-06-02 MED ORDER — POLYETHYLENE GLYCOL 3350 17 G PO PACK
17.0000 g | PACK | Freq: Every day | ORAL | Status: DC
  Administered 2021-06-02: 17 g
  Filled 2021-06-02 (×2): qty 1

## 2021-06-02 MED ORDER — FENTANYL CITRATE PF 50 MCG/ML IJ SOSY: 25.0000 ug | PREFILLED_SYRINGE | Freq: Once | INTRAMUSCULAR | Status: DC

## 2021-06-02 MED ORDER — FUROSEMIDE 10 MG/ML IJ SOLN
40.0000 mg | Freq: Once | INTRAMUSCULAR | Status: AC
  Administered 2021-06-02: 40 mg via INTRAVENOUS
  Filled 2021-06-02: qty 4

## 2021-06-02 MED ORDER — FENTANYL CITRATE PF 50 MCG/ML IJ SOSY
25.0000 ug | PREFILLED_SYRINGE | INTRAMUSCULAR | Status: AC | PRN
  Administered 2021-06-13 – 2021-06-14 (×3): 25 ug via INTRAVENOUS
  Filled 2021-06-02 (×3): qty 1

## 2021-06-02 MED ORDER — SODIUM CHLORIDE 0.9% IV SOLUTION
Freq: Once | INTRAVENOUS | Status: AC
Start: 2021-06-02 — End: 2021-06-02

## 2021-06-02 MED ORDER — FENTANYL 2500MCG IN NS 250ML (10MCG/ML) PREMIX INFUSION
0.0000 ug/h | INTRAVENOUS | Status: DC
  Administered 2021-06-02: 25 ug/h via INTRAVENOUS
  Administered 2021-06-03 (×2): 100 ug/h via INTRAVENOUS
  Administered 2021-06-04: 175 ug/h via INTRAVENOUS
  Administered 2021-06-05: 75 ug/h via INTRAVENOUS
  Administered 2021-06-07: 100 ug/h via INTRAVENOUS
  Administered 2021-06-08 – 2021-06-09 (×2): 25 ug/h via INTRAVENOUS
  Filled 2021-06-02 (×9): qty 250

## 2021-06-02 MED ORDER — CLONAZEPAM 0.25 MG PO TBDP
0.2500 mg | ORAL_TABLET | Freq: Every day | ORAL | Status: DC
  Administered 2021-06-02: 0.25 mg
  Filled 2021-06-02: qty 1

## 2021-06-02 MED ORDER — DOCUSATE SODIUM 50 MG/5ML PO LIQD
100.0000 mg | Freq: Two times a day (BID) | ORAL | Status: DC
  Administered 2021-06-02 (×2): 100 mg
  Filled 2021-06-02 (×3): qty 10

## 2021-06-02 NOTE — Procedures (Signed)
Tracheostomy Exchange Procedure Note  Randall Hodges  [id number]  1950/10/24  Date:06/02/21  Time:3:54 PM   Provider Performing:Pete E Kary Kos   Procedure: Tracheostomy Exchange Through Immature Stoma [phone])  Indication(s) Air leak   Consent Risks of the procedure as well as the alternatives and risks of each were explained to the patient and/or caregiver.  Consent for the procedure was obtained and is signed in the bedside chart  Anesthesia None   Time Out Verified patient identification, verified procedure, site/side was marked, verified correct patient position, special equipment/implants available, medications/allergies/relevant history reviewed, required imaging and test results available.   Sterile Technique Hand hygiene, gloves   Procedure Description Size 6 cuffed existing Shiley removed and size 6 distal XLT cuffed Shiley placed through stoma.  Placement verified via FOB. About 2 cm above carina  Complications/Tolerance None; patient tolerated the procedure well..   EBL Minimal  Erick Colace ACNP-BC [place] Pager # [phone] OR # [phone] if no answer

## 2021-06-02 NOTE — Progress Notes (Signed)
ECMO Circuit Change  During multidisciplinary rounds, the decision was made by Dr. Lynetta Mare, Dr. Aundra Dubin, and the care team to change out the ECMO circuit. A new circuit was set up and primed using sterile technique and brought to the patient bedside by Damarys Speir, RN/ES and [place], RT/ES. The following staff were present for the procedure:  Donnetta Simpers, RN - ECMO Coordinator, Thedore Mins, RN/ES, [place], [place], [place], RN, [place], RN, [place], [place], [place], PharmD  The field was prepped and draped, a timeout was performed, and the team completed a briefing prior to the start of the procedure. 3000 units of Heparin were given per pharmacy. The ECMO circuit was clamped out at 0921, cut and reconnected to the new circuit via a wet-to-wet connection. Flow was reinitiated at 0921 and 2650 rpms were resumed for a flow of 3.5 LPM.  Disposable Lot Numbers:   Oxygenator Lot #:  [id number] Tubing Lot #:  [id number] Cardiohelp Console #:  [id number]

## 2021-06-02 NOTE — Progress Notes (Signed)
Patient ID: Randall Hodges, male   DOB: 06-20-1950, 71 y.o.   MRN: [id number] Extracorporeal support note  ECLS support day: 11 Indication: ARDS  Configuration: VV  Drainage cannula: Crescent R IJ Return cannula: Crescent R IJ  Pump speed: 2850 rpm Pump flow: 3.7 L/min Pump used: Cardiohelp  Sweep gas: 4.5    Circuit check: Small amount thrombus. Good color change. LDH stable 329 Anticoagulant: Bivalirudin, PTT goal 50-80. PTT 60  Changes in support: Changing oxygenator today  Anticipated goals/duration of support: Wean to discontinuation  Loralie Champagne, MD  7:56 AM

## 2021-06-02 NOTE — Progress Notes (Signed)
EVENING ROUNDS NOTE :     [address]       [place] [address]             [id number]                 12 Days Post-Op Procedure(s) (LRB): ECMO CANNULATION (Bilateral)   Total Length of Stay:  LOS: 28 days  Events:   Trach and oxygenator exchanged this afternoon Remains stable    BP (!) 111/56    Pulse 73    Temp 98 F (36.7 C) (Axillary)    Resp 20    Ht 5\' 10"  (1.778 m)    Wt 85.6 kg    SpO2 100%    BMI 27.08 kg/m   CVP:  [1 mmHg-19 mmHg] 10 mmHg  Vent Mode: PCV FiO2 (%):  [50 %-60 %] 60 % Set Rate:  [2 bmp-22 bmp] 22 bmp PEEP:  [10 cmH20] 10 cmH20 Plateau Pressure:  [10 cmH20-25 cmH20] 10 cmH20   sodium chloride Stopped (06/01/21 1121)   albumin human 20 mL/hr at 06/02/21 2000   albumin human Stopped (05/30/21 0800)   bivalirudin (ANGIOMAX) infusion 0.5 mg/mL (Non-ACS indications) 0.065 mg/kg/hr (06/02/21 2000)   dexmedetomidine (PRECEDEX) IV infusion 0.8 mcg/kg/hr (06/02/21 1901)   feeding supplement (VITAL 1.5 CAL) 60 mL/hr at 06/02/21 1900   fentaNYL infusion INTRAVENOUS 100 mcg/hr (06/02/21 2000)   norepinephrine (LEVOPHED) Adult infusion Stopped (06/02/21 1835)    I/O last 3 completed shifts: In: 4607.3 [P.O.:19; I.V.:767.5; Blood:1345; Other:60; AS/TM:1962; IV Piggyback:220.7] Out: [place]ict [Urine:5385; Emesis/NG output:410; IWLNL:8921]   CBC Latest Ref Rng & Units 06/02/2021 06/02/2021 06/02/2021  WBC 4.0 - 10.5 K/uL - 12.0(H) -  Hemoglobin 13.0 - 17.0 g/dL 9.9(L) 9.8(L) 10.2(L)  Hematocrit 39.0 - 52.0 % 29.0(L) 29.8(L) 30.0(L)  Platelets 150 - 400 K/uL - 135(L) -    BMP Latest Ref Rng & Units 06/02/2021 06/02/2021 06/02/2021  Glucose 70 - 99 mg/dL - 132(H) -  BUN 8 - 23 mg/dL - 80(H) -  Creatinine 0.61 - 1.24 mg/dL - 1.28(H) -  BUN/Creat Ratio 10 - 24 - - -  Sodium 135 - 145 mmol/L 152(H) 150(H) 152(H)  Potassium 3.5 - 5.1 mmol/L 4.4 4.1 4.2  Chloride 98 - 111 mmol/L - 115(H) -  CO2 22 - 32 mmol/L - 28 -  Calcium 8.9 - 10.3 mg/dL -  7.8(L) -    ABG    Component Value Date/Time   PHART 7.406 06/02/2021 1949   PCO2ART 47.1 06/02/2021 1949   PO2ART 86 06/02/2021 1949   HCO3 29.7 (H) 06/02/2021 1949   TCO2 31 06/02/2021 1949   ACIDBASEDEF 5.0 (H) 06/01/2021 1156   O2SAT 97 06/02/2021 1949       Melodie Bouillon, MD 06/02/2021 8:39 PM

## 2021-06-02 NOTE — Progress Notes (Signed)
Patient ID: Randall Hodges, male   DOB: April 22, 1950, 71 y.o.   MRN: [id number]    Progress Note  Patient Name: Randall Hodges Date of Encounter: 06/02/2021  [facility] Cardiologist: [name], MD   Subjective   2/2: VV ECMO initiation, Crescent catheter right IJ 2/7: Tracheostomy 2/13 CT C/A/P: Diffuse ground-glass opacities consistent with atelectasis and edema presumably related to ECMO physiology and respiratory failure.  Underlying moderate interstitial lung disease mildly progressed from prior CT.  Sedated this morning on Precedex.   Amio gtt stopped 2/11 by CCM. Remains in NSR  CXR stable bilateral diffuse disease, L>R. Afebrile, not on abx.     LDH 615 -> 380 ->-> -> 345 -> 301 -> 321 -> 463 -> 329  Vent FiO2 0.5. CVP 8 this morning, had Lasix 60 mg IV x 2 yesterday with good diuresis (net negative 2000 cc).  Creatinine stable 1.25.   ECMO: Speed 2850 rpm Flow 3.7 L/min pVen -56 DeltaP 15 Sweep 4.5 PTT 60 (goal 50-80) LDH 329 ABG 7.34/56/67/91% Lactate 1.3  Pre and post oxygenator gases showed appropriate oxygenation but not clearance of CO2.   Echo today: There did appear to be appropriate ECMO flow towards the tricuspid valve.   Cardiac Studies: Echo (limited, 1/30): Echo reviewed, there is clear respirophasic variation of the interventricular septum and marked respirophasic variation of E inflow velocity on doppler evaluation of the mitral valve.  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  LV EF 45-50%.   RHC Procedural Findings (on norepinephrine 6): Hemodynamics (mmHg) RA mean 12 RV 37/12 PA 38/16, mean 27 PCWP mean 11 LV 108/12 AO 96/55 PAPI 1.8 Oxygen saturations: PA 54% AO 94% Cardiac Output (Fick) 5.59  Cardiac Index (Fick) 2.81  PVR 2.8  WU Simultaneous RV/LV tracings were obtained.  Difficult to interpret due to atrial fibrillation.  There was some suggestion of discordance (ventricular interdependence) but not clear.  Inpatient Medications    Scheduled Meds:  sodium chloride   Intravenous Once   aspirin  81 mg Per Tube Daily   baclofen  5 mg Per Tube TID   chlorhexidine gluconate (MEDLINE KIT)  15 mL Mouth Rinse BID   Chlorhexidine Gluconate Cloth  6 each Topical Daily   colchicine  0.6 mg Per Tube Daily   docusate  100 mg Per Tube BID   feeding supplement (PROSource TF)  90 mL Per Tube BID   fentaNYL (SUBLIMAZE) injection  25 mcg Intravenous Once   fiber  1 packet Per Tube TID   free water  200 mL Per Tube Q2H   gabapentin  300 mg Per Tube QHS   Gerhardt's butt cream   Topical TID   guaiFENesin  30 mL Per Tube QID   heparin  3,000 Units Intravenous Once   insulin aspart  0-15 Units Subcutaneous Q4H   mouth rinse  15 mL Mouth Rinse 10 times per day   melatonin  5 mg Per Tube QHS   methylPREDNISolone (SOLU-MEDROL) injection  40 mg Intravenous Q24H   Followed by   Derrill Memo ON 06/05/2021] methylPREDNISolone (SOLU-MEDROL) injection  20 mg Intravenous Q24H   midazolam  2 mg Intravenous Once   pantoprazole sodium  40 mg Per Tube BID   polyethylene glycol  17 g Per Tube Daily   rosuvastatin  10 mg Per Tube Daily   sodium chloride flush  10-40 mL Intracatheter Q12H   sodium chloride flush  10-40 mL Intracatheter Q12H   sodium chloride flush  3 mL Intravenous Q12H   Continuous Infusions:  sodium chloride Stopped (06/01/21 1121)   albumin human Stopped (06/01/21 2119)   albumin human Stopped (06/01/21 1822)   albumin human Stopped (05/30/21 0800)   bivalirudin (ANGIOMAX) infusion 0.5 mg/mL (Non-ACS indications) 0.065 mg/kg/hr (06/02/21 0700)   dexmedetomidine (PRECEDEX) IV infusion 1.2 mcg/kg/hr (06/02/21 0722)   feeding supplement (VITAL 1.5 CAL) 1,000 mL (06/02/21 0029)   fentaNYL infusion INTRAVENOUS      norepinephrine (LEVOPHED) Adult infusion Stopped (06/02/21 0536)   PRN Meds: Place/Maintain arterial line **AND** sodium chloride, acetaminophen (TYLENOL) oral liquid 160 mg/5 mL, albumin human, albumin human, docusate, fentaNYL, fentaNYL (SUBLIMAZE) injection, fentaNYL (SUBLIMAZE) injection, levalbuterol, ondansetron (ZOFRAN) IV, oxyCODONE, polyvinyl alcohol, sennosides, sodium chloride flush   Vital Signs    Vitals:   06/02/21 0615 06/02/21 0630 06/02/21 0645 06/02/21 0700  BP:  (!) 158/67 (!) 165/68 (!) 145/69  Pulse: 74 75 72 77  Resp: (!) 39 (!) 30 (!) 39 (!) 36  Temp:      TempSrc:      SpO2: 94% 93% 93% 93%  Weight:      Height:        Intake/Output Summary (Last 24 hours) at 06/02/2021 0757 Last data filed at 06/02/2021 0700 Gross per 24 hour  Intake 2670.32 ml  Output 4870 ml  Net -2199.68 ml   Last 3 Weights 06/02/2021 06/01/2021 05/31/2021  Weight (lbs) 188 lb 11.4 oz 189 lb 9.5 oz 186 lb 1.1 oz  Weight (kg) 85.6 kg 86 kg 84.4 kg      Telemetry    NSR 70-80s (personally reviewed)  Physical Exam   General: Sedated on vent.  Neck: RIJ Crescent cannula, no thyromegaly or thyroid nodule.  Lungs: Clear to auscultation bilaterally with normal respiratory effort. CV: Nondisplaced PMI.  Heart regular S1/S2, no S3/S4, no murmur.  No peripheral edema.   Abdomen: Soft,  no hepatosplenomegaly, no distention.  Skin: Intact without lesions or rashes.  Neurologic: Sedated.  Extremities: No clubbing or cyanosis.  HEENT: Normal.   Labs    High Sensitivity Troponin:   Recent Labs  Lab 05/11/2021 1321 05/17/2021 1518  TROPONINIHS 3 4     Chemistry Recent Labs  Lab 05/27/21 0350 05/27/21 0546 05/31/21 0330 05/31/21 1015 06/01/21 0350 06/01/21 0353 06/01/21 1804 06/01/21 1951 06/01/21 2203 06/02/21 0004 06/02/21 0345 06/02/21 0539 06/02/21 0543 06/02/21 0627 06/02/21 0659  NA 150*   < > 149*   < > 150*   < > 151*   < > 151*   153*   < > 151*   < > 151* 151* 152*   K 4.7   < > 4.2   < > 4.6   < > 4.1   < > 4.2   4.2   < > 4.3   < > 4.2 4.4 4.5  CL 114*   < > 111   < > 113*  --  114*  --  116*  --  115*  --   --   --   --   CO2 31   < > 32   < > 30  --  28  --  24  --  30  --   --   --   --   GLUCOSE 185*   < > 174*   < > 168*  --  181*  --  187*  --  148*  --   --   --   --   BUN 55*   < > 78*   < > 87*  --  84*  --  85*  --  85*  --   --   --   --   CREATININE 1.02   < > 1.17   < > 1.29*  --  1.28*  --  1.20  --  1.25*  --   --   --   --   CALCIUM 7.9*   < > 8.0*   < > 8.0*  --  7.9*  --  7.8*  --  7.9*  --   --   --   --   MG 2.3  --   --   --  2.4  --   --   --   --   --  2.4  --   --   --   --   PROT 4.2*   < > 4.2*  --  4.2*  --   --   --   --   --  4.2*  --   --   --   --   ALBUMIN 2.0*   < > 2.4*  --  2.4*  --   --   --   --   --  2.6*  --   --   --   --   AST 86*   < > 43*  --  233*  --   --   --   --   --  98*  --   --   --   --   ALT 306*   < > 83*  --  179*  --   --   --   --   --  161*  --   --   --   --   ALKPHOS 185*   < > 116  --  177*  --   --   --   --   --  137*  --   --   --   --   BILITOT 1.3*   < > 1.2  --  1.6*  --   --   --   --   --  1.0  --   --   --   --   GFRNONAA >60   < > >60   < > 59*  --  60*  --  >60  --  >60  --   --   --   --   ANIONGAP 5   < > 6   < > 7  --  9  --  11  --  6  --   --   --   --    < > = values in this interval not displayed.    Lipids  No results for input(s): CHOL, TRIG, HDL, LABVLDL, LDLCALC, CHOLHDL in the last 168 hours.   Hematology Recent Labs  Lab 06/01/21 0350 06/01/21 0353 06/01/21 1804 06/01/21 1951 06/02/21 0345 06/02/21 0539 06/02/21 0543 06/02/21 0627 06/02/21 0659  WBC 15.1*  --  14.4*  --  14.8*  --   --   --   --   RBC 2.52*  --  3.30*  --  2.98*  --   --   --   --   HGB 7.2*   < > 9.4*   < > 8.8*   < >  9.2* 9.2* 9.5*  HCT 23.8*   < > 29.9*   < > 27.3*   < > 27.0* 27.0* 28.0*  MCV 94.4  --  90.6  --  91.6  --   --   --   --   MCH 28.6  --  28.5  --  29.5  --   --   --    --   MCHC 30.3  --  31.4  --  32.2  --   --   --   --   RDW 17.5*  --  16.7*  --  17.1*  --   --   --   --   PLT 182  --  171  --  167  --   --   --   --    < > = values in this interval not displayed.   Thyroid No results for input(s): TSH, FREET4 in the last 168 hours.  BNPNo results for input(s): BNP, PROBNP in the last 168 hours.  DDimer No results for input(s): DDIMER in the last 168 hours.   Radiology    DG CHEST PORT 1 VIEW  Result Date: 06/01/2021 CLINICAL DATA:  ARDS EXAM: PORTABLE CHEST 1 VIEW COMPARISON:  Chest radiograph from one day prior. FINDINGS: Tracheostomy tube tip overlies the tracheal air column at the thoracic inlet. Enteric tube enters stomach with the tip not seen on this image. Right chest ECMO catheter in place. Right PICC terminates over the cavoatrial junction. Stable cardiomediastinal silhouette with mild cardiomegaly. No pneumothorax. Stable biapical pleural thickening. No definite pleural effusions. Extensive patchy opacities throughout both lungs, not appreciably changed accounting for overall slightly lower lung volumes. IMPRESSION: 1. Well-positioned support structures. 2. Extensive patchy opacities throughout both lungs, not appreciably changed accounting for overall slightly lower lung volumes, compatible with reported ARDS. Electronically Signed   By: Ilona Sorrel M.D.   On: 06/01/2021 08:03    Cardiac Studies   Cath 05/04/2021 Distal left main [place] 111 bifurcation stenosis with 75% left main, 90% ostial to proximal LAD, and 80-90% ostial circumflex (difficult to assess due to heavy calcification). Severe mid circumflex disease with 70% eccentric mid stenosis and second obtuse marginal containing ostial to proximal greater than 80% stenosis.  (Bifurcation [place] 111 Severe calcification in left main and LAD in particular with diffuse 50% narrowing from proximal to mid vessel and tandem 70% stenoses in the mid LAD. Nondominant right coronary Normal LV  function.  EF 55%.  LVEDP normal.    Patient Profile     71 y.o. male with PMH of PVCs presented with chest pain. Cardiac cath by Dr. Tamala Julian on 05/02/2021 showed 75% left main, 90% ost to prox LAD, 80-90% ost LCx, 70% mid LCx, 80% OM2, 50% prox to mid LAD, 70% mid LAD, EF 55%. Patient underwent CABG x 3 on 05/01/2021. Post op course complicated afib, treated with amio. CXR showed opacity in bilateral lung, started abx on 1/25 and diuretic. Started on Eliquis due to recurrence of afib.    Assessment & Plan    1. CAD: Admitted with unstable angina, cath with severe left main and proximal LAD/LCx disease (nondominant RCA).  CABG x 3 on 1/18 with LIMA-LAD, SVG-OM, SVG-left PDA.  No s/s angina  - Continue ASA 81, statin.  2. 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 RHC 1/30 with equalization of diastolic pressures.  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  Repeated echo 2/2 still showed respirophasic septal variation but not as impressive. CVP 8 today.  Lungs still with diffuse bilateral infiltrates on CXR. Good diuresis yesterday.  - Give Lasix 40 mg IV x 1 today with albumen.  - With concern for development of post-surgical effusive/constrictive pericarditis, he is on colchicine.  - ?need for surgical intervention on effusive/constrictive pericarditis. Will need improvement of lung disease first then reassess, repeat echo not as impressive.   3. 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  Suspect primarily septic/vasodilatory shock. He is on NE 4  today.  Increased requirement may be due at least in part to sedation.  - Stop amlodipine and Coreg for now.  - Wean NE as able.  - Will reculture blood/sputum with pressor requirement this morning.  4. Hiccups: Still present.  - On baclofen.  - Gabapentin started 5. PNA with acute hypoxic respiratory failure: Of note, he does appear to have had some pre-existing ILD from 2019 CT chest (?sarcoidosis). CXR with persistent bilateral infiltrates, possible mild improvement compared to yesterday.  Have thought most likely aspiration PNA/pneumonitis in setting of intractable hiccups. He has developred ARDS.  Afebrile. COVID was negative.  FiO2 0.4 on vent.  We have struggled to wean sweep, now back to 5. CXR similar with bilateral diffuse airspace disease. Has tracheostomy. CT chest 2/9 with persistent lung infiltrates.  LDH lower today.  Still requiring sweep 4.5.  Pre- and post-oxygenator gases suggest appropriate oxygenation but not clearing CO2.  TTE suggests position of ECMO cannula ok.  - Exchange oxygenator today.   - Discussed with pulmonary, think amiodarone toxicity unlikely as he has only been on it post-op. However they have asked to stop for now.  - Completed vancomycin and meropenem => reculturing blood and sputum today.   - Vent and sedation per CCM.  - Solumedrol 40 mg IV daily with gradual taper, ESR now normal.  6. Atypical atrial flutter/PVCs:  Remains in NSR - Off amiodarone now.  - Bivalirudin gtt, goal PTT 50-80. Discussed dosing with PharmD personally. 7. AKI: Creatinine stable 1.25.  Follow closely.  8. Elevated LFTs: Suspect shock liver, follow CMET.  LFTs trending down.  9. Anemia: He has had bleeding at trach site.  Hgb 8.8 after transfusion yesterday.    10. Hypernatremia: Na 151 today.  - Increase free water to 250 q2.  11. HTN: Back on NE, stop amlodipine/Coreg.    CRITICAL CARE Performed by: Loralie Champagne  Total critical care time: 45 minutes  Critical care time  was exclusive of separately billable procedures and treating other patients.  Critical care was necessary to treat or prevent imminent or life-threatening deterioration.  Critical care was time spent personally by me on the following activities: development of treatment plan with patient and/or surrogate as well as nursing, discussions with consultants, evaluation of patient's response to treatment, examination of patient, obtaining history from patient or surrogate, ordering and performing treatments and interventions, ordering and review of laboratory studies, ordering and review of radiographic studies, pulse oximetry and re-evaluation of patient's condition.  Loralie Champagne MD 06/02/2021 7:57 AM

## 2021-06-02 NOTE — Progress Notes (Signed)
Physical Therapy Treatment Patient Details Name: Randall Hodges MRN: [id number] DOB: 1950/07/02 Today's Date: 06/02/2021   History of Present Illness 71 yo male with CAD who initially presented with CP, noted to have multivessel CAD, he underwent CABG x3 on 04/24/2021, course was complicated with atrial fibrillation, frequent hiccups and aspiration PNA leading to hypoxia and increasing oxygen requirement. ETT 1/18-1/18. 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 Cortrak 1/30. 2/2 placed on VV ECMO. 2/7 trach. 2/14 ECMO circuit replacement. PMH: prostate CA    PT Comments    Pt with ECMO circuit replacement this AM. Upon entry, pt alert and blinks to threat. Nods yes or no to questions intermittently, otherwise, no command following. Of note, pt with increased agitation with passive ROM and RN increased sedation during treatment, likely impacting level of participation. Pt tolerated 20 minutes of tilt 40-45 degrees. Will continue to progress as tolerated.   Recommendations for follow up therapy are one component of a multi-disciplinary discharge planning process, led by the attending physician.  Recommendations may be updated based on patient status, additional functional criteria and insurance authorization.  Follow Up Recommendations  Acute inpatient rehab (3hours/day)     Assistance Recommended at Discharge Frequent or constant Supervision/Assistance  Patient can return home with the following     Equipment Recommendations  BSC/3in1;Wheelchair (measurements PT);Wheelchair cushion (measurements PT);Hospital bed;Other (comment) (hoyer lift)    Recommendations for Other Services       Precautions / Restrictions Precautions Precautions: Fall;Sternal Restrictions Other Position/Activity Restrictions: sternal precautions     Mobility  Bed Mobility Overal bed mobility: Needs Assistance             General bed mobility comments: Kreg bed tilt 40-45 degrees, 1340-1400     Transfers                        Ambulation/Gait                   Stairs             Wheelchair Mobility    Modified Rankin (Stroke Patients Only)       Balance                                            Cognition Arousal/Alertness: Awake/alert Behavior During Therapy: Flat affect Overall Cognitive Status: Difficult to assess                                 General Comments: Pt nodding "yes," or "no" to several questions today, blinks to command intermittently, but otherwise does not tollow commands this session. Likely also impacted by sedation        Exercises General Exercises - Upper Extremity Shoulder Flexion: PROM, Both, 10 reps, Other (comment) (standing in kreg bed) Elbow Flexion: PROM, Both, 10 reps, Other (comment) (standing in kreg bed) Elbow Extension: PROM, Both, 10 reps, Other (comment) (standing in kreg bed) Other Exercises Other Exercises: bilat knee extension in tilt with repeated tactile and verbal cues, but no purposeful activation noted by pt    General Comments  SpO2 96% on 60% FiO2 10 PEEP, 70 HR, BP rest 884/16 (63), systolic rise to 606'T with agitation prior to Fent bolus      Pertinent Vitals/Pain Pain Assessment Pain  Assessment: Faces Faces Pain Scale: Hurts a little bit Pain Location: pt grimacing with left knee passive flexion; nods "yes," to when asks if it hurts Pain Descriptors / Indicators: Grimacing Pain Intervention(s): Monitored during session    Home Living                          Prior Function            PT Goals (current goals can now be found in the care plan section) Acute Rehab PT Goals Patient Stated Goal: pt wife would like pt to work with therapy Potential to Achieve Goals: Fair    Frequency    Min 3X/week      PT Plan Current plan remains appropriate    Co-evaluation PT/OT/SLP Co-Evaluation/Treatment: Yes Reason for  Co-Treatment: Complexity of the patient's impairments (multi-system involvement);Necessary to address cognition/behavior during functional activity;For patient/therapist safety;To address functional/ADL transfers PT goals addressed during session: Mobility/safety with mobility        AM-PAC PT "6 Clicks" Mobility   Outcome Measure  Help needed turning from your back to your side while in a flat bed without using bedrails?: Total Help needed moving from lying on your back to sitting on the side of a flat bed without using bedrails?: Total Help needed moving to and from a bed to a chair (including a wheelchair)?: Total Help needed standing up from a chair using your arms (e.g., wheelchair or bedside chair)?: Total Help needed to walk in hospital room?: Total Help needed climbing 3-5 steps with a railing? : Total 6 Click Score: 6    End of Session Equipment Utilized During Treatment: Oxygen Activity Tolerance: Other (comment) (limited by impaired cognition and sedation) Patient left: in bed;with call bell/phone within reach Nurse Communication: Mobility status PT Visit Diagnosis: Muscle weakness (generalized) (M62.81);Other abnormalities of gait and mobility (R26.89);Difficulty in walking, not elsewhere classified (R26.2);Unsteadiness on feet (R26.81)     Time: 1323-1410 PT Time Calculation (min) (ACUTE ONLY): 47 min  Charges:  $Therapeutic Activity: 23-37 mins                     Wyona Almas, PT, DPT Acute Rehabilitation Services Pager [phone] Office [phone]    Deno Etienne 06/02/2021, 4:23 PM

## 2021-06-02 NOTE — Progress Notes (Signed)
12 Days Post-Op Procedure(s) (LRB): ECMO CANNULATION (Bilateral) Subjective: Patient examined and images of recent echocardiogram and chest x-ray reviewed. Patient underwent ECMO circuit replacement today in an attempt to improve CO2 exchange. Chest x-ray shows stable ARDS changes, no worsening Echocardiogram shows EF 45% with improvement of small postoperative pericardial effusion ,no evidence of tamponade Appreciate the exceptional care of the patient by ECMO team  Objective: Vital signs in last 24 hours: Temp:  [97.4 F (36.3 C)-99 F (37.2 C)] 98 F (36.7 C) (02/14 1200) Pulse Rate:  [59-88] 72 (02/14 1200) Cardiac Rhythm: Normal sinus rhythm (02/14 0800) Resp:  [0-50] 22 (02/14 1200) BP: (102-174)/(49-88) 133/55 (02/14 1200) SpO2:  [89 %-100 %] 96 % (02/14 1200) Arterial Line BP: (118-309)/(-21-69) 150/49 (02/14 1200) FiO2 (%):  [40 %-60 %] 60 % (02/14 1119) Weight:  [85.6 kg] 85.6 kg (02/14 0348)  Hemodynamic parameters for last 24 hours: CVP:  [1 mmHg-35 mmHg] 11 mmHg  Intake/Output from previous day: 02/13 0701 - 02/14 0700 In: 2670.3 [P.O.:19; I.V.:383; Blood:633.3; NG/GT:1475; IV Piggyback:100] Out: 1497 [Urine:3360; Emesis/NG output:160; WYOVZ:8588] Intake/Output this shift: Total I/O In: 1043.1 [I.V.:151.4; Blood:711.7; NG/GT:180] Out: 785 [Urine:535; Emesis/NG output:250]    Lab Results: Recent Labs    06/01/21 1804 06/01/21 1951 06/02/21 0345 06/02/21 0539 06/02/21 0627 06/02/21 0659  WBC 14.4*  --  14.8*  --   --   --   HGB 9.4*   < > 8.8*   < > 9.2* 9.5*  HCT 29.9*   < > 27.3*   < > 27.0* 28.0*  PLT 171  --  167  --   --   --    < > = values in this interval not displayed.   BMET:  Recent Labs    06/01/21 2203 06/02/21 0004 06/02/21 0345 06/02/21 0539 06/02/21 0627 06/02/21 0659  NA 151*   153*   < > 151*   < > 151* 152*  K 4.2   4.2   < > 4.3   < > 4.4 4.5  CL 116*  --  115*  --   --   --   CO2 24  --  30  --   --   --   GLUCOSE 187*   --  148*  --   --   --   BUN 85*  --  85*  --   --   --   CREATININE 1.20  --  1.25*  --   --   --   CALCIUM 7.8*  --  7.9*  --   --   --    < > = values in this interval not displayed.    PT/INR:  Recent Labs    06/02/21 0345  LABPROT 25.2*  INR 2.3*   ABG    Component Value Date/Time   PHART 7.339 (L) 06/02/2021 0659   HCO3 30.1 (H) 06/02/2021 0659   TCO2 32 06/02/2021 0659   ACIDBASEDEF 5.0 (H) 06/01/2021 1156   O2SAT 91.0 06/02/2021 0659   CBG (last 3)  Recent Labs    06/02/21 0339 06/02/21 0824 06/02/21 1102  GLUCAP 144* 128* 128*    Assessment/Plan: S/P Procedure(s) (LRB): ECMO CANNULATION (Bilateral) Continue full support per ECMO team   LOS: 28 days    Dahlia Byes 06/02/2021

## 2021-06-02 NOTE — Progress Notes (Signed)
ANTICOAGULATION CONSULT NOTE  Pharmacy Consult for Bivalirudin Indication:  ECMO  Allergies  Allergen Reactions   Amoxicillin-Pot Clavulanate     Per pt report on 08/19/20, makes his urine dark colored   Atorvastatin Other (See Comments)     ( pt states causing runny nose, headaches, issue w/ urination)   Plant Derived Enzymes     Other reaction(s): Unknown   Trichophyton Other (See Comments)    Patient Measurements: Height: 5\' 10"  (177.8 cm) Weight: 85.6 kg (188 lb 11.4 oz) IBW/kg (Calculated) : 73  Vital Signs: Temp: 98.2 F (36.8 C) (02/14 0400) Temp Source: Core (02/14 0400) BP: 145/69 (02/14 0700) Pulse Rate: 77 (02/14 0700)  Labs: Recent Labs    05/31/21 0330 05/31/21 1015 06/01/21 0350 06/01/21 0353 06/01/21 1804 06/01/21 1951 06/01/21 2203 06/02/21 0004 06/02/21 0345 06/02/21 0539 06/02/21 0543 06/02/21 0627 06/02/21 0659  HGB 8.1*   < > 7.2*   < > 9.4*   < > 8.8*   < > 8.8*   < > 9.2* 9.2* 9.5*  HCT 25.4*   < > 23.8*   < > 29.9*   < > 26.0*   < > 27.3*   < > 27.0* 27.0* 28.0*  PLT 177   < > 182  --  171  --   --   --  167  --   --   --   --   APTT 56*   < > 59*  --  53*  --   --   --  60*  --   --   --   --   LABPROT 25.1*  --  25.4*  --   --   --   --   --  25.2*  --   --   --   --   INR 2.3*  --  2.3*  --   --   --   --   --  2.3*  --   --   --   --   CREATININE 1.17   < > 1.29*  --  1.28*  --  1.20  --  1.25*  --   --   --   --    < > = values in this interval not displayed.     Estimated Creatinine Clearance: 56 mL/min (A) (by C-G formula based on SCr of 1.25 mg/dL (H)).   Medical History: Past Medical History:  Diagnosis Date   Allergic rhinitis    Prostate cancer ([place])     Medications:  Infusions:   sodium chloride Stopped (06/01/21 1121)   albumin human Stopped (06/01/21 2119)   albumin human Stopped (05/30/21 0800)   bivalirudin (ANGIOMAX) infusion 0.5 mg/mL (Non-ACS indications) 0.065 mg/kg/hr (06/02/21 0700)   dexmedetomidine  (PRECEDEX) IV infusion 1.2 mcg/kg/hr (06/02/21 0722)   feeding supplement (VITAL 1.5 CAL) 1,000 mL (06/02/21 0029)   fentaNYL infusion INTRAVENOUS 25 mcg/hr (06/02/21 0818)   norepinephrine (LEVOPHED) Adult infusion Stopped (06/02/21 0536)    Assessment: 71 year old male with coronary artery disease who initially presented with chest pain, noted to have multivessel coronary artery disease. He underwent CABG x3 on 05/04/2021; course was complicated with atrial fibrillation, frequent hiccups and aspiration pneumonia leading to hypoxia and increasing oxygen requirement. Patient now requiring VV ECMO. Pharmacy consulted for bivalirudin IV.   aPTT 60 seconds on Bivalirudin 0.065mg /kg/hr  pltc stable 170s, hgb stable 9  LDH trending up 300>400> now back down 300 fibrinogen trend up 300>400> back down 300  Noted to have some bleeding from cannula and trach site 2/12 improvement after thrombi pad and surgicil. .  Goal of Therapy:  aPTT 50-80 seconds Monitor platelets by anticoagulation protocol: Yes   Plan:  Continue bivalirudin at 0.065 mg/kg/hr Monitor q12h aptt and CBC Monitor closely for s/sx bleeding/thrombus    Bonnita Nasuti Pharm.D. CPP, BCPS Clinical Pharmacist [phone] 06/02/2021 8:55 AM

## 2021-06-02 NOTE — Progress Notes (Signed)
ANTICOAGULATION CONSULT NOTE  Pharmacy Consult for Bivalirudin Indication:  ECMO  Allergies  Allergen Reactions   Amoxicillin-Pot Clavulanate     Per pt report on 08/19/20, makes his urine dark colored   Atorvastatin Other (See Comments)     ( pt states causing runny nose, headaches, issue w/ urination)   Plant Derived Enzymes     Other reaction(s): Unknown   Trichophyton Other (See Comments)    Patient Measurements: Height: 5\' 10"  (177.8 cm) Weight: 85.6 kg (188 lb 11.4 oz) IBW/kg (Calculated) : 73  Vital Signs: Temp: 98 F (36.7 C) (02/14 1200) Temp Source: Axillary (02/14 1200) BP: 173/68 (02/14 1645) Pulse Rate: 66 (02/14 1645)  Labs: Recent Labs    05/31/21 0330 05/31/21 1015 06/01/21 0350 06/01/21 0353 06/01/21 1804 06/01/21 1951 06/01/21 2203 06/02/21 0004 06/02/21 0345 06/02/21 0539 06/02/21 1344 06/02/21 1349 06/02/21 1620  HGB 8.1*   < > 7.2*   < > 9.4*   < > 8.8*   < > 8.8*   < > 10.2* 10.2* 9.8*  HCT 25.4*   < > 23.8*   < > 29.9*   < > 26.0*   < > 27.3*   < > 30.0* 30.0* 29.8*  PLT 177   < > 182  --  171  --   --   --  167  --   --   --  135*  APTT 56*   < > 59*  --  53*  --   --   --  60*  --   --   --  59*  LABPROT 25.1*  --  25.4*  --   --   --   --   --  25.2*  --   --   --   --   INR 2.3*  --  2.3*  --   --   --   --   --  2.3*  --   --   --   --   CREATININE 1.17   < > 1.29*  --  1.28*  --  1.20  --  1.25*  --   --   --  1.28*   < > = values in this interval not displayed.     Estimated Creatinine Clearance: 54.7 mL/min (A) (by C-G formula based on SCr of 1.28 mg/dL (H)).   Medical History: Past Medical History:  Diagnosis Date   Allergic rhinitis    Prostate cancer ([place])     Medications:  Infusions:   sodium chloride Stopped (06/01/21 1121)   albumin human 10 mL/hr at 06/02/21 1600   albumin human Stopped (05/30/21 0800)   bivalirudin (ANGIOMAX) infusion 0.5 mg/mL (Non-ACS indications) 0.065 mg/kg/hr (06/02/21 1600)    dexmedetomidine (PRECEDEX) IV infusion 0.6 mcg/kg/hr (06/02/21 1202)   feeding supplement (VITAL 1.5 CAL) 60 mL/hr at 06/02/21 1600   fentaNYL infusion INTRAVENOUS 100 mcg/hr (06/02/21 1600)   norepinephrine (LEVOPHED) Adult infusion Stopped (06/02/21 1331)    Assessment: 71 year old male with coronary artery disease who initially presented with chest pain, noted to have multivessel coronary artery disease. He underwent CABG x3 on 04/20/2021; course was complicated with atrial fibrillation, frequent hiccups and aspiration pneumonia leading to hypoxia and increasing oxygen requirement. Patient now requiring VV ECMO. Pharmacy consulted for bivalirudin IV.   aPTT 60 seconds on Bivalirudin 0.065mg /kg/hr  pltc stable 170s, hgb stable 9  LDH trending up 300>400> now back down 300 fibrinogen trend up 300>400> back down 300   Noted  to have some bleeding from cannula and trach site 2/12 improvement after thrombi pad and surgicil.  PM update: aPTT 59 on bivalirudin 0.065 mg/kg/hr is therapeutic. Level drawn appropriately. Circuit changed today with 3000 units heparin administered. Continued oozing noted from trach site and cannula site still (persistent - no change per RNs). Hgb 9.8. Plt 135.   Goal of Therapy:  aPTT 50-80 seconds Monitor platelets by anticoagulation protocol: Yes   Plan:  Continue bivalirudin at 0.065 mg/kg/hr Monitor q12h aptt and CBC Monitor closely for s/sx bleeding/thrombus    Cristela Felt, PharmD, BCPS Clinical Pharmacist 06/02/2021 5:25 PM

## 2021-06-02 NOTE — Progress Notes (Signed)
Echocardiogram 2D Echocardiogram has been performed.  Randall Hodges 06/02/2021, 8:17 AM

## 2021-06-02 NOTE — Progress Notes (Signed)
NAME:  DARCY CORDNER, MRN:  [id number], DOB:  [birth year], LOS: 71 ADMISSION DATE:  05/10/2021, CONSULTATION DATE: 05/02/2021 REFERRING MD:  Dahlia Byes, MD, CHIEF COMPLAINT: Increasing shortness of breath  History of Present Illness:  71 year old male with coronary artery disease who initially presented with chest pain, noted to have multivessel coronary artery disease, he underwent CABG x3 on 04/1818 23, course was complicated with atrial fibrillation, frequent hiccups and aspiration pneumonia leading to hypoxia and increasing oxygen requirement. PCCM was consulted for evaluation and help with management  Patient stated hiccups are better but continued complain of shortness of breath, cough unable to bring up phlegm.  Pertinent  Medical History   Past Medical History:  Diagnosis Date   Allergic rhinitis    Prostate cancer ([place])     Significant Hospital Events: Including procedures, antibiotic start and stop dates in addition to other pertinent events   1/18 CABG 1/30 PCCM consult, Worsening O2 needs, likely aspiration, new pressor need 2/2 intubation, placed on VV ECMO 2/5 issues with recirc, bronch neg, improved with supine positioning 2/7 percutaneous tracheostomy 2/10 diuresis and weaning sedation. CT chest shows diffuse ground-glass opacification.  2/11 weaning sedation 2/13 increasing air hunger as oxygenator efficiency decreasing.   Interim History / Subjective:   Still having bleeding at the trach site. Some chatter overnight. Increased air hunger with increased agitation.   Objective   Blood pressure (!) 145/69, pulse 77, temperature 98.2 F (36.8 C), temperature source Core, resp. rate (!) 36, height 5\' 10"  (1.778 m), weight 85.6 kg, SpO2 93 %. CVP:  [1 mmHg-35 mmHg] 11 mmHg  Vent Mode: PCV FiO2 (%):  [40 %-50 %] 50 % Set Rate:  [2 bmp-22 bmp] 2 bmp PEEP:  [10 cmH20] 10 cmH20   Intake/Output Summary (Last 24 hours) at 06/02/2021 0749 Last data filed at  06/02/2021 0700 Gross per 24 hour  Intake 2670.32 ml  Output 4870 ml  Net -2199.68 ml    Filed Weights   05/31/21 0600 06/01/21 0500 06/02/21 0348  Weight: 84.4 kg 86 kg 85.6 kg     Examination: General: average build appears acutely ill. On dexmedetomidine and norepinephrine HENT: tracheostomy and Cortrak in place without pressure injury. Cannula site intact. Minimal crusted blood.  Lungs: scattered crackles. On PCV with Pplat 24 driving pressure 15. 0.4/10.  Tachypnea.  VV  ECMO at 3000 rpm with sweep 10 Cardiovascular: HS normal  Abdomen: soft, still Flexiseal in place Extremities: edema +1 - much improved.  Neuro: moves all limbs and will open eyes weakly to command.  GU: Foley in place with abundant clear urine  Ancillary tests personally reviewed:   7.34/55.8/67/91 Na: 152 CO2 32  LDH 329 WBC 16.9 HB: 9.5   Assessment & Plan:  CAD with unstable angina s/p CABGx3 1/18 (LIMA-LAD, SVG-OM, SVG-left PDA) Presumed aspiration pneumonitis vs. HCAP vs.  culminating in severe ARDS with inability to ventilate along with high Aa gradient s/p VV ECMO cannulation 05/21/22.  CT shows predominant alveolitis - should resolve completely with time.  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  Hypoactive delirium   Plan:   - Change oxygenator as efficiency had dropped and CO2 not clearing.  - Increase sedation until oxygenator changed then try to wean again - Resume  - Continue BID baclofen and gabapentin for hiccups. - Taper steroids.  - Hold antihypertensives for today - Continue free  water - Continue to diurese.  - PT/OT w/ reduced sedation to promote activity    Best Practice (right click and "Reselect all SmartList Selections" daily)   Diet/type: Vital @ 52mL/hr DVT prophylaxis: Bival CBG: yes, SSI GI prophylaxis: PPI Lines: L Alvin TLC, RIJ   Foley:  Yes, keep Code Status:  full code Last date of multidisciplinary goals of care discussion [daily at bedside]  CRITICAL CARE Performed by: Kipp Brood   Total critical care time: 40 minutes  Critical care time was exclusive of separately billable procedures and treating other patients.  Critical care was necessary to treat or prevent imminent or life-threatening deterioration.  Critical care was time spent personally by me on the following activities: development of treatment plan with patient and/or surrogate as well as nursing, discussions with consultants, evaluation of patient's response to treatment, examination of patient, obtaining history from patient or surrogate, ordering and performing treatments and interventions, ordering and review of laboratory studies, ordering and review of radiographic studies, pulse oximetry, re-evaluation of patient's condition and participation in multidisciplinary rounds.  Kipp Brood, MD [facility] ICU Physician [place]  Pager: [phone] Mobile: [phone] After hours: [phone].

## 2021-06-02 NOTE — Progress Notes (Signed)
Occupational Therapy Treatment Patient Details Name: Randall Hodges MRN: [id number] DOB: 21-Jul-1950 Today's Date: 06/02/2021   History of present illness 71 yo male with CAD who initially presented with CP, noted to have multivessel CAD, he underwent CABG x3 on 04/28/2021, course was complicated with atrial fibrillation, frequent hiccups and aspiration PNA leading to hypoxia and increasing oxygen requirement. ETT 1/18-1/18. 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 Cortrak 1/30. 2/2 placed on VV ECMO. 2/7 trach. 2/14 ECMO circuit replacement. PMH: prostate CA   OT comments  Upon arrival, pt supine and alert (weaning from sedation). S/p ECMO circuit replacement this AM. Focused session on tilting with kreg bed and ROM exercises. Nods yes or no to questions intermittently, otherwise, no command following. Pt tolerated 20 minutes of tilt 40-45 degrees. Will continue to follow acutely as admitted and continue to recommend post-acute rehab.    Recommendations for follow up therapy are one component of a multi-disciplinary discharge planning process, led by the attending physician.  Recommendations may be updated based on patient status, additional functional criteria and insurance authorization.    Follow Up Recommendations  Acute inpatient rehab (3hours/day)    Assistance Recommended at Discharge Frequent or constant Supervision/Assistance  Patient can return home with the following  A lot of help with walking and/or transfers   Equipment Recommendations  Other (comment) (defer)    Recommendations for Other Services      Precautions / Restrictions Precautions Precautions: Fall;Sternal Precaution Comments: ECMO, cortrak, foley, aline, sternal precautions, flexiseal, trach on vent Restrictions Other Position/Activity Restrictions: sternal precautions       Mobility Bed Mobility Overal bed mobility: Needs Assistance             General bed mobility comments: Kreg bed tilt  40-45 degrees, 1340-1400    Transfers                         Balance                                           ADL either performed or assessed with clinical judgement   ADL Overall ADL's : Needs assistance/impaired     Grooming: Wash/dry face;Total assistance;Standing Grooming Details (indicate cue type and reason): totla hand over hand to wash eyes. standing in tilt bed at ~45*                               General ADL Comments: Tilt bed for ~20 minutes at 35-50*    Extremity/Trunk Assessment Upper Extremity Assessment Upper Extremity Assessment: RUE deficits/detail;LUE deficits/detail RUE Deficits / Details: Significant weakness. Able to perform hand squeezes. Initating scapular retractions. LUE Deficits / Details: Significant weakness. Able to perform hand squeezes. Initating scapular retractions.   Lower Extremity Assessment Lower Extremity Assessment: Defer to PT evaluation        Vision       Perception     Praxis      Cognition Arousal/Alertness: Awake/alert Behavior During Therapy: Flat affect Overall Cognitive Status: Difficult to assess                                 General Comments: Pt nodding "yes," or "no" to several questions today, blinks to command intermittently,  but otherwise does not tollow commands this session. Likely also impacted by sedation        Exercises Exercises: General Upper Extremity General Exercises - Upper Extremity Shoulder Flexion: PROM, Both, 10 reps, Other (comment) (standing in kreg bed) Elbow Flexion: PROM, Both, 10 reps, Other (comment) (standing in kreg bed) Elbow Extension: PROM, Both, 10 reps, Other (comment) (standing in kreg bed) Wrist Flexion: PROM, Both, 10 reps (standing in kreg bed) Wrist Extension: PROM, Both, 10 reps (standing in kreg bed) Other Exercises Other Exercises: bilat knee extension in tilt with repeated tactile and verbal cues, but no  purposeful activation noted by pt    Shoulder Instructions       General Comments monitoring HR, BP, and spo2 throughout. RR elevating slightly at end of session to 38    Pertinent Vitals/ Pain       Pain Assessment Pain Assessment: Faces Faces Pain Scale: Hurts a little bit Pain Location: pt grimacing with left knee passive flexion; nods "yes," to when asks if it hurts Pain Descriptors / Indicators: Grimacing Pain Intervention(s): Monitored during session, Limited activity within patient's tolerance, Repositioned  Home Living                                          Prior Functioning/Environment              Frequency  Min 2X/week        Progress Toward Goals  OT Goals(current goals can now be found in the care plan section)  Progress towards OT goals: Not progressing toward goals - comment  Acute Rehab OT Goals OT Goal Formulation: Patient unable to participate in goal setting Time For Goal Achievement: 06/05/21 Potential to Achieve Goals: Good ADL Goals Pt Will Perform Grooming: with mod assist;sitting Pt Will Transfer to Toilet: with +2 assist;with max assist;stand pivot transfer;bedside commode Additional ADL Goal #1: pt will follow 1 step commands 50% of session Additional ADL Goal #2: pt will tolerate eob dangle 3 minutes with stable VSS with mod Support as precursor to adls  Plan Discharge plan remains appropriate    Co-evaluation    PT/OT/SLP Co-Evaluation/Treatment: Yes Reason for Co-Treatment: For patient/therapist safety;To address functional/ADL transfers PT goals addressed during session: Mobility/safety with mobility OT goals addressed during session: ADL's and self-care      AM-PAC OT "6 Clicks" Daily Activity     Outcome Measure   Help from another person eating meals?: Total Help from another person taking care of personal grooming?: Total Help from another person toileting, which includes using toliet, bedpan, or  urinal?: Total Help from another person bathing (including washing, rinsing, drying)?: Total Help from another person to put on and taking off regular upper body clothing?: Total Help from another person to put on and taking off regular lower body clothing?: Total 6 Click Score: 6    End of Session Equipment Utilized During Treatment: Oxygen  OT Visit Diagnosis: Muscle weakness (generalized) (M62.81)   Activity Tolerance Patient limited by fatigue   Patient Left in bed;with call bell/phone within reach;with nursing/sitter in room   Nurse Communication Mobility status;Precautions        Time: 3614-4315 OT Time Calculation (min): 47 min  Charges: OT General Charges $OT Visit: 1 Visit OT Treatments $Therapeutic Activity: 8-22 mins  [place], OTR/L Acute Rehab Pager: [phone] Office: [place] 06/02/2021, 5:35  PM

## 2021-06-03 ENCOUNTER — Inpatient Hospital Stay (HOSPITAL_COMMUNITY): Payer: PPO

## 2021-06-03 DIAGNOSIS — I361 Nonrheumatic tricuspid (valve) insufficiency: Secondary | ICD-10-CM | POA: Diagnosis not present

## 2021-06-03 DIAGNOSIS — J9601 Acute respiratory failure with hypoxia: Secondary | ICD-10-CM | POA: Diagnosis not present

## 2021-06-03 LAB — TYPE AND SCREEN
ABO/RH(D): O POS
Antibody Screen: NEGATIVE
Unit division: 0
Unit division: 0
Unit division: 0
Unit division: 0
Unit division: 0
Unit division: 0
Unit division: 0
Unit division: 0
Unit division: 0
Unit division: 0
Unit division: 0

## 2021-06-03 LAB — BPAM RBC
Blood Product Expiration Date: 202303132359
Blood Product Expiration Date: 202303132359
Blood Product Expiration Date: 202303132359
Blood Product Expiration Date: 202303132359
Blood Product Expiration Date: 202303132359
Blood Product Expiration Date: 202303132359
Blood Product Expiration Date: 202303132359
Blood Product Expiration Date: 202303142359
Blood Product Expiration Date: 202303142359
Blood Product Expiration Date: 202303142359
Blood Product Expiration Date: 202303142359
ISSUE DATE / TIME: 202302130846
ISSUE DATE / TIME: 202302131345
ISSUE DATE / TIME: 202302140809
ISSUE DATE / TIME: 202302140809
ISSUE DATE / TIME: 202302140809
ISSUE DATE / TIME: 202302140809
ISSUE DATE / TIME: 202302150344
ISSUE DATE / TIME: 202302150344
ISSUE DATE / TIME: 202302150344
Unit Type and Rh: 5100
Unit Type and Rh: 5100
Unit Type and Rh: 5100
Unit Type and Rh: 5100
Unit Type and Rh: 5100
Unit Type and Rh: 5100
Unit Type and Rh: 5100
Unit Type and Rh: 5100
Unit Type and Rh: 5100
Unit Type and Rh: 5100
Unit Type and Rh: 5100

## 2021-06-03 LAB — BLOOD CULTURE ID PANEL (REFLEXED) - BCID2

## 2021-06-03 LAB — POCT I-STAT 7, (LYTES, BLD GAS, ICA,H+H)
Acid-Base Excess: 3 mmol/L — ABNORMAL HIGH (ref 0.0–2.0)
Acid-Base Excess: 3 mmol/L — ABNORMAL HIGH (ref 0.0–2.0)
Acid-Base Excess: 3 mmol/L — ABNORMAL HIGH (ref 0.0–2.0)
Bicarbonate: 29.1 mmol/L — ABNORMAL HIGH (ref 20.0–28.0)
Bicarbonate: 29.3 mmol/L — ABNORMAL HIGH (ref 20.0–28.0)
Bicarbonate: 28.8 mmol/L — ABNORMAL HIGH (ref 20.0–28.0)
Calcium, Ion: 1.22 mmol/L (ref 1.15–1.40)
Calcium, Ion: 1.26 mmol/L (ref 1.15–1.40)
Calcium, Ion: 1.21 mmol/L (ref 1.15–1.40)
HCT: 29 % — ABNORMAL LOW (ref 39.0–52.0)
HCT: 29 % — ABNORMAL LOW (ref 39.0–52.0)
HCT: 28 % — ABNORMAL LOW (ref 39.0–52.0)
Hemoglobin: 9.9 g/dL — ABNORMAL LOW (ref 13.0–17.0)
Hemoglobin: 9.9 g/dL — ABNORMAL LOW (ref 13.0–17.0)
Hemoglobin: 9.5 g/dL — ABNORMAL LOW (ref 13.0–17.0)
O2 Saturation: 96 %
O2 Saturation: 96 %
O2 Saturation: 92 %
Patient temperature: 36.7
Patient temperature: 36.7
Patient temperature: 96.7
Potassium: 4.5 mmol/L (ref 3.5–5.1)
Potassium: 4.4 mmol/L (ref 3.5–5.1)
Potassium: 4.2 mmol/L (ref 3.5–5.1)
Sodium: 151 mmol/L — ABNORMAL HIGH (ref 135–145)
Sodium: 150 mmol/L — ABNORMAL HIGH (ref 135–145)
Sodium: 151 mmol/L — ABNORMAL HIGH (ref 135–145)
TCO2: 31 mmol/L (ref 22–32)
TCO2: 31 mmol/L (ref 22–32)
TCO2: 30 mmol/L (ref 22–32)
pCO2 arterial: 52.4 mmHg — ABNORMAL HIGH (ref 32–48)
pCO2 arterial: 52.9 mmHg — ABNORMAL HIGH (ref 32–48)
pCO2 arterial: 47.1 mmHg (ref 32–48)
pH, Arterial: 7.351 (ref 7.35–7.45)
pH, Arterial: 7.351 (ref 7.35–7.45)
pH, Arterial: 7.39 (ref 7.35–7.45)
pO2, Arterial: 83 mmHg (ref 83–108)
pO2, Arterial: 89 mmHg (ref 83–108)
pO2, Arterial: 61 mmHg — ABNORMAL LOW (ref 83–108)

## 2021-06-03 LAB — BASIC METABOLIC PANEL
Anion gap: 7 (ref 5–15)
Anion gap: 5 (ref 5–15)
BUN: 77 mg/dL — ABNORMAL HIGH (ref 8–23)
BUN: 75 mg/dL — ABNORMAL HIGH (ref 8–23)
CO2: 29 mmol/L (ref 22–32)
CO2: 29 mmol/L (ref 22–32)
Calcium: 7.9 mg/dL — ABNORMAL LOW (ref 8.9–10.3)
Calcium: 7.7 mg/dL — ABNORMAL LOW (ref 8.9–10.3)
Chloride: 115 mmol/L — ABNORMAL HIGH (ref 98–111)
Chloride: 115 mmol/L — ABNORMAL HIGH (ref 98–111)
Creatinine, Ser: 1.17 mg/dL (ref 0.61–1.24)
Creatinine, Ser: 1.1 mg/dL (ref 0.61–1.24)
GFR, Estimated: >60 mL/min (ref >60)
GFR, Estimated: >60 mL/min (ref >60)
Glucose, Bld: 176 mg/dL — ABNORMAL HIGH (ref 70–99)
Glucose, Bld: 170 mg/dL — ABNORMAL HIGH (ref 70–99)
Potassium: 4.6 mmol/L (ref 3.5–5.1)
Potassium: 4.2 mmol/L (ref 3.5–5.1)
Sodium: 151 mmol/L — ABNORMAL HIGH (ref 135–145)
Sodium: 149 mmol/L — ABNORMAL HIGH (ref 135–145)

## 2021-06-03 LAB — GLUCOSE, CAPILLARY
Glucose-Capillary: 163 mg/dL — ABNORMAL HIGH (ref 70–99)
Glucose-Capillary: 137 mg/dL — ABNORMAL HIGH (ref 70–99)
Glucose-Capillary: 109 mg/dL — ABNORMAL HIGH (ref 70–99)
Glucose-Capillary: 152 mg/dL — ABNORMAL HIGH (ref 70–99)
Glucose-Capillary: 190 mg/dL — ABNORMAL HIGH (ref 70–99)
Glucose-Capillary: 176 mg/dL — ABNORMAL HIGH (ref 70–99)

## 2021-06-03 LAB — CBC
HCT: 31.3 % — ABNORMAL LOW (ref 39.0–52.0)
HCT: 29.5 % — ABNORMAL LOW (ref 39.0–52.0)
Hemoglobin: 10.2 g/dL — ABNORMAL LOW (ref 13.0–17.0)
Hemoglobin: 9.2 g/dL — ABNORMAL LOW (ref 13.0–17.0)
MCH: 29.8 pg (ref 26.0–34.0)
MCH: 29.2 pg (ref 26.0–34.0)
MCHC: 32.6 g/dL (ref 30.0–36.0)
MCHC: 31.2 g/dL (ref 30.0–36.0)
MCV: 91.5 fL (ref 80.0–100.0)
MCV: 93.7 fL (ref 80.0–100.0)
Platelets: 150 10*3/uL (ref 150–400)
Platelets: 146 10*3/uL — ABNORMAL LOW (ref 150–400)
RBC: 3.42 MIL/uL — ABNORMAL LOW (ref 4.22–5.81)
RBC: 3.15 MIL/uL — ABNORMAL LOW (ref 4.22–5.81)
RDW: 17.2 % — ABNORMAL HIGH (ref 11.5–15.5)
RDW: 17.2 % — ABNORMAL HIGH (ref 11.5–15.5)
WBC: 12.3 10*3/uL — ABNORMAL HIGH (ref 4.0–10.5)
WBC: 13.3 10*3/uL — ABNORMAL HIGH (ref 4.0–10.5)
nRBC: 0 % (ref 0.0–0.2)
nRBC: 0 % (ref 0.0–0.2)

## 2021-06-03 LAB — HEPATIC FUNCTION PANEL
ALT: 142 U/L — ABNORMAL HIGH (ref 0–44)
AST: 79 U/L — ABNORMAL HIGH (ref 15–41)
Albumin: 2.6 g/dL — ABNORMAL LOW (ref 3.5–5.0)
Alkaline Phosphatase: 132 U/L — ABNORMAL HIGH (ref 38–126)
Bilirubin, Direct: 0.3 mg/dL — ABNORMAL HIGH (ref 0.0–0.2)
Indirect Bilirubin: 0.7 mg/dL (ref 0.3–0.9)
Total Bilirubin: 1 mg/dL (ref 0.3–1.2)
Total Protein: 4.4 g/dL — ABNORMAL LOW (ref 6.5–8.1)

## 2021-06-03 LAB — PROTIME-INR
INR: 2.1 — ABNORMAL HIGH (ref 0.8–1.2)
Prothrombin Time: 23.6 seconds — ABNORMAL HIGH (ref 11.4–15.2)

## 2021-06-03 LAB — LACTIC ACID, PLASMA
Lactic Acid, Venous: 1.2 mmol/L (ref 0.5–1.9)
Lactic Acid, Venous: 1.8 mmol/L (ref 0.5–1.9)

## 2021-06-03 LAB — APTT
aPTT: 55 seconds — ABNORMAL HIGH (ref 24–36)
aPTT: 60 seconds — ABNORMAL HIGH (ref 24–36)

## 2021-06-03 LAB — FIBRINOGEN: Fibrinogen: 347 mg/dL (ref 210–475)

## 2021-06-03 LAB — LACTATE DEHYDROGENASE: LDH: 339 U/L — ABNORMAL HIGH (ref 98–192)

## 2021-06-03 MED ORDER — MORPHINE SULFATE 15 MG PO TABS
15.0000 mg | ORAL_TABLET | Freq: Four times a day (QID) | ORAL | Status: DC
  Administered 2021-06-03 – 2021-06-04 (×5): 15 mg
  Filled 2021-06-03 (×5): qty 1

## 2021-06-03 MED ORDER — AMLODIPINE BESYLATE 5 MG PO TABS: 5.0000 mg | ORAL_TABLET | Freq: Every day | ORAL | Status: DC

## 2021-06-03 MED ORDER — VANCOMYCIN HCL 2000 MG/400ML IV SOLN
2000.0000 mg | Freq: Once | INTRAVENOUS | Status: AC
  Administered 2021-06-03: 2000 mg via INTRAVENOUS
  Filled 2021-06-03: qty 400

## 2021-06-03 MED ORDER — QUETIAPINE FUMARATE 25 MG PO TABS: 25.0000 mg | ORAL_TABLET | Freq: Every day | ORAL | Status: DC

## 2021-06-03 MED ORDER — FREE WATER
300.0000 mL | Status: DC
  Administered 2021-06-03 – 2021-06-05 (×24): 300 mL

## 2021-06-03 MED ORDER — CLONAZEPAM 0.5 MG PO TBDP
0.5000 mg | ORAL_TABLET | Freq: Every day | ORAL | Status: DC
  Administered 2021-06-03: 0.5 mg
  Filled 2021-06-03: qty 1

## 2021-06-03 MED ORDER — FUROSEMIDE 10 MG/ML IJ SOLN
40.0000 mg | Freq: Two times a day (BID) | INTRAMUSCULAR | Status: AC
  Administered 2021-06-03 (×2): 40 mg via INTRAVENOUS
  Filled 2021-06-03 (×2): qty 4

## 2021-06-03 MED ORDER — VANCOMYCIN HCL 1750 MG/350ML IV SOLN
1750.0000 mg | INTRAVENOUS | Status: AC
  Administered 2021-06-04 – 2021-06-05 (×2): 1750 mg via INTRAVENOUS
  Administered 2021-06-06: 08:00:00 1000 mg via INTRAVENOUS
  Administered 2021-06-07 – 2021-06-10 (×4): 1750 mg via INTRAVENOUS
  Filled 2021-06-03 (×7): qty 350

## 2021-06-03 MED ORDER — QUETIAPINE FUMARATE 25 MG PO TABS
25.0000 mg | ORAL_TABLET | Freq: Every day | ORAL | Status: DC
  Administered 2021-06-03 – 2021-06-16 (×14): 25 mg
  Filled 2021-06-03 (×15): qty 1

## 2021-06-03 MED ORDER — CARVEDILOL 3.125 MG PO TABS
3.1250 mg | ORAL_TABLET | Freq: Two times a day (BID) | ORAL | Status: DC
  Administered 2021-06-03: 3.125 mg
  Filled 2021-06-03 (×2): qty 1

## 2021-06-03 MED ORDER — AMLODIPINE BESYLATE 5 MG PO TABS
5.0000 mg | ORAL_TABLET | Freq: Every day | ORAL | Status: DC
  Administered 2021-06-03: 5 mg via ORAL
  Filled 2021-06-03: qty 1

## 2021-06-03 NOTE — Progress Notes (Signed)
Pharmacy Antibiotic Note  Randall Hodges is a 71 y.o. male admitted on 05/04/2021 with Pneumonia/ARDS. Current ly on VV ECMO since 2/2. 2/14 1/4 GPC Bcx and trach aspirate with BCID detecting staph epi w/ MecA. Now concern for possible staph epi bacteremia. WBC 12.3, Tmax 98.2. Pharmacy has been consulted for vancomycin dosing.  Plan: Vancomycin IV 2031m x 1 load Followed by vancomycin IV [id number]  F/u renal function, Cx, and length of therapy  Height: _0  (177.8 cm) Weight: 87.8 kg (193 lb 9 oz) IBW/kg (Calculated) : 73  Temp (24hrs), Avg:97.9 F (36.6 C), Min:97.4 F (36.3 C), Max:98.2 F (36.8 C)  Recent Labs  Lab 05/29/21 0424 05/29/21 1559 05/31/21 0330 05/31/21 1604 06/01/21 0350 06/01/21 1804 06/01/21 2203 06/02/21 0345 06/02/21 1620 06/03/21 0342  WBC 16.8*   < > 16.9*   < > 15.1* 14.4*  --  14.8* 12.0* 12.3*  CREATININE 1.03   < > 1.17   < > 1.29* 1.28* 1.20 1.25* 1.28* 1.17  LATICACIDVEN 1.3  --  1.2  --  1.3  --   --  1.3  --  1.2   < > = values in this interval not displayed.    Estimated Creatinine Clearance: 64.6 mL/min (by C-G formula based on SCr of 1.17 mg/dL).    Allergies  Allergen Reactions   Amoxicillin-Pot Clavulanate     Per pt report on 08/19/20, makes his urine dark colored   Atorvastatin Other (See Comments)     ( pt states causing runny nose, headaches, issue w/ urination)   Plant Derived Enzymes     Other reaction(s): Unknown   Trichophyton Other (See Comments)    Antimicrobials this admission: Cefepime 1/29 >> 1/30 Zosyn 1/30 >>2/2 Meropenem 2/2> 2/9 Vanc 1/31 >> 2/5, 2/15>  Microbiology results: 2/14 Bcx 1/4 GPC, BCID staph epi  2/14 RespCx GPC 2/2 BAL: neg, PJP negative 2/2 resp panel: neg  Thank you for allowing pharmacy to be a part of this patients care.  ShLevonne Spiller/15/2023 7:41 AM

## 2021-06-03 NOTE — Progress Notes (Signed)
ANTICOAGULATION CONSULT NOTE  Pharmacy Consult for Bivalirudin Indication:  ECMO  Allergies  Allergen Reactions   Amoxicillin-Pot Clavulanate     Per pt report on 08/19/20, makes his urine dark colored   Atorvastatin Other (See Comments)     ( pt states causing runny nose, headaches, issue w/ urination)   Plant Derived Enzymes     Other reaction(s): Unknown   Trichophyton Other (See Comments)    Patient Measurements: Height: 5\' 10"  (177.8 cm) Weight: 87.8 kg (193 lb 9 oz) IBW/kg (Calculated) : 73  Vital Signs: BP: 148/68 (02/15 1630) Pulse Rate: 68 (02/15 1630)  Labs: Recent Labs    06/01/21 0350 06/01/21 0353 06/02/21 0345 06/02/21 0539 06/02/21 1620 06/02/21 1949 06/03/21 0342 06/03/21 0347 06/03/21 0607 06/03/21 1231 06/03/21 1611  HGB 7.2*   < > 8.8*   < > 9.8*   < > 10.2*   < > 9.9* 9.5* 9.2*  HCT 23.8*   < > 27.3*   < > 29.8*   < > 31.3*   < > 29.0* 28.0* 29.5*  PLT 182   < > 167  --  135*  --  150  --   --   --  146*  APTT 59*   < > 60*  --  59*  --  55*  --   --   --  60*  LABPROT 25.4*  --  25.2*  --   --   --  23.6*  --   --   --   --   INR 2.3*  --  2.3*  --   --   --  2.1*  --   --   --   --   CREATININE 1.29*   < > 1.25*  --  1.28*  --  1.17  --   --   --  1.10   < > = values in this interval not displayed.     Estimated Creatinine Clearance: 68.7 mL/min (by C-G formula based on SCr of 1.1 mg/dL).   Medical History: Past Medical History:  Diagnosis Date   Allergic rhinitis    Prostate cancer ([place])     Medications:  Infusions:   sodium chloride Stopped (06/01/21 1121)   sodium chloride 10 mL/hr at 06/03/21 1700   sodium chloride 10 mL/hr at 06/03/21 1700   albumin human Stopped (06/03/21 0958)   albumin human Stopped (05/30/21 0800)   bivalirudin (ANGIOMAX) infusion 0.5 mg/mL (Non-ACS indications) 0.065 mg/kg/hr (06/03/21 1700)   dexmedetomidine (PRECEDEX) IV infusion 1.2 mcg/kg/hr (06/03/21 1700)   feeding supplement (VITAL 1.5 CAL) 60  mL/hr at 06/03/21 1700   fentaNYL infusion INTRAVENOUS 125 mcg/hr (06/03/21 1700)   [START ON 06/04/2021] vancomycin      Assessment: 71 year old male with coronary artery disease who initially presented with chest pain, noted to have multivessel coronary artery disease. He underwent CABG x3 on 04/19/2021; course was complicated with atrial fibrillation, frequent hiccups and aspiration pneumonia leading to hypoxia and increasing oxygen requirement. Patient now requiring VV ECMO. Pharmacy consulted for bivalirudin IV.   aPTT 55 seconds on Bivalirudin 0.065mg /kg/hr pltc stable 150s, hgb stable 9s. LDH trending up 300>400> now back down 300 fibrinogen trend up 300>400> back down 339  Noted to have some bleeding from cannula and trach site 2/12 improvement after thrombi pad and surgicel.  Oxygenator and trach changed out 2/14.   PM update: aPTT of 60 is therapeutic on bivalirudin 0.065 mg/kg/hr. Level drawn appropriately. Trach site and cannula site  oozing resolved per RNs. No other bleeding noted.   Goal of Therapy:  aPTT 50-80 seconds Monitor platelets by anticoagulation protocol: Yes   Plan:  Continue bivalirudin at 0.065 mg/kg/hr Monitor q12h aptt and CBC Monitor closely for s/sx bleeding/thrombus  Cristela Felt, PharmD, BCPS Clinical Pharmacist 06/03/2021 5:20 PM

## 2021-06-03 NOTE — Progress Notes (Signed)
Patient ID: CAMRON MONDAY, male   DOB: May 17, 1950, 71 y.o.   MRN: [id number] Extracorporeal support note  ECLS support day: 12 Indication: ARDS  Configuration: VV  Drainage cannula: Crescent R IJ Return cannula: Crescent R IJ  Pump speed: 2700 rpm Pump flow: 3.4 L/min Pump used: Cardiohelp  Sweep gas: 4.5    Circuit check: Small amount thrombus. Good color change. LDH stable 339 Anticoagulant: Bivalirudin, PTT goal 50-80. PTT 55  Changes in support: No changes today  Anticipated goals/duration of support: Wean to discontinuation  Loralie Champagne, MD  7:32 AM

## 2021-06-03 NOTE — Progress Notes (Signed)
NAME:  QUINTYN DOMBEK, MRN:  [id number], DOB:  1950/11/14, LOS: 55 ADMISSION DATE:  04/29/2021, CONSULTATION DATE: 05/15/2021 REFERRING MD:  Dahlia Byes, MD, CHIEF COMPLAINT: Increasing shortness of breath  History of Present Illness:  71 year old male with coronary artery disease who initially presented with chest pain, noted to have multivessel coronary artery disease, he underwent CABG x3 on 04/1818 23, course was complicated with atrial fibrillation, frequent hiccups and aspiration pneumonia leading to hypoxia and increasing oxygen requirement. PCCM was consulted for evaluation and help with management  Patient stated hiccups are better but continued complain of shortness of breath, cough unable to bring up phlegm.  Pertinent  Medical History   Past Medical History:  Diagnosis Date   Allergic rhinitis    Prostate cancer ([place])     Significant Hospital Events: Including procedures, antibiotic start and stop dates in addition to other pertinent events   1/18 CABG 1/30 PCCM consult, Worsening O2 needs, likely aspiration, new pressor need 2/2 intubation, placed on VV ECMO 2/5 issues with recirc, bronch neg, improved with supine positioning 2/7 percutaneous tracheostomy 2/10 diuresis and weaning sedation. CT chest shows diffuse ground-glass opacification.  2/11 weaning sedation 2/13 increasing air hunger as oxygenator efficiency decreasing.  2/14 oxygenator changed.  Still having air hunger but able to follow commands and participate with therapy.   Interim History / Subjective:   Continues to have periods of agitation with tachypnea.     Objective   Blood pressure (!) 148/84, pulse 79, temperature 98.1 F (36.7 C), temperature source Core, resp. rate 10, height 5\' 10"  (1.778 m), weight 87.8 kg, SpO2 96 %. CVP:  [1 mmHg-23 mmHg] 11 mmHg  Vent Mode: PCV FiO2 (%):  [50 %-60 %] 60 % Set Rate:  [22 bmp] 22 bmp PEEP:  [10 cmH20] 10 cmH20 Plateau Pressure:  [10 cmH20-25 cmH20] 10  cmH20   Intake/Output Summary (Last 24 hours) at 06/03/2021 0742 Last data filed at 06/03/2021 0700 Gross per 24 hour  Intake 5472.89 ml  Output 4075 ml  Net 1397.89 ml    Filed Weights   06/01/21 0500 06/02/21 0348 06/03/21 0500  Weight: 86 kg 85.6 kg 87.8 kg     Examination: General: average build appears acutely ill. On dexmedetomidine and fentanyl  HENT: tracheostomy and Cortrak in place without pressure injury. Cannula site intact. Minimal crusted blood.  Lungs: scattered crackles. On PCV with Pplat 24 driving pressure 15. 0.4/10.  Tachypnea.  VV  ECMO at 3000 rpm with sweep 4.5 Cardiovascular: HS normal  Abdomen: soft, still Flexiseal in place Extremities: edema +1 - much improved.  Neuro: moves all limbs and will open eyes weakly to command.  GU: Foley in place with abundant clear urine  Ancillary tests personally reviewed:   7.34/52.9/89/96 Na: 150 CO2 29 LDH 339 WBC 16.9 HB: 9.9 INR 2.1 Staph epi in 1 blood and sputum Assessment & Plan:  CAD with unstable angina s/p CABGx3 1/18 (LIMA-LAD, SVG-OM, SVG-left PDA) Presumed aspiration pneumonitis vs. HCAP vs.  culminating in severe ARDS with inability to ventilate along with high Aa gradient s/p VV ECMO cannulation 05/21/22.  CT shows predominant alveolitis - should resolve completely with time.  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  Hypoactive delirium   Plan:   - Optimize sedation - restart seroquel, add morphine for pain and air hunger. Continue clonazepam.  - Continue BID baclofen and  gabapentin for hiccups. Seems to have stopped.  - Taper steroids.  - Resume antihypertensives.  - Continue free water - Continue to diurese.  - PT/OT w/ reduced sedation to promote activity  - Start vancomycin for 7 days as patient oxygenation seems a little worse - Stop laxative to stop diarrhea  which is contributing to hypernatremia.   Best Practice (right click and "Reselect all SmartList Selections" daily)   Diet/type: Vital @ 63mL/hr DVT prophylaxis: Bival CBG: yes, SSI GI prophylaxis: PPI Lines: L Athelstan TLC, RIJ  Foley:  Yes, keep Code Status:  full code Last date of multidisciplinary goals of care discussion [daily at bedside]  CRITICAL CARE Performed by: Kipp Brood   Total critical care time: 40 minutes  Critical care time was exclusive of separately billable procedures and treating other patients.  Critical care was necessary to treat or prevent imminent or life-threatening deterioration.  Critical care was time spent personally by me on the following activities: development of treatment plan with patient and/or surrogate as well as nursing, discussions with consultants, evaluation of patient's response to treatment, examination of patient, obtaining history from patient or surrogate, ordering and performing treatments and interventions, ordering and review of laboratory studies, ordering and review of radiographic studies, pulse oximetry, re-evaluation of patient's condition and participation in multidisciplinary rounds.  Kipp Brood, MD [facility] ICU Physician [place]  Pager: [phone] Mobile: [phone] After hours: [phone].

## 2021-06-03 NOTE — Progress Notes (Signed)
° °   °  [address]       [place] [address]             [phone]      Sedated  BP (!) 148/68    Pulse 68    Temp 98.1 F (36.7 C) (Core)    Resp 15    Ht 5\' 10"  (1.778 m)    Wt 87.8 kg    SpO2 (!) 82%    BMI 27.77 kg/m   PCV 10/40%/ 12 PEEP  VV ECMO flow 3.5 L/min  K= 4.2, creatinine 1.1 Hct 29  Continue support  Randall Hodges C. Roxan Hockey, MD [facility] [phone]

## 2021-06-03 NOTE — Progress Notes (Signed)
Physical Therapy Treatment Patient Details Name: Randall Hodges MRN: [id number] DOB: 1950-12-08 Today's Date: 06/03/2021   History of Present Illness 71 yo male with CAD who initially presented with CP, noted to have multivessel CAD, he underwent CABG x3 on 05/14/2021, course was complicated with atrial fibrillation, frequent hiccups and aspiration PNA leading to hypoxia and increasing oxygen requirement. ETT 1/18-1/18. 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 Cortrak 1/30. 2/2 placed on VV ECMO. 2/7 trach. 2/14 ECMO circuit replacement. PMH: prostate CA    PT Comments    Pt keeping eyes open throughout session but no attempts to communicate through head nods today. Pt did display brief moments to follow simple commands to activate muscles intermittently along with attempts at reactional strategies sitting EOB. Due to deficits in cognition and strength, he is still requiring TA for all bed mobility aspects. However, he did progress from requiring TA to sit EOB to performing x3 reps of ~3 sec static sitting holds with only min guard assist before needing increased assistance to regain balance. Will continue to follow acutely. Current recommendations remain appropriate.    Recommendations for follow up therapy are one component of a multi-disciplinary discharge planning process, led by the attending physician.  Recommendations may be updated based on patient status, additional functional criteria and insurance authorization.  Follow Up Recommendations  Acute inpatient rehab (3hours/day)     Assistance Recommended at Discharge Frequent or constant Supervision/Assistance  Patient can return home with the following A lot of help with walking and/or transfers;A lot of help with bathing/dressing/bathroom;Two people to help with walking and/or transfers;Two people to help with bathing/dressing/bathroom;Assistance with feeding;Assistance with cooking/housework;Direct supervision/assist for  medications management;Direct supervision/assist for financial management;Assist for transportation;Help with stairs or ramp for entrance   Equipment Recommendations  BSC/3in1;Wheelchair (measurements PT);Wheelchair cushion (measurements PT);Hospital bed;Other (comment) (hoyer lift)    Recommendations for Other Services       Precautions / Restrictions Precautions Precautions: Fall;Sternal Precaution Booklet Issued: No Precaution Comments: ECMO, cortrak, foley, aline, sternal precautions, flexiseal, trach on vent Restrictions Weight Bearing Restrictions: Yes Other Position/Activity Restrictions: sternal precautions     Mobility  Bed Mobility Overal bed mobility: Needs Assistance Bed Mobility: Supine to Sit, Sit to Supine, Rolling Rolling: +2 for physical assistance, Total assist, +2 for safety/equipment   Supine to sit: +2 for physical assistance, Total assist, +2 for safety/equipment, HOB elevated Sit to supine: Total assist, +2 for physical assistance, +2 for safety/equipment, HOB elevated   General bed mobility comments: Pt with no attempts to actively assist with bed mobility, needing TAx2-4 for lines and safety for all bed mobility aspects. Pt with posterior lean coming to sit EOB, needing assistance to flex trunk and scoot L hip posterior. Placed step stool under feet for sitting EOB for support.    Transfers                   General transfer comment: Unable    Ambulation/Gait               General Gait Details: unable   Stairs             Wheelchair Mobility    Modified Rankin (Stroke Patients Only)       Balance Overall balance assessment: Needs assistance Sitting-balance support: No upper extremity supported, Feet supported Sitting balance-Leahy Scale: Zero Sitting balance - Comments: Initially, pt requiring TA to sit EOB due to trunk extension. Placed pt's hands on lap and cued pt to achieve anterior  weight shift. Pt with moments of  trunk activation and attempts to catch balance seen through his hands briefly grabbing for his knees. x3 reps of ~3 sec holds at min guard sitting EOB. Cues to adduct scapulas and look superiorly.       Standing balance comment: unable                            Cognition Arousal/Alertness: Awake/alert Behavior During Therapy: Flat affect Overall Cognitive Status: Difficult to assess                                 General Comments: Pt not nodding head at all today, but did demonstrate moments of muscle flicker to follow simple commands < 10% of time. Occasional attempts at reactional strategies sitting EOB.        Exercises Other Exercises Other Exercises: Attempted to cue pt to perform LAQ, but just mild intermittent flicker noted in R quad Other Exercises: Flicker muscle activation noted 3x to adduct scapulas sitting EOB Other Exercises: Attempted AAROM for UE movement but min to no activation from pt    General Comments General comments (skin integrity, edema, etc.): SpO2 >/= 86% on trach on vent 40% PEEP 10      Pertinent Vitals/Pain Pain Assessment Pain Assessment: Faces Faces Pain Scale: Hurts little more Pain Location: grimacing with sitting EOB Pain Descriptors / Indicators: Grimacing Pain Intervention(s): Limited activity within patient's tolerance, Repositioned, Monitored during session    Home Living                          Prior Function            PT Goals (current goals can now be found in the care plan section) Acute Rehab PT Goals Patient Stated Goal: did not state PT Goal Formulation: Patient unable to participate in goal setting Time For Goal Achievement: 06/05/21 Potential to Achieve Goals: Fair Progress towards PT goals: Progressing toward goals    Frequency    Min 3X/week      PT Plan Current plan remains appropriate    Co-evaluation              AM-PAC PT "6 Clicks" Mobility   Outcome  Measure  Help needed turning from your back to your side while in a flat bed without using bedrails?: Total Help needed moving from lying on your back to sitting on the side of a flat bed without using bedrails?: Total Help needed moving to and from a bed to a chair (including a wheelchair)?: Total Help needed standing up from a chair using your arms (e.g., wheelchair or bedside chair)?: Total Help needed to walk in hospital room?: Total Help needed climbing 3-5 steps with a railing? : Total 6 Click Score: 6    End of Session Equipment Utilized During Treatment: Oxygen Activity Tolerance: Other (comment) (limited by impaired cognition) Patient left: in bed;with call bell/phone within reach;with nursing/sitter in room Nurse Communication: Mobility status PT Visit Diagnosis: Muscle weakness (generalized) (M62.81);Other abnormalities of gait and mobility (R26.89);Difficulty in walking, not elsewhere classified (R26.2);Unsteadiness on feet (R26.81)     Time: 8338-2505 PT Time Calculation (min) (ACUTE ONLY): 36 min  Charges:  $Therapeutic Activity: 8-22 mins $Neuromuscular Re-education: 8-22 mins  Moishe Spice, PT, DPT Acute Rehabilitation Services  Pager: [phone] Office: [place] 06/03/2021, 12:18 PM

## 2021-06-03 NOTE — Progress Notes (Signed)
SLP Cancellation Note  Patient Details Name: Randall Hodges MRN: [id number] DOB: 01/27/1951   Cancelled treatment:       Reason Eval/Treat Not Completed: Patient not medically ready (Pt remains on the vent at this time. SLP will follow up.)  Kashara Blocher I. Hardin Negus, [place], [place] Office number [phone] Pager [place] 06/03/2021, 8:01 AM

## 2021-06-03 NOTE — Progress Notes (Signed)
PHARMACY - PHYSICIAN COMMUNICATION CRITICAL VALUE ALERT - BLOOD CULTURE IDENTIFICATION (BCID)  Randall Hodges is an 71 y.o. male who presented to [facility] on 05/17/2021. Currently on ECMO.  Assessment:  Blood was re-cultured yesterday due to small Levophed requirement. Levophed now off. 1/2 blood with staph epi. Discussed with CCM, will let day team evaluate if they want to start anti-biotics. Currently afebrile.   Name of physician (or Provider) Contacted: Dr. Oletta Darter  Current antibiotics: None  Changes to prescribed antibiotics recommended:  Day team to evaluate need for anti-biotics  Results for orders placed or performed during the hospital encounter of 05/03/2021  Blood Culture ID Panel (Reflexed) (Collected: 06/02/2021  8:11 AM)  Result Value Ref Range   Enterococcus faecalis NOT DETECTED NOT DETECTED   Enterococcus Faecium NOT DETECTED NOT DETECTED   Listeria monocytogenes NOT DETECTED NOT DETECTED   Staphylococcus species DETECTED (A) NOT DETECTED   Staphylococcus aureus (BCID) NOT DETECTED NOT DETECTED   Staphylococcus epidermidis DETECTED (A) NOT DETECTED   Staphylococcus lugdunensis NOT DETECTED NOT DETECTED   Streptococcus species NOT DETECTED NOT DETECTED   Streptococcus agalactiae NOT DETECTED NOT DETECTED   Streptococcus pneumoniae NOT DETECTED NOT DETECTED   Streptococcus pyogenes NOT DETECTED NOT DETECTED   A.calcoaceticus-baumannii NOT DETECTED NOT DETECTED   Bacteroides fragilis NOT DETECTED NOT DETECTED   Enterobacterales NOT DETECTED NOT DETECTED   Enterobacter cloacae complex NOT DETECTED NOT DETECTED   Escherichia coli NOT DETECTED NOT DETECTED   Klebsiella aerogenes NOT DETECTED NOT DETECTED   Klebsiella oxytoca NOT DETECTED NOT DETECTED   Klebsiella pneumoniae NOT DETECTED NOT DETECTED   Proteus species NOT DETECTED NOT DETECTED   Salmonella species NOT DETECTED NOT DETECTED   Serratia marcescens NOT DETECTED NOT DETECTED   Haemophilus influenzae NOT  DETECTED NOT DETECTED   Neisseria meningitidis NOT DETECTED NOT DETECTED   Pseudomonas aeruginosa NOT DETECTED NOT DETECTED   Stenotrophomonas maltophilia NOT DETECTED NOT DETECTED   Candida albicans NOT DETECTED NOT DETECTED   Candida auris NOT DETECTED NOT DETECTED   Candida glabrata NOT DETECTED NOT DETECTED   Candida krusei NOT DETECTED NOT DETECTED   Candida parapsilosis NOT DETECTED NOT DETECTED   Candida tropicalis NOT DETECTED NOT DETECTED   Cryptococcus neoformans/gattii NOT DETECTED NOT DETECTED   Methicillin resistance mecA/C DETECTED (A) NOT DETECTED    Narda Bonds 06/03/2021  2:39 AM

## 2021-06-03 NOTE — Progress Notes (Signed)
Patient ID: Randall Hodges, male   DOB: 1951/04/18, 71 y.o.   MRN: [id number]    Progress Note  Patient Name: Randall Hodges Date of Encounter: 06/03/2021  [facility] Cardiologist: [name], MD   Subjective   2/2: VV ECMO initiation, Crescent catheter right IJ 2/7: Tracheostomy 2/13 CT C/A/P: Diffuse ground-glass opacities consistent with atelectasis and edema presumably related to ECMO physiology and respiratory failure.  Underlying moderate interstitial lung disease mildly progressed from prior CT. 2/15: Oxygenator changed, trach changed.   Sedated this morning on Precedex. Per nursing, wakes up and follows commands.   Amio gtt stopped 2/11 by CCM. Remains in NSR  CXR stable bilateral diffuse disease, L>R. Afebrile, not on abx. MRSE 1/2 blood cultures and trach aspirate.     LDH 615 -> 380 ->-> -> 345 -> 301 -> 321 -> 463 -> 329 -> 339  Vent FiO2 0.6. CVP 13 this morning, had Lasix 40 mg IV x 1 yesterday with I/Os positive and weight up.  Creatinine stable 1.17.   ECMO: Speed 2700 rpm Flow 3.4 L/min pVen -35 DeltaP 18 Sweep 4.5 PTT 55 (goal 50-80) LDH 339 ABG 7.35/53/89/96% Lactate 1.2  Cardiac Studies: Echo (limited, 1/30): Echo reviewed, there is clear respirophasic variation of the interventricular septum and marked respirophasic variation of E inflow velocity on doppler evaluation of the mitral valve.  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  LV EF 45-50%.   RHC Procedural Findings (on norepinephrine 6): Hemodynamics (mmHg) RA mean 12 RV 37/12 PA 38/16, mean 27 PCWP mean 11 LV 108/12 AO 96/55 PAPI 1.8 Oxygen saturations: PA 54% AO 94% Cardiac Output (Fick) 5.59  Cardiac Index (Fick) 2.81  PVR 2.8 WU Simultaneous RV/LV tracings were obtained.  Difficult to  interpret due to atrial fibrillation.  There was some suggestion of discordance (ventricular interdependence) but not clear.  Inpatient Medications    Scheduled Meds:  sodium chloride   Intravenous Once   aspirin  81 mg Per Tube Daily   baclofen  5 mg Per Tube TID   chlorhexidine gluconate (MEDLINE KIT)  15 mL Mouth Rinse BID   Chlorhexidine Gluconate Cloth  6 each Topical Daily   clonazePAM  0.25 mg Per Tube QHS   colchicine  0.6 mg Per Tube Daily   docusate  100 mg Per Tube BID   feeding supplement (PROSource TF)  90 mL Per Tube BID   fiber  1 packet Per Tube TID   free water  200 mL Per Tube Q2H   gabapentin  300 mg Per Tube QHS   Gerhardt's butt cream   Topical TID   guaiFENesin  30 mL Per Tube QID   insulin aspart  0-15 Units Subcutaneous Q4H   mouth rinse  15 mL Mouth Rinse 10 times per day   melatonin  5 mg Per Tube QHS   methylPREDNISolone (SOLU-MEDROL) injection  40 mg Intravenous Q24H   Followed by   Derrill Memo ON 06/05/2021] methylPREDNISolone (SOLU-MEDROL) injection  20 mg Intravenous Q24H   pantoprazole sodium  40 mg Per Tube BID   polyethylene glycol  17 g Per Tube Daily   rosuvastatin  10 mg Per Tube Daily   sodium chloride flush  10-40 mL Intracatheter Q12H   sodium chloride flush  10-40 mL Intracatheter Q12H   sodium chloride flush  3 mL Intravenous Q12H  Continuous Infusions:  sodium chloride Stopped (06/01/21 1121)   sodium chloride 10 mL/hr at 06/03/21 0700   sodium chloride 10 mL/hr at 06/03/21 0700   albumin human 20 mL/hr at 06/03/21 0700   albumin human Stopped (05/30/21 0800)   bivalirudin (ANGIOMAX) infusion 0.5 mg/mL (Non-ACS indications) 0.065 mg/kg/hr (06/03/21 0700)   dexmedetomidine (PRECEDEX) IV infusion 0.8 mcg/kg/hr (06/03/21 0701)   feeding supplement (VITAL 1.5 CAL) 1,000 mL (06/03/21 0014)   fentaNYL infusion INTRAVENOUS 125 mcg/hr (06/03/21 0700)   norepinephrine (LEVOPHED) Adult infusion Stopped (06/02/21 1835)   PRN Meds: Place/Maintain  arterial line **AND** sodium chloride, sodium chloride, sodium chloride, acetaminophen (TYLENOL) oral liquid 160 mg/5 mL, albumin human, albumin human, docusate, fentaNYL, fentaNYL (SUBLIMAZE) injection, fentaNYL (SUBLIMAZE) injection, levalbuterol, ondansetron (ZOFRAN) IV, oxyCODONE, polyvinyl alcohol, sennosides, sodium chloride flush   Vital Signs    Vitals:   06/03/21 0600 06/03/21 0615 06/03/21 0630 06/03/21 0645  BP: 135/69 106/71 123/68 (!) 148/84  Pulse: 75 73 71 79  Resp: _0 Temp:      TempSrc:      SpO2: 99% 99% 99% 96%  Weight:      Height:        Intake/Output Summary (Last 24 hours) at 06/03/2021 0734 Last data filed at 06/03/2021 0700 Gross per 24 hour  Intake 5472.89 ml  Output 4075 ml  Net 1397.89 ml   Last 3 Weights 06/03/2021 06/02/2021 06/01/2021  Weight (lbs) 193 lb 9 oz 188 lb 11.4 oz 189 lb 9.5 oz  Weight (kg) 87.8 kg 85.6 kg 86 kg      Telemetry    NSR 70-80s (personally reviewed)  Physical Exam   General: Sedated on vent Neck: RIJ Crescent catheter, no thyromegaly or thyroid nodule.  Lungs: Crackles bilaterally.  CV: Nondisplaced PMI.  Heart regular S1/S2, no S3/S4, no murmur.  No peripheral edema.   Abdomen: Soft, nontender, no hepatosplenomegaly, no distention.  Skin: Intact without lesions or rashes.  Neurologic: Will follow commands when awakens.  Extremities: No clubbing or cyanosis.  HEENT: Normal.    Labs    High Sensitivity Troponin:   Recent Labs  Lab 05/03/2021 1321 05/03/2021 1518  TROPONINIHS 3 4     Chemistry Recent Labs  Lab 06/01/21 0350 06/01/21 0353 06/02/21 0345 06/02/21 0539 06/02/21 1620 06/02/21 1949 06/03/21 0342 06/03/21 0347 06/03/21 0607  NA 150*   < > 151*   < > 150*   < > 151* 151* 150*  K 4.6   < > 4.3   < > 4.1   < > 4.6 4.5 4.4  CL 113*   < > 115*  --  115*  --  115*  --   --   CO2 30   < > 30  --  28  --  29  --   --   GLUCOSE 168*   < > 148*  --  132*  --  176*  --   --   BUN 87*   < > 85*   --  80*  --  77*  --   --   CREATININE 1.29*   < > 1.25*  --  1.28*  --  1.17  --   --   CALCIUM 8.0*   < > 7.9*  --  7.8*  --  7.9*  --   --   MG 2.4  --  2.4  --   --   --   --   --   --  PROT 4.2*  --  4.2*  --   --   --  4.4*  --   --   ALBUMIN 2.4*  --  2.6*  --   --   --  2.6*  --   --   AST 233*  --  98*  --   --   --  79*  --   --   ALT 179*  --  161*  --   --   --  142*  --   --   ALKPHOS 177*  --  137*  --   --   --  132*  --   --   BILITOT 1.6*  --  1.0  --   --   --  1.0  --   --   GFRNONAA 59*   < > >60  --  60*  --  >60  --   --   ANIONGAP 7   < > 6  --  7  --  7  --   --    < > = values in this interval not displayed.    Lipids  No results for input(s): CHOL, TRIG, HDL, LABVLDL, LDLCALC, CHOLHDL in the last 168 hours.   Hematology Recent Labs  Lab 06/02/21 0345 06/02/21 0539 06/02/21 1620 06/02/21 1949 06/03/21 0342 06/03/21 0347 06/03/21 0607  WBC 14.8*  --  12.0*  --  12.3*  --   --   RBC 2.98*  --  3.34*  --  3.42*  --   --   HGB 8.8*   < > 9.8*   < > 10.2* 9.9* 9.9*  HCT 27.3*   < > 29.8*   < > 31.3* 29.0* 29.0*  MCV 91.6  --  89.2  --  91.5  --   --   MCH 29.5  --  29.3  --  29.8  --   --   MCHC 32.2  --  32.9  --  32.6  --   --   RDW 17.1*  --  16.4*  --  17.2*  --   --   PLT 167  --  135*  --  150  --   --    < > = values in this interval not displayed.   Thyroid No results for input(s): TSH, FREET4 in the last 168 hours.  BNPNo results for input(s): BNP, PROBNP in the last 168 hours.  DDimer No results for input(s): DDIMER in the last 168 hours.   Radiology    DG CHEST PORT 1 VIEW  Result Date: 06/02/2021 CLINICAL DATA:  Tracheostomy.  ECMO device. EXAM: PORTABLE CHEST 1 VIEW COMPARISON:  June 01, 2021. FINDINGS: Stable cardiomegaly. Tracheostomy tube is in good position. Feeding tube is seen entering stomach. ECMO device is unchanged. Stable bilateral lung opacities are noted consistent with ARDS. Bony thorax is unremarkable. IMPRESSION:  Stable support apparatus. Stable bilateral lung opacities as described above. Electronically Signed   By: Marijo Conception M.D.   On: 06/02/2021 08:24   ECHOCARDIOGRAM LIMITED  Result Date: 06/02/2021    ECHOCARDIOGRAM LIMITED REPORT   Patient Name:   Randall Hodges Date of Exam: 06/02/2021 Medical Rec #:  [id number]       Height:       70.0 in Accession #:    [id number]      Weight:       188.7 lb Date of Birth:  [birth year]  BSA:          2.036 m Patient Age:    60 years        BP:           145/69 mmHg Patient Gender: M               HR:           77 bpm. Exam Location:  Inpatient Procedure: Limited Echo, Cardiac Doppler and Limited Color Doppler Indications:    ECMO  History:        Patient has prior history of Echocardiogram examinations, most                 recent 06/11/2021.  Sonographer:    Arlyss Gandy Referring Phys: [place]  1. Significant variation in septal movement, presumed to be respiratory although not monitored during the study. No evaluation of MV inflow. No significant pericardial effusion on this study, has improved since 05/01/2021. IVC is collpasing, which goes against significant pericardial constriction. RA ECMO catheter seen in the RA, well positioned. EF unchanged ~40-45%. Left ventricular ejection fraction, by estimation, is 40 to 45%. The left ventricle has mildly decreased function. The left ventricle demonstrates regional wall motion abnormalities (see scoring diagram/findings for description).  2. Right ventricular systolic function is moderately reduced. The right ventricular size is normal. There is mildly elevated pulmonary artery systolic pressure. The estimated right ventricular systolic pressure is 24.2 mmHg.  3. The mitral valve is grossly normal. Trivial mitral valve regurgitation. No evidence of mitral stenosis.  4. Tricuspid valve regurgitation is mild to moderate.  5. The aortic valve is tricuspid.  6. The inferior vena cava is normal in size  with greater than 50% respiratory variability, suggesting right atrial pressure of 3 mmHg. Comparison(s): No significant change from prior study. FINDINGS  Left Ventricle: Significant variation in septal movement, presumed to be respiratory although not monitored during the study. No evaluation of MV inflow. No significant pericardial effusion on this study, has improved since 04/29/2021. IVC is collpasing,  which goes against significant pericardial constriction. RA ECMO catheter seen in the RA, well positioned. EF unchanged ~40-45%. Left ventricular ejection fraction, by estimation, is 40 to 45%. The left ventricle has mildly decreased function. The left ventricle demonstrates regional wall motion abnormalities. The left ventricular internal cavity size was normal in size. There is no left ventricular hypertrophy.  LV Wall Scoring: The antero-lateral wall and posterior wall are akinetic. Right Ventricle: The right ventricular size is normal. No increase in right ventricular wall thickness. Right ventricular systolic function is moderately reduced. There is mildly elevated pulmonary artery systolic pressure. The tricuspid regurgitant velocity is 2.76 m/s, and with an assumed right atrial pressure of 8 mmHg, the estimated right ventricular systolic pressure is 68.3 mmHg. Pericardium: Trivial pericardial effusion is present. Mitral Valve: The mitral valve is grossly normal. Trivial mitral valve regurgitation. No evidence of mitral valve stenosis. Tricuspid Valve: The tricuspid valve is grossly normal. Tricuspid valve regurgitation is mild to moderate. No evidence of tricuspid stenosis. Aortic Valve: The aortic valve is tricuspid. Venous: The inferior vena cava is normal in size with greater than 50% respiratory variability, suggesting right atrial pressure of 3 mmHg. LEFT VENTRICLE PLAX 2D LVIDd:         3.70 cm LVIDs:         2.90 cm LV PW:         1.10 cm LV IVS:  1.10 cm  TRICUSPID VALVE TR Peak grad:   30.5  mmHg TR Vmax:        276.00 cm/s Eleonore Chiquito MD Electronically signed by Eleonore Chiquito MD Signature Date/Time: 06/02/2021/8:48:35 AM    Final     Cardiac Studies   Cath 04/26/2021 Distal left main [place] 111 bifurcation stenosis with 75% left main, 90% ostial to proximal LAD, and 80-90% ostial circumflex (difficult to assess due to heavy calcification). Severe mid circumflex disease with 70% eccentric mid stenosis and second obtuse marginal containing ostial to proximal greater than 80% stenosis.  (Bifurcation [place] 111 Severe calcification in left main and LAD in particular with diffuse 50% narrowing from proximal to mid vessel and tandem 70% stenoses in the mid LAD. Nondominant right coronary Normal LV function.  EF 55%.  LVEDP normal.    Patient Profile     71 y.o. male with PMH of PVCs presented with chest pain. Cardiac cath by Dr. Tamala Julian on 04/19/2021 showed 75% left main, 90% ost to prox LAD, 80-90% ost LCx, 70% mid LCx, 80% OM2, 50% prox to mid LAD, 70% mid LAD, EF 55%. Patient underwent CABG x 3 on 05/01/2021. Post op course complicated afib, treated with amio. CXR showed opacity in bilateral lung, started abx on 1/25 and diuretic. Started on Eliquis due to recurrence of afib.    Assessment & Plan    1. CAD: Admitted with unstable angina, cath with severe left main and proximal LAD/LCx disease (nondominant RCA).  CABG x 3 on 1/18 with LIMA-LAD, SVG-OM, SVG-left PDA.  No s/s angina  - Continue ASA 81, statin.  2. 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 RHC 1/30 with equalization of diastolic pressures.   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  Repeated echo 2/2 still showed respirophasic septal variation but not as impressive. CVP 13 today.  Lungs still with diffuse bilateral infiltrates on CXR. Weight up.   - Give Lasix 40 mg IV bid today with albumen if needed.  - With concern for development of post-surgical effusive/constrictive pericarditis, he is on colchicine.  - ?need for surgical intervention on effusive/constrictive pericarditis. Will need improvement of lung disease first then reassess, repeat echo not as impressive.   3. 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  Suspect primarily septic/vasodilatory shock. He is off pressors.  4. Hiccups: Still present.  - On baclofen.  - Gabapentin started 5. PNA with acute hypoxic respiratory failure: Of note, he does appear to have had some pre-existing ILD from 2019 CT chest (?sarcoidosis). CXR with persistent bilateral infiltrates, possible mild improvement compared to yesterday.  Have thought most likely aspiration PNA/pneumonitis in setting of intractable hiccups. He has developred ARDS.  Afebrile. COVID was negative.  FiO2 0.4 on vent.  We have struggled to wean sweep, now back to 5. CXR similar with bilateral diffuse airspace disease. Has tracheostomy. CT chest 2/9 with persistent lung infiltrates.  Completed vancomycin and meropenem => MRSE 1/2 blood and trach aspirate, holding off on treatment per CCM. LDH stable today.  Still requiring sweep 4.5.  Oxygenator changed out on 2/14, ABG better today.    - Discussed with pulmonary, think amiodarone toxicity unlikely  as he has only been on it post-op. However they have asked to stop for now.  - Vent and sedation per CCM.  - Solumedrol 40 mg IV daily with gradual taper, ESR now normal.  6. Atypical atrial flutter/PVCs:  Remains in NSR - Off amiodarone now.  - Bivalirudin gtt, goal PTT 50-80. Discussed dosing with PharmD  personally. 7. AKI: Creatinine stable 1.1.  Follow closely.  8. Elevated LFTs: Suspect shock liver, follow CMET.  LFTs trending down.  9. Anemia: Transfuse hgb < 8.     10. Hypernatremia: Na 151 today.  - Increase free water to 300 q2.  11. HTN: BP stable today.     CRITICAL CARE Performed by: Loralie Champagne  Total critical care time: 45 minutes  Critical care time was exclusive of separately billable procedures and treating other patients.  Critical care was necessary to treat or prevent imminent or life-threatening deterioration.  Critical care was time spent personally by me on the following activities: development of treatment plan with patient and/or surrogate as well as nursing, discussions with consultants, evaluation of patient's response to treatment, examination of patient, obtaining history from patient or surrogate, ordering and performing treatments and interventions, ordering and review of laboratory studies, ordering and review of radiographic studies, pulse oximetry and re-evaluation of patient's condition.  Loralie Champagne MD 06/03/2021 7:34 AM

## 2021-06-03 NOTE — Progress Notes (Signed)
ANTICOAGULATION CONSULT NOTE  Pharmacy Consult for Bivalirudin Indication:  ECMO  Allergies  Allergen Reactions   Amoxicillin-Pot Clavulanate     Per pt report on 08/19/20, makes his urine dark colored   Atorvastatin Other (See Comments)     ( pt states causing runny nose, headaches, issue w/ urination)   Plant Derived Enzymes     Other reaction(s): Unknown   Trichophyton Other (See Comments)    Patient Measurements: Height: 5\' 10"  (177.8 cm) Weight: 87.8 kg (193 lb 9 oz) IBW/kg (Calculated) : 73  Vital Signs: Temp: 98.1 F (36.7 C) (02/15 0400) Temp Source: Core (02/15 0400) BP: 180/83 (02/15 1100) Pulse Rate: 71 (02/15 1100)  Labs: Recent Labs    06/01/21 0350 06/01/21 0353 06/02/21 0345 06/02/21 0539 06/02/21 1620 06/02/21 1949 06/03/21 0342 06/03/21 0347 06/03/21 0607  HGB 7.2*   < > 8.8*   < > 9.8*   < > 10.2* 9.9* 9.9*  HCT 23.8*   < > 27.3*   < > 29.8*   < > 31.3* 29.0* 29.0*  PLT 182   < > 167  --  135*  --  150  --   --   APTT 59*   < > 60*  --  59*  --  55*  --   --   LABPROT 25.4*  --  25.2*  --   --   --  23.6*  --   --   INR 2.3*  --  2.3*  --   --   --  2.1*  --   --   CREATININE 1.29*   < > 1.25*  --  1.28*  --  1.17  --   --    < > = values in this interval not displayed.     Estimated Creatinine Clearance: 64.6 mL/min (by C-G formula based on SCr of 1.17 mg/dL).   Medical History: Past Medical History:  Diagnosis Date   Allergic rhinitis    Prostate cancer ([place])     Medications:  Infusions:   sodium chloride Stopped (06/01/21 1121)   sodium chloride 10 mL/hr at 06/03/21 1100   sodium chloride 10 mL/hr at 06/03/21 1100   albumin human Stopped (06/03/21 0958)   albumin human Stopped (05/30/21 0800)   bivalirudin (ANGIOMAX) infusion 0.5 mg/mL (Non-ACS indications) 0.065 mg/kg/hr (06/03/21 1100)   dexmedetomidine (PRECEDEX) IV infusion 0.8 mcg/kg/hr (06/03/21 1100)   feeding supplement (VITAL 1.5 CAL) 60 mL/hr at 06/03/21 1100    fentaNYL infusion INTRAVENOUS 75 mcg/hr (06/03/21 1100)   [START ON 06/04/2021] vancomycin      Assessment: 71 year old male with coronary artery disease who initially presented with chest pain, noted to have multivessel coronary artery disease. He underwent CABG x3 on 05/12/2021; course was complicated with atrial fibrillation, frequent hiccups and aspiration pneumonia leading to hypoxia and increasing oxygen requirement. Patient now requiring VV ECMO. Pharmacy consulted for bivalirudin IV.   aPTT 55 seconds on Bivalirudin 0.065mg /kg/hr pltc stable 150s, hgb stable 9s. LDH trending up 300>400> now back down 300 fibrinogen trend up 300>400> back down 339  Noted to have some bleeding from cannula and trach site 2/12 improvement after thrombi pad and surgicel.  Oxygenator and trach changed out 2/14.   Goal of Therapy:  aPTT 50-80 seconds Monitor platelets by anticoagulation protocol: Yes   Plan:  Continue bivalirudin at 0.065 mg/kg/hr Monitor q12h aptt and CBC Monitor closely for s/sx bleeding/thrombus  Erin Hearing PharmD., BCPS Clinical Pharmacist 06/03/2021 12:10 PM

## 2021-06-04 ENCOUNTER — Inpatient Hospital Stay (HOSPITAL_COMMUNITY): Payer: PPO

## 2021-06-04 ENCOUNTER — Encounter (HOSPITAL_COMMUNITY): Payer: Self-pay | Admitting: Cardiothoracic Surgery

## 2021-06-04 DIAGNOSIS — Z9281 Personal history of extracorporeal membrane oxygenation (ECMO): Secondary | ICD-10-CM | POA: Diagnosis not present

## 2021-06-04 DIAGNOSIS — I361 Nonrheumatic tricuspid (valve) insufficiency: Secondary | ICD-10-CM

## 2021-06-04 DIAGNOSIS — Z951 Presence of aortocoronary bypass graft: Secondary | ICD-10-CM | POA: Diagnosis not present

## 2021-06-04 DIAGNOSIS — Z515 Encounter for palliative care: Secondary | ICD-10-CM | POA: Diagnosis not present

## 2021-06-04 DIAGNOSIS — Z978 Presence of other specified devices: Secondary | ICD-10-CM

## 2021-06-04 DIAGNOSIS — J9601 Acute respiratory failure with hypoxia: Secondary | ICD-10-CM | POA: Diagnosis not present

## 2021-06-04 LAB — POCT I-STAT 7, (LYTES, BLD GAS, ICA,H+H)
Acid-Base Excess: 3 mmol/L — ABNORMAL HIGH (ref 0.0–2.0)
Acid-Base Excess: 1 mmol/L (ref 0.0–2.0)
Acid-Base Excess: 1 mmol/L (ref 0.0–2.0)
Acid-Base Excess: 0 mmol/L (ref 0.0–2.0)
Bicarbonate: 27.9 mmol/L (ref 20.0–28.0)
Bicarbonate: 27.3 mmol/L (ref 20.0–28.0)
Bicarbonate: 25.7 mmol/L (ref 20.0–28.0)
Bicarbonate: 25.6 mmol/L (ref 20.0–28.0)
Calcium, Ion: 1.22 mmol/L (ref 1.15–1.40)
Calcium, Ion: 1.23 mmol/L (ref 1.15–1.40)
Calcium, Ion: 1.2 mmol/L (ref 1.15–1.40)
Calcium, Ion: 1.27 mmol/L (ref 1.15–1.40)
HCT: 27 % — ABNORMAL LOW (ref 39.0–52.0)
HCT: 31 % — ABNORMAL LOW (ref 39.0–52.0)
HCT: 30 % — ABNORMAL LOW (ref 39.0–52.0)
HCT: 31 % — ABNORMAL LOW (ref 39.0–52.0)
Hemoglobin: 9.2 g/dL — ABNORMAL LOW (ref 13.0–17.0)
Hemoglobin: 10.5 g/dL — ABNORMAL LOW (ref 13.0–17.0)
Hemoglobin: 10.2 g/dL — ABNORMAL LOW (ref 13.0–17.0)
Hemoglobin: 10.5 g/dL — ABNORMAL LOW (ref 13.0–17.0)
O2 Saturation: 93 %
O2 Saturation: 84 %
O2 Saturation: 91 %
O2 Saturation: 91 %
Patient temperature: 36.6
Patient temperature: 36.6
Patient temperature: 36.7
Patient temperature: 36.6
Potassium: 4.2 mmol/L (ref 3.5–5.1)
Potassium: 4.2 mmol/L (ref 3.5–5.1)
Potassium: 4.1 mmol/L (ref 3.5–5.1)
Potassium: 4 mmol/L (ref 3.5–5.1)
Sodium: 145 mmol/L (ref 135–145)
Sodium: 147 mmol/L — ABNORMAL HIGH (ref 135–145)
Sodium: 146 mmol/L — ABNORMAL HIGH (ref 135–145)
Sodium: 145 mmol/L (ref 135–145)
TCO2: 29 mmol/L (ref 22–32)
TCO2: 29 mmol/L (ref 22–32)
TCO2: 27 mmol/L (ref 22–32)
TCO2: 27 mmol/L (ref 22–32)
pCO2 arterial: 43.1 mmHg (ref 32–48)
pCO2 arterial: 48.6 mmHg — ABNORMAL HIGH (ref 32–48)
pCO2 arterial: 41.9 mmHg (ref 32–48)
pCO2 arterial: 45.4 mmHg (ref 32–48)
pH, Arterial: 7.419 (ref 7.35–7.45)
pH, Arterial: 7.356 (ref 7.35–7.45)
pH, Arterial: 7.395 (ref 7.35–7.45)
pH, Arterial: 7.358 (ref 7.35–7.45)
pO2, Arterial: 65 mmHg — ABNORMAL LOW (ref 83–108)
pO2, Arterial: 51 mmHg — ABNORMAL LOW (ref 83–108)
pO2, Arterial: 62 mmHg — ABNORMAL LOW (ref 83–108)
pO2, Arterial: 62 mmHg — ABNORMAL LOW (ref 83–108)

## 2021-06-04 LAB — CBC
HCT: 28.3 % — ABNORMAL LOW (ref 39.0–52.0)
HCT: 30.9 % — ABNORMAL LOW (ref 39.0–52.0)
Hemoglobin: 9.2 g/dL — ABNORMAL LOW (ref 13.0–17.0)
Hemoglobin: 9.8 g/dL — ABNORMAL LOW (ref 13.0–17.0)
MCH: 29.9 pg (ref 26.0–34.0)
MCH: 29.3 pg (ref 26.0–34.0)
MCHC: 32.5 g/dL (ref 30.0–36.0)
MCHC: 31.7 g/dL (ref 30.0–36.0)
MCV: 91.9 fL (ref 80.0–100.0)
MCV: 92.5 fL (ref 80.0–100.0)
Platelets: 136 10*3/uL — ABNORMAL LOW (ref 150–400)
Platelets: 126 10*3/uL — ABNORMAL LOW (ref 150–400)
RBC: 3.08 MIL/uL — ABNORMAL LOW (ref 4.22–5.81)
RBC: 3.34 MIL/uL — ABNORMAL LOW (ref 4.22–5.81)
RDW: 17 % — ABNORMAL HIGH (ref 11.5–15.5)
RDW: 17.2 % — ABNORMAL HIGH (ref 11.5–15.5)
WBC: 10.7 10*3/uL — ABNORMAL HIGH (ref 4.0–10.5)
WBC: 10.5 10*3/uL (ref 4.0–10.5)
nRBC: 0 % (ref 0.0–0.2)
nRBC: 0 % (ref 0.0–0.2)

## 2021-06-04 LAB — GLUCOSE, CAPILLARY
Glucose-Capillary: 151 mg/dL — ABNORMAL HIGH (ref 70–99)
Glucose-Capillary: 114 mg/dL — ABNORMAL HIGH (ref 70–99)
Glucose-Capillary: 160 mg/dL — ABNORMAL HIGH (ref 70–99)
Glucose-Capillary: 127 mg/dL — ABNORMAL HIGH (ref 70–99)
Glucose-Capillary: 135 mg/dL — ABNORMAL HIGH (ref 70–99)
Glucose-Capillary: 155 mg/dL — ABNORMAL HIGH (ref 70–99)

## 2021-06-04 LAB — BASIC METABOLIC PANEL
Anion gap: 6 (ref 5–15)
Anion gap: 6 (ref 5–15)
BUN: 71 mg/dL — ABNORMAL HIGH (ref 8–23)
BUN: 69 mg/dL — ABNORMAL HIGH (ref 8–23)
CO2: 27 mmol/L (ref 22–32)
CO2: 26 mmol/L (ref 22–32)
Calcium: 7.7 mg/dL — ABNORMAL LOW (ref 8.9–10.3)
Calcium: 7.7 mg/dL — ABNORMAL LOW (ref 8.9–10.3)
Chloride: 112 mmol/L — ABNORMAL HIGH (ref 98–111)
Chloride: 112 mmol/L — ABNORMAL HIGH (ref 98–111)
Creatinine, Ser: 1.11 mg/dL (ref 0.61–1.24)
Creatinine, Ser: 1.03 mg/dL (ref 0.61–1.24)
GFR, Estimated: >60 mL/min (ref >60)
GFR, Estimated: >60 mL/min (ref >60)
Glucose, Bld: 166 mg/dL — ABNORMAL HIGH (ref 70–99)
Glucose, Bld: 136 mg/dL — ABNORMAL HIGH (ref 70–99)
Potassium: 4.2 mmol/L (ref 3.5–5.1)
Potassium: 4 mmol/L (ref 3.5–5.1)
Sodium: 145 mmol/L (ref 135–145)
Sodium: 144 mmol/L (ref 135–145)

## 2021-06-04 LAB — HEPATIC FUNCTION PANEL
ALT: 123 U/L — ABNORMAL HIGH (ref 0–44)
AST: 61 U/L — ABNORMAL HIGH (ref 15–41)
Albumin: 2.3 g/dL — ABNORMAL LOW (ref 3.5–5.0)
Alkaline Phosphatase: 101 U/L (ref 38–126)
Bilirubin, Direct: 0.2 mg/dL (ref 0.0–0.2)
Indirect Bilirubin: 0.6 mg/dL (ref 0.3–0.9)
Total Bilirubin: 0.8 mg/dL (ref 0.3–1.2)
Total Protein: 4 g/dL — ABNORMAL LOW (ref 6.5–8.1)

## 2021-06-04 LAB — CULTURE, RESPIRATORY W GRAM STAIN: Culture: NORMAL

## 2021-06-04 LAB — APTT
aPTT: 61 seconds — ABNORMAL HIGH (ref 24–36)
aPTT: 64 seconds — ABNORMAL HIGH (ref 24–36)

## 2021-06-04 LAB — LACTATE DEHYDROGENASE: LDH: 257 U/L — ABNORMAL HIGH (ref 98–192)

## 2021-06-04 LAB — FIBRINOGEN: Fibrinogen: 327 mg/dL (ref 210–475)

## 2021-06-04 LAB — PREPARE RBC (CROSSMATCH)

## 2021-06-04 LAB — PROTIME-INR
INR: 2.1 — ABNORMAL HIGH (ref 0.8–1.2)
Prothrombin Time: 23.9 seconds — ABNORMAL HIGH (ref 11.4–15.2)

## 2021-06-04 LAB — LACTIC ACID, PLASMA: Lactic Acid, Venous: 0.9 mmol/L (ref 0.5–1.9)

## 2021-06-04 MED ORDER — MORPHINE SULFATE 15 MG PO TABS
30.0000 mg | ORAL_TABLET | Freq: Four times a day (QID) | ORAL | Status: DC
  Administered 2021-06-04 – 2021-06-12 (×33): 30 mg
  Filled 2021-06-04 (×33): qty 2

## 2021-06-04 MED ORDER — FUROSEMIDE 10 MG/ML IJ SOLN: 40.0000 mg | Freq: Two times a day (BID) | INTRAMUSCULAR | Status: DC

## 2021-06-04 MED ORDER — ATROPINE SULFATE 1 MG/10ML IJ SOSY
PREFILLED_SYRINGE | INTRAMUSCULAR | Status: AC
Start: 2021-06-04 — End: 2021-06-04
  Filled 2021-06-04: qty 10

## 2021-06-04 MED ORDER — METOCLOPRAMIDE HCL 5 MG/ML IJ SOLN
5.0000 mg | Freq: Three times a day (TID) | INTRAMUSCULAR | Status: DC
  Administered 2021-06-04 – 2021-06-17 (×40): 5 mg via INTRAVENOUS
  Filled 2021-06-04 (×40): qty 2

## 2021-06-04 MED ORDER — CLONAZEPAM 0.5 MG PO TBDP
1.0000 mg | ORAL_TABLET | Freq: Two times a day (BID) | ORAL | Status: DC
  Administered 2021-06-04 – 2021-06-13 (×18): 1 mg
  Filled 2021-06-04 (×18): qty 2

## 2021-06-04 MED ORDER — SODIUM CHLORIDE 0.9% IV SOLUTION: Freq: Once | INTRAVENOUS | Status: AC

## 2021-06-04 MED ORDER — MIDAZOLAM HCL 2 MG/2ML IJ SOLN
2.0000 mg | Freq: Once | INTRAMUSCULAR | Status: AC
  Administered 2021-06-04: 2 mg via INTRAVENOUS
  Filled 2021-06-04: qty 2

## 2021-06-04 NOTE — Progress Notes (Addendum)
2352 - patient upon changing of inner trach cannula, had bradycardic episode into 30's, then quickly went Asystole. After one compression of CPR patient rhythm came back into HR of 50-60's, SBP returned and no code procedure's were needed.   2355 - Called by Dr. Haroldine Laws regarding patient Asystole. Orders received to HOLD coreg in the a.m. and to hold off on big turns/ movement until the morning

## 2021-06-04 NOTE — Progress Notes (Addendum)
OT Cancellation Note  Patient Details Name: Randall Hodges MRN: [id number] DOB: 02/09/1951   Cancelled Treatment:    Reason Eval/Treat Not Completed: Medical issues which prohibited therapy. RN requests hold as pt with drainage from trach and planning for bronchoscopy. Will return as patient is medically stable.  [place], OTR/L Acute Rehab Pager: [phone] Office: [phone] 06/04/2021, 1:48 PM

## 2021-06-04 NOTE — Progress Notes (Signed)
ANTICOAGULATION CONSULT NOTE  Pharmacy Consult for Bivalirudin Indication:  ECMO  Allergies  Allergen Reactions   Amoxicillin-Pot Clavulanate     Per pt report on 08/19/20, makes his urine dark colored   Atorvastatin Other (See Comments)     ( pt states causing runny nose, headaches, issue w/ urination)   Plant Derived Enzymes     Other reaction(s): Unknown   Trichophyton Other (See Comments)    Patient Measurements: Height: 5\' 10"  (177.8 cm) Weight: 86.3 kg (190 lb 4.1 oz) IBW/kg (Calculated) : 73  Vital Signs: Temp: 98 F (36.7 C) (02/16 0300) Temp Source: Axillary (02/16 0300) BP: 176/78 (02/16 0700) Pulse Rate: 80 (02/16 0700)  Labs: Recent Labs    06/02/21 0345 06/02/21 0539 06/03/21 0342 06/03/21 0347 06/03/21 1611 06/04/21 0244 06/04/21 0311 06/04/21 0805  HGB 8.8*   < > 10.2*   < > 9.2* 9.2* 9.2* 10.5*  HCT 27.3*   < > 31.3*   < > 29.5* 28.3* 27.0* 31.0*  PLT 167   < > 150  --  146* 136*  --   --   APTT 60*   < > 55*  --  60* 61*  --   --   LABPROT 25.2*  --  23.6*  --   --  23.9*  --   --   INR 2.3*  --  2.1*  --   --  2.1*  --   --   CREATININE 1.25*   < > 1.17  --  1.10 1.11  --   --    < > = values in this interval not displayed.     Estimated Creatinine Clearance: 63 mL/min (by C-G formula based on SCr of 1.11 mg/dL).   Medical History: Past Medical History:  Diagnosis Date   Allergic rhinitis    Prostate cancer ([place])     Medications:  Infusions:   sodium chloride Stopped (06/01/21 1121)   sodium chloride 10 mL/hr at 06/04/21 0800   sodium chloride 10 mL/hr at 06/04/21 0800   albumin human Stopped (06/04/21 0747)   albumin human 12.5 g (06/04/21 0809)   bivalirudin (ANGIOMAX) infusion 0.5 mg/mL (Non-ACS indications) 0.065 mg/kg/hr (06/04/21 0700)   dexmedetomidine (PRECEDEX) IV infusion 1 mcg/kg/hr (06/04/21 0800)   feeding supplement (VITAL 1.5 CAL) Stopped (06/04/21 0735)   fentaNYL infusion INTRAVENOUS 125 mcg/hr (06/04/21 0800)    vancomycin      Assessment: 71 year old male with coronary artery disease who initially presented with chest pain, noted to have multivessel coronary artery disease. He underwent CABG x3 on 04/23/2021; course was complicated with atrial fibrillation, frequent hiccups and aspiration pneumonia leading to hypoxia and increasing oxygen requirement. Patient now requiring VV ECMO. Pharmacy consulted for bivalirudin IV.   aPTT 61 seconds on Bivalirudin 0.065mg /kg/hr pltc stable 130-150s, hgb stable 9s. LDH trended up 300>400> now back down 300 fibrinogen trended up 300>400> back down 300s  Noted to have some bleeding from cannula and trach site 2/12 improvement after thrombi pad and surgicel.  Oxygenator and trach changed out 2/14.    Goal of Therapy:  aPTT 50-80 seconds Monitor platelets by anticoagulation protocol: Yes   Plan:  Continue bivalirudin at 0.065 mg/kg/hr Monitor q12h aptt and CBC Monitor closely for s/sx bleeding/thrombus    Bonnita Nasuti Pharm.D. CPP, BCPS Clinical Pharmacist [phone] 06/04/2021 8:38 AM

## 2021-06-04 NOTE — Progress Notes (Signed)
Nutrition Follow-up  DOCUMENTATION CODES:   Not applicable  INTERVENTION:   Tube feeding via post pyloric Cortrak tube-tip at LOT: Vital 1.5 at 60 ml/h (1440 ml per day) Increase Prosource TF to 90 ml BID Provides 2320 kcal, 141 gm protein, 1094 ml free water daily   300 ml free water every 2 hours Total free water: 4.7 L  NUTRITION DIAGNOSIS:   Inadequate oral intake related to inability to eat as evidenced by NPO status.  Progressing  GOAL:   Patient will meet greater than or equal to 90% of their needs  Met  MONITOR:   TF tolerance, Diet advancement, Labs, Weight trends  REASON FOR ASSESSMENT:   Rounds    ASSESSMENT:   71 yo male admitted with acute coronary syndrome, underwent CABG x 3 on 1/18; developed intractable hiccups/muscle spasms, aspiration pneumonia with respiratory failure. PMH includes prostate cancer  1/18 CABG x 3 1/29 Transferred back to ICU due to hypotension, worsening respiratory status 1/30 Cortrak placed-tip near LOT, TF initiated 2/02 Intubated, VV ECMO cannulation for ARDS 2/07 Trach placed  Pt remains on vent support via Trach, VV ECMO day 13 Some concern regarding color of secretions from trach this AM; RN reports concern that this was possibly TF and TF placed on hold.   Abd xray this AM indicating Cortrak tube tip at LOT; excellent post pyloric placement. TF resumed.   Sodium has trended down with free water flushes; current free water flushes of 300 mL q 2 hours per MD UOP 3.7 L in 24 hours. Received lasix yesterday; on hold currently  Rectal tube remains in place but stool volume decreased; bowel regimen prn. Nutrisource Fiber TID  No new skin breakdown  Reglan started today for reflux, hiccups  Labs: sodium 147 (H) Meds: ss novolog, solumedrol, reglan   Diet Order:   Diet Order             Diet NPO time specified  Diet effective midnight                   EDUCATION NEEDS:   Education needs have been  addressed  Skin:  Skin Assessment: Skin Integrity Issues: Skin Integrity Issues:: Stage II Stage II: nose d/t BiPap mask Incisions: sternum, leg, groin  Last BM:  200 mL x 24 hours via rectal tube  Height:   Ht Readings from Last 1 Encounters:  06/01/21 _0  (1.778 m)    Weight:   Wt Readings from Last 1 Encounters:  06/04/21 86.3 kg     BMI:  Body mass index is 27.3 kg/m.  Estimated Nutritional Needs:   Kcal:  2000-2200 kcals  Protein:  125-150 g  Fluid:  1.8 L   Kerman Passey MS, RDN, LDN, CNSC Registered Dietitian III Clinical Nutrition RD Pager and On-Call Pager Number Located in [place]

## 2021-06-04 NOTE — Procedures (Signed)
Bronchoscopy Procedure Note  LAIDEN MILLES  [id number]  08-17-1950  Date:06/04/21  Time:2:35 PM   Provider Performing:Carreen Milius   Procedure(s):  Flexible bronchoscopy with bronchial alveolar lavage (36144)  Indication(s)  Bilious drainage from tracheostomy site following cough.   Consent Risks of the procedure as well as the alternatives and risks of each were explained to the patient and/or caregiver.  Consent for the procedure was obtained and is signed in the bedside chart  Anesthesia Fentanyl and versed   Time Out Verified patient identification, verified procedure, site/side was marked, verified correct patient position, special equipment/implants available, medications/allergies/relevant history reviewed, required imaging and test results available.   Sterile Technique Usual hand hygiene, masks, gowns, and gloves were used   Procedure Description Bronchoscope advanced through tracheostomy tube and into airway.  Airways were examined down to subsegmental level with findings noted below.   Following diagnostic evaluation, BAL(s) performed in LLL with normal saline and return of 20 fluid  Findings: no bile staining of airways, moderate white secretions in both lungs. BAL LLL. + bile staining of tongue.     Complications/Tolerance None; patient tolerated the procedure well. Chest X-ray is not needed post procedure.   EBL none   Specimen(s) BAL for culture.  Kipp Brood, MD [facility] ICU Physician [place]  Pager: [phone] Or Epic Secure Chat After hours: [phone].  06/04/2021, 2:38 PM

## 2021-06-04 NOTE — Plan of Care (Signed)
°  Problem: Clinical Measurements: Goal: Ability to maintain clinical measurements within normal limits will improve Outcome: Progressing   Problem: Nutrition: Goal: Adequate nutrition will be maintained Outcome: Progressing   Problem: Elimination: Goal: Will not experience complications related to bowel motility Outcome: Progressing Goal: Will not experience complications related to urinary retention Outcome: Progressing   Problem: Pain Managment: Goal: General experience of comfort will improve Outcome: Progressing   Problem: Skin Integrity: Goal: Risk for impaired skin integrity will decrease Outcome: Progressing

## 2021-06-04 NOTE — Progress Notes (Signed)
Patient ID: [name], male   DOB: [birth year], 71 y.o.   MRN: [id number] Extracorporeal support note  ECLS support day: 13 Indication: ARDS  Configuration: VV  Drainage cannula: Crescent R IJ Return cannula: Crescent R IJ  Pump speed: 2700 rpm Pump flow: 3.5 L/min Pump used: Cardiohelp  Sweep gas: 6    Circuit check: Small amount thrombus. Good color change. LDH stable 257 Anticoagulant: Bivalirudin, PTT goal 50-80. PTT 61  Changes in support: No changes today. Lung rest settings on vent.   Anticipated goals/duration of support: Wean to discontinuation  Randall Champagne, Randall Hodges  7:49 AM

## 2021-06-04 NOTE — Progress Notes (Addendum)
ANTICOAGULATION CONSULT NOTE  Pharmacy Consult for Bivalirudin Indication:  ECMO  Allergies  Allergen Reactions   Amoxicillin-Pot Clavulanate     Per pt report on 08/19/20, makes his urine dark colored   Atorvastatin Other (See Comments)     ( pt states causing runny nose, headaches, issue w/ urination)   Plant Derived Enzymes     Other reaction(s): Unknown   Trichophyton Other (See Comments)    Patient Measurements: Height: 5\' 10"  (177.8 cm) Weight: 86.3 kg (190 lb 4.1 oz) IBW/kg (Calculated) : 73  Vital Signs: Temp: 97.2 F (36.2 C) (02/16 1152) Temp Source: Axillary (02/16 1152) BP: 111/57 (02/16 1730) Pulse Rate: 58 (02/16 1730)  Labs: Recent Labs    06/02/21 0345 06/02/21 0539 06/03/21 0342 06/03/21 0347 06/03/21 1611 06/04/21 0244 06/04/21 0311 06/04/21 1606 06/04/21 1612 06/04/21 1720  HGB 8.8*   < > 10.2*   < > 9.2* 9.2*   < > 9.8* 10.2* 10.5*  HCT 27.3*   < > 31.3*   < > 29.5* 28.3*   < > 30.9* 30.0* 31.0*  PLT 167   < > 150  --  146* 136*  --  126*  --   --   APTT 60*   < > 55*  --  60* 61*  --  64*  --   --   LABPROT 25.2*  --  23.6*  --   --  23.9*  --   --   --   --   INR 2.3*  --  2.1*  --   --  2.1*  --   --   --   --   CREATININE 1.25*   < > 1.17  --  1.10 1.11  --  1.03  --   --    < > = values in this interval not displayed.     Estimated Creatinine Clearance: 67.9 mL/min (by C-G formula based on SCr of 1.03 mg/dL).   Medical History: Past Medical History:  Diagnosis Date   Allergic rhinitis    Prostate cancer ([place])     Medications:  Infusions:   sodium chloride Stopped (06/01/21 1121)   sodium chloride 10 mL/hr at 06/04/21 1300   sodium chloride 10 mL/hr at 06/04/21 1300   albumin human Stopped (06/04/21 0747)   albumin human Stopped (06/04/21 0957)   bivalirudin (ANGIOMAX) infusion 0.5 mg/mL (Non-ACS indications) 0.065 mg/kg/hr (06/04/21 0700)   dexmedetomidine (PRECEDEX) IV infusion 0.7 mcg/kg/hr (06/04/21 1703)   feeding  supplement (VITAL 1.5 CAL) 1,000 mL (06/04/21 1640)   fentaNYL infusion INTRAVENOUS 175 mcg/hr (06/04/21 1338)   vancomycin Stopped (06/04/21 1103)    Assessment: 71 year old male with coronary artery disease who initially presented with chest pain, noted to have multivessel coronary artery disease. He underwent CABG x3 on 04/30/2021; course was complicated with atrial fibrillation, frequent hiccups and aspiration pneumonia leading to hypoxia and increasing oxygen requirement. Patient now requiring VV ECMO. Pharmacy consulted for bivalirudin IV.   pltc stable 130-150s, hgb stable 9s. LDH trended up 300>400> now back down 300 fibrinogen trended up 300>400> back down 300s  Noted to have some bleeding from cannula and trach site 2/12 improvement after thrombi pad and surgicel.  Oxygenator and trach changed out 2/14.   PM update: aPTT of 64 on bivalirudin 0.065 mg/kg/hr is therapeutic. Level drawn appropriately. Some slight blood noted during bronch today but no other signs of bleeding. Hgb 10.5. Plt 126.    Goal of Therapy:  aPTT 50-80 seconds  Monitor platelets by anticoagulation protocol: Yes   Plan:  Continue bivalirudin at 0.065 mg/kg/hr Monitor q12h aptt and CBC Monitor closely for s/sx bleeding/thrombus   Cristela Felt, PharmD, BCPS Clinical Pharmacist 06/04/2021 5:53 PM

## 2021-06-04 NOTE — Progress Notes (Signed)
Patient ID: Randall Hodges, male   DOB: Dec 13, 1950, 71 y.o.   MRN: [id number]    Progress Note  Patient Name: Randall Hodges Date of Encounter: 06/04/2021  [facility] Cardiologist: [name], MD   Subjective   2/2: VV ECMO initiation, Crescent catheter right IJ 2/7: Tracheostomy 2/13 CT C/A/P: Diffuse ground-glass opacities consistent with atelectasis and edema presumably related to ECMO physiology and respiratory failure.  Underlying moderate interstitial lung disease mildly progressed from prior CT. 2/15: Oxygenator changed, trach changed.  2/16: Transient asystole with cough and adjustment of tracheostomy overnight.   Weaning sedation, sat on side of bed yesterday.    Amio gtt stopped 2/11 by CCM. Remains in NSR.  As above, episodes of transient asystole with cough and adjusting trach.  Coreg stopped.   CXR bilateral diffuse disease. MRSE 1/2 blood cultures and trach aspirate, on vancomycin.     Vent FiO2 0.4, lung rest settings. CVP 9-10 this morning, had Lasix 40 mg IV bid yesterday with I/Os even and weight down.  Having some chugging in circuit this morning.  Creatinine stable 1.11.   Tube feeds on hold, noted in trach aspirate.   ECMO: Speed 2700 rpm Flow 3.5 L/min pVen -48 DeltaP 17 Sweep 6 PTT 61 (goal 50-80) LDH 257 ABG 7.42/43/65/93% Lactate 1.8  Cardiac Studies: Echo (limited, 1/30): Echo reviewed, there is clear respirophasic variation of the interventricular septum and marked respirophasic variation of E inflow velocity on doppler evaluation of the mitral valve.  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  LV EF 45-50%.   RHC Procedural Findings (on norepinephrine 6): Hemodynamics (mmHg) RA mean 12 RV 37/12 PA 38/16, mean 27 PCWP mean 11 LV 108/12 AO  96/55 PAPI 1.8 Oxygen saturations: PA 54% AO 94% Cardiac Output (Fick) 5.59  Cardiac Index (Fick) 2.81  PVR 2.8 WU Simultaneous RV/LV tracings were obtained.  Difficult to interpret due to atrial fibrillation.  There was some suggestion of discordance (ventricular interdependence) but not clear.  Inpatient Medications    Scheduled Meds:  sodium chloride   Intravenous Once   aspirin  81 mg Per Tube Daily   atropine       baclofen  5 mg Per Tube TID   chlorhexidine gluconate (MEDLINE KIT)  15 mL Mouth Rinse BID   Chlorhexidine Gluconate Cloth  6 each Topical Daily   clonazePAM  0.5 mg Per Tube QHS   colchicine  0.6 mg Per Tube Daily   feeding supplement (PROSource TF)  90 mL Per Tube BID   fiber  1 packet Per Tube TID   free water  300 mL Per Tube Q2H   furosemide  40 mg Intravenous BID   gabapentin  300 mg Per Tube QHS   Gerhardt's butt cream   Topical TID   guaiFENesin  30 mL Per Tube QID   insulin aspart  0-15 Units Subcutaneous Q4H   mouth rinse  15 mL Mouth Rinse 10 times per day   melatonin  5 mg Per Tube QHS   methylPREDNISolone (SOLU-MEDROL) injection  40 mg Intravenous Q24H   Followed by   Derrill Memo ON 06/05/2021] methylPREDNISolone (SOLU-MEDROL) injection  20 mg Intravenous Q24H   morphine  15 mg Per Tube Q6H   pantoprazole sodium  40 mg Per Tube BID   QUEtiapine  25 mg Per Tube QHS   rosuvastatin  10  mg Per Tube Daily   sodium chloride flush  10-40 mL Intracatheter Q12H   sodium chloride flush  10-40 mL Intracatheter Q12H   sodium chloride flush  3 mL Intravenous Q12H   Continuous Infusions:  sodium chloride Stopped (06/01/21 1121)   sodium chloride 10 mL/hr at 06/04/21 0700   sodium chloride 10 mL/hr at 06/04/21 0734   albumin human 12.5 g (06/04/21 0715)   albumin human Stopped (05/30/21 0800)   bivalirudin (ANGIOMAX) infusion 0.5 mg/mL (Non-ACS indications) 0.065 mg/kg/hr (06/04/21 0700)   dexmedetomidine (PRECEDEX) IV infusion 1.2 mcg/kg/hr (06/04/21 0700)    feeding supplement (VITAL 1.5 CAL) Stopped (06/04/21 0735)   fentaNYL infusion INTRAVENOUS 125 mcg/hr (06/04/21 0648)   vancomycin     PRN Meds: Place/Maintain arterial line **AND** sodium chloride, sodium chloride, sodium chloride, acetaminophen (TYLENOL) oral liquid 160 mg/5 mL, albumin human, albumin human, docusate, fentaNYL, fentaNYL (SUBLIMAZE) injection, fentaNYL (SUBLIMAZE) injection, levalbuterol, ondansetron (ZOFRAN) IV, polyvinyl alcohol, sennosides, sodium chloride flush   Vital Signs    Vitals:   06/04/21 0411 06/04/21 0500 06/04/21 0600 06/04/21 0700  BP:  103/60 109/66 (!) 176/78  Pulse:  (!) 56 (!) 58 80  Resp:  _0 Temp:      TempSrc:      SpO2:  92% 94% (!) 88%  Weight: 86.3 kg     Height:        Intake/Output Summary (Last 24 hours) at 06/04/2021 0750 Last data filed at 06/04/2021 0700 Gross per 24 hour  Intake 3660.32 ml  Output 3895 ml  Net -234.68 ml   Last 3 Weights 06/04/2021 06/03/2021 06/02/2021  Weight (lbs) 190 lb 4.1 oz 193 lb 9 oz 188 lb 11.4 oz  Weight (kg) 86.3 kg 87.8 kg 85.6 kg      Telemetry    NSR 60s-70s, transient brady => asystole with cough (personally reviewed)  Physical Exam   General: sedated on vent.  Neck: RIJ Crescent, no thyromegaly or thyroid nodule.  Lungs: Clear to auscultation bilaterally with normal respiratory effort. CV: Nondisplaced PMI.  Heart regular S1/S2, no S3/S4, no murmur.  1+ edema to thighs  Abdomen: Soft, nontender, no hepatosplenomegaly, no distention.  Skin: Intact without lesions or rashes.  Neurologic: Sedated Extremities: No clubbing or cyanosis.  HEENT: Normal.    Labs    High Sensitivity Troponin:   Recent Labs  Lab 04/19/2021 1321 04/22/2021 1518  TROPONINIHS 3 4     Chemistry Recent Labs  Lab 06/01/21 0350 06/01/21 0353 06/02/21 0345 06/02/21 0539 06/03/21 0342 06/03/21 0347 06/03/21 1611 06/04/21 0244 06/04/21 0311  NA 150*   < > 151*   < > 151*   < > 149* 145 145  K 4.6   <  > 4.3   < > 4.6   < > 4.2 4.2 4.2  CL 113*   < > 115*   < > 115*  --  115* 112*  --   CO2 30   < > 30   < > 29  --  29 27  --   GLUCOSE 168*   < > 148*   < > 176*  --  170* 166*  --   BUN 87*   < > 85*   < > 77*  --  75* 71*  --   CREATININE 1.29*   < > 1.25*   < > 1.17  --  1.10 1.11  --   CALCIUM 8.0*   < > 7.9*   < >  7.9*  --  7.7* 7.7*  --   MG 2.4  --  2.4  --   --   --   --   --   --   PROT 4.2*  --  4.2*  --  4.4*  --   --  4.0*  --   ALBUMIN 2.4*  --  2.6*  --  2.6*  --   --  2.3*  --   AST 233*  --  98*  --  79*  --   --  61*  --   ALT 179*  --  161*  --  142*  --   --  123*  --   ALKPHOS 177*  --  137*  --  132*  --   --  101  --   BILITOT 1.6*  --  1.0  --  1.0  --   --  0.8  --   GFRNONAA 59*   < > >60   < > >60  --  >60 >60  --   ANIONGAP 7   < > 6   < > 7  --  5 6  --    < > = values in this interval not displayed.    Lipids  No results for input(s): CHOL, TRIG, HDL, LABVLDL, LDLCALC, CHOLHDL in the last 168 hours.   Hematology Recent Labs  Lab 06/03/21 0342 06/03/21 0347 06/03/21 1611 06/04/21 0244 06/04/21 0311  WBC 12.3*  --  13.3* 10.7*  --   RBC 3.42*  --  3.15* 3.08*  --   HGB 10.2*   < > 9.2* 9.2* 9.2*  HCT 31.3*   < > 29.5* 28.3* 27.0*  MCV 91.5  --  93.7 91.9  --   MCH 29.8  --  29.2 29.9  --   MCHC 32.6  --  31.2 32.5  --   RDW 17.2*  --  17.2* 17.0*  --   PLT 150  --  146* 136*  --    < > = values in this interval not displayed.   Thyroid No results for input(s): TSH, FREET4 in the last 168 hours.  BNPNo results for input(s): BNP, PROBNP in the last 168 hours.  DDimer No results for input(s): DDIMER in the last 168 hours.   Radiology    DG CHEST PORT 1 VIEW  Result Date: 06/03/2021 CLINICAL DATA:  Tracheostomy present, on ECMO EXAM: PORTABLE CHEST 1 VIEW COMPARISON:  Radiograph 06/02/2021 FINDINGS: Unchanged tracheostomy tube and ECMO cannula. Feeding tube passes below the diaphragm, tip excluded by collimation. Intact sternotomy wires. CABG.  Unchanged cardiomediastinal silhouette. Diffuse airspace disease, slightly increased throughout the right lung. And small bilateral pleural effusions. No visible pneumothorax. There is a mild displaced right second rib fracture, unchanged. IMPRESSION: Diffuse airspace disease, slightly increased in the right lung. Unchanged bilateral pleural effusions. Unchanged ECMO cannula and tracheostomy tube. Electronically Signed   By: Maurine Simmering M.D.   On: 06/03/2021 08:23   ECHOCARDIOGRAM LIMITED  Result Date: 06/02/2021    ECHOCARDIOGRAM LIMITED REPORT   Patient Name:   Randall Hodges Date of Exam: 06/02/2021 Medical Rec #:  [id number]       Height:       70.0 in Accession #:    [id number]      Weight:       188.7 lb Date of Birth:  1950-08-20       BSA:  2.036 m Patient Age:    70 years        BP:           145/69 mmHg Patient Gender: M               HR:           77 bpm. Exam Location:  Inpatient Procedure: Limited Echo, Cardiac Doppler and Limited Color Doppler Indications:    ECMO  History:        Patient has prior history of Echocardiogram examinations, most                 recent 06/01/2021.  Sonographer:    Arlyss Gandy Referring Phys: [place]  1. Significant variation in septal movement, presumed to be respiratory although not monitored during the study. No evaluation of MV inflow. No significant pericardial effusion on this study, has improved since 05/11/2021. IVC is collpasing, which goes against significant pericardial constriction. RA ECMO catheter seen in the RA, well positioned. EF unchanged ~40-45%. Left ventricular ejection fraction, by estimation, is 40 to 45%. The left ventricle has mildly decreased function. The left ventricle demonstrates regional wall motion abnormalities (see scoring diagram/findings for description).  2. Right ventricular systolic function is moderately reduced. The right ventricular size is normal. There is mildly elevated pulmonary artery systolic  pressure. The estimated right ventricular systolic pressure is 55.7 mmHg.  3. The mitral valve is grossly normal. Trivial mitral valve regurgitation. No evidence of mitral stenosis.  4. Tricuspid valve regurgitation is mild to moderate.  5. The aortic valve is tricuspid.  6. The inferior vena cava is normal in size with greater than 50% respiratory variability, suggesting right atrial pressure of 3 mmHg. Comparison(s): No significant change from prior study. FINDINGS  Left Ventricle: Significant variation in septal movement, presumed to be respiratory although not monitored during the study. No evaluation of MV inflow. No significant pericardial effusion on this study, has improved since 05/09/2021. IVC is collpasing,  which goes against significant pericardial constriction. RA ECMO catheter seen in the RA, well positioned. EF unchanged ~40-45%. Left ventricular ejection fraction, by estimation, is 40 to 45%. The left ventricle has mildly decreased function. The left ventricle demonstrates regional wall motion abnormalities. The left ventricular internal cavity size was normal in size. There is no left ventricular hypertrophy.  LV Wall Scoring: The antero-lateral wall and posterior wall are akinetic. Right Ventricle: The right ventricular size is normal. No increase in right ventricular wall thickness. Right ventricular systolic function is moderately reduced. There is mildly elevated pulmonary artery systolic pressure. The tricuspid regurgitant velocity is 2.76 m/s, and with an assumed right atrial pressure of 8 mmHg, the estimated right ventricular systolic pressure is 32.2 mmHg. Pericardium: Trivial pericardial effusion is present. Mitral Valve: The mitral valve is grossly normal. Trivial mitral valve regurgitation. No evidence of mitral valve stenosis. Tricuspid Valve: The tricuspid valve is grossly normal. Tricuspid valve regurgitation is mild to moderate. No evidence of tricuspid stenosis. Aortic Valve: The  aortic valve is tricuspid. Venous: The inferior vena cava is normal in size with greater than 50% respiratory variability, suggesting right atrial pressure of 3 mmHg. LEFT VENTRICLE PLAX 2D LVIDd:         3.70 cm LVIDs:         2.90 cm LV PW:         1.10 cm LV IVS:        1.10 cm  TRICUSPID VALVE TR Peak  grad:   30.5 mmHg TR Vmax:        276.00 cm/s Eleonore Chiquito MD Electronically signed by Eleonore Chiquito MD Signature Date/Time: 06/02/2021/8:48:35 AM    Final     Cardiac Studies   Cath 05/11/2021 Distal left main [place] 111 bifurcation stenosis with 75% left main, 90% ostial to proximal LAD, and 80-90% ostial circumflex (difficult to assess due to heavy calcification). Severe mid circumflex disease with 70% eccentric mid stenosis and second obtuse marginal containing ostial to proximal greater than 80% stenosis.  (Bifurcation [place] 111 Severe calcification in left main and LAD in particular with diffuse 50% narrowing from proximal to mid vessel and tandem 70% stenoses in the mid LAD. Nondominant right coronary Normal LV function.  EF 55%.  LVEDP normal.    Patient Profile     71 y.o. male with PMH of PVCs presented with chest pain. Cardiac cath by Dr. Tamala Julian on 05/04/2021 showed 75% left main, 90% ost to prox LAD, 80-90% ost LCx, 70% mid LCx, 80% OM2, 50% prox to mid LAD, 70% mid LAD, EF 55%. Patient underwent CABG x 3 on 05/08/2021. Post op course complicated afib, treated with amio. CXR showed opacity in bilateral lung, started abx on 1/25 and diuretic. Started on Eliquis due to recurrence of afib.    Assessment & Plan    1. CAD: Admitted with unstable angina, cath with severe left main and proximal LAD/LCx disease (nondominant RCA).  CABG x 3 on 1/18 with LIMA-LAD, SVG-OM, SVG-left PDA.  No s/s angina  - Continue ASA 81, statin.  2. 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 RHC 1/30 with equalization of diastolic pressures.  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  Repeated echo 2/2 still showed respirophasic septal variation but not as impressive. CVP 9-10 today.  Lungs still with diffuse bilateral infiltrates on CXR. Weight down.   - Give Lasix 40 mg IV bid today with albumen if needed.  - With concern for development of post-surgical effusive/constrictive pericarditis, he is on colchicine.  - ?need for surgical intervention on effusive/constrictive pericarditis. Will need improvement of lung disease first then reassess, repeat echo not as impressive.   3. 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  Suspect primarily septic/vasodilatory shock. He is off pressors.  4. Hiccups: Still present.  - On baclofen.  - Gabapentin started 5. PNA with acute hypoxic respiratory failure: Of note, he does appear to have had some pre-existing ILD from 2019 CT chest (?sarcoidosis). CXR with persistent bilateral infiltrates, possible mild improvement compared to yesterday.  Have thought most likely aspiration PNA/pneumonitis in setting of intractable hiccups. He has developred ARDS.  Afebrile. COVID was negative.  FiO2 0.4 on vent.  We have struggled to wean sweep, now back to 5. CXR similar with bilateral diffuse airspace disease. Has tracheostomy. CT chest 2/9 with persistent lung infiltrates.  Completed vancomycin and meropenem => MRSE 1/2 blood and trach aspirate, now back on vancomycin. LDH stable today. Oxygenator changed out on 2/14.  On lung rest settings per CCM, FiO2 0.4 with stable ABG. Of note, TFs noted in trach aspirate and TFs off for now.      -  Vent and sedation per CCM. Weaning sedation today.  - Solumedrol 40 mg IV daily with gradual taper, ESR now normal.  - Off amiodarone.  Suspect lung toxicity unlikely but will keep off for now.  6. Atypical atrial flutter/PVCs:  Remains in NSR - Off amiodarone now.  - Bivalirudin gtt, goal PTT 50-80. Discussed dosing with PharmD personally. 7. AKI: Creatinine stable 1.1.  Follow closely.  8. Elevated LFTs: Suspect shock liver, follow CMET.  LFTs trending down.  9. Anemia: Transfuse hgb < 8.     10. Hypernatremia: Na 145 today.  - Continue free water 300 q2.  11. HTN: BP stable today.    12. Bradycardia/asystole: Vagally mediated with cough.  Resolves rapidly with resolution of cough.  - Atropine at bedside.  13. FEN: TFs on hold with aspiration (noted in trach aspirate).  - Start Reglan, restart TFs later this morning.  14. Mobilize as much as possible.  Wean sedation.   CRITICAL CARE Performed by: Loralie Champagne  Total critical care time: 45 minutes  Critical care time was exclusive of separately billable procedures and treating other patients.  Critical care was necessary to treat or prevent imminent or life-threatening deterioration.  Critical care was time spent personally by me on the following activities: development of treatment plan with patient and/or surrogate as well as nursing, discussions with consultants, evaluation of patient's response to treatment, examination of patient, obtaining history from patient or surrogate, ordering and performing treatments and interventions, ordering and review of laboratory studies, ordering and review of radiographic studies, pulse oximetry and re-evaluation of patient's condition.  Loralie Champagne MD 06/04/2021 7:50 AM

## 2021-06-04 NOTE — Progress Notes (Addendum)
Patient ID: Randall Hodges, male   DOB: [birth year], 71 y.o.   MRN: [id number]     Progress Note from the Palliative Medicine Team at [facility]   Patient Name: Randall Hodges        Date: 06/04/2021 DOB: [birth year]  Age: 71 y.o. MRN#: [id number] Attending Physician: Dahlia Byes, MD Primary Care Physician: [name], MD Admit Date: 05/02/2021   Medical records reviewed; labs, specialty notes  71 year old male with coronary artery disease who initially presented with chest pain, noted to have multivessel coronary artery disease, he underwent CABG x3 on 1/18/ 2023, course was complicated with atrial fibrillation, frequent hiccups and aspiration pneumonia leading to hypoxia and increasing oxygen requirement.  Significant Hospital events:  1/18 CABG 1/30 PCCM consult, Worsening O2 needs, likely aspiration, new pressor need 2/2 intubation, placed on VV ECMO 2/5 issues with recirc, bronch neg, improved with supine positioning 2/7 percutaneous tracheostomy 2/10 diuresis and weaning sedation. CT chest shows diffuse ground-glass opacification.  2/11 weaning sedation 2/13 increasing air hunger as oxygenator efficiency decreasing.  2/14 oxygenator changed.  Still having air hunger but able to follow commands and participate with therapy.  2/15 considerable distress with double stacking. Switched to true rest settings - with improvement in distress.     This NP rounded on  patient / wife at the bedside,  as a follow up for palliative medicine needs and emotional support.  I met with patient's wife and son for initial goals of care meeting on 05/26/2021.  Patient remains critically ill on ECMO.  Created space and opportunity for wife/Randall Hodges to explore thoughts and feelings regarding her husband's current medical situation. This is a difficult time for the family, long hospitalization feels like a  roller coaster.    Both of her children live out of town, thankfully she does  have a strong Geophysicist/field seismologist.   She remains hopeful/prayerful for signs of improvement  Emotional support offered  PMT will continue to support holistically   Education offered on the importance of continued conversation with her  family and their  medical providers regarding overall plan of care and treatment options,  ensuring decisions are within the context of the patients values and GOCs.  Questions and concerns addressed   Discussed with bedside RN    Wadie Lessen NP  Palliative Medicine Team Team Phone # [phone] Pager [phone]

## 2021-06-04 NOTE — Progress Notes (Signed)
NAME:  Randall Hodges, MRN:  [id number], DOB:  Apr 23, 1950, LOS: 32 ADMISSION DATE:  04/30/2021, CONSULTATION DATE: 04/27/2021 REFERRING MD:  Dahlia Byes, MD, CHIEF COMPLAINT: Increasing shortness of breath  History of Present Illness:  71 year old male with coronary artery disease who initially presented with chest pain, noted to have multivessel coronary artery disease, he underwent CABG x3 on 04/1818 23, course was complicated with atrial fibrillation, frequent hiccups and aspiration pneumonia leading to hypoxia and increasing oxygen requirement. PCCM was consulted for evaluation and help with management  Patient stated hiccups are better but continued complain of shortness of breath, cough unable to bring up phlegm.  Pertinent  Medical History   Past Medical History:  Diagnosis Date   Allergic rhinitis    Prostate cancer ([place])     Significant Hospital Events: Including procedures, antibiotic start and stop dates in addition to other pertinent events   1/18 CABG 1/30 PCCM consult, Worsening O2 needs, likely aspiration, new pressor need 2/2 intubation, placed on VV ECMO 2/5 issues with recirc, bronch neg, improved with supine positioning 2/7 percutaneous tracheostomy 2/10 diuresis and weaning sedation. CT chest shows diffuse ground-glass opacification.  2/11 weaning sedation 2/13 increasing air hunger as oxygenator efficiency decreasing.  2/14 oxygenator changed.  Still having air hunger but able to follow commands and participate with therapy.  2/15 considerable distress with double stacking. Switched to true rest settings - with improvement in distress.   Interim History / Subjective:   Episode of bradycardia this morning.   Objective   Blood pressure (!) 176/78, pulse 80, temperature 98 F (36.7 C), temperature source Axillary, resp. rate 20, height 5\' 10"  (1.778 m), weight 86.3 kg, SpO2 (!) 88 %. CVP:  [8 mmHg-16 mmHg] 16 mmHg  Vent Mode: PCV FiO2 (%):  [40 %] 40  % Set Rate:  [10 bmp-22 bmp] 10 bmp PEEP:  [10 HDQ22-29 cmH20] 10 cmH20 Plateau Pressure:  [15 cmH20-21 cmH20] 18 cmH20   Intake/Output Summary (Last 24 hours) at 06/04/2021 0836 Last data filed at 06/04/2021 0800 Gross per 24 hour  Intake 3647.5 ml  Output 3830 ml  Net -182.5 ml    Filed Weights   06/02/21 0348 06/03/21 0500 06/04/21 0411  Weight: 85.6 kg 87.8 kg 86.3 kg   Examination: General: average build appears acutely ill. On dexmedetomidine and fentanyl  HENT: tracheostomy and Cortrak in place without pressure injury. Cannula site intact. Minimal crusted blood.  Lungs: scattered crackles. On PCV with Pplat 20 driving pressure 10. 0.4/10.  Tachypnea.  VV  ECMO at 3000 rpm with sweep 6 Cardiovascular: HS normal  Abdomen: soft, still Flexiseal in place Extremities: edema +1 - much improved.  Neuro: moves all limbs and will open eyes weakly to command.  GU: Foley in place with abundant clear urine  Ancillary tests personally reviewed:   7.35/48.6/51/84 Na: 147 CO2 27 LDH 257 WBC 10.7 HB: 10.5 INR 2.1 Staph epi in 1 blood and sputum Cortrak is post pyloric Assessment & Plan:   CAD with unstable angina s/p CABGx3 1/18 (LIMA-LAD, SVG-OM, SVG-left PDA) Presumed aspiration pneumonitis vs. HCAP vs.  culminating in severe ARDS with inability to ventilate along with high Aa gradient s/p VV ECMO cannulation 05/21/22.  CT shows predominant alveolitis - should resolve completely with time.  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  Hypoactive delirium   Plan:   - Optimize sedation -  restart seroquel, continue morphine for pain and air hunger. Continue clonazepam.  Wean off sedative infusions.  - Continue BID baclofen and gabapentin for hiccups. Seems to have stopped.  - Taper steroids.  - Stop antihypertensives.  - Continue free water - Continue to  diurese.  - PT/OT w/ reduced sedation to promote activity  - Start vancomycin for 7 days as patient oxygenation seems a little worse - Stop laxative to stop diarrhea which is contributing to hypernatremia. - Reglan to help control emesis - suspect poor LES tone.   Best Practice (right click and "Reselect all SmartList Selections" daily)   Diet/type: Vital @ 74mL/hr DVT prophylaxis: [place] CBG: yes, SSI GI prophylaxis: PPI Lines: [address] TLC, RIJ  Foley:  Yes, keep Code Status:  full code Last date of multidisciplinary goals of care discussion [daily at bedside]  CRITICAL CARE Performed by: Kipp Brood   Total critical care time: 40 minutes  Critical care time was exclusive of separately billable procedures and treating other patients.  Critical care was necessary to treat or prevent imminent or life-threatening deterioration.  Critical care was time spent personally by me on the following activities: development of treatment plan with patient and/or surrogate as well as nursing, discussions with consultants, evaluation of patient's response to treatment, examination of patient, obtaining history from patient or surrogate, ordering and performing treatments and interventions, ordering and review of laboratory studies, ordering and review of radiographic studies, pulse oximetry, re-evaluation of patient's condition and participation in multidisciplinary rounds.  Kipp Brood, MD [facility] ICU Physician Agenda  Pager: [phone] Mobile: [phone] After hours: [phone].

## 2021-06-05 ENCOUNTER — Encounter (HOSPITAL_COMMUNITY): Payer: Self-pay | Admitting: Cardiothoracic Surgery

## 2021-06-05 ENCOUNTER — Inpatient Hospital Stay (HOSPITAL_COMMUNITY): Payer: PPO

## 2021-06-05 DIAGNOSIS — I4892 Unspecified atrial flutter: Secondary | ICD-10-CM

## 2021-06-05 DIAGNOSIS — J9601 Acute respiratory failure with hypoxia: Secondary | ICD-10-CM | POA: Diagnosis not present

## 2021-06-05 DIAGNOSIS — I4729 Other ventricular tachycardia: Secondary | ICD-10-CM

## 2021-06-05 DIAGNOSIS — I249 Acute ischemic heart disease, unspecified: Secondary | ICD-10-CM | POA: Diagnosis not present

## 2021-06-05 DIAGNOSIS — Z9911 Dependence on respirator [ventilator] status: Secondary | ICD-10-CM | POA: Diagnosis not present

## 2021-06-05 DIAGNOSIS — Z9281 Personal history of extracorporeal membrane oxygenation (ECMO): Secondary | ICD-10-CM | POA: Diagnosis not present

## 2021-06-05 LAB — POCT I-STAT 7, (LYTES, BLD GAS, ICA,H+H)
Acid-Base Excess: 0 mmol/L (ref 0.0–2.0)
Acid-Base Excess: 1 mmol/L (ref 0.0–2.0)
Acid-base deficit: 4 mmol/L — ABNORMAL HIGH (ref 0.0–2.0)
Acid-base deficit: 2 mmol/L (ref 0.0–2.0)
Acid-base deficit: 2 mmol/L (ref 0.0–2.0)
Acid-base deficit: 2 mmol/L (ref 0.0–2.0)
Bicarbonate: 25.8 mmol/L (ref 20.0–28.0)
Bicarbonate: 24.1 mmol/L (ref 20.0–28.0)
Bicarbonate: 23.7 mmol/L (ref 20.0–28.0)
Bicarbonate: 23.7 mmol/L (ref 20.0–28.0)
Bicarbonate: 25.3 mmol/L (ref 20.0–28.0)
Bicarbonate: 21.9 mmol/L (ref 20.0–28.0)
Calcium, Ion: 1.26 mmol/L (ref 1.15–1.40)
Calcium, Ion: 1.23 mmol/L (ref 1.15–1.40)
Calcium, Ion: 1.19 mmol/L (ref 1.15–1.40)
Calcium, Ion: 1.2 mmol/L (ref 1.15–1.40)
Calcium, Ion: 1.18 mmol/L (ref 1.15–1.40)
Calcium, Ion: 1.07 mmol/L — ABNORMAL LOW (ref 1.15–1.40)
HCT: 29 % — ABNORMAL LOW (ref 39.0–52.0)
HCT: 36 % — ABNORMAL LOW (ref 39.0–52.0)
HCT: 32 % — ABNORMAL LOW (ref 39.0–52.0)
HCT: 33 % — ABNORMAL LOW (ref 39.0–52.0)
HCT: 35 % — ABNORMAL LOW (ref 39.0–52.0)
HCT: 33 % — ABNORMAL LOW (ref 39.0–52.0)
Hemoglobin: 9.9 g/dL — ABNORMAL LOW (ref 13.0–17.0)
Hemoglobin: 12.2 g/dL — ABNORMAL LOW (ref 13.0–17.0)
Hemoglobin: 10.9 g/dL — ABNORMAL LOW (ref 13.0–17.0)
Hemoglobin: 11.2 g/dL — ABNORMAL LOW (ref 13.0–17.0)
Hemoglobin: 11.9 g/dL — ABNORMAL LOW (ref 13.0–17.0)
Hemoglobin: 11.2 g/dL — ABNORMAL LOW (ref 13.0–17.0)
O2 Saturation: 91 %
O2 Saturation: 86 %
O2 Saturation: 85 %
O2 Saturation: 87 %
O2 Saturation: 88 %
O2 Saturation: 86 %
Patient temperature: 36.7
Patient temperature: 36.6
Patient temperature: 36.7
Patient temperature: 98.4
Patient temperature: 36.6
Patient temperature: 36.8
Potassium: 4.3 mmol/L (ref 3.5–5.1)
Potassium: 4.3 mmol/L (ref 3.5–5.1)
Potassium: 4 mmol/L (ref 3.5–5.1)
Potassium: 4.2 mmol/L (ref 3.5–5.1)
Potassium: 3.6 mmol/L (ref 3.5–5.1)
Potassium: 3.2 mmol/L — ABNORMAL LOW (ref 3.5–5.1)
Sodium: 143 mmol/L (ref 135–145)
Sodium: 143 mmol/L (ref 135–145)
Sodium: 143 mmol/L (ref 135–145)
Sodium: 143 mmol/L (ref 135–145)
Sodium: 144 mmol/L (ref 135–145)
Sodium: 145 mmol/L (ref 135–145)
TCO2: 27 mmol/L (ref 22–32)
TCO2: 26 mmol/L (ref 22–32)
TCO2: 25 mmol/L (ref 22–32)
TCO2: 25 mmol/L (ref 22–32)
TCO2: 27 mmol/L (ref 22–32)
TCO2: 23 mmol/L (ref 22–32)
pCO2 arterial: 48.7 mmHg — ABNORMAL HIGH (ref 32–48)
pCO2 arterial: 54.1 mmHg — ABNORMAL HIGH (ref 32–48)
pCO2 arterial: 42 mmHg (ref 32–48)
pCO2 arterial: 44.9 mmHg (ref 32–48)
pCO2 arterial: 38.8 mmHg (ref 32–48)
pCO2 arterial: 34.8 mmHg (ref 32–48)
pH, Arterial: 7.331 — ABNORMAL LOW (ref 7.35–7.45)
pH, Arterial: 7.254 — ABNORMAL LOW (ref 7.35–7.45)
pH, Arterial: 7.329 — ABNORMAL LOW (ref 7.35–7.45)
pH, Arterial: 7.358 (ref 7.35–7.45)
pH, Arterial: 7.421 (ref 7.35–7.45)
pH, Arterial: 7.406 (ref 7.35–7.45)
pO2, Arterial: 66 mmHg — ABNORMAL LOW (ref 83–108)
pO2, Arterial: 59 mmHg — ABNORMAL LOW (ref 83–108)
pO2, Arterial: 52 mmHg — ABNORMAL LOW (ref 83–108)
pO2, Arterial: 54 mmHg — ABNORMAL LOW (ref 83–108)
pO2, Arterial: 52 mmHg — ABNORMAL LOW (ref 83–108)
pO2, Arterial: 50 mmHg — ABNORMAL LOW (ref 83–108)

## 2021-06-05 LAB — HEPATIC FUNCTION PANEL
ALT: 101 U/L — ABNORMAL HIGH (ref 0–44)
AST: 40 U/L (ref 15–41)
Albumin: 2.4 g/dL — ABNORMAL LOW (ref 3.5–5.0)
Alkaline Phosphatase: 95 U/L (ref 38–126)
Bilirubin, Direct: 0.2 mg/dL (ref 0.0–0.2)
Indirect Bilirubin: 0.6 mg/dL (ref 0.3–0.9)
Total Bilirubin: 0.8 mg/dL (ref 0.3–1.2)
Total Protein: 4.3 g/dL — ABNORMAL LOW (ref 6.5–8.1)

## 2021-06-05 LAB — CALCIUM, IONIZED: Calcium, Ionized, Serum: 4.9 mg/dL (ref 4.5–5.6)

## 2021-06-05 LAB — CBC
HCT: 32.1 % — ABNORMAL LOW (ref 39.0–52.0)
HCT: 36.9 % — ABNORMAL LOW (ref 39.0–52.0)
Hemoglobin: 10 g/dL — ABNORMAL LOW (ref 13.0–17.0)
Hemoglobin: 11.6 g/dL — ABNORMAL LOW (ref 13.0–17.0)
MCH: 29.5 pg (ref 26.0–34.0)
MCH: 29.5 pg (ref 26.0–34.0)
MCHC: 31.2 g/dL (ref 30.0–36.0)
MCHC: 31.4 g/dL (ref 30.0–36.0)
MCV: 94.7 fL (ref 80.0–100.0)
MCV: 93.9 fL (ref 80.0–100.0)
Platelets: 121 10*3/uL — ABNORMAL LOW (ref 150–400)
Platelets: 175 10*3/uL (ref 150–400)
RBC: 3.39 MIL/uL — ABNORMAL LOW (ref 4.22–5.81)
RBC: 3.93 MIL/uL — ABNORMAL LOW (ref 4.22–5.81)
RDW: 17.3 % — ABNORMAL HIGH (ref 11.5–15.5)
RDW: 17.5 % — ABNORMAL HIGH (ref 11.5–15.5)
WBC: 10.3 10*3/uL (ref 4.0–10.5)
WBC: 14.6 10*3/uL — ABNORMAL HIGH (ref 4.0–10.5)
nRBC: 0 % (ref 0.0–0.2)
nRBC: 0 % (ref 0.0–0.2)

## 2021-06-05 LAB — BASIC METABOLIC PANEL
Anion gap: 5 (ref 5–15)
Anion gap: 10 (ref 5–15)
Anion gap: 9 (ref 5–15)
BUN: 62 mg/dL — ABNORMAL HIGH (ref 8–23)
BUN: 62 mg/dL — ABNORMAL HIGH (ref 8–23)
BUN: 58 mg/dL — ABNORMAL HIGH (ref 8–23)
CO2: 26 mmol/L (ref 22–32)
CO2: 24 mmol/L (ref 22–32)
CO2: 25 mmol/L (ref 22–32)
Calcium: 7.8 mg/dL — ABNORMAL LOW (ref 8.9–10.3)
Calcium: 7.9 mg/dL — ABNORMAL LOW (ref 8.9–10.3)
Calcium: 7.8 mg/dL — ABNORMAL LOW (ref 8.9–10.3)
Chloride: 112 mmol/L — ABNORMAL HIGH (ref 98–111)
Chloride: 109 mmol/L (ref 98–111)
Chloride: 110 mmol/L (ref 98–111)
Creatinine, Ser: 1.01 mg/dL (ref 0.61–1.24)
Creatinine, Ser: 1.04 mg/dL (ref 0.61–1.24)
Creatinine, Ser: 1.04 mg/dL (ref 0.61–1.24)
GFR, Estimated: >60 mL/min (ref >60)
GFR, Estimated: >60 mL/min (ref >60)
GFR, Estimated: >60 mL/min (ref >60)
Glucose, Bld: 162 mg/dL — ABNORMAL HIGH (ref 70–99)
Glucose, Bld: 160 mg/dL — ABNORMAL HIGH (ref 70–99)
Glucose, Bld: 175 mg/dL — ABNORMAL HIGH (ref 70–99)
Potassium: 4.4 mmol/L (ref 3.5–5.1)
Potassium: 3.5 mmol/L (ref 3.5–5.1)
Potassium: 3.4 mmol/L — ABNORMAL LOW (ref 3.5–5.1)
Sodium: 143 mmol/L (ref 135–145)
Sodium: 143 mmol/L (ref 135–145)
Sodium: 144 mmol/L (ref 135–145)

## 2021-06-05 LAB — LACTIC ACID, PLASMA: Lactic Acid, Venous: 1.1 mmol/L (ref 0.5–1.9)

## 2021-06-05 LAB — GLUCOSE, CAPILLARY
Glucose-Capillary: 124 mg/dL — ABNORMAL HIGH (ref 70–99)
Glucose-Capillary: 131 mg/dL — ABNORMAL HIGH (ref 70–99)
Glucose-Capillary: 140 mg/dL — ABNORMAL HIGH (ref 70–99)
Glucose-Capillary: 143 mg/dL — ABNORMAL HIGH (ref 70–99)
Glucose-Capillary: 157 mg/dL — ABNORMAL HIGH (ref 70–99)
Glucose-Capillary: 158 mg/dL — ABNORMAL HIGH (ref 70–99)

## 2021-06-05 LAB — FIBRINOGEN: Fibrinogen: 387 mg/dL (ref 210–475)

## 2021-06-05 LAB — CULTURE, BLOOD (ROUTINE X 2): Special Requests: ADEQUATE

## 2021-06-05 LAB — APTT
aPTT: 64 seconds — ABNORMAL HIGH (ref 24–36)
aPTT: 57 seconds — ABNORMAL HIGH (ref 24–36)

## 2021-06-05 LAB — LACTATE DEHYDROGENASE: LDH: 222 U/L — ABNORMAL HIGH (ref 98–192)

## 2021-06-05 LAB — PROTIME-INR
INR: 2.2 — ABNORMAL HIGH (ref 0.8–1.2)
Prothrombin Time: 24.1 seconds — ABNORMAL HIGH (ref 11.4–15.2)

## 2021-06-05 LAB — TROPONIN I (HIGH SENSITIVITY): Troponin I (High Sensitivity): 211 ng/L (ref <18)

## 2021-06-05 LAB — MAGNESIUM: Magnesium: 2 mg/dL (ref 1.7–2.4)

## 2021-06-05 MED ORDER — ALBUMIN HUMAN 25 % IV SOLN
25.0000 g | Freq: Two times a day (BID) | INTRAVENOUS | Status: AC
  Administered 2021-06-05: 25 g via INTRAVENOUS
  Filled 2021-06-05: qty 100

## 2021-06-05 MED ORDER — LIP MEDEX EX OINT
TOPICAL_OINTMENT | CUTANEOUS | Status: DC | PRN
  Administered 2021-06-09 (×2): 75 via TOPICAL
  Filled 2021-06-05 (×2): qty 7

## 2021-06-05 MED ORDER — MIDAZOLAM HCL 2 MG/2ML IJ SOLN: 2.0000 mg | Freq: Once | INTRAMUSCULAR | Status: AC

## 2021-06-05 MED ORDER — AMIODARONE HCL IN DEXTROSE 360-4.14 MG/200ML-% IV SOLN
30.0000 mg/h | INTRAVENOUS | Status: DC
Start: 2021-06-05 — End: 2021-06-07
  Administered 2021-06-06 – 2021-06-07 (×3): 30 mg/h via INTRAVENOUS
  Filled 2021-06-05 (×3): qty 200

## 2021-06-05 MED ORDER — POTASSIUM CHLORIDE 20 MEQ PO PACK
40.0000 meq | PACK | Freq: Once | ORAL | Status: AC
  Administered 2021-06-05: 40 meq
  Filled 2021-06-05: qty 2

## 2021-06-05 MED ORDER — AMIODARONE IV BOLUS ONLY 150 MG/100ML
INTRAVENOUS | Status: AC
  Administered 2021-06-05: 150 mg via INTRAVENOUS
  Filled 2021-06-05: qty 100

## 2021-06-05 MED ORDER — AMIODARONE HCL IN DEXTROSE 360-4.14 MG/200ML-% IV SOLN
INTRAVENOUS | Status: AC
  Administered 2021-06-05: 30 mg/h via INTRAVENOUS
  Filled 2021-06-05: qty 200

## 2021-06-05 MED ORDER — WHITE PETROLATUM EX OINT
TOPICAL_OINTMENT | CUTANEOUS | Status: DC | PRN
  Filled 2021-06-05 (×2): qty 28.35

## 2021-06-05 MED ORDER — FUROSEMIDE 10 MG/ML IJ SOLN
6.0000 mg/h | INTRAVENOUS | Status: DC
  Administered 2021-06-05 – 2021-06-09 (×5): 8 mg/h via INTRAVENOUS
  Administered 2021-06-10: 6 mg/h via INTRAVENOUS
  Filled 2021-06-05 (×7): qty 20

## 2021-06-05 MED ORDER — FUROSEMIDE 10 MG/ML IJ SOLN: 60.0000 mg | Freq: Two times a day (BID) | INTRAMUSCULAR | Status: DC

## 2021-06-05 MED ORDER — MIDAZOLAM HCL 2 MG/2ML IJ SOLN
INTRAMUSCULAR | Status: AC
  Administered 2021-06-05: 2 mg via INTRAVENOUS
  Filled 2021-06-05: qty 2

## 2021-06-05 MED ORDER — AMIODARONE IV BOLUS ONLY 150 MG/100ML: 150.0000 mg | Freq: Once | INTRAVENOUS | Status: AC

## 2021-06-05 MED ORDER — HYDRALAZINE HCL 20 MG/ML IJ SOLN
20.0000 mg | Freq: Four times a day (QID) | INTRAMUSCULAR | Status: DC | PRN
  Administered 2021-06-05: 20 mg via INTRAVENOUS
  Filled 2021-06-05: qty 1

## 2021-06-05 MED ORDER — FREE WATER
200.0000 mL | Status: DC
  Administered 2021-06-05 – 2021-06-07 (×24): 200 mL

## 2021-06-05 MED ORDER — PHYTONADIONE 1 MG/0.5 ML ORAL SOLUTION
1.0000 mg | Freq: Once | ORAL | Status: AC
  Administered 2021-06-05: 1 mg via ORAL
  Filled 2021-06-05: qty 0.5

## 2021-06-05 NOTE — Progress Notes (Signed)
SLP Cancellation Note  Patient Details Name: Randall Hodges MRN: [id number] DOB: 02-02-1951   Cancelled treatment:    Spoke with RN, pt is not ready for inline PMV at this time.  Our service will sign off.  If/when warranted, please re-consult SLP.  Thank you. Nysia Dell L. Tivis Ringer, [place] CCC/SLP Acute Rehabilitation Services Office number [phone] Pager [phone]        Assunta Curtis 06/05/2021, 9:49 AM

## 2021-06-05 NOTE — Progress Notes (Signed)
Physical Therapy Treatment Patient Details Name: CASHAWN YANKO MRN: [id number] DOB: 1951/04/19 Today's Date: 06/05/2021   History of Present Illness 71 yo male with CAD who initially presented with CP, noted to have multivessel CAD, he underwent CABG x3 on 05/15/2021, course was complicated with atrial fibrillation, frequent hiccups and aspiration PNA leading to hypoxia and increasing oxygen requirement. ETT 1/18-1/18. 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 Cortrak 1/30. 2/2 placed on VV ECMO. 2/7 trach. 2/14 ECMO circuit replacement. PMH: prostate CA    PT Comments    Upon entry into room, pt spontaneously actively lifting L UE up against gravity supine in bed. Pt following ~10% of simple multi-modal cues with significant delay and even nodding head slightly occasionally to answer questions. Pt with a few noted bouts of muscle activation in his lower extremities bil today. Due to difficulty in following commands and overall significant weakness, he still requires TA x3 for bed mobility and line management. Pt sitting EOB for > 10 min with mod-TA, displaying attempts to hold his balance on occasion. Will continue to follow acutely. Current recommendations remain appropriate.    Recommendations for follow up therapy are one component of a multi-disciplinary discharge planning process, led by the attending physician.  Recommendations may be updated based on patient status, additional functional criteria and insurance authorization.  Follow Up Recommendations  Acute inpatient rehab (3hours/day)     Assistance Recommended at Discharge Frequent or constant Supervision/Assistance  Patient can return home with the following A lot of help with walking and/or transfers;A lot of help with bathing/dressing/bathroom;Two people to help with walking and/or transfers;Two people to help with bathing/dressing/bathroom;Assistance with feeding;Assistance with cooking/housework;Direct  supervision/assist for medications management;Direct supervision/assist for financial management;Assist for transportation;Help with stairs or ramp for entrance   Equipment Recommendations  BSC/3in1;Wheelchair (measurements PT);Wheelchair cushion (measurements PT);Hospital bed;Other (comment) (hoyer lift)    Recommendations for Other Services       Precautions / Restrictions Precautions Precautions: Fall;Sternal Precaution Booklet Issued: No Precaution Comments: ECMO, cortrak, foley, aline, sternal precautions, flexiseal, NG tube, trach on vent Restrictions Weight Bearing Restrictions: Yes Other Position/Activity Restrictions: sternal precautions     Mobility  Bed Mobility Overal bed mobility: Needs Assistance       Supine to sit: Total assist, +2 for physical assistance, +2 for safety/equipment, HOB elevated Sit to supine: Total assist, +2 for physical assistance, +2 for safety/equipment, HOB elevated   General bed mobility comments: TAx3 for line management and to manage legs and trunk to transition supine <> sit L EOB with HOB elevated. Pt displaying intermittent muscle activation to cues to bring legs laterally to EOB.    Transfers                   General transfer comment: deferred    Ambulation/Gait               General Gait Details: unable   Stairs             Wheelchair Mobility    Modified Rankin (Stroke Patients Only)       Balance Overall balance assessment: Needs assistance Sitting-balance support: Bilateral upper extremity supported, No upper extremity supported, Feet supported Sitting balance-Leahy Scale: Zero Sitting balance - Comments: Pt requiring TA to sit EOB initially with posterior lean. Progressed to mod-maxA to sit EOB remaining amount of time, placing pt's hands on his knees or in RN's hands to hold and providing verbal and tactile cues to flex anteriorly for improved upright  balance, momentary success at muscle  initiation to lean anteriorly. Provided verbal and tactile cues for anterior pelvic tilt and scapular adduction, momenatry muscle activation intermittently. Postural control: Posterior lean     Standing balance comment: unable                            Cognition Arousal/Alertness: Awake/alert Behavior During Therapy: Flat affect Overall Cognitive Status: Difficult to assess                                 General Comments: Pt with eyes open for majority of session. Followed ~10% of simple multi-modal commands with delayed response, sometimes up to ~20 sec delay.        Exercises General Exercises - Upper Extremity Shoulder Flexion: Both, Other (comment), AAROM, Other reps (comment), Seated (x3, min activation noted by pt first rep with little to none on final 2 reps) General Exercises - Lower Extremity Quad Sets: AROM, Strengthening, Both, Other reps (comment), Supine (x1 rep with success) Hip ABduction/ADduction: AAROM, Both, Other reps (comment), Supine (x3-5 reps, slight activation by pt intermittently) Other Exercises Other Exercises: PROM trunk rotation 1x each direction seated EOB Other Exercises: scpaular adduction sitting EOB, momentary muscle activation noted intermittently    General Comments General comments (skin integrity, edema, etc.): SpO2 down to 70s% but improved with neck extension but RN and ECMO specialist reporting pt stable with low sats today.      Pertinent Vitals/Pain Pain Assessment Pain Assessment: Faces Faces Pain Scale: Hurts little more Pain Location: generalized grimacing with coughing Pain Descriptors / Indicators: Grimacing Pain Intervention(s): Limited activity within patient's tolerance, Monitored during session, Repositioned    Home Living                          Prior Function            PT Goals (current goals can now be found in the care plan section) Acute Rehab PT Goals Patient Stated Goal:  did not state PT Goal Formulation: Patient unable to participate in goal setting Time For Goal Achievement: 06/19/21 Potential to Achieve Goals: Fair Additional Goals Additional Goal #1: Pt will follow >/= 25% of cues for lower extremity muscle activation. Progress towards PT goals: Progressing toward goals    Frequency    Min 3X/week      PT Plan Current plan remains appropriate    Co-evaluation              AM-PAC PT "6 Clicks" Mobility   Outcome Measure  Help needed turning from your back to your side while in a flat bed without using bedrails?: Total Help needed moving from lying on your back to sitting on the side of a flat bed without using bedrails?: Total Help needed moving to and from a bed to a chair (including a wheelchair)?: Total Help needed standing up from a chair using your arms (e.g., wheelchair or bedside chair)?: Total Help needed to walk in hospital room?: Total Help needed climbing 3-5 steps with a railing? : Total 6 Click Score: 6    End of Session Equipment Utilized During Treatment: Oxygen Activity Tolerance: Patient tolerated treatment well Patient left: in bed;with call bell/phone within reach;with nursing/sitter in room Nurse Communication: Mobility status PT Visit Diagnosis: Muscle weakness (generalized) (M62.81);Other abnormalities of gait and mobility (R26.89);Difficulty in walking,  not elsewhere classified (R26.2);Unsteadiness on feet (R26.81)     Time: 8315-1761 PT Time Calculation (min) (ACUTE ONLY): 25 min  Charges:  $Therapeutic Activity: 8-22 mins $Neuromuscular Re-education: 8-22 mins                     Moishe Spice, PT, DPT Acute Rehabilitation Services  Pager: [phone] Office: [phone]    Orvan Falconer 06/05/2021, 3:52 PM

## 2021-06-05 NOTE — Progress Notes (Addendum)
Occupational Therapy Treatment Patient Details Name: Randall Hodges MRN: [id number] DOB: June 12, 1950 Today's Date: 06/05/2021   History of present illness 71 yo male with CAD who initially presented with CP, noted to have multivessel CAD, he underwent CABG x3 on 05/19/2021, course was complicated with atrial fibrillation, frequent hiccups and aspiration PNA leading to hypoxia and increasing oxygen requirement. ETT 1/18-1/18. 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 Cortrak 1/30. 2/2 placed on VV ECMO. 2/7 trach. 2/14 ECMO circuit replacement. PMH: prostate CA   OT comments  Pt with increased activity tolerance this session and able to tolerate standing position with use of kreg bed. Pt tolerating 35* tilt, 45* tilt, and then 50* (total of 17 minutes). Facilitating PROM of BUE while in semi standing. Following cues for squeeze R hand once. SpO2 89-86 on vent via trach. HR 80-90s.  RR 20-30s. BP 148/63 during activity. RN present throughout. Playing music [employer]). Continue to recommend dc to post-acute rehab and will continue to follow acutely as admitted. Updated OT goals.   Recommendations for follow up therapy are one component of a multi-disciplinary discharge planning process, led by the attending physician.  Recommendations may be updated based on patient status, additional functional criteria and insurance authorization.    Follow Up Recommendations  Acute inpatient rehab (3hours/day)    Assistance Recommended at Discharge Frequent or constant Supervision/Assistance  Patient can return home with the following  A lot of help with walking and/or transfers   Equipment Recommendations  Other (comment) (defer)    Recommendations for Other Services      Precautions / Restrictions Precautions Precautions: Fall;Sternal Precaution Booklet Issued: No Precaution Comments: ECMO, cortrak, foley, aline, sternal precautions, flexiseal, NG tube, trach on vent Restrictions Weight Bearing  Restrictions: Yes (consult ecmo therapist) Other Position/Activity Restrictions: sternal precautions       Mobility Bed Mobility               General bed mobility comments: Total A for repositioning in bed    Transfers                   General transfer comment: Focused on tilt bed for standing position     Balance             Standing balance-Leahy Scale: Zero Standing balance comment: Tolerating kreg bed and tilting to 35* for 8 minutes. Transitioning to 45% for 6 minutes. Tolerating 50* for 3 minutes before noting sliding of bil feet and decreased trunk control.                           ADL either performed or assessed with clinical judgement   ADL Overall ADL's : Needs assistance/impaired                                       General ADL Comments: Tilt bed for 17 minutes. 35-50*. Facilitating ROM exercises    Extremity/Trunk Assessment Upper Extremity Assessment Upper Extremity Assessment: RUE deficits/detail;LUE deficits/detail RUE Deficits / Details: Significant weakness and edema. Squeezing therapists hand once. WFL PROM. RUE Coordination: decreased fine motor;decreased gross motor LUE Deficits / Details: Significant weakness and edema. WFL PROM. LUE Coordination: decreased fine motor;decreased gross motor   Lower Extremity Assessment Lower Extremity Assessment: Defer to PT evaluation        Vision       Perception  Praxis      Cognition Arousal/Alertness: Awake/alert Behavior During Therapy: Flat affect Overall Cognitive Status: Difficult to assess                                 General Comments: Pt with eyes open for majority of session. Squeeze R hand.        Exercises Exercises: Other exercises General Exercises - Upper Extremity Shoulder Flexion: PROM, Both, 10 reps, Other (comment) (standing in kreg bed) Shoulder ABduction: PROM, Right, Left, 10 reps (standing in kreg  bed) Shoulder ADduction: PROM, Right, Left, 10 reps (standing in kreg bed) Elbow Flexion: PROM, Both, 10 reps, Other (comment) (standing in kreg bed) Elbow Extension: PROM, Both, 10 reps, Other (comment) (standing in kreg bed) Wrist Flexion: PROM, Both, 10 reps (standing in kreg bed) Wrist Extension: PROM, Both, 10 reps (standing in kreg bed) Digit Composite Flexion: PROM, 10 reps, Both, Seated Composite Extension: PROM, Both, 10 reps, Seated Other Exercises Other Exercises: F1 pattern; PROM; standing in kreg bed; x10; BUEs    Shoulder Instructions       General Comments SpO2 89-86 on vent via trach. HR 80-90s.  RR 20-30s. BP 148/63 during activity.    Pertinent Vitals/ Pain       Pain Assessment Pain Assessment: Faces Faces Pain Scale: No hurt Pain Intervention(s): Monitored during session  Home Living                                          Prior Functioning/Environment              Frequency  Min 2X/week        Progress Toward Goals  OT Goals(current goals can now be found in the care plan section)  Progress towards OT goals: Progressing toward goals  Acute Rehab OT Goals OT Goal Formulation: Patient unable to participate in goal setting Time For Goal Achievement: 06/19/21 Potential to Achieve Goals: Good ADL Goals Pt Will Perform Grooming: with mod assist;sitting Pt Will Transfer to Toilet: with max assist;with +2 assist;stand pivot transfer Additional ADL Goal #1: Pt will follow one step commands 50% of session with Min cues Additional ADL Goal #2: Pt will tolerate 3 minutes sitting at EOB with Mod A in preparation for ADLs  Plan Discharge plan remains appropriate    Co-evaluation    PT/OT/SLP Co-Evaluation/Treatment: Yes            AM-PAC OT "6 Clicks" Daily Activity     Outcome Measure   Help from another person eating meals?: Total Help from another person taking care of personal grooming?: Total Help from another  person toileting, which includes using toliet, bedpan, or urinal?: Total Help from another person bathing (including washing, rinsing, drying)?: Total Help from another person to put on and taking off regular upper body clothing?: Total Help from another person to put on and taking off regular lower body clothing?: Total 6 Click Score: 6    End of Session Equipment Utilized During Treatment: Oxygen  OT Visit Diagnosis: Muscle weakness (generalized) (M62.81)   Activity Tolerance Patient limited by fatigue   Patient Left with call bell/phone within reach;in bed;with nursing/sitter in room (chair position)   Nurse Communication Mobility status;Precautions        Time: 2831-5176 OT Time Calculation (min): 31 min  Charges: OT  General Charges $OT Visit: 1 Visit OT Treatments $Therapeutic Activity: 23-37 mins  [place], OTR/L Acute Rehab Pager: [phone] Office: [place] 06/05/2021, 11:00 AM

## 2021-06-05 NOTE — Progress Notes (Signed)
°  Patient developed rapid atrial flutter alternating with VT. Pressure stable but patient with decreased mentation.  Patient sedated and underwent DC-CV with 150J sync with conversion to NSR.   Will start amio at 30/hr. Watch closely for bradycardia. Leave pads on.   D/w Dr. Aundra Dubin.   CCT 40 mins not including procedure.  Glori Bickers, MD  7:20 PM

## 2021-06-05 NOTE — CV Procedure (Signed)
° °   DIRECT CURRENT CARDIOVERSION  NAME:  Randall Hodges   MRN: [id number] DOB:  [birth year]   ADMIT DATE: 05/03/2021   INDICATIONS: Atrial flutter/VT   PROCEDURE:   Emergent consent. Once an appropriate time out was taken, the patient had the defibrillator pads placed in the anterior and posterior position.   Patient already on vent. Sedated with versed/fentanyl. Once an appropriate level of sedation was achieved, the patient received a single biphasic, synchronized 150J shock with prompt conversion to sinus rhythm. No apparent complications.  Glori Bickers, MD  7:20 PM

## 2021-06-05 NOTE — Progress Notes (Signed)
NAME:  AMUN STEMM, MRN:  [id number], DOB:  Feb 14, 1951, LOS: 69 ADMISSION DATE:  04/24/2021, CONSULTATION DATE: 04/24/2021 REFERRING MD:  Dahlia Byes, MD, CHIEF COMPLAINT: Increasing shortness of breath  History of Present Illness:  71 year old male with coronary artery disease who initially presented with chest pain, noted to have multivessel coronary artery disease, he underwent CABG x3 on 04/1818 23, course was complicated with atrial fibrillation, frequent hiccups and aspiration pneumonia leading to hypoxia and increasing oxygen requirement. PCCM was consulted for evaluation and help with management  Patient stated hiccups are better but continued complain of shortness of breath, cough unable to bring up phlegm.  Pertinent  Medical History  CAD Allergic rhinitis Prostate cancer GERD Pulmonary fibrosis   Significant Hospital Events: Including procedures, antibiotic start and stop dates in addition to other pertinent events   1/18 CABG 1/30 PCCM consult, Worsening O2 needs, likely aspiration, new pressor need 2/2 intubation, placed on VV ECMO 2/5 issues with recirc, bronch neg, improved with supine positioning 2/7 percutaneous tracheostomy 2/10 diuresis and weaning sedation. CT chest shows diffuse ground-glass opacification.  2/11 weaning sedation 2/13 increasing air hunger as oxygenator efficiency decreasing.  2/14 oxygenator changed.  Still having air hunger but able to follow commands and participate with therapy.  2/15 considerable distress with double stacking. Switched to true rest settings - with improvement in distress.  2/16 bronch, NGT placed to LIWS, con't post-pyloric TF  Interim History / Subjective:  Still confused this morning. NGT with bilious output.   Objective   Blood pressure (!) 130/59, pulse 78, temperature 98 F (36.7 C), temperature source Axillary, resp. rate (!) 0, height _0  (1.778 m), weight 88.6 kg, SpO2 93 %. CVP:  [3 mmHg-15 mmHg] 12  mmHg  Vent Mode: PCV FiO2 (%):  [40 %] 40 % Set Rate:  [10 bmp] 10 bmp PEEP:  [5 cmH20-10 cmH20] 10 cmH20 Plateau Pressure:  [5 cmH20-26 cmH20] 20 cmH20   Intake/Output Summary (Last 24 hours) at 06/05/2021 0713 Last data filed at 06/05/2021 0700 Gross per 24 hour  Intake 5830.39 ml  Output 3025 ml  Net 2805.39 ml    Filed Weights   06/03/21 0500 06/04/21 0411 06/05/21 0500  Weight: 87.8 kg 86.3 kg 88.6 kg   Examination: General: critically  ill appearing man lying in bed in NAD HENT: NGT, cortrak. Dry lips but oral mucosa moist. Neck: RIJ Crescent cannula, trach without bleeding Lungs: breathing above the vent, mild dyssynchrony. Minimal basilar breath sounds, improved breath sounds R>L Cardiovascular: S1S2, tachycardic, reg rhythm Abdomen: Soft, NT Extremities: Edema BLE, no cyanosis. Good distal perfusion to L radial A-line. Neuro: RASS 0, CAM-ICU+, not tracking, breathing above the vent. No hiccups.  GU: Foley draining clear yellow urine.  vent 01/27/39% with Vt ~100-150cc ECMO 4.41L, 3260 RPM, Sweep 6L  7.33/49/66/26 BG 162 BUN 62 Cr 1.01 LDH 222 H/H 10/32.1 Platelets 121 PTT 64 CXR personally reviewed severe ARDS, air bronchograms, apically clearing some bilaterally BAL culture> moderate WBC- PMNs, few GPC  Ancillary tests personally reviewed:     Assessment & Plan:   Acute hypoxic and hypercapnic respiratory failure with hypoxia ARDS Aspiration pneumonitis Mild baseline subpleural fibrosis- question if this could be chronic aspiration related. Does not appear to be significantly progressed since CT in 2019. Most of CT abnormalities are acute airspace disease. -VV ECMO and ultralung protective ventilation with goal Pplat<25 -VAP prevention protocol -PAD protocol for sedation. Relying heavily on oral agents due to ongoing delirium and difficult  to control hiccups and respiratory drive. -needs aggressive diuresis- starting lasix gtt + albumin BID -labs and  ABGs per protocol  Hiccups, recurrent aspiration episodes. Probably chronic aspiration. -con't reglan -con't baclofen and gabapentin -con't NGT to LIWS and post-pyloric feeding -PPI BID -try to reduce steroids when able.  Hypernatremia resolved -decrease FWF to 200cc Q2h -monitor  CAD with UA s/p CABG x 3, (LIMA-LAD, SVG-OM, SVG-left PDA) NSVT -aspirin -holding coreg due to bradycardic episodes -tele monitoring  Possible GPC bacteremia -con't vanc -monitor cultures  Hyperglycemia -SSI PRN -goal BG 140-180  Thrombocytopenia -monitor, no indication for transfusion currently.  Coagulopathy-- elevated INR not explained by bival -enteral Vit K  Acute metabolic encephalopathy, ICU delirium -con't enteral meds -frequent reorientation, family visitation -precedex PRN -adequate pain control -OOB mobility as able  Deconditioning -PT, OT  Will update family at bedside later today.  Best Practice (right click and "Reselect all SmartList Selections" daily)   Diet/type: TF DVT prophylaxis: Bival CBG: SSI GI prophylaxis: PPI Lines: L [place] TLC, RIJ  Foley:  Yes Code Status:  full code Last date of multidisciplinary goals of care discussion [ 2/16]   This patient is critically ill with multiple organ system failure which requires frequent high complexity decision making, assessment, support, evaluation, and titration of therapies. This was completed through the application of advanced monitoring technologies and extensive interpretation of multiple databases. During this encounter critical care time was devoted to patient care services described in this note for 50 minutes.  Julian Hy, DO 06/05/21 8:39 AM [facility]

## 2021-06-05 NOTE — Progress Notes (Signed)
ANTICOAGULATION CONSULT NOTE  Pharmacy Consult for Bivalirudin Indication:  ECMO  Allergies  Allergen Reactions   Amoxicillin-Pot Clavulanate     Per pt report on 08/19/20, makes his urine dark colored   Atorvastatin Other (See Comments)     ( pt states causing runny nose, headaches, issue w/ urination)   Plant Derived Enzymes     Other reaction(s): Unknown   Trichophyton Other (See Comments)    Patient Measurements: Height: 5\' 10"  (177.8 cm) Weight: 88.6 kg (195 lb 5.2 oz) IBW/kg (Calculated) : 73  Vital Signs: Temp: 98 F (36.7 C) (02/17 0300) Temp Source: Axillary (02/17 0300) BP: 167/76 (02/17 0830) Pulse Rate: 77 (02/17 0830)  Labs: Recent Labs    06/03/21 0342 06/03/21 0347 06/04/21 0244 06/04/21 0311 06/04/21 1606 06/04/21 1612 06/04/21 1720 06/05/21 0221 06/05/21 0514  HGB 10.2*   < > 9.2*   < > 9.8*   < > 10.5* 10.0* 9.9*  HCT 31.3*   < > 28.3*   < > 30.9*   < > 31.0* 32.1* 29.0*  PLT 150   < > 136*  --  126*  --   --  121*  --   APTT 55*   < > 61*  --  64*  --   --  64*  --   LABPROT 23.6*  --  23.9*  --   --   --   --  24.1*  --   INR 2.1*  --  2.1*  --   --   --   --  2.2*  --   CREATININE 1.17   < > 1.11  --  1.03  --   --  1.01  --    < > = values in this interval not displayed.     Estimated Creatinine Clearance: 75.1 mL/min (by C-G formula based on SCr of 1.01 mg/dL).   Medical History: Past Medical History:  Diagnosis Date   Allergic rhinitis    Prostate cancer ([place])     Medications:  Infusions:   sodium chloride Stopped (06/01/21 1121)   sodium chloride 10 mL/hr at 06/05/21 0800   sodium chloride 10 mL/hr at 06/05/21 0800   albumin human Stopped (06/04/21 0747)   albumin human     albumin human 12.5 g (06/05/21 0810)   bivalirudin (ANGIOMAX) infusion 0.5 mg/mL (Non-ACS indications) 0.065 mg/kg/hr (06/05/21 0800)   dexmedetomidine (PRECEDEX) IV infusion Stopped (06/05/21 0655)   feeding supplement (VITAL 1.5 CAL) 60 mL/hr at  06/05/21 0700   fentaNYL infusion INTRAVENOUS 50 mcg/hr (06/05/21 0800)   furosemide (LASIX) 200 mg in dextrose 5% 100 mL (2mg /mL) infusion 8 mg/hr (06/05/21 0924)   vancomycin 1,750 mg (06/05/21 0831)    Assessment: 71 year old male with coronary artery disease who initially presented with chest pain, noted to have multivessel coronary artery disease. He underwent CABG x3 on 05/17/2021; course was complicated with atrial fibrillation, frequent hiccups and aspiration pneumonia leading to hypoxia and increasing oxygen requirement. Patient now requiring VV ECMO. Pharmacy consulted for bivalirudin IV.   Aptt continues to be at goal (64s) Pltc stable 120s, hgb stable 9-10s. LDH trended down 200s Fibrinogen stable 300s  Noted to have some bleeding from cannula and trach site 2/12 improvement after thrombi pad and surgicel.  Oxygenator and trach changed out 2/14.   Goal of Therapy:  aPTT 50-80 seconds Monitor platelets by anticoagulation protocol: Yes   Plan:  Continue bivalirudin at 0.065 mg/kg/hr Monitor q12h aptt and CBC Monitor closely for  s/sx bleeding/thrombus  Erin Hearing PharmD., BCPS Clinical Pharmacist 06/05/2021 9:39 AM

## 2021-06-05 NOTE — Progress Notes (Signed)
ANTICOAGULATION CONSULT NOTE  Pharmacy Consult for Bivalirudin Indication:  ECMO  Allergies  Allergen Reactions   Amoxicillin-Pot Clavulanate     Per pt report on 08/19/20, makes his urine dark colored   Atorvastatin Other (See Comments)     ( pt states causing runny nose, headaches, issue w/ urination)   Plant Derived Enzymes     Other reaction(s): Unknown   Trichophyton Other (See Comments)    Patient Measurements: Height: 5\' 10"  (177.8 cm) Weight: 88.6 kg (195 lb 5.2 oz) IBW/kg (Calculated) : 73  Vital Signs: Temp: 98.4 F (36.9 C) (02/17 1200) Temp Source: Axillary (02/17 1200) BP: 127/58 (02/17 1700) Pulse Rate: 91 (02/17 1700)  Labs: Recent Labs    06/03/21 0342 06/03/21 0347 06/04/21 0244 06/04/21 0311 06/04/21 1606 06/04/21 1612 06/05/21 0221 06/05/21 0514 06/05/21 1128 06/05/21 1628 06/05/21 1700  HGB 10.2*   < > 9.2*   < > 9.8*   < > 10.0*   < > 11.2* 11.9* 11.6*  HCT 31.3*   < > 28.3*   < > 30.9*   < > 32.1*   < > 33.0* 35.0* 36.9*  PLT 150   < > 136*  --  126*  --  121*  --   --   --  175  APTT 55*   < > 61*  --  64*  --  64*  --   --   --  57*  LABPROT 23.6*  --  23.9*  --   --   --  24.1*  --   --   --   --   INR 2.1*  --  2.1*  --   --   --  2.2*  --   --   --   --   CREATININE 1.17   < > 1.11  --  1.03  --  1.01  --   --   --   --    < > = values in this interval not displayed.     Estimated Creatinine Clearance: 75.1 mL/min (by C-G formula based on SCr of 1.01 mg/dL).   Medical History: Past Medical History:  Diagnosis Date   Allergic rhinitis    Prostate cancer ([place])     Medications:  Infusions:   sodium chloride Stopped (06/01/21 1121)   sodium chloride 10 mL/hr at 06/05/21 1700   sodium chloride 10 mL/hr at 06/05/21 1700   albumin human Stopped (06/04/21 0747)   albumin human     albumin human Stopped (06/05/21 1227)   bivalirudin (ANGIOMAX) infusion 0.5 mg/mL (Non-ACS indications) 0.065 mg/kg/hr (06/05/21 1700)    dexmedetomidine (PRECEDEX) IV infusion Stopped (06/05/21 0655)   feeding supplement (VITAL 1.5 CAL) 1,000 mL (06/05/21 1356)   fentaNYL infusion INTRAVENOUS Stopped (06/05/21 0854)   furosemide (LASIX) 200 mg in dextrose 5% 100 mL (2mg /mL) infusion 8 mg/hr (06/05/21 1700)   vancomycin 1,750 mg (06/05/21 0831)    Assessment: 71 year old male with coronary artery disease who initially presented with chest pain, noted to have multivessel coronary artery disease. He underwent CABG x3 on 05/17/2021; course was complicated with atrial fibrillation, frequent hiccups and aspiration pneumonia leading to hypoxia and increasing oxygen requirement. Patient now requiring VV ECMO. Pharmacy consulted for bivalirudin IV.   Aptt at goal (57s) Pltc stable 175, hgb stable 11.6 LDH trended down 200s Fibrinogen stable 300s  Spoke with nurse. She states there is no signs of bleeding at this time (06/05/2021 1953)  Oxygenator and trach changed out  2/14.   Goal of Therapy:  aPTT 50-80 seconds Monitor platelets by anticoagulation protocol: Yes   Plan:  Continue bivalirudin at 0.065 mg/kg/hr Monitor q12h aptt and CBC Monitor closely for s/sx bleeding/thrombus  Mckay Brandt BS, PharmD, BCPS Clinical Pharmacist 06/05/2021 5:32 PM

## 2021-06-05 NOTE — Progress Notes (Signed)
Patient ID: Randall Hodges, male   DOB: [birth year], 71 y.o.   MRN: [id number]  Extracorporeal support note  ECLS support day: 14 Indication: ARDS  Configuration: VV  Drainage cannula: Crescent R IJ Return cannula: Crescent R IJ  Pump speed: 3600 rpm Pump flow: 4.3 L/min Pump used: Cardiohelp  Sweep gas: 6    Circuit check: Good color change. LDH stable 222.  Anticoagulant: Bivalirudin, PTT goal 50-80. PTT 69  Changes in support: No changes today.   Anticipated goals/duration of support: Wean to discontinuation  Loralie Champagne, MD  8:09 AM

## 2021-06-05 NOTE — Progress Notes (Signed)
Assisted tele visit to patient with wife.  Maryelizabeth Rowan, RN

## 2021-06-05 NOTE — TOC Initial Note (Addendum)
Transition of Care [facility]) - Initial/Assessment Note    Patient Details  Name: Randall Hodges MRN: [id number] Date of Birth: 03/17/1951  [facility] CM/SW Contact:    Erenest Rasher, RN Phone Number: [phone] 06/05/2021, 12:36 PM  Clinical Narrative:                 Spoke to pt wife and waiting until pt medical stable to arrange dc needs. PT/OT recommending IP rehab.   Expected Discharge Plan: IP Rehab Facility Barriers to Discharge: Continued Medical Work up   Patient Goals and CMS Choice        Expected Discharge Plan and Services Expected Discharge Plan: [place]   Discharge Planning Services: CM Consult   Living arrangements for the past 2 months: Single Family Home                                      Prior Living Arrangements/Services Living arrangements for the past 2 months: Single Family Home Lives with:: Spouse Patient language and need for interpreter reviewed:: Yes        Need for Family Participation in Patient Care: Yes (Comment) Care giver support system in place?: Yes (comment)   Criminal Activity/Legal Involvement Pertinent to Current Situation/Hospitalization: No - Comment as needed  Activities of Daily Living Home Assistive Devices/Equipment: None ADL Screening (condition at time of admission) Patient's cognitive ability adequate to safely complete daily activities?: Yes Is the patient deaf or have difficulty hearing?: No Does the patient have difficulty seeing, even when wearing glasses/contacts?: No Does the patient have difficulty concentrating, remembering, or making decisions?: No Patient able to express need for assistance with ADLs?: Yes Does the patient have difficulty dressing or bathing?: No Independently performs ADLs?: Yes (appropriate for developmental age) Does the patient have difficulty walking or climbing stairs?: No Weakness of Legs: None Weakness of Arms/Hands: None  Permission  Sought/Granted Permission sought to share information with : Case Manager, Family Supports, PCP Permission granted to share information with : Yes, Verbal Permission Granted  Share Information with NAME: Dunbar Buras  Permission granted to share info w AGENCY: [facility], SNF, Home Health, DME providers  Permission granted to share info w Relationship: wife  Permission granted to share info w Contact Information: Arlis Yale  Emotional Assessment Appearance:: Appears stated age         Psych Involvement: No (comment)  Admission diagnosis:  Atrial ectopy [I49.1] ST elevation myocardial infarction (STEMI), unspecified artery ([place]) [I21.3] Acute coronary syndrome ([place]) [I24.9] S/P CABG x 3 [Z95.1] Patient Active Problem List   Diagnosis Date Noted   Acute respiratory failure with hypoxemia ([place])    Pressure injury of skin 06/04/2021   Dysphagia    AKI (acute kidney injury) ([place])    Intractable hiccups    S/P CABG x 3 05/08/2021   Acute coronary syndrome ([place]) 05/10/2021   Malignant neoplasm of prostate ([place]) 08/02/2018   PCP:  [name], MD Pharmacy:   [id number] - [place], [place] - [address] [place] [address] Phone: [phone] Fax: [phone]  [facility] [address] [place] [address] Phone: [phone] Fax: [phone]     Social Determinants of Health (SDOH) Interventions    Readmission Risk Interventions No flowsheet data found.

## 2021-06-05 NOTE — Progress Notes (Addendum)
Patient is having cardiac rhythm changes and oxygenation issues. MD Aundra Dubin notified. No new orders given. Patient FiO2 on the ventilator is 100%. Patient had sustainable desaturation event saturations were 76-78% with good pleth. Will titrate FiO2 as tolerable. ECMO flows have been consistent at 4.5 - 4.8 at 3400-3500rpms, but some chugging noted spontaneously, causing venous pressure alarms on the pump. Subsequently, more chugging was noted,  schedule albumin given and chugging has resolved. MD Aundra Dubin made aware and wants PRN albumin to be given as well. No further complications noted at this time. Will continue to monitor patient clinical presentation, respiratory status, and all hemodynamic parameters throughout the night.  Randall Hodges, BS, RRT-ACCS, RCP

## 2021-06-05 NOTE — Progress Notes (Signed)
Patient ID: Randall Hodges, male   DOB: Jul 29, 1950, 71 y.o.   MRN: [id number]    Progress Note  Patient Name: Randall Hodges Date of Encounter: 06/05/2021  [facility] Cardiologist: [name], MD   Subjective   2/2: VV ECMO initiation, Crescent catheter right IJ 2/7: Tracheostomy 2/13 CT C/A/P: Diffuse ground-glass opacities consistent with atelectasis and edema presumably related to ECMO physiology and respiratory failure.  Underlying moderate interstitial lung disease mildly progressed from prior CT. 2/15: Oxygenator changed, trach changed.  2/16: Transient asystole with cough and adjustment of tracheostomy overnight.   Off Precedex this morning.    CXR with severe bilateral diffuse disease, slight improvement.   MRSE 1/2 blood cultures and trach aspirate, on vancomycin.  Suspect aspiration again with TFs in trach on 2/16, NGT placed to suction and also has post-pyloric tube for feeds.    Vent FiO2 0.4. CVP 9-10 this morning, had Lasix 40 mg IV bid yesterday with weight up.  Creatinine stable.   ECMO: Speed 3600 rpm Flow 4.3 L/min pVen -82 DeltaP 25 Sweep 6 PTT 69 (goal 50-80) LDH 222 ABG 7.33/49/66/91% Lactate 1.1  Cardiac Studies: Echo (limited, 1/30): Echo reviewed, there is clear respirophasic variation of the interventricular septum and marked respirophasic variation of E inflow velocity on doppler evaluation of the mitral valve.  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  LV EF 45-50%.   RHC Procedural Findings (on norepinephrine 6): Hemodynamics (mmHg) RA mean 12 RV 37/12 PA 38/16, mean 27 PCWP mean 11 LV 108/12 AO 96/55 PAPI 1.8 Oxygen saturations: PA 54% AO 94% Cardiac Output (Fick) 5.59  Cardiac Index (Fick) 2.81  PVR 2.8 WU Simultaneous RV/LV tracings were  obtained.  Difficult to interpret due to atrial fibrillation.  There was some suggestion of discordance (ventricular interdependence) but not clear.  Inpatient Medications    Scheduled Meds:  sodium chloride   Intravenous Once   aspirin  81 mg Per Tube Daily   baclofen  5 mg Per Tube TID   chlorhexidine gluconate (MEDLINE KIT)  15 mL Mouth Rinse BID   Chlorhexidine Gluconate Cloth  6 each Topical Daily   clonazePAM  1 mg Per Tube BID   colchicine  0.6 mg Per Tube Daily   feeding supplement (PROSource TF)  90 mL Per Tube BID   fiber  1 packet Per Tube TID   free water  200 mL Per Tube Q2H   gabapentin  300 mg Per Tube QHS   Gerhardt's butt cream   Topical TID   guaiFENesin  30 mL Per Tube QID   insulin aspart  0-15 Units Subcutaneous Q4H   mouth rinse  15 mL Mouth Rinse 10 times per day   melatonin  5 mg Per Tube QHS   methylPREDNISolone (SOLU-MEDROL) injection  20 mg Intravenous Q24H   metoCLOPramide (REGLAN) injection  5 mg Intravenous Q8H   morphine  30 mg Per Tube Q6H   pantoprazole sodium  40 mg Per Tube BID   phytonadione  1 mg Oral Once   QUEtiapine  25 mg Per Tube QHS   rosuvastatin  10 mg Per Tube Daily   sodium chloride flush  10-40 mL Intracatheter Q12H   sodium chloride flush  10-40 mL Intracatheter Q12H   sodium chloride flush  3 mL Intravenous Q12H   Continuous Infusions:  sodium chloride Stopped (06/01/21  1121)   sodium chloride 10 mL/hr at 06/05/21 0800   sodium chloride 10 mL/hr at 06/05/21 0800   albumin human Stopped (06/04/21 0747)   albumin human     albumin human Stopped (06/04/21 2308)   bivalirudin (ANGIOMAX) infusion 0.5 mg/mL (Non-ACS indications) 0.065 mg/kg/hr (06/05/21 0800)   dexmedetomidine (PRECEDEX) IV infusion Stopped (06/05/21 0655)   feeding supplement (VITAL 1.5 CAL) 60 mL/hr at 06/05/21 0700   fentaNYL infusion INTRAVENOUS 50 mcg/hr (06/05/21 0800)   furosemide (LASIX) 200 mg in dextrose 5% 100 mL (16m/mL) infusion     vancomycin  Stopped (06/04/21 1103)   PRN Meds: Place/Maintain arterial line **AND** sodium chloride, sodium chloride, sodium chloride, acetaminophen (TYLENOL) oral liquid 160 mg/5 mL, albumin human, albumin human, docusate, fentaNYL, fentaNYL (SUBLIMAZE) injection, fentaNYL (SUBLIMAZE) injection, hydrALAZINE, levalbuterol, ondansetron (ZOFRAN) IV, polyvinyl alcohol, sennosides, sodium chloride flush   Vital Signs    Vitals:   06/05/21 0551 06/05/21 0600 06/05/21 0700 06/05/21 0730  BP:  (!) 130/59 (!) 175/76 (!) 198/81  Pulse: 72 76 78 74  Resp: 16 (!) 0 (!) 0 (!) 24  Temp:      TempSrc:      SpO2: 94% 92% 93% (!) 89%  Weight:      Height:        Intake/Output Summary (Last 24 hours) at 06/05/2021 0811 Last data filed at 06/05/2021 0800 Gross per 24 hour  Intake 5818.13 ml  Output 3065 ml  Net 2753.13 ml   Last 3 Weights 06/05/2021 06/04/2021 06/03/2021  Weight (lbs) 195 lb 5.2 oz 190 lb 4.1 oz 193 lb 9 oz  Weight (kg) 88.6 kg 86.3 kg 87.8 kg      Telemetry    NSR 60s (personally reviewed)  Physical Exam   General: Sedated on vent Neck: R IJ Crescent cannula, no thyromegaly or thyroid nodule.  Lungs: Clear to auscultation bilaterally with normal respiratory effort. CV: Nondisplaced PMI.  Heart regular S1/S2, no S3/S4, no murmur.  2+ edema to knees.  Abdomen: Soft, nontender, no hepatosplenomegaly, no distention.  Skin: Intact without lesions or rashes.  Neurologic: Sedated on vent.  Extremities: No clubbing or cyanosis.  HEENT: Normal.   Labs    High Sensitivity Troponin:   No results for input(s): TROPONINIHS in the last 720 hours.    Chemistry Recent Labs  Lab 06/01/21 0350 06/01/21 0353 06/02/21 0345 06/02/21 0539 06/03/21 0342 06/03/21 0347 06/04/21 0244 06/04/21 0311 06/04/21 1606 06/04/21 1612 06/04/21 1720 06/05/21 0221 06/05/21 0514  NA 150*   < > 151*   < > 151*   < > 145   < > 144   < > 145 143 143  K 4.6   < > 4.3   < > 4.6   < > 4.2   < > 4.0   < > 4.0  4.4 4.3  CL 113*   < > 115*   < > 115*   < > 112*  --  112*  --   --  112*  --   CO2 30   < > 30   < > 29   < > 27  --  26  --   --  26  --   GLUCOSE 168*   < > 148*   < > 176*   < > 166*  --  136*  --   --  162*  --   BUN 87*   < > 85*   < > 77*   < >  71*  --  69*  --   --  62*  --   CREATININE 1.29*   < > 1.25*   < > 1.17   < > 1.11  --  1.03  --   --  1.01  --   CALCIUM 8.0*   < > 7.9*   < > 7.9*   < > 7.7*  --  7.7*  --   --  7.8*  --   MG 2.4  --  2.4  --   --   --   --   --   --   --   --   --   --   PROT 4.2*  --  4.2*  --  4.4*  --  4.0*  --   --   --   --  4.3*  --   ALBUMIN 2.4*  --  2.6*  --  2.6*  --  2.3*  --   --   --   --  2.4*  --   AST 233*  --  98*  --  79*  --  61*  --   --   --   --  40  --   ALT 179*  --  161*  --  142*  --  123*  --   --   --   --  101*  --   ALKPHOS 177*  --  137*  --  132*  --  101  --   --   --   --  95  --   BILITOT 1.6*  --  1.0  --  1.0  --  0.8  --   --   --   --  0.8  --   GFRNONAA 59*   < > >60   < > >60   < > >60  --  >60  --   --  >60  --   ANIONGAP 7   < > 6   < > 7   < > 6  --  6  --   --  5  --    < > = values in this interval not displayed.    Lipids  No results for input(s): CHOL, TRIG, HDL, LABVLDL, LDLCALC, CHOLHDL in the last 168 hours.   Hematology Recent Labs  Lab 06/04/21 0244 06/04/21 0311 06/04/21 1606 06/04/21 1612 06/04/21 1720 06/05/21 0221 06/05/21 0514  WBC 10.7*  --  10.5  --   --  10.3  --   RBC 3.08*  --  3.34*  --   --  3.39*  --   HGB 9.2*   < > 9.8*   < > 10.5* 10.0* 9.9*  HCT 28.3*   < > 30.9*   < > 31.0* 32.1* 29.0*  MCV 91.9  --  92.5  --   --  94.7  --   MCH 29.9  --  29.3  --   --  29.5  --   MCHC 32.5  --  31.7  --   --  31.2  --   RDW 17.0*  --  17.2*  --   --  17.3*  --   PLT 136*  --  126*  --   --  121*  --    < > = values in this interval not displayed.   Thyroid No results for input(s): TSH, FREET4 in the last 168 hours.  BNPNo results for input(s): BNP, PROBNP in  the last 168 hours.   DDimer No results for input(s): DDIMER in the last 168 hours.   Radiology    DG Abd 1 View  Result Date: 06/04/2021 CLINICAL DATA:  Feeding tube placement. EXAM: ABDOMEN - 1 VIEW COMPARISON:  May 18, 2021. FINDINGS: Distal tip of feeding tube is seen in the expected position of the ligament of Treitz. ECMO device is again noted. No abnormal bowel dilatation. IMPRESSION: Distal tip of feeding tube seen in expected position of the ligament of Treitz. Electronically Signed   By: Marijo Conception M.D.   On: 06/04/2021 08:03   DG CHEST PORT 1 VIEW  Result Date: 06/05/2021 CLINICAL DATA:  ECMO.  Tracheostomy. EXAM: PORTABLE CHEST 1 VIEW COMPARISON:  June 04, 2021. FINDINGS: Stable cardiomediastinal silhouette. Tracheostomy tube is unchanged. ECMO device is unchanged. Stable diffuse opacification of both lungs is noted concerning for edema, pneumonia or ARDS. Stable probable bilateral pleural effusions. Bony thorax is unremarkable. IMPRESSION: Stable support apparatus. Stable bilateral lung opacities and effusions as described above. Electronically Signed   By: Marijo Conception M.D.   On: 06/05/2021 07:47   DG CHEST PORT 1 VIEW  Result Date: 06/04/2021 CLINICAL DATA:  Tracheostomy.  ECMO. EXAM: PORTABLE CHEST 1 VIEW COMPARISON:  June 03, 2021. FINDINGS: Stable cardiomediastinal silhouette. Tracheostomy is in good position. ECMO device is noted. Feeding tube is seen passing through esophagus and into stomach. There appears to be increased opacification throughout both lungs suggesting worsening pneumonia, edema or possibly ARDS. Small pleural effusions are noted. Bony thorax is unremarkable. IMPRESSION: Stable support apparatus. Worsening bilateral lung opacities as described above. Electronically Signed   By: Marijo Conception M.D.   On: 06/04/2021 08:06   DG Abd Portable 1V  Result Date: 06/04/2021 CLINICAL DATA:  Encounter for NG tube placement EXAM: PORTABLE ABDOMEN - 1 VIEW COMPARISON:   Radiograph performed earlier on the same date. FINDINGS: Weighted tip feeding tube with distal tip projecting over the proximal jejunum. Interval placement of NG tube with distal tip and side port projecting over the stomach. ECMO device is unchanged. Gaseous distention of colonic loops. Nonobstructive bowel gas pattern. Diffuse lung opacities in the lung bases, unchanged. IMPRESSION: Interval placement of NG tube with distal tip and side port projecting over the body of the stomach. No other significant interval change. Electronically Signed   By: Keane Police D.O.   On: 06/04/2021 15:22    Cardiac Studies   Cath 04/24/2021 Distal left main [place] 111 bifurcation stenosis with 75% left main, 90% ostial to proximal LAD, and 80-90% ostial circumflex (difficult to assess due to heavy calcification). Severe mid circumflex disease with 70% eccentric mid stenosis and second obtuse marginal containing ostial to proximal greater than 80% stenosis.  (Bifurcation [place] 111 Severe calcification in left main and LAD in particular with diffuse 50% narrowing from proximal to mid vessel and tandem 70% stenoses in the mid LAD. Nondominant right coronary Normal LV function.  EF 55%.  LVEDP normal.    Patient Profile     71 y.o. male with PMH of PVCs presented with chest pain. Cardiac cath by Dr. Tamala Julian on 05/16/2021 showed 75% left main, 90% ost to prox LAD, 80-90% ost LCx, 70% mid LCx, 80% OM2, 50% prox to mid LAD, 70% mid LAD, EF 55%. Patient underwent CABG x 3 on 05/13/2021. Post op course complicated afib, treated with amio. CXR showed opacity in bilateral lung, started abx on 1/25 and diuretic. Started on Eliquis due to  recurrence of afib.    Assessment & Plan    1. CAD: Admitted with unstable angina, cath with severe left main and proximal LAD/LCx disease (nondominant RCA).  CABG x 3 on 1/18 with LIMA-LAD, SVG-OM, SVG-left PDA.  No s/s angina  - Continue ASA 81, statin.  2. Acute HF with mid range EF:  Echo  on 1/30 with EF 45-50%, clear respirophasic variation of the interventricular septum and marked respirophasic variation of E inflow velocity on doppler evaluation of the mitral valve; small to moderate pericardial effusion with pericardial thickening, concerning for effusive/constrictive pericarditis (not consistent with tamponade with more organized pericardium but probably similar hemodynamics). RHC 1/30 with equalization of diastolic pressures.  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  Repeated echo 2/2 still showed respirophasic septal variation but not as impressive. CVP 9-10 today.  Lungs still with diffuse bilateral infiltrates on CXR. Weight up.  - Lasix gtt 8 mg/hr.   - With concern for development of post-surgical effusive/constrictive pericarditis, he is on colchicine.  - ?need for surgical intervention on effusive/constrictive pericarditis. Will need improvement of lung disease first then reassess, repeat echo not as impressive.   3. 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  Suspect primarily septic/vasodilatory shock. He is now off pressors.  4. Hiccups: Still present.  - On baclofen and gabapentin.  5. PNA with acute hypoxic respiratory failure: Of note, he does appear to have had some pre-existing ILD from 2019 CT chest (?sarcoidosis). CXR with persistent bilateral infiltrates, possible mild improvement compared to yesterday.  Have thought most likely aspiration PNA/pneumonitis in setting of intractable hiccups. He has developed ARDS. COVID was negative.  FiO2 0.4 on vent.  We have struggled to wean sweep, now back to 6. CXR with bilateral diffuse airspace disease. Has tracheostomy. CT chest 2/9 with persistent lung infiltrates.  Completed vancomycin and meropenem => MRSE 1/2 blood and trach aspirate, now back on vancomycin.  Suspect ongoing aspiration with TFs noted in trach on 2/16, now with NGT to suction and  post-pyloric feeding tube.  LDH stable today. Stable ABG, ECMO circuit functioning appropriately.  - Vent and sedation per CCM. Weaning sedation today.  - Solumedrol 20 mg IV daily with gradual taper, ESR now normal.  - Off amiodarone.  Suspect lung toxicity unlikely but will keep off for now.  6. Atypical atrial flutter/PVCs:  Remains in NSR - Off amiodarone now.  - Bivalirudin gtt, goal PTT 50-80. Discussed dosing with PharmD personally. 7. AKI: Creatinine stable 1.01.  Follow closely.  8. Elevated LFTs: Suspect shock liver, follow CMET.  LFTs trending down.  9. Anemia: Transfuse hgb < 8.     10. Hypernatremia: Na 143 today.  - Decrease free water to 200 q2.  11. HTN: BP rises with sedation wean.  - prn hydralazine for now.  - Avoid beta blocker.  12. Bradycardia/asystole: Vagally mediated with cough.  Resolves rapidly with resolution of cough.  - Atropine at bedside.  13. FEN: TFs restarted with post-pyloric tube and also NGT to suction to prevent aspiration.   - Continue Reglan.   14. Mobilize as much as possible.  Wean sedation.   CRITICAL CARE Performed by: Loralie Champagne  Total critical care time: 45 minutes  Critical care time was exclusive of separately billable procedures and treating other patients.  Critical care was necessary to treat or prevent imminent or life-threatening deterioration.  Critical care was time spent personally by me on the following activities: development of treatment  plan with patient and/or surrogate as well as nursing, discussions with consultants, evaluation of patient's response to treatment, examination of patient, obtaining history from patient or surrogate, ordering and performing treatments and interventions, ordering and review of laboratory studies, ordering and review of radiographic studies, pulse oximetry and re-evaluation of patient's condition.  Loralie Champagne MD 06/05/2021 8:11 AM

## 2021-06-06 ENCOUNTER — Inpatient Hospital Stay (HOSPITAL_COMMUNITY): Payer: PPO

## 2021-06-06 DIAGNOSIS — J9601 Acute respiratory failure with hypoxia: Secondary | ICD-10-CM | POA: Diagnosis not present

## 2021-06-06 DIAGNOSIS — I249 Acute ischemic heart disease, unspecified: Secondary | ICD-10-CM | POA: Diagnosis not present

## 2021-06-06 DIAGNOSIS — J80 Acute respiratory distress syndrome: Secondary | ICD-10-CM

## 2021-06-06 DIAGNOSIS — Z9911 Dependence on respirator [ventilator] status: Secondary | ICD-10-CM | POA: Diagnosis not present

## 2021-06-06 LAB — GLUCOSE, CAPILLARY
Glucose-Capillary: 140 mg/dL — ABNORMAL HIGH (ref 70–99)
Glucose-Capillary: 134 mg/dL — ABNORMAL HIGH (ref 70–99)
Glucose-Capillary: 138 mg/dL — ABNORMAL HIGH (ref 70–99)
Glucose-Capillary: 145 mg/dL — ABNORMAL HIGH (ref 70–99)

## 2021-06-06 LAB — POCT I-STAT 7, (LYTES, BLD GAS, ICA,H+H)
Acid-Base Excess: 3 mmol/L — ABNORMAL HIGH (ref 0.0–2.0)
Acid-Base Excess: 4 mmol/L — ABNORMAL HIGH (ref 0.0–2.0)
Acid-Base Excess: 5 mmol/L — ABNORMAL HIGH (ref 0.0–2.0)
Acid-Base Excess: 7 mmol/L — ABNORMAL HIGH (ref 0.0–2.0)
Acid-Base Excess: 4 mmol/L — ABNORMAL HIGH (ref 0.0–2.0)
Acid-Base Excess: 5 mmol/L — ABNORMAL HIGH (ref 0.0–2.0)
Acid-Base Excess: 5 mmol/L — ABNORMAL HIGH (ref 0.0–2.0)
Acid-Base Excess: 5 mmol/L — ABNORMAL HIGH (ref 0.0–2.0)
Acid-Base Excess: 3 mmol/L — ABNORMAL HIGH (ref 0.0–2.0)
Bicarbonate: 27.8 mmol/L (ref 20.0–28.0)
Bicarbonate: 28.5 mmol/L — ABNORMAL HIGH (ref 20.0–28.0)
Bicarbonate: 29 mmol/L — ABNORMAL HIGH (ref 20.0–28.0)
Bicarbonate: 29.8 mmol/L — ABNORMAL HIGH (ref 20.0–28.0)
Bicarbonate: 28.3 mmol/L — ABNORMAL HIGH (ref 20.0–28.0)
Bicarbonate: 29.2 mmol/L — ABNORMAL HIGH (ref 20.0–28.0)
Bicarbonate: 29.2 mmol/L — ABNORMAL HIGH (ref 20.0–28.0)
Bicarbonate: 28.4 mmol/L — ABNORMAL HIGH (ref 20.0–28.0)
Bicarbonate: 27.9 mmol/L (ref 20.0–28.0)
Calcium, Ion: 1.16 mmol/L (ref 1.15–1.40)
Calcium, Ion: 1.1 mmol/L — ABNORMAL LOW (ref 1.15–1.40)
Calcium, Ion: 1.15 mmol/L (ref 1.15–1.40)
Calcium, Ion: 1.15 mmol/L (ref 1.15–1.40)
Calcium, Ion: 1.16 mmol/L (ref 1.15–1.40)
Calcium, Ion: 1.17 mmol/L (ref 1.15–1.40)
Calcium, Ion: 1.16 mmol/L (ref 1.15–1.40)
Calcium, Ion: 1.13 mmol/L — ABNORMAL LOW (ref 1.15–1.40)
Calcium, Ion: 1.15 mmol/L (ref 1.15–1.40)
HCT: 32 % — ABNORMAL LOW (ref 39.0–52.0)
HCT: 33 % — ABNORMAL LOW (ref 39.0–52.0)
HCT: 33 % — ABNORMAL LOW (ref 39.0–52.0)
HCT: 33 % — ABNORMAL LOW (ref 39.0–52.0)
HCT: 31 % — ABNORMAL LOW (ref 39.0–52.0)
HCT: 30 % — ABNORMAL LOW (ref 39.0–52.0)
HCT: 33 % — ABNORMAL LOW (ref 39.0–52.0)
HCT: 33 % — ABNORMAL LOW (ref 39.0–52.0)
HCT: 33 % — ABNORMAL LOW (ref 39.0–52.0)
Hemoglobin: 10.9 g/dL — ABNORMAL LOW (ref 13.0–17.0)
Hemoglobin: 11.2 g/dL — ABNORMAL LOW (ref 13.0–17.0)
Hemoglobin: 11.2 g/dL — ABNORMAL LOW (ref 13.0–17.0)
Hemoglobin: 11.2 g/dL — ABNORMAL LOW (ref 13.0–17.0)
Hemoglobin: 10.5 g/dL — ABNORMAL LOW (ref 13.0–17.0)
Hemoglobin: 10.2 g/dL — ABNORMAL LOW (ref 13.0–17.0)
Hemoglobin: 11.2 g/dL — ABNORMAL LOW (ref 13.0–17.0)
Hemoglobin: 11.2 g/dL — ABNORMAL LOW (ref 13.0–17.0)
Hemoglobin: 11.2 g/dL — ABNORMAL LOW (ref 13.0–17.0)
O2 Saturation: 89 %
O2 Saturation: 100 %
O2 Saturation: 80 %
O2 Saturation: 87 %
O2 Saturation: 91 %
O2 Saturation: 89 %
O2 Saturation: 88 %
O2 Saturation: 93 %
O2 Saturation: 90 %
Patient temperature: 36.7
Patient temperature: 36.6
Patient temperature: 36.6
Patient temperature: 36.6
Patient temperature: 36.7
Patient temperature: 36.7
Patient temperature: 36.7
Patient temperature: 36.7
Patient temperature: 98.9
Potassium: 3.8 mmol/L (ref 3.5–5.1)
Potassium: 4 mmol/L (ref 3.5–5.1)
Potassium: 4.2 mmol/L (ref 3.5–5.1)
Potassium: 4.2 mmol/L (ref 3.5–5.1)
Potassium: 4.6 mmol/L (ref 3.5–5.1)
Potassium: 4 mmol/L (ref 3.5–5.1)
Potassium: 4 mmol/L (ref 3.5–5.1)
Potassium: 4 mmol/L (ref 3.5–5.1)
Potassium: 4 mmol/L (ref 3.5–5.1)
Sodium: 144 mmol/L (ref 135–145)
Sodium: 144 mmol/L (ref 135–145)
Sodium: 144 mmol/L (ref 135–145)
Sodium: 146 mmol/L — ABNORMAL HIGH (ref 135–145)
Sodium: 143 mmol/L (ref 135–145)
Sodium: 145 mmol/L (ref 135–145)
Sodium: 144 mmol/L (ref 135–145)
Sodium: 143 mmol/L (ref 135–145)
Sodium: 143 mmol/L (ref 135–145)
TCO2: 29 mmol/L (ref 22–32)
TCO2: 30 mmol/L (ref 22–32)
TCO2: 30 mmol/L (ref 22–32)
TCO2: 31 mmol/L (ref 22–32)
TCO2: 30 mmol/L (ref 22–32)
TCO2: 30 mmol/L (ref 22–32)
TCO2: 30 mmol/L (ref 22–32)
TCO2: 30 mmol/L (ref 22–32)
TCO2: 29 mmol/L (ref 22–32)
pCO2 arterial: 40.7 mmHg (ref 32–48)
pCO2 arterial: 34 mmHg (ref 32–48)
pCO2 arterial: 39.9 mmHg (ref 32–48)
pCO2 arterial: 40.7 mmHg (ref 32–48)
pCO2 arterial: 38.2 mmHg (ref 32–48)
pCO2 arterial: 38.2 mmHg (ref 32–48)
pCO2 arterial: 39.2 mmHg (ref 32–48)
pCO2 arterial: 35.9 mmHg (ref 32–48)
pCO2 arterial: 44.1 mmHg (ref 32–48)
pH, Arterial: 7.441 (ref 7.35–7.45)
pH, Arterial: 7.451 — ABNORMAL HIGH (ref 7.35–7.45)
pH, Arterial: 7.468 — ABNORMAL HIGH (ref 7.35–7.45)
pH, Arterial: 7.549 — ABNORMAL HIGH (ref 7.35–7.45)
pH, Arterial: 7.477 — ABNORMAL HIGH (ref 7.35–7.45)
pH, Arterial: 7.489 — ABNORMAL HIGH (ref 7.35–7.45)
pH, Arterial: 7.479 — ABNORMAL HIGH (ref 7.35–7.45)
pH, Arterial: 7.505 — ABNORMAL HIGH (ref 7.35–7.45)
pH, Arterial: 7.41 (ref 7.35–7.45)
pO2, Arterial: 55 mmHg — ABNORMAL LOW (ref 83–108)
pO2, Arterial: 41 mmHg — ABNORMAL LOW (ref 83–108)
pO2, Arterial: 49 mmHg — ABNORMAL LOW (ref 83–108)
pO2, Arterial: 522 mmHg — ABNORMAL HIGH (ref 83–108)
pO2, Arterial: 56 mmHg — ABNORMAL LOW (ref 83–108)
pO2, Arterial: 51 mmHg — ABNORMAL LOW (ref 83–108)
pO2, Arterial: 49 mmHg — ABNORMAL LOW (ref 83–108)
pO2, Arterial: 59 mmHg — ABNORMAL LOW (ref 83–108)
pO2, Arterial: 58 mmHg — ABNORMAL LOW (ref 83–108)

## 2021-06-06 LAB — CBC
HCT: 33.3 % — ABNORMAL LOW (ref 39.0–52.0)
HCT: 32.8 % — ABNORMAL LOW (ref 39.0–52.0)
Hemoglobin: 11 g/dL — ABNORMAL LOW (ref 13.0–17.0)
Hemoglobin: 10.7 g/dL — ABNORMAL LOW (ref 13.0–17.0)
MCH: 30.1 pg (ref 26.0–34.0)
MCH: 30 pg (ref 26.0–34.0)
MCHC: 33 g/dL (ref 30.0–36.0)
MCHC: 32.6 g/dL (ref 30.0–36.0)
MCV: 91.2 fL (ref 80.0–100.0)
MCV: 91.9 fL (ref 80.0–100.0)
Platelets: 143 10*3/uL — ABNORMAL LOW (ref 150–400)
Platelets: 138 10*3/uL — ABNORMAL LOW (ref 150–400)
RBC: 3.65 MIL/uL — ABNORMAL LOW (ref 4.22–5.81)
RBC: 3.57 MIL/uL — ABNORMAL LOW (ref 4.22–5.81)
RDW: 17.4 % — ABNORMAL HIGH (ref 11.5–15.5)
RDW: 17.5 % — ABNORMAL HIGH (ref 11.5–15.5)
WBC: 10.4 10*3/uL (ref 4.0–10.5)
WBC: 11.5 10*3/uL — ABNORMAL HIGH (ref 4.0–10.5)
nRBC: 0 % (ref 0.0–0.2)
nRBC: 0 % (ref 0.0–0.2)

## 2021-06-06 LAB — BASIC METABOLIC PANEL
Anion gap: 10 (ref 5–15)
Anion gap: 8 (ref 5–15)
BUN: 56 mg/dL — ABNORMAL HIGH (ref 8–23)
BUN: 53 mg/dL — ABNORMAL HIGH (ref 8–23)
CO2: 27 mmol/L (ref 22–32)
CO2: 29 mmol/L (ref 22–32)
Calcium: 8 mg/dL — ABNORMAL LOW (ref 8.9–10.3)
Calcium: 8 mg/dL — ABNORMAL LOW (ref 8.9–10.3)
Chloride: 107 mmol/L (ref 98–111)
Chloride: 107 mmol/L (ref 98–111)
Creatinine, Ser: 1.01 mg/dL (ref 0.61–1.24)
Creatinine, Ser: 1.03 mg/dL (ref 0.61–1.24)
GFR, Estimated: >60 mL/min (ref >60)
GFR, Estimated: >60 mL/min (ref >60)
Glucose, Bld: 152 mg/dL — ABNORMAL HIGH (ref 70–99)
Glucose, Bld: 152 mg/dL — ABNORMAL HIGH (ref 70–99)
Potassium: 3.8 mmol/L (ref 3.5–5.1)
Potassium: 4.1 mmol/L (ref 3.5–5.1)
Sodium: 144 mmol/L (ref 135–145)
Sodium: 144 mmol/L (ref 135–145)

## 2021-06-06 LAB — TROPONIN I (HIGH SENSITIVITY)
Troponin I (High Sensitivity): 226 ng/L (ref <18)
Troponin I (High Sensitivity): 196 ng/L (ref <18)

## 2021-06-06 LAB — TYPE AND SCREEN
ABO/RH(D): O POS
Antibody Screen: NEGATIVE
Unit division: 0
Unit division: 0
Unit division: 0
Unit division: 0
Unit division: 0

## 2021-06-06 LAB — BPAM RBC
Blood Product Expiration Date: 202303132359
Blood Product Expiration Date: 202303132359
Blood Product Expiration Date: 202303132359
Blood Product Expiration Date: 202303132359
Blood Product Expiration Date: 202303182359
ISSUE DATE / TIME: 202302140809
ISSUE DATE / TIME: 202302160829
Unit Type and Rh: 5100
Unit Type and Rh: 5100
Unit Type and Rh: 5100
Unit Type and Rh: 5100
Unit Type and Rh: 5100

## 2021-06-06 LAB — CULTURE, BAL-QUANTITATIVE W GRAM STAIN: Culture: >=100000 — AB

## 2021-06-06 LAB — LACTATE DEHYDROGENASE: LDH: 244 U/L — ABNORMAL HIGH (ref 98–192)

## 2021-06-06 LAB — PROTIME-INR
INR: 2.1 — ABNORMAL HIGH (ref 0.8–1.2)
Prothrombin Time: 23.2 seconds — ABNORMAL HIGH (ref 11.4–15.2)

## 2021-06-06 LAB — HEPATIC FUNCTION PANEL
ALT: 76 U/L — ABNORMAL HIGH (ref 0–44)
AST: 30 U/L (ref 15–41)
Albumin: 2.8 g/dL — ABNORMAL LOW (ref 3.5–5.0)
Alkaline Phosphatase: 90 U/L (ref 38–126)
Bilirubin, Direct: 0.2 mg/dL (ref 0.0–0.2)
Indirect Bilirubin: 0.4 mg/dL (ref 0.3–0.9)
Total Bilirubin: 0.6 mg/dL (ref 0.3–1.2)
Total Protein: 4.9 g/dL — ABNORMAL LOW (ref 6.5–8.1)

## 2021-06-06 LAB — FIBRINOGEN: Fibrinogen: 422 mg/dL (ref 210–475)

## 2021-06-06 LAB — APTT
aPTT: 66 seconds — ABNORMAL HIGH (ref 24–36)
aPTT: 67 seconds — ABNORMAL HIGH (ref 24–36)

## 2021-06-06 LAB — LACTIC ACID, PLASMA: Lactic Acid, Venous: 1.6 mmol/L (ref 0.5–1.9)

## 2021-06-06 MED ORDER — HYDRALAZINE HCL 20 MG/ML IJ SOLN
10.0000 mg | Freq: Four times a day (QID) | INTRAMUSCULAR | Status: DC | PRN
  Administered 2021-06-06 – 2021-06-13 (×2): 10 mg via INTRAVENOUS
  Filled 2021-06-06 (×2): qty 1

## 2021-06-06 MED ORDER — POTASSIUM CHLORIDE 20 MEQ PO PACK
40.0000 meq | PACK | Freq: Once | ORAL | Status: AC
  Administered 2021-06-06: 40 meq
  Filled 2021-06-06: qty 2

## 2021-06-06 MED ORDER — VANCOMYCIN HCL 750 MG/150ML IV SOLN
750.0000 mg | Freq: Once | INTRAVENOUS | Status: AC
  Administered 2021-06-06: 750 mg via INTRAVENOUS
  Filled 2021-06-06: qty 150

## 2021-06-06 NOTE — Progress Notes (Signed)
NAME:  Randall Hodges, MRN:  [id number], DOB:  29-Nov-1950, LOS: 63 ADMISSION DATE:  04/28/2021, CONSULTATION DATE: 04/22/2021 REFERRING MD:  Dahlia Byes, MD, CHIEF COMPLAINT: Increasing shortness of breath  History of Present Illness:  71 year old male with coronary artery disease who initially presented with chest pain, noted to have multivessel coronary artery disease, he underwent CABG x3 on 04/1818 23, course was complicated with atrial fibrillation, frequent hiccups and aspiration pneumonia leading to hypoxia and increasing oxygen requirement. PCCM was consulted for evaluation and help with management  Patient stated hiccups are better but continued complain of shortness of breath, cough unable to bring up phlegm.  Pertinent  Medical History  CAD Allergic rhinitis Prostate cancer GERD Pulmonary fibrosis   Significant Hospital Events: Including procedures, antibiotic start and stop dates in addition to other pertinent events   1/18 CABG 1/30 PCCM consult, Worsening O2 needs, likely aspiration, new pressor need 2/2 intubation, placed on VV ECMO 2/5 issues with recirc, bronch neg, improved with supine positioning 2/7 percutaneous tracheostomy 2/10 diuresis and weaning sedation. CT chest shows diffuse ground-glass opacification.  2/11 weaning sedation 2/13 increasing air hunger as oxygenator efficiency decreasing.  2/14 oxygenator changed.  Still having air hunger but able to follow commands and participate with therapy.  2/15 considerable distress with double stacking. Switched to true rest settings - with improvement in distress.  2/16 bronch, NGT placed to LIWS, con't post-pyloric TF 2/17 DCCV overnight for Afib with RVR  Interim History / Subjective:  Afib with RVR and desaturations overnight. Increased fiO2. Required DCCV for Afib. Back on fentanyl.  Sat EOB with PT yesterday.  Objective   Blood pressure 133/70, pulse 84, temperature 98.2 F (36.8 C), temperature source  Axillary, resp. rate 15, height _0  (1.778 m), weight 92.1 kg, SpO2 (!) 88 %. CVP:  [2 mmHg-15 mmHg] 6 mmHg  Vent Mode: PCV FiO2 (%):  [40 %-100 %] 50 % Set Rate:  [10 bmp] 10 bmp PEEP:  [10 cmH20] 10 cmH20 Plateau Pressure:  [18 cmH20-20 cmH20] 20 cmH20   Intake/Output Summary (Last 24 hours) at 06/06/2021 0734 Last data filed at 06/06/2021 0700 Gross per 24 hour  Intake 3114.14 ml  Output 7375 ml  Net -4260.86 ml    Filed Weights   06/04/21 0411 06/05/21 0500 06/06/21 0500  Weight: 86.3 kg 88.6 kg 92.1 kg   Examination: General: critically ill appearing man lying in bed on MV, ECMO HENT: [place]/AT, eyes anicteric, oral mucosa moist Neck: RIJ crescent cannula, trach Lungs: Minimal breath sounds, thin bloody secretions from ETT.  Cardiovascular:  S1S2, RRR Abdomen: soft, ND, NT. Small amount of gastric secretions in NGT  Extremities: dependent edema persists, Aline with good distal perfusion L hand Neuro: RASS 0, CAM ICU + , not tracking to voice or following commands.  GU: foley with light yellow urine  vent 01/26/09/70% with Vt ~60cc ECMO 4.8L, 3500 RPM, Sweep 8L  7.41/41/55/38 BG 140-150s BUN 56 Cr 1.01 LDH 244 H/H 11/33.3 Platelets 143 PTT 66 INR 2.1 CXR personally reviewed> severe bilateral infiltrates, air bronchograms, clearing in RLL BAL culture> >100K CFU NF  Ancillary tests personally reviewed:     Assessment & Plan:   Acute hypoxic and hypercapnic respiratory failure with hypoxia ARDS Aspiration pneumonitis and pneumonia- normal flora Mild baseline subpleural fibrosis- question if this could be chronic aspiration related. Does not appear to be significantly progressed since CT in 2019. Most of CT abnormalities are acute airspace disease. -Con't VV ECMO, ultra-lung protective  ventilation. -VAP prevention protocol -PAD protocol -routine trach care -ABGs and labs per protocol -con't aggressive diuresis  Hiccups, recurrent aspiration episodes.  Probably chronic aspiration. -con't reglan -con't baclofen and gabapentin -con't NGT to LIWS and post-pyloric feeding -PPI BID -try to reduce steroids when able  Hypernatremia, resolved -FWF 200cc Q4h -monitor  CAD with UA s/p CABG x 3, (LIMA-LAD, SVG-OM, SVG-left PDA) NSVT Afib with RVR, DCCV -daily aspirin and statin -holding coreg due to bradycardic episodes -tele monitoring -con't amiodarone infusion   Hypertension -hydralazine PRN  Possible GPC bacteremia vs contaminant- staph epi -con't vanc -monitor cultures  Hyperglycemia -SSI PRN -goal BG 140-180  Thrombocytopenia -con't to monitor, no indication for transfusion  Coagulopathy-- elevated INR not explained by bival -monitor  Acute metabolic encephalopathy, ICU delirium -con't enteral meds- morphine, seroquel, klonopin -frequent reorientation, family visitation, mobility -fentanyl gtt PRN -adequate pain control  Deconditioning -PT, OT  Discussed plan with ECMO team.   Best Practice (right click and "Reselect all SmartList Selections" daily)   Diet/type: TF DVT prophylaxis: Bival CBG: SSI GI prophylaxis: PPI Lines: L [place] TLC, RIJ  Foley:  Yes Code Status:  full code Last date of multidisciplinary goals of care discussion [ 2/16]   This patient is critically ill with multiple organ system failure which requires frequent high complexity decision making, assessment, support, evaluation, and titration of therapies. This was completed through the application of advanced monitoring technologies and extensive interpretation of multiple databases. During this encounter critical care time was devoted to patient care services described in this note for 55 minutes.  Julian Hy, DO 06/06/21 8:13 AM [facility]

## 2021-06-06 NOTE — Progress Notes (Signed)
Patient ID: Randall Hodges, male   DOB: 12-16-1950, 71 y.o.   MRN: [id number]  Extracorporeal support note  ECLS support day: 15 Indication: ARDS  Configuration: VV  Drainage cannula: Crescent R IJ Return cannula: Crescent R IJ  Pump speed: 3500 rpm Pump flow: 4.8 L/min Pump used: Cardiohelp  Sweep gas: 6    Circuit check: Good color change. LDH stable 244.  Anticoagulant: Bivalirudin, PTT goal 50-80. PTT 66  Changes in support: No changes today.   Anticipated goals/duration of support: Wean to discontinuation  Loralie Champagne, MD  7:40 AM

## 2021-06-06 NOTE — Progress Notes (Incomplete)
Issues with chugging, cvp read 5. Flows dropped to 0.5. One 250 albumin given and lasix infusion dropped  from 8mg  to 6mg  per hour.

## 2021-06-06 NOTE — Progress Notes (Signed)
ANTICOAGULATION CONSULT NOTE  Pharmacy Consult for Bivalirudin Indication:  ECMO  Allergies  Allergen Reactions   Amoxicillin-Pot Clavulanate     Per pt report on 08/19/20, makes his urine dark colored   Atorvastatin Other (See Comments)     ( pt states causing runny nose, headaches, issue w/ urination)   Plant Derived Enzymes     Other reaction(s): Unknown   Trichophyton Other (See Comments)    Patient Measurements: Height: 5\' 10"  (177.8 cm) Weight: 92.1 kg (203 lb 0.7 oz) IBW/kg (Calculated) : 73  Vital Signs: BP: 125/72 (02/18 1500) Pulse Rate: 86 (02/18 1500)  Labs: Recent Labs    06/04/21 0244 06/04/21 0311 06/05/21 0221 06/05/21 0514 06/05/21 1700 06/05/21 2053 06/05/21 2208 06/06/21 0037 06/06/21 0353 06/06/21 0424 06/06/21 0532 06/06/21 0730 06/06/21 1549 06/06/21 1656 06/06/21 1715  HGB 9.2*   < > 10.0*   < > 11.6*   < >  --   --  11.0*   < >  --    < > 10.2* 10.7* 11.2*  HCT 28.3*   < > 32.1*   < > 36.9*   < >  --   --  33.3*   < >  --    < > 30.0* 32.8* 33.0*  PLT 136*   < > 121*  --  175  --   --   --  143*  --   --   --   --  138*  --   APTT 61*   < > 64*  --  57*  --   --   --  66*  --   --   --   --  67*  --   LABPROT 23.9*  --  24.1*  --   --   --   --   --  23.2*  --   --   --   --   --   --   INR 2.1*  --  2.2*  --   --   --   --   --  2.1*  --   --   --   --   --   --   CREATININE 1.11   < > 1.01  --  1.04  --  1.04  --  1.01  --   --   --   --  1.03  --   TROPONINIHS  --   --   --   --   --   --  211* 226*  --   --  196*  --   --   --   --    < > = values in this interval not displayed.     Estimated Creatinine Clearance: 75 mL/min (by C-G formula based on SCr of 1.03 mg/dL).   Medical History: Past Medical History:  Diagnosis Date   Allergic rhinitis    Prostate cancer ([place])     Medications:  Infusions:   sodium chloride Stopped (06/01/21 1121)   sodium chloride 10 mL/hr at 06/06/21 1700   sodium chloride 10 mL/hr at 06/06/21  1700   albumin human Stopped (06/06/21 0300)   albumin human Stopped (06/05/21 1227)   amiodarone 30 mg/hr (06/06/21 1700)   bivalirudin (ANGIOMAX) infusion 0.5 mg/mL (Non-ACS indications) 0.065 mg/kg/hr (06/06/21 1701)   dexmedetomidine (PRECEDEX) IV infusion Stopped (06/05/21 0655)   feeding supplement (VITAL 1.5 CAL) 1,000 mL (06/06/21 1100)   fentaNYL infusion INTRAVENOUS 75 mcg/hr (06/06/21 1700)  furosemide (LASIX) 200 mg in dextrose 5% 100 mL (2mg /mL) infusion 8 mg/hr (06/06/21 1700)   vancomycin 1,000 mg (06/06/21 7591)    Assessment: 71 year old male with coronary artery disease who initially presented with chest pain, noted to have multivessel coronary artery disease. He underwent CABG x3 on 04/23/2021; course was complicated with atrial fibrillation, frequent hiccups and aspiration pneumonia leading to hypoxia and increasing oxygen requirement. Patient now requiring VV ECMO. Pharmacy consulted for bivalirudin IV.  Aptt at goal (67) Pltc stable 138, hgb stable 11.2   Goal of Therapy:  aPTT 50-80 seconds Monitor platelets by anticoagulation protocol: Yes   Plan:  Continue bivalirudin at 0.065 mg/kg/hr Monitor q12h aptt and CBC Monitor closely for s/sx bleeding/thrombus  Hildred Laser, PharmD Clinical Pharmacist **Pharmacist phone directory can now be found on [email or web address] ([email or web address]).  Listed under [place].

## 2021-06-06 NOTE — Progress Notes (Signed)
ANTICOAGULATION CONSULT NOTE  Pharmacy Consult for Bivalirudin Indication:  ECMO  Allergies  Allergen Reactions   Amoxicillin-Pot Clavulanate     Per pt report on 08/19/20, makes his urine dark colored   Atorvastatin Other (See Comments)     ( pt states causing runny nose, headaches, issue w/ urination)   Plant Derived Enzymes     Other reaction(s): Unknown   Trichophyton Other (See Comments)    Patient Measurements: Height: 5\' 10"  (177.8 cm) Weight: 92.1 kg (203 lb 0.7 oz) IBW/kg (Calculated) : 73  Vital Signs: Temp: 98.2 F (36.8 C) (02/18 0430) Temp Source: Axillary (02/18 0430) BP: 157/77 (02/18 0630) Pulse Rate: 83 (02/18 0630)  Labs: Recent Labs    06/04/21 0244 06/04/21 0311 06/05/21 0221 06/05/21 0514 06/05/21 1700 06/05/21 2053 06/05/21 2208 06/06/21 0037 06/06/21 0353 06/06/21 0424 06/06/21 0532  HGB 9.2*   < > 10.0*   < > 11.6* 11.2*  --   --  11.0* 10.9*  --   HCT 28.3*   < > 32.1*   < > 36.9* 33.0*  --   --  33.3* 32.0*  --   PLT 136*   < > 121*  --  175  --   --   --  143*  --   --   APTT 61*   < > 64*  --  57*  --   --   --  66*  --   --   LABPROT 23.9*  --  24.1*  --   --   --   --   --  23.2*  --   --   INR 2.1*  --  2.2*  --   --   --   --   --  2.1*  --   --   CREATININE 1.11   < > 1.01  --  1.04  --  1.04  --  1.01  --   --   TROPONINIHS  --   --   --   --   --   --  211* 226*  --   --  196*   < > = values in this interval not displayed.     Estimated Creatinine Clearance: 76.5 mL/min (by C-G formula based on SCr of 1.01 mg/dL).   Medical History: Past Medical History:  Diagnosis Date   Allergic rhinitis    Prostate cancer ([place])     Medications:  Infusions:   sodium chloride Stopped (06/01/21 1121)   sodium chloride Stopped (06/06/21 0303)   sodium chloride 10 mL/hr at 06/06/21 0600   albumin human 12.5 g (06/06/21 3614)   albumin human Stopped (06/05/21 1227)   amiodarone 30 mg/hr (06/06/21 0600)   bivalirudin (ANGIOMAX)  infusion 0.5 mg/mL (Non-ACS indications) 0.065 mg/kg/hr (06/06/21 0600)   dexmedetomidine (PRECEDEX) IV infusion Stopped (06/05/21 0655)   feeding supplement (VITAL 1.5 CAL) 1,000 mL (06/05/21 1356)   fentaNYL infusion INTRAVENOUS 50 mcg/hr (06/06/21 0600)   furosemide (LASIX) 200 mg in dextrose 5% 100 mL (2mg /mL) infusion 8 mg/hr (06/06/21 0600)   vancomycin 1,750 mg (06/05/21 0831)    Assessment: 71 year old male with coronary artery disease who initially presented with chest pain, noted to have multivessel coronary artery disease. He underwent CABG x3 on 04/28/2021; course was complicated with atrial fibrillation, frequent hiccups and aspiration pneumonia leading to hypoxia and increasing oxygen requirement. Patient now requiring VV ECMO. Pharmacy consulted for bivalirudin IV.  Aptt at goal (66s) Pltc stable 143, hgb stable  10.9 LDH stable in 200s Fibrinogen up to 422  Spoke with nurse, no signs of bleeding at this time.   Oxygenator and trach changed out 2/14.   Of note, the evening of 2/17 the patient developed a flutter/VT and underwent DCCV with return to NSR after 1 shock and is now on an amiodarone drip.   Goal of Therapy:  aPTT 50-80 seconds Monitor platelets by anticoagulation protocol: Yes   Plan:  Continue bivalirudin at 0.065 mg/kg/hr Monitor q12h aptt and CBC Monitor closely for s/sx bleeding/thrombus  Cathrine Muster, PharmD PGY2 Cardiology Pharmacy Resident Phone: [phone] 06/06/2021  7:08 AM  Please check [email or web address] for unit-specific pharmacy phone numbers.

## 2021-06-06 NOTE — Progress Notes (Signed)
Patient ID: Randall Hodges, male   DOB: [birth year], 71 y.o.   MRN: [id number]    Progress Note  Patient Name: Randall Hodges Date of Encounter: 06/06/2021  [facility] Cardiologist: [name], MD   Subjective   2/2: VV ECMO initiation, [place] catheter right IJ 2/7: Tracheostomy 2/13 CT C/A/P: Diffuse ground-glass opacities consistent with atelectasis and edema presumably related to ECMO physiology and respiratory failure.  Underlying moderate interstitial lung disease mildly progressed from prior CT. 2/15: Oxygenator changed, trach changed.  2/16: Transient asystole with cough and adjustment of tracheostomy overnight.  2/17: Atrial flutter with RVR, required emergent DCCV and amiodarone restarted  CXR with severe bilateral diffuse disease, unchanged.   Remains on vancomycin.  Suspect aspiration again with TFs in trach on 2/16, NGT placed to suction and also has post-pyloric tube for feeds.    Lung protective ventilation with ARDS.  Vent FiO2 0.7. Vt 60 cc.  CVP 13 this morning, on Lasix gtt with excellent diuresis (net negative 4L).  Creatinine and BUN stable.  No chugging this morning, had some overnight and got albumin.   He remains in NSR on amiodarone gtt.   ECMO: Speed 3500 rpm Flow 4.8 L/min pVen -94 DeltaP 28 Sweep 6 PTT 66 (goal 50-80) LDH 244 ABG 7.44/41/55/89% Lactate 1.6 Pre- and post-oxygenator ABGs were appropriate today.   Cardiac Studies: Echo (limited, 1/30): Echo reviewed, there is clear respirophasic variation of the interventricular septum and marked respirophasic variation of E inflow velocity on doppler evaluation of the mitral valve.  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  LV EF 45-50%.   RHC Procedural Findings (on norepinephrine 6): Hemodynamics  (mmHg) RA mean 12 RV 37/12 PA 38/16, mean 27 PCWP mean 11 LV 108/12 AO 96/55 PAPI 1.8 Oxygen saturations: PA 54% AO 94% Cardiac Output (Fick) 5.59  Cardiac Index (Fick) 2.81  PVR 2.8 WU Simultaneous RV/LV tracings were obtained.  Difficult to interpret due to atrial fibrillation.  There was some suggestion of discordance (ventricular interdependence) but not clear.  Inpatient Medications    Scheduled Meds:  sodium chloride   Intravenous Once   aspirin  81 mg Per Tube Daily   baclofen  5 mg Per Tube TID   chlorhexidine gluconate (MEDLINE KIT)  15 mL Mouth Rinse BID   Chlorhexidine Gluconate Cloth  6 each Topical Daily   clonazePAM  1 mg Per Tube BID   colchicine  0.6 mg Per Tube Daily   feeding supplement (PROSource TF)  90 mL Per Tube BID   fiber  1 packet Per Tube TID   free water  200 mL Per Tube Q2H   gabapentin  300 mg Per Tube QHS   Gerhardt's butt cream   Topical TID   guaiFENesin  30 mL Per Tube QID   insulin aspart  0-15 Units Subcutaneous Q4H   mouth rinse  15 mL Mouth Rinse 10 times per day   melatonin  5 mg Per Tube QHS   methylPREDNISolone (SOLU-MEDROL) injection  20 mg Intravenous Q24H   metoCLOPramide (REGLAN) injection  5 mg Intravenous Q8H   morphine  30 mg Per Tube Q6H   pantoprazole sodium  40 mg Per Tube BID   potassium chloride  40 mEq Per Tube Once   QUEtiapine  25 mg Per Tube QHS   rosuvastatin  10 mg Per Tube Daily  sodium chloride flush  10-40 mL Intracatheter Q12H   sodium chloride flush  10-40 mL Intracatheter Q12H   sodium chloride flush  3 mL Intravenous Q12H   Continuous Infusions:  sodium chloride Stopped (06/01/21 1121)   sodium chloride Stopped (06/06/21 0303)   sodium chloride 10 mL/hr at 06/06/21 0700   albumin human Stopped (06/06/21 4782)   albumin human Stopped (06/05/21 1227)   amiodarone 30 mg/hr (06/06/21 0700)   bivalirudin (ANGIOMAX) infusion 0.5 mg/mL (Non-ACS indications) 0.065 mg/kg/hr (06/06/21 0700)    dexmedetomidine (PRECEDEX) IV infusion Stopped (06/05/21 0655)   feeding supplement (VITAL 1.5 CAL) 1,000 mL (06/05/21 1356)   fentaNYL infusion INTRAVENOUS 50 mcg/hr (06/06/21 0700)   furosemide (LASIX) 200 mg in dextrose 5% 100 mL (44m/mL) infusion 8 mg/hr (06/06/21 0700)   vancomycin 1,750 mg (06/05/21 0831)   PRN Meds: Place/Maintain arterial line **AND** sodium chloride, sodium chloride, sodium chloride, acetaminophen (TYLENOL) oral liquid 160 mg/5 mL, albumin human, albumin human, docusate, fentaNYL, fentaNYL (SUBLIMAZE) injection, fentaNYL (SUBLIMAZE) injection, hydrALAZINE, levalbuterol, lip balm, ondansetron (ZOFRAN) IV, polyvinyl alcohol, sennosides, sodium chloride flush, white petrolatum   Vital Signs    Vitals:   06/06/21 0700 06/06/21 0706 06/06/21 0715 06/06/21 0730  BP: 133/70     Pulse: 83  85 84  Resp: (!) 8 10 (!) 26 15  Temp:      TempSrc:      SpO2: 91% 91% (!) 88% (!) 88%  Weight:      Height:        Intake/Output Summary (Last 24 hours) at 06/06/2021 0741 Last data filed at 06/06/2021 0700 Gross per 24 hour  Intake 3114.14 ml  Output 7375 ml  Net -4260.86 ml   Last 3 Weights 06/06/2021 06/05/2021 06/04/2021  Weight (lbs) 203 lb 0.7 oz 195 lb 5.2 oz 190 lb 4.1 oz  Weight (kg) 92.1 kg 88.6 kg 86.3 kg      Telemetry    NSR 70s (personally reviewed)  Physical Exam   General: Sedated on vent.  Neck: JVP 12-14, no thyromegaly or thyroid nodule.  Lungs: Distant. CV: Nondisplaced PMI.  Heart regular S1/S2, no S3/S4, no murmur.  1+ edema to thighs Abdomen: Soft, nontender, no hepatosplenomegaly, no distention.  Skin: Intact without lesions or rashes.  Neurologic: Will wake up and follow commands.  Extremities: No clubbing or cyanosis.  HEENT: Normal.    Labs    High Sensitivity Troponin:   Recent Labs  Lab 06/05/21 2208 06/06/21 0037 06/06/21 0532  TROPONINIHS 211* 226* 196*      Chemistry Recent Labs  Lab 06/01/21 0350 06/01/21 0353  06/02/21 0345 06/02/21 0539 06/04/21 0244 06/04/21 0311 06/05/21 0221 06/05/21 0514 06/05/21 1700 06/05/21 2053 06/05/21 2208 06/06/21 0353 06/06/21 0424  NA 150*   < > 151*   < > 145   < > 143   < > 143   < > 144 144 144  K 4.6   < > 4.3   < > 4.2   < > 4.4   < > 3.5   < > 3.4* 3.8 3.8  CL 113*   < > 115*   < > 112*   < > 112*  --  109  --  110 107  --   CO2 30   < > 30   < > 27   < > 26  --  24  --  25 27  --   GLUCOSE 168*   < > 148*   < >  166*   < > 162*  --  160*  --  175* 152*  --   BUN 87*   < > 85*   < > 71*   < > 62*  --  62*  --  58* 56*  --   CREATININE 1.29*   < > 1.25*   < > 1.11   < > 1.01  --  1.04  --  1.04 1.01  --   CALCIUM 8.0*   < > 7.9*   < > 7.7*   < > 7.8*  --  7.9*  --  7.8* 8.0*  --   MG 2.4  --  2.4  --   --   --   --   --   --   --  2.0  --   --   PROT 4.2*  --  4.2*   < > 4.0*  --  4.3*  --   --   --   --  4.9*  --   ALBUMIN 2.4*  --  2.6*   < > 2.3*  --  2.4*  --   --   --   --  2.8*  --   AST 233*  --  98*   < > 61*  --  40  --   --   --   --  30  --   ALT 179*  --  161*   < > 123*  --  101*  --   --   --   --  76*  --   ALKPHOS 177*  --  137*   < > 101  --  95  --   --   --   --  90  --   BILITOT 1.6*  --  1.0   < > 0.8  --  0.8  --   --   --   --  0.6  --   GFRNONAA 59*   < > >60   < > >60   < > >60  --  >60  --  >60 >60  --   ANIONGAP 7   < > 6   < > 6   < > 5  --  10  --  9 10  --    < > = values in this interval not displayed.    Lipids  No results for input(s): CHOL, TRIG, HDL, LABVLDL, LDLCALC, CHOLHDL in the last 168 hours.   Hematology Recent Labs  Lab 06/05/21 0221 06/05/21 0514 06/05/21 1700 06/05/21 2053 06/06/21 0353 06/06/21 0424  WBC 10.3  --  14.6*  --  10.4  --   RBC 3.39*  --  3.93*  --  3.65*  --   HGB 10.0*   < > 11.6* 11.2* 11.0* 10.9*  HCT 32.1*   < > 36.9* 33.0* 33.3* 32.0*  MCV 94.7  --  93.9  --  91.2  --   MCH 29.5  --  29.5  --  30.1  --   MCHC 31.2  --  31.4  --  33.0  --   RDW 17.3*  --  17.5*  --  17.4*  --    PLT 121*  --  175  --  143*  --    < > = values in this interval not displayed.   Thyroid No results for input(s): TSH, FREET4 in the last 168 hours.  BNPNo results for input(s): BNP, PROBNP in the  last 168 hours.  DDimer No results for input(s): DDIMER in the last 168 hours.   Radiology    DG Abd 1 View  Result Date: 06/04/2021 CLINICAL DATA:  Feeding tube placement. EXAM: ABDOMEN - 1 VIEW COMPARISON:  May 18, 2021. FINDINGS: Distal tip of feeding tube is seen in the expected position of the ligament of Treitz. ECMO device is again noted. No abnormal bowel dilatation. IMPRESSION: Distal tip of feeding tube seen in expected position of the ligament of Treitz. Electronically Signed   By: Marijo Conception M.D.   On: 06/04/2021 08:03   DG CHEST PORT 1 VIEW  Result Date: 06/05/2021 CLINICAL DATA:  ECMO.  Tracheostomy. EXAM: PORTABLE CHEST 1 VIEW COMPARISON:  June 04, 2021. FINDINGS: Stable cardiomediastinal silhouette. Tracheostomy tube is unchanged. ECMO device is unchanged. Stable diffuse opacification of both lungs is noted concerning for edema, pneumonia or ARDS. Stable probable bilateral pleural effusions. Bony thorax is unremarkable. IMPRESSION: Stable support apparatus. Stable bilateral lung opacities and effusions as described above. Electronically Signed   By: Marijo Conception M.D.   On: 06/05/2021 07:47   DG Abd Portable 1V  Result Date: 06/04/2021 CLINICAL DATA:  Encounter for NG tube placement EXAM: PORTABLE ABDOMEN - 1 VIEW COMPARISON:  Radiograph performed earlier on the same date. FINDINGS: Weighted tip feeding tube with distal tip projecting over the proximal jejunum. Interval placement of NG tube with distal tip and side port projecting over the stomach. ECMO device is unchanged. Gaseous distention of colonic loops. Nonobstructive bowel gas pattern. Diffuse lung opacities in the lung bases, unchanged. IMPRESSION: Interval placement of NG tube with distal tip and side port  projecting over the body of the stomach. No other significant interval change. Electronically Signed   By: Keane Police D.O.   On: 06/04/2021 15:22    Cardiac Studies   Cath 05/02/2021 Distal left main [place] 111 bifurcation stenosis with 75% left main, 90% ostial to proximal LAD, and 80-90% ostial circumflex (difficult to assess due to heavy calcification). Severe mid circumflex disease with 70% eccentric mid stenosis and second obtuse marginal containing ostial to proximal greater than 80% stenosis.  (Bifurcation [place] 111 Severe calcification in left main and LAD in particular with diffuse 50% narrowing from proximal to mid vessel and tandem 70% stenoses in the mid LAD. Nondominant right coronary Normal LV function.  EF 55%.  LVEDP normal.    Patient Profile     71 y.o. male with PMH of PVCs presented with chest pain. Cardiac cath by Dr. Tamala Julian on 04/30/2021 showed 75% left main, 90% ost to prox LAD, 80-90% ost LCx, 70% mid LCx, 80% OM2, 50% prox to mid LAD, 70% mid LAD, EF 55%. Patient underwent CABG x 3 on 05/08/2021. Post op course complicated afib, treated with amio. CXR showed opacity in bilateral lung, started abx on 1/25 and diuretic. Started on Eliquis due to recurrence of afib.    Assessment & Plan    1. CAD: Admitted with unstable angina, cath with severe left main and proximal LAD/LCx disease (nondominant RCA).  CABG x 3 on 1/18 with LIMA-LAD, SVG-OM, SVG-left PDA.  No s/s angina  - Continue ASA 81, statin.  2. 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 RHC 1/30 with equalization of diastolic  pressures.  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  Repeated echo 2/2 still showed respirophasic septal variation but not as impressive. CVP 13 today.  Lungs still with diffuse bilateral infiltrates on CXR.  Diuresed well with IV Lasix gtt yesterday, still with volume overload.  - Lasix gtt 8 mg/hr, can decrease with excessive chugging.  - With concern for development of post-surgical effusive/constrictive pericarditis, he is on colchicine.  - ?need for surgical intervention on effusive/constrictive pericarditis. Will need improvement of lung disease first then reassess, repeat echo not as impressive.   3. 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  Suspect primarily septic/vasodilatory shock. He is now off pressors.  4. Hiccups: Intractable initially, now improved.  - On baclofen and gabapentin.  5. PNA with acute hypoxic respiratory failure: Of note, he does appear to have had some pre-existing ILD from 2019 CT chest (?sarcoidosis). CXR with persistent bilateral infiltrates, possible mild improvement compared to yesterday.  Have thought most likely aspiration PNA/pneumonitis in setting of intractable hiccups. He has developed ARDS. COVID was negative.  FiO2 0.7 on vent.  We have struggled to wean sweep, now back to 6. CXR with bilateral diffuse airspace disease, unchanged. Has tracheostomy. CT chest 2/9 with persistent lung infiltrates.  Completed vancomycin and meropenem => MRSE 1/2 blood and trach aspirate, now back on vancomycin.  Suspect ongoing aspiration with TFs noted in trach on 2/16, now with NGT to suction and post-pyloric feeding tube.  LDH stable today. Pre- and post-oxygenator ABGs appropriate today. Stable ECMO circuit.  Lung protective ventilation.  - Vent and sedation per CCM.  - Solumedrol 20 mg IV daily with gradual taper, ESR now normal.  - Back on amiodarone but suspect not amiodarone lung toxicity.   6.  Atypical atrial flutter/PVCs:  DCCV 2/17.  Remains in NSR, now back on amiodarone gtt.  - Continue amiodarone gtt.  - Bivalirudin gtt, goal PTT 50-80.  7. AKI: Creatinine stable 1.01.  Follow closely.  8. Elevated LFTs: Suspect shock liver, follow CMET.  LFTs trending down.  9. Anemia: Transfuse hgb < 8.     10. Hypernatremia: Na 144 today.  - Continue free water 200 q2.  11. HTN: BP rises with sedation wean.  - prn hydralazine for now.  12. Bradycardia/asystole: Vagally mediated with cough.  Resolves rapidly with resolution of cough.  - Atropine at bedside.  13. FEN: TFs restarted with post-pyloric tube and also NGT to suction to prevent aspiration.   - Continue Reglan.   14. Mobilize as much as possible.  Wean sedation.   CRITICAL CARE Performed by: Loralie Champagne  Total critical care time: 45 minutes  Critical care time was exclusive of separately billable procedures and treating other patients.  Critical care was necessary to treat or prevent imminent or life-threatening deterioration.  Critical care was time spent personally by me on the following activities: development of treatment plan with patient and/or surrogate as well as nursing, discussions with consultants, evaluation of patient's response to treatment, examination of patient, obtaining history from patient or surrogate, ordering and performing treatments and interventions, ordering and review of laboratory studies, ordering and review of radiographic studies, pulse oximetry and re-evaluation of patient's condition.  Loralie Champagne MD 06/06/2021 7:41 AM

## 2021-06-06 NOTE — Progress Notes (Signed)
Changed vent to infant mode D/T vent alarming low MV and MV at expected per MD request. No changes to vent settings at this time.

## 2021-06-07 ENCOUNTER — Inpatient Hospital Stay (HOSPITAL_COMMUNITY): Payer: PPO

## 2021-06-07 DIAGNOSIS — J9601 Acute respiratory failure with hypoxia: Secondary | ICD-10-CM | POA: Diagnosis not present

## 2021-06-07 DIAGNOSIS — I249 Acute ischemic heart disease, unspecified: Secondary | ICD-10-CM | POA: Diagnosis not present

## 2021-06-07 DIAGNOSIS — Z9911 Dependence on respirator [ventilator] status: Secondary | ICD-10-CM | POA: Diagnosis not present

## 2021-06-07 DIAGNOSIS — Z9281 Personal history of extracorporeal membrane oxygenation (ECMO): Secondary | ICD-10-CM | POA: Diagnosis not present

## 2021-06-07 LAB — BASIC METABOLIC PANEL
Anion gap: 9 (ref 5–15)
Anion gap: 9 (ref 5–15)
BUN: 55 mg/dL — ABNORMAL HIGH (ref 8–23)
BUN: 55 mg/dL — ABNORMAL HIGH (ref 8–23)
CO2: 29 mmol/L (ref 22–32)
CO2: 30 mmol/L (ref 22–32)
Calcium: 7.7 mg/dL — ABNORMAL LOW (ref 8.9–10.3)
Calcium: 7.9 mg/dL — ABNORMAL LOW (ref 8.9–10.3)
Chloride: 105 mmol/L (ref 98–111)
Chloride: 104 mmol/L (ref 98–111)
Creatinine, Ser: 1.11 mg/dL (ref 0.61–1.24)
Creatinine, Ser: 1.02 mg/dL (ref 0.61–1.24)
GFR, Estimated: >60 mL/min (ref >60)
GFR, Estimated: >60 mL/min (ref >60)
Glucose, Bld: 155 mg/dL — ABNORMAL HIGH (ref 70–99)
Glucose, Bld: 142 mg/dL — ABNORMAL HIGH (ref 70–99)
Potassium: 4 mmol/L (ref 3.5–5.1)
Potassium: 3.6 mmol/L (ref 3.5–5.1)
Sodium: 143 mmol/L (ref 135–145)
Sodium: 143 mmol/L (ref 135–145)

## 2021-06-07 LAB — CBC
HCT: 30.3 % — ABNORMAL LOW (ref 39.0–52.0)
HCT: 30.2 % — ABNORMAL LOW (ref 39.0–52.0)
Hemoglobin: 9.9 g/dL — ABNORMAL LOW (ref 13.0–17.0)
Hemoglobin: 9.8 g/dL — ABNORMAL LOW (ref 13.0–17.0)
MCH: 29.7 pg (ref 26.0–34.0)
MCH: 30.4 pg (ref 26.0–34.0)
MCHC: 32.7 g/dL (ref 30.0–36.0)
MCHC: 32.5 g/dL (ref 30.0–36.0)
MCV: 91 fL (ref 80.0–100.0)
MCV: 93.8 fL (ref 80.0–100.0)
Platelets: 133 10*3/uL — ABNORMAL LOW (ref 150–400)
Platelets: UNDETERMINED 10*3/uL (ref 150–400)
RBC: 3.33 MIL/uL — ABNORMAL LOW (ref 4.22–5.81)
RBC: 3.22 MIL/uL — ABNORMAL LOW (ref 4.22–5.81)
RDW: 17.5 % — ABNORMAL HIGH (ref 11.5–15.5)
RDW: 17.5 % — ABNORMAL HIGH (ref 11.5–15.5)
WBC: 9.5 10*3/uL (ref 4.0–10.5)
WBC: 8.5 10*3/uL (ref 4.0–10.5)
nRBC: 0 % (ref 0.0–0.2)
nRBC: 0 % (ref 0.0–0.2)

## 2021-06-07 LAB — POCT I-STAT 7, (LYTES, BLD GAS, ICA,H+H)
Acid-Base Excess: 6 mmol/L — ABNORMAL HIGH (ref 0.0–2.0)
Acid-Base Excess: 7 mmol/L — ABNORMAL HIGH (ref 0.0–2.0)
Acid-Base Excess: 7 mmol/L — ABNORMAL HIGH (ref 0.0–2.0)
Acid-Base Excess: 6 mmol/L — ABNORMAL HIGH (ref 0.0–2.0)
Acid-Base Excess: 7 mmol/L — ABNORMAL HIGH (ref 0.0–2.0)
Acid-Base Excess: 6 mmol/L — ABNORMAL HIGH (ref 0.0–2.0)
Acid-Base Excess: 6 mmol/L — ABNORMAL HIGH (ref 0.0–2.0)
Bicarbonate: 31.1 mmol/L — ABNORMAL HIGH (ref 20.0–28.0)
Bicarbonate: 31.6 mmol/L — ABNORMAL HIGH (ref 20.0–28.0)
Bicarbonate: 30.8 mmol/L — ABNORMAL HIGH (ref 20.0–28.0)
Bicarbonate: 31.7 mmol/L — ABNORMAL HIGH (ref 20.0–28.0)
Bicarbonate: 32 mmol/L — ABNORMAL HIGH (ref 20.0–28.0)
Bicarbonate: 31.2 mmol/L — ABNORMAL HIGH (ref 20.0–28.0)
Bicarbonate: 31.7 mmol/L — ABNORMAL HIGH (ref 20.0–28.0)
Calcium, Ion: 1.15 mmol/L (ref 1.15–1.40)
Calcium, Ion: 1.21 mmol/L (ref 1.15–1.40)
Calcium, Ion: 1.23 mmol/L (ref 1.15–1.40)
Calcium, Ion: 1.19 mmol/L (ref 1.15–1.40)
Calcium, Ion: 1.2 mmol/L (ref 1.15–1.40)
Calcium, Ion: 1.18 mmol/L (ref 1.15–1.40)
Calcium, Ion: 1.17 mmol/L (ref 1.15–1.40)
HCT: 29 % — ABNORMAL LOW (ref 39.0–52.0)
HCT: 31 % — ABNORMAL LOW (ref 39.0–52.0)
HCT: 31 % — ABNORMAL LOW (ref 39.0–52.0)
HCT: 32 % — ABNORMAL LOW (ref 39.0–52.0)
HCT: 29 % — ABNORMAL LOW (ref 39.0–52.0)
HCT: 29 % — ABNORMAL LOW (ref 39.0–52.0)
HCT: 26 % — ABNORMAL LOW (ref 39.0–52.0)
Hemoglobin: 9.9 g/dL — ABNORMAL LOW (ref 13.0–17.0)
Hemoglobin: 10.5 g/dL — ABNORMAL LOW (ref 13.0–17.0)
Hemoglobin: 10.5 g/dL — ABNORMAL LOW (ref 13.0–17.0)
Hemoglobin: 10.9 g/dL — ABNORMAL LOW (ref 13.0–17.0)
Hemoglobin: 9.9 g/dL — ABNORMAL LOW (ref 13.0–17.0)
Hemoglobin: 9.9 g/dL — ABNORMAL LOW (ref 13.0–17.0)
Hemoglobin: 8.8 g/dL — ABNORMAL LOW (ref 13.0–17.0)
O2 Saturation: 89 %
O2 Saturation: 90 %
O2 Saturation: 90 %
O2 Saturation: 88 %
O2 Saturation: 93 %
O2 Saturation: 83 %
O2 Saturation: 90 %
Patient temperature: 98.7
Patient temperature: 36.5
Patient temperature: 36.7
Patient temperature: 36.6
Patient temperature: 36.9
Patient temperature: 98.4
Patient temperature: 98.4
Potassium: 4 mmol/L (ref 3.5–5.1)
Potassium: 3.7 mmol/L (ref 3.5–5.1)
Potassium: 3.8 mmol/L (ref 3.5–5.1)
Potassium: 3.7 mmol/L (ref 3.5–5.1)
Potassium: 3.7 mmol/L (ref 3.5–5.1)
Potassium: 5.3 mmol/L — ABNORMAL HIGH (ref 3.5–5.1)
Potassium: 4.5 mmol/L (ref 3.5–5.1)
Sodium: 142 mmol/L (ref 135–145)
Sodium: 141 mmol/L (ref 135–145)
Sodium: 142 mmol/L (ref 135–145)
Sodium: 144 mmol/L (ref 135–145)
Sodium: 145 mmol/L (ref 135–145)
Sodium: 144 mmol/L (ref 135–145)
Sodium: 144 mmol/L (ref 135–145)
TCO2: 32 mmol/L (ref 22–32)
TCO2: 33 mmol/L — ABNORMAL HIGH (ref 22–32)
TCO2: 32 mmol/L (ref 22–32)
TCO2: 33 mmol/L — ABNORMAL HIGH (ref 22–32)
TCO2: 33 mmol/L — ABNORMAL HIGH (ref 22–32)
TCO2: 33 mmol/L — ABNORMAL HIGH (ref 22–32)
TCO2: 33 mmol/L — ABNORMAL HIGH (ref 22–32)
pCO2 arterial: 45.3 mmHg (ref 32–48)
pCO2 arterial: 44.1 mmHg (ref 32–48)
pCO2 arterial: 40.3 mmHg (ref 32–48)
pCO2 arterial: 49.3 mmHg — ABNORMAL HIGH (ref 32–48)
pCO2 arterial: 46.4 mmHg (ref 32–48)
pCO2 arterial: 48.7 mmHg — ABNORMAL HIGH (ref 32–48)
pCO2 arterial: 53.9 mmHg — ABNORMAL HIGH (ref 32–48)
pH, Arterial: 7.445 (ref 7.35–7.45)
pH, Arterial: 7.461 — ABNORMAL HIGH (ref 7.35–7.45)
pH, Arterial: 7.49 — ABNORMAL HIGH (ref 7.35–7.45)
pH, Arterial: 7.414 (ref 7.35–7.45)
pH, Arterial: 7.447 (ref 7.35–7.45)
pH, Arterial: 7.415 (ref 7.35–7.45)
pH, Arterial: 7.377 (ref 7.35–7.45)
pO2, Arterial: 55 mmHg — ABNORMAL LOW (ref 83–108)
pO2, Arterial: 54 mmHg — ABNORMAL LOW (ref 83–108)
pO2, Arterial: 53 mmHg — ABNORMAL LOW (ref 83–108)
pO2, Arterial: 54 mmHg — ABNORMAL LOW (ref 83–108)
pO2, Arterial: 65 mmHg — ABNORMAL LOW (ref 83–108)
pO2, Arterial: 47 mmHg — ABNORMAL LOW (ref 83–108)
pO2, Arterial: 61 mmHg — ABNORMAL LOW (ref 83–108)

## 2021-06-07 LAB — GLUCOSE, CAPILLARY
Glucose-Capillary: 126 mg/dL — ABNORMAL HIGH (ref 70–99)
Glucose-Capillary: 136 mg/dL — ABNORMAL HIGH (ref 70–99)
Glucose-Capillary: 142 mg/dL — ABNORMAL HIGH (ref 70–99)
Glucose-Capillary: 143 mg/dL — ABNORMAL HIGH (ref 70–99)
Glucose-Capillary: 144 mg/dL — ABNORMAL HIGH (ref 70–99)
Glucose-Capillary: 148 mg/dL — ABNORMAL HIGH (ref 70–99)
Glucose-Capillary: 177 mg/dL — ABNORMAL HIGH (ref 70–99)
Glucose-Capillary: 84 mg/dL (ref 70–99)

## 2021-06-07 LAB — HEPATIC FUNCTION PANEL
ALT: 73 U/L — ABNORMAL HIGH (ref 0–44)
AST: 40 U/L (ref 15–41)
Albumin: 2.8 g/dL — ABNORMAL LOW (ref 3.5–5.0)
Alkaline Phosphatase: 89 U/L (ref 38–126)
Bilirubin, Direct: 0.2 mg/dL (ref 0.0–0.2)
Indirect Bilirubin: 0.5 mg/dL (ref 0.3–0.9)
Total Bilirubin: 0.7 mg/dL (ref 0.3–1.2)
Total Protein: 4.5 g/dL — ABNORMAL LOW (ref 6.5–8.1)

## 2021-06-07 LAB — FIBRINOGEN: Fibrinogen: 351 mg/dL (ref 210–475)

## 2021-06-07 LAB — CULTURE, BLOOD (ROUTINE X 2)
Culture: NO GROWTH
Special Requests: ADEQUATE

## 2021-06-07 LAB — PROTIME-INR
INR: 2.2 — ABNORMAL HIGH (ref 0.8–1.2)
Prothrombin Time: 24.3 seconds — ABNORMAL HIGH (ref 11.4–15.2)

## 2021-06-07 LAB — APTT
aPTT: 57 seconds — ABNORMAL HIGH (ref 24–36)
aPTT: 62 seconds — ABNORMAL HIGH (ref 24–36)

## 2021-06-07 LAB — LACTIC ACID, PLASMA: Lactic Acid, Venous: 1.3 mmol/L (ref 0.5–1.9)

## 2021-06-07 LAB — LACTATE DEHYDROGENASE: LDH: 225 U/L — ABNORMAL HIGH (ref 98–192)

## 2021-06-07 MED ORDER — POTASSIUM CHLORIDE 20 MEQ PO PACK: 40.0000 meq | PACK | Freq: Once | ORAL | Status: DC

## 2021-06-07 MED ORDER — AMIODARONE HCL 200 MG PO TABS
200.0000 mg | ORAL_TABLET | Freq: Two times a day (BID) | ORAL | Status: DC
  Administered 2021-06-07 (×2): 200 mg
  Filled 2021-06-07 (×2): qty 1

## 2021-06-07 MED ORDER — POTASSIUM CHLORIDE 20 MEQ PO PACK
40.0000 meq | PACK | Freq: Once | ORAL | Status: AC
  Administered 2021-06-07: 40 meq
  Filled 2021-06-07: qty 2

## 2021-06-07 MED ORDER — FREE WATER
200.0000 mL | Status: DC
  Administered 2021-06-07 – 2021-06-17 (×60): 200 mL

## 2021-06-07 NOTE — Progress Notes (Signed)
ANTICOAGULATION CONSULT NOTE  Pharmacy Consult for Bivalirudin Indication:  ECMO  Allergies  Allergen Reactions   Amoxicillin-Pot Clavulanate     Per pt report on 08/19/20, makes his urine dark colored   Atorvastatin Other (See Comments)     ( pt states causing runny nose, headaches, issue w/ urination)   Plant Derived Enzymes     Other reaction(s): Unknown   Trichophyton Other (See Comments)    Patient Measurements: Height: 5\' 10"  (177.8 cm) Weight: 91 kg (200 lb 9.9 oz) IBW/kg (Calculated) : 73  Vital Signs: Temp: 98.7 F (37.1 C) (02/19 1600) Temp Source: Oral (02/19 1600) BP: 121/66 (02/19 1800) Pulse Rate: 77 (02/19 1900)  Labs: Recent Labs    06/05/21 0221 06/05/21 0514 06/05/21 2208 06/06/21 0037 06/06/21 0353 06/06/21 0424 06/06/21 0532 06/06/21 0730 06/06/21 1656 06/06/21 1715 06/07/21 0419 06/07/21 0429 06/07/21 1407 06/07/21 1617 06/07/21 1643  HGB 10.0*   < >  --   --  11.0*   < >  --    < > 10.7*   < > 9.9*   < > 10.9* 9.8* 9.9*  HCT 32.1*   < >  --   --  33.3*   < >  --    < > 32.8*   < > 30.3*   < > 32.0* 30.2* 29.0*  PLT 121*   < >  --   --  143*  --   --   --  138*  --  133*  --   --  PLATELET CLUMPS NOTED ON SMEAR, UNABLE TO ESTIMATE  --   APTT 64*   < >  --   --  66*  --   --   --  67*  --  57*  --   --  62*  --   LABPROT 24.1*  --   --   --  23.2*  --   --   --   --   --  24.3*  --   --   --   --   INR 2.2*  --   --   --  2.1*  --   --   --   --   --  2.2*  --   --   --   --   CREATININE 1.01   < > 1.04  --  1.01  --   --   --  1.03  --  1.11  --   --  1.02  --   TROPONINIHS  --   --  211* 226*  --   --  196*  --   --   --   --   --   --   --   --    < > = values in this interval not displayed.     Estimated Creatinine Clearance: 75.4 mL/min (by C-G formula based on SCr of 1.02 mg/dL).   Medical History: Past Medical History:  Diagnosis Date   Allergic rhinitis    Prostate cancer ([place])     Medications:  Infusions:   sodium  chloride Stopped (06/01/21 1121)   sodium chloride 10 mL/hr at 06/07/21 1900   sodium chloride Stopped (06/06/21 1924)   albumin human Stopped (06/06/21 7989)   albumin human 12.5 g (06/07/21 1736)   bivalirudin (ANGIOMAX) infusion 0.5 mg/mL (Non-ACS indications) 0.065 mg/kg/hr (06/07/21 1900)   dexmedetomidine (PRECEDEX) IV infusion Stopped (06/05/21 0655)   feeding supplement (VITAL 1.5 CAL) 60 mL/hr at 06/07/21  1800   fentaNYL infusion INTRAVENOUS 100 mcg/hr (06/07/21 1900)   furosemide (LASIX) 200 mg in dextrose 5% 100 mL (2mg /mL) infusion 8 mg/hr (06/07/21 1900)   vancomycin 1,750 mg (06/07/21 0914)    Assessment: 71 year old male with coronary artery disease who initially presented with chest pain, noted to have multivessel coronary artery disease. He underwent CABG x3 on 04/24/2021; course was complicated with atrial fibrillation, frequent hiccups and aspiration pneumonia leading to hypoxia and increasing oxygen requirement. Patient now requiring VV ECMO. Pharmacy consulted for bivalirudin IV.  Aptt at goal (62). Hg= 9.9   Goal of Therapy:  aPTT 50-80 seconds Monitor platelets by anticoagulation protocol: Yes   Plan:  Continue bivalirudin at 0.065 mg/kg/hr Monitor q12h aptt and CBC Monitor closely for s/sx bleeding/thrombus  Hildred Laser, PharmD Clinical Pharmacist **Pharmacist phone directory can now be found on [email or web address] ([email or web address]).  Listed under [place].

## 2021-06-07 NOTE — Progress Notes (Signed)
eLink Physician-Brief Progress Note Patient Name: Randall Hodges DOB: 1950-09-17 MRN: [id number]   Date of Service  06/07/2021  HPI/Events of Note  Received request to communicate with patient's wife Pam who is concerned that sedation is increased esp during the night. Her concern is that her husband's lung will not heal if its not allowed to work due to heavy sedation.  I explained to her over the phone that [name]'s oxygen is quite low despite ECMO and maximum ventilator support and that sedation is to decrease oxygen utilization and also would allow the lung to rest and heal and prevent more damage. I also informed her that the bedside rounding team did try to come down on the sedation but reportedly patient did not tolerate this.  Wife also raised concern regarding lack of staff at night and on the weekends.  eICU Interventions  Discussed with bedside RN my conversation with wife and request that bedside rounding team discuss with wife and clarify her concerns.     Intervention Category Minor Interventions: Communication with other healthcare providers and/or family  Judd Lien 06/07/2021, 9:39 PM

## 2021-06-07 NOTE — Progress Notes (Signed)
ANTICOAGULATION CONSULT NOTE  Pharmacy Consult for Bivalirudin Indication:  ECMO  Allergies  Allergen Reactions   Amoxicillin-Pot Clavulanate     Per pt report on 08/19/20, makes his urine dark colored   Atorvastatin Other (See Comments)     ( pt states causing runny nose, headaches, issue w/ urination)   Plant Derived Enzymes     Other reaction(s): Unknown   Trichophyton Other (See Comments)    Patient Measurements: Height: 5\' 10"  (177.8 cm) Weight: 91 kg (200 lb 9.9 oz) IBW/kg (Calculated) : 73  Vital Signs: Temp: 98.7 F (37.1 C) (02/19 0400) Temp Source: Oral (02/19 0400) BP: 115/62 (02/19 0700) Pulse Rate: 81 (02/19 0700)  Labs: Recent Labs    06/05/21 0221 06/05/21 0514 06/05/21 2208 06/06/21 0037 06/06/21 0353 06/06/21 0424 06/06/21 0532 06/06/21 0730 06/06/21 1656 06/06/21 1715 06/06/21 2040 06/07/21 0419 06/07/21 0429  HGB 10.0*   < >  --   --  11.0*   < >  --    < > 10.7*   < > 11.2* 9.9* 9.9*  HCT 32.1*   < >  --   --  33.3*   < >  --    < > 32.8*   < > 33.0* 30.3* 29.0*  PLT 121*   < >  --   --  143*  --   --   --  138*  --   --  133*  --   APTT 64*   < >  --   --  66*  --   --   --  67*  --   --  57*  --   LABPROT 24.1*  --   --   --  23.2*  --   --   --   --   --   --  24.3*  --   INR 2.2*  --   --   --  2.1*  --   --   --   --   --   --  2.2*  --   CREATININE 1.01   < > 1.04  --  1.01  --   --   --  1.03  --   --  1.11  --   TROPONINIHS  --   --  211* 226*  --   --  196*  --   --   --   --   --   --    < > = values in this interval not displayed.     Estimated Creatinine Clearance: 69.2 mL/min (by C-G formula based on SCr of 1.11 mg/dL).   Medical History: Past Medical History:  Diagnosis Date   Allergic rhinitis    Prostate cancer ([place])     Medications:  Infusions:   sodium chloride Stopped (06/01/21 1121)   sodium chloride 10 mL/hr at 06/07/21 0700   sodium chloride Stopped (06/06/21 1924)   albumin human Stopped (06/06/21 3664)    albumin human 12.5 g (06/06/21 2217)   amiodarone 30 mg/hr (06/07/21 0700)   bivalirudin (ANGIOMAX) infusion 0.5 mg/mL (Non-ACS indications) 0.065 mg/kg/hr (06/07/21 0700)   dexmedetomidine (PRECEDEX) IV infusion Stopped (06/05/21 0655)   feeding supplement (VITAL 1.5 CAL) 1,000 mL (06/07/21 4034)   fentaNYL infusion INTRAVENOUS 100 mcg/hr (06/07/21 0700)   furosemide (LASIX) 200 mg in dextrose 5% 100 mL (2mg /mL) infusion 8 mg/hr (06/07/21 0700)   vancomycin 1,000 mg (06/06/21 7425)    Assessment: 71 year old male with coronary artery disease who  initially presented with chest pain, noted to have multivessel coronary artery disease. He underwent CABG x3 on 04/23/2021; course was complicated with atrial fibrillation, frequent hiccups and aspiration pneumonia leading to hypoxia and increasing oxygen requirement. Patient now requiring VV ECMO. Pharmacy consulted for bivalirudin IV.  Aptt at goal (57) Pltc stable at 133, hgb dropped from 11.2 to 9.9 LDH stable at 225 and fibrinogen stable at 351  Goal of Therapy:  aPTT 50-80 seconds Monitor platelets by anticoagulation protocol: Yes   Plan:  Continue bivalirudin at 0.065 mg/kg/hr Monitor q12h aptt and CBC Monitor closely for s/sx bleeding/thrombus  Cathrine Muster, PharmD PGY2 Cardiology Pharmacy Resident Phone: [phone] 06/07/2021  7:28 AM  Please check [email or web address] for unit-specific pharmacy phone numbers.

## 2021-06-07 NOTE — Progress Notes (Signed)
NAME:  WILFRID HYSER, MRN:  [id number], DOB:  1951/04/01, LOS: 64 ADMISSION DATE:  05/12/2021, CONSULTATION DATE: 05/01/2021 REFERRING MD:  Dahlia Byes, MD, CHIEF COMPLAINT: Increasing shortness of breath  History of Present Illness:  71 year old male with coronary artery disease who initially presented with chest pain, noted to have multivessel coronary artery disease, he underwent CABG x3 on 04/1818 23, course was complicated with atrial fibrillation, frequent hiccups and aspiration pneumonia leading to hypoxia and increasing oxygen requirement. PCCM was consulted for evaluation and help with management  Patient stated hiccups are better but continued complain of shortness of breath, cough unable to bring up phlegm.  Pertinent  Medical History  CAD Allergic rhinitis Prostate cancer GERD Pulmonary fibrosis   Significant Hospital Events: Including procedures, antibiotic start and stop dates in addition to other pertinent events   1/18 CABG 1/30 PCCM consult, Worsening O2 needs, likely aspiration, new pressor need 2/2 intubation, placed on VV ECMO 2/5 issues with recirc, bronch neg, improved with supine positioning 2/7 percutaneous tracheostomy 2/10 diuresis and weaning sedation. CT chest shows diffuse ground-glass opacification.  2/11 weaning sedation 2/13 increasing air hunger as oxygenator efficiency decreasing.  2/14 oxygenator changed.  Still having air hunger but able to follow commands and participate with therapy.  2/15 considerable distress with double stacking. Switched to true rest settings - with improvement in distress.  2/16 bronch, NGT placed to LIWS, con't post-pyloric TF 2/17 DCCV overnight for Afib with RVR. Stood at bedside with PT.  Interim History / Subjective:  Some chugging, resolved with albumin. Remains on fentanyl this morning. Was able to stand at bedside with PT yesterday with improvement in saturations.  Objective   Blood pressure 125/60, pulse 84,  temperature 98.7 F (37.1 C), temperature source Oral, resp. rate (!) 21, height _0  (1.778 m), weight (P) 91 kg, SpO2 90 %. CVP:  [5 mmHg-13 mmHg] 8 mmHg  Vent Mode: PCV FiO2 (%):  [50 %-100 %] 70 % Set Rate:  [10 bmp] 10 bmp PEEP:  [10 cmH20] 10 cmH20 Pressure Support:  [10 cmH20] 10 cmH20 Plateau Pressure:  [19 cmH20-20 cmH20] 19 cmH20   Intake/Output Summary (Last 24 hours) at 06/07/2021 8416 Last data filed at 06/07/2021 0636 Gross per 24 hour  Intake 5282.64 ml  Output 6145 ml  Net -862.36 ml    Filed Weights   06/05/21 0500 06/06/21 0500 06/07/21 0600  Weight: 88.6 kg 92.1 kg (P) 91 kg   Examination: General: critically ill appearing man lying in bed in NAD, on ECMO & MV HENT: [place]/ AT, eyes anicteric Neck: RIJ ECMO crescent cannula, trach without bleeding or drainage from site Lungs: absent breath sounds, very low tidal volumes, minimal bloody secretions from trach, not breathing over the vent.  Cardiovascular:  S1S2, RRR Abdomen:  soft, NT Extremities: ongoing dependent edema, no erythema around RUE PICC, good perfusion distal to L radial Aline Neuro: RASS -3, intact cough reflex, squeezes both hands. GU: foley with light yellow urine  vent 01/26/09/70% with Vt ~65cc ECMO 5.1L, 3600 RPM, Sweep 7L  7.45/45/55/31 BG 140-170s BUN 55 Cr 1.11 LDH 225 H/H 9.9/30.3 Platelets 133 PTT 57 INR 2.2 CXR personally reviewed> less aeration, air bronchograms, trach in appropriate position BAL culture> >100K CFU NF  Ancillary tests personally reviewed:     Assessment & Plan:   Acute hypoxic and hypercapnic respiratory failure with hypoxia ARDS Aspiration pneumonitis and pneumonia- normal flora Mild baseline subpleural fibrosis- question if this could be chronic aspiration related.  Does not appear to be significantly progressed since CT in 2019. Most of CT abnormalities are acute airspace disease. -Con't VV ECMO, ultra-lung protective ventilation. Bivalirudin for AC. No  bleeding complications. -VAP prevention protocol -PAD protocol; work on getting off fentanyl today. -trach care per protocol -ABGs and labs per protocol -con't aggressive diuresis, reduce free water  Hiccups, recurrent aspiration episodes. Probably chronic aspiration. -con't reglan & NGT to LIWS -con't baclofen and gabapentin -con't post-pyloric feeding -PPI BID -try to reduce steroids when able  Hypernatremia, resolved -FWF reduced to 200cc Q4h -con't to monitor  CAD with UA s/p CABG x 3, (LIMA-LAD, SVG-OM, SVG-left PDA) NSVT Afib with RVR, DCCV, back in NSR -aspirin, statin -holding coreg due to bradycardia -transition IV to PO amiodarone -tele monitoring  Hypertension -hydralazine PRN; can add scheduled if needed  Possible GPC bacteremia vs contaminant- staph epi in 2/4 bottles -con't vanc -follow cultures until finalized  Hyperglycemia -SSI PRN -goal BG 140-180  Thrombocytopenia -con't to monitor, no need for transfusion  Acute anemia, expected post-op blood loss, consumption from ECMO circuit -monitor -maintain Hb >8  Coagulopathy -con't to monitor  Acute metabolic encephalopathy, ICU delirium -con't enteral meds- morphine, seroquel, klonopin -titrate off fentanyl today -frequent reorientation, family visitation, mobility -ensure adequate pain control  Deconditioning -con't PT, OT  Discussed plan with ECMO team.   Best Practice (right click and "Reselect all SmartList Selections" daily)   Diet/type: TF DVT prophylaxis: Bival CBG: SSI GI prophylaxis: PPI Lines: L [place] TLC, RIJ  Foley:  Yes Code Status:  full code Last date of multidisciplinary goals of care discussion [ 2/16]   This patient is critically ill with multiple organ system failure which requires frequent high complexity decision making, assessment, support, evaluation, and titration of therapies. This was completed through the application of advanced monitoring technologies and  extensive interpretation of multiple databases. During this encounter critical care time was devoted to patient care services described in this note for 40 minutes.  Julian Hy, DO 06/07/21 7:43 AM [facility]

## 2021-06-07 NOTE — Progress Notes (Signed)
Patient ID: BRANDOM KERWIN, male   DOB: May 22, 1950, 71 y.o.   MRN: [id number]   Extracorporeal support note  ECLS support day: 16 Indication: ARDS  Configuration: VV  Drainage cannula: Crescent R IJ Return cannula: Crescent R IJ  Pump speed: 3600 rpm Pump flow: 5.1 L/min Pump used: Cardiohelp  Sweep gas: 7   Circuit check: Good color change. LDH stable 225.  Anticoagulant: Bivalirudin, PTT goal 50-80. PTT 57  Changes in support: No changes today.   Anticipated goals/duration of support: Wean to discontinuation  Loralie Champagne, MD  7:33 AM

## 2021-06-07 NOTE — Progress Notes (Signed)
Patient ECMO circuit suddenly begin chugging, flows dropped to 0.5-1L. Venous pressure excessively alarming.  Flows were decrease momentarily to resolve this chugging event. 250 albumin given. Albumin infusion given and chugging resolved. Patient remain stable throughout this event. No complications noted. Will continue to monitor patient clinical presentation, respiratory status, and all hemodynamic parameters throughout the night.  Aisley Whan L. Tamala Julian, BS, RRT-ACCS, RCP

## 2021-06-07 NOTE — Progress Notes (Signed)
Patient ID: MASSIMILIANO ROHLEDER, male   DOB: 08/12/1950, 71 y.o.   MRN: [id number]    Progress Note  Patient Name: Randall Hodges Date of Encounter: 06/07/2021  [facility] Cardiologist: [name], MD   Subjective   2/2: VV ECMO initiation, [place] catheter right IJ 2/7: Tracheostomy 2/13 CT C/A/P: Diffuse ground-glass opacities consistent with atelectasis and edema presumably related to ECMO physiology and respiratory failure.  Underlying moderate interstitial lung disease mildly progressed from prior CT. 2/15: Oxygenator changed, trach changed.  2/16: Transient asystole with cough and adjustment of tracheostomy overnight.  2/17: Atrial flutter with RVR, required emergent DCCV and amiodarone restarted  CXR with severe bilateral diffuse disease, unchanged.   Remains on vancomycin.  Suspect aspiration again with TFs in trach on 2/16, NGT placed to suction and also has post-pyloric tube for feeds.    Lung protective ventilation with ARDS.  Vent FiO2 0.7. Vt 60 cc.  CVP 9 this morning, on Lasix gtt with excellent diuresis (net negative 1 L and weight down).  Creatinine and BUN stable.  No chugging this morning, had some yesterday and got albumin.   He remains in NSR on amiodarone gtt.   ECMO: Speed 3600 rpm Flow 5.1 L/min pVen -98 DeltaP 28 Sweep 7 PTT 57 (goal 50-80) LDH 225 ABG 7.44/45/55/89% Lactate 1.3   Cardiac Studies: Echo (limited, 1/30): Echo reviewed, there is clear respirophasic variation of the interventricular septum and marked respirophasic variation of E inflow velocity on doppler evaluation of the mitral valve.  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  LV EF 45-50%.   RHC Procedural Findings (on norepinephrine 6): Hemodynamics (mmHg) RA mean 12 RV 37/12 PA 38/16, mean  27 PCWP mean 11 LV 108/12 AO 96/55 PAPI 1.8 Oxygen saturations: PA 54% AO 94% Cardiac Output (Fick) 5.59  Cardiac Index (Fick) 2.81  PVR 2.8 WU Simultaneous RV/LV tracings were obtained.  Difficult to interpret due to atrial fibrillation.  There was some suggestion of discordance (ventricular interdependence) but not clear.  Inpatient Medications    Scheduled Meds:  amiodarone  200 mg Per Tube BID   aspirin  81 mg Per Tube Daily   baclofen  5 mg Per Tube TID   chlorhexidine gluconate (MEDLINE KIT)  15 mL Mouth Rinse BID   Chlorhexidine Gluconate Cloth  6 each Topical Daily   clonazePAM  1 mg Per Tube BID   colchicine  0.6 mg Per Tube Daily   feeding supplement (PROSource TF)  90 mL Per Tube BID   fiber  1 packet Per Tube TID   free water  200 mL Per Tube Q4H   gabapentin  300 mg Per Tube QHS   Gerhardt's butt cream   Topical TID   guaiFENesin  30 mL Per Tube QID   insulin aspart  0-15 Units Subcutaneous Q4H   mouth rinse  15 mL Mouth Rinse 10 times per day   melatonin  5 mg Per Tube QHS   methylPREDNISolone (SOLU-MEDROL) injection  20 mg Intravenous Q24H   metoCLOPramide (REGLAN) injection  5 mg Intravenous Q8H   morphine  30 mg Per Tube Q6H   pantoprazole sodium  40 mg Per Tube BID   QUEtiapine  25 mg Per Tube QHS   rosuvastatin  10 mg Per Tube Daily   sodium chloride flush  10-40 mL Intracatheter Q12H   sodium  chloride flush  10-40 mL Intracatheter Q12H   sodium chloride flush  3 mL Intravenous Q12H   Continuous Infusions:  sodium chloride Stopped (06/01/21 1121)   sodium chloride 10 mL/hr at 06/07/21 0700   sodium chloride Stopped (06/06/21 1924)   albumin human Stopped (06/06/21 1610)   albumin human 12.5 g (06/06/21 2217)   bivalirudin (ANGIOMAX) infusion 0.5 mg/mL (Non-ACS indications) 0.065 mg/kg/hr (06/07/21 0700)   dexmedetomidine (PRECEDEX) IV infusion Stopped (06/05/21 0655)   feeding supplement (VITAL 1.5 CAL) 1,000 mL (06/07/21 0643)   fentaNYL infusion  INTRAVENOUS 100 mcg/hr (06/07/21 0700)   furosemide (LASIX) 200 mg in dextrose 5% 100 mL (44m/mL) infusion 8 mg/hr (06/07/21 0700)   vancomycin 1,000 mg (06/06/21 0824)   PRN Meds: Place/Maintain arterial line **AND** sodium chloride, sodium chloride, sodium chloride, acetaminophen (TYLENOL) oral liquid 160 mg/5 mL, albumin human, albumin human, docusate, fentaNYL, fentaNYL (SUBLIMAZE) injection, fentaNYL (SUBLIMAZE) injection, hydrALAZINE, levalbuterol, lip balm, ondansetron (ZOFRAN) IV, polyvinyl alcohol, sennosides, sodium chloride flush, white petrolatum   Vital Signs    Vitals:   06/07/21 0500 06/07/21 0600 06/07/21 0700 06/07/21 0723  BP: 116/62 125/60 115/62   Pulse: 83 84 81   Resp: 14 (!) 21 (!) 0 10  Temp:      TempSrc:      SpO2: 90% 90% (!) 89% 90%  Weight:  91 kg    Height:        Intake/Output Summary (Last 24 hours) at 06/07/2021 0735 Last data filed at 06/07/2021 0700 Gross per 24 hour  Intake 5394.19 ml  Output 6445 ml  Net -1050.81 ml   Last 3 Weights 06/07/2021 06/06/2021 06/05/2021  Weight (lbs) 200 lb 9.9 oz 203 lb 0.7 oz 195 lb 5.2 oz  Weight (kg) 91 kg 92.1 kg 88.6 kg      Telemetry    NSR 80s (personally reviewed)  Physical Exam   General: NAD Neck: JVP 9-10, no thyromegaly or thyroid nodule.  Lungs: Decreased bilaterally CV: Nondisplaced PMI.  Heart regular S1/S2, no S3/S4, no murmur.  1+ edema to thighs Abdomen: Soft, nontender, no hepatosplenomegaly, no distention.  Skin: Intact without lesions or rashes.  Neurologic: Wakes up confused Extremities: No clubbing or cyanosis.  HEENT: Normal.   Labs    High Sensitivity Troponin:   Recent Labs  Lab 06/05/21 2208 06/06/21 0037 06/06/21 0532  TROPONINIHS 211* 226* 196*      Chemistry Recent Labs  Lab 06/01/21 0350 06/01/21 0353 06/02/21 0345 06/02/21 0539 06/05/21 0221 06/05/21 0514 06/05/21 2208 06/06/21 0353 06/06/21 0424 06/06/21 1656 06/06/21 1715 06/06/21 2040  06/07/21 0419 06/07/21 0429  NA 150*   < > 151*   < > 143   < > 144 144   < > 144   < > 143 143 142  K 4.6   < > 4.3   < > 4.4   < > 3.4* 3.8   < > 4.1   < > 4.0 4.0 4.0  CL 113*   < > 115*   < > 112*   < > 110 107  --  107  --   --  105  --   CO2 30   < > 30   < > 26   < > 25 27  --  29  --   --  29  --   GLUCOSE 168*   < > 148*   < > 162*   < > 175* 152*  --  152*  --   --  155*  --   BUN 87*   < > 85*   < > 62*   < > 58* 56*  --  53*  --   --  55*  --   CREATININE 1.29*   < > 1.25*   < > 1.01   < > 1.04 1.01  --  1.03  --   --  1.11  --   CALCIUM 8.0*   < > 7.9*   < > 7.8*   < > 7.8* 8.0*  --  8.0*  --   --  7.7*  --   MG 2.4  --  2.4  --   --   --  2.0  --   --   --   --   --   --   --   PROT 4.2*  --  4.2*   < > 4.3*  --   --  4.9*  --   --   --   --  4.5*  --   ALBUMIN 2.4*  --  2.6*   < > 2.4*  --   --  2.8*  --   --   --   --  2.8*  --   AST 233*  --  98*   < > 40  --   --  30  --   --   --   --  40  --   ALT 179*  --  161*   < > 101*  --   --  76*  --   --   --   --  73*  --   ALKPHOS 177*  --  137*   < > 95  --   --  90  --   --   --   --  89  --   BILITOT 1.6*  --  1.0   < > 0.8  --   --  0.6  --   --   --   --  0.7  --   GFRNONAA 59*   < > >60   < > >60   < > >60 >60  --  >60  --   --  >60  --   ANIONGAP 7   < > 6   < > 5   < > 9 10  --  8  --   --  9  --    < > = values in this interval not displayed.    Lipids  No results for input(s): CHOL, TRIG, HDL, LABVLDL, LDLCALC, CHOLHDL in the last 168 hours.   Hematology Recent Labs  Lab 06/06/21 0353 06/06/21 0424 06/06/21 1656 06/06/21 1715 06/06/21 2040 06/07/21 0419 06/07/21 0429  WBC 10.4  --  11.5*  --   --  9.5  --   RBC 3.65*  --  3.57*  --   --  3.33*  --   HGB 11.0*   < > 10.7*   < > 11.2* 9.9* 9.9*  HCT 33.3*   < > 32.8*   < > 33.0* 30.3* 29.0*  MCV 91.2  --  91.9  --   --  91.0  --   MCH 30.1  --  30.0  --   --  29.7  --   MCHC 33.0  --  32.6  --   --  32.7  --   RDW 17.4*  --  17.5*  --   --  17.5*  --  PLT 143*  --  138*  --   --  133*  --    < > = values in this interval not displayed.   Thyroid No results for input(s): TSH, FREET4 in the last 168 hours.  BNPNo results for input(s): BNP, PROBNP in the last 168 hours.  DDimer No results for input(s): DDIMER in the last 168 hours.   Radiology    DG CHEST PORT 1 VIEW  Result Date: 06/06/2021 CLINICAL DATA:  Lurline Idol present, on ECMO EXAM: PORTABLE CHEST 1 VIEW COMPARISON:  Radiograph 06/05/2021 FINDINGS: Unchanged tracheostomy tube and ECMO cannula. Right upper extremity PICC tip overlies the right atrium. There are 2 enteric tubes passing below the diaphragm, one of which has a tip excluded by collimation and the other tip overlying the stomach and slightly retracted from prior exam. Unchanged obscured cardiomediastinal silhouette. Similar diffuse airspace disease, slight small areas of improvement in the right upper and lower lung. Unchanged effusions. No visible pneumothorax. Unchanged rib fractures. IMPRESSION: Similar diffuse airspace disease with slight small areas of improvement in the right upper and lower lung. Unchanged effusions. Lines and tubes as described above. Electronically Signed   By: Maurine Simmering M.D.   On: 06/06/2021 08:24    Cardiac Studies   Cath 05/19/2021 Distal left main [place] 111 bifurcation stenosis with 75% left main, 90% ostial to proximal LAD, and 80-90% ostial circumflex (difficult to assess due to heavy calcification). Severe mid circumflex disease with 70% eccentric mid stenosis and second obtuse marginal containing ostial to proximal greater than 80% stenosis.  (Bifurcation [place] 111 Severe calcification in left main and LAD in particular with diffuse 50% narrowing from proximal to mid vessel and tandem 70% stenoses in the mid LAD. Nondominant right coronary Normal LV function.  EF 55%.  LVEDP normal.    Patient Profile     71 y.o. male with PMH of PVCs presented with chest pain. Cardiac cath by Dr. Tamala Julian  on 05/11/2021 showed 75% left main, 90% ost to prox LAD, 80-90% ost LCx, 70% mid LCx, 80% OM2, 50% prox to mid LAD, 70% mid LAD, EF 55%. Patient underwent CABG x 3 on 05/12/2021. Post op course complicated afib, treated with amio. CXR showed opacity in bilateral lung, started abx on 1/25 and diuretic. Started on Eliquis due to recurrence of afib.    Assessment & Plan    1. CAD: Admitted with unstable angina, cath with severe left main and proximal LAD/LCx disease (nondominant RCA).  CABG x 3 on 1/18 with LIMA-LAD, SVG-OM, SVG-left PDA.  No s/s angina  - Continue ASA 81, statin.  2. 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 RHC 1/30 with equalization of diastolic pressures.  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  Repeated echo 2/2 still showed respirophasic septal variation but not as impressive. CVP 9 today, has extensive edema.  Lungs still with diffuse bilateral infiltrates on CXR.  Diuresed well with IV Lasix gtt yesterday, BUN/creatinine stable.  - Continue Lasix gtt 8 mg/hr, can decrease with excessive chugging.  - With concern for development of post-surgical effusive/constrictive pericarditis, he is on colchicine.  - ?need for surgical intervention on effusive/constrictive pericarditis. Will need improvement of  lung disease first then reassess, repeat echo not as impressive.   3. 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  Suspect primarily septic/vasodilatory shock. He is now off pressors.  4. Hiccups: Intractable initially, now improved.   - On baclofen and gabapentin.  5. PNA with acute hypoxic respiratory failure: Of note, he does appear to have had some pre-existing ILD from 2019 CT chest (?sarcoidosis). CXR with persistent bilateral infiltrates, possible mild improvement compared to yesterday.  Have thought most likely aspiration PNA/pneumonitis in setting of intractable hiccups. He has developed ARDS. COVID was negative.  FiO2 0.7 on vent.  We have struggled to wean sweep, now back to 6. CXR with bilateral diffuse airspace disease, unchanged. Has tracheostomy. CT chest 2/9 with persistent lung infiltrates.  Completed vancomycin and meropenem => MRSE 1/2 blood and trach aspirate, now back on vancomycin.  Suspect ongoing aspiration with TFs noted in trach on 2/16, now with NGT to suction and post-pyloric feeding tube.  LDH stable today. Stable ECMO circuit.  Lung protective ventilation.  - Vent and sedation per CCM.  - Solumedrol 20 mg IV daily with gradual taper, ESR now normal.  - Back on amiodarone but suspect not amiodarone lung toxicity.   - Complete 7 days vancomycin.  6. Atypical atrial flutter/PVCs:  DCCV 2/17.  Remains in NSR, now back on amiodarone gtt.  - Transition to po amiodarone 200 bid.   - Bivalirudin gtt, goal PTT 50-80.  7. AKI: Creatinine stable 1.11.  Follow closely.  8. Elevated LFTs: Suspect shock liver, follow CMET.  LFTs trending down.  9. Anemia: Transfuse hgb < 8.     10. Hypernatremia: Na 143 today.  - Decrease free water to 200 q4.  11. HTN: BP rises with sedation wean.  - prn hydralazine for now.  12. Bradycardia/asystole: Vagally mediated with cough.  Resolves rapidly with resolution of cough.  - Atropine at bedside.  13. FEN: TFs restarted with post-pyloric tube and also NGT to suction to prevent aspiration.   - Continue Reglan.   14. Mobilize as much as possible.  Wean Fentanyl today.   CRITICAL CARE Performed by: Loralie Champagne  Total critical care time: 45 minutes  Critical care time  was exclusive of separately billable procedures and treating other patients.  Critical care was necessary to treat or prevent imminent or life-threatening deterioration.  Critical care was time spent personally by me on the following activities: development of treatment plan with patient and/or surrogate as well as nursing, discussions with consultants, evaluation of patient's response to treatment, examination of patient, obtaining history from patient or surrogate, ordering and performing treatments and interventions, ordering and review of laboratory studies, ordering and review of radiographic studies, pulse oximetry and re-evaluation of patient's condition.  Loralie Champagne MD 06/07/2021 7:35 AM

## 2021-06-08 ENCOUNTER — Inpatient Hospital Stay (HOSPITAL_COMMUNITY): Payer: PPO

## 2021-06-08 DIAGNOSIS — J9601 Acute respiratory failure with hypoxia: Secondary | ICD-10-CM | POA: Diagnosis not present

## 2021-06-08 DIAGNOSIS — Z7189 Other specified counseling: Secondary | ICD-10-CM | POA: Diagnosis not present

## 2021-06-08 DIAGNOSIS — J80 Acute respiratory distress syndrome: Secondary | ICD-10-CM | POA: Diagnosis not present

## 2021-06-08 DIAGNOSIS — Z515 Encounter for palliative care: Secondary | ICD-10-CM | POA: Diagnosis not present

## 2021-06-08 DIAGNOSIS — I249 Acute ischemic heart disease, unspecified: Secondary | ICD-10-CM | POA: Diagnosis not present

## 2021-06-08 LAB — BASIC METABOLIC PANEL
Anion gap: 10 (ref 5–15)
Anion gap: 10 (ref 5–15)
BUN: 56 mg/dL — ABNORMAL HIGH (ref 8–23)
BUN: 55 mg/dL — ABNORMAL HIGH (ref 8–23)
CO2: 31 mmol/L (ref 22–32)
CO2: 32 mmol/L (ref 22–32)
Calcium: 8 mg/dL — ABNORMAL LOW (ref 8.9–10.3)
Calcium: 8 mg/dL — ABNORMAL LOW (ref 8.9–10.3)
Chloride: 105 mmol/L (ref 98–111)
Chloride: 104 mmol/L (ref 98–111)
Creatinine, Ser: 1.09 mg/dL (ref 0.61–1.24)
Creatinine, Ser: 1.08 mg/dL (ref 0.61–1.24)
GFR, Estimated: >60 mL/min (ref >60)
GFR, Estimated: >60 mL/min (ref >60)
Glucose, Bld: 167 mg/dL — ABNORMAL HIGH (ref 70–99)
Glucose, Bld: 165 mg/dL — ABNORMAL HIGH (ref 70–99)
Potassium: 4.2 mmol/L (ref 3.5–5.1)
Potassium: 3.6 mmol/L (ref 3.5–5.1)
Sodium: 146 mmol/L — ABNORMAL HIGH (ref 135–145)
Sodium: 146 mmol/L — ABNORMAL HIGH (ref 135–145)

## 2021-06-08 LAB — POCT I-STAT 7, (LYTES, BLD GAS, ICA,H+H)
Acid-Base Excess: 5 mmol/L — ABNORMAL HIGH (ref 0.0–2.0)
Acid-Base Excess: 7 mmol/L — ABNORMAL HIGH (ref 0.0–2.0)
Acid-Base Excess: 7 mmol/L — ABNORMAL HIGH (ref 0.0–2.0)
Acid-Base Excess: 8 mmol/L — ABNORMAL HIGH (ref 0.0–2.0)
Acid-Base Excess: 8 mmol/L — ABNORMAL HIGH (ref 0.0–2.0)
Acid-Base Excess: 8 mmol/L — ABNORMAL HIGH (ref 0.0–2.0)
Acid-Base Excess: 9 mmol/L — ABNORMAL HIGH (ref 0.0–2.0)
Acid-Base Excess: 9 mmol/L — ABNORMAL HIGH (ref 0.0–2.0)
Bicarbonate: 31 mmol/L — ABNORMAL HIGH (ref 20.0–28.0)
Bicarbonate: 31.6 mmol/L — ABNORMAL HIGH (ref 20.0–28.0)
Bicarbonate: 32.2 mmol/L — ABNORMAL HIGH (ref 20.0–28.0)
Bicarbonate: 32.3 mmol/L — ABNORMAL HIGH (ref 20.0–28.0)
Bicarbonate: 32.8 mmol/L — ABNORMAL HIGH (ref 20.0–28.0)
Bicarbonate: 33.6 mmol/L — ABNORMAL HIGH (ref 20.0–28.0)
Bicarbonate: 34 mmol/L — ABNORMAL HIGH (ref 20.0–28.0)
Bicarbonate: 34.3 mmol/L — ABNORMAL HIGH (ref 20.0–28.0)
Calcium, Ion: 1.12 mmol/L — ABNORMAL LOW (ref 1.15–1.40)
Calcium, Ion: 1.15 mmol/L (ref 1.15–1.40)
Calcium, Ion: 1.16 mmol/L (ref 1.15–1.40)
Calcium, Ion: 1.17 mmol/L (ref 1.15–1.40)
Calcium, Ion: 1.18 mmol/L (ref 1.15–1.40)
Calcium, Ion: 1.19 mmol/L (ref 1.15–1.40)
Calcium, Ion: 1.2 mmol/L (ref 1.15–1.40)
Calcium, Ion: 1.23 mmol/L (ref 1.15–1.40)
HCT: 27 % — ABNORMAL LOW (ref 39.0–52.0)
HCT: 27 % — ABNORMAL LOW (ref 39.0–52.0)
HCT: 28 % — ABNORMAL LOW (ref 39.0–52.0)
HCT: 29 % — ABNORMAL LOW (ref 39.0–52.0)
HCT: 29 % — ABNORMAL LOW (ref 39.0–52.0)
HCT: 30 % — ABNORMAL LOW (ref 39.0–52.0)
HCT: 30 % — ABNORMAL LOW (ref 39.0–52.0)
HCT: 30 % — ABNORMAL LOW (ref 39.0–52.0)
Hemoglobin: 10.2 g/dL — ABNORMAL LOW (ref 13.0–17.0)
Hemoglobin: 10.2 g/dL — ABNORMAL LOW (ref 13.0–17.0)
Hemoglobin: 10.2 g/dL — ABNORMAL LOW (ref 13.0–17.0)
Hemoglobin: 9.2 g/dL — ABNORMAL LOW (ref 13.0–17.0)
Hemoglobin: 9.2 g/dL — ABNORMAL LOW (ref 13.0–17.0)
Hemoglobin: 9.5 g/dL — ABNORMAL LOW (ref 13.0–17.0)
Hemoglobin: 9.9 g/dL — ABNORMAL LOW (ref 13.0–17.0)
Hemoglobin: 9.9 g/dL — ABNORMAL LOW (ref 13.0–17.0)
O2 Saturation: 85 %
O2 Saturation: 88 %
O2 Saturation: 89 %
O2 Saturation: 89 %
O2 Saturation: 90 %
O2 Saturation: 91 %
O2 Saturation: 94 %
O2 Saturation: 96 %
Patient temperature: 36.7
Patient temperature: 36.7
Patient temperature: 36.7
Patient temperature: 36.7
Patient temperature: 36.7
Patient temperature: 36.7
Patient temperature: 36.8
Patient temperature: 37
Potassium: 3.5 mmol/L (ref 3.5–5.1)
Potassium: 3.7 mmol/L (ref 3.5–5.1)
Potassium: 3.8 mmol/L (ref 3.5–5.1)
Potassium: 4.1 mmol/L (ref 3.5–5.1)
Potassium: 4.1 mmol/L (ref 3.5–5.1)
Potassium: 4.1 mmol/L (ref 3.5–5.1)
Potassium: 4.7 mmol/L (ref 3.5–5.1)
Potassium: 4.9 mmol/L (ref 3.5–5.1)
Sodium: 143 mmol/L (ref 135–145)
Sodium: 144 mmol/L (ref 135–145)
Sodium: 146 mmol/L — ABNORMAL HIGH (ref 135–145)
Sodium: 146 mmol/L — ABNORMAL HIGH (ref 135–145)
Sodium: 146 mmol/L — ABNORMAL HIGH (ref 135–145)
Sodium: 146 mmol/L — ABNORMAL HIGH (ref 135–145)
Sodium: 146 mmol/L — ABNORMAL HIGH (ref 135–145)
Sodium: 147 mmol/L — ABNORMAL HIGH (ref 135–145)
TCO2: 33 mmol/L — ABNORMAL HIGH (ref 22–32)
TCO2: 33 mmol/L — ABNORMAL HIGH (ref 22–32)
TCO2: 34 mmol/L — ABNORMAL HIGH (ref 22–32)
TCO2: 34 mmol/L — ABNORMAL HIGH (ref 22–32)
TCO2: 34 mmol/L — ABNORMAL HIGH (ref 22–32)
TCO2: 35 mmol/L — ABNORMAL HIGH (ref 22–32)
TCO2: 36 mmol/L — ABNORMAL HIGH (ref 22–32)
TCO2: 36 mmol/L — ABNORMAL HIGH (ref 22–32)
pCO2 arterial: 43.6 mmHg (ref 32–48)
pCO2 arterial: 43.7 mmHg (ref 32–48)
pCO2 arterial: 43.7 mmHg (ref 32–48)
pCO2 arterial: 43.9 mmHg (ref 32–48)
pCO2 arterial: 47.2 mmHg (ref 32–48)
pCO2 arterial: 47.4 mmHg (ref 32–48)
pCO2 arterial: 49.9 mmHg — ABNORMAL HIGH (ref 32–48)
pCO2 arterial: 53.4 mmHg — ABNORMAL HIGH (ref 32–48)
pH, Arterial: 7.4 (ref 7.35–7.45)
pH, Arterial: 7.412 (ref 7.35–7.45)
pH, Arterial: 7.439 (ref 7.35–7.45)
pH, Arterial: 7.465 — ABNORMAL HIGH (ref 7.35–7.45)
pH, Arterial: 7.468 — ABNORMAL HIGH (ref 7.35–7.45)
pH, Arterial: 7.475 — ABNORMAL HIGH (ref 7.35–7.45)
pH, Arterial: 7.483 — ABNORMAL HIGH (ref 7.35–7.45)
pH, Arterial: 7.491 — ABNORMAL HIGH (ref 7.35–7.45)
pO2, Arterial: 50 mmHg — ABNORMAL LOW (ref 83–108)
pO2, Arterial: 51 mmHg — ABNORMAL LOW (ref 83–108)
pO2, Arterial: 52 mmHg — ABNORMAL LOW (ref 83–108)
pO2, Arterial: 55 mmHg — ABNORMAL LOW (ref 83–108)
pO2, Arterial: 56 mmHg — ABNORMAL LOW (ref 83–108)
pO2, Arterial: 56 mmHg — ABNORMAL LOW (ref 83–108)
pO2, Arterial: 66 mmHg — ABNORMAL LOW (ref 83–108)
pO2, Arterial: 78 mmHg — ABNORMAL LOW (ref 83–108)

## 2021-06-08 LAB — HEPATIC FUNCTION PANEL
ALT: 90 U/L — ABNORMAL HIGH (ref 0–44)
AST: 58 U/L — ABNORMAL HIGH (ref 15–41)
Albumin: 2.9 g/dL — ABNORMAL LOW (ref 3.5–5.0)
Alkaline Phosphatase: 97 U/L (ref 38–126)
Bilirubin, Direct: 0.2 mg/dL (ref 0.0–0.2)
Indirect Bilirubin: 0.3 mg/dL (ref 0.3–0.9)
Total Bilirubin: 0.5 mg/dL (ref 0.3–1.2)
Total Protein: 4.9 g/dL — ABNORMAL LOW (ref 6.5–8.1)

## 2021-06-08 LAB — CBC
HCT: 28.5 % — ABNORMAL LOW (ref 39.0–52.0)
HCT: 29.5 % — ABNORMAL LOW (ref 39.0–52.0)
Hemoglobin: 9 g/dL — ABNORMAL LOW (ref 13.0–17.0)
Hemoglobin: 9.3 g/dL — ABNORMAL LOW (ref 13.0–17.0)
MCH: 29.7 pg (ref 26.0–34.0)
MCH: 29.9 pg (ref 26.0–34.0)
MCHC: 31.6 g/dL (ref 30.0–36.0)
MCHC: 31.5 g/dL (ref 30.0–36.0)
MCV: 94.1 fL (ref 80.0–100.0)
MCV: 94.9 fL (ref 80.0–100.0)
Platelets: 123 10*3/uL — ABNORMAL LOW (ref 150–400)
Platelets: 114 10*3/uL — ABNORMAL LOW (ref 150–400)
RBC: 3.03 MIL/uL — ABNORMAL LOW (ref 4.22–5.81)
RBC: 3.11 MIL/uL — ABNORMAL LOW (ref 4.22–5.81)
RDW: 17.4 % — ABNORMAL HIGH (ref 11.5–15.5)
RDW: 17.7 % — ABNORMAL HIGH (ref 11.5–15.5)
WBC: 7.8 10*3/uL (ref 4.0–10.5)
WBC: 9.5 10*3/uL (ref 4.0–10.5)
nRBC: 0 % (ref 0.0–0.2)
nRBC: 0 % (ref 0.0–0.2)

## 2021-06-08 LAB — GLUCOSE, CAPILLARY
Glucose-Capillary: 115 mg/dL — ABNORMAL HIGH (ref 70–99)
Glucose-Capillary: 122 mg/dL — ABNORMAL HIGH (ref 70–99)
Glucose-Capillary: 127 mg/dL — ABNORMAL HIGH (ref 70–99)
Glucose-Capillary: 135 mg/dL — ABNORMAL HIGH (ref 70–99)
Glucose-Capillary: 152 mg/dL — ABNORMAL HIGH (ref 70–99)
Glucose-Capillary: 176 mg/dL — ABNORMAL HIGH (ref 70–99)

## 2021-06-08 LAB — APTT
aPTT: 61 seconds — ABNORMAL HIGH (ref 24–36)
aPTT: 63 seconds — ABNORMAL HIGH (ref 24–36)

## 2021-06-08 LAB — LACTATE DEHYDROGENASE: LDH: 227 U/L — ABNORMAL HIGH (ref 98–192)

## 2021-06-08 LAB — MAGNESIUM: Magnesium: 2.1 mg/dL (ref 1.7–2.4)

## 2021-06-08 LAB — PROTIME-INR
INR: 2.2 — ABNORMAL HIGH (ref 0.8–1.2)
Prothrombin Time: 24.7 seconds — ABNORMAL HIGH (ref 11.4–15.2)

## 2021-06-08 LAB — FIBRINOGEN: Fibrinogen: 299 mg/dL (ref 210–475)

## 2021-06-08 LAB — LACTIC ACID, PLASMA: Lactic Acid, Venous: 1.3 mmol/L (ref 0.5–1.9)

## 2021-06-08 LAB — VANCOMYCIN, TROUGH: Vancomycin Tr: 19 ug/mL (ref 15–20)

## 2021-06-08 MED ORDER — POTASSIUM CHLORIDE CRYS ER 20 MEQ PO TBCR
40.0000 meq | EXTENDED_RELEASE_TABLET | Freq: Two times a day (BID) | ORAL | Status: DC
  Filled 2021-06-08: qty 2

## 2021-06-08 MED ORDER — AMIODARONE IV BOLUS ONLY 150 MG/100ML
150.0000 mg | Freq: Once | INTRAVENOUS | Status: AC
  Administered 2021-06-08: 150 mg via INTRAVENOUS

## 2021-06-08 MED ORDER — AMIODARONE HCL IN DEXTROSE 360-4.14 MG/200ML-% IV SOLN
30.0000 mg/h | INTRAVENOUS | Status: DC
  Administered 2021-06-08 – 2021-06-13 (×12): 30 mg/h via INTRAVENOUS
  Administered 2021-06-14 – 2021-06-15 (×5): 60 mg/h via INTRAVENOUS
  Administered 2021-06-15: 30 mg/h via INTRAVENOUS
  Administered 2021-06-15: 60 mg/h via INTRAVENOUS
  Administered 2021-06-16 (×2): 30 mg/h via INTRAVENOUS
  Filled 2021-06-08 (×19): qty 200
  Filled 2021-06-08: qty 400
  Filled 2021-06-08: qty 200

## 2021-06-08 MED ORDER — POTASSIUM CHLORIDE 20 MEQ PO PACK
40.0000 meq | PACK | ORAL | Status: AC
  Administered 2021-06-08 (×2): 40 meq via ORAL
  Filled 2021-06-08 (×2): qty 2

## 2021-06-08 MED ORDER — AMIODARONE HCL IN DEXTROSE 360-4.14 MG/200ML-% IV SOLN
60.0000 mg/h | INTRAVENOUS | Status: AC
  Administered 2021-06-08: 60 mg/h via INTRAVENOUS
  Filled 2021-06-08: qty 200

## 2021-06-08 NOTE — Progress Notes (Signed)
ANTICOAGULATION CONSULT NOTE  Pharmacy Consult for Bivalirudin Indication:  ECMO  Allergies  Allergen Reactions   Amoxicillin-Pot Clavulanate     Per pt report on 08/19/20, makes his urine dark colored   Atorvastatin Other (See Comments)     ( pt states causing runny nose, headaches, issue w/ urination)   Plant Derived Enzymes     Other reaction(s): Unknown   Trichophyton Other (See Comments)    Patient Measurements: Height: 5\' 10"  (177.8 cm) Weight: 87.8 kg (193 lb 9 oz) IBW/kg (Calculated) : 73  Vital Signs: Temp: 98.4 F (36.9 C) (02/19 2000) Temp Source: Oral (02/19 2000) BP: 166/77 (02/20 0600) Pulse Rate: 91 (02/20 0600)  Labs: Recent Labs    06/05/21 2208 06/06/21 0037 06/06/21 0353 06/06/21 0424 06/06/21 0532 06/06/21 0730 06/07/21 0419 06/07/21 0429 06/07/21 1617 06/07/21 1643 06/07/21 2319 06/08/21 0441 06/08/21 0444  HGB  --   --  11.0*   < >  --    < > 9.9*   < > 9.8*   < > 8.8* 9.9* 9.0*  HCT  --   --  33.3*   < >  --    < > 30.3*   < > 30.2*   < > 26.0* 29.0* 28.5*  PLT  --   --  143*  --   --    < > 133*  --  PLATELET CLUMPS NOTED ON SMEAR, UNABLE TO ESTIMATE  --   --   --  123*  APTT  --   --  66*  --   --    < > 57*  --  62*  --   --   --  61*  LABPROT  --   --  23.2*  --   --   --  24.3*  --   --   --   --   --  24.7*  INR  --   --  2.1*  --   --   --  2.2*  --   --   --   --   --  2.2*  CREATININE 1.04  --  1.01  --   --    < > 1.11  --  1.02  --   --   --  1.09  TROPONINIHS 211* 226*  --   --  196*  --   --   --   --   --   --   --   --    < > = values in this interval not displayed.     Estimated Creatinine Clearance: 69.4 mL/min (by C-G formula based on SCr of 1.09 mg/dL).   Medical History: Past Medical History:  Diagnosis Date   Allergic rhinitis    Prostate cancer ([place])     Medications:  Infusions:   sodium chloride Stopped (06/01/21 1121)   sodium chloride 10 mL/hr at 06/08/21 0500   sodium chloride Stopped (06/06/21 1924)    albumin human Stopped (06/06/21 0865)   albumin human 12.5 g (06/07/21 2241)   bivalirudin (ANGIOMAX) infusion 0.5 mg/mL (Non-ACS indications) 0.065 mg/kg/hr (06/08/21 0500)   dexmedetomidine (PRECEDEX) IV infusion Stopped (06/05/21 0655)   feeding supplement (VITAL 1.5 CAL) 1,000 mL (06/08/21 0248)   fentaNYL infusion INTRAVENOUS 50 mcg/hr (06/08/21 0500)   furosemide (LASIX) 200 mg in dextrose 5% 100 mL (2mg /mL) infusion 8 mg/hr (06/08/21 0500)   vancomycin 1,750 mg (06/07/21 0914)    Assessment: 71 year old male with coronary artery disease who  initially presented with chest pain, noted to have multivessel coronary artery disease. He underwent CABG x3 on 04/30/2021; course was complicated with atrial fibrillation, frequent hiccups and aspiration pneumonia leading to hypoxia and increasing oxygen requirement. Patient now requiring VV ECMO. Pharmacy consulted for bivalirudin IV.  aPTT this morning therapeutic at 61 seconds, CBC stable, pltc remains low.  Goal of Therapy:  aPTT 50-80 seconds Monitor platelets by anticoagulation protocol: Yes   Plan:  Continue bivalirudin at 0.065 mg/kg/hr Monitor q12h aptt and CBC Monitor closely for s/sx bleeding/thrombus   Arrie Senate, PharmD, BCPS, [facility] Clinical Pharmacist [phone] Please check AMION for all [facility] Pharmacy numbers 06/08/2021

## 2021-06-08 NOTE — Progress Notes (Signed)
Pharmacy Antibiotic Note  Randall Hodges is a 71 y.o. male admitted on 05/08/2021 with pneumonia.  Pharmacy has been consulted for vancomycin dosing. SCr has remained stable, vancomycin trough therapeutic at 19 mcg/ml.   Plan: Vancomycin 1750mg  IV q24h through 2/22  Height: 5\' 10"  (177.8 cm) Weight: 87.8 kg (193 lb 9 oz) IBW/kg (Calculated) : 73  Temp (24hrs), Avg:98.4 F (36.9 C), Min:98.1 F (36.7 C), Max:98.7 F (37.1 C)  Recent Labs  Lab 06/04/21 1606 06/05/21 0221 06/05/21 1700 06/06/21 0353 06/06/21 1656 06/07/21 0419 06/07/21 1617 06/08/21 0444 06/08/21 0754  WBC 10.5 10.3   < > 10.4 11.5* 9.5 8.5 7.8  --   CREATININE 1.03 1.01   < > 1.01 1.03 1.11 1.02 1.09  --   LATICACIDVEN 0.9 1.1  --  1.6  --  1.3  --  1.3  --   VANCOTROUGH  --   --   --   --   --   --   --   --  19   < > = values in this interval not displayed.    Estimated Creatinine Clearance: 69.4 mL/min (by C-G formula based on SCr of 1.09 mg/dL).    Allergies  Allergen Reactions   Amoxicillin-Pot Clavulanate     Per pt report on 08/19/20, makes his urine dark colored   Atorvastatin Other (See Comments)     ( pt states causing runny nose, headaches, issue w/ urination)   Plant Derived Enzymes     Other reaction(s): Unknown   Trichophyton Other (See Comments)    Arrie Senate, PharmD, BCPS, [facility] Clinical Pharmacist [phone] Please check AMION for all [facility] Pharmacy numbers 06/08/2021

## 2021-06-08 NOTE — Progress Notes (Signed)
Palliative:  HPI: 71 year old male with coronary artery disease who initially presented with chest pain, noted to have multivessel coronary artery disease, he underwent CABG x3 on 1/18/ 2023, course was complicated with atrial fibrillation, frequent hiccups and aspiration pneumonia leading to hypoxia and increasing oxygen requirement. Trach to vent, VV ECMO.   I met today at Randall Hodges's bedside with wife Randall Hodges. Randall Hodges has had a long and complicated road with multiple complications and poor progress so far. Wife Randall Hodges has been by his side throughout this journey. 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 She reports that he is very social and has never met a stranger - something that helped him to excel as a Hotel manager. They have 2 children who live one in [place] and one in [place]. Their daughter is coming into visit tomorrow.   Randall Hodges offers reassurance to Randall Hodges that he is fighting so hard and she knows that he will improve and get better. She makes attempts to help calm him in an episode of agitation. We discussed the complicated ability to provide sedation to keep him calm but allow him to awaken if he is able - I reassured her that this is extremely complicated with patients on ECMO as well as on ventilator support. She understands that what works one day doesn't always work the next. We discussed some of her frustrations with setbacks and wanting to ensure that he is given every opportunity to have progress and improvement as much as possible. She shares about feeling he has setbacks in the weekends without therapy.   I spoke also with Randall Hodges about the importance of self care. She has been making a good effort to take care of herself and although her children are not close by she has a very supportive church family that has been caring for her. She journals and does not stay at the hospital all day long. She understands  that she needs to care for herself to be any good to Randall Hodges. I am sure it will be comforting when her daughter is in town and able to be with her as well. She is tearful at times throughout our conversation and when asked what she really needs she replies "prayer."   All questions/concerns addressed. Emotional support provided.   Exam: Sedated on vent 70% FiO2 with trach and VV ECMO. No distress except one short episode of agitation in which his sats dropped into low 80s.   Plan: - Palliative will continue to check in with family/wife for ongoing support through this journey. Will not follow daily.   [place], NP Palliative Medicine Team Pager [phone] (Please see [email or web address] for schedule) Team Phone [phone]    Greater than 50%  of this time was spent counseling and coordinating care related to the above assessment and plan

## 2021-06-08 NOTE — Progress Notes (Signed)
NAME:  Randall Hodges, MRN:  [id number], DOB:  [birth year], LOS: 83 ADMISSION DATE:  05/17/2021, CONSULTATION DATE: 05/09/2021 REFERRING MD:  Dahlia Byes, MD, CHIEF COMPLAINT: Increasing shortness of breath  History of Present Illness:  71 year old male with coronary artery disease who initially presented with chest pain, noted to have multivessel coronary artery disease, he underwent CABG x3 on 04/1818 23, course was complicated with atrial fibrillation, frequent hiccups and aspiration pneumonia leading to hypoxia and increasing oxygen requirement. PCCM was consulted for evaluation and help with management  Patient stated hiccups are better but continued complain of shortness of breath, cough unable to bring up phlegm.  Pertinent  Medical History  CAD Allergic rhinitis Prostate cancer GERD Pulmonary fibrosis   Significant Hospital Events: Including procedures, antibiotic start and stop dates in addition to other pertinent events   1/18 CABG 1/30 PCCM consult, Worsening O2 needs, likely aspiration, new pressor need 2/2 intubation, placed on VV ECMO 2/5 issues with recirc, bronch neg, improved with supine positioning 2/7 percutaneous tracheostomy 2/10 diuresis and weaning sedation. CT chest shows diffuse ground-glass opacification.  2/11 weaning sedation 2/13 increasing air hunger as oxygenator efficiency decreasing.  2/14 oxygenator changed.  Still having air hunger but able to follow commands and participate with therapy.  2/15 considerable distress with double stacking. Switched to true rest settings - with improvement in distress.  2/16 bronch, NGT placed to LIWS, con't post-pyloric TF 2/17 DCCV overnight for Afib with RVR. Stood at bedside with PT.  Interim History / Subjective:  No events. Agitation and WOB in afternoon leading to increased sedation. Wife's concerns regarding staffing on nights and weekends noted.  Objective   Blood pressure (!) 166/77, pulse 91,  temperature 98.4 F (36.9 C), temperature source Oral, resp. rate (!) 23, height 5\' 10"  (1.778 m), weight 87.8 kg, SpO2 90 %. CVP:  [5 mmHg-10 mmHg] 7 mmHg  Vent Mode: PCV FiO2 (%):  [70 %] 70 % Set Rate:  [10 bmp] 10 bmp PEEP:  [10 cmH20] 10 cmH20 Plateau Pressure:  [20 cmH20-21 cmH20] 20 cmH20   Intake/Output Summary (Last 24 hours) at 06/08/2021 0720 Last data filed at 06/08/2021 0656 Gross per 24 hour  Intake 2837.58 ml  Output 6365 ml  Net -3527.42 ml    Filed Weights   06/06/21 0500 06/07/21 0600 06/08/21 0600  Weight: 92.1 kg 91 kg 87.8 kg   Examination: No distress ECMO cannula with good color change, SvO2 65%, 4.7 LPM, Sweep 6LPM Lung sounds absent TV in 80s, plat 20, 10/10 PC Moves all 4 ext to stimulation Mild global anasarca Abdomen soft, NG with dark green output, cortrak with ongoing TF  ABG alkalotic Sodium up a bit Cr okay AST up slightly Plts 123 CBC benign On vanc CXR stable severe bilateral airspace disease  Ancillary tests personally reviewed:     Assessment & Plan:   Acute hypoxic and hypercapnic respiratory failure with hypoxia ARDS Aspiration pneumonitis- recurrent Hiccups, recurrent aspiration episodes.  ILD, mild- question of chronic aspiration syndrome Agitation, ICU delirium, and air hunger- issue leading to fentanyl need CAD with UA s/p CABG x 3, (LIMA-LAD, SVG-OM, SVG-left PDA) NSVT Afib with RVR, DCCV, back in NSR Hypertension Possible GPC bacteremia vs contaminant- staph epi in 2/4 bottles- on 7 days vanc - Lung protective TV - Sweep wean to pH > 7.35 and/or WOB - NG to LIS, cortrak for PP TF, reglan - VV ECMO and diuresis management per CHF team -con't baclofen, morphine, seroquel, klonipin, melatonin  and gabapentin; wean down fentanyl drip as able - colchicine per TCTS - bowel regimen as ordered, rectal management system - Continue PT/OT efforts -PPI BID -finish out trial of steroids, did not seem to have effect - Overall  needs time to see if will recover -aspirin, statin -holding coreg due to bradycardia -PO amiodarone -hydralazine PRN; can add scheduled if needed  Best Practice (right click and "Reselect all SmartList Selections" daily)   Diet/type: TF DVT prophylaxis: Bival CBG: SSI GI prophylaxis: PPI Lines: L [place] TLC, RIJ  Foley:  Yes Code Status:  full code Last date of multidisciplinary goals of care discussion [ 2/16]    Patient critically ill due to resp failure, delirium Interventions to address this today sedation and vent titration Risk of deterioration without these interventions is high  I personally spent 33 minutes providing critical care not including any separately billable procedures  Erskine Emery MD [facility]  Prefer epic messenger for cross cover needs If after hours, please call E-link

## 2021-06-08 NOTE — Progress Notes (Signed)
0700 - Report received from [place], [place].  All questions answered.  All lines and drips verified.  Safety checks performed. Hand hygiene performed before/after each pt contact. 0700 - Dr. Tamala Julian rounded. 0730 - Dr. Aundra Dubin rounded. 0800 - Assessment, Rx, CBG, and ABG. 5366 - Dr. Darcey Nora rounded. 0900 - Vancomycin not given yet d/t no Vanc Trough result yet.   0931 - Lab called to check on Vanc Trough before Vancomycin administration.  Pt bathed with soap and water; dried; then wiped with CHG wipes. 0945 - Pt tilted to 35 degrees w/three straps securing him to the bed.  Pt's SpO2 increased to 97%. 0956 - Lab called to check on Vanc Trough before Vancomycin administration. 1003 - Vancomycin administered.  Dr. Tamala Julian rounded.   1005 - Pt un-tilted, but placed in reverse Trendelenburg. 1110 - Pt's daughter, Kristin Bruins, called for an update.  All questions answered. 1200 - Pam, pt's wife arrived to visit.  Alicia, Palliative, spoke with Pam. 1215 - Pt became anxious/began coughing.  Pt given bolus of Fentanyl and was able to calm down. 1315 - PT worked with pt.  Pt sat on the side of the bed w/PT working with him for approximately 30 min. 37 - Pt's wife left. 1530 - Pt became agitated.  Pt given Fentanyl bolus.  Pt calmed. 1600 - Pt gesticulating frequently.  Pt asked if he wanted to stand up and he nodded.  Pt placed at a 40 degree tilt from 4403-4742. 1645 - Pt's labs resulted.  K+ was 3.5.  K+ orders placed. 1700 - Ortho tech placed foot drop boot on pt's R foot. 1800 - Pt indicated that he was hurting.  Fentanyl bolused.  Pt coughed and a light brown substance was noted around his trach.  Gauze replaced.  No further distress noted. 1830 - ECMO circuit chugging.  Albumin given. 1855 - Pt became agitated.  Pt given a bolus of Fentanyl.  Pt calmed. 1900 - Report given to Caryl Pina, Therapist, sports.  All questions answered.

## 2021-06-08 NOTE — Progress Notes (Signed)
Patient ID: DAYLEN LIPSKY, male   DOB: Mar 23, 1951, 71 y.o.   MRN: [id number] Extracorporeal support note  ECLS support day: 17 Indication: ARDS  Configuration: VV  Drainage cannula: [place]cent R IJ Return cannula: Crescent R IJ  Pump speed: 3750 rpm Pump flow: 4.8 L/min Pump used: Cardiohelp  Sweep gas: 6   Circuit check: Good color change. LDH stable 227.  Anticoagulant: Bivalirudin, PTT goal 50-80. PTT 61  Changes in support: No changes today.   Anticipated goals/duration of support: Wean to discontinuation  Loralie Champagne, MD  7:32 AM

## 2021-06-08 NOTE — Progress Notes (Signed)
Physical Therapy Treatment Patient Details Name: Randall Hodges MRN: [id number] DOB: [birth year] Today's Date: 06/08/2021   History of Present Illness 71 yo male with CAD who initially presented with CP, noted to have multivessel CAD, he underwent CABG x3 on 05/10/2021, course was complicated with atrial fibrillation, frequent hiccups and aspiration PNA leading to hypoxia and increasing oxygen requirement. ETT 1/18-1/18. 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 Cortrak 1/30. 2/2 placed on VV ECMO. 2/7 trach. 2/14 ECMO circuit replacement. PMH: prostate CA    PT Comments    Pt progressing towards his physical therapy goals; demonstrates improved alertness, activity tolerance, and command following this session. Pt following ~25% of one step commands. Requiring two-three person total assist for bed mobility. Pt sat on edge of bed for ~15 minutes, working on [employer] exercises and static/dynamic sitting balance. Will continue to progress as tolerated.    Recommendations for follow up therapy are one component of a multi-disciplinary discharge planning process, led by the attending physician.  Recommendations may be updated based on patient status, additional functional criteria and insurance authorization.  Follow Up Recommendations  Acute inpatient rehab (3hours/day)     Assistance Recommended at Discharge Frequent or constant Supervision/Assistance  Patient can return home with the following A lot of help with walking and/or transfers;A lot of help with bathing/dressing/bathroom;Two people to help with walking and/or transfers;Two people to help with bathing/dressing/bathroom;Assistance with feeding;Assistance with cooking/housework;Direct supervision/assist for medications management;Direct supervision/assist for financial management;Assist for transportation;Help with stairs or ramp for entrance   Equipment Recommendations  BSC/3in1;Wheelchair (measurements PT);Wheelchair cushion  (measurements PT);Hospital bed;Other (comment) (hoyer lift)    Recommendations for Other Services       Precautions / Restrictions Precautions Precautions: Fall;Sternal Precaution Comments: ECMO, cortrak, foley, aline, sternal precautions, flexiseal, NG tube, trach on vent Restrictions Weight Bearing Restrictions: Yes Other Position/Activity Restrictions: sternal precautions     Mobility  Bed Mobility Overal bed mobility: Needs Assistance Bed Mobility: Supine to Sit, Sit to Supine     Supine to sit: Total assist, +2 for physical assistance Sit to supine: +2 for physical assistance, Total assist   General bed mobility comments: TotalA + 3 for trunk/BLE/line management    Transfers                   General transfer comment: deferred    Ambulation/Gait                   Stairs             Wheelchair Mobility    Modified Rankin (Stroke Patients Only)       Balance Overall balance assessment: Needs assistance Sitting-balance support: Feet supported Sitting balance-Leahy Scale: Zero Sitting balance - Comments: Pt requiring max-totalA for sitting balance, posterior lean                                    Cognition Arousal/Alertness: Awake/alert Behavior During Therapy: Flat affect Overall Cognitive Status: Difficult to assess                                 General Comments: Pt with intermittent response to yes/no questions, following commands ~25% of the time        Exercises General Exercises - Upper Extremity Shoulder Flexion: AAROM, Both, 5 reps, Supine General Exercises - Lower Extremity Long Arc  Quad: AAROM, Both, 5 reps, Seated Heel Slides: PROM, Both, 5 reps, Supine Other Exercises Other Exercises: Supine: cervical stretching to neutral Other Exercises: Sitting: anterior to posterior weight shifting with tactile cueing, scapular retractions, cervical rotation    General Comments  SpO2 83-90% on  70% FiO2, 10 PEEP, Sweep: 6, CVP: 10, HR 72, Pre BP 119/47      Pertinent Vitals/Pain Pain Assessment Pain Assessment: CPOT Facial Expression: Tense Body Movements: Restlessness Muscle Tension: Tense, rigid Compliance with ventilator (intubated pts.): Tolerating ventilator or movement Vocalization (extubated pts.): N/A CPOT Total: 4    Home Living                          Prior Function            PT Goals (current goals can now be found in the care plan section) Acute Rehab PT Goals Patient Stated Goal: pt wife would like him to participate in therapy as much as possible PT Goal Formulation: Patient unable to participate in goal setting Time For Goal Achievement: 06/19/21 Potential to Achieve Goals: Fair Progress towards PT goals: Progressing toward goals    Frequency    Min 3X/week      PT Plan Current plan remains appropriate    Co-evaluation              AM-PAC PT "6 Clicks" Mobility   Outcome Measure  Help needed turning from your back to your side while in a flat bed without using bedrails?: Total Help needed moving from lying on your back to sitting on the side of a flat bed without using bedrails?: Total Help needed moving to and from a bed to a chair (including a wheelchair)?: Total Help needed standing up from a chair using your arms (e.g., wheelchair or bedside chair)?: Total Help needed to walk in hospital room?: Total Help needed climbing 3-5 steps with a railing? : Total 6 Click Score: 6    End of Session Equipment Utilized During Treatment: Oxygen Activity Tolerance: Patient tolerated treatment well Patient left: in bed;with call bell/phone within reach;with nursing/sitter in room Nurse Communication: Mobility status PT Visit Diagnosis: Muscle weakness (generalized) (M62.81);Other abnormalities of gait and mobility (R26.89);Difficulty in walking, not elsewhere classified (R26.2);Unsteadiness on feet (R26.81)     Time:  2353-6144 PT Time Calculation (min) (ACUTE ONLY): 33 min  Charges:  $Therapeutic Exercise: 8-22 mins $Therapeutic Activity: 8-22 mins                     Wyona Almas, PT, DPT Acute Rehabilitation Services Pager [phone] Office [phone]    Deno Etienne 06/08/2021, 4:55 PM

## 2021-06-08 NOTE — Progress Notes (Signed)
ANTICOAGULATION CONSULT NOTE  Pharmacy Consult for Bivalirudin Indication:  ECMO  Allergies  Allergen Reactions   Amoxicillin-Pot Clavulanate     Per pt report on 08/19/20, makes his urine dark colored   Atorvastatin Other (See Comments)     ( pt states causing runny nose, headaches, issue w/ urination)   Plant Derived Enzymes     Other reaction(s): Unknown   Trichophyton Other (See Comments)    Patient Measurements: Height: 5\' 10"  (177.8 cm) Weight: 87.8 kg (193 lb 9 oz) IBW/kg (Calculated) : 73  Vital Signs: Temp: 98.1 F (36.7 C) (02/20 1600) Temp Source: Core (02/20 1600) BP: 147/77 (02/20 1600) Pulse Rate: 78 (02/20 1600)  Labs: Recent Labs    06/05/21 2208 06/06/21 0037 06/06/21 0353 06/06/21 0424 06/06/21 0532 06/06/21 0730 06/07/21 0419 06/07/21 0429 06/07/21 1617 06/07/21 1643 06/08/21 0444 06/08/21 0804 06/08/21 0926 06/08/21 1016 06/08/21 1601 06/08/21 1603  HGB  --   --  11.0*   < >  --    < > 9.9*   < > 9.8*   < > 9.0*   < > 10.2* 10.2*  --  9.2*  HCT  --   --  33.3*   < >  --    < > 30.3*   < > 30.2*   < > 28.5*   < > 30.0* 30.0*  --  27.0*  PLT  --   --  143*  --   --    < > 133*  --  PLATELET CLUMPS NOTED ON SMEAR, UNABLE TO ESTIMATE  --  123*  --   --   --   --   --   APTT  --   --  66*  --   --    < > 57*  --  62*  --  61*  --   --   --  63*  --   LABPROT  --   --  23.2*  --   --   --  24.3*  --   --   --  24.7*  --   --   --   --   --   INR  --   --  2.1*  --   --   --  2.2*  --   --   --  2.2*  --   --   --   --   --   CREATININE 1.04  --  1.01  --   --    < > 1.11  --  1.02  --  1.09  --   --   --  1.08  --   TROPONINIHS 211* 226*  --   --  196*  --   --   --   --   --   --   --   --   --   --   --    < > = values in this interval not displayed.     Estimated Creatinine Clearance: 70 mL/min (by C-G formula based on SCr of 1.08 mg/dL).   Medical History: Past Medical History:  Diagnosis Date   Allergic rhinitis    Prostate cancer  ([place])     Medications:  Infusions:   sodium chloride Stopped (06/01/21 1121)   sodium chloride 10 mL/hr at 06/08/21 1700   sodium chloride Stopped (06/06/21 1924)   albumin human 12.5 g (06/08/21 1704)   albumin human Stopped (06/08/21 0735)   amiodarone 30 mg/hr (  06/08/21 1700)   bivalirudin (ANGIOMAX) infusion 0.5 mg/mL (Non-ACS indications) 0.065 mg/kg/hr (06/08/21 1700)   dexmedetomidine (PRECEDEX) IV infusion Stopped (06/05/21 0655)   feeding supplement (VITAL 1.5 CAL) 1,000 mL (06/08/21 0248)   fentaNYL infusion INTRAVENOUS 25 mcg/hr (06/08/21 1700)   furosemide (LASIX) 200 mg in dextrose 5% 100 mL (2mg /mL) infusion 8 mg/hr (06/08/21 1700)   vancomycin 1,750 mg (06/08/21 1003)    Assessment: 71 year old male with coronary artery disease who initially presented with chest pain, noted to have multivessel coronary artery disease. He underwent CABG x3 on 04/29/2021; course was complicated with atrial fibrillation, frequent hiccups and aspiration pneumonia leading to hypoxia and increasing oxygen requirement. Patient now requiring VV ECMO. Pharmacy consulted for bivalirudin IV.  -aPTT therapeutic at 63 seconds, CBC stable  Goal of Therapy:  aPTT 50-80 seconds Monitor platelets by anticoagulation protocol: Yes   Plan:  Continue bivalirudin at 0.065 mg/kg/hr Monitor q12h aptt and CBC Monitor closely for s/sx bleeding/thrombus  Hildred Laser, PharmD Clinical Pharmacist **Pharmacist phone directory can now be found on [email or web address] ([email or web address]).  Listed under [place].

## 2021-06-08 NOTE — Progress Notes (Signed)
Orthopedic Tech Progress Note Patient Details:  Randall Hodges [birth year] [id number]  Ortho Devices Type of Ortho Device: Prafo boot/shoe Ortho Device/Splint Location: RLE Ortho Device/Splint Interventions: Ordered, Application, Adjustment   Post Interventions Patient Tolerated: Well Instructions Provided: [facility] of [name] 06/08/2021, 5:12 PM

## 2021-06-08 NOTE — Progress Notes (Signed)
Patient ID: RYLEND PIETRZAK, male   DOB: 1950-09-15, 71 y.o.   MRN: [id number]    Progress Note  Patient Name: Randall Hodges Date of Encounter: 06/08/2021  [facility] Cardiologist: [name], MD   Subjective   2/2: VV ECMO initiation, Crescent catheter right IJ 2/7: Tracheostomy 2/13 CT C/A/P: Diffuse ground-glass opacities consistent with atelectasis and edema presumably related to ECMO physiology and respiratory failure.  Underlying moderate interstitial lung disease mildly progressed from prior CT. 2/15: Oxygenator changed, trach changed.  2/16: Transient asystole with cough and adjustment of tracheostomy overnight.  2/17: Atrial flutter with RVR, required emergent DCCV and amiodarone restarted  CXR with severe bilateral diffuse disease, right lung starting to improve.   Remains on vancomycin.  Suspect aspiration again with TFs in trach on 2/16, NGT placed to suction and also has post-pyloric tube for feeds.    Lung protective ventilation with ARDS.  Vent FiO2 0.7. Vt 80 cc.  CVP 10 this morning, on Lasix gtt with excellent diuresis (net negative 3.5 L and weight down).  Creatinine and BUN stable.  No chugging this morning, had some yesterday and got albumin.   Rhythm difficult, ?atrial flutter.  Frequent PVCs/NSVT.    ECMO: Speed 3750 rpm Flow 4.8 L/min pVen -94 DeltaP 25 Sweep 6 PTT 61 (goal 50-80) LDH 227 ABG 7.47/44/66/94% Lactate 1.3   Cardiac Studies: Echo (limited, 1/30): Echo reviewed, there is clear respirophasic variation of the interventricular septum and marked respirophasic variation of E inflow velocity on doppler evaluation of the mitral valve.  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  LV EF 45-50%.   RHC Procedural Findings (on norepinephrine 6): Hemodynamics  (mmHg) RA mean 12 RV 37/12 PA 38/16, mean 27 PCWP mean 11 LV 108/12 AO 96/55 PAPI 1.8 Oxygen saturations: PA 54% AO 94% Cardiac Output (Fick) 5.59  Cardiac Index (Fick) 2.81  PVR 2.8 WU Simultaneous RV/LV tracings were obtained.  Difficult to interpret due to atrial fibrillation.  There was some suggestion of discordance (ventricular interdependence) but not clear.  Inpatient Medications    Scheduled Meds:  aspirin  81 mg Per Tube Daily   baclofen  5 mg Per Tube TID   chlorhexidine gluconate (MEDLINE KIT)  15 mL Mouth Rinse BID   Chlorhexidine Gluconate Cloth  6 each Topical Daily   clonazePAM  1 mg Per Tube BID   colchicine  0.6 mg Per Tube Daily   feeding supplement (PROSource TF)  90 mL Per Tube BID   fiber  1 packet Per Tube TID   free water  200 mL Per Tube Q4H   gabapentin  300 mg Per Tube QHS   Gerhardt's butt cream   Topical TID   guaiFENesin  30 mL Per Tube QID   insulin aspart  0-15 Units Subcutaneous Q4H   mouth rinse  15 mL Mouth Rinse 10 times per day   melatonin  5 mg Per Tube QHS   methylPREDNISolone (SOLU-MEDROL) injection  20 mg Intravenous Q24H   metoCLOPramide (REGLAN) injection  5 mg Intravenous Q8H   morphine  30 mg Per Tube Q6H   pantoprazole sodium  40 mg Per Tube BID   QUEtiapine  25 mg Per Tube QHS   rosuvastatin  10 mg Per Tube Daily   sodium chloride flush  10-40 mL Intracatheter Q12H   sodium chloride flush  10-40  mL Intracatheter Q12H   sodium chloride flush  3 mL Intravenous Q12H   Continuous Infusions:  sodium chloride Stopped (06/01/21 1121)   sodium chloride 10 mL/hr at 06/08/21 0500   sodium chloride Stopped (06/06/21 1924)   albumin human Stopped (06/06/21 5625)   albumin human 12.5 g (06/07/21 2241)   amiodarone     amiodarone     bivalirudin (ANGIOMAX) infusion 0.5 mg/mL (Non-ACS indications) 0.065 mg/kg/hr (06/08/21 0500)   dexmedetomidine (PRECEDEX) IV infusion Stopped (06/05/21 0655)   feeding supplement (VITAL 1.5 CAL)  1,000 mL (06/08/21 0248)   fentaNYL infusion INTRAVENOUS 50 mcg/hr (06/08/21 0500)   furosemide (LASIX) 200 mg in dextrose 5% 100 mL (9m/mL) infusion 8 mg/hr (06/08/21 0500)   vancomycin 1,750 mg (06/07/21 0914)   PRN Meds: Place/Maintain arterial line **AND** sodium chloride, sodium chloride, sodium chloride, acetaminophen (TYLENOL) oral liquid 160 mg/5 mL, albumin human, albumin human, docusate, fentaNYL, fentaNYL (SUBLIMAZE) injection, fentaNYL (SUBLIMAZE) injection, hydrALAZINE, levalbuterol, lip balm, ondansetron (ZOFRAN) IV, polyvinyl alcohol, sennosides, sodium chloride flush, white petrolatum   Vital Signs    Vitals:   06/08/21 0200 06/08/21 0300 06/08/21 0500 06/08/21 0600  BP: 119/69  (!) 141/69 (!) 166/77  Pulse: 81  83 91  Resp: (!) 0  (!) 0 (!) 23  Temp:      TempSrc:      SpO2: 90% 90% 91% 90%  Weight:    87.8 kg  Height:        Intake/Output Summary (Last 24 hours) at 06/08/2021 0734 Last data filed at 06/08/2021 0656 Gross per 24 hour  Intake 2837.58 ml  Output 6365 ml  Net -3527.42 ml   Last 3 Weights 06/08/2021 06/07/2021 06/06/2021  Weight (lbs) 193 lb 9 oz 200 lb 9.9 oz 203 lb 0.7 oz  Weight (kg) 87.8 kg 91 kg 92.1 kg      Telemetry    ?atrial flutter with PVCs/NSVT (personally reviewed)  Physical Exam   General: NAD Neck: RIJ Crescent catheter, no thyromegaly or thyroid nodule.  Lungs: Decreased bilaterally.  CV: Nondisplaced PMI.  Heart irregular S1/S2, no S3/S4, no murmur.  2+ edema to knees Abdomen: Soft, nontender, no hepatosplenomegaly, no distention.  Skin: Intact without lesions or rashes.  Neurologic: Will awaken and follow commands.  Extremities: No clubbing or cyanosis.  HEENT: Normal.   Labs    High Sensitivity Troponin:   Recent Labs  Lab 06/05/21 2208 06/06/21 0037 06/06/21 0532  TROPONINIHS 211* 226* 196*      Chemistry Recent Labs  Lab 06/02/21 0345 06/02/21 0539 06/05/21 2208 06/06/21 0353 06/06/21 0424  06/07/21 0419 06/07/21 0429 06/07/21 1617 06/07/21 1643 06/07/21 2319 06/08/21 0441 06/08/21 0444  NA 151*   < > 144 144   < > 143   < > 143   < > 144 146* 146*  K 4.3   < > 3.4* 3.8   < > 4.0   < > 3.6   < > 4.5 4.1 4.2  CL 115*   < > 110 107   < > 105  --  104  --   --   --  105  CO2 30   < > 25 27   < > 29  --  30  --   --   --  31  GLUCOSE 148*   < > 175* 152*   < > 155*  --  142*  --   --   --  167*  BUN 85*   < >  58* 56*   < > 55*  --  55*  --   --   --  56*  CREATININE 1.25*   < > 1.04 1.01   < > 1.11  --  1.02  --   --   --  1.09  CALCIUM 7.9*   < > 7.8* 8.0*   < > 7.7*  --  7.9*  --   --   --  8.0*  MG 2.4  --  2.0  --   --   --   --   --   --   --   --   --   PROT 4.2*   < >  --  4.9*  --  4.5*  --   --   --   --   --  4.9*  ALBUMIN 2.6*   < >  --  2.8*  --  2.8*  --   --   --   --   --  2.9*  AST 98*   < >  --  30  --  40  --   --   --   --   --  58*  ALT 161*   < >  --  76*  --  73*  --   --   --   --   --  90*  ALKPHOS 137*   < >  --  90  --  89  --   --   --   --   --  97  BILITOT 1.0   < >  --  0.6  --  0.7  --   --   --   --   --  0.5  GFRNONAA >60   < > >60 >60   < > >60  --  >60  --   --   --  >60  ANIONGAP 6   < > 9 10   < > 9  --  9  --   --   --  10   < > = values in this interval not displayed.    Lipids  No results for input(s): CHOL, TRIG, HDL, LABVLDL, LDLCALC, CHOLHDL in the last 168 hours.   Hematology Recent Labs  Lab 06/07/21 0419 06/07/21 0429 06/07/21 1617 06/07/21 1643 06/07/21 2319 06/08/21 0441 06/08/21 0444  WBC 9.5  --  8.5  --   --   --  7.8  RBC 3.33*  --  3.22*  --   --   --  3.03*  HGB 9.9*   < > 9.8*   < > 8.8* 9.9* 9.0*  HCT 30.3*   < > 30.2*   < > 26.0* 29.0* 28.5*  MCV 91.0  --  93.8  --   --   --  94.1  MCH 29.7  --  30.4  --   --   --  29.7  MCHC 32.7  --  32.5  --   --   --  31.6  RDW 17.5*  --  17.5*  --   --   --  17.4*  PLT 133*  --  PLATELET CLUMPS NOTED ON SMEAR, UNABLE TO ESTIMATE  --   --   --  123*   < > = values  in this interval not displayed.   Thyroid No results for input(s): TSH, FREET4 in the last 168 hours.  BNPNo results for input(s): BNP, PROBNP in the last 168 hours.  DDimer No results for input(s): DDIMER in the last 168 hours.   Radiology    DG CHEST PORT 1 VIEW  Result Date: 06/07/2021 CLINICAL DATA:  Acute coronary syndrome EXAM: PORTABLE CHEST 1 VIEW COMPARISON:  Radiograph 06/06/2021 FINDINGS: Tracheostomy tube overlies the midthoracic trachea. Unchanged ECMO cannula. There are 2 enteric tubes, 1 of which passes below the diaphragm, tip excluded by collimation. The other tube appears to a been retracted with tip near the GE junction. Unchanged right upper extremity PICC tip overlying the right atrium. Unchanged obscured cardiomediastinal silhouette. Persistent diffuse airspace disease and bilateral pleural effusions not significantly changed from prior. Intact sternotomy wires. Unchanged right second rib fracture. IMPRESSION: Persistent diffuse airspace disease and bilateral pleural effusions, not significantly changed from prior exam. There are 2 enteric tubes, 1 of which passes below the diaphragm with tip excluded by collimation, and the other with tip overlying the gastroesophageal junction. Electronically Signed   By: Maurine Simmering M.D.   On: 06/07/2021 08:46    Cardiac Studies   Cath 04/28/2021 Distal left main [place] 111 bifurcation stenosis with 75% left main, 90% ostial to proximal LAD, and 80-90% ostial circumflex (difficult to assess due to heavy calcification). Severe mid circumflex disease with 70% eccentric mid stenosis and second obtuse marginal containing ostial to proximal greater than 80% stenosis.  (Bifurcation [place] 111 Severe calcification in left main and LAD in particular with diffuse 50% narrowing from proximal to mid vessel and tandem 70% stenoses in the mid LAD. Nondominant right coronary Normal LV function.  EF 55%.  LVEDP normal.    Patient Profile     71 y.o.  male with PMH of PVCs presented with chest pain. Cardiac cath by Dr. Tamala Julian on 05/09/2021 showed 75% left main, 90% ost to prox LAD, 80-90% ost LCx, 70% mid LCx, 80% OM2, 50% prox to mid LAD, 70% mid LAD, EF 55%. Patient underwent CABG x 3 on 04/29/2021. Post op course complicated afib, treated with amio. CXR showed opacity in bilateral lung, started abx on 1/25 and diuretic. Started on Eliquis due to recurrence of afib.    Assessment & Plan    1. CAD: Admitted with unstable angina, cath with severe left main and proximal LAD/LCx disease (nondominant RCA).  CABG x 3 on 1/18 with LIMA-LAD, SVG-OM, SVG-left PDA.  No s/s angina  - Continue ASA 81, statin.  2. 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 RHC 1/30 with equalization of diastolic pressures.  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  Repeated echo 2/2 still showed respirophasic septal variation but not as impressive. CVP 10 today, has extensive edema.  Lungs still with diffuse bilateral infiltrates on CXR but with some improvement in right lung.  Diuresed well with IV Lasix gtt yesterday, BUN/creatinine stable. Weight coming down.  - Continue Lasix gtt 8 mg/hr, can decrease with excessive chugging.  - With concern for development of post-surgical effusive/constrictive pericarditis, he is on colchicine.  - ?need for surgical intervention on effusive/constrictive pericarditis. Will need improvement of lung disease first then reassess, repeat echo not as impressive.   3. Shock: In setting of suspected effusive/constrictive pericarditis  but also PNA.  Suspect primarily septic/vasodilatory shock. He  is now off pressors.  4. Hiccups: Intractable initially, now improved.  - On baclofen and gabapentin.  5. PNA with acute hypoxic respiratory failure: Of note, he does appear to have had some pre-existing ILD from 2019 CT chest (?sarcoidosis). CXR with persistent bilateral infiltrates, possible mild improvement compared to yesterday.  Have thought most likely aspiration PNA/pneumonitis in setting of intractable hiccups. He has developed ARDS. COVID was negative.  FiO2 0.7 on vent.  CXR with bilateral diffuse airspace disease, some improvement right lung. Has tracheostomy. CT chest 2/9 with persistent lung infiltrates.  Completed vancomycin and meropenem => MRSE 1/2 blood and trach aspirate, now back on vancomycin.  Suspect ongoing aspiration with TFs noted in trach on 2/16, now with NGT to suction and post-pyloric feeding tube.  LDH stable today. Stable ECMO circuit.  Lung protective ventilation.  - Vent and sedation per CCM.  - Solumedrol 20 mg IV daily with gradual taper, ESR now normal.  - Back on amiodarone but suspect not amiodarone lung toxicity.   - Complete 7 days vancomycin.  6. Atypical atrial flutter/PVCs:  DCCV 2/17.  Suspect atrial flutter again, also frequent NSVT.  - Transition back to IV amiodarone.    - Bivalirudin gtt, goal PTT 50-80.  7. AKI: Creatinine stable 1.11.  Follow closely.  8. Elevated LFTs: Suspect shock liver, follow CMET.  LFTs trending down.  9. Anemia: Transfuse hgb < 8.     10. Hypernatremia: Na 146 today.  - Continue free water 200 q4.  11. HTN: BP rises with sedation wean.  - prn hydralazine for now.  12. Bradycardia/asystole: Vagally mediated with cough.  Resolves rapidly with resolution of cough.  - Atropine at bedside.  13. FEN: TFs restarted with post-pyloric tube and also NGT to suction to prevent aspiration.   - Continue Reglan.   14. Mobilize as much as possible with PT.  Wean Fentanyl  today.   CRITICAL CARE Performed by: Loralie Champagne  Total critical care time: 45 minutes  Critical care time was exclusive of separately billable procedures and treating other patients.  Critical care was necessary to treat or prevent imminent or life-threatening deterioration.  Critical care was time spent personally by me on the following activities: development of treatment plan with patient and/or surrogate as well as nursing, discussions with consultants, evaluation of patient's response to treatment, examination of patient, obtaining history from patient or surrogate, ordering and performing treatments and interventions, ordering and review of laboratory studies, ordering and review of radiographic studies, pulse oximetry and re-evaluation of patient's condition.  Loralie Champagne MD 06/08/2021 7:34 AM

## 2021-06-09 ENCOUNTER — Inpatient Hospital Stay (HOSPITAL_COMMUNITY): Payer: PPO

## 2021-06-09 DIAGNOSIS — J9601 Acute respiratory failure with hypoxia: Secondary | ICD-10-CM | POA: Diagnosis not present

## 2021-06-09 DIAGNOSIS — I249 Acute ischemic heart disease, unspecified: Secondary | ICD-10-CM | POA: Diagnosis not present

## 2021-06-09 LAB — CBC
HCT: 28.6 % — ABNORMAL LOW (ref 39.0–52.0)
HCT: 30.1 % — ABNORMAL LOW (ref 39.0–52.0)
Hemoglobin: 8.9 g/dL — ABNORMAL LOW (ref 13.0–17.0)
Hemoglobin: 9.5 g/dL — ABNORMAL LOW (ref 13.0–17.0)
MCH: 29.6 pg (ref 26.0–34.0)
MCH: 30.3 pg (ref 26.0–34.0)
MCHC: 31.1 g/dL (ref 30.0–36.0)
MCHC: 31.6 g/dL (ref 30.0–36.0)
MCV: 95 fL (ref 80.0–100.0)
MCV: 95.9 fL (ref 80.0–100.0)
Platelets: 104 10*3/uL — ABNORMAL LOW (ref 150–400)
Platelets: 116 10*3/uL — ABNORMAL LOW (ref 150–400)
RBC: 3.01 MIL/uL — ABNORMAL LOW (ref 4.22–5.81)
RBC: 3.14 MIL/uL — ABNORMAL LOW (ref 4.22–5.81)
RDW: 18 % — ABNORMAL HIGH (ref 11.5–15.5)
RDW: 18.3 % — ABNORMAL HIGH (ref 11.5–15.5)
WBC: 9.3 10*3/uL (ref 4.0–10.5)
WBC: 12.2 10*3/uL — ABNORMAL HIGH (ref 4.0–10.5)
nRBC: 0 % (ref 0.0–0.2)
nRBC: 0.2 % (ref 0.0–0.2)

## 2021-06-09 LAB — POCT I-STAT 7, (LYTES, BLD GAS, ICA,H+H)
Acid-Base Excess: 10 mmol/L — ABNORMAL HIGH (ref 0.0–2.0)
Acid-Base Excess: 10 mmol/L — ABNORMAL HIGH (ref 0.0–2.0)
Acid-Base Excess: 10 mmol/L — ABNORMAL HIGH (ref 0.0–2.0)
Acid-Base Excess: 10 mmol/L — ABNORMAL HIGH (ref 0.0–2.0)
Acid-Base Excess: 11 mmol/L — ABNORMAL HIGH (ref 0.0–2.0)
Acid-Base Excess: 7 mmol/L — ABNORMAL HIGH (ref 0.0–2.0)
Acid-Base Excess: 9 mmol/L — ABNORMAL HIGH (ref 0.0–2.0)
Acid-Base Excess: 9 mmol/L — ABNORMAL HIGH (ref 0.0–2.0)
Bicarbonate: 34.3 mmol/L — ABNORMAL HIGH (ref 20.0–28.0)
Bicarbonate: 34.5 mmol/L — ABNORMAL HIGH (ref 20.0–28.0)
Bicarbonate: 34.9 mmol/L — ABNORMAL HIGH (ref 20.0–28.0)
Bicarbonate: 35 mmol/L — ABNORMAL HIGH (ref 20.0–28.0)
Bicarbonate: 35.5 mmol/L — ABNORMAL HIGH (ref 20.0–28.0)
Bicarbonate: 36.4 mmol/L — ABNORMAL HIGH (ref 20.0–28.0)
Bicarbonate: 36.7 mmol/L — ABNORMAL HIGH (ref 20.0–28.0)
Bicarbonate: 36.9 mmol/L — ABNORMAL HIGH (ref 20.0–28.0)
Calcium, Ion: 1.17 mmol/L (ref 1.15–1.40)
Calcium, Ion: 1.18 mmol/L (ref 1.15–1.40)
Calcium, Ion: 1.19 mmol/L (ref 1.15–1.40)
Calcium, Ion: 1.2 mmol/L (ref 1.15–1.40)
Calcium, Ion: 1.21 mmol/L (ref 1.15–1.40)
Calcium, Ion: 1.21 mmol/L (ref 1.15–1.40)
Calcium, Ion: 1.21 mmol/L (ref 1.15–1.40)
Calcium, Ion: 1.21 mmol/L (ref 1.15–1.40)
HCT: 26 % — ABNORMAL LOW (ref 39.0–52.0)
HCT: 27 % — ABNORMAL LOW (ref 39.0–52.0)
HCT: 28 % — ABNORMAL LOW (ref 39.0–52.0)
HCT: 28 % — ABNORMAL LOW (ref 39.0–52.0)
HCT: 29 % — ABNORMAL LOW (ref 39.0–52.0)
HCT: 29 % — ABNORMAL LOW (ref 39.0–52.0)
HCT: 31 % — ABNORMAL LOW (ref 39.0–52.0)
HCT: 33 % — ABNORMAL LOW (ref 39.0–52.0)
Hemoglobin: 10.5 g/dL — ABNORMAL LOW (ref 13.0–17.0)
Hemoglobin: 11.2 g/dL — ABNORMAL LOW (ref 13.0–17.0)
Hemoglobin: 8.8 g/dL — ABNORMAL LOW (ref 13.0–17.0)
Hemoglobin: 9.2 g/dL — ABNORMAL LOW (ref 13.0–17.0)
Hemoglobin: 9.5 g/dL — ABNORMAL LOW (ref 13.0–17.0)
Hemoglobin: 9.5 g/dL — ABNORMAL LOW (ref 13.0–17.0)
Hemoglobin: 9.9 g/dL — ABNORMAL LOW (ref 13.0–17.0)
Hemoglobin: 9.9 g/dL — ABNORMAL LOW (ref 13.0–17.0)
O2 Saturation: 90 %
O2 Saturation: 91 %
O2 Saturation: 92 %
O2 Saturation: 92 %
O2 Saturation: 93 %
O2 Saturation: 94 %
O2 Saturation: 95 %
O2 Saturation: 99 %
Patient temperature: 36.6
Patient temperature: 36.7
Patient temperature: 36.7
Patient temperature: 36.7
Patient temperature: 36.7
Patient temperature: 36.8
Patient temperature: 36.8
Patient temperature: 36.9
Potassium: 3.9 mmol/L (ref 3.5–5.1)
Potassium: 4 mmol/L (ref 3.5–5.1)
Potassium: 4 mmol/L (ref 3.5–5.1)
Potassium: 4.1 mmol/L (ref 3.5–5.1)
Potassium: 4.2 mmol/L (ref 3.5–5.1)
Potassium: 4.3 mmol/L (ref 3.5–5.1)
Potassium: 4.7 mmol/L (ref 3.5–5.1)
Potassium: 4.8 mmol/L (ref 3.5–5.1)
Sodium: 145 mmol/L (ref 135–145)
Sodium: 145 mmol/L (ref 135–145)
Sodium: 146 mmol/L — ABNORMAL HIGH (ref 135–145)
Sodium: 146 mmol/L — ABNORMAL HIGH (ref 135–145)
Sodium: 146 mmol/L — ABNORMAL HIGH (ref 135–145)
Sodium: 146 mmol/L — ABNORMAL HIGH (ref 135–145)
Sodium: 146 mmol/L — ABNORMAL HIGH (ref 135–145)
Sodium: 146 mmol/L — ABNORMAL HIGH (ref 135–145)
TCO2: 36 mmol/L — ABNORMAL HIGH (ref 22–32)
TCO2: 36 mmol/L — ABNORMAL HIGH (ref 22–32)
TCO2: 36 mmol/L — ABNORMAL HIGH (ref 22–32)
TCO2: 37 mmol/L — ABNORMAL HIGH (ref 22–32)
TCO2: 37 mmol/L — ABNORMAL HIGH (ref 22–32)
TCO2: 38 mmol/L — ABNORMAL HIGH (ref 22–32)
TCO2: 38 mmol/L — ABNORMAL HIGH (ref 22–32)
TCO2: 39 mmol/L — ABNORMAL HIGH (ref 22–32)
pCO2 arterial: 45.1 mmHg (ref 32–48)
pCO2 arterial: 45.2 mmHg (ref 32–48)
pCO2 arterial: 53 mmHg — ABNORMAL HIGH (ref 32–48)
pCO2 arterial: 53.6 mmHg — ABNORMAL HIGH (ref 32–48)
pCO2 arterial: 53.9 mmHg — ABNORMAL HIGH (ref 32–48)
pCO2 arterial: 61.2 mmHg — ABNORMAL HIGH (ref 32–48)
pCO2 arterial: 61.9 mmHg — ABNORMAL HIGH (ref 32–48)
pCO2 arterial: 62.3 mmHg — ABNORMAL HIGH (ref 32–48)
pH, Arterial: 7.353 (ref 7.35–7.45)
pH, Arterial: 7.378 (ref 7.35–7.45)
pH, Arterial: 7.38 (ref 7.35–7.45)
pH, Arterial: 7.422 (ref 7.35–7.45)
pH, Arterial: 7.434 (ref 7.35–7.45)
pH, Arterial: 7.441 (ref 7.35–7.45)
pH, Arterial: 7.488 — ABNORMAL HIGH (ref 7.35–7.45)
pH, Arterial: 7.494 — ABNORMAL HIGH (ref 7.35–7.45)
pO2, Arterial: 121 mmHg — ABNORMAL HIGH (ref 83–108)
pO2, Arterial: 54 mmHg — ABNORMAL LOW (ref 83–108)
pO2, Arterial: 60 mmHg — ABNORMAL LOW (ref 83–108)
pO2, Arterial: 60 mmHg — ABNORMAL LOW (ref 83–108)
pO2, Arterial: 67 mmHg — ABNORMAL LOW (ref 83–108)
pO2, Arterial: 67 mmHg — ABNORMAL LOW (ref 83–108)
pO2, Arterial: 71 mmHg — ABNORMAL LOW (ref 83–108)
pO2, Arterial: 73 mmHg — ABNORMAL LOW (ref 83–108)

## 2021-06-09 LAB — GLUCOSE, CAPILLARY
Glucose-Capillary: 128 mg/dL — ABNORMAL HIGH (ref 70–99)
Glucose-Capillary: 132 mg/dL — ABNORMAL HIGH (ref 70–99)
Glucose-Capillary: 136 mg/dL — ABNORMAL HIGH (ref 70–99)
Glucose-Capillary: 140 mg/dL — ABNORMAL HIGH (ref 70–99)
Glucose-Capillary: 156 mg/dL — ABNORMAL HIGH (ref 70–99)
Glucose-Capillary: 163 mg/dL — ABNORMAL HIGH (ref 70–99)

## 2021-06-09 LAB — BASIC METABOLIC PANEL
Anion gap: 9 (ref 5–15)
Anion gap: 6 (ref 5–15)
BUN: 52 mg/dL — ABNORMAL HIGH (ref 8–23)
BUN: 51 mg/dL — ABNORMAL HIGH (ref 8–23)
CO2: 33 mmol/L — ABNORMAL HIGH (ref 22–32)
CO2: 36 mmol/L — ABNORMAL HIGH (ref 22–32)
Calcium: 8.3 mg/dL — ABNORMAL LOW (ref 8.9–10.3)
Calcium: 8.2 mg/dL — ABNORMAL LOW (ref 8.9–10.3)
Chloride: 104 mmol/L (ref 98–111)
Chloride: 105 mmol/L (ref 98–111)
Creatinine, Ser: 1.1 mg/dL (ref 0.61–1.24)
Creatinine, Ser: 1.04 mg/dL (ref 0.61–1.24)
GFR, Estimated: >60 mL/min (ref >60)
GFR, Estimated: >60 mL/min (ref >60)
Glucose, Bld: 138 mg/dL — ABNORMAL HIGH (ref 70–99)
Glucose, Bld: 152 mg/dL — ABNORMAL HIGH (ref 70–99)
Potassium: 4.1 mmol/L (ref 3.5–5.1)
Potassium: 3.9 mmol/L (ref 3.5–5.1)
Sodium: 146 mmol/L — ABNORMAL HIGH (ref 135–145)
Sodium: 147 mmol/L — ABNORMAL HIGH (ref 135–145)

## 2021-06-09 LAB — HEPATIC FUNCTION PANEL
ALT: 77 U/L — ABNORMAL HIGH (ref 0–44)
AST: 39 U/L (ref 15–41)
Albumin: 3.1 g/dL — ABNORMAL LOW (ref 3.5–5.0)
Alkaline Phosphatase: 82 U/L (ref 38–126)
Bilirubin, Direct: 0.2 mg/dL (ref 0.0–0.2)
Indirect Bilirubin: 0.3 mg/dL (ref 0.3–0.9)
Total Bilirubin: 0.5 mg/dL (ref 0.3–1.2)
Total Protein: 4.8 g/dL — ABNORMAL LOW (ref 6.5–8.1)

## 2021-06-09 LAB — FIBRINOGEN: Fibrinogen: 310 mg/dL (ref 210–475)

## 2021-06-09 LAB — APTT
aPTT: 54 seconds — ABNORMAL HIGH (ref 24–36)
aPTT: 58 seconds — ABNORMAL HIGH (ref 24–36)

## 2021-06-09 LAB — PROTIME-INR
INR: 2.2 — ABNORMAL HIGH (ref 0.8–1.2)
Prothrombin Time: 24.8 seconds — ABNORMAL HIGH (ref 11.4–15.2)

## 2021-06-09 LAB — LACTIC ACID, PLASMA: Lactic Acid, Venous: 1.4 mmol/L (ref 0.5–1.9)

## 2021-06-09 LAB — LACTATE DEHYDROGENASE: LDH: 202 U/L — ABNORMAL HIGH (ref 98–192)

## 2021-06-09 MED ORDER — POTASSIUM CHLORIDE 20 MEQ PO PACK
40.0000 meq | PACK | Freq: Two times a day (BID) | ORAL | Status: DC
  Administered 2021-06-09 – 2021-06-16 (×14): 40 meq
  Filled 2021-06-09 (×18): qty 2

## 2021-06-09 MED ORDER — GUAIFENESIN 100 MG/5ML PO LIQD
30.0000 mL | Freq: Four times a day (QID) | ORAL | Status: DC | PRN
  Filled 2021-06-09: qty 30

## 2021-06-09 NOTE — Progress Notes (Signed)
Orthopedic Tech Progress Note Patient Details:  Randall Hodges 1950-11-30 [id number]  Ortho Devices Type of Ortho Device: Haematologist Ortho Device/Splint Location: BLE Ortho Device/Splint Interventions: Ordered, Application, Adjustment   Post Interventions Patient Tolerated: Well Instructions Provided: Care of device  [name] 06/09/2021, 9:36 AM

## 2021-06-09 NOTE — Progress Notes (Signed)
Patient ID: KATHERINE SYME, male   DOB: 1950-10-04, 71 y.o.   MRN: [id number] Extracorporeal support note  ECLS support day: 18 Indication: ARDS  Configuration: VV  Drainage cannula: Crescent R IJ Return cannula: Crescent R IJ  Pump speed: 3700 rpm Pump flow: 4.95 L/min Pump used: Cardiohelp  Sweep gas: 6.5   Circuit check: Good color change. LDH stable 202.  Anticoagulant: Bivalirudin, PTT goal 50-80. PTT 54  Changes in support: No changes today.   Anticipated goals/duration of support: Wean to discontinuation  Loralie Champagne, MD  7:47 AM

## 2021-06-09 NOTE — Progress Notes (Addendum)
ANTICOAGULATION CONSULT NOTE  Pharmacy Consult for Bivalirudin Indication:  ECMO  Allergies  Allergen Reactions   Amoxicillin-Pot Clavulanate     Per pt report on 08/19/20, makes his urine dark colored   Atorvastatin Other (See Comments)     ( pt states causing runny nose, headaches, issue w/ urination)   Plant Derived Enzymes     Other reaction(s): Unknown   Trichophyton Other (See Comments)    Patient Measurements: Height: 5\' 10"  (177.8 cm) Weight: 88.9 kg (195 lb 15.8 oz) IBW/kg (Calculated) : 73  Vital Signs: Temp: 98.1 F (36.7 C) (02/21 1200) Temp Source: Core (02/21 1200) BP: 129/70 (02/21 1500) Pulse Rate: 88 (02/21 1500)  Labs: Recent Labs    06/07/21 0419 06/07/21 0429 06/08/21 0444 06/08/21 0804 06/08/21 1601 06/08/21 1603 06/09/21 0407 06/09/21 0410 06/09/21 0740 06/09/21 1138 06/09/21 1615 06/09/21 1617  HGB 9.9*   < > 9.0*   < > 9.3*   < > 8.9*   < > 9.5* 9.2* 9.5*  --   HCT 30.3*   < > 28.5*   < > 29.5*   < > 28.6*   < > 28.0* 27.0* 28.0*  --   PLT 133*   < > 123*  --  114*  --  104*  --   --   --   --   --   APTT 57*   < > 61*  --  63*  --  54*  --   --   --   --  58*  LABPROT 24.3*  --  24.7*  --   --   --  24.8*  --   --   --   --   --   INR 2.2*  --  2.2*  --   --   --  2.2*  --   --   --   --   --   CREATININE 1.11   < > 1.09  --  1.08  --  1.10  --   --   --   --   --    < > = values in this interval not displayed.     Estimated Creatinine Clearance: 69.2 mL/min (by C-G formula based on SCr of 1.1 mg/dL).   Medical History: Past Medical History:  Diagnosis Date   Allergic rhinitis    [place]state cancer ([place])     Medications:  Infusions:   sodium chloride Stopped (06/01/21 1121)   sodium chloride Stopped (06/09/21 0809)   sodium chloride Stopped (06/06/21 1924)   albumin human 12.5 g (06/08/21 1704)   albumin human 12.5 g (06/08/21 1820)   amiodarone 30 mg/hr (06/09/21 1621)   bivalirudin (ANGIOMAX) infusion 0.5 mg/mL (Non-ACS  indications) 0.065 mg/kg/hr (06/09/21 1500)   dexmedetomidine (PRECEDEX) IV infusion Stopped (06/05/21 0655)   feeding supplement (VITAL 1.5 CAL) 1,000 mL (06/09/21 1615)   fentaNYL infusion INTRAVENOUS 25 mcg/hr (06/09/21 1500)   furosemide (LASIX) 200 mg in dextrose 5% 100 mL (2mg /mL) infusion 8 mg/hr (06/09/21 1500)   vancomycin Stopped (06/09/21 1009)    Assessment: 71 year old male with CAD who initially presented with chest pain, noted to have multivessel coronary artery disease. He underwent CABG x3 on 05/16/2021; course was complicated with atrial fibrillation, frequent hiccups and aspiration pneumonia leading to hypoxia and increasing oxygen requirement. Patient now requiring VV ECMO. Pharmacy consulted for bivalirudin IV.  aPTT remains therapeutic at 58 seconds, CBC stable, pltc remains low but stable.  RN reports some oozing from  IV line, A line and cannulation site; however, temporizing measures in place and patient is no longer oozing.  Goal of Therapy:  aPTT 50-80 seconds Monitor platelets by anticoagulation protocol: Yes   Plan:  Continue bivalirudin at 0.065 mg/kg/hr Monitor q12h aptt and CBC Monitor closely for s/sx bleeding/thrombus  Ajahnae Rathgeber D. Mina Marble, PharmD, BCPS, [place] 06/09/2021, 5:27 PM

## 2021-06-09 NOTE — Progress Notes (Signed)
Nutrition Follow-up  DOCUMENTATION CODES:   Not applicable  INTERVENTION:   Tube Feeding via Cortrak (post-pyloric):  Vital 1.5 at 60 ml/h (1440 ml per day) Pro-source TF to 90 ml BID Provides 2320 kcal, 141 gm protein, 1094 ml free water daily   Total free water with flushes and TF: 2294 mL of free water  NUTRITION DIAGNOSIS:   Inadequate oral intake related to inability to eat as evidenced by NPO status.  Being addressed via TF   GOAL:   Patient will meet greater than or equal to 90% of their needs  Progressing  MONITOR:   TF tolerance, Diet advancement, Labs, Weight trends  REASON FOR ASSESSMENT:   Rounds    ASSESSMENT:   71 yo male admitted with acute coronary syndrome, underwent CABG x 3 on 1/18; developed intractable hiccups/muscle spasms, aspiration pneumonia with respiratory failure. PMH includes prostate cancer  1/18 CABG x 3 1/29 Transferred back to ICU due to hypotension, worsening respiratory status 1/30 Cortrak placed-tip near LOT, TF initiated 2/02 Intubated, VV ECMO cannulation for ARDS 2/07 Trach placed 2/16 Bronch, NG placed for decompression  Pt remains on vent support via trach; VV ECMO day 18  Cortrak and NG both in left nare. Cortrak is over the proximal jejunum, NG in stomach for decompression. Minimal output from NG today, bilious appearance.   Tolerating Vital 1.5 at 60 ml/hr via post pyloric Cortrak Free water decreased to 200 mL q 4 hours  Rectal tube in place; about 150-200 mL in 24 hours  RN skin assessment reviewed; no new skin breakdown. Backside looks good per RN  Weight has fluctuated since admission; current wt 89 kg  Labs: sodium 146 (H), Creatinine wdl, BUN 52 Meds: lasix gtt, ss novolog, solumedrol, reglan, Kcl  Diet Order:   Diet Order             Diet NPO time specified  Diet effective midnight                   EDUCATION NEEDS:   Education needs have been addressed  Skin:  Skin Assessment: Skin  Integrity Issues: Skin Integrity Issues:: Stage II Stage II: nose d/t BiPap mask Incisions: sternum, leg, groin  Last BM:  2/21 rectal tube  Height:   Ht Readings from Last 1 Encounters:  06/01/21 5\' 10"  (1.778 m)    Weight:   Wt Readings from Last 1 Encounters:  06/09/21 88.9 kg    BMI:  Body mass index is 28.12 kg/m.  Estimated Nutritional Needs:   Kcal:  2200-2500 kcals  Protein:  125-150 g  Fluid:  1.8 L   Kerman Passey MS, RDN, LDN, CNSC Registered Dietitian III Clinical Nutrition RD Pager and On-Call Pager Number Located in [place]

## 2021-06-09 NOTE — Progress Notes (Signed)
NAME:  Randall Hodges, MRN:  [id number], DOB:  01-20-1951, LOS: 22 ADMISSION DATE:  04/19/2021, CONSULTATION DATE: 04/23/2021 REFERRING MD:  Dahlia Byes, MD, CHIEF COMPLAINT: Increasing shortness of breath  History of Present Illness:  71 year old male with coronary artery disease who initially presented with chest pain, noted to have multivessel coronary artery disease, he underwent CABG x3 on 04/1818 23, course was complicated with atrial fibrillation, frequent hiccups and aspiration pneumonia leading to hypoxia and increasing oxygen requirement. PCCM was consulted for evaluation and help with management  Patient stated hiccups are better but continued complain of shortness of breath, cough unable to bring up phlegm.  Pertinent  Medical History  CAD Allergic rhinitis Prostate cancer GERD Pulmonary fibrosis   Significant Hospital Events: Including procedures, antibiotic start and stop dates in addition to other pertinent events   1/18 CABG 1/30 PCCM consult, Worsening O2 needs, likely aspiration, new pressor need 2/2 intubation, placed on VV ECMO 2/5 issues with recirc, bronch neg, improved with supine positioning 2/7 percutaneous tracheostomy 2/10 diuresis and weaning sedation. CT chest shows diffuse ground-glass opacification.  2/11 weaning sedation 2/13 increasing air hunger as oxygenator efficiency decreasing.  2/14 oxygenator changed.  Still having air hunger but able to follow commands and participate with therapy.  2/15 considerable distress with double stacking. Switched to true rest settings - with improvement in distress.  2/16 bronch, NGT placed to LIWS, con't post-pyloric TF 2/17 DCCV overnight for Afib with RVR. Stood at bedside with PT.  Interim History / Subjective:  No events, O2 and PEEP increased on vent for sats in 80s but sats were in 80s yesterday.  Remains either too sedated or in distress, hard to find happy medium.  Objective   Blood pressure 123/71,  pulse 74, temperature 98.1 F (36.7 C), resp. rate (!) 7, height 5\' 10"  (1.778 m), weight 88.9 kg, SpO2 90 %. CVP:  [3 mmHg-67 mmHg] 7 mmHg  Vent Mode: PCV FiO2 (%):  [70 %-80 %] 80 % Set Rate:  [10 bmp] 10 bmp PEEP:  [10 RKY70-62 cmH20] 12 cmH20 Pressure Support:  [10 cmH20] 10 cmH20 Plateau Pressure:  [22 cmH20-24 cmH20] 24 cmH20   Intake/Output Summary (Last 24 hours) at 06/09/2021 3762 Last data filed at 06/09/2021 0600 Gross per 24 hour  Intake 4344.92 ml  Output 6525 ml  Net -2180.08 ml    Filed Weights   06/07/21 0600 06/08/21 0600 06/09/21 0500  Weight: 91 kg 87.8 kg 88.9 kg   Examination: No distress ECMO cannula with good color change, SvO2 62%, 4.8 LPM, Sweep 6.5 LPM Lung sounds absent TV in 70-80, plat 20, 10/10 PC Moves all 4 ext to stimulation Mild global anasarca Abdomen soft, cortrak with ongoing TF NGT in place with mildly sanguinous output  ABG alkalotic still Sodium up a bit but stable Cr okay LFTs more or less stable Lactate ok Plts dropping a bit still H/H okay On vanc CXR stable severe bilateral airspace disease  Ancillary tests personally reviewed:     Assessment & Plan:   Acute hypoxic and hypercapnic respiratory failure with hypoxia ARDS Aspiration pneumonitis- recurrent Hiccups, recurrent aspiration episodes.  ILD, mild- question of chronic aspiration syndrome Agitation, ICU delirium, and air hunger- issue leading to fentanyl need CAD with UA s/p CABG x 3, (LIMA-LAD, SVG-OM, SVG-left PDA) NSVT Afib with RVR, DCCV, back in NSR Hypertension Possible GPC bacteremia vs contaminant- staph epi in 2/4 bottles- on 7 days vanc - Lung protective TV, increase PEEP a bit  today as sats seem to respond to this - Sweep wean to pH > 7.35 and/or WOB - NG to LIS, cortrak for PP TF, reglan - VV ECMO and diuresis management per CHF team -con't baclofen, morphine, seroquel, klonipin, melatonin and gabapentin; wean down fentanyl drip as able -  colchicine per TCTS - bowel regimen as ordered, rectal management system - Continue PT/OT efforts -PPI BID -finish out trial of steroids, did not seem to have effect - Overall needs time to see if will recover -aspirin, statin -holding coreg due to bradycardia - Back on IV amiodarone for frequent ectopy and questionable GI absorption -hydralazine PRN; can add scheduled if needed  Discussed in multidisciplinary rounds.  I think we continue current aggressive support until 2/27 and get a repeat CT that day.  If no improvement in GGO or there are fibrotic changes, we will need to consider setting a deadline for continued ECMO support due to futility.  Best Practice (right click and "Reselect all SmartList Selections" daily)   Diet/type: TF DVT prophylaxis: [place] CBG: SSI GI prophylaxis: PPI Lines: L [place] TLC, RIJ  Foley:  Yes Code Status:  full code Last date of multidisciplinary goals of care discussion [ 2/16]    Patient critically ill due to resp failure, delirium Interventions to address this today sedation and vent titration Risk of deterioration without these interventions is high  I personally spent 38 minutes providing critical care not including any separately billable procedures  Erskine Emery MD [facility]  Prefer epic messenger for cross cover needs If after hours, please call E-link

## 2021-06-09 NOTE — Progress Notes (Signed)
0700 - Report received from [place], [place].  All questions answered.  All lines and drips verified.  Safety checks performed. Hand hygiene performed before/after each pt contact. Dr. Tamala Julian rounded. 0730 - Dr. Tamala Julian and Dr. Aundra Dubin rounded.  Discussed possible malabsorption based on the amount of volume suctioned after large boluses of medication.  Pt agitated and given Fentanyl bolus.  Pt calmed. 0800 - Assessment. 0086 - Dr. Darcey Nora rounded.  He vocalized concern for length of time the a-line had been in place.  Same was discussed with Dr. Tamala Julian yesterday (06/08/21). 0900 - OT touched base to see when would be a good time to work with pt.  We discussed her coming by around 1300 with a plan to tilt at around 1000. 0915 - Ortho Tech arrived to place [employer] on pts. 0930 - Pt strapped to bed and then tilted to 40 degrees from [id number].  Pt's SpO2 increased to 95%.  Pt tolerated it well.  Pt then bathed.  Attempted to place pt in chair position; however, SpO2 decreased to 83%.  Pt placed in a semi-Fowler's position and SpO2 increased to 90%.  Dsg changed to ECMO cannula site d/t oozing. 1030 - Morning rounds. 1100 - Pt's wife, Jeannene Patella, arrived to visit.  Pam updated.  All questions answered. [place], another family member, arrived to visit. [place] and Barbara left to get lunch. [place], RD rounded on pt d/t concerns about malabsorption/regurgitation. [place], OT worked with pt.  Pam in the room.  Pt sat on the side of the bed with stretches performed.  PIV in L hand and ECMO cannula oozing around site. PIV re-secured. 1345 - Pam left. 1445 - Pt noted to be oozing around a-line site.  [place], RT changing dressing and I discussed Dr. Thayer Ohm concern about a-line's duration.  [place], RT obtained a new a-line in pt's R radial artery utilizing sterile technique. 1600 - ECMO cannula redressed with sterile technique and Surgicel d/t oozing. 1615 - New bag of TF hung with new  tubing and new Lopez valve. 1650 - 1710 Pt tilted at 40 degrees.  Pt tolerated it well. 1800 - Pt seems calmer when holding a hand.  Warm water filled gloves placed in his hands.  Pt holding gloves.  Pt calm. 1900 - Report given to Caryl Pina, Therapist, sports.  All questions answered.

## 2021-06-09 NOTE — Progress Notes (Signed)
°   06/09/21 1515  Clinical Encounter Type  Visited With Health care provider;Patient not available  Visit Type Critical Care;Initial  Referral From Chaplain  Consult/Referral To Chaplain  Spiritual Encounters  Spiritual Needs Emotional   Referral from [place] Patient's Primary Nurse to meet with and provide emotional and spiritual support to Mrs. Mcclish. Spoke with patient's primary nurse who reported that Mrs. Yong Channel has gone home but may return later. Chaplain Minor Iden, M.Min., [phone].

## 2021-06-09 NOTE — Procedures (Signed)
Arterial Catheter Insertion Procedure Note  KARLIS CREGG  [id number]  13-Feb-1951  Date:06/09/21  Time:3:30 PM    Provider Performing: Esperanza Sheets T    Procedure: Insertion of Arterial Line [phone]) without US guidance  Indication(s) Blood pressure monitoring and/or need for frequent ABGs  Consent Risks of the procedure as well as the alternatives and risks of each were explained to the patient and/or caregiver.  Consent for the procedure was obtained and is signed in the bedside chart  Anesthesia None   Time Out Verified patient identification, verified procedure, site/side was marked, verified correct patient position, special equipment/implants available, medications/allergies/relevant history reviewed, required imaging and test results available.   Sterile Technique Maximal sterile technique including full sterile barrier drape, hand hygiene, sterile gown, sterile gloves, mask, hair covering, sterile ultrasound probe cover (if used).   Procedure Description Area of catheter insertion was cleaned with chlorhexidine and draped in sterile fashion. Without real-time ultrasound guidance an arterial catheter was placed into the right radial artery.  Appropriate arterial tracings confirmed on monitor.     Complications/Tolerance None; patient tolerated the procedure well.   EBL Minimal   Specimen(s) None

## 2021-06-09 NOTE — Progress Notes (Signed)
Inpatient Rehab Admissions Coordinator:   Per therapy recommendations,  patient was screened for CIR candidacy by Clemens Catholic, MS, CCC-SLP.  At this time, Pt.  Continues to require ECMO and vent support and is not medically ready for CIR. I will not pursue a rehab consult for this Pt. at this time, but CIR admissions team will follow and monitor for medical readiness and place consult order if Pt. appears to be an appropriate candidate. Please contact me with any questions.   Clemens Catholic, [place], [place] Admissions Coordinator  [phone] ([place]) [phone] (office)

## 2021-06-09 NOTE — Progress Notes (Signed)
Patient ID: SHANNAN GARFINKEL, male   DOB: [birth year], 71 y.o.   MRN: [id number]    Progress Note  Patient Name: Randall Hodges Date of Encounter: 06/09/2021  [facility] Cardiologist: [name], MD   Subjective   2/2: VV ECMO initiation, Crescent catheter right IJ 2/7: Tracheostomy 2/13 CT C/A/P: Diffuse ground-glass opacities consistent with atelectasis and edema presumably related to ECMO physiology and respiratory failure.  Underlying moderate interstitial lung disease mildly progressed from prior CT. 2/15: Oxygenator changed, trach changed.  2/16: Transient asystole with cough and adjustment of tracheostomy overnight.  2/17: Atrial flutter with RVR, required emergent DCCV and amiodarone restarted  CXR with severe bilateral diffuse disease, right lung starting to improve.   Remains on vancomycin.  Suspect aspiration again with TFs in trach on 2/16, NGT placed to suction and also has post-pyloric tube for feeds.    Lung protective ventilation with ARDS.  Vent FiO2 0.5. Vt 65 cc.  CVP 9 this morning, on Lasix gtt with excellent diuresis (net negative 2.2 L).  Creatinine and BUN stable.  No chugging.   NSR this morning on amiodarone gtt.   Patient has been working with PT, awake/alert this morning.   ECMO: Speed 3700 rpm Flow 4.95 L/min pVen -95 DeltaP 25 Sweep 6 PTT 54 (goal 50-80) LDH 202 ABG 7.47/45/61/92% Lactate 1.4   Cardiac Studies: Echo (limited, 1/30): Echo reviewed, there is clear respirophasic variation of the interventricular septum and marked respirophasic variation of E inflow velocity on doppler evaluation of the mitral valve.  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  LV EF 45-50%.   RHC Procedural Findings (on norepinephrine 6): Hemodynamics (mmHg) RA mean 12 RV  37/12 PA 38/16, mean 27 PCWP mean 11 LV 108/12 AO 96/55 PAPI 1.8 Oxygen saturations: PA 54% AO 94% Cardiac Output (Fick) 5.59  Cardiac Index (Fick) 2.81  PVR 2.8 WU Simultaneous RV/LV tracings were obtained.  Difficult to interpret due to atrial fibrillation.  There was some suggestion of discordance (ventricular interdependence) but not clear.  Inpatient Medications    Scheduled Meds:  aspirin  81 mg Per Tube Daily   baclofen  5 mg Per Tube TID   chlorhexidine gluconate (MEDLINE KIT)  15 mL Mouth Rinse BID   Chlorhexidine Gluconate Cloth  6 each Topical Daily   clonazePAM  1 mg Per Tube BID   colchicine  0.6 mg Per Tube Daily   feeding supplement (PROSource TF)  90 mL Per Tube BID   fiber  1 packet Per Tube TID   free water  200 mL Per Tube Q4H   gabapentin  300 mg Per Tube QHS   Gerhardt's butt cream   Topical TID   guaiFENesin  30 mL Per Tube QID   insulin aspart  0-15 Units Subcutaneous Q4H   mouth rinse  15 mL Mouth Rinse 10 times per day   melatonin  5 mg Per Tube QHS   methylPREDNISolone (SOLU-MEDROL) injection  20 mg Intravenous Q24H   metoCLOPramide (REGLAN) injection  5 mg Intravenous Q8H   morphine  30 mg Per Tube Q6H   pantoprazole sodium  40 mg Per Tube BID   potassium chloride  40 mEq Oral BID   QUEtiapine  25 mg Per Tube QHS   rosuvastatin  10 mg Per Tube Daily   sodium chloride flush  10-40 mL Intracatheter Q12H  sodium chloride flush  10-40 mL Intracatheter Q12H   sodium chloride flush  3 mL Intravenous Q12H   Continuous Infusions:  sodium chloride Stopped (06/01/21 1121)   sodium chloride 10 mL/hr at 06/09/21 0600   sodium chloride Stopped (06/06/21 1924)   albumin human 12.5 g (06/08/21 1704)   albumin human 12.5 g (06/08/21 1820)   amiodarone 30 mg/hr (06/09/21 0621)   bivalirudin (ANGIOMAX) infusion 0.5 mg/mL (Non-ACS indications) 0.065 mg/kg/hr (06/09/21 0600)   dexmedetomidine (PRECEDEX) IV infusion Stopped (06/05/21 0655)   feeding  supplement (VITAL 1.5 CAL) 1,000 mL (06/08/21 2127)   fentaNYL infusion INTRAVENOUS 50 mcg/hr (06/09/21 0600)   furosemide (LASIX) 200 mg in dextrose 5% 100 mL ($Remov'2mg'ttTacD$ /mL) infusion 8 mg/hr (06/09/21 0600)   vancomycin Stopped (06/08/21 1203)   PRN Meds: Place/Maintain arterial line **AND** sodium chloride, sodium chloride, sodium chloride, acetaminophen (TYLENOL) oral liquid 160 mg/5 mL, albumin human, albumin human, docusate, fentaNYL, fentaNYL (SUBLIMAZE) injection, fentaNYL (SUBLIMAZE) injection, hydrALAZINE, levalbuterol, lip balm, ondansetron (ZOFRAN) IV, polyvinyl alcohol, sennosides, sodium chloride flush, white petrolatum   Vital Signs    Vitals:   06/09/21 0329 06/09/21 0400 06/09/21 0500 06/09/21 0715  BP: 118/65 122/64 123/71   Pulse: 76 74 74   Resp: 11 (!) 9 (!) 7   Temp:      TempSrc:      SpO2: 94% 91% 90% 92%  Weight:   88.9 kg   Height:        Intake/Output Summary (Last 24 hours) at 06/09/2021 0748 Last data filed at 06/09/2021 0600 Gross per 24 hour  Intake 4344.92 ml  Output 6525 ml  Net -2180.08 ml   Last 3 Weights 06/09/2021 06/08/2021 06/07/2021  Weight (lbs) 195 lb 15.8 oz 193 lb 9 oz 200 lb 9.9 oz  Weight (kg) 88.9 kg 87.8 kg 91 kg      Telemetry    NSR, personally reviewed.   Physical Exam   General: Awake on vent Neck: RIJ Crescent cannula, no thyromegaly or thyroid nodule.  Lungs: Decreased bilaterally.  CV: Nondisplaced PMI.  Heart regular S1/S2, no S3/S4, no murmur.  1+ edema to knees.  Abdomen: Soft, nontender, no hepatosplenomegaly, no distention.  Skin: Intact without lesions or rashes.  Neurologic: Alert and follows commands.  Extremities: No clubbing or cyanosis.  HEENT: Normal.    Labs    High Sensitivity Troponin:   Recent Labs  Lab 06/05/21 2208 06/06/21 0037 06/06/21 0532  TROPONINIHS 211* 226* 196*      Chemistry Recent Labs  Lab 06/05/21 2208 06/06/21 0353 06/07/21 0419 06/07/21 0429 06/08/21 0444 06/08/21 0657  06/08/21 0804 06/08/21 1601 06/08/21 1603 06/08/21 2345 06/09/21 0407 06/09/21 0410  NA 144   < > 143   < > 146*  --    < > 146*   < > 146* 146* 146*  K 3.4*   < > 4.0   < > 4.2  --    < > 3.6   < > 4.7 4.1 4.1  CL 110   < > 105   < > 105  --   --  104  --   --  104  --   CO2 25   < > 29   < > 31  --   --  32  --   --  33*  --   GLUCOSE 175*   < > 155*   < > 167*  --   --  165*  --   --  138*  --   BUN 58*   < > 55*   < > 56*  --   --  55*  --   --  52*  --   CREATININE 1.04   < > 1.11   < > 1.09  --   --  1.08  --   --  1.10  --   CALCIUM 7.8*   < > 7.7*   < > 8.0*  --   --  8.0*  --   --  8.3*  --   MG 2.0  --   --   --   --  2.1  --   --   --   --   --   --   PROT  --    < > 4.5*  --  4.9*  --   --   --   --   --  4.8*  --   ALBUMIN  --    < > 2.8*  --  2.9*  --   --   --   --   --  3.1*  --   AST  --    < > 40  --  58*  --   --   --   --   --  39  --   ALT  --    < > 73*  --  90*  --   --   --   --   --  77*  --   ALKPHOS  --    < > 89  --  97  --   --   --   --   --  82  --   BILITOT  --    < > 0.7  --  0.5  --   --   --   --   --  0.5  --   GFRNONAA >60   < > >60   < > >60  --   --  >60  --   --  >60  --   ANIONGAP 9   < > 9   < > 10  --   --  10  --   --  9  --    < > = values in this interval not displayed.    Lipids  No results for input(s): CHOL, TRIG, HDL, LABVLDL, LDLCALC, CHOLHDL in the last 168 hours.   Hematology Recent Labs  Lab 06/08/21 0444 06/08/21 0804 06/08/21 1601 06/08/21 1603 06/08/21 2345 06/09/21 0407 06/09/21 0410  WBC 7.8  --  9.5  --   --  9.3  --   RBC 3.03*  --  3.11*  --   --  3.01*  --   HGB 9.0*   < > 9.3*   < > 9.2* 8.9* 8.8*  HCT 28.5*   < > 29.5*   < > 27.0* 28.6* 26.0*  MCV 94.1  --  94.9  --   --  95.0  --   MCH 29.7  --  29.9  --   --  29.6  --   MCHC 31.6  --  31.5  --   --  31.1  --   RDW 17.4*  --  17.7*  --   --  18.0*  --   PLT 123*  --  114*  --   --  104*  --    < > = values in this interval not displayed.  Thyroid No  results for input(s): TSH, FREET4 in the last 168 hours.  BNPNo results for input(s): BNP, PROBNP in the last 168 hours.  DDimer No results for input(s): DDIMER in the last 168 hours.   Radiology    DG CHEST PORT 1 VIEW  Result Date: 06/08/2021 CLINICAL DATA:  History of ECMO. EXAM: PORTABLE CHEST 1 VIEW COMPARISON:  AP chest 06/07/2021 FINDINGS: Tracheostomy tube again overlies the midline trachea. Status post median sternotomy. Enteric tube again descends below the diaphragm with the tip excluded by collimation. A second enteric tube tip likely terminates over the distal esophagus, similar to prior. Right upper extremity PICC tip again overlies the superior right atrium. Diffuse bilateral heterogeneous airspace opacities slightly greater on the left are similar to prior. The costophrenic angles are opacified. No pneumothorax is visualized. The cardiothymic silhouette is again obscured. Mildly displaced right posterolateral second rib fracture is unchanged. IMPRESSION:: IMPRESSION: 1. Persistent diffuse bilateral heterogeneous airspace opacities with likely bilateral pleural effusions, not significantly changed from prior. 2. Support apparatus appears unchanged. Electronically Signed   By: Yvonne Kendall M.D.   On: 06/08/2021 08:05    Cardiac Studies   Cath 04/19/2021 Distal left main [place] 111 bifurcation stenosis with 75% left main, 90% ostial to proximal LAD, and 80-90% ostial circumflex (difficult to assess due to heavy calcification). Severe mid circumflex disease with 70% eccentric mid stenosis and second obtuse marginal containing ostial to proximal greater than 80% stenosis.  (Bifurcation [place] 111 Severe calcification in left main and LAD in particular with diffuse 50% narrowing from proximal to mid vessel and tandem 70% stenoses in the mid LAD. Nondominant right coronary Normal LV function.  EF 55%.  LVEDP normal.    Patient Profile     71 y.o. male with PMH of PVCs presented with  chest pain. Cardiac cath by Dr. Tamala Julian on 04/20/2021 showed 75% left main, 90% ost to prox LAD, 80-90% ost LCx, 70% mid LCx, 80% OM2, 50% prox to mid LAD, 70% mid LAD, EF 55%. Patient underwent CABG x 3 on 05/15/2021. Post op course complicated afib, treated with amio. CXR showed opacity in bilateral lung, started abx on 1/25 and diuretic. Started on Eliquis due to recurrence of afib.    Assessment & Plan    1. CAD: Admitted with unstable angina, cath with severe left main and proximal LAD/LCx disease (nondominant RCA).  CABG x 3 on 1/18 with LIMA-LAD, SVG-OM, SVG-left PDA.   - Continue ASA 81, statin.  2. 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 RHC 1/30 with equalization of diastolic pressures.  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  Repeated echo 2/2 still showed respirophasic septal variation but not as impressive. CVP 9 today, has extensive edema.  Lungs still with diffuse bilateral infiltrates on CXR, unchanged.  Diuresed well with IV Lasix gtt yesterday, BUN/creatinine stable. .  - Continue Lasix gtt 8 mg/hr, can decrease with excessive chugging.  - With concern for development of post-surgical effusive/constrictive pericarditis, he is on colchicine.  - ?need for surgical intervention on effusive/constrictive pericarditis. Will need improvement of lung disease first then reassess, repeat echo not as impressive.   3. Shock: In setting of suspected  effusive/constrictive pericarditis but also PNA.  Suspect primarily septic/vasodilatory shock. He is now off pressors.  4. Hiccups:  Intractable initially, now improved.  - On baclofen and gabapentin.  5. PNA with acute hypoxic respiratory failure: Of note, he does appear to have had some pre-existing ILD from 2019 CT chest (?sarcoidosis). CXR with persistent bilateral infiltrates, possible mild improvement compared to yesterday.  Have thought most likely aspiration PNA/pneumonitis in setting of intractable hiccups. He has developed ARDS. COVID was negative.  FiO2 0.5 on vent.  CXR with bilateral diffuse airspace disease, unchanged. Has tracheostomy. CT chest 2/9 with persistent lung infiltrates.  Completed vancomycin and meropenem => MRSE 1/2 blood and trach aspirate, now back on vancomycin.  Suspect ongoing aspiration with TFs noted in trach on 2/16, now with NGT to suction and post-pyloric feeding tube.  LDH stable today. Stable ECMO circuit.  Lung protective ventilation, still only 65 cc tidal volume.  Stable ABG.  Not making much improvement from pulmonary standpoint.  - Vent and sedation per CCM.  - Solumedrol 20 mg IV daily with gradual taper, ESR now normal.  - Back on amiodarone but suspect not amiodarone lung toxicity.   - Complete 7 days vancomycin.  - Repeat CT chest next Monday to look for any improvement.  At that point, will need discussions about future plan.  6. Atypical atrial flutter/PVCs:  DCCV 2/17.  NSR this morning.   - Continue IV amiodarone.    - Bivalirudin gtt, goal PTT 50-80.  7. AKI: Creatinine stable 1.1.  Follow closely.  8. Elevated LFTs: Suspect shock liver, follow CMET.  LFTs trending down.  9. Anemia: Transfuse hgb < 8.     10. Hypernatremia: Na 146 today.  - Continue free water 200 q4.  11. HTN: BP rises with sedation wean.  - prn hydralazine for now.  12. Bradycardia/asystole: Vagally mediated with cough.  Resolves rapidly with resolution of cough.  - Atropine at bedside.  13. FEN: TFs restarted with post-pyloric tube and also NGT to suction to prevent aspiration.   - Continue Reglan.    14. Mobilize as much as possible with PT.  Limit sedation.   CRITICAL CARE Performed by: Loralie Champagne  Total critical care time: 45 minutes  Critical care time was exclusive of separately billable procedures and treating other patients.  Critical care was necessary to treat or prevent imminent or life-threatening deterioration.  Critical care was time spent personally by me on the following activities: development of treatment plan with patient and/or surrogate as well as nursing, discussions with consultants, evaluation of patient's response to treatment, examination of patient, obtaining history from patient or surrogate, ordering and performing treatments and interventions, ordering and review of laboratory studies, ordering and review of radiographic studies, pulse oximetry and re-evaluation of patient's condition.  Loralie Champagne MD 06/09/2021 7:48 AM

## 2021-06-09 NOTE — Progress Notes (Signed)
ANTICOAGULATION CONSULT NOTE  Pharmacy Consult for Bivalirudin Indication:  ECMO  Allergies  Allergen Reactions   Amoxicillin-Pot Clavulanate     Per pt report on 08/19/20, makes his urine dark colored   Atorvastatin Other (See Comments)     ( pt states causing runny nose, headaches, issue w/ urination)   Plant Derived Enzymes     Other reaction(s): Unknown   Trichophyton Other (See Comments)    Patient Measurements: Height: 5\' 10"  (177.8 cm) Weight: 88.9 kg (195 lb 15.8 oz) IBW/kg (Calculated) : 73  Vital Signs: Temp: 98.1 F (36.7 C) (02/21 0000) BP: 123/71 (02/21 0500) Pulse Rate: 74 (02/21 0500)  Labs: Recent Labs    06/07/21 0419 06/07/21 0429 06/08/21 0444 06/08/21 0804 06/08/21 1601 06/08/21 1603 06/08/21 2345 06/09/21 0407 06/09/21 0410  HGB 9.9*   < > 9.0*   < > 9.3*   < > 9.2* 8.9* 8.8*  HCT 30.3*   < > 28.5*   < > 29.5*   < > 27.0* 28.6* 26.0*  PLT 133*   < > 123*  --  114*  --   --  104*  --   APTT 57*   < > 61*  --  63*  --   --  54*  --   LABPROT 24.3*  --  24.7*  --   --   --   --  24.8*  --   INR 2.2*  --  2.2*  --   --   --   --  2.2*  --   CREATININE 1.11   < > 1.09  --  1.08  --   --  1.10  --    < > = values in this interval not displayed.     Estimated Creatinine Clearance: 69.2 mL/min (by C-G formula based on SCr of 1.1 mg/dL).   Medical History: Past Medical History:  Diagnosis Date   Allergic rhinitis    Prostate cancer ([place])     Medications:  Infusions:   sodium chloride Stopped (06/01/21 1121)   sodium chloride 10 mL/hr at 06/09/21 0600   sodium chloride Stopped (06/06/21 1924)   albumin human 12.5 g (06/08/21 1704)   albumin human 12.5 g (06/08/21 1820)   amiodarone 30 mg/hr (06/09/21 0621)   bivalirudin (ANGIOMAX) infusion 0.5 mg/mL (Non-ACS indications) 0.065 mg/kg/hr (06/09/21 0600)   dexmedetomidine (PRECEDEX) IV infusion Stopped (06/05/21 0655)   feeding supplement (VITAL 1.5 CAL) 1,000 mL (06/08/21 2127)   fentaNYL  infusion INTRAVENOUS 50 mcg/hr (06/09/21 0600)   furosemide (LASIX) 200 mg in dextrose 5% 100 mL (2mg /mL) infusion 8 mg/hr (06/09/21 0600)   vancomycin Stopped (06/08/21 1203)    Assessment: 71 year old male with coronary artery disease who initially presented with chest pain, noted to have multivessel coronary artery disease. He underwent CABG x3 on 05/04/2021; course was complicated with atrial fibrillation, frequent hiccups and aspiration pneumonia leading to hypoxia and increasing oxygen requirement. Patient now requiring VV ECMO. Pharmacy consulted for bivalirudin IV.  aPTT this morning therapeutic at 54 seconds, CBC stable, pltc remains low but stable  Goal of Therapy:  aPTT 50-80 seconds Monitor platelets by anticoagulation protocol: Yes   Plan:  Continue bivalirudin at 0.065 mg/kg/hr Monitor q12h aptt and CBC Monitor closely for s/sx bleeding/thrombus   Arrie Senate, PharmD, BCPS, [facility] Clinical Pharmacist [phone] Please check AMION for all [place] numbers 06/09/2021

## 2021-06-09 NOTE — Progress Notes (Addendum)
Occupational Therapy Treatment Patient Details Name: Randall Hodges MRN: [id number] DOB: [birth year] Today's Date: 06/09/2021   History of present illness 71 yo male with CAD who initially presented with CP, noted to have multivessel CAD, he underwent CABG x3 on 05/01/2021, course was complicated with atrial fibrillation, frequent hiccups and aspiration PNA leading to hypoxia and increasing oxygen requirement. ETT 1/18-1/18. 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 Cortrak 1/30. 2/2 placed on VV ECMO. 2/7 trach. 2/14 ECMO circuit replacement. PMH: prostate CA   OT comments  Upon arrival, pt supine in bed and lifting BUEs activity x3 without prompting. Pt requiring Total A +3 for bed mobility. While sitting at EOB, pt participating in trunk rotation and scapular retraction with Max A. Facilitating PROM for BUEs while seated. Returning to supine as pt with bleeding from ecmo site. RN and ecmo specialist present throughout session. Continue to recommend dc to post-acute rehab and will continue to follow acutely as admitted .   Recommendations for follow up therapy are one component of a multi-disciplinary discharge planning process, led by the attending physician.  Recommendations may be updated based on patient status, additional functional criteria and insurance authorization.    Follow Up Recommendations  Acute inpatient rehab (3hours/day)    Assistance Recommended at Discharge Frequent or constant Supervision/Assistance  Patient can return home with the following  A lot of help with walking and/or transfers   Equipment Recommendations  Other (comment) (defer)    Recommendations for Other Services      Precautions / Restrictions Precautions Precautions: Fall;Sternal Precaution Booklet Issued: No Precaution Comments: ECMO, cortrak, foley, aline, sternal precautions, flexiseal, NG tube, trach on vent Restrictions Other Position/Activity Restrictions: sternal precautions        Mobility Bed Mobility Overal bed mobility: Needs Assistance Bed Mobility: Supine to Sit, Sit to Supine     Supine to sit: Total assist, +2 for physical assistance Sit to supine: +2 for physical assistance, Total assist   General bed mobility comments: TotalA + 3 for trunk/BLE/line management    Transfers                   General transfer comment: deferred     Balance Overall balance assessment: Needs assistance Sitting-balance support: Feet supported Sitting balance-Leahy Scale: Zero                                     ADL either performed or assessed with clinical judgement   ADL Overall ADL's : Needs assistance/impaired                                       General ADL Comments: Sitting EOB and engaging in trunk ROM    Extremity/Trunk Assessment Upper Extremity Assessment Upper Extremity Assessment: RUE deficits/detail;LUE deficits/detail RUE Deficits / Details: Significant weakness and edema. Squeezing therapists hand once. WFL PROM. RUE Coordination: decreased fine motor;decreased gross motor LUE Deficits / Details: Significant weakness and edema. WFL PROM. LUE Coordination: decreased fine motor;decreased gross motor   Lower Extremity Assessment Lower Extremity Assessment: Defer to PT evaluation        Vision       Perception     Praxis      Cognition Arousal/Alertness: Awake/alert Behavior During Therapy: Flat affect Overall Cognitive Status: Difficult to assess  General Comments: Pt with intermittent response to yes/no questions, following commands ~20% of the time        Exercises Exercises: General Upper Extremity, General Lower Extremity, Other exercises General Exercises - Upper Extremity Shoulder Flexion: Both, PROM, 10 reps, Seated Shoulder ABduction: PROM, Right, Left, 10 reps, Seated Shoulder ADduction: PROM, Right, Left, 10 reps, Seated Elbow  Flexion: PROM, Both, 10 reps, Other (comment), Seated Elbow Extension: PROM, Both, 10 reps, Seated Other Exercises Other Exercises: Sitting: trunk rotation; PROM; x5 left and right; Max A Other Exercises: Sitting: scapular retractions, x5, Max A, assist for holding head upright    Shoulder Instructions       General Comments SpO2 mid 80s; RN managing ECMO and vent. HR and BP stable.    Pertinent Vitals/ Pain       Pain Assessment Pain Assessment: Faces Faces Pain Scale: Hurts little more Pain Location: generalized grimacing with coughing Pain Descriptors / Indicators: Grimacing Pain Intervention(s): Monitored during session, Limited activity within patient's tolerance, Repositioned  Home Living                                          Prior Functioning/Environment              Frequency  Min 2X/week        Progress Toward Goals  OT Goals(current goals can now be found in the care plan section)  Progress towards OT goals: Progressing toward goals  Acute Rehab OT Goals OT Goal Formulation: Patient unable to participate in goal setting Time For Goal Achievement: 06/19/21 Potential to Achieve Goals: Good ADL Goals Pt Will Perform Grooming: with mod assist;sitting Pt Will Transfer to Toilet: with max assist;with +2 assist;stand pivot transfer Additional ADL Goal #1: Pt will follow one step commands 50% of session with Min cues Additional ADL Goal #2: Pt will tolerate 3 minutes sitting at EOB with Mod A in preparation for ADLs  Plan Discharge plan remains appropriate    Co-evaluation                 AM-PAC OT "6 Clicks" Daily Activity     Outcome Measure   Help from another person eating meals?: Total Help from another person taking care of personal grooming?: Total Help from another person toileting, which includes using toliet, bedpan, or urinal?: Total Help from another person bathing (including washing, rinsing, drying)?:  Total Help from another person to put on and taking off regular upper body clothing?: Total Help from another person to put on and taking off regular lower body clothing?: Total 6 Click Score: 6    End of Session Equipment Utilized During Treatment: Oxygen  OT Visit Diagnosis: Muscle weakness (generalized) (M62.81)   Activity Tolerance Patient limited by fatigue   Patient Left with call bell/phone within reach;in bed;with nursing/sitter in room (chair position)   Nurse Communication Mobility status;Precautions        Time: 8341-9622 OT Time Calculation (min): 35 min  Charges: OT General Charges $OT Visit: 1 Visit OT Treatments $Therapeutic Activity: 23-37 mins  [place], OTR/L Acute Rehab Pager: [phone] Office: [place] 06/09/2021, 2:30 PM

## 2021-06-10 ENCOUNTER — Inpatient Hospital Stay (HOSPITAL_COMMUNITY): Payer: PPO

## 2021-06-10 DIAGNOSIS — J9601 Acute respiratory failure with hypoxia: Secondary | ICD-10-CM | POA: Diagnosis not present

## 2021-06-10 DIAGNOSIS — I249 Acute ischemic heart disease, unspecified: Secondary | ICD-10-CM | POA: Diagnosis not present

## 2021-06-10 LAB — POCT I-STAT 7, (LYTES, BLD GAS, ICA,H+H)
Acid-Base Excess: 12 mmol/L — ABNORMAL HIGH (ref 0.0–2.0)
Acid-Base Excess: 14 mmol/L — ABNORMAL HIGH (ref 0.0–2.0)
Acid-Base Excess: 12 mmol/L — ABNORMAL HIGH (ref 0.0–2.0)
Acid-Base Excess: 12 mmol/L — ABNORMAL HIGH (ref 0.0–2.0)
Acid-Base Excess: 12 mmol/L — ABNORMAL HIGH (ref 0.0–2.0)
Acid-Base Excess: 10 mmol/L — ABNORMAL HIGH (ref 0.0–2.0)
Bicarbonate: 38.2 mmol/L — ABNORMAL HIGH (ref 20.0–28.0)
Bicarbonate: 39.3 mmol/L — ABNORMAL HIGH (ref 20.0–28.0)
Bicarbonate: 38.8 mmol/L — ABNORMAL HIGH (ref 20.0–28.0)
Bicarbonate: 39.5 mmol/L — ABNORMAL HIGH (ref 20.0–28.0)
Bicarbonate: 38.7 mmol/L — ABNORMAL HIGH (ref 20.0–28.0)
Bicarbonate: 37.1 mmol/L — ABNORMAL HIGH (ref 20.0–28.0)
Calcium, Ion: 1.18 mmol/L (ref 1.15–1.40)
Calcium, Ion: 1.19 mmol/L (ref 1.15–1.40)
Calcium, Ion: 1.18 mmol/L (ref 1.15–1.40)
Calcium, Ion: 1.22 mmol/L (ref 1.15–1.40)
Calcium, Ion: 1.22 mmol/L (ref 1.15–1.40)
Calcium, Ion: 1.2 mmol/L (ref 1.15–1.40)
HCT: 28 % — ABNORMAL LOW (ref 39.0–52.0)
HCT: 28 % — ABNORMAL LOW (ref 39.0–52.0)
HCT: 29 % — ABNORMAL LOW (ref 39.0–52.0)
HCT: 30 % — ABNORMAL LOW (ref 39.0–52.0)
HCT: 28 % — ABNORMAL LOW (ref 39.0–52.0)
HCT: 31 % — ABNORMAL LOW (ref 39.0–52.0)
Hemoglobin: 9.5 g/dL — ABNORMAL LOW (ref 13.0–17.0)
Hemoglobin: 9.5 g/dL — ABNORMAL LOW (ref 13.0–17.0)
Hemoglobin: 9.9 g/dL — ABNORMAL LOW (ref 13.0–17.0)
Hemoglobin: 10.2 g/dL — ABNORMAL LOW (ref 13.0–17.0)
Hemoglobin: 9.5 g/dL — ABNORMAL LOW (ref 13.0–17.0)
Hemoglobin: 10.5 g/dL — ABNORMAL LOW (ref 13.0–17.0)
O2 Saturation: 97 %
O2 Saturation: 98 %
O2 Saturation: 94 %
O2 Saturation: 94 %
O2 Saturation: 93 %
O2 Saturation: 88 %
Patient temperature: 36.7
Patient temperature: 36.8
Patient temperature: 36.7
Patient temperature: 98
Patient temperature: 98.1
Patient temperature: 36.7
Potassium: 4.2 mmol/L (ref 3.5–5.1)
Potassium: 3.9 mmol/L (ref 3.5–5.1)
Potassium: 3.9 mmol/L (ref 3.5–5.1)
Potassium: 3.9 mmol/L (ref 3.5–5.1)
Potassium: 3.7 mmol/L (ref 3.5–5.1)
Potassium: 4.2 mmol/L (ref 3.5–5.1)
Sodium: 147 mmol/L — ABNORMAL HIGH (ref 135–145)
Sodium: 147 mmol/L — ABNORMAL HIGH (ref 135–145)
Sodium: 146 mmol/L — ABNORMAL HIGH (ref 135–145)
Sodium: 147 mmol/L — ABNORMAL HIGH (ref 135–145)
Sodium: 147 mmol/L — ABNORMAL HIGH (ref 135–145)
Sodium: 147 mmol/L — ABNORMAL HIGH (ref 135–145)
TCO2: 40 mmol/L — ABNORMAL HIGH (ref 22–32)
TCO2: 41 mmol/L — ABNORMAL HIGH (ref 22–32)
TCO2: 41 mmol/L — ABNORMAL HIGH (ref 22–32)
TCO2: 42 mmol/L — ABNORMAL HIGH (ref 22–32)
TCO2: 41 mmol/L — ABNORMAL HIGH (ref 22–32)
TCO2: 39 mmol/L — ABNORMAL HIGH (ref 22–32)
pCO2 arterial: 58.4 mmHg — ABNORMAL HIGH (ref 32–48)
pCO2 arterial: 55.5 mmHg — ABNORMAL HIGH (ref 32–48)
pCO2 arterial: 62.8 mmHg — ABNORMAL HIGH (ref 32–48)
pCO2 arterial: 68.6 mmHg (ref 32–48)
pCO2 arterial: 63 mmHg — ABNORMAL HIGH (ref 32–48)
pCO2 arterial: 66.1 mmHg (ref 32–48)
pH, Arterial: 7.422 (ref 7.35–7.45)
pH, Arterial: 7.457 — ABNORMAL HIGH (ref 7.35–7.45)
pH, Arterial: 7.398 (ref 7.35–7.45)
pH, Arterial: 7.367 (ref 7.35–7.45)
pH, Arterial: 7.395 (ref 7.35–7.45)
pH, Arterial: 7.357 (ref 7.35–7.45)
pO2, Arterial: 92 mmHg (ref 83–108)
pO2, Arterial: 101 mmHg (ref 83–108)
pO2, Arterial: 73 mmHg — ABNORMAL LOW (ref 83–108)
pO2, Arterial: 74 mmHg — ABNORMAL LOW (ref 83–108)
pO2, Arterial: 68 mmHg — ABNORMAL LOW (ref 83–108)
pO2, Arterial: 58 mmHg — ABNORMAL LOW (ref 83–108)

## 2021-06-10 LAB — TYPE AND SCREEN
ABO/RH(D): O POS
Antibody Screen: NEGATIVE
Unit division: 0
Unit division: 0
Unit division: 0
Unit division: 0

## 2021-06-10 LAB — BASIC METABOLIC PANEL
Anion gap: 9 (ref 5–15)
Anion gap: 8 (ref 5–15)
BUN: 50 mg/dL — ABNORMAL HIGH (ref 8–23)
BUN: 48 mg/dL — ABNORMAL HIGH (ref 8–23)
CO2: 36 mmol/L — ABNORMAL HIGH (ref 22–32)
CO2: 37 mmol/L — ABNORMAL HIGH (ref 22–32)
Calcium: 8.1 mg/dL — ABNORMAL LOW (ref 8.9–10.3)
Calcium: 8.3 mg/dL — ABNORMAL LOW (ref 8.9–10.3)
Chloride: 103 mmol/L (ref 98–111)
Chloride: 103 mmol/L (ref 98–111)
Creatinine, Ser: 1.01 mg/dL (ref 0.61–1.24)
Creatinine, Ser: 1 mg/dL (ref 0.61–1.24)
GFR, Estimated: >60 mL/min (ref >60)
GFR, Estimated: >60 mL/min (ref >60)
Glucose, Bld: 135 mg/dL — ABNORMAL HIGH (ref 70–99)
Glucose, Bld: 158 mg/dL — ABNORMAL HIGH (ref 70–99)
Potassium: 4.1 mmol/L (ref 3.5–5.1)
Potassium: 3.7 mmol/L (ref 3.5–5.1)
Sodium: 148 mmol/L — ABNORMAL HIGH (ref 135–145)
Sodium: 148 mmol/L — ABNORMAL HIGH (ref 135–145)

## 2021-06-10 LAB — HEPATIC FUNCTION PANEL
ALT: 70 U/L — ABNORMAL HIGH (ref 0–44)
AST: 31 U/L (ref 15–41)
Albumin: 2.9 g/dL — ABNORMAL LOW (ref 3.5–5.0)
Alkaline Phosphatase: 78 U/L (ref 38–126)
Bilirubin, Direct: 0.2 mg/dL (ref 0.0–0.2)
Indirect Bilirubin: 0.3 mg/dL (ref 0.3–0.9)
Total Bilirubin: 0.5 mg/dL (ref 0.3–1.2)
Total Protein: 4.8 g/dL — ABNORMAL LOW (ref 6.5–8.1)

## 2021-06-10 LAB — BPAM RBC
Blood Product Expiration Date: 202303132359
Blood Product Expiration Date: 202303132359
Blood Product Expiration Date: 202303132359
Blood Product Expiration Date: 202303182359
ISSUE DATE / TIME: 202302140809
Unit Type and Rh: 5100
Unit Type and Rh: 5100
Unit Type and Rh: 5100
Unit Type and Rh: 5100

## 2021-06-10 LAB — CBC
HCT: 29.5 % — ABNORMAL LOW (ref 39.0–52.0)
HCT: 30.9 % — ABNORMAL LOW (ref 39.0–52.0)
Hemoglobin: 9.3 g/dL — ABNORMAL LOW (ref 13.0–17.0)
Hemoglobin: 9.7 g/dL — ABNORMAL LOW (ref 13.0–17.0)
MCH: 30.4 pg (ref 26.0–34.0)
MCH: 30.7 pg (ref 26.0–34.0)
MCHC: 31.5 g/dL (ref 30.0–36.0)
MCHC: 31.4 g/dL (ref 30.0–36.0)
MCV: 96.4 fL (ref 80.0–100.0)
MCV: 97.8 fL (ref 80.0–100.0)
Platelets: 102 K/uL — ABNORMAL LOW (ref 150–400)
Platelets: 103 10*3/uL — ABNORMAL LOW (ref 150–400)
RBC: 3.06 MIL/uL — ABNORMAL LOW (ref 4.22–5.81)
RBC: 3.16 MIL/uL — ABNORMAL LOW (ref 4.22–5.81)
RDW: 18 % — ABNORMAL HIGH (ref 11.5–15.5)
RDW: 18.1 % — ABNORMAL HIGH (ref 11.5–15.5)
WBC: 11.8 K/uL — ABNORMAL HIGH (ref 4.0–10.5)
WBC: 12 10*3/uL — ABNORMAL HIGH (ref 4.0–10.5)
nRBC: 0 % (ref 0.0–0.2)
nRBC: 0 % (ref 0.0–0.2)

## 2021-06-10 LAB — GLUCOSE, CAPILLARY
Glucose-Capillary: 140 mg/dL — ABNORMAL HIGH (ref 70–99)
Glucose-Capillary: 128 mg/dL — ABNORMAL HIGH (ref 70–99)
Glucose-Capillary: 121 mg/dL — ABNORMAL HIGH (ref 70–99)
Glucose-Capillary: 153 mg/dL — ABNORMAL HIGH (ref 70–99)
Glucose-Capillary: 165 mg/dL — ABNORMAL HIGH (ref 70–99)

## 2021-06-10 LAB — APTT
aPTT: 43 s — ABNORMAL HIGH (ref 24–36)
aPTT: 41 seconds — ABNORMAL HIGH (ref 24–36)
aPTT: 61 seconds — ABNORMAL HIGH (ref 24–36)

## 2021-06-10 LAB — PROTIME-INR
INR: 2.1 — ABNORMAL HIGH (ref 0.8–1.2)
Prothrombin Time: 23.8 s — ABNORMAL HIGH (ref 11.4–15.2)

## 2021-06-10 LAB — FIBRINOGEN: Fibrinogen: 350 mg/dL (ref 210–475)

## 2021-06-10 LAB — LACTIC ACID, PLASMA: Lactic Acid, Venous: 1.2 mmol/L (ref 0.5–1.9)

## 2021-06-10 LAB — MAGNESIUM: Magnesium: 2 mg/dL (ref 1.7–2.4)

## 2021-06-10 LAB — LACTATE DEHYDROGENASE: LDH: 223 U/L — ABNORMAL HIGH (ref 98–192)

## 2021-06-10 MED ORDER — ACETAZOLAMIDE 250 MG PO TABS
250.0000 mg | ORAL_TABLET | Freq: Once | ORAL | Status: AC
Start: 2021-06-10 — End: 2021-06-10
  Administered 2021-06-10: 250 mg
  Filled 2021-06-10: qty 1

## 2021-06-10 MED ORDER — DIPHENHYDRAMINE HCL 50 MG/ML IJ SOLN
25.0000 mg | Freq: Four times a day (QID) | INTRAMUSCULAR | Status: DC | PRN
  Administered 2021-06-10 – 2021-06-14 (×6): 25 mg via INTRAVENOUS
  Filled 2021-06-10 (×6): qty 1

## 2021-06-10 MED ORDER — DEXTROSE 5 % IV SOLN
4.0000 mg/h | INTRAVENOUS | Status: DC
  Administered 2021-06-11: 5 mg/h via INTRAVENOUS
  Administered 2021-06-12: 4 mg/h via INTRAVENOUS
  Filled 2021-06-10 (×3): qty 20

## 2021-06-10 NOTE — Progress Notes (Signed)
°   06/10/21 1347  Clinical Encounter Type  Visited With Health care provider  Visit Type Initial  Referral From Nurse  Consult/Referral To Chaplain   Paged to meet with patient's wife. Referred call to [place]. Chaplain Nashia Remus, M.Min., [phone].

## 2021-06-10 NOTE — Progress Notes (Signed)
This chaplain was updated by the Pt. RN-Nicole before the chaplain visit with the Pt. wife-Pam. The chaplain understands and joined Pam in the waiting room after an update from Dr. Tamala Julian.   The chaplain greets Pam and gives her the space to share her emotions and reflect on "the Pt. lungs are not improving."  Pam shares she is communicating with family and asking for prayers with "Monday as the day for the next CT scan."  The chaplain understands Pam is hopeful for healing and accepting of God's will.  Pam is expecting the Pt. brother-Randy to visit today and the Pt. children to be present on Monday. Pam's request is to continue prayer for the Pt.  The chaplain prayed with Pam and walked back to the room. Pam is eagerly anticipating the Pt. PT visit.  Chaplain Sallyanne Kuster [phone]

## 2021-06-10 NOTE — Progress Notes (Signed)
Patient ID: Randall Hodges, male   DOB: 1951/01/25, 71 y.o.   MRN: [id number]    Progress Note  Patient Name: Randall Hodges Date of Encounter: 06/10/2021  [facility] Cardiologist: [name], MD   Subjective   2/2: VV ECMO initiation, Crescent catheter right IJ 2/7: Tracheostomy 2/13 CT C/A/P: Diffuse ground-glass opacities consistent with atelectasis and edema presumably related to ECMO physiology and respiratory failure.  Underlying moderate interstitial lung disease mildly progressed from prior CT. 2/15: Oxygenator changed, trach changed.  2/16: Transient asystole with cough and adjustment of tracheostomy overnight.  2/17: Atrial flutter with RVR, required emergent DCCV and amiodarone restarted  CXR with severe bilateral diffuse disease, starting to improve.   Remains on vancomycin.  Suspect aspiration again with TFs in trach on 2/16, NGT placed to suction and also has post-pyloric tube for feeds.    Vent FiO2 0.3. Vt 120 cc.  CVP 10 this morning, on Lasix gtt with excellent diuresis (net negative 1.1 L).  Creatinine and BUN stable.  No chugging.   NSR this morning on amiodarone gtt.   Patient has been working with PT.   ECMO: Speed 3350 rpm Flow 4.6 L/min pVen -85 DeltaP 25 Sweep 4 PTT 43 (goal 50-80) LDH 223 ABG 7.40/63/73/94% Lactate 1.2   Cardiac Studies: Echo (limited, 1/30): Echo reviewed, there is clear respirophasic variation of the interventricular septum and marked respirophasic variation of E inflow velocity on doppler evaluation of the mitral valve.  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  LV EF 45-50%.   RHC Procedural Findings (on norepinephrine 6): Hemodynamics (mmHg) RA mean 12 RV 37/12 PA 38/16, mean 27 PCWP mean 11 LV 108/12 AO 96/55 PAPI 1.8 Oxygen  saturations: PA 54% AO 94% Cardiac Output (Fick) 5.59  Cardiac Index (Fick) 2.81  PVR 2.8 WU Simultaneous RV/LV tracings were obtained.  Difficult to interpret due to atrial fibrillation.  There was some suggestion of discordance (ventricular interdependence) but not clear.  Inpatient Medications    Scheduled Meds:  acetaZOLAMIDE  250 mg Per Tube Once   aspirin  81 mg Per Tube Daily   baclofen  5 mg Per Tube TID   chlorhexidine gluconate (MEDLINE KIT)  15 mL Mouth Rinse BID   Chlorhexidine Gluconate Cloth  6 each Topical Daily   clonazePAM  1 mg Per Tube BID   colchicine  0.6 mg Per Tube Daily   feeding supplement (PROSource TF)  90 mL Per Tube BID   free water  200 mL Per Tube Q4H   gabapentin  300 mg Per Tube QHS   Gerhardt's butt cream   Topical TID   insulin aspart  0-15 Units Subcutaneous Q4H   mouth rinse  15 mL Mouth Rinse 10 times per day   melatonin  5 mg Per Tube QHS   methylPREDNISolone (SOLU-MEDROL) injection  20 mg Intravenous Q24H   metoCLOPramide (REGLAN) injection  5 mg Intravenous Q8H   morphine  30 mg Per Tube Q6H   pantoprazole sodium  40 mg Per Tube BID   potassium chloride  40 mEq Per Tube BID   QUEtiapine  25 mg Per Tube QHS   rosuvastatin  10 mg Per Tube Daily   sodium chloride flush  10-40 mL Intracatheter Q12H   sodium chloride flush  10-40 mL Intracatheter Q12H   sodium chloride flush  3 mL Intravenous  Q12H   Continuous Infusions:  sodium chloride Stopped (06/01/21 1121)   sodium chloride 10 mL/hr at 06/10/21 0600   sodium chloride 10 mL/hr at 06/09/21 1806   albumin human 12.5 g (06/08/21 1704)   albumin human 12.5 g (06/08/21 1820)   amiodarone 30 mg/hr (06/10/21 0600)   bivalirudin (ANGIOMAX) infusion 0.5 mg/mL (Non-ACS indications) 0.0802 mg/kg/hr (06/10/21 0600)   dexmedetomidine (PRECEDEX) IV infusion Stopped (06/05/21 0655)   feeding supplement (VITAL 1.5 CAL) 1,000 mL (06/09/21 1615)   fentaNYL infusion INTRAVENOUS 25 mcg/hr (06/10/21  0600)   furosemide (LASIX) 200 mg in dextrose 5% 100 mL (35m/mL) infusion 8 mg/hr (06/10/21 0600)   vancomycin Stopped (06/09/21 1009)   PRN Meds: Place/Maintain arterial line **AND** sodium chloride, sodium chloride, sodium chloride, acetaminophen (TYLENOL) oral liquid 160 mg/5 mL, albumin human, albumin human, docusate, fentaNYL, fentaNYL (SUBLIMAZE) injection, fentaNYL (SUBLIMAZE) injection, guaiFENesin, hydrALAZINE, levalbuterol, lip balm, ondansetron (ZOFRAN) IV, polyvinyl alcohol, sennosides, sodium chloride flush, white petrolatum   Vital Signs    Vitals:   06/10/21 0345 06/10/21 0400 06/10/21 0500 06/10/21 0600  BP: 125/71 120/65 (!) 146/76 132/64  Pulse: 82 80 74 80  Resp: 10 (!) _0 Temp:  98.2 F (36.8 C)    TempSrc:      SpO2: 100% 100% 95% 93%  Weight:   87.1 kg   Height:        Intake/Output Summary (Last 24 hours) at 06/10/2021 0740 Last data filed at 06/10/2021 0600 Gross per 24 hour  Intake 5496.5 ml  Output 6645 ml  Net -1148.5 ml   Last 3 Weights 06/10/2021 06/09/2021 06/08/2021  Weight (lbs) 192 lb 0.3 oz 195 lb 15.8 oz 193 lb 9 oz  Weight (kg) 87.1 kg 88.9 kg 87.8 kg      Telemetry    NSR, personally reviewed.   Physical Exam   General: NAD Neck: RIJ Crescent catheter, no thyromegaly or thyroid nodule.  Lungs: Decreased BS at bases.  CV: Nondisplaced PMI.  Heart regular S1/S2, no S3/S4, no murmur.  1+ edema to knees.  Abdomen: Soft, nontender, no hepatosplenomegaly, no distention.  Skin: Intact without lesions or rashes.  Neurologic: Wakes up, follows commands.  Extremities: No clubbing or cyanosis.  HEENT: Normal.    Labs    High Sensitivity Troponin:   Recent Labs  Lab 06/05/21 2208 06/06/21 0037 06/06/21 0532  TROPONINIHS 211* 226* 196*      Chemistry Recent Labs  Lab 06/05/21 2208 06/06/21 0353 06/08/21 0444 06/08/21 0657 06/08/21 0804 06/09/21 0407 06/09/21 0410 06/09/21 1617 06/09/21 1804 06/10/21 0233  06/10/21 0455 06/10/21 0559  NA 144   < > 146*  --    < > 146*   < > 147*   < > 148* 147* 146*  K 3.4*   < > 4.2  --    < > 4.1   < > 3.9   < > 4.1 3.9 3.9  CL 110   < > 105  --    < > 104  --  105  --  103  --   --   CO2 25   < > 31  --    < > 33*  --  36*  --  36*  --   --   GLUCOSE 175*   < > 167*  --    < > 138*  --  152*  --  135*  --   --   BUN 58*   < >  56*  --    < > 52*  --  51*  --  50*  --   --   CREATININE 1.04   < > 1.09  --    < > 1.10  --  1.04  --  1.01  --   --   CALCIUM 7.8*   < > 8.0*  --    < > 8.3*  --  8.2*  --  8.1*  --   --   MG 2.0  --   --  2.1  --   --   --   --   --  2.0  --   --   PROT  --    < > 4.9*  --   --  4.8*  --   --   --  4.8*  --   --   ALBUMIN  --    < > 2.9*  --   --  3.1*  --   --   --  2.9*  --   --   AST  --    < > 58*  --   --  39  --   --   --  31  --   --   ALT  --    < > 90*  --   --  77*  --   --   --  70*  --   --   ALKPHOS  --    < > 97  --   --  82  --   --   --  78  --   --   BILITOT  --    < > 0.5  --   --  0.5  --   --   --  0.5  --   --   GFRNONAA >60   < > >60  --    < > >60  --  >60  --  >60  --   --   ANIONGAP 9   < > 10  --    < > 9  --  6  --  9  --   --    < > = values in this interval not displayed.    Lipids  No results for input(s): CHOL, TRIG, HDL, LABVLDL, LDLCALC, CHOLHDL in the last 168 hours.   Hematology Recent Labs  Lab 06/09/21 0407 06/09/21 0410 06/09/21 1617 06/09/21 1804 06/10/21 0233 06/10/21 0455 06/10/21 0559  WBC 9.3  --  12.2*  --  11.8*  --   --   RBC 3.01*  --  3.14*  --  3.06*  --   --   HGB 8.9*   < > 9.5*   < > 9.3* 9.5* 9.9*  HCT 28.6*   < > 30.1*   < > 29.5* 28.0* 29.0*  MCV 95.0  --  95.9  --  96.4  --   --   MCH 29.6  --  30.3  --  30.4  --   --   MCHC 31.1  --  31.6  --  31.5  --   --   RDW 18.0*  --  18.3*  --  18.0*  --   --   PLT 104*  --  116*  --  102*  --   --    < > = values in this interval not displayed.   Thyroid No results for input(s): TSH, FREET4 in the last 168 hours.  BNPNo results for input(s): BNP, PROBNP in the last 168 hours.  DDimer No results for input(s): DDIMER in the last 168 hours.   Radiology    DG CHEST PORT 1 VIEW  Result Date: 06/09/2021 CLINICAL DATA:  Lurline Idol present on ECMO EXAM: PORTABLE CHEST 1 VIEW COMPARISON:  Radiograph 06/08/2021 FINDINGS: Tracheostomy tube overlies the midthoracic trachea. Unchanged obscured cardiomediastinal silhouette with prior median sternotomy and CABG. Unchanged ECMO cannula. Feeding tube passes below the diaphragm, tip excluded by collimation. The other nasogastric tube tip and side port overlies the stomach advanced since the prior exam. Fibular pads overlie the left lower chest. No significant change in diffuse airspace disease and bilateral pleural effusions. There is no visible pneumothorax. Unchanged right upper rib fracture. IMPRESSION: No significant change in diffuse airspace disease. Electronically Signed   By: Maurine Simmering M.D.   On: 06/09/2021 08:20    Cardiac Studies   Cath 05/10/2021 Distal left main [place] 111 bifurcation stenosis with 75% left main, 90% ostial to proximal LAD, and 80-90% ostial circumflex (difficult to assess due to heavy calcification). Severe mid circumflex disease with 70% eccentric mid stenosis and second obtuse marginal containing ostial to proximal greater than 80% stenosis.  (Bifurcation [place] 111 Severe calcification in left main and LAD in particular with diffuse 50% narrowing from proximal to mid vessel and tandem 70% stenoses in the mid LAD. Nondominant right coronary Normal LV function.  EF 55%.  LVEDP normal.    Patient Profile     71 y.o. male with PMH of PVCs presented with chest pain. Cardiac cath by Dr. Tamala Julian on 05/11/2021 showed 75% left main, 90% ost to prox LAD, 80-90% ost LCx, 70% mid LCx, 80% OM2, 50% prox to mid LAD, 70% mid LAD, EF 55%. Patient underwent CABG x 3 on 05/03/2021. Post op course complicated afib, treated with amio. CXR showed opacity in  bilateral lung, started abx on 1/25 and diuretic. Started on Eliquis due to recurrence of afib.    Assessment & Plan    1. CAD: Admitted with unstable angina, cath with severe left main and proximal LAD/LCx disease (nondominant RCA).  CABG x 3 on 1/18 with LIMA-LAD, SVG-OM, SVG-left PDA.   - Continue ASA 81, statin.  2. 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 RHC 1/30 with equalization of diastolic pressures.  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  Repeated echo 2/2 still showed respirophasic septal variation but not as impressive. CVP 9-10 today.  Lungs still with diffuse bilateral infiltrates on CXR but improving.  Diuresed well with IV Lasix gtt yesterday, BUN/creatinine stable.  - Can decrease Lasix to 6 mg/hr and add acetazolamide 250 mg po x 1 today.  - With concern for development of post-surgical effusive/constrictive pericarditis, he is on colchicine.  - ?need for surgical intervention on effusive/constrictive pericarditis. Will need improvement of lung disease first then reassess, repeat echo not as impressive.   3. 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  Suspect primarily septic/vasodilatory shock. He is now off pressors.  4. Hiccups: Intractable initially, now improved.  - On baclofen and gabapentin.  5. PNA with acute hypoxic respiratory failure: Of note, he does appear to  have had some pre-existing ILD from 2019 CT chest (?sarcoidosis). CXR with persistent bilateral infiltrates, possible mild improvement compared to  yesterday.  Have thought most likely aspiration PNA/pneumonitis in setting of intractable hiccups. He has developed ARDS. COVID was negative.  FiO2 0.5 on vent.  CXR with bilateral diffuse airspace disease, unchanged. Has tracheostomy. CT chest 2/9 with persistent lung infiltrates.  Completed vancomycin and meropenem => MRSE 1/2 blood and trach aspirate, now back on vancomycin.  Suspect ongoing aspiration with TFs noted in trach on 2/16, now with NGT to suction and post-pyloric feeding tube.  LDH stable today. Stable ECMO circuit.  FiO2 0.3 with higher tidal volumes.  Stable ABG. Sweep down to 4.  - Vent and sedation per CCM.  - Solumedrol 20 mg IV daily with gradual taper, ESR now normal.  - Back on amiodarone but suspect not amiodarone lung toxicity.   - Complete 7 days vancomycin.  - Repeat CT chest next Monday to look for any improvement.  At that point, will need discussions about future plan.  6. Atypical atrial flutter/PVCs:  DCCV 2/17.  NSR this morning.   - Continue IV amiodarone.    - Bivalirudin gtt, goal PTT 50-80.  7. AKI: Creatinine stable 1.01. Follow closely.  8. Elevated LFTs: Suspect shock liver, follow CMET.  LFTs trending down.  9. Anemia: Transfuse hgb < 8.     10. Hypernatremia: Na 148 today.  - Continue free water 200 q4.  11. HTN: BP rises with sedation wean.  - prn hydralazine for now.  12. Bradycardia/asystole: Vagally mediated with cough.  Resolves rapidly with resolution of cough.  - Atropine at bedside.  13. FEN: TFs restarted with post-pyloric tube and also NGT to suction to prevent aspiration.   - Continue Reglan.   14. Mobilize as much as possible with PT.  Limit sedation.   CRITICAL CARE Performed by: Loralie Champagne  Total critical care time: 45 minutes  Critical care time was exclusive of separately billable procedures and treating other patients.  Critical care was necessary to treat or prevent imminent or life-threatening deterioration.  Critical care  was time spent personally by me on the following activities: development of treatment plan with patient and/or surrogate as well as nursing, discussions with consultants, evaluation of patient's response to treatment, examination of patient, obtaining history from patient or surrogate, ordering and performing treatments and interventions, ordering and review of laboratory studies, ordering and review of radiographic studies, pulse oximetry and re-evaluation of patient's condition.  Loralie Champagne MD 06/10/2021 7:40 AM

## 2021-06-10 NOTE — Progress Notes (Signed)
Patient noted to have rash on trunk, lateral sides and back.  Notified Dr. Tamala Julian as well as pharmacy.

## 2021-06-10 NOTE — Progress Notes (Signed)
NAME:  Randall Hodges, MRN:  [id number], DOB:  1951/02/03, LOS: 88 ADMISSION DATE:  04/30/2021, CONSULTATION DATE: 05/13/2021 REFERRING MD:  Dahlia Byes, MD, CHIEF COMPLAINT: Increasing shortness of breath  History of Present Illness:  71 year old male with coronary artery disease who initially presented with chest pain, noted to have multivessel coronary artery disease, he underwent CABG x3 on 04/1818 23, course was complicated with atrial fibrillation, frequent hiccups and aspiration pneumonia leading to hypoxia and increasing oxygen requirement. PCCM was consulted for evaluation and help with management  Patient stated hiccups are better but continued complain of shortness of breath, cough unable to bring up phlegm.  Pertinent  Medical History  CAD Allergic rhinitis Prostate cancer GERD Pulmonary fibrosis   Significant Hospital Events: Including procedures, antibiotic start and stop dates in addition to other pertinent events   1/18 CABG 1/30 PCCM consult, Worsening O2 needs, likely aspiration, new pressor need 2/2 intubation, placed on VV ECMO 2/5 issues with recirc, bronch neg, improved with supine positioning 2/7 percutaneous tracheostomy 2/10 diuresis and weaning sedation. CT chest shows diffuse ground-glass opacification.  2/11 weaning sedation 2/13 increasing air hunger as oxygenator efficiency decreasing.  2/14 oxygenator changed.  Still having air hunger but able to follow commands and participate with therapy.  2/15 considerable distress with double stacking. Switched to true rest settings - with improvement in distress.  2/16 bronch, NGT placed to LIWS, con't post-pyloric TF 2/17 DCCV overnight for Afib with RVR. Stood at bedside with PT.  Interim History / Subjective:  No events, sweep around 4 to 4.5LPM Diuresed well yesterday. Nodding head "yes" to pain.  Objective   Blood pressure 132/64, pulse 80, temperature 98.2 F (36.8 C), resp. rate 19, height 5\' 10"   (1.778 m), weight 87.1 kg, SpO2 93 %. CVP:  [4 mmHg-9 mmHg] 4 mmHg  Vent Mode: PCV FiO2 (%):  [30 %-50 %] 30 % Set Rate:  [10 bmp] 10 bmp PEEP:  [18 cmH20] 18 cmH20 Plateau Pressure:  [27 cmH20-31 cmH20] 27 cmH20   Intake/Output Summary (Last 24 hours) at 06/10/2021 0715 Last data filed at 06/10/2021 0600 Gross per 24 hour  Intake 5496.5 ml  Output 6645 ml  Net -1148.5 ml    Filed Weights   06/08/21 0600 06/09/21 0500 06/10/21 0500  Weight: 87.8 kg 88.9 kg 87.1 kg   Examination: Intermittent agonal breathing When pulling he can get 150cc on 10/18 Ext trace edema Has some almost purulent appearing secretions around tracheal stoma Lungs with minimal air movement Moves all 4 ext to command Bedside US today (2/22) with small-moderate left effusion simple appearing  Sodium up a bit Cr ok CBG ok H/H okay Plts okay CXR better aeration with higher PEEP  Ancillary tests personally reviewed:     Assessment & Plan:   Acute hypoxic and hypercapnic respiratory failure with hypoxia ARDS Aspiration pneumonitis- recurrent, now with NGT to suction, on reglan, getting postpyloric feeds Hiccups, recurrent aspiration episodes.  ILD, mild- question of chronic aspiration syndrome Agitation, ICU delirium, and air hunger- issue leading to fentanyl need, stable CAD with UA s/p CABG x 3, (LIMA-LAD, SVG-OM, SVG-left PDA) NSVT Afib with RVR, DCCV, back in NSR Hypertension Possible GPC bacteremia vs contaminant- staph epi in 2/4 bottles- on 7 days vanc - Lung protective TV - Sweep wean to pH > 7.35 and/or WOB - NG to LIS, cortrak for PP TF, reglan - VV ECMO and diuresis management per CHF team -con't baclofen, morphine, seroquel, klonipin, melatonin and gabapentin; wean  down fentanyl drip as able, still intermittent issue due to reported patient pain - colchicine per TCTS - bowel regimen as ordered, rectal management system in place - Continue PT/OT efforts - PPI BID - Finish out trial  of steroids, did not seem to have effect - Aspirin, statin - Holding coreg due to bradycardia - Continue IV amiodarone for frequent ectopy and questionable GI absorption - Hydralazine PRN; can add scheduled if needed  Continue current aggressive support until 2/27 and get a repeat CT that day.  If no improvement in GGO or there are fibrotic changes, we will need to consider setting a deadline for continued ECMO support due to futility.  Best Practice (right click and "Reselect all SmartList Selections" daily)   Diet/type: TF DVT prophylaxis: Bival CBG: SSI GI prophylaxis: PPI Lines: L [place] TLC, RIJ  Foley:  Yes Code Status:  full code Last date of multidisciplinary goals of care discussion [2/16]    Patient critically ill due to resp failure, delirium Interventions to address this today sedation and vent titration Risk of deterioration without these interventions is high  I personally spent 33 minutes providing critical care not including any separately billable procedures  Erskine Emery MD [place] [facility]  Prefer epic messenger for cross cover needs If after hours, please call E-link

## 2021-06-10 NOTE — Progress Notes (Signed)
Physical Therapy Treatment Patient Details Name: Randall Hodges MRN: [id number] DOB: 27-Jun-1950 Today's Date: 06/10/2021   History of Present Illness 71 yo male with CAD who initially presented with CP, noted to have multivessel CAD, he underwent CABG x3 on 05/09/2021, course was complicated with atrial fibrillation, frequent hiccups and aspiration PNA leading to hypoxia and increasing oxygen requirement. ETT 1/18-1/18. 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 Cortrak 1/30. 2/2 placed on VV ECMO. 2/7 trach. 2/14 ECMO circuit replacement. PMH: prostate CA    PT Comments    Pt moving spontaneously upon entry, shaking head "no," to if he wanted physical therapy. Throughout rest of session, pt remained lethargic with limited command following, despite minimal sedation. Performed tilt to 47 degrees x 25 minutes for BLE weightbearing. Performed PROM exercises and cervical stretching while upright. Played Beatles music for engagement. Will continue to progress as tolerated.     Recommendations for follow up therapy are one component of a multi-disciplinary discharge planning process, led by the attending physician.  Recommendations may be updated based on patient status, additional functional criteria and insurance authorization.  Follow Up Recommendations  Acute inpatient rehab (3hours/day)     Assistance Recommended at Discharge Frequent or constant Supervision/Assistance  Patient can return home with the following A lot of help with walking and/or transfers;A lot of help with bathing/dressing/bathroom;Two people to help with walking and/or transfers;Two people to help with bathing/dressing/bathroom;Assistance with feeding;Assistance with cooking/housework;Direct supervision/assist for medications management;Direct supervision/assist for financial management;Assist for transportation;Help with stairs or ramp for entrance   Equipment Recommendations  BSC/3in1;Wheelchair (measurements  PT);Wheelchair cushion (measurements PT);Hospital bed;Other (comment) (hoyer lift)    Recommendations for Other Services       Precautions / Restrictions Precautions Precautions: Fall;Sternal Precaution Booklet Issued: No Precaution Comments: ECMO, cortrak, foley, aline, sternal precautions, flexiseal, NG tube, trach on vent Restrictions Weight Bearing Restrictions: Yes Other Position/Activity Restrictions: sternal precautions     Mobility  Bed Mobility Overal bed mobility: Needs Assistance             General bed mobility comments: Tilt ~25 minutes to 47 degrees    Transfers                        Ambulation/Gait                   Stairs             Wheelchair Mobility    Modified Rankin (Stroke Patients Only)       Balance                                            Cognition Arousal/Alertness: Lethargic Behavior During Therapy: Flat affect Overall Cognitive Status: Difficult to assess                                 General Comments: Pt following < 10% of 1 step commands i.e. open mouth, stick out tongue, thumbs up on L side. Was initially alert and shaking head "no" to if he wanted PT, then remained lethargic throughout rest of session with decreased spontaneous movements or command following.        Exercises General Exercises - Upper Extremity Shoulder Flexion: PROM, Both, 10 reps, Supine Elbow Flexion: PROM, Both, 10 reps,  Supine Digit Composite Flexion: PROM, 10 reps, Both, Supine Composite Extension: PROM, Both, 10 reps, Supine General Exercises - Lower Extremity Ankle Circles/Pumps: PROM, Both, 10 reps, Supine Heel Slides: PROM, Both, 10 reps, Supine Hip ABduction/ADduction: PROM, Both, 10 reps, Supine Cervical stretch into left lateral flexion and rotation     General Comments General comments (skin integrity, edema, etc.): HR 75, SpO2 90%, 134/72 (98)      Pertinent Vitals/Pain  Pain Assessment Pain Assessment: CPOT Facial Expression: Relaxed, neutral Body Movements: Absence of movements Muscle Tension: Relaxed Compliance with ventilator (intubated pts.): Tolerating ventilator or movement Vocalization (extubated pts.): N/A CPOT Total: 0    Home Living                          Prior Function            PT Goals (current goals can now be found in the care plan section) Acute Rehab PT Goals Patient Stated Goal: pt wife would like him to participate in therapy as much as possible Potential to Achieve Goals: Fair Progress towards PT goals: Not progressing toward goals - comment    Frequency    Min 3X/week      PT Plan Current plan remains appropriate    Co-evaluation              AM-PAC PT "6 Clicks" Mobility   Outcome Measure  Help needed turning from your back to your side while in a flat bed without using bedrails?: Total Help needed moving from lying on your back to sitting on the side of a flat bed without using bedrails?: Total Help needed moving to and from a bed to a chair (including a wheelchair)?: Total Help needed standing up from a chair using your arms (e.g., wheelchair or bedside chair)?: Total Help needed to walk in hospital room?: Total Help needed climbing 3-5 steps with a railing? : Total 6 Click Score: 6    End of Session   Activity Tolerance: Patient limited by lethargy Patient left: in bed;with call bell/phone within reach;with nursing/sitter in room Nurse Communication: Mobility status PT Visit Diagnosis: Muscle weakness (generalized) (M62.81);Other abnormalities of gait and mobility (R26.89);Difficulty in walking, not elsewhere classified (R26.2);Unsteadiness on feet (R26.81)     Time: 1433-1510 PT Time Calculation (min) (ACUTE ONLY): 37 min  Charges:  $Therapeutic Exercise: 8-22 mins $Therapeutic Activity: 8-22 mins                     Wyona Almas, PT, DPT Acute Rehabilitation Services Pager  [phone] Office [phone]    Deno Etienne 06/10/2021, 5:12 PM

## 2021-06-10 NOTE — Progress Notes (Signed)
Patient ID: Randall Hodges, male   DOB: 1950/09/23, 71 y.o.   MRN: [id number] Extracorporeal support note  ECLS support day: 19 Indication: ARDS  Configuration: VV  Drainage cannula: Crescent R IJ Return cannula: Crescent R IJ  Pump speed: 3350 rpm Pump flow: 4.6 L/min Pump used: Cardiohelp  Sweep gas: 4   Circuit check: Good color change. LDH stable 223.  Anticoagulant: Bivalirudin, PTT goal 50-80. PTT 43  Changes in support: No changes today.   Anticipated goals/duration of support: Wean to discontinuation  Loralie Champagne, MD  7:39 AM

## 2021-06-10 NOTE — Progress Notes (Signed)
ANTICOAGULATION CONSULT NOTE - Follow Up Consult  Pharmacy Consult for bivalirudin Indication:  ECMO  Labs: Recent Labs    06/08/21 0444 06/08/21 0804 06/09/21 0407 06/09/21 0410 06/09/21 1617 06/09/21 1804 06/10/21 0202 06/10/21 0233 06/10/21 0455  HGB 9.0*   < > 8.9*   < > 9.5*   < > 9.5* 9.3* 9.5*  HCT 28.5*   < > 28.6*   < > 30.1*   < > 28.0* 29.5* 28.0*  PLT 123*   < > 104*  --  116*  --   --  102*  --   APTT 61*   < > 54*  --  58*  --   --  43*  --   LABPROT 24.7*  --  24.8*  --   --   --   --  23.8*  --   INR 2.2*  --  2.2*  --   --   --   --  2.1*  --   CREATININE 1.09   < > 1.10  --  1.04  --   --  1.01  --    < > = values in this interval not displayed.    Assessment: 71yo male subtherapeutic on bivalirudin after several PTTs at goal; no infusion issues or signs of bleeding per RN.  Goal of Therapy:  aPTT 50-80 seconds   Plan:  Will increase bivalirudin infusion by 20% to 0.08 mg/kg//hr and check PTT with next scheduled labs.    Wynona Neat, PharmD, BCPS  06/10/2021,5:16 AM

## 2021-06-10 NOTE — Progress Notes (Signed)
ANTICOAGULATION CONSULT NOTE  Pharmacy Consult for Bivalirudin Indication:  ECMO  Allergies  Allergen Reactions   Amoxicillin-Pot Clavulanate     Per pt report on 08/19/20, makes his urine dark colored   Atorvastatin Other (See Comments)     ( pt states causing runny nose, headaches, issue w/ urination)   Plant Derived Enzymes     Other reaction(s): Unknown   Trichophyton Other (See Comments)    Patient Measurements: Height: 5\' 10"  (177.8 cm) Weight: 87.1 kg (192 lb 0.3 oz) IBW/kg (Calculated) : 73  Vital Signs: Temp: 98.1 F (36.7 C) (02/22 1200) Temp Source: Axillary (02/22 1200) BP: 128/55 (02/22 1700) Pulse Rate: 77 (02/22 1700)  Labs: Recent Labs    06/08/21 0444 06/08/21 0804 06/09/21 0407 06/09/21 0410 06/09/21 1617 06/09/21 1804 06/10/21 0233 06/10/21 0455 06/10/21 0559 06/10/21 0757 06/10/21 0758 06/10/21 1643 06/10/21 1651  HGB 9.0*   < > 8.9*   < > 9.5*   < > 9.3*   < > 9.9*  --  10.2*  --  9.5*  HCT 28.5*   < > 28.6*   < > 30.1*   < > 29.5*   < > 29.0*  --  30.0*  --  28.0*  PLT 123*   < > 104*  --  116*  --  102*  --   --   --   --   --   --   APTT 61*   < > 54*  --  58*  --  43*  --   --  41*  --  61*  --   LABPROT 24.7*  --  24.8*  --   --   --  23.8*  --   --   --   --   --   --   INR 2.2*  --  2.2*  --   --   --  2.1*  --   --   --   --   --   --   CREATININE 1.09   < > 1.10  --  1.04  --  1.01  --   --   --   --  1.00  --    < > = values in this interval not displayed.     Estimated Creatinine Clearance: 70 mL/min (by C-G formula based on SCr of 1 mg/dL).  Assessment: 71 year old male with coronary artery disease who initially presented with chest pain, noted to have multivessel coronary artery disease. He underwent CABG x3 on 05/09/2021; course was complicated with atrial fibrillation, frequent hiccups and aspiration pneumonia leading to hypoxia and increasing oxygen requirement. Patient now requiring VV ECMO. Pharmacy consulted for  bivalirudin IV.  aPTT this afternoon therapeutic at 61 sec after new IV and increase in bivalirudin rate. Bleeding from NG tube stable.  Goal of Therapy:  aPTT 50-80 seconds Monitor platelets by anticoagulation protocol: Yes   Plan:  Continue bivalirudin at 0.085 mg/kg/hr Monitor q12h aptt and CBC Monitor closely for s/sx bleeding/thrombus  Manolito Jurewicz D. Mina Marble, PharmD, BCPS, [place] 06/10/2021, 5:30 PM

## 2021-06-10 NOTE — Progress Notes (Signed)
Around 2030, pt began having issues w chugging. Pt produced 3L in urine from 0700-2100 & CVP reading at 3. In addition, O2 sat persistently reading high 80s-low 90s; ABG confirms value. ES unable to increase ECMO flows to improve oxygenation d/t chugging--50 ml albumin given & lasix gtt paused. Dr. Aundra Dubin contacted, received verbal orders to hold lasix gtt for four hours and restart at 5 mg/hr, and to give 1 additional 50 ml albumin. Will resume lasix gtt at 5 mg/hr at 0200. Care ongoing.   0000: Increased bleeding noted coming out of trach & NGT to low intermittent suction. Suction turned off, called PharmD regarding bivalrudin gtt--advised to draw aPTT and continue gtt until aPTT results.   0130: aPTT still within normal range, PharmD advised to continue gtt at current rate. NGT still off at this time. Pt not currently having issues with coughing/gagging/hiccuping/vomiting, and output from NGT is bilious with blood mixed in. Will check residuals frequently, but it does not appear that aspirating tube feeds is an issue at this time.   0330: Increased bleeding now noted around ECMO cannulas. Hgb reading on pump ~1 point down from yesterday. AM labs drawn--lower Hgb confirmed & aPTT increased by 5 seconds within 4 hours. PharmD called to discuss potentially lowering rate of bivalrudin in setting of increased bleeding, orders to follow.   0630: When checking residual in NGT, no more blood noted, only green/brown bilious output. Pt reconnected to low intermittent suction again, monitoring closely for rebleeding.

## 2021-06-10 NOTE — Progress Notes (Signed)
This chaplain attempted F/U spiritual care with the Pt. wife-Pam. Pam is not at the bedside. The chaplain will attempt a revisit today.  Chaplain Sallyanne Kuster [phone]

## 2021-06-10 NOTE — Progress Notes (Signed)
ANTICOAGULATION CONSULT NOTE  Pharmacy Consult for Bivalirudin Indication:  ECMO  Allergies  Allergen Reactions   Amoxicillin-Pot Clavulanate     Per pt report on 08/19/20, makes his urine dark colored   Atorvastatin Other (See Comments)     ( pt states causing runny nose, headaches, issue w/ urination)   Plant Derived Enzymes     Other reaction(s): Unknown   Trichophyton Other (See Comments)    Patient Measurements: Height: 5\' 10"  (177.8 cm) Weight: 87.1 kg (192 lb 0.3 oz) IBW/kg (Calculated) : 73  Vital Signs: Temp: 98.1 F (36.7 C) (02/22 0747) Temp Source: Axillary (02/22 0747) BP: 133/62 (02/22 0747) Pulse Rate: 85 (02/22 0747)  Labs: Recent Labs    06/08/21 0444 06/08/21 0804 06/09/21 0407 06/09/21 0410 06/09/21 1617 06/09/21 1804 06/10/21 0233 06/10/21 0455 06/10/21 0559 06/10/21 0757 06/10/21 0758  HGB 9.0*   < > 8.9*   < > 9.5*   < > 9.3* 9.5* 9.9*  --  10.2*  HCT 28.5*   < > 28.6*   < > 30.1*   < > 29.5* 28.0* 29.0*  --  30.0*  PLT 123*   < > 104*  --  116*  --  102*  --   --   --   --   APTT 61*   < > 54*  --  58*  --  43*  --   --  41*  --   LABPROT 24.7*  --  24.8*  --   --   --  23.8*  --   --   --   --   INR 2.2*  --  2.2*  --   --   --  2.1*  --   --   --   --   CREATININE 1.09   < > 1.10  --  1.04  --  1.01  --   --   --   --    < > = values in this interval not displayed.     Estimated Creatinine Clearance: 69.3 mL/min (by C-G formula based on SCr of 1.01 mg/dL).   Medical History: Past Medical History:  Diagnosis Date   Allergic rhinitis    Prostate cancer ([place])     Medications:  Infusions:   sodium chloride Stopped (06/01/21 1121)   sodium chloride 10 mL/hr at 06/10/21 0600   sodium chloride 10 mL/hr at 06/09/21 1806   albumin human 12.5 g (06/08/21 1704)   albumin human 12.5 g (06/08/21 1820)   amiodarone 30 mg/hr (06/10/21 0600)   bivalirudin (ANGIOMAX) infusion 0.5 mg/mL (Non-ACS indications) 0.0802 mg/kg/hr (06/10/21 0600)    dexmedetomidine (PRECEDEX) IV infusion Stopped (06/05/21 0655)   feeding supplement (VITAL 1.5 CAL) 1,000 mL (06/09/21 1615)   fentaNYL infusion INTRAVENOUS 25 mcg/hr (06/10/21 0600)   furosemide (LASIX) 200 mg in dextrose 5% 100 mL (2mg /mL) infusion 6 mg/hr (06/10/21 0740)   vancomycin 1,750 mg (06/10/21 0840)    Assessment: 71 year old male with coronary artery disease who initially presented with chest pain, noted to have multivessel coronary artery disease. He underwent CABG x3 on 05/07/2021; course was complicated with atrial fibrillation, frequent hiccups and aspiration pneumonia leading to hypoxia and increasing oxygen requirement. Patient now requiring VV ECMO. Pharmacy consulted for bivalirudin IV.  aPTT this morning adjusted for low level. RNs concerned for bleeding from NG tube (present yesterday) - repeat remains low at 41 seconds. Will titrate bivalirudin further.  Goal of Therapy:  aPTT 50-80 seconds Monitor platelets  by anticoagulation protocol: Yes   Plan:  Increase bivalirudin at 0.085 mg/kg/hr Monitor q12h aptt and CBC Monitor closely for s/sx bleeding/thrombus   Arrie Senate, PharmD, BCPS, [facility] Clinical Pharmacist [phone] Please check AMION for all [place] numbers 06/10/2021

## 2021-06-11 ENCOUNTER — Inpatient Hospital Stay (HOSPITAL_COMMUNITY): Payer: PPO

## 2021-06-11 DIAGNOSIS — J9601 Acute respiratory failure with hypoxia: Secondary | ICD-10-CM | POA: Diagnosis not present

## 2021-06-11 DIAGNOSIS — Z9281 Personal history of extracorporeal membrane oxygenation (ECMO): Secondary | ICD-10-CM | POA: Diagnosis not present

## 2021-06-11 DIAGNOSIS — Z951 Presence of aortocoronary bypass graft: Secondary | ICD-10-CM | POA: Diagnosis not present

## 2021-06-11 DIAGNOSIS — I249 Acute ischemic heart disease, unspecified: Secondary | ICD-10-CM | POA: Diagnosis not present

## 2021-06-11 DIAGNOSIS — Z515 Encounter for palliative care: Secondary | ICD-10-CM | POA: Diagnosis not present

## 2021-06-11 DIAGNOSIS — J80 Acute respiratory distress syndrome: Secondary | ICD-10-CM | POA: Diagnosis not present

## 2021-06-11 LAB — POCT I-STAT 7, (LYTES, BLD GAS, ICA,H+H)
Acid-Base Excess: 10 mmol/L — ABNORMAL HIGH (ref 0.0–2.0)
Acid-Base Excess: 10 mmol/L — ABNORMAL HIGH (ref 0.0–2.0)
Acid-Base Excess: 10 mmol/L — ABNORMAL HIGH (ref 0.0–2.0)
Acid-Base Excess: 11 mmol/L — ABNORMAL HIGH (ref 0.0–2.0)
Acid-Base Excess: 8 mmol/L — ABNORMAL HIGH (ref 0.0–2.0)
Acid-Base Excess: 9 mmol/L — ABNORMAL HIGH (ref 0.0–2.0)
Bicarbonate: 36.4 mmol/L — ABNORMAL HIGH (ref 20.0–28.0)
Bicarbonate: 36.4 mmol/L — ABNORMAL HIGH (ref 20.0–28.0)
Bicarbonate: 36.5 mmol/L — ABNORMAL HIGH (ref 20.0–28.0)
Bicarbonate: 36.8 mmol/L — ABNORMAL HIGH (ref 20.0–28.0)
Bicarbonate: 37 mmol/L — ABNORMAL HIGH (ref 20.0–28.0)
Bicarbonate: 38 mmol/L — ABNORMAL HIGH (ref 20.0–28.0)
Calcium, Ion: 1.1 mmol/L — ABNORMAL LOW (ref 1.15–1.40)
Calcium, Ion: 1.22 mmol/L (ref 1.15–1.40)
Calcium, Ion: 1.22 mmol/L (ref 1.15–1.40)
Calcium, Ion: 1.23 mmol/L (ref 1.15–1.40)
Calcium, Ion: 1.24 mmol/L (ref 1.15–1.40)
Calcium, Ion: 1.24 mmol/L (ref 1.15–1.40)
HCT: 25 % — ABNORMAL LOW (ref 39.0–52.0)
HCT: 26 % — ABNORMAL LOW (ref 39.0–52.0)
HCT: 32 % — ABNORMAL LOW (ref 39.0–52.0)
HCT: 32 % — ABNORMAL LOW (ref 39.0–52.0)
HCT: 33 % — ABNORMAL LOW (ref 39.0–52.0)
HCT: 33 % — ABNORMAL LOW (ref 39.0–52.0)
Hemoglobin: 10.9 g/dL — ABNORMAL LOW (ref 13.0–17.0)
Hemoglobin: 10.9 g/dL — ABNORMAL LOW (ref 13.0–17.0)
Hemoglobin: 11.2 g/dL — ABNORMAL LOW (ref 13.0–17.0)
Hemoglobin: 11.2 g/dL — ABNORMAL LOW (ref 13.0–17.0)
Hemoglobin: 8.5 g/dL — ABNORMAL LOW (ref 13.0–17.0)
Hemoglobin: 8.8 g/dL — ABNORMAL LOW (ref 13.0–17.0)
O2 Saturation: 87 %
O2 Saturation: 90 %
O2 Saturation: 91 %
O2 Saturation: 93 %
O2 Saturation: 93 %
O2 Saturation: 97 %
Patient temperature: 36.6
Patient temperature: 36.7
Patient temperature: 36.7
Patient temperature: 36.8
Patient temperature: 36.8
Patient temperature: 36.8
Potassium: 4.1 mmol/L (ref 3.5–5.1)
Potassium: 4.2 mmol/L (ref 3.5–5.1)
Potassium: 4.3 mmol/L (ref 3.5–5.1)
Potassium: 4.5 mmol/L (ref 3.5–5.1)
Potassium: 4.5 mmol/L (ref 3.5–5.1)
Potassium: 4.6 mmol/L (ref 3.5–5.1)
Sodium: 146 mmol/L — ABNORMAL HIGH (ref 135–145)
Sodium: 146 mmol/L — ABNORMAL HIGH (ref 135–145)
Sodium: 146 mmol/L — ABNORMAL HIGH (ref 135–145)
Sodium: 147 mmol/L — ABNORMAL HIGH (ref 135–145)
Sodium: 147 mmol/L — ABNORMAL HIGH (ref 135–145)
Sodium: 148 mmol/L — ABNORMAL HIGH (ref 135–145)
TCO2: 38 mmol/L — ABNORMAL HIGH (ref 22–32)
TCO2: 38 mmol/L — ABNORMAL HIGH (ref 22–32)
TCO2: 38 mmol/L — ABNORMAL HIGH (ref 22–32)
TCO2: 39 mmol/L — ABNORMAL HIGH (ref 22–32)
TCO2: 39 mmol/L — ABNORMAL HIGH (ref 22–32)
TCO2: 40 mmol/L — ABNORMAL HIGH (ref 22–32)
pCO2 arterial: 58.9 mmHg — ABNORMAL HIGH (ref 32–48)
pCO2 arterial: 60.7 mmHg — ABNORMAL HIGH (ref 32–48)
pCO2 arterial: 63.6 mmHg — ABNORMAL HIGH (ref 32–48)
pCO2 arterial: 64 mmHg — ABNORMAL HIGH (ref 32–48)
pCO2 arterial: 64.5 mmHg — ABNORMAL HIGH (ref 32–48)
pCO2 arterial: 67.6 mmHg (ref 32–48)
pH, Arterial: 7.337 — ABNORMAL LOW (ref 7.35–7.45)
pH, Arterial: 7.36 (ref 7.35–7.45)
pH, Arterial: 7.366 (ref 7.35–7.45)
pH, Arterial: 7.383 (ref 7.35–7.45)
pH, Arterial: 7.391 (ref 7.35–7.45)
pH, Arterial: 7.397 (ref 7.35–7.45)
pO2, Arterial: 101 mmHg (ref 83–108)
pO2, Arterial: 58 mmHg — ABNORMAL LOW (ref 83–108)
pO2, Arterial: 59 mmHg — ABNORMAL LOW (ref 83–108)
pO2, Arterial: 64 mmHg — ABNORMAL LOW (ref 83–108)
pO2, Arterial: 68 mmHg — ABNORMAL LOW (ref 83–108)
pO2, Arterial: 70 mmHg — ABNORMAL LOW (ref 83–108)

## 2021-06-11 LAB — PROTIME-INR
INR: 2.2 — ABNORMAL HIGH (ref 0.8–1.2)
Prothrombin Time: 24.5 seconds — ABNORMAL HIGH (ref 11.4–15.2)

## 2021-06-11 LAB — URINALYSIS, COMPLETE (UACMP) WITH MICROSCOPIC
Bilirubin Urine: NEGATIVE
Glucose, UA: NEGATIVE mg/dL
Ketones, ur: NEGATIVE mg/dL
Nitrite: POSITIVE — AB
Protein, ur: NEGATIVE mg/dL
RBC / HPF: >50 RBC/hpf (ref 0–5)
Specific Gravity, Urine: 1.02 (ref 1.005–1.030)
pH: 5.5 (ref 5.0–8.0)

## 2021-06-11 LAB — LACTATE DEHYDROGENASE: LDH: 197 U/L — ABNORMAL HIGH (ref 98–192)

## 2021-06-11 LAB — MAGNESIUM: Magnesium: 2.2 mg/dL (ref 1.7–2.4)

## 2021-06-11 LAB — COOXEMETRY PANEL
Carboxyhemoglobin: 1.1 % (ref 0.5–1.5)
Methemoglobin: 2.7 % — ABNORMAL HIGH (ref 0.0–1.5)
O2 Saturation: 80.7 %
Total hemoglobin: 10.6 g/dL — ABNORMAL LOW (ref 12.0–16.0)

## 2021-06-11 LAB — BASIC METABOLIC PANEL
Anion gap: 7 (ref 5–15)
Anion gap: 9 (ref 5–15)
BUN: 48 mg/dL — ABNORMAL HIGH (ref 8–23)
BUN: 48 mg/dL — ABNORMAL HIGH (ref 8–23)
CO2: 36 mmol/L — ABNORMAL HIGH (ref 22–32)
CO2: 36 mmol/L — ABNORMAL HIGH (ref 22–32)
Calcium: 8.3 mg/dL — ABNORMAL LOW (ref 8.9–10.3)
Calcium: 8.5 mg/dL — ABNORMAL LOW (ref 8.9–10.3)
Chloride: 103 mmol/L (ref 98–111)
Chloride: 103 mmol/L (ref 98–111)
Creatinine, Ser: 0.96 mg/dL (ref 0.61–1.24)
Creatinine, Ser: 0.99 mg/dL (ref 0.61–1.24)
GFR, Estimated: >60 mL/min (ref >60)
GFR, Estimated: >60 mL/min (ref >60)
Glucose, Bld: 145 mg/dL — ABNORMAL HIGH (ref 70–99)
Glucose, Bld: 182 mg/dL — ABNORMAL HIGH (ref 70–99)
Potassium: 4.3 mmol/L (ref 3.5–5.1)
Potassium: 4.2 mmol/L (ref 3.5–5.1)
Sodium: 146 mmol/L — ABNORMAL HIGH (ref 135–145)
Sodium: 148 mmol/L — ABNORMAL HIGH (ref 135–145)

## 2021-06-11 LAB — GLUCOSE, CAPILLARY
Glucose-Capillary: 163 mg/dL — ABNORMAL HIGH (ref 70–99)
Glucose-Capillary: 141 mg/dL — ABNORMAL HIGH (ref 70–99)
Glucose-Capillary: 141 mg/dL — ABNORMAL HIGH (ref 70–99)
Glucose-Capillary: 134 mg/dL — ABNORMAL HIGH (ref 70–99)
Glucose-Capillary: 173 mg/dL — ABNORMAL HIGH (ref 70–99)
Glucose-Capillary: 177 mg/dL — ABNORMAL HIGH (ref 70–99)
Glucose-Capillary: 132 mg/dL — ABNORMAL HIGH (ref 70–99)

## 2021-06-11 LAB — GLOBAL TEG PANEL
CFF Max Amplitude: 20.3 mm (ref 15–32)
CK with Heparinase (R): 14.7 min — ABNORMAL HIGH (ref 4.3–8.3)
Citrated Functional Fibrinogen: 370.4 mg/dL (ref 278–581)
Citrated Kaolin (K): 1.7 min (ref 0.8–2.1)
Citrated Kaolin (MA): 61.7 mm (ref 52–69)
Citrated Kaolin (R): 13.1 min — ABNORMAL HIGH (ref 4.6–9.1)
Citrated Kaolin Angle: 69.8 deg (ref 63–78)
Citrated Rapid TEG (MA): 63 mm (ref 52–70)

## 2021-06-11 LAB — CBC
HCT: 28.2 % — ABNORMAL LOW (ref 39.0–52.0)
HCT: 33.9 % — ABNORMAL LOW (ref 39.0–52.0)
Hemoglobin: 8.4 g/dL — ABNORMAL LOW (ref 13.0–17.0)
Hemoglobin: 10.4 g/dL — ABNORMAL LOW (ref 13.0–17.0)
MCH: 29.5 pg (ref 26.0–34.0)
MCH: 29.6 pg (ref 26.0–34.0)
MCHC: 29.8 g/dL — ABNORMAL LOW (ref 30.0–36.0)
MCHC: 30.7 g/dL (ref 30.0–36.0)
MCV: 98.9 fL (ref 80.0–100.0)
MCV: 96.6 fL (ref 80.0–100.0)
Platelets: 90 10*3/uL — ABNORMAL LOW (ref 150–400)
Platelets: 95 10*3/uL — ABNORMAL LOW (ref 150–400)
RBC: 2.85 MIL/uL — ABNORMAL LOW (ref 4.22–5.81)
RBC: 3.51 MIL/uL — ABNORMAL LOW (ref 4.22–5.81)
RDW: 17.9 % — ABNORMAL HIGH (ref 11.5–15.5)
RDW: 17.9 % — ABNORMAL HIGH (ref 11.5–15.5)
WBC: 11.3 10*3/uL — ABNORMAL HIGH (ref 4.0–10.5)
WBC: 14.6 10*3/uL — ABNORMAL HIGH (ref 4.0–10.5)
nRBC: 0 % (ref 0.0–0.2)
nRBC: 0.1 % (ref 0.0–0.2)

## 2021-06-11 LAB — APTT
aPTT: 60 seconds — ABNORMAL HIGH (ref 24–36)
aPTT: 60 seconds — ABNORMAL HIGH (ref 24–36)
aPTT: 65 seconds — ABNORMAL HIGH (ref 24–36)

## 2021-06-11 LAB — HEPATIC FUNCTION PANEL
ALT: 60 U/L — ABNORMAL HIGH (ref 0–44)
AST: 26 U/L (ref 15–41)
Albumin: 3 g/dL — ABNORMAL LOW (ref 3.5–5.0)
Alkaline Phosphatase: 77 U/L (ref 38–126)
Bilirubin, Direct: 0.2 mg/dL (ref 0.0–0.2)
Indirect Bilirubin: 0.4 mg/dL (ref 0.3–0.9)
Total Bilirubin: 0.6 mg/dL (ref 0.3–1.2)
Total Protein: 4.8 g/dL — ABNORMAL LOW (ref 6.5–8.1)

## 2021-06-11 LAB — LACTIC ACID, PLASMA: Lactic Acid, Venous: 1.1 mmol/L (ref 0.5–1.9)

## 2021-06-11 LAB — PREPARE RBC (CROSSMATCH)

## 2021-06-11 LAB — FIBRINOGEN: Fibrinogen: 442 mg/dL (ref 210–475)

## 2021-06-11 MED ORDER — HALOPERIDOL LACTATE 5 MG/ML IJ SOLN
5.0000 mg | Freq: Once | INTRAMUSCULAR | Status: DC
  Filled 2021-06-11: qty 1

## 2021-06-11 MED ORDER — ACETAZOLAMIDE 250 MG PO TABS
250.0000 mg | ORAL_TABLET | Freq: Once | ORAL | Status: AC
  Administered 2021-06-11: 250 mg
  Filled 2021-06-11: qty 1

## 2021-06-11 MED ORDER — PANTOPRAZOLE SODIUM 40 MG IV SOLR
40.0000 mg | Freq: Two times a day (BID) | INTRAVENOUS | Status: DC
  Administered 2021-06-11 – 2021-06-16 (×11): 40 mg via INTRAVENOUS
  Filled 2021-06-11 (×12): qty 10

## 2021-06-11 MED ORDER — NUTRISOURCE FIBER PO PACK
1.0000 | PACK | Freq: Three times a day (TID) | ORAL | Status: DC
  Administered 2021-06-11 – 2021-06-16 (×18): 1
  Filled 2021-06-11 (×19): qty 1

## 2021-06-11 NOTE — Progress Notes (Signed)
OT Cancellation Note  Patient Details Name: Randall Hodges MRN: [id number] DOB: 12-19-1950   Cancelled Treatment:    Reason Eval/Treat Not Completed: Medical issues which prohibited therapy (Possible reaction to blood transfusion. Pt with facial edema. Will hold and return as schedule allows. Thank you.)  [place], OTR/L Acute Rehab Office: [phone] 06/11/2021, 1:20 PM

## 2021-06-11 NOTE — Progress Notes (Signed)
Patient ID: Randall Hodges, male   DOB: 11/12/1950, 71 y.o.   MRN: [id number] ° ° ° ° °Progress Note from the Palliative Medicine Team at [facility] ° ° °Patient Name: Randall Hodges        °Date: 06/11/2021 °DOB: 05/28/1950  Age: 71 y.o. MRN#: [id number] °Attending Physician: Vantrigt, Peter, MD °Primary Care Physician: Aronson, Richard, MD °Admit Date: 04/19/2021 ° ° °Medical records reviewed; labs, providers  notes ° °71-year-old male with coronary artery disease who initially presented with chest pain, noted to have multivessel coronary artery disease, he underwent CABG x3 on 1/18/ 2023, course was complicated with atrial fibrillation, frequent hiccups and aspiration pneumonia leading to hypoxia and increasing oxygen requirement. ° °Significant Hospital events: ° °1/18 CABG °1/30 PCCM consult, Worsening O2 needs, likely aspiration, new pressor need °2/2 intubation, placed on VV ECMO °2/5 issues with recirc, bronch neg, improved with supine positioning °2/7 percutaneous tracheostomy °2/10 diuresis and weaning sedation. CT chest shows diffuse ground-glass opacification.  °2/11 weaning sedation °2/13 increasing air hunger as oxygenator efficiency decreasing.  °2/14 oxygenator changed.  Still having air hunger but able to follow commands and participate with therapy.  °2/15 considerable distress with double stacking.  °2/17 atrial flutter with RVR, amiodarone restarted °Pneumonedaustuma  ° ° °This NP rounded on  patient / wife at the bedside along with her sister,  as a follow up for palliative medicine needs and emotional support.   ° °I met with patient's wife and son for initial goals of care meeting on 05/26/2021. ° °Patient remains critically ill on ECMO. plan at this point is to continue aggressive support until 272 and get a repeat CT that day.  Education offered on if  no improvement or there are further fibrotic changes it will be necessary to consider setting boundaries around continued ECMO support secondary  to futility.  Ongoing education regarding the significance of heart and lung disease and the optimal function of the human body as a whole. ° °Created space and opportunity for wife/Pamela Hiltz to explore thoughts and feelings regarding her husband's current medical situation.  Today wife verbalizes understanding and somewhat acceptance of the likely poor outcome.   However she remains prayer full and hopeful for improvement ° °Both of her children live out of town, considering the medical situation she has contacted her children and hopefully they will be returning to [place]. ° °Education and assistance in how to secure adult access to her husband's MyChart account ° °Emotional support offered ° °PMT will continue to support holistically  ° °Education offered on the importance of continued conversation with her  family and their  medical providers regarding overall plan of care and treatment options,  ensuring decisions are within the context of the patients values and GOCs. ° °Questions and concerns addressed   Discussed with bedside RN  ° ° °  NP  °Palliative Medicine Team Team Phone # [phone] °Pager [phone] °  °

## 2021-06-11 NOTE — Progress Notes (Addendum)
OT Cancellation Note  Patient Details Name: Randall Hodges MRN: [id number] DOB: 04-16-1951   Cancelled Treatment:    Reason Eval/Treat Not Completed: Medical issues which prohibited therapy (MD state hold therapy today as facial swelling is from sub Q air. Will return as schedule allows.)  [place], OTR/L Acute Rehab Pager: [phone] Office: [phone] 06/11/2021, 3:25 PM

## 2021-06-11 NOTE — Progress Notes (Signed)
NAME:  Randall Hodges, MRN:  [id number], DOB:  09/29/1950, LOS: 87 ADMISSION DATE:  04/28/2021, CONSULTATION DATE: 04/20/2021 REFERRING MD:  Dahlia Byes, MD, CHIEF COMPLAINT: Increasing shortness of breath  History of Present Illness:  71 year old male with coronary artery disease who initially presented with chest pain, noted to have multivessel coronary artery disease, he underwent CABG x3 on 04/1818 23, course was complicated with atrial fibrillation, frequent hiccups and aspiration pneumonia leading to hypoxia and increasing oxygen requirement. PCCM was consulted for evaluation and help with management  Patient stated hiccups are better but continued complain of shortness of breath, cough unable to bring up phlegm.  Pertinent  Medical History  CAD Allergic rhinitis Prostate cancer GERD Pulmonary fibrosis   Significant Hospital Events: Including procedures, antibiotic start and stop dates in addition to other pertinent events   1/18 CABG 1/30 PCCM consult, Worsening O2 needs, likely aspiration, new pressor need 2/2 intubation, placed on VV ECMO 2/5 issues with recirc, bronch neg, improved with supine positioning 2/7 percutaneous tracheostomy 2/10 diuresis and weaning sedation. CT chest shows diffuse ground-glass opacification.  2/11 weaning sedation 2/13 increasing air hunger as oxygenator efficiency decreasing.  2/14 oxygenator changed.  Still having air hunger but able to follow commands and participate with therapy.  2/15 considerable distress with double stacking. Switched to true rest settings - with improvement in distress.  2/16 bronch, NGT placed to LIWS, con't post-pyloric TF 2/17 DCCV overnight for Afib with RVR. Stood at bedside with PT.  Interim History / Subjective:  Remains on 4.5 sweep. Some oozing from ECMO cannula. Now weaned off fentanyl.  Objective   Blood pressure 111/67, pulse 80, temperature 98.2 F (36.8 C), temperature source Core, resp. rate 12,  height 5\' 10"  (1.778 m), weight 82.4 kg, SpO2 100 %. CVP:  [2 mmHg-11 mmHg] 8 mmHg  Vent Mode: PCV FiO2 (%):  [30 %] 30 % Set Rate:  [10 bmp] 10 bmp PEEP:  [18 cmH20] 18 cmH20 Plateau Pressure:  [23 cmH20-33 cmH20] 28 cmH20   Intake/Output Summary (Last 24 hours) at 06/11/2021 0900 Last data filed at 06/11/2021 0800 Gross per 24 hour  Intake 2629.93 ml  Output 4860 ml  Net -2230.07 ml    Filed Weights   06/09/21 0500 06/10/21 0500 06/11/21 0500  Weight: 88.9 kg 87.1 kg 82.4 kg   Examination: More comfortable breathing pattern this AM Lungs with poor air movement, tiggering vent Ext warm, no ectopy on monitor at present ECMO circuit with minimal clot, good color change Moves all 4 ext to command 06/10/21: [place] R chest small free flow effusion mostly in dependent area  ABG okay Sodium stable Bicarb slightly better with diamox Cr ok CBG ok H/H okay Plts okay CXR stable ARDS pattern with improved aeration on increased PEEP  Plat 28, PEEP 18 Currently on 10/18 PC  Ancillary tests personally reviewed:     Assessment & Plan:   Acute hypoxic and hypercapnic respiratory failure with hypoxia ARDS Aspiration pneumonitis- recurrent, now with NGT to suction, on reglan, getting postpyloric feeds Hiccups, recurrent aspiration episodes.  ILD, mild- question of chronic aspiration syndrome Agitation, ICU delirium, and air hunger- improved today CAD with UA s/p CABG x 3, (LIMA-LAD, SVG-OM, SVG-left PDA) Afib with RVR, DCCV, NSVT, ectopy- on amio gtt Hypertension Possible GPC bacteremia vs contaminant- staph epi in 2/4 bottles- s/p 7 days vanc - Lung protective TV - Sweep wean to pH > 7.35 and/or WOB - NG to LIS, cortrak for PP TF, reglan -  VV ECMO and diuresis management per CHF team, diamox and reduce lasix gtt today -con't baclofen, morphine, seroquel, klonipin, melatonin and gabapentin; fent drip weaned off today - colchicine per TCTS - bowel regimen as ordered, rectal  management system in place, stool bulkener to be ordered by PharmD - Continue PT/OT efforts - PPI BID - Finished trial of steroids, did not seem to have effect - Aspirin, statin - Holding coreg due to bradycardia - Continue IV amiodarone for frequent ectopy and questionable GI absorption - Hydralazine PRN; can add scheduled if needed  Continue current aggressive support until 2/27 and get a repeat CT that day.  If no improvement in GGO or there are fibrotic changes, we will need to consider setting a deadline for continued ECMO support due to futility.  Discussed with CHF/TCTS teams and family.  Best Practice (right click and "Reselect all SmartList Selections" daily)   Diet/type: TF DVT prophylaxis: Bival CBG: SSI GI prophylaxis: PPI Lines: L [place] TLC, RIJ  Foley:  Yes Code Status:  full code Last date of multidisciplinary goals of care discussion [2/16]    Patient critically ill due to resp failure, delirium Interventions to address this today sedation and vent titration Risk of deterioration without these interventions is high  I personally spent 34 minutes providing critical care not including any separately billable procedures  Erskine Emery MD [facility]  Prefer epic messenger for cross cover needs If after hours, please call E-link

## 2021-06-11 NOTE — Progress Notes (Signed)
ANTICOAGULATION CONSULT NOTE  Pharmacy Consult for Bivalirudin Indication:  ECMO  Allergies  Allergen Reactions   Amoxicillin-Pot Clavulanate     Per pt report on 08/19/20, makes his urine dark colored   Atorvastatin Other (See Comments)     ( pt states causing runny nose, headaches, issue w/ urination)   Plant Derived Enzymes     Other reaction(s): Unknown   Trichophyton Other (See Comments)    Patient Measurements: Height: 5\' 10"  (177.8 cm) Weight: 82.4 kg (181 lb 10.5 oz) IBW/kg (Calculated) : 73  Vital Signs: Temp: 97.8 F (36.6 C) (02/23 1225) Temp Source: Axillary (02/23 1225) BP: 153/65 (02/23 1550) Pulse Rate: 80 (02/23 1700)  Labs: Recent Labs    06/09/21 0407 06/09/21 0410 06/10/21 0233 06/10/21 0455 06/10/21 1643 06/10/21 1651 06/11/21 0013 06/11/21 0056 06/11/21 0402 06/11/21 0406 06/11/21 1335 06/11/21 1644 06/11/21 1646  HGB 8.9*   < > 9.3*   < > 9.7*   < >  --    < > 8.4*   < > 10.9* 10.4* 10.9*  HCT 28.6*   < > 29.5*   < > 30.9*   < >  --    < > 28.2*   < > 32.0* 33.9* 32.0*  PLT 104*   < > 102*  --  103*  --   --   --  90*  --   --  95*  --   APTT 54*   < > 43*   < > 61*  --  60*  --  65*  --   --  60*  --   LABPROT 24.8*  --  23.8*  --   --   --   --   --  24.5*  --   --   --   --   INR 2.2*  --  2.1*  --   --   --   --   --  2.2*  --   --   --   --   CREATININE 1.10   < > 1.01  --  1.00  --   --   --  0.96  --   --  0.99  --    < > = values in this interval not displayed.     Estimated Creatinine Clearance: 70.7 mL/min (by C-G formula based on SCr of 0.99 mg/dL).  Assessment: 71 year old male with coronary artery disease who initially presented with chest pain, noted to have multivessel coronary artery disease. He underwent CABG x3 on 05/02/2021; course was complicated with atrial fibrillation, frequent hiccups and aspiration pneumonia leading to hypoxia and increasing oxygen requirement. Patient now requiring VV ECMO. Pharmacy consulted for  bivalirudin IV.  aPTT therapeutic at 60 sec. Bleeding from NG tube stable.  Goal of Therapy:  aPTT 50-80 seconds Monitor platelets by anticoagulation protocol: Yes   Plan:  Continue bivalirudin at 0.075 mg/kg/hr Monitor q12h aptt and CBC Monitor closely for s/sx bleeding/thrombus  Ommie Degeorge D. Mina Marble, PharmD, BCPS, [place] 06/11/2021, 6:07 PM

## 2021-06-11 NOTE — Progress Notes (Signed)
Patient ID: Randall Hodges, male   DOB: 05-31-1950, 71 y.o.   MRN: [id number]    Progress Note  Patient Name: Randall Hodges Date of Encounter: 06/11/2021  [facility] Cardiologist: [name], MD   Subjective   2/2: VV ECMO initiation, Crescent catheter right IJ 2/7: Tracheostomy 2/13 CT C/A/P: Diffuse ground-glass opacities consistent with atelectasis and edema presumably related to ECMO physiology and respiratory failure.  Underlying moderate interstitial lung disease mildly progressed from prior CT. 2/15: Oxygenator changed, trach changed.  2/16: Transient asystole with cough and adjustment of tracheostomy overnight.  2/17: Atrial flutter with RVR, required emergent DCCV and amiodarone restarted  CXR with severe bilateral diffuse disease, starting to improve.   Off antibiotics.  Suspect aspiration again with TFs in trach on 2/16, NGT placed to suction and also has post-pyloric tube for feeds.   Vent FiO2 0.3. CVP 9-10 this morning, on Lasix gtt at 5 with excellent diuresis (net negative 2.1 L, weight down).  Creatinine and BUN stable.  No chugging.   NSR this morning on amiodarone gtt.   Patient has been working with PT.   Hgb lower at 8.4.   ECMO: Speed 3300 rpm Flow 4.6 L/min pVen -82 DeltaP 24 Sweep 4 PTT 65 (goal 50-80) LDH 197 ABG 7.38/64/101/97% Lactate 1.1  Cardiac Studies: Echo (limited, 1/30): Echo reviewed, there is clear respirophasic variation of the interventricular septum and marked respirophasic variation of E inflow velocity on doppler evaluation of the mitral valve.  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  LV EF 45-50%.   RHC Procedural Findings (on norepinephrine 6): Hemodynamics (mmHg) RA mean 12 RV 37/12 PA 38/16, mean 27 PCWP mean 11 LV 108/12 AO  96/55 PAPI 1.8 Oxygen saturations: PA 54% AO 94% Cardiac Output (Fick) 5.59  Cardiac Index (Fick) 2.81  PVR 2.8 WU Simultaneous RV/LV tracings were obtained.  Difficult to interpret due to atrial fibrillation.  There was some suggestion of discordance (ventricular interdependence) but not clear.  Inpatient Medications    Scheduled Meds:  acetaZOLAMIDE  250 mg Per Tube Once   aspirin  81 mg Per Tube Daily   baclofen  5 mg Per Tube TID   chlorhexidine gluconate (MEDLINE KIT)  15 mL Mouth Rinse BID   Chlorhexidine Gluconate Cloth  6 each Topical Daily   clonazePAM  1 mg Per Tube BID   colchicine  0.6 mg Per Tube Daily   feeding supplement (PROSource TF)  90 mL Per Tube BID   fiber  1 packet Per Tube TID   free water  200 mL Per Tube Q4H   gabapentin  300 mg Per Tube QHS   Gerhardt's butt cream   Topical TID   insulin aspart  0-15 Units Subcutaneous Q4H   mouth rinse  15 mL Mouth Rinse 10 times per day   melatonin  5 mg Per Tube QHS   methylPREDNISolone (SOLU-MEDROL) injection  20 mg Intravenous Q24H   metoCLOPramide (REGLAN) injection  5 mg Intravenous Q8H   morphine  30 mg Per Tube Q6H   pantoprazole sodium  40 mg Per Tube BID   potassium chloride  40 mEq Per Tube BID   QUEtiapine  25 mg Per Tube QHS   rosuvastatin  10 mg Per Tube Daily   sodium chloride flush  10-40 mL Intracatheter Q12H   sodium chloride flush  10-40  mL Intracatheter Q12H   sodium chloride flush  3 mL Intravenous Q12H   Continuous Infusions:  sodium chloride Stopped (06/01/21 1121)   sodium chloride Stopped (06/10/21 1705)   sodium chloride 10 mL/hr at 06/10/21 1705   albumin human 12.5 g (06/10/21 2101)   albumin human 12.5 g (06/08/21 1820)   amiodarone 30 mg/hr (06/11/21 0700)   bivalirudin (ANGIOMAX) infusion 0.5 mg/mL (Non-ACS indications) 0.0748 mg/kg/hr (06/11/21 0700)   dexmedetomidine (PRECEDEX) IV infusion Stopped (06/05/21 0655)   feeding supplement (VITAL 1.5 CAL) 1,000 mL (06/09/21 1615)    fentaNYL infusion INTRAVENOUS Stopped (06/11/21 0548)   furosemide (LASIX) 200 mg in dextrose 5% 100 mL (21m/mL) infusion 5 mg/hr (06/11/21 0700)   PRN Meds: Place/Maintain arterial line **AND** sodium chloride, sodium chloride, sodium chloride, acetaminophen (TYLENOL) oral liquid 160 mg/5 mL, albumin human, albumin human, diphenhydrAMINE, docusate, fentaNYL, fentaNYL (SUBLIMAZE) injection, fentaNYL (SUBLIMAZE) injection, guaiFENesin, hydrALAZINE, levalbuterol, lip balm, ondansetron (ZOFRAN) IV, polyvinyl alcohol, sennosides, sodium chloride flush, white petrolatum   Vital Signs    Vitals:   06/11/21 0615 06/11/21 0630 06/11/21 0645 06/11/21 0700  BP:   (!) 97/58 111/67  Pulse: 86 83 81 80  Resp: '12 18 11 12  ' Temp:      TempSrc:      SpO2: 100% 100% 100% 100%  Weight:      Height:        Intake/Output Summary (Last 24 hours) at 06/11/2021 0747 Last data filed at 06/11/2021 0700 Gross per 24 hour  Intake 2800.49 ml  Output 4910 ml  Net -2109.51 ml   Last 3 Weights 06/11/2021 06/10/2021 06/09/2021  Weight (lbs) 181 lb 10.5 oz 192 lb 0.3 oz 195 lb 15.8 oz  Weight (kg) 82.4 kg 87.1 kg 88.9 kg      Telemetry    NSR, personally reviewed.   Physical Exam   General: NAD Neck: RIJ Crescent cannula, no thyromegaly or thyroid nodule.  Lungs: Clear to auscultation bilaterally with normal respiratory effort. CV: Nondisplaced PMI.  Heart regular S1/S2, no S3/S4, no murmur.  1+ edema to knees.  Abdomen: Soft, nontender, no hepatosplenomegaly, no distention.  Skin: Intact without lesions or rashes.  Neurologic: Follows commands Extremities: No clubbing or cyanosis.  HEENT: NGT and Cortrak.    Labs    High Sensitivity Troponin:   Recent Labs  Lab 06/05/21 2208 06/06/21 0037 06/06/21 0532  TROPONINIHS 211* 226* 196*      Chemistry Recent Labs  Lab 06/08/21 0308602/20/23 0804 06/09/21 0407 06/09/21 0410 06/10/21 0233 06/10/21 0455 06/10/21 1643 06/10/21 1651  06/11/21 0056 06/11/21 0402 06/11/21 0406  NA  --    < > 146*   < > 148*   < > 148*   < > 146* 146* 148*  K  --    < > 4.1   < > 4.1   < > 3.7   < > 4.6 4.3 4.3  CL  --    < > 104   < > 103  --  103  --   --  103  --   CO2  --    < > 33*   < > 36*  --  37*  --   --  36*  --   GLUCOSE  --    < > 138*   < > 135*  --  158*  --   --  145*  --   BUN  --    < > 52*   < >  50*  --  48*  --   --  48*  --   CREATININE  --    < > 1.10   < > 1.01  --  1.00  --   --  0.96  --   CALCIUM  --    < > 8.3*   < > 8.1*  --  8.3*  --   --  8.3*  --   MG 2.1  --   --   --  2.0  --   --   --   --  2.2  --   PROT  --   --  4.8*  --  4.8*  --   --   --   --  4.8*  --   ALBUMIN  --   --  3.1*  --  2.9*  --   --   --   --  3.0*  --   AST  --   --  39  --  31  --   --   --   --  26  --   ALT  --   --  77*  --  70*  --   --   --   --  60*  --   ALKPHOS  --   --  82  --  78  --   --   --   --  77  --   BILITOT  --   --  0.5  --  0.5  --   --   --   --  0.6  --   GFRNONAA  --    < > >60   < > >60  --  >60  --   --  >60  --   ANIONGAP  --    < > 9   < > 9  --  8  --   --  7  --    < > = values in this interval not displayed.    Lipids  No results for input(s): CHOL, TRIG, HDL, LABVLDL, LDLCALC, CHOLHDL in the last 168 hours.   Hematology Recent Labs  Lab 06/10/21 0233 06/10/21 0455 06/10/21 1643 06/10/21 1651 06/11/21 0056 06/11/21 0402 06/11/21 0406  WBC 11.8*  --  12.0*  --   --  11.3*  --   RBC 3.06*  --  3.16*  --   --  2.85*  --   HGB 9.3*   < > 9.7*   < > 8.5* 8.4* 8.8*  HCT 29.5*   < > 30.9*   < > 25.0* 28.2* 26.0*  MCV 96.4  --  97.8  --   --  98.9  --   MCH 30.4  --  30.7  --   --  29.5  --   MCHC 31.5  --  31.4  --   --  29.8*  --   RDW 18.0*  --  18.1*  --   --  17.9*  --   PLT 102*  --  103*  --   --  90*  --    < > = values in this interval not displayed.   Thyroid No results for input(s): TSH, FREET4 in the last 168 hours.  BNPNo results for input(s): BNP, PROBNP in the last 168 hours.   DDimer No results for input(s): DDIMER in the last 168 hours.   Radiology    DG Abd 1 View  Result Date: 06/10/2021 CLINICAL  DATA:  Ileus. EXAM: ABDOMEN - 1 VIEW COMPARISON:  Chest radiograph 06/10/2021 FINDINGS: Redemonstrated opacities within the lung bases bilaterally. Incompletely visualized ECMO cannula. Enteric tube projects over the proximal small bowel. Gas is demonstrated within nondilated loops of large and small bowel without obstructive pattern. Lumbar spine degenerative changes. IMPRESSION: Enteric tube projects over the left upper quadrant, likely in the proximal small bowel. No dilated loops of bowel identified on current exam. Electronically Signed   By: Lovey Newcomer M.D.   On: 06/10/2021 11:09   DG CHEST PORT 1 VIEW  Result Date: 06/10/2021 CLINICAL DATA:  ECMO EXAM: PORTABLE CHEST 1 VIEW COMPARISON:  06/09/2021 FINDINGS: Tracheostomy tube in stable positioning. Stable cardiomegaly with post CABG changes. ECMO cannula unchanged. NG tube terminates within the stomach. Additional enteric tube extends beyond the inferior margin of the field of view. Stable right-sided PICC line. Widespread bilateral airspace opacities with slightly improving aeration of the upper lung fields. Persistent bilateral pleural effusions, similar. No appreciable pneumothorax. IMPRESSION: 1. Stable lines and tubes. 2. Widespread bilateral airspace opacities with slightly improving aeration of the upper lung fields. 3. Persistent bilateral pleural effusions. Electronically Signed   By: Davina Poke D.O.   On: 06/10/2021 08:33    Cardiac Studies   Cath 04/19/2021 Distal left main [place] 111 bifurcation stenosis with 75% left main, 90% ostial to proximal LAD, and 80-90% ostial circumflex (difficult to assess due to heavy calcification). Severe mid circumflex disease with 70% eccentric mid stenosis and second obtuse marginal containing ostial to proximal greater than 80% stenosis.  (Bifurcation [place]  111 Severe calcification in left main and LAD in particular with diffuse 50% narrowing from proximal to mid vessel and tandem 70% stenoses in the mid LAD. Nondominant right coronary Normal LV function.  EF 55%.  LVEDP normal.    Patient Profile     71 y.o. male with PMH of PVCs presented with chest pain. Cardiac cath by Dr. Tamala Julian on 04/22/2021 showed 75% left main, 90% ost to prox LAD, 80-90% ost LCx, 70% mid LCx, 80% OM2, 50% prox to mid LAD, 70% mid LAD, EF 55%. Patient underwent CABG x 3 on 04/24/2021. Post op course complicated afib, treated with amio. CXR showed opacity in bilateral lung, started abx on 1/25 and diuretic. Started on Eliquis due to recurrence of afib.    Assessment & Plan    1. CAD: Admitted with unstable angina, cath with severe left main and proximal LAD/LCx disease (nondominant RCA).  CABG x 3 on 1/18 with LIMA-LAD, SVG-OM, SVG-left PDA.   - Continue ASA 81, statin.  2. 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 RHC 1/30 with equalization of diastolic pressures.  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  Repeated echo 2/2 still showed respirophasic septal variation but not as impressive. CVP 9-10 today.  Lungs still with diffuse bilateral infiltrates on CXR but improving.  Diuresed well with IV Lasix gtt yesterday, BUN/creatinine stable.  - Can decrease Lasix to 4 mg/hr and add acetazolamide 250 mg po x 1 today.  - With concern for development of post-surgical effusive/constrictive pericarditis, he is on colchicine.  - ?need for surgical intervention on  effusive/constrictive pericarditis. Will need improvement  of lung disease first then reassess, repeat echo not as impressive.   3. 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  Suspect primarily septic/vasodilatory shock. He is now off pressors.  4. Hiccups: Intractable initially, now improved.  - On baclofen and gabapentin.  5. PNA with acute hypoxic respiratory failure: Of note, he does appear to have had some pre-existing ILD from 2019 CT chest (?sarcoidosis). CXR with persistent bilateral infiltrates, possible mild improvement compared to yesterday.  Have thought most likely aspiration PNA/pneumonitis in setting of intractable hiccups. He has developed ARDS. COVID was negative.  FiO2 0.5 on vent.  CXR with bilateral diffuse airspace disease, unchanged. Has tracheostomy. CT chest 2/9 with persistent lung infiltrates.  Completed vancomycin and meropenem => MRSE 1/2 blood and trach aspirate, now back on vancomycin.  Suspect ongoing aspiration with TFs noted in trach on 2/16, now with NGT to suction and post-pyloric feeding tube.  LDH stable today. Stable ECMO circuit.  FiO2 0.3 with higher tidal volumes.  Stable ABG. Sweep down to 4.  - Vent and sedation per CCM.  - Solumedrol 20 mg IV daily with gradual taper, ESR now normal.  - Back on amiodarone but suspect not amiodarone lung toxicity.   - Now off antibiotics.  - Repeat CT chest next Monday to look for any improvement.  At that point, will need discussions about future plan.  6. Atypical atrial flutter/PVCs:  DCCV 2/17.  NSR this morning.   - Continue IV amiodarone.    - Bivalirudin gtt, goal PTT 50-80.  7. AKI: Creatinine stable 1.01. Follow closely.  8. Elevated LFTs: Suspect shock liver, follow CMET.  LFTs have trended down.  9. Anemia: Will give 1 unit PRBCs today.    10. Hypernatremia: Na 146 today.  - Continue free water 200 q4.  11. HTN: BP rises with sedation wean.  - prn hydralazine for now.  12.  Bradycardia/asystole: Vagally mediated with cough.  Resolves rapidly with resolution of cough.  - Atropine at bedside.  13. FEN: TFs restarted with post-pyloric tube and also NGT to suction to prevent aspiration.   - Continue Reglan.   14. Mobilize as much as possible with PT.  Limit sedation.   CRITICAL CARE Performed by: Loralie Champagne  Total critical care time: 45 minutes  Critical care time was exclusive of separately billable procedures and treating other patients.  Critical care was necessary to treat or prevent imminent or life-threatening deterioration.  Critical care was time spent personally by me on the following activities: development of treatment plan with patient and/or surrogate as well as nursing, discussions with consultants, evaluation of patient's response to treatment, examination of patient, obtaining history from patient or surrogate, ordering and performing treatments and interventions, ordering and review of laboratory studies, ordering and review of radiographic studies, pulse oximetry and re-evaluation of patient's condition.  Loralie Champagne MD 06/11/2021 7:47 AM

## 2021-06-11 NOTE — Progress Notes (Signed)
ANTICOAGULATION CONSULT NOTE - Follow Up Consult  Pharmacy Consult for bivalirudin Indication:  ECMO  Labs: Recent Labs    06/09/21 0407 06/09/21 0410 06/09/21 1617 06/09/21 1804 06/10/21 0233 06/10/21 0455 06/10/21 1643 06/10/21 1651 06/11/21 0013 06/11/21 0056 06/11/21 0402 06/11/21 0406  HGB 8.9*   < > 9.5*   < > 9.3*   < > 9.7*   < >  --  8.5* 8.4* 8.8*  HCT 28.6*   < > 30.1*   < > 29.5*   < > 30.9*   < >  --  25.0* 28.2* 26.0*  PLT 104*  --  116*  --  102*  --  103*  --   --   --  90*  --   APTT 54*  --  58*  --  43*   < > 61*  --  60*  --  65*  --   LABPROT 24.8*  --   --   --  23.8*  --   --   --   --   --  24.5*  --   INR 2.2*  --   --   --  2.1*  --   --   --   --   --  2.2*  --   CREATININE 1.10  --  1.04  --  1.01  --  1.00  --   --   --   --   --    < > = values in this interval not displayed.     Assessment: 71yo male remains therapeutic on bivalirudin but PTT is slowly increasing (60>65 from MN to 4a) and RN reports an increase of bleeding from NG tube.  Goal of Therapy:  aPTT 50-80 seconds   Plan:  Will decrease bivalirudin infusion by 10% to 0.075 mg/kg/hr and check PTT with next scheduled labs.    Wynona Neat, PharmD, BCPS  06/11/2021,4:44 AM

## 2021-06-11 NOTE — Progress Notes (Signed)
1225- Patient continuing to receive PRBCs; when I entered patients room (had been 68min since I was in the room) I noticed his face was flushed and swollen; also noted small amount of blood tinged urine.  Notified Dr. Tamala Julian of possible transfusion reaction; transfusion reaction protocol started.  Transfusion stopped and line flushed with NS carrier, labs/urine sent to lab alone with remaining PRBC and tubing.  Patient hemodynamically stable at this time.   1315- Patient had coughing episode with increasing swelling from neck to orbital areas; upon assessment was able to palpate subcutaneous air from upper chest to neck.  PCXR ordered and Dr. Tamala Julian to bedside.  1330- able to palpate subcutaneous air bilaterally; Dr. Tamala Julian aware.  1400- Patients wife Pam at bedside; she was updated and all questions answered by Dr. Tamala Julian

## 2021-06-11 NOTE — Progress Notes (Signed)
Coughing fit followed by neck swelling. CXR confirms pneumomediastinum and subQ air. No clear PTX. HD stable. Nothing acute to do. Will lower PEEP. Try to keep coughing fits under better control.  Erskine Emery MD PCCM

## 2021-06-11 NOTE — Progress Notes (Signed)
Patient ID: Randall Hodges, male   DOB: 08-05-1950, 71 y.o.   MRN: [id number] Extracorporeal support note  ECLS support day: 20 Indication: ARDS  Configuration: VV  Drainage cannula: Crescent R IJ Return cannula: Crescent R IJ  Pump speed: 3300 rpm Pump flow: 4.6 L/min Pump used: Cardiohelp  Sweep gas: 4   Circuit check: Good color change. LDH stable 197.  Anticoagulant: Bivalirudin, PTT goal 50-80. PTT 65  Changes in support: No changes today.   Anticipated goals/duration of support: Wean to discontinuation  Loralie Champagne, MD  7:39 AM

## 2021-06-12 ENCOUNTER — Inpatient Hospital Stay (HOSPITAL_COMMUNITY): Payer: PPO

## 2021-06-12 DIAGNOSIS — J9602 Acute respiratory failure with hypercapnia: Secondary | ICD-10-CM | POA: Diagnosis not present

## 2021-06-12 DIAGNOSIS — J9601 Acute respiratory failure with hypoxia: Secondary | ICD-10-CM | POA: Diagnosis not present

## 2021-06-12 LAB — LACTIC ACID, PLASMA: Lactic Acid, Venous: 1 mmol/L (ref 0.5–1.9)

## 2021-06-12 LAB — BASIC METABOLIC PANEL
Anion gap: 7 (ref 5–15)
Anion gap: 8 (ref 5–15)
BUN: 47 mg/dL — ABNORMAL HIGH (ref 8–23)
BUN: 46 mg/dL — ABNORMAL HIGH (ref 8–23)
CO2: 35 mmol/L — ABNORMAL HIGH (ref 22–32)
CO2: 33 mmol/L — ABNORMAL HIGH (ref 22–32)
Calcium: 8.4 mg/dL — ABNORMAL LOW (ref 8.9–10.3)
Calcium: 8.5 mg/dL — ABNORMAL LOW (ref 8.9–10.3)
Chloride: 104 mmol/L (ref 98–111)
Chloride: 103 mmol/L (ref 98–111)
Creatinine, Ser: 0.93 mg/dL (ref 0.61–1.24)
Creatinine, Ser: 0.88 mg/dL (ref 0.61–1.24)
GFR, Estimated: >60 mL/min (ref >60)
GFR, Estimated: >60 mL/min (ref >60)
Glucose, Bld: 124 mg/dL — ABNORMAL HIGH (ref 70–99)
Glucose, Bld: 133 mg/dL — ABNORMAL HIGH (ref 70–99)
Potassium: 4.1 mmol/L (ref 3.5–5.1)
Potassium: 3.7 mmol/L (ref 3.5–5.1)
Sodium: 146 mmol/L — ABNORMAL HIGH (ref 135–145)
Sodium: 144 mmol/L (ref 135–145)

## 2021-06-12 LAB — POCT I-STAT EG7
Acid-Base Excess: 6 mmol/L — ABNORMAL HIGH (ref 0.0–2.0)
Bicarbonate: 32 mmol/L — ABNORMAL HIGH (ref 20.0–28.0)
Calcium, Ion: 1.16 mmol/L (ref 1.15–1.40)
HCT: 27 % — ABNORMAL LOW (ref 39.0–52.0)
Hemoglobin: 9.2 g/dL — ABNORMAL LOW (ref 13.0–17.0)
O2 Saturation: 77 %
Patient temperature: 36.7
Potassium: 4.3 mmol/L (ref 3.5–5.1)
Sodium: 147 mmol/L — ABNORMAL HIGH (ref 135–145)
TCO2: 34 mmol/L — ABNORMAL HIGH (ref 22–32)
pCO2, Ven: 51.6 mmHg (ref 44–60)
pH, Ven: 7.399 (ref 7.25–7.43)
pO2, Ven: 42 mmHg (ref 32–45)

## 2021-06-12 LAB — POCT I-STAT 7, (LYTES, BLD GAS, ICA,H+H)
Acid-Base Excess: 12 mmol/L — ABNORMAL HIGH (ref 0.0–2.0)
Acid-Base Excess: 9 mmol/L — ABNORMAL HIGH (ref 0.0–2.0)
Acid-Base Excess: 8 mmol/L — ABNORMAL HIGH (ref 0.0–2.0)
Acid-Base Excess: 8 mmol/L — ABNORMAL HIGH (ref 0.0–2.0)
Acid-Base Excess: 11 mmol/L — ABNORMAL HIGH (ref 0.0–2.0)
Acid-Base Excess: 9 mmol/L — ABNORMAL HIGH (ref 0.0–2.0)
Acid-Base Excess: 9 mmol/L — ABNORMAL HIGH (ref 0.0–2.0)
Bicarbonate: 37.7 mmol/L — ABNORMAL HIGH (ref 20.0–28.0)
Bicarbonate: 35.3 mmol/L — ABNORMAL HIGH (ref 20.0–28.0)
Bicarbonate: 36 mmol/L — ABNORMAL HIGH (ref 20.0–28.0)
Bicarbonate: 34.2 mmol/L — ABNORMAL HIGH (ref 20.0–28.0)
Bicarbonate: 34.6 mmol/L — ABNORMAL HIGH (ref 20.0–28.0)
Bicarbonate: 34.2 mmol/L — ABNORMAL HIGH (ref 20.0–28.0)
Bicarbonate: 33.8 mmol/L — ABNORMAL HIGH (ref 20.0–28.0)
Calcium, Ion: 1.23 mmol/L (ref 1.15–1.40)
Calcium, Ion: 1.22 mmol/L (ref 1.15–1.40)
Calcium, Ion: 1.24 mmol/L (ref 1.15–1.40)
Calcium, Ion: 1.21 mmol/L (ref 1.15–1.40)
Calcium, Ion: 1.11 mmol/L — ABNORMAL LOW (ref 1.15–1.40)
Calcium, Ion: 1.22 mmol/L (ref 1.15–1.40)
Calcium, Ion: 1.24 mmol/L (ref 1.15–1.40)
HCT: 28 % — ABNORMAL LOW (ref 39.0–52.0)
HCT: 33 % — ABNORMAL LOW (ref 39.0–52.0)
HCT: 35 % — ABNORMAL LOW (ref 39.0–52.0)
HCT: 30 % — ABNORMAL LOW (ref 39.0–52.0)
HCT: 27 % — ABNORMAL LOW (ref 39.0–52.0)
HCT: 33 % — ABNORMAL LOW (ref 39.0–52.0)
HCT: 33 % — ABNORMAL LOW (ref 39.0–52.0)
Hemoglobin: 9.5 g/dL — ABNORMAL LOW (ref 13.0–17.0)
Hemoglobin: 11.2 g/dL — ABNORMAL LOW (ref 13.0–17.0)
Hemoglobin: 11.9 g/dL — ABNORMAL LOW (ref 13.0–17.0)
Hemoglobin: 10.2 g/dL — ABNORMAL LOW (ref 13.0–17.0)
Hemoglobin: 9.2 g/dL — ABNORMAL LOW (ref 13.0–17.0)
Hemoglobin: 11.2 g/dL — ABNORMAL LOW (ref 13.0–17.0)
Hemoglobin: 11.2 g/dL — ABNORMAL LOW (ref 13.0–17.0)
O2 Saturation: 91 %
O2 Saturation: 90 %
O2 Saturation: 83 %
O2 Saturation: 86 %
O2 Saturation: 100 %
O2 Saturation: 93 %
O2 Saturation: 92 %
Patient temperature: 36.7
Patient temperature: 36.4
Patient temperature: 36.5
Patient temperature: 36.8
Patient temperature: 36.8
Patient temperature: 36.9
Patient temperature: 36.9
Potassium: 4.1 mmol/L (ref 3.5–5.1)
Potassium: 4 mmol/L (ref 3.5–5.1)
Potassium: 4.9 mmol/L (ref 3.5–5.1)
Potassium: 4.7 mmol/L (ref 3.5–5.1)
Potassium: 4.2 mmol/L (ref 3.5–5.1)
Potassium: 3.7 mmol/L (ref 3.5–5.1)
Potassium: 4.3 mmol/L (ref 3.5–5.1)
Sodium: 144 mmol/L (ref 135–145)
Sodium: 145 mmol/L (ref 135–145)
Sodium: 147 mmol/L — ABNORMAL HIGH (ref 135–145)
Sodium: 145 mmol/L (ref 135–145)
Sodium: 145 mmol/L (ref 135–145)
Sodium: 143 mmol/L (ref 135–145)
Sodium: 142 mmol/L (ref 135–145)
TCO2: 39 mmol/L — ABNORMAL HIGH (ref 22–32)
TCO2: 37 mmol/L — ABNORMAL HIGH (ref 22–32)
TCO2: 38 mmol/L — ABNORMAL HIGH (ref 22–32)
TCO2: 36 mmol/L — ABNORMAL HIGH (ref 22–32)
TCO2: 36 mmol/L — ABNORMAL HIGH (ref 22–32)
TCO2: 36 mmol/L — ABNORMAL HIGH (ref 22–32)
TCO2: 35 mmol/L — ABNORMAL HIGH (ref 22–32)
pCO2 arterial: 56.7 mmHg — ABNORMAL HIGH (ref 32–48)
pCO2 arterial: 56.5 mmHg — ABNORMAL HIGH (ref 32–48)
pCO2 arterial: 63.4 mmHg — ABNORMAL HIGH (ref 32–48)
pCO2 arterial: 54.3 mmHg — ABNORMAL HIGH (ref 32–48)
pCO2 arterial: 41.2 mmHg (ref 32–48)
pCO2 arterial: 48.2 mmHg — ABNORMAL HIGH (ref 32–48)
pCO2 arterial: 48.6 mmHg — ABNORMAL HIGH (ref 32–48)
pH, Arterial: 7.429 (ref 7.35–7.45)
pH, Arterial: 7.401 (ref 7.35–7.45)
pH, Arterial: 7.359 (ref 7.35–7.45)
pH, Arterial: 7.406 (ref 7.35–7.45)
pH, Arterial: 7.531 — ABNORMAL HIGH (ref 7.35–7.45)
pH, Arterial: 7.459 — ABNORMAL HIGH (ref 7.35–7.45)
pH, Arterial: 7.45 (ref 7.35–7.45)
pO2, Arterial: 61 mmHg — ABNORMAL LOW (ref 83–108)
pO2, Arterial: 58 mmHg — ABNORMAL LOW (ref 83–108)
pO2, Arterial: 50 mmHg — ABNORMAL LOW (ref 83–108)
pO2, Arterial: 52 mmHg — ABNORMAL LOW (ref 83–108)
pO2, Arterial: 478 mmHg — ABNORMAL HIGH (ref 83–108)
pO2, Arterial: 65 mmHg — ABNORMAL LOW (ref 83–108)
pO2, Arterial: 61 mmHg — ABNORMAL LOW (ref 83–108)

## 2021-06-12 LAB — CBC
HCT: 32.2 % — ABNORMAL LOW (ref 39.0–52.0)
HCT: 34.3 % — ABNORMAL LOW (ref 39.0–52.0)
Hemoglobin: 9.9 g/dL — ABNORMAL LOW (ref 13.0–17.0)
Hemoglobin: 11 g/dL — ABNORMAL LOW (ref 13.0–17.0)
MCH: 29.7 pg (ref 26.0–34.0)
MCH: 30.3 pg (ref 26.0–34.0)
MCHC: 30.7 g/dL (ref 30.0–36.0)
MCHC: 32.1 g/dL (ref 30.0–36.0)
MCV: 96.7 fL (ref 80.0–100.0)
MCV: 94.5 fL (ref 80.0–100.0)
Platelets: 86 10*3/uL — ABNORMAL LOW (ref 150–400)
Platelets: 82 10*3/uL — ABNORMAL LOW (ref 150–400)
RBC: 3.33 MIL/uL — ABNORMAL LOW (ref 4.22–5.81)
RBC: 3.63 MIL/uL — ABNORMAL LOW (ref 4.22–5.81)
RDW: 18 % — ABNORMAL HIGH (ref 11.5–15.5)
RDW: 18.2 % — ABNORMAL HIGH (ref 11.5–15.5)
WBC: 11.1 10*3/uL — ABNORMAL HIGH (ref 4.0–10.5)
WBC: 10.3 10*3/uL (ref 4.0–10.5)
nRBC: 0 % (ref 0.0–0.2)
nRBC: 0 % (ref 0.0–0.2)

## 2021-06-12 LAB — GLUCOSE, CAPILLARY
Glucose-Capillary: 126 mg/dL — ABNORMAL HIGH (ref 70–99)
Glucose-Capillary: 132 mg/dL — ABNORMAL HIGH (ref 70–99)
Glucose-Capillary: 103 mg/dL — ABNORMAL HIGH (ref 70–99)
Glucose-Capillary: 131 mg/dL — ABNORMAL HIGH (ref 70–99)
Glucose-Capillary: 120 mg/dL — ABNORMAL HIGH (ref 70–99)
Glucose-Capillary: 136 mg/dL — ABNORMAL HIGH (ref 70–99)

## 2021-06-12 LAB — LACTATE DEHYDROGENASE: LDH: 202 U/L — ABNORMAL HIGH (ref 98–192)

## 2021-06-12 LAB — FIBRINOGEN: Fibrinogen: 449 mg/dL (ref 210–475)

## 2021-06-12 LAB — HEPATIC FUNCTION PANEL
ALT: 54 U/L — ABNORMAL HIGH (ref 0–44)
AST: 22 U/L (ref 15–41)
Albumin: 2.8 g/dL — ABNORMAL LOW (ref 3.5–5.0)
Alkaline Phosphatase: 78 U/L (ref 38–126)
Bilirubin, Direct: 0.1 mg/dL (ref 0.0–0.2)
Indirect Bilirubin: 0.4 mg/dL (ref 0.3–0.9)
Total Bilirubin: 0.5 mg/dL (ref 0.3–1.2)
Total Protein: 4.8 g/dL — ABNORMAL LOW (ref 6.5–8.1)

## 2021-06-12 LAB — APTT
aPTT: 65 seconds — ABNORMAL HIGH (ref 24–36)
aPTT: 70 seconds — ABNORMAL HIGH (ref 24–36)

## 2021-06-12 LAB — PREPARE RBC (CROSSMATCH)

## 2021-06-12 MED ORDER — MORPHINE SULFATE 15 MG PO TABS
15.0000 mg | ORAL_TABLET | Freq: Four times a day (QID) | ORAL | Status: DC
  Administered 2021-06-12 – 2021-06-17 (×18): 15 mg
  Filled 2021-06-12 (×19): qty 1

## 2021-06-12 MED ORDER — POTASSIUM CHLORIDE 20 MEQ PO PACK
40.0000 meq | PACK | Freq: Once | ORAL | Status: AC
  Administered 2021-06-12: 40 meq
  Filled 2021-06-12: qty 2

## 2021-06-12 MED ORDER — SODIUM CHLORIDE 0.9% IV SOLUTION: Freq: Once | INTRAVENOUS | Status: AC

## 2021-06-12 MED ORDER — ACETAZOLAMIDE 250 MG PO TABS
250.0000 mg | ORAL_TABLET | Freq: Once | ORAL | Status: AC
  Administered 2021-06-12: 250 mg
  Filled 2021-06-12 (×2): qty 1

## 2021-06-12 NOTE — Progress Notes (Signed)
ANTICOAGULATION CONSULT NOTE  Pharmacy Consult for Bivalirudin Indication:  ECMO  Allergies  Allergen Reactions   Amoxicillin-Pot Clavulanate     Per pt report on 08/19/20, makes his urine dark colored   Atorvastatin Other (See Comments)     ( pt states causing runny nose, headaches, issue w/ urination)   Plant Derived Enzymes     Other reaction(s): Unknown   Trichophyton Other (See Comments)    Patient Measurements: Height: 5\' 10"  (177.8 cm) Weight: 80.3 kg (177 lb 0.5 oz) IBW/kg (Calculated) : 73  Vital Signs: Temp: 98.4 F (36.9 C) (02/24 1600) Temp Source: Core (02/24 1600) BP: 153/91 (02/24 1900) Pulse Rate: 79 (02/24 1900)  Labs: Recent Labs    06/10/21 0233 06/10/21 0455 06/11/21 0402 06/11/21 0406 06/11/21 1644 06/11/21 1646 06/12/21 0314 06/12/21 0315 06/12/21 1116 06/12/21 1609 06/12/21 1700  HGB 9.3*   < > 8.4*   < > 10.4*   < > 9.9*   < > 9.2* 11.2* 11.0*  HCT 29.5*   < > 28.2*   < > 33.9*   < > 32.2*   < > 27.0* 33.0* 34.3*  PLT 102*   < > 90*  --  95*  --  86*  --   --   --  82*  APTT 43*   < > 65*  --  60*  --  65*  --   --   --  70*  LABPROT 23.8*  --  24.5*  --   --   --   --   --   --   --   --   INR 2.1*  --  2.2*  --   --   --   --   --   --   --   --   CREATININE 1.01   < > 0.96  --  0.99  --  0.93  --   --   --  0.88   < > = values in this interval not displayed.     Estimated Creatinine Clearance: 79.5 mL/min (by C-G formula based on SCr of 0.88 mg/dL).  Assessment: 71 year old male with coronary artery disease who initially presented with chest pain, noted to have multivessel coronary artery disease. He underwent CABG x3 on 05/04/2021; course was complicated with atrial fibrillation, frequent hiccups and aspiration pneumonia leading to hypoxia and increasing oxygen requirement. Patient now requiring VV ECMO. Pharmacy consulted for bivalirudin IV.  aPTT therapeutic at 70 sec.  Oozing is stable per discussion with RN.  Goal of Therapy:   aPTT 50-80 seconds Monitor platelets by anticoagulation protocol: Yes   Plan:  Continue bivalirudin at 0.075 mg/kg/hr Monitor q12h aptt and CBC Monitor closely for s/sx bleeding/thrombus  Jeffey Janssen D. Mina Marble, PharmD, BCPS, [place] 06/12/2021, 7:14 PM

## 2021-06-12 NOTE — Progress Notes (Signed)
Occupational Therapy Treatment Patient Details Name: Randall Hodges MRN: [id number] DOB: Apr 12, 1951 Today's Date: 06/12/2021   History of present illness 71 yo male with CAD who initially presented with CP, noted to have multivessel CAD, he underwent CABG x3 on 05/11/2021, course was complicated with atrial fibrillation, frequent hiccups and aspiration PNA leading to hypoxia and increasing oxygen requirement. ETT 1/18-1/18. 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 Cortrak 1/30. 2/2 placed on VV ECMO. 2/7 trach. 2/14 ECMO circuit replacement. PMH: prostate CA   OT comments  Upon arrival, pt supine in bed and sleeping. Plan for tilt this AM with ROM exercises and then PT return for possible second tilt or sitting at EOB pending pt arousal. Pt with decreased engagement, not following commands, and not opening his eyes this session. Facilitating BUE ROM exercises and cervical ROM. Pt tolerating tilting to 35-50* for ~20 minutes. VSS throughout. RN and ECMO specialist present throughout. Continue to recommend dc to post-acute rehab pending progress. Will continue to follow acutely as admitted.    Recommendations for follow up therapy are one component of a multi-disciplinary discharge planning process, led by the attending physician.  Recommendations may be updated based on patient status, additional functional criteria and insurance authorization.    Follow Up Recommendations  Acute inpatient rehab (3hours/day)    Assistance Recommended at Discharge Frequent or constant Supervision/Assistance  Patient can return home with the following  A lot of help with walking and/or transfers   Equipment Recommendations  Other (comment) (defer)    Recommendations for Other Services      Precautions / Restrictions Precautions Precautions: Fall;Sternal Precaution Booklet Issued: No Precaution Comments: ECMO, cortrak, foley, aline, sternal precautions, flexiseal, NG tube, trach on  vent Restrictions Weight Bearing Restrictions: Yes Other Position/Activity Restrictions: sternal precautions       Mobility Bed Mobility Overal bed mobility: Needs Assistance             General bed mobility comments: Tilt ~20 minutes with degrees 35-50    Transfers                   General transfer comment: deferred     Balance Overall balance assessment: Needs assistance           Standing balance-Leahy Scale: Zero Standing balance comment: tiltingand no engagement of BLEs. Slight lean to L                           ADL either performed or assessed with clinical judgement   ADL Overall ADL's : Needs assistance/impaired     Grooming: Wash/dry face;Total assistance;Standing Grooming Details (indicate cue type and reason): totla hand over hand to wash eyes. standing in tilt bed at ~45*                               General ADL Comments: Pt tolerating tilting for ~20 minutes. Tilting between 35-50*.    Extremity/Trunk Assessment Upper Extremity Assessment Upper Extremity Assessment: RUE deficits/detail;LUE deficits/detail RUE Deficits / Details: Significant weakness and edema. WFL PROM. Spontaneous mvoement of fingers and then lifting hands over bed RUE Coordination: decreased fine motor;decreased gross motor LUE Deficits / Details: Significant weakness and edema. WFL PROM. Spontaneous mvoement of fingers and then lifting hands over bed LUE Coordination: decreased fine motor;decreased gross motor   Lower Extremity Assessment Lower Extremity Assessment: Defer to PT evaluation  Vision       Perception     Praxis      Cognition Arousal/Alertness: Lethargic (Keep eyes closed; spontaneous movement at hands) Behavior During Therapy: Flat affect Overall Cognitive Status: Difficult to assess                                 General Comments: Pt not opening his eyes thorughotu tilting. moments with  spontaneous movement of hands        Exercises Exercises: General Upper Extremity, Other exercises General Exercises - Upper Extremity Shoulder Flexion: PROM, Both, 10 reps (Kreg bed at 40*) Shoulder ABduction: PROM, 10 reps, Both (Kreg bed at 40*) Shoulder ADduction: PROM, 10 reps, Both (Kreg bed at 40*) Elbow Flexion: PROM, Both, 10 reps (Kreg bed at 40*) Elbow Extension: PROM, Both, 10 reps (Kreg bed at 40*) Wrist Flexion: PROM, Both, 10 reps (Kreg bed at 40*) Wrist Extension: PROM, Both, 10 reps (Kreg bed at 40*) Digit Composite Flexion: PROM, 10 reps, Both (Kreg bed at 40*) Composite Extension: PROM, Both, 10 reps (Kreg bed at 40*) Other Exercises Other Exercises: PNF D1 pattern; x10; BUEs; Kreg bed at 40* Other Exercises: cervical rotation; PROM; x5; Kreg bed at 40*    Shoulder Instructions       General Comments VSS on vent/trach.    Pertinent Vitals/ Pain       Pain Assessment Pain Assessment: Faces Faces Pain Scale: Hurts little more Pain Location: generalized grimacing with coughing Pain Descriptors / Indicators: Grimacing Pain Intervention(s): Monitored during session, Repositioned  Home Living                                          Prior Functioning/Environment              Frequency  Min 2X/week        Progress Toward Goals  OT Goals(current goals can now be found in the care plan section)  Progress towards OT goals: Progressing toward goals  Acute Rehab OT Goals OT Goal Formulation: Patient unable to participate in goal setting Time For Goal Achievement: 06/19/21 Potential to Achieve Goals: Good ADL Goals Pt Will Perform Grooming: with mod assist;sitting Pt Will Transfer to Toilet: with max assist;with +2 assist;stand pivot transfer Additional ADL Goal #1: Pt will follow one step commands 50% of session with Min cues Additional ADL Goal #2: Pt will tolerate 3 minutes sitting at EOB with Mod A in preparation for ADLs   Plan Discharge plan remains appropriate    Co-evaluation                 AM-PAC OT "6 Clicks" Daily Activity     Outcome Measure   Help from another person eating meals?: Total Help from another person taking care of personal grooming?: Total Help from another person toileting, which includes using toliet, bedpan, or urinal?: Total Help from another person bathing (including washing, rinsing, drying)?: Total Help from another person to put on and taking off regular upper body clothing?: Total Help from another person to put on and taking off regular lower body clothing?: Total 6 Click Score: 6    End of Session Equipment Utilized During Treatment: Oxygen  OT Visit Diagnosis: Muscle weakness (generalized) (M62.81)   Activity Tolerance Patient limited by fatigue   Patient Left with call bell/phone  within reach;in bed;with nursing/sitter in room (chair position)   Nurse Communication Mobility status;Precautions        Time: [id number] OT Time Calculation (min): 38 min  Charges: OT General Charges $OT Visit: 1 Visit OT Treatments $Self Care/Home Management : 8-22 mins $Therapeutic Activity: 23-37 mins  [place], OTR/L Acute Rehab Pager: [phone] Office: [place] 06/12/2021, 10:27 AM

## 2021-06-12 NOTE — Progress Notes (Signed)
°   06/12/21 0733  Tracheostomy Shiley XLT Distal 6 mm Cuffed;Distal  Placement Date/Time: 06/02/21 1530   Placed By: (c) ICU physician  Brand: Shiley XLT Distal  Size (mm): 6 mm  Style: Cuffed;Distal  Status Secured  Site Assessment Clean  Ties Assessment Clean, Dry, Secure  Cuff Pressure (cm H2O) Clear OR 27-39 CmH2O  Tracheostomy Equipment at bedside Yes and checklist posted at head of bed  Adult Ventilator Settings  Vent Type Servo i  Humidity HME  Vent Mode PCV  Set Rate 10 bmp  FiO2 (%) 30 %  I Time 1 Sec(s)  Pressure Control 10 cmH20  PEEP 10 cmH20  Adult Ventilator Measurements  Peak Airway Pressure 20 L/min  Mean Airway Pressure 12 cmH20  Plateau Pressure 20 cmH20  Resp Rate Spontaneous 0 br/min  Resp Rate Total 10 br/min  Exhaled Vt 80 mL  Measured Ve 0.9 mL  I:E Ratio Measured 1:2.7  Auto PEEP 0 cmH20  Total PEEP 10 cmH20  SpO2 95 %  Adult Ventilator Alarms  Alarms On Y  Ve High Alarm 5 L/min  Ve Low Alarm 0.5 L/min  Resp Rate High Alarm 35 br/min  Resp Rate Low Alarm 8  PEEP Low Alarm 8 cmH2O  Press High Alarm 12 cmH2O  T Apnea 20 sec(s)  Daily Weaning Assessment  Daily Assessment of Readiness to Wean Wean protocol criteria not met  Reason not met PEEP > 8  Breath Sounds  Bilateral Breath Sounds Diminished

## 2021-06-12 NOTE — Progress Notes (Signed)
NAME:  Randall Hodges, MRN:  [id number], DOB:  [birth year], LOS: 27 ADMISSION DATE:  05/07/2021, CONSULTATION DATE: 04/27/2021 REFERRING MD:  Dahlia Byes, MD, CHIEF COMPLAINT: Increasing shortness of breath  History of Present Illness:  71 year old male with coronary artery disease who initially presented with chest pain, noted to have multivessel coronary artery disease, he underwent CABG x3 on 04/1818 23, course was complicated with atrial fibrillation, frequent hiccups and aspiration pneumonia leading to hypoxia and increasing oxygen requirement. PCCM was consulted for evaluation and help with management  Patient stated hiccups are better but continued complain of shortness of breath, cough unable to bring up phlegm.  Pertinent  Medical History  CAD Allergic rhinitis Prostate cancer GERD Pulmonary fibrosis - mild PFT normal.   Significant Hospital Events: Including procedures, antibiotic start and stop dates in addition to other pertinent events   1/18 CABG 1/30 PCCM consult, Worsening O2 needs, likely aspiration, new pressor need 2/2 intubation, placed on VV ECMO 2/5 issues with recirc, bronch neg, improved with supine positioning 2/7 percutaneous tracheostomy 2/10 diuresis and weaning sedation. CT chest shows diffuse ground-glass opacification.  2/11 weaning sedation 2/13 increasing air hunger as oxygenator efficiency decreasing.  2/14 oxygenator changed.  Still having air hunger but able to follow commands and participate with therapy.  2/15 considerable distress with double stacking. Switched to true rest settings - with improvement in distress.  2/16 bronch, NGT placed to LIWS, con't post-pyloric TF 2/17 DCCV overnight for Afib with RVR. Stood at bedside with PT.  Interim History / Subjective:  Subcutaneous air developed after attempt at recruitment. Seems to have improved spontaneously overnight with reduction in PEEP. Able to stand with physiotherapy a few days  ago.  Objective   Blood pressure 131/71, pulse 75, temperature 98 F (36.7 C), temperature source Axillary, resp. rate (!) 0, height 5\' 10"  (1.778 m), weight 80.3 kg, SpO2 91 %. CVP:  [4 mmHg-33 mmHg] 11 mmHg  Vent Mode: PCV FiO2 (%):  [30 %-60 %] 30 % Set Rate:  [10 bmp] 10 bmp PEEP:  [10 cmH20] 10 cmH20 Pressure Support:  [10 cmH20] 10 cmH20 Plateau Pressure:  [16 cmH20-20 cmH20] 20 cmH20   Intake/Output Summary (Last 24 hours) at 06/12/2021 1143 Last data filed at 06/12/2021 1100 Gross per 24 hour  Intake 2547.97 ml  Output 4420 ml  Net -1872.03 ml    Filed Weights   06/10/21 0500 06/11/21 0500 06/12/21 0450  Weight: 87.1 kg 82.4 kg 80.3 kg   Examination: More comfortable breathing pattern this AM Lungs with poor air movement, triggering vent, on PCV 10/10 tidal volumes remain 70 to 100 mL Ext warm, no ectopy on monitor at present ECMO circuit with minimal clot, good color change Moves all 4 ext to command  Ancillary tests personally reviewed:   Chest x-ray shows persistent bilateral interstitial opacifications Mild persistent hypercarbia with normal pH on 5 sweep.  Assessment & Plan:   Acute hypoxic and hypercapnic respiratory failure with hypoxia ARDS Aspiration pneumonitis- recurrent, now with NGT to suction, on reglan, getting postpyloric feeds Hiccups, recurrent aspiration episodes.  ILD, mild- question of chronic aspiration syndrome Agitation, ICU delirium, and air hunger- improved today CAD with UA s/p CABG x 3, (LIMA-LAD, SVG-OM, SVG-left PDA) Afib with RVR, DCCV, NSVT, ectopy- on amio gtt Hypertension Possible GPC bacteremia vs contaminant- staph epi in 2/4 bottles- s/p 7 days vanc - Lung protective TV - Sweep wean to pH > 7.35 and/or WOB - NG to LIS, cortrak for PP  TF, reglan - VV ECMO and diuresis management per CHF team, diamox and reduce lasix gtt today -con't baclofen, morphine, seroquel, klonipin, melatonin and gabapentin; fent drip weaned off  today - colchicine per TCTS - bowel regimen as ordered, rectal management system in place, stool bulkener to be ordered by PharmD - Continue PT/OT efforts - PPI BID - Finished trial of steroids, did not seem to have effect - Aspirin, statin - Holding coreg due to bradycardia - Continue IV amiodarone for frequent ectopy and questionable GI absorption - Hydralazine PRN; can add scheduled if needed  Continue current aggressive support until 2/27 and get a repeat CT that day.  If no improvement in GGO or there are fibrotic changes, we will need to consider setting a deadline for continued ECMO support due to futility.  Discussed with CHF/TCTS teams and family.  Best Practice (right click and "Reselect all SmartList Selections" daily)   Diet/type: TF DVT prophylaxis: Bival CBG: SSI GI prophylaxis: PPI Lines: L  TLC, RIJ  Foley:  Yes Code Status:  full code Last date of multidisciplinary goals of care discussion [2/16]   CRITICAL CARE Performed by: Kipp Brood  Total critical care time: 40 minutes  Critical care time was exclusive of separately billable procedures and treating other patients.  Critical care was necessary to treat or prevent imminent or life-threatening deterioration.  Critical care was time spent personally by me on the following activities: development of treatment plan with patient and/or surrogate as well as nursing, discussions with consultants, evaluation of patient's response to treatment, examination of patient, obtaining history from patient or surrogate, ordering and performing treatments and interventions, ordering and review of laboratory studies, ordering and review of radiographic studies, pulse oximetry, re-evaluation of patient's condition and participation in multidisciplinary rounds.  Kipp Brood, MD [facility] ICU Physician [place]  Pager: [phone] Mobile: [phone] After hours: [phone].  Prefer epic messenger for  cross cover needs If after hours, please call E-link

## 2021-06-12 NOTE — Progress Notes (Signed)
ANTICOAGULATION CONSULT NOTE  Pharmacy Consult for Bivalirudin Indication:  ECMO  Allergies  Allergen Reactions   Amoxicillin-Pot Clavulanate     Per pt report on 08/19/20, makes his urine dark colored   Atorvastatin Other (See Comments)     ( pt states causing runny nose, headaches, issue w/ urination)   Plant Derived Enzymes     Other reaction(s): Unknown   Trichophyton Other (See Comments)    Patient Measurements: Height: 5\' 10"  (177.8 cm) Weight: 80.3 kg (177 lb 0.5 oz) IBW/kg (Calculated) : 73  Vital Signs: Temp: 98 F (36.7 C) (02/24 0300) Temp Source: Axillary (02/24 0300) BP: 155/71 (02/24 0600) Pulse Rate: 77 (02/24 0700)  Labs: Recent Labs    06/10/21 0233 06/10/21 0455 06/11/21 0402 06/11/21 0406 06/11/21 1644 06/11/21 1646 06/11/21 1933 06/12/21 0314 06/12/21 0315  HGB 9.3*   < > 8.4*   < > 10.4*   < > 11.2* 9.9* 9.5*  HCT 29.5*   < > 28.2*   < > 33.9*   < > 33.0* 32.2* 28.0*  PLT 102*   < > 90*  --  95*  --   --  86*  --   APTT 43*   < > 65*  --  60*  --   --  65*  --   LABPROT 23.8*  --  24.5*  --   --   --   --   --   --   INR 2.1*  --  2.2*  --   --   --   --   --   --   CREATININE 1.01   < > 0.96  --  0.99  --   --  0.93  --    < > = values in this interval not displayed.     Estimated Creatinine Clearance: 75.2 mL/min (by C-G formula based on SCr of 0.93 mg/dL).  Assessment: 71 year old male with coronary artery disease who initially presented with chest pain, noted to have multivessel coronary artery disease. He underwent CABG x3 on 05/14/2021; course was complicated with atrial fibrillation, frequent hiccups and aspiration pneumonia leading to hypoxia and increasing oxygen requirement. Patient now requiring VV ECMO. Pharmacy consulted for bivalirudin IV.  aPTT therapeutic at 65 sec on bivalirudin drip 0.075mg /kg/hr hgb stable 9, pltc dropped 80s LDH stable 200s, fibrinogen 400s   Oozing from NG tube and cannulation site -stable.  Goal of  Therapy:  aPTT 50-80 seconds Monitor platelets by anticoagulation protocol: Yes   Plan:  Continue bivalirudin at 0.075 mg/kg/hr Monitor q12h aptt and CBC Monitor closely for s/sx bleeding/thrombus   Bonnita Nasuti Pharm.D. CPP, BCPS Clinical Pharmacist [phone] 06/12/2021 7:23 AM

## 2021-06-12 NOTE — Progress Notes (Signed)
Physical Therapy Treatment Patient Details Name: Randall Hodges MRN: [id number] DOB: 1950-06-26 Today's Date: 06/12/2021   History of Present Illness 71 yo male with CAD who initially presented with CP, noted to have multivessel CAD, he underwent CABG x3 on 04/28/2021, course was complicated with atrial fibrillation, frequent hiccups and aspiration PNA leading to hypoxia and increasing oxygen requirement. ETT 1/18-1/18. 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 Cortrak 1/30. 2/2 placed on VV ECMO. 2/7 trach. 2/14 ECMO circuit replacement. PMH: prostate CA    PT Comments    Pt was able to follow a few simple commands at this time, like squeeze hands, stick out tongue, and shake/nod head. Pt kept eyes closed majority of time but spontaneously opened them briefly with noxious movements. Pt still requiring TA x3 for rolling and transitioning supine <> sit EOB. However, he did display brief moments of cervical extensors and abdominal musculature activation sitting EOB. Pt also displayed a couple triceps activation bil to push up from elbow sitting EOB. Pt did not follow commands for moving lower extremities though. Will continue to follow acutely. Hopeful for pt to progress to being able to participate more and eventually go to AIR.   Recommendations for follow up therapy are one component of a multi-disciplinary discharge planning process, led by the attending physician.  Recommendations may be updated based on patient status, additional functional criteria and insurance authorization.  Follow Up Recommendations  Acute inpatient rehab (3hours/day)     Assistance Recommended at Discharge Frequent or constant Supervision/Assistance  Patient can return home with the following A lot of help with walking and/or transfers;A lot of help with bathing/dressing/bathroom;Two people to help with walking and/or transfers;Two people to help with bathing/dressing/bathroom;Assistance with feeding;Assistance  with cooking/housework;Direct supervision/assist for medications management;Direct supervision/assist for financial management;Assist for transportation;Help with stairs or ramp for entrance   Equipment Recommendations  BSC/3in1;Wheelchair (measurements PT);Wheelchair cushion (measurements PT);Hospital bed;Other (comment) (hoyer lift)    Recommendations for Other Services       Precautions / Restrictions Precautions Precautions: Fall;Sternal Precaution Booklet Issued: No Precaution Comments: ECMO, cortrak, foley, aline, sternal precautions, flexiseal, NG tube, trach on vent Restrictions Weight Bearing Restrictions: Yes Other Position/Activity Restrictions: sternal precautions     Mobility  Bed Mobility Overal bed mobility: Needs Assistance Bed Mobility: Supine to Sit, Sit to Supine, Rolling Rolling: Total assist, +2 for physical assistance, +2 for safety/equipment   Supine to sit: Total assist, +2 for physical assistance, +2 for safety/equipment, HOB elevated Sit to supine: Total assist, +2 for physical assistance, +2 for safety/equipment, HOB elevated   General bed mobility comments: TAx3 for line management and to manage legs and trunk to transition supine <> sit L EOB with HOB elevated. Pt not following cues to advance legs off EOB. TAx3 to roll either direction also.    Transfers                   General transfer comment: deferred    Ambulation/Gait               General Gait Details: unable   Stairs             Wheelchair Mobility    Modified Rankin (Stroke Patients Only)       Balance Overall balance assessment: Needs assistance Sitting-balance support: Bilateral upper extremity supported, No upper extremity supported, Feet supported Sitting balance-Leahy Scale: Zero Sitting balance - Comments: Pt requiring TA to sit EOB initially with posterior lean, having RN block pt's knees  and place feet on stool to increase hip flexion to reduce  risk of sliding off bed. Progressed to maxA to sit EOB intermittently when dependently propped his hands/elbows on his knees or placed pt leaning laterally onto either elbow. Provided verbal and tactile cues to flex anteriorly for improved upright balance, x2 attempts to reactionally regain balance with posterior LOB once support briefly removed. Cues to adduct scapula but no activation noted. Pt would intermittently lift his head though. Leaned pt L and R onto elbows 3x with pt pushing up 1-2x from either side briefly. Postural control: Posterior lean     Standing balance comment: unable                            Cognition Arousal/Alertness: Lethargic Behavior During Therapy: Flat affect Overall Cognitive Status: Difficult to assess                                 General Comments: Pt with eyes closed majority of session, intermitently briefly opening eyes with trunk PROM sitting EOB. Pt with sponatenous UE movements and also purposeful intermitently. Followed cues to squeeze hands, stick out tongue, and nod/shake head. Attempted to communicate with shaking head a few times. Intermittent reactional strategies to keep balance sitting EOB, but delayed and  fatigued quickly.        Exercises Other Exercises Other Exercises: PROM trunk rotation 1x each direction seated EOB Other Exercises: attempts to activate core, rhomboids, and neck extensors, no success at rhomboids but intermittent success elsewhere when sitting EOB Other Exercises: Activated triceps in bil UEs through cuing pt to push up from elbow 3x sitting EOB, successful 1-2x each side with assistance    General Comments General comments (skin integrity, edema, etc.): VSS on vent/trach, SpO2 decreased to 80s% but RN reports this is how he fluctuates      Pertinent Vitals/Pain Pain Assessment Pain Assessment: Faces Faces Pain Scale: Hurts little more Pain Location: generalized grimacing with  coughing Pain Descriptors / Indicators: Grimacing Pain Intervention(s): Limited activity within patient's tolerance, Monitored during session, Repositioned    Home Living                          Prior Function            PT Goals (current goals can now be found in the care plan section) Acute Rehab PT Goals Patient Stated Goal: did not state, but did shake head "no" a few times PT Goal Formulation: Patient unable to participate in goal setting Time For Goal Achievement: 06/19/21 Potential to Achieve Goals: Fair Progress towards PT goals: Progressing toward goals (slowly)    Frequency    Min 3X/week      PT Plan Current plan remains appropriate    Co-evaluation              AM-PAC PT "6 Clicks" Mobility   Outcome Measure  Help needed turning from your back to your side while in a flat bed without using bedrails?: Total Help needed moving from lying on your back to sitting on the side of a flat bed without using bedrails?: Total Help needed moving to and from a bed to a chair (including a wheelchair)?: Total Help needed standing up from a chair using your arms (e.g., wheelchair or bedside chair)?: Total Help needed to walk in hospital  room?: Total Help needed climbing 3-5 steps with a railing? : Total 6 Click Score: 6    End of Session Equipment Utilized During Treatment: Oxygen Activity Tolerance: Patient tolerated treatment well Patient left: in bed;with call bell/phone within reach;with nursing/sitter in room Nurse Communication: Mobility status PT Visit Diagnosis: Muscle weakness (generalized) (M62.81);Other abnormalities of gait and mobility (R26.89);Difficulty in walking, not elsewhere classified (R26.2);Unsteadiness on feet (R26.81)     Time: 2589-4834 PT Time Calculation (min) (ACUTE ONLY): 31 min  Charges:  $Therapeutic Activity: 8-22 mins $Neuromuscular Re-education: 8-22 mins                     Moishe Spice, PT, DPT Acute  Rehabilitation Services  Pager: [phone] Office: [phone]    Orvan Falconer 06/12/2021, 4:32 PM

## 2021-06-12 NOTE — Progress Notes (Signed)
Patient ID: Randall Hodges, male   DOB: 1951/02/02, 71 y.o.   MRN: [id number]    Progress Note  Patient Name: Randall Hodges Date of Encounter: 06/12/2021  [facility] Cardiologist: [name], MD   Subjective   2/2: VV ECMO initiation, Crescent catheter right IJ 2/7: Tracheostomy 2/13 CT C/A/P: Diffuse ground-glass opacities consistent with atelectasis and edema presumably related to ECMO physiology and respiratory failure.  Underlying moderate interstitial lung disease mildly progressed from prior CT. 2/15: Oxygenator changed, trach changed.  2/16: Transient asystole with cough and adjustment of tracheostomy overnight.  2/17: Atrial flutter with RVR, required emergent DCCV and amiodarone restarted 2/23: Pneumomediastinum with high PEEP, PEEP decreased  CXR with severe bilateral diffuse disease, starting to improve, subcutaneous air less this morning. Off antibiotics.  Suspect aspiration again with TFs in trach on 2/16, NGT placed to suction and also has post-pyloric tube for feeds.   Vent FiO2 0.3. CVP 10 this morning, on Lasix gtt at 4 with excellent diuresis (net negative 1616 L, weight down).  Creatinine and BUN stable.  No chugging.   NSR this morning on amiodarone gtt.   Patient has been working with PT.   Hgb stable 8.9  ECMO: Speed 3300 rpm Flow 4.4 L/min pVen -80 DeltaP 25 Sweep 5 PTT 65 (goal 50-80) LDH 202 ABG 7.43/57/61/91% Lactate 1.0  Cardiac Studies: Echo (limited, 1/30): Echo reviewed, there is clear respirophasic variation of the interventricular septum and marked respirophasic variation of E inflow velocity on doppler evaluation of the mitral valve.  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  LV EF 45-50%.   RHC Procedural Findings (on norepinephrine  6): Hemodynamics (mmHg) RA mean 12 RV 37/12 PA 38/16, mean 27 PCWP mean 11 LV 108/12 AO 96/55 PAPI 1.8 Oxygen saturations: PA 54% AO 94% Cardiac Output (Fick) 5.59  Cardiac Index (Fick) 2.81  PVR 2.8 WU Simultaneous RV/LV tracings were obtained.  Difficult to interpret due to atrial fibrillation.  There was some suggestion of discordance (ventricular interdependence) but not clear.  Inpatient Medications    Scheduled Meds:  acetaZOLAMIDE  250 mg Oral Once   aspirin  81 mg Per Tube Daily   baclofen  5 mg Per Tube TID   chlorhexidine gluconate (MEDLINE KIT)  15 mL Mouth Rinse BID   Chlorhexidine Gluconate Cloth  6 each Topical Daily   clonazePAM  1 mg Per Tube BID   colchicine  0.6 mg Per Tube Daily   feeding supplement (PROSource TF)  90 mL Per Tube BID   fiber  1 packet Per Tube TID   free water  200 mL Per Tube Q4H   gabapentin  300 mg Per Tube QHS   Gerhardt's butt cream   Topical TID   haloperidol lactate  5 mg Intravenous Once   insulin aspart  0-15 Units Subcutaneous Q4H   mouth rinse  15 mL Mouth Rinse 10 times per day   melatonin  5 mg Per Tube QHS   metoCLOPramide (REGLAN) injection  5 mg Intravenous Q8H   morphine  30 mg Per Tube Q6H   pantoprazole (PROTONIX) IV  40 mg Intravenous Q12H   potassium chloride  40 mEq Per Tube BID   QUEtiapine  25 mg Per Tube QHS   rosuvastatin  10 mg Per Tube Daily   sodium chloride flush  10-40 mL Intracatheter Q12H  sodium chloride flush  10-40 mL Intracatheter Q12H   sodium chloride flush  3 mL Intravenous Q12H   Continuous Infusions:  sodium chloride Stopped (06/01/21 1121)   sodium chloride Stopped (06/10/21 1705)   sodium chloride 10 mL/hr at 06/11/21 1816   albumin human 12.5 g (06/10/21 2101)   albumin human 12.5 g (06/08/21 1820)   amiodarone 30 mg/hr (06/12/21 0700)   bivalirudin (ANGIOMAX) infusion 0.5 mg/mL (Non-ACS indications) 0.075 mg/kg/hr (06/12/21 0700)   dexmedetomidine (PRECEDEX) IV infusion Stopped  (06/05/21 0655)   feeding supplement (VITAL 1.5 CAL) 60 mL/hr at 06/12/21 0700   fentaNYL infusion INTRAVENOUS Stopped (06/12/21 0630)   furosemide (LASIX) 200 mg in dextrose 5% 100 mL (13m/mL) infusion 4 mg/hr (06/12/21 0700)   PRN Meds: Place/Maintain arterial line **AND** sodium chloride, sodium chloride, sodium chloride, acetaminophen (TYLENOL) oral liquid 160 mg/5 mL, albumin human, albumin human, diphenhydrAMINE, docusate, fentaNYL, fentaNYL (SUBLIMAZE) injection, fentaNYL (SUBLIMAZE) injection, guaiFENesin, hydrALAZINE, levalbuterol, lip balm, ondansetron (ZOFRAN) IV, polyvinyl alcohol, sennosides, sodium chloride flush, white petrolatum   Vital Signs    Vitals:   06/12/21 0500 06/12/21 0600 06/12/21 0700 06/12/21 0733  BP: (!) 159/72 (!) 155/71    Pulse: 78 76 77   Resp: _0 Temp:      TempSrc:      SpO2: 94% 92% 93% 95%  Weight:      Height:        Intake/Output Summary (Last 24 hours) at 06/12/2021 0742 Last data filed at 06/12/2021 0700 Gross per 24 hour  Intake 2938.48 ml  Output 4555 ml  Net -1616.52 ml   Last 3 Weights 06/12/2021 06/11/2021 06/10/2021  Weight (lbs) 177 lb 0.5 oz 181 lb 10.5 oz 192 lb 0.3 oz  Weight (kg) 80.3 kg 82.4 kg 87.1 kg      Telemetry    NSR, personally reviewed.   Physical Exam   General: NAD Neck: Crescent catheter in RIJ, no thyromegaly or thyroid nodule.  Lungs: Crackles at bases.  CV: Nondisplaced PMI.  Heart regular S1/S2, no S3/S4, no murmur.  1+ edema to knees.  Abdomen: Soft, nontender, no hepatosplenomegaly, no distention.  Skin: Intact without lesions or rashes.  Neurologic: Follows commands.  Extremities: No clubbing or cyanosis.  HEENT: Normal.     Labs    High Sensitivity Troponin:   Recent Labs  Lab 06/05/21 2208 06/06/21 0037 06/06/21 0532  TROPONINIHS 211* 226* 196*      Chemistry Recent Labs  Lab 06/08/21 0834102/20/23 0804 06/10/21 0233 06/10/21 0455 06/11/21 0402 06/11/21 0406  06/11/21 1644 06/11/21 1646 06/11/21 1933 06/12/21 0314 06/12/21 0315  NA  --    < > 148*   < > 146*   < > 148*   < > 146* 146* 144  K  --    < > 4.1   < > 4.3   < > 4.2   < > 4.1 4.1 4.1  CL  --    < > 103   < > 103  --  103  --   --  104  --   CO2  --    < > 36*   < > 36*  --  36*  --   --  35*  --   GLUCOSE  --    < > 135*   < > 145*  --  182*  --   --  124*  --   BUN  --    < >  50*   < > 48*  --  48*  --   --  47*  --   CREATININE  --    < > 1.01   < > 0.96  --  0.99  --   --  0.93  --   CALCIUM  --    < > 8.1*   < > 8.3*  --  8.5*  --   --  8.4*  --   MG 2.1  --  2.0  --  2.2  --   --   --   --   --   --   PROT  --    < > 4.8*  --  4.8*  --   --   --   --  4.8*  --   ALBUMIN  --    < > 2.9*  --  3.0*  --   --   --   --  2.8*  --   AST  --    < > 31  --  26  --   --   --   --  22  --   ALT  --    < > 70*  --  60*  --   --   --   --  54*  --   ALKPHOS  --    < > 78  --  77  --   --   --   --  78  --   BILITOT  --    < > 0.5  --  0.6  --   --   --   --  0.5  --   GFRNONAA  --    < > >60   < > >60  --  >60  --   --  >60  --   ANIONGAP  --    < > 9   < > 7  --  9  --   --  7  --    < > = values in this interval not displayed.    Lipids  No results for input(s): CHOL, TRIG, HDL, LABVLDL, LDLCALC, CHOLHDL in the last 168 hours.   Hematology Recent Labs  Lab 06/11/21 0402 06/11/21 0406 06/11/21 1644 06/11/21 1646 06/11/21 1933 06/12/21 0314 06/12/21 0315  WBC 11.3*  --  14.6*  --   --  11.1*  --   RBC 2.85*  --  3.51*  --   --  3.33*  --   HGB 8.4*   < > 10.4*   < > 11.2* 9.9* 9.5*  HCT 28.2*   < > 33.9*   < > 33.0* 32.2* 28.0*  MCV 98.9  --  96.6  --   --  96.7  --   MCH 29.5  --  29.6  --   --  29.7  --   MCHC 29.8*  --  30.7  --   --  30.7  --   RDW 17.9*  --  17.9*  --   --  18.0*  --   PLT 90*  --  95*  --   --  86*  --    < > = values in this interval not displayed.   Thyroid No results for input(s): TSH, FREET4 in the last 168 hours.  BNPNo results for input(s):  BNP, PROBNP in the last 168 hours.  DDimer No results for input(s): DDIMER in the last 168 hours.   Radiology  DG Abd 1 View  Result Date: 06/10/2021 CLINICAL DATA:  Ileus. EXAM: ABDOMEN - 1 VIEW COMPARISON:  Chest radiograph 06/10/2021 FINDINGS: Redemonstrated opacities within the lung bases bilaterally. Incompletely visualized ECMO cannula. Enteric tube projects over the proximal small bowel. Gas is demonstrated within nondilated loops of large and small bowel without obstructive pattern. Lumbar spine degenerative changes. IMPRESSION: Enteric tube projects over the left upper quadrant, likely in the proximal small bowel. No dilated loops of bowel identified on current exam. Electronically Signed   By: Lovey Newcomer M.D.   On: 06/10/2021 11:09   DG CHEST PORT 1 VIEW  Result Date: 06/11/2021 CLINICAL DATA:  Transfusion related acute lung injury. EXAM: PORTABLE CHEST 1 VIEW COMPARISON:  06/11/2021 FINDINGS: Diffuse lung densities with marked consolidation in the mid and lower lungs, left side greater than right. Lung findings have minimally changed. Heart size is grossly stable with prior median sternotomy and post CABG changes. There is a tracheostomy tube. Nasogastric tube terminates in the upper abdomen and feeding tube extends beyond the image. Again noted is a large bore ECMO cannula that appears to be extending from the right jugular vein into the IVC. Again noted is displaced lateral right first rib fracture. Negative for pneumothorax. Right arm PICC line probably terminates in the SVC but difficult to evaluate due to the ECMO cannula. Increased subcutaneous gas in the left chest. Again noted is subcutaneous gas in the right supraclavicular region. IMPRESSION: 1. No significant change in the bilateral parenchymal lung disease. Extensive consolidation / airspace disease in the mid and lower lungs bilaterally. 2. Increased subcutaneous gas particularly in the left chest. No significant pneumothorax  identified. 3. Support apparatuses as described, including ECMO cannula. Electronically Signed   By: Markus Daft M.D.   On: 06/11/2021 14:10   DG CHEST PORT 1 VIEW  Result Date: 06/11/2021 CLINICAL DATA:  ECMO EXAM: PORTABLE CHEST 1 VIEW COMPARISON:  06/10/2021 FINDINGS: Tracheostomy tube remains in place. Stable cardiomegaly with post CABG changes. ECMO cannula unchanged. NG tube terminates within the stomach. Additional enteric tube extends beyond the inferior margin of the film. Stable right-sided PICC line. Widespread bilateral airspace opacities, similar to prior. Persistent bilateral pleural effusions. No appreciable pneumothorax. Right second rib fracture. Subcutaneous emphysema at the base of the neck on the right. IMPRESSION: 1. Stable bilateral airspace opacities and bilateral pleural effusions. 2. Stable support lines and tubes. Electronically Signed   By: Davina Poke D.O.   On: 06/11/2021 08:34    Cardiac Studies   Cath 04/29/2021 Distal left main [place] 111 bifurcation stenosis with 75% left main, 90% ostial to proximal LAD, and 80-90% ostial circumflex (difficult to assess due to heavy calcification). Severe mid circumflex disease with 70% eccentric mid stenosis and second obtuse marginal containing ostial to proximal greater than 80% stenosis.  (Bifurcation [place] 111 Severe calcification in left main and LAD in particular with diffuse 50% narrowing from proximal to mid vessel and tandem 70% stenoses in the mid LAD. Nondominant right coronary Normal LV function.  EF 55%.  LVEDP normal.    Patient Profile     71 y.o. male with PMH of PVCs presented with chest pain. Cardiac cath by Dr. Tamala Julian on 04/25/2021 showed 75% left main, 90% ost to prox LAD, 80-90% ost LCx, 70% mid LCx, 80% OM2, 50% prox to mid LAD, 70% mid LAD, EF 55%. Patient underwent CABG x 3 on 05/07/2021. Post op course complicated afib, treated with amio. CXR showed opacity in bilateral lung, started  abx on 1/25 and  diuretic. Started on Eliquis due to recurrence of afib.    Assessment & Plan    1. CAD: Admitted with unstable angina, cath with severe left main and proximal LAD/LCx disease (nondominant RCA).  CABG x 3 on 1/18 with LIMA-LAD, SVG-OM, SVG-left PDA.   - Continue ASA 81, statin.  2. 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 RHC 1/30 with equalization of diastolic pressures.  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  Repeated echo 2/2 still showed respirophasic septal variation but not as impressive. CVP 10 today.  Lungs still with diffuse bilateral infiltrates on CXR but improving gradually.  Diuresed well with IV Lasix gtt yesterday, BUN/creatinine stable.  - Continue Lasix 4 mg/hr and give acetazolamide 250 mg po x 1 today.  - With concern for development of post-surgical effusive/constrictive pericarditis, he is on colchicine.  - ?need for surgical intervention on effusive/constrictive pericarditis. Will need improvement of lung disease first then reassess, repeat echo not as impressive.   3. 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  Suspect primarily septic/vasodilatory shock. He is now off pressors.  4. Hiccups: Intractable initially, now improved.  - On baclofen and gabapentin.  5. PNA with acute hypoxic respiratory failure: Of note, he does appear to have had some pre-existing ILD from 2019 CT chest (?sarcoidosis). CXR with persistent bilateral infiltrates, possible mild improvement compared to yesterday.  Have thought most likely  aspiration PNA/pneumonitis in setting of intractable hiccups. He has developed ARDS. COVID was negative.  FiO2 0.5 on vent.  Has tracheostomy. CT chest 2/9 with persistent lung infiltrates.  Completed vancomycin and meropenem => MRSE 1/2 blood and trach aspirate, now back on vancomycin.  Suspect ongoing aspiration with TFs noted in trach on 2/16, now with NGT to suction and post-pyloric feeding tube.  LDH stable today. Stable ECMO circuit.  FiO2 0.3  Stable ABG. Sweep 5.  CXR with stable disease today though some improvement over the last several days. Had subcutaneous air with more aggressive PEEP, now back to lung protective ventilation.   - Vent and sedation per CCM.  - Solumedrol 20 mg IV daily with gradual taper, ESR now normal.  - Back on amiodarone but suspect not amiodarone lung toxicity.   - Now off antibiotics.  - Repeat CT chest next Monday to look for any improvement.  At that point, will need discussions about future plan.  6. Atypical atrial flutter/PVCs:  DCCV 2/17.  NSR this morning.   - Continue IV amiodarone.    - Bivalirudin gtt, goal PTT 50-80.  7. AKI: Creatinine stable 0.93. Follow closely.  8. Elevated LFTs: Suspect shock liver, follow CMET.  LFTs have trended down.  9. Anemia: Hgb stable today, transfuse < 8.     10. Hypernatremia: Na 146 today.  - Continue free water 200 q4.  11. HTN: BP rises with sedation wean.  - prn hydralazine for now.  12. Bradycardia/asystole: Vagally mediated with cough.  Resolves rapidly with resolution of cough.  - Atropine at bedside.  13. FEN: TFs restarted with post-pyloric tube and also NGT to suction to prevent aspiration.   - Continue Reglan.   14. Mobilize  as much as possible with PT.  Limit sedation.   CRITICAL CARE Performed by: Loralie Champagne  Total critical care time: 45 minutes  Critical care time was exclusive of separately billable procedures and treating other patients.  Critical care was necessary to treat or prevent  imminent or life-threatening deterioration.  Critical care was time spent personally by me on the following activities: development of treatment plan with patient and/or surrogate as well as nursing, discussions with consultants, evaluation of patient's response to treatment, examination of patient, obtaining history from patient or surrogate, ordering and performing treatments and interventions, ordering and review of laboratory studies, ordering and review of radiographic studies, pulse oximetry and re-evaluation of patient's condition.  Loralie Champagne MD 06/12/2021 7:42 AM

## 2021-06-12 NOTE — Progress Notes (Signed)
Patient ID: TREMELL REIMERS, male   DOB: 27-Sep-1950, 71 y.o.   MRN: [id number] Extracorporeal support note  ECLS support day: 21 Indication: ARDS  Configuration: VV  Drainage cannula: Crescent R IJ Return cannula: Crescent R IJ  Pump speed: 3300 rpm Pump flow: 4.4 L/min Pump used: Cardiohelp  Sweep gas: 5   Circuit check: Good color change. LDH stable 202.  Anticoagulant: Bivalirudin, PTT goal 50-80. PTT 65  Changes in support: No changes today.   Anticipated goals/duration of support: Wean to discontinuation  Loralie Champagne, MD  7:57 AM

## 2021-06-13 ENCOUNTER — Inpatient Hospital Stay (HOSPITAL_COMMUNITY): Payer: PPO

## 2021-06-13 DIAGNOSIS — J9602 Acute respiratory failure with hypercapnia: Secondary | ICD-10-CM | POA: Diagnosis not present

## 2021-06-13 DIAGNOSIS — I249 Acute ischemic heart disease, unspecified: Secondary | ICD-10-CM | POA: Diagnosis not present

## 2021-06-13 DIAGNOSIS — J9601 Acute respiratory failure with hypoxia: Secondary | ICD-10-CM | POA: Diagnosis not present

## 2021-06-13 LAB — CBC
HCT: 37.1 % — ABNORMAL LOW (ref 39.0–52.0)
HCT: 35.9 % — ABNORMAL LOW (ref 39.0–52.0)
HCT: 32 % — ABNORMAL LOW (ref 39.0–52.0)
Hemoglobin: 11.9 g/dL — ABNORMAL LOW (ref 13.0–17.0)
Hemoglobin: 11.5 g/dL — ABNORMAL LOW (ref 13.0–17.0)
Hemoglobin: 10.3 g/dL — ABNORMAL LOW (ref 13.0–17.0)
MCH: 30.2 pg (ref 26.0–34.0)
MCH: 30.3 pg (ref 26.0–34.0)
MCH: 30.3 pg (ref 26.0–34.0)
MCHC: 32.1 g/dL (ref 30.0–36.0)
MCHC: 32 g/dL (ref 30.0–36.0)
MCHC: 32.2 g/dL (ref 30.0–36.0)
MCV: 94.2 fL (ref 80.0–100.0)
MCV: 94.5 fL (ref 80.0–100.0)
MCV: 94.1 fL (ref 80.0–100.0)
Platelets: 90 10*3/uL — ABNORMAL LOW (ref 150–400)
Platelets: 86 10*3/uL — ABNORMAL LOW (ref 150–400)
Platelets: 73 10*3/uL — ABNORMAL LOW (ref 150–400)
RBC: 3.94 MIL/uL — ABNORMAL LOW (ref 4.22–5.81)
RBC: 3.8 MIL/uL — ABNORMAL LOW (ref 4.22–5.81)
RBC: 3.4 MIL/uL — ABNORMAL LOW (ref 4.22–5.81)
RDW: 18.3 % — ABNORMAL HIGH (ref 11.5–15.5)
RDW: 17.9 % — ABNORMAL HIGH (ref 11.5–15.5)
RDW: 17.6 % — ABNORMAL HIGH (ref 11.5–15.5)
WBC: 10.9 10*3/uL — ABNORMAL HIGH (ref 4.0–10.5)
WBC: 12 10*3/uL — ABNORMAL HIGH (ref 4.0–10.5)
WBC: 9.1 10*3/uL (ref 4.0–10.5)
nRBC: 0 % (ref 0.0–0.2)
nRBC: 0 % (ref 0.0–0.2)
nRBC: 0 % (ref 0.0–0.2)

## 2021-06-13 LAB — BASIC METABOLIC PANEL
Anion gap: 9 (ref 5–15)
Anion gap: 10 (ref 5–15)
BUN: 44 mg/dL — ABNORMAL HIGH (ref 8–23)
BUN: 45 mg/dL — ABNORMAL HIGH (ref 8–23)
CO2: 32 mmol/L (ref 22–32)
CO2: 31 mmol/L (ref 22–32)
Calcium: 8.4 mg/dL — ABNORMAL LOW (ref 8.9–10.3)
Calcium: 8.4 mg/dL — ABNORMAL LOW (ref 8.9–10.3)
Chloride: 101 mmol/L (ref 98–111)
Chloride: 102 mmol/L (ref 98–111)
Creatinine, Ser: 0.94 mg/dL (ref 0.61–1.24)
Creatinine, Ser: 0.85 mg/dL (ref 0.61–1.24)
GFR, Estimated: >60 mL/min (ref >60)
GFR, Estimated: >60 mL/min (ref >60)
Glucose, Bld: 144 mg/dL — ABNORMAL HIGH (ref 70–99)
Glucose, Bld: 169 mg/dL — ABNORMAL HIGH (ref 70–99)
Potassium: 3.8 mmol/L (ref 3.5–5.1)
Potassium: 4 mmol/L (ref 3.5–5.1)
Sodium: 142 mmol/L (ref 135–145)
Sodium: 143 mmol/L (ref 135–145)

## 2021-06-13 LAB — GLUCOSE, CAPILLARY
Glucose-Capillary: 135 mg/dL — ABNORMAL HIGH (ref 70–99)
Glucose-Capillary: 138 mg/dL — ABNORMAL HIGH (ref 70–99)
Glucose-Capillary: 145 mg/dL — ABNORMAL HIGH (ref 70–99)
Glucose-Capillary: 171 mg/dL — ABNORMAL HIGH (ref 70–99)
Glucose-Capillary: 131 mg/dL — ABNORMAL HIGH (ref 70–99)
Glucose-Capillary: 124 mg/dL — ABNORMAL HIGH (ref 70–99)

## 2021-06-13 LAB — TYPE AND SCREEN
ABO/RH(D): O POS
Antibody Screen: NEGATIVE
Unit division: 0
Unit division: 0
Unit division: 0
Unit division: 0
Unit division: 0

## 2021-06-13 LAB — POCT I-STAT 7, (LYTES, BLD GAS, ICA,H+H)
Acid-Base Excess: 9 mmol/L — ABNORMAL HIGH (ref 0.0–2.0)
Acid-Base Excess: 8 mmol/L — ABNORMAL HIGH (ref 0.0–2.0)
Acid-Base Excess: 5 mmol/L — ABNORMAL HIGH (ref 0.0–2.0)
Acid-Base Excess: 8 mmol/L — ABNORMAL HIGH (ref 0.0–2.0)
Acid-Base Excess: 8 mmol/L — ABNORMAL HIGH (ref 0.0–2.0)
Bicarbonate: 34.9 mmol/L — ABNORMAL HIGH (ref 20.0–28.0)
Bicarbonate: 34.2 mmol/L — ABNORMAL HIGH (ref 20.0–28.0)
Bicarbonate: 32 mmol/L — ABNORMAL HIGH (ref 20.0–28.0)
Bicarbonate: 33.3 mmol/L — ABNORMAL HIGH (ref 20.0–28.0)
Bicarbonate: 32.5 mmol/L — ABNORMAL HIGH (ref 20.0–28.0)
Calcium, Ion: 1.19 mmol/L (ref 1.15–1.40)
Calcium, Ion: 1.2 mmol/L (ref 1.15–1.40)
Calcium, Ion: 1.23 mmol/L (ref 1.15–1.40)
Calcium, Ion: 1.21 mmol/L (ref 1.15–1.40)
Calcium, Ion: 1.18 mmol/L (ref 1.15–1.40)
HCT: 35 % — ABNORMAL LOW (ref 39.0–52.0)
HCT: 38 % — ABNORMAL LOW (ref 39.0–52.0)
HCT: 36 % — ABNORMAL LOW (ref 39.0–52.0)
HCT: 33 % — ABNORMAL LOW (ref 39.0–52.0)
HCT: 30 % — ABNORMAL LOW (ref 39.0–52.0)
Hemoglobin: 11.9 g/dL — ABNORMAL LOW (ref 13.0–17.0)
Hemoglobin: 12.9 g/dL — ABNORMAL LOW (ref 13.0–17.0)
Hemoglobin: 12.2 g/dL — ABNORMAL LOW (ref 13.0–17.0)
Hemoglobin: 11.2 g/dL — ABNORMAL LOW (ref 13.0–17.0)
Hemoglobin: 10.2 g/dL — ABNORMAL LOW (ref 13.0–17.0)
O2 Saturation: 90 %
O2 Saturation: 85 %
O2 Saturation: 91 %
O2 Saturation: 94 %
O2 Saturation: 94 %
Patient temperature: 37
Patient temperature: 37
Patient temperature: 37
Patient temperature: 36.8
Patient temperature: 36.1
Potassium: 3.9 mmol/L (ref 3.5–5.1)
Potassium: 4.2 mmol/L (ref 3.5–5.1)
Potassium: 4.6 mmol/L (ref 3.5–5.1)
Potassium: 3.9 mmol/L (ref 3.5–5.1)
Potassium: 4.1 mmol/L (ref 3.5–5.1)
Sodium: 144 mmol/L (ref 135–145)
Sodium: 143 mmol/L (ref 135–145)
Sodium: 141 mmol/L (ref 135–145)
Sodium: 142 mmol/L (ref 135–145)
Sodium: 145 mmol/L (ref 135–145)
TCO2: 36 mmol/L — ABNORMAL HIGH (ref 22–32)
TCO2: 36 mmol/L — ABNORMAL HIGH (ref 22–32)
TCO2: 34 mmol/L — ABNORMAL HIGH (ref 22–32)
TCO2: 35 mmol/L — ABNORMAL HIGH (ref 22–32)
TCO2: 34 mmol/L — ABNORMAL HIGH (ref 22–32)
pCO2 arterial: 53 mmHg — ABNORMAL HIGH (ref 32–48)
pCO2 arterial: 54.8 mmHg — ABNORMAL HIGH (ref 32–48)
pCO2 arterial: 58.3 mmHg — ABNORMAL HIGH (ref 32–48)
pCO2 arterial: 50.7 mmHg — ABNORMAL HIGH (ref 32–48)
pCO2 arterial: 42.1 mmHg (ref 32–48)
pH, Arterial: 7.426 (ref 7.35–7.45)
pH, Arterial: 7.404 (ref 7.35–7.45)
pH, Arterial: 7.348 — ABNORMAL LOW (ref 7.35–7.45)
pH, Arterial: 7.425 (ref 7.35–7.45)
pH, Arterial: 7.492 — ABNORMAL HIGH (ref 7.35–7.45)
pO2, Arterial: 59 mmHg — ABNORMAL LOW (ref 83–108)
pO2, Arterial: 52 mmHg — ABNORMAL LOW (ref 83–108)
pO2, Arterial: 65 mmHg — ABNORMAL LOW (ref 83–108)
pO2, Arterial: 68 mmHg — ABNORMAL LOW (ref 83–108)
pO2, Arterial: 62 mmHg — ABNORMAL LOW (ref 83–108)

## 2021-06-13 LAB — BPAM RBC
Blood Product Expiration Date: 202303132359
Blood Product Expiration Date: 202303132359
Blood Product Expiration Date: 202303132359
Blood Product Expiration Date: 202303182359
Blood Product Expiration Date: 202303242359
ISSUE DATE / TIME: 202302231010
ISSUE DATE / TIME: 202302241133
Unit Type and Rh: 5100
Unit Type and Rh: 5100
Unit Type and Rh: 5100
Unit Type and Rh: 5100
Unit Type and Rh: 5100

## 2021-06-13 LAB — HEPATIC FUNCTION PANEL
ALT: 50 U/L — ABNORMAL HIGH (ref 0–44)
AST: 24 U/L (ref 15–41)
Albumin: 3.1 g/dL — ABNORMAL LOW (ref 3.5–5.0)
Alkaline Phosphatase: 86 U/L (ref 38–126)
Bilirubin, Direct: 0.2 mg/dL (ref 0.0–0.2)
Indirect Bilirubin: 0.6 mg/dL (ref 0.3–0.9)
Total Bilirubin: 0.8 mg/dL (ref 0.3–1.2)
Total Protein: 5 g/dL — ABNORMAL LOW (ref 6.5–8.1)

## 2021-06-13 LAB — APTT
aPTT: 61 seconds — ABNORMAL HIGH (ref 24–36)
aPTT: 58 seconds — ABNORMAL HIGH (ref 24–36)

## 2021-06-13 LAB — FIBRINOGEN: Fibrinogen: 519 mg/dL — ABNORMAL HIGH (ref 210–475)

## 2021-06-13 LAB — LACTIC ACID, PLASMA: Lactic Acid, Venous: 1.2 mmol/L (ref 0.5–1.9)

## 2021-06-13 LAB — MAGNESIUM: Magnesium: 2.4 mg/dL (ref 1.7–2.4)

## 2021-06-13 LAB — LACTATE DEHYDROGENASE: LDH: 219 U/L — ABNORMAL HIGH (ref 98–192)

## 2021-06-13 MED ORDER — FENTANYL 2500MCG IN NS 250ML (10MCG/ML) PREMIX INFUSION
0.0000 ug/h | INTRAVENOUS | Status: DC
  Administered 2021-06-13: 25 ug/h via INTRAVENOUS
  Administered 2021-06-15: 75 ug/h via INTRAVENOUS
  Administered 2021-06-16: 125 ug/h via INTRAVENOUS
  Filled 2021-06-13 (×4): qty 250

## 2021-06-13 MED ORDER — VANCOMYCIN HCL 1500 MG/300ML IV SOLN
1500.0000 mg | INTRAVENOUS | Status: AC
  Administered 2021-06-13: 1500 mg via INTRAVENOUS
  Filled 2021-06-13: qty 300

## 2021-06-13 MED ORDER — SODIUM CHLORIDE 0.9 % IV SOLN
2.0000 g | INTRAVENOUS | Status: AC
  Administered 2021-06-13: 2 g via INTRAVENOUS
  Filled 2021-06-13: qty 2

## 2021-06-13 MED ORDER — CLONAZEPAM 0.5 MG PO TBDP
1.0000 mg | ORAL_TABLET | Freq: Three times a day (TID) | ORAL | Status: DC
  Administered 2021-06-13 – 2021-06-16 (×11): 1 mg
  Filled 2021-06-13 (×12): qty 2

## 2021-06-13 MED ORDER — ALTEPLASE 2 MG IJ SOLR
2.0000 mg | Freq: Once | INTRAMUSCULAR | Status: AC
  Administered 2021-06-13: 2 mg
  Filled 2021-06-13: qty 2

## 2021-06-13 NOTE — Progress Notes (Signed)
NAME:  Randall Hodges, MRN:  [id number], DOB:  10/09/1950, LOS: 63 ADMISSION DATE:  05/11/2021, CONSULTATION DATE: 05/17/2021 REFERRING MD:  Dahlia Byes, MD, CHIEF COMPLAINT: Increasing shortness of breath  History of Present Illness:  71 year old male with coronary artery disease who initially presented with chest pain, noted to have multivessel coronary artery disease, he underwent CABG x3 on 04/1818 23, course was complicated with atrial fibrillation, frequent hiccups and aspiration pneumonia leading to hypoxia and increasing oxygen requirement. PCCM was consulted for evaluation and help with management  Patient stated hiccups are better but continued complain of shortness of breath, cough unable to bring up phlegm.  Pertinent  Medical History  CAD Allergic rhinitis Prostate cancer GERD Pulmonary fibrosis - mild PFT normal.   Significant Hospital Events: Including procedures, antibiotic start and stop dates in addition to other pertinent events   1/18 CABG 1/30 PCCM consult, Worsening O2 needs, likely aspiration, new pressor need 2/2 intubation, placed on VV ECMO 2/5 issues with recirc, bronch neg, improved with supine positioning 2/7 percutaneous tracheostomy 2/10 diuresis and weaning sedation. CT chest shows diffuse ground-glass opacification.  2/11 weaning sedation 2/13 increasing air hunger as oxygenator efficiency decreasing.  2/14 oxygenator changed.  Still having air hunger but able to follow commands and participate with therapy.  2/15 considerable distress with double stacking. Switched to true rest settings - with improvement in distress.  2/16 bronch, NGT placed to LIWS, con't post-pyloric TF 2/17 DCCV overnight for Afib with RVR. Stood at bedside with PT.  Interim History / Subjective:  Subcutaneous air developed after attempt at recruitment. Seems to have improved spontaneously overnight with reduction in PEEP. Sweep requirement unchanged.   Objective   Blood  pressure (!) 160/62, pulse 86, temperature 98.6 F (37 C), temperature source Core, resp. rate (!) 8, height 5\' 10"  (1.778 m), weight 80.3 kg, SpO2 95 %. CVP:  [0 mmHg-14 mmHg] 5 mmHg  Vent Mode: PCV FiO2 (%):  [30 %] 30 % Set Rate:  [10 bmp] 10 bmp PEEP:  [10 cmH20] 10 cmH20 Plateau Pressure:  [11 cmH20-20 cmH20] 11 cmH20   Intake/Output Summary (Last 24 hours) at 06/13/2021 1330 Last data filed at 06/13/2021 1300 Gross per 24 hour  Intake 2942.68 ml  Output 4945 ml  Net -2002.32 ml    Filed Weights   06/11/21 0500 06/12/21 0450 06/13/21 0412  Weight: 82.4 kg 80.3 kg 80.3 kg   Examination: Increased respiratory effort since decreasing sedation  Lungs with poor air movement, triggering vent, on PCV 10/10 tidal volumes up to 150 mL with increased WOB Ext warm, no ectopy on monitor at present ECMO circuit with minimal clot, good color change Moves all 4 ext to command, generally weak  Ancillary tests personally reviewed:   Chest x-ray shows persistent bilateral interstitial opacifications Mild persistent hypercarbia with normal pH on 6.5 sweep. Did not tolerate sweep wean .  Assessment & Plan:   Acute hypoxic and hypercapnic respiratory failure with hypoxia ARDS Aspiration pneumonitis- recurrent, now with NGT to suction, on reglan, getting postpyloric feeds Hiccups, recurrent aspiration episodes.  ILD, mild- question of chronic aspiration syndrome Agitation, ICU delirium, and air hunger- improved today CAD with UA s/p CABG x 3, (LIMA-LAD, SVG-OM, SVG-left PDA) Afib with RVR, DCCV, NSVT, ectopy- on amio gtt Hypertension Possible GPC bacteremia vs contaminant- staph epi in 2/4 bottles- s/p 7 days vanc  Plan:  - Lung protective TV - Sweep wean to pH > 7.35 and/or WOB - NG to LIS, cortrak  for PP TF, reglan - VV ECMO and diuresis management per CHF team, diamox and reduce lasix gtt today -con't baclofen, morphine, seroquel, klonipin, melatonin and gabapentin; fent drip weaned  off today - colchicine per TCTS - bowel regimen as ordered, rectal management system in place, stool bulkener to be ordered by PharmD - Continue PT/OT efforts - PPI BID - Finished trial of steroids, did not seem to have effect - Aspirin, statin - Holding coreg due to bradycardia - Continue IV amiodarone for frequent ectopy and questionable GI absorption - Hydralazine PRN; can add scheduled if needed  Continue current aggressive support until 2/27 and get a repeat CT that day.  If no improvement in GGO or there are fibrotic changes, we will need to consider setting a deadline for continued ECMO support due to futility.  Discussed with CHF/TCTS teams and family.  Best Practice (right click and "Reselect all SmartList Selections" daily)   Diet/type: TF DVT prophylaxis: Bival CBG: SSI GI prophylaxis: PPI Lines: L [place] TLC, RIJ  Foley:  Yes Code Status:  full code Last date of multidisciplinary goals of care discussion [2/16]   CRITICAL CARE Performed by: Kipp Brood  Total critical care time: 40 minutes  Critical care time was exclusive of separately billable procedures and treating other patients.  Critical care was necessary to treat or prevent imminent or life-threatening deterioration.  Critical care was time spent personally by me on the following activities: development of treatment plan with patient and/or surrogate as well as nursing, discussions with consultants, evaluation of patient's response to treatment, examination of patient, obtaining history from patient or surrogate, ordering and performing treatments and interventions, ordering and review of laboratory studies, ordering and review of radiographic studies, pulse oximetry, re-evaluation of patient's condition and participation in multidisciplinary rounds.  Kipp Brood, MD [facility] ICU Physician [place]  Pager: [phone] Mobile: [phone] After hours: [phone].  Prefer epic messenger  for cross cover needs If after hours, please call E-link

## 2021-06-13 NOTE — Progress Notes (Addendum)
Pharmacy Antibiotic Note  Randall Hodges is a 71 y.o. male admitted on 05/16/2021. Continues on ECMO and now r/o PNA/purulent trach secreations.  Pharmacy has been consulted for Cefepime and Vancomycin dosing. New cx sent. Afeb. WBC 9.1.  Plan: Cefepime 2gm IV q8h Vancomycin 1500 mg now then 1754m IV q24h (pt with therapeutic trough on this dose before) Will f/u renal function, micro data, and pt's clinical condition Vanc levels prn  Height: _0  (177.8 cm) Weight: 80.3 kg (177 lb 0.5 oz) IBW/kg (Calculated) : 73  Temp (24hrs), Avg:98.4 F (36.9 C), Min:98.2 F (36.8 C), Max:98.6 F (37 C)  Recent Labs  Lab 06/08/21 0754 06/08/21 1601 06/09/21 0407 06/09/21 1617 06/10/21 0233 06/10/21 1643 06/11/21 0402 06/11/21 1644 06/12/21 0314 06/12/21 1700 06/13/21 0220 06/13/21 1620 06/13/21 2021  WBC  --    < > 9.3   < > 11.8*   < > 11.3* 14.6* 11.1* 10.3 10.9* 12.0* 9.1  CREATININE  --    < > 1.10   < > 1.01   < > 0.96 0.99 0.93 0.88 0.94 0.85  --   LATICACIDVEN  --   --  1.4  --  1.2  --  1.1  --  1.0  --  1.2  --   --   VANCOTROUGH 19  --   --   --   --   --   --   --   --   --   --   --   --    < > = values in this interval not displayed.    Estimated Creatinine Clearance: 82.3 mL/min (by C-G formula based on SCr of 0.85 mg/dL).    Allergies  Allergen Reactions   Amoxicillin-Pot Clavulanate     Per pt report on 08/19/20, makes his urine dark colored   Atorvastatin Other (See Comments)     ( pt states causing runny nose, headaches, issue w/ urination)   Plant Derived Enzymes     Other reaction(s): Unknown   Trichophyton Other (See Comments)    Antimicrobials this admission: Cefepime 1/29 >> 1/30, restart 2/25>> Zosyn 1/30 >>2/2 Meropenem 2/2> 2/9 Vanc 1/31 >> 2/5, 2/15>2/22; restart 2/25>>   2/25 Trach asp: 2/25 BCx: 2/16 BAL: normal flora 2/14 TA: GPC >NF 2/14 bld - GPC 1/2> MRSE  2/2 BAL: ng, PJP negative 2/2 resp panel: neg 1/31 ESR 55 2/1 covid-  negative 2/1 MRSA PCR- negative 2/1 flu negative  Thank you for allowing pharmacy to be a part of this patients care.  CFranky Macho2/25/2023 8:54 PM

## 2021-06-13 NOTE — Progress Notes (Signed)
°   06/13/21 0802  Tracheostomy Shiley XLT Distal 6 mm Cuffed;Distal  Placement Date/Time: 06/02/21 1530   Placed By: (c) ICU physician  Brand: Shiley XLT Distal  Size (mm): 6 mm  Style: Cuffed;Distal  Status Secured  Site Assessment Clean;Dry  Site Care Dressing applied  Inner Cannula Care Changed/new  Ties Assessment Clean, Dry, Secure  Cuff Pressure (cm H2O) Clear OR 27-39 CmH2O  Tracheostomy Equipment at bedside Yes and checklist posted at head of bed  Adult Ventilator Settings  Vent Type Servo i  Humidity HME  Vent Mode PCV  Set Rate 10 bmp  FiO2 (%) 30 %  I Time 0.9 Sec(s)  Pressure Control 10 cmH20  PEEP 10 cmH20  Adult Ventilator Measurements  Peak Airway Pressure 24 L/min  Mean Airway Pressure 20 cmH20  Plateau Pressure 11 cmH20  Resp Rate Spontaneous 41 br/min  Resp Rate Total 51 br/min  Exhaled Vt 221 mL  Measured Ve 8.8 mL  I:E Ratio Measured 1:1  Auto PEEP 5 cmH20  Total PEEP 10 cmH20  SpO2 90 %  Adult Ventilator Alarms  Alarms On Y  Ve High Alarm 5 L/min  Ve Low Alarm 0.5 L/min  Resp Rate High Alarm 35 br/min  Resp Rate Low Alarm 8  PEEP Low Alarm 8 cmH2O  Press High Alarm 45 cmH2O  T Apnea 20 sec(s)  Daily Weaning Assessment  Daily Assessment of Readiness to Wean Wean protocol criteria not met  Reason not met PEEP > 8  Breath Sounds  Bilateral Breath Sounds Diminished  Airway Suctioning/Secretions  Suction Type Tracheal  Suction Device  Catheter  Secretion Amount Copious  Secretion Color Pink tinged;Tan  Secretion Consistency Thick  Suction Tolerance Tolerated well  Suctioning Adverse Effects None

## 2021-06-13 NOTE — Progress Notes (Signed)
ANTICOAGULATION CONSULT NOTE  Pharmacy Consult for Bivalirudin Indication:  ECMO  Allergies  Allergen Reactions   Amoxicillin-Pot Clavulanate     Per pt report on 08/19/20, makes his urine dark colored   Atorvastatin Other (See Comments)     ( pt states causing runny nose, headaches, issue w/ urination)   Plant Derived Enzymes     Other reaction(s): Unknown   Trichophyton Other (See Comments)    Patient Measurements: Height: 5\' 10"  (177.8 cm) Weight: 80.3 kg (177 lb 0.5 oz) IBW/kg (Calculated) : 73  Vital Signs: Temp: 98.2 F (36.8 C) (02/25 1600) Temp Source: Core (02/25 1200) BP: 151/127 (02/25 1715) Pulse Rate: 90 (02/25 1715)  Labs: Recent Labs    06/11/21 0402 06/11/21 0406 06/12/21 1700 06/12/21 1927 06/13/21 0220 06/13/21 0311 06/13/21 1149 06/13/21 1620 06/13/21 1628  HGB 8.4*   < > 11.0*   < > 11.9*   < > 12.2* 11.5* 11.2*  HCT 28.2*   < > 34.3*   < > 37.1*   < > 36.0* 35.9* 33.0*  PLT 90*   < > 82*  --  90*  --   --  86*  --   APTT 65*   < > 70*  --  61*  --   --  58*  --   LABPROT 24.5*  --   --   --   --   --   --   --   --   INR 2.2*  --   --   --   --   --   --   --   --   CREATININE 0.96   < > 0.88  --  0.94  --   --  0.85  --    < > = values in this interval not displayed.     Estimated Creatinine Clearance: 82.3 mL/min (by C-G formula based on SCr of 0.85 mg/dL).  Assessment: 71 year old male with coronary artery disease who initially presented with chest pain, noted to have multivessel coronary artery disease. He underwent CABG x3 on 05/16/2021; course was complicated with atrial fibrillation, frequent hiccups and aspiration pneumonia leading to hypoxia and increasing oxygen requirement. Patient now requiring VV ECMO. Pharmacy consulted for bivalirudin IV.  aPTT therapeutic at 58 sec on bivalirudin drip 0.075mg /kg/hr; Hgb 11.2, pltc 86;  LDH stable 200s, fibrinogen up 519. NGT suction bilious with some red tint - MDs aware.  Goal of Therapy:   aPTT 50-80 seconds Monitor platelets by anticoagulation protocol: Yes   Plan:  Continue bivalirudin at 0.075 mg/kg/hr Monitor q12h aptt and CBC Monitor closely for s/sx bleeding/thrombus  Sherlon Handing, PharmD, BCPS Please see amion for complete clinical pharmacist phone list  06/13/2021 5:29 PM

## 2021-06-13 NOTE — Progress Notes (Signed)
Patient ID: Randall Hodges, male   DOB: 1950-12-26, 71 y.o.   MRN: [id number]    Progress Note  Patient Name: Randall Hodges Date of Encounter: 06/13/2021  [facility] Cardiologist: [name], MD   Subjective   2/2: VV ECMO initiation, Crescent catheter right IJ 2/7: Tracheostomy 2/13 CT C/A/P: Diffuse ground-glass opacities consistent with atelectasis and edema presumably related to ECMO physiology and respiratory failure.  Underlying moderate interstitial lung disease mildly progressed from prior CT. 2/15: Oxygenator changed, trach changed.  2/16: Transient asystole with cough and adjustment of tracheostomy overnight.  2/17: Atrial flutter with RVR, required emergent DCCV and amiodarone restarted 2/23: Pneumomediastinum with high PEEP, PEEP decreased  CXR with severe bilateral diffuse disease, concern for developing honeycombing/fibrosis in LUL   Remains on vent  FiO2 0.3. TVs 100-150. Requiring frequent sedation for agitation.   Having high bilious output from NGT  Remains on lasix gtt. Weight back down to baseline. Was getting some albumin for chugging CVP 7-8  NSR this morning on amiodarone gtt. No further VT   ECMO: Speed 3600 rpm Flow 4.8 L/min pVen -101 DeltaP 31 Sweep 6 PTT 61 (goal 50-80) LDH 219 ABG 7.34/58/65/91% Lactate 1.0  Cardiac Studies: Echo (limited, 1/30): Echo reviewed, there is clear respirophasic variation of the interventricular septum and marked respirophasic variation of E inflow velocity on doppler evaluation of the mitral valve.  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  LV EF 45-50%.   RHC Procedural Findings (on norepinephrine 6): Hemodynamics (mmHg) RA mean 12 RV 37/12 PA 38/16, mean 27 PCWP mean 11 LV 108/12 AO 96/55 PAPI 1.8 Oxygen  saturations: PA 54% AO 94% Cardiac Output (Fick) 5.59  Cardiac Index (Fick) 2.81  PVR 2.8 WU Simultaneous RV/LV tracings were obtained.  Difficult to interpret due to atrial fibrillation.  There was some suggestion of discordance (ventricular interdependence) but not clear.  Inpatient Medications    Scheduled Meds:  aspirin  81 mg Per Tube Daily   baclofen  5 mg Per Tube TID   chlorhexidine gluconate (MEDLINE KIT)  15 mL Mouth Rinse BID   Chlorhexidine Gluconate Cloth  6 each Topical Daily   clonazePAM  1 mg Per Tube TID   colchicine  0.6 mg Per Tube Daily   feeding supplement (PROSource TF)  90 mL Per Tube BID   fiber  1 packet Per Tube TID   free water  200 mL Per Tube Q4H   Gerhardt's butt cream   Topical TID   haloperidol lactate  5 mg Intravenous Once   insulin aspart  0-15 Units Subcutaneous Q4H   mouth rinse  15 mL Mouth Rinse 10 times per day   melatonin  5 mg Per Tube QHS   metoCLOPramide (REGLAN) injection  5 mg Intravenous Q8H   morphine  15 mg Per Tube Q6H   pantoprazole (PROTONIX) IV  40 mg Intravenous Q12H   potassium chloride  40 mEq Per Tube BID   QUEtiapine  25 mg Per Tube QHS   rosuvastatin  10 mg Per Tube Daily   sodium chloride flush  10-40 mL Intracatheter Q12H   sodium chloride flush  10-40 mL Intracatheter Q12H   sodium chloride flush  3 mL Intravenous Q12H   Continuous Infusions:  sodium chloride Stopped (06/01/21 1121)   sodium chloride Stopped (06/10/21 1705)   sodium chloride 10 mL/hr  at 06/11/21 1816   albumin human 12.5 g (06/10/21 2101)   albumin human 12.5 g (06/13/21 0811)   amiodarone 30 mg/hr (06/13/21 1400)   bivalirudin (ANGIOMAX) infusion 0.5 mg/mL (Non-ACS indications) 0.075 mg/kg/hr (06/13/21 1400)   feeding supplement (VITAL 1.5 CAL) 60 mL/hr at 06/13/21 0600   fentaNYL infusion INTRAVENOUS Stopped (06/13/21 1359)   furosemide (LASIX) 200 mg in dextrose 5% 100 mL (38m/mL) infusion 2 mg/hr (06/13/21 1400)   PRN  Meds: Place/Maintain arterial line **AND** sodium chloride, sodium chloride, sodium chloride, acetaminophen (TYLENOL) oral liquid 160 mg/5 mL, albumin human, albumin human, diphenhydrAMINE, docusate, fentaNYL (SUBLIMAZE) injection, fentaNYL (SUBLIMAZE) injection, guaiFENesin, hydrALAZINE, levalbuterol, lip balm, ondansetron (ZOFRAN) IV, polyvinyl alcohol, sennosides, sodium chloride flush, white petrolatum   Vital Signs    Vitals:   06/13/21 1300 06/13/21 1315 06/13/21 1330 06/13/21 1345  BP: (!) 166/79 (!) 160/62 (!) 192/84 (!) 145/61  Pulse: 90 86 85 86  Resp: (!) 0 (!) 8 (!) 0 12  Temp:      TempSrc:      SpO2: 96% 95% 94% 96%  Weight:      Height:        Intake/Output Summary (Last 24 hours) at 06/13/2021 1423 Last data filed at 06/13/2021 1400 Gross per 24 hour  Intake 2752.45 ml  Output 4770 ml  Net -2017.55 ml    Last 3 Weights 06/13/2021 06/12/2021 06/11/2021  Weight (lbs) 177 lb 0.5 oz 177 lb 0.5 oz 181 lb 10.5 oz  Weight (kg) 80.3 kg 80.3 kg 82.4 kg      Telemetry    NSR 80-90s personally reviewed.   Physical Exam   General:  Weak appearing.sedated on vent HEENT: normal Neck: supple. +RIJ Crescent + trach Carotids 2+ bilat; no bruits. No lymphadenopathy or thryomegaly appreciated. Cor: PMI nondisplaced. Regular rate & rhythm. No rubs, gallops or murmurs. Lungs: coarse Abdomen: soft, nontender, nondistended. No hepatosplenomegaly. No bruits or masses. Good bowel sounds. Extremities: no cyanosis, clubbing, rash, 2-3+ dependent edema Neuro:sedated on vent   Labs    High Sensitivity Troponin:   Recent Labs  Lab 06/05/21 2208 06/06/21 0037 06/06/21 0532  TROPONINIHS 211* 226* 196*       Chemistry Recent Labs  Lab 06/08/21 0629502/20/23 0804 06/10/21 0233 06/10/21 0455 06/11/21 0402 06/11/21 0406 06/12/21 0314 06/12/21 0315 06/12/21 1700 06/12/21 1927 06/13/21 0220 06/13/21 0311 06/13/21 0737 06/13/21 1149  NA  --    < > 148*   < > 146*   < >  146*   < > 144   < > 142 144 143 141  K  --    < > 4.1   < > 4.3   < > 4.1   < > 3.7   < > 3.8 3.9 4.2 4.6  CL  --    < > 103   < > 103   < > 104  --  103  --  101  --   --   --   CO2  --    < > 36*   < > 36*   < > 35*  --  33*  --  32  --   --   --   GLUCOSE  --    < > 135*   < > 145*   < > 124*  --  133*  --  144*  --   --   --   BUN  --    < > 50*   < >  48*   < > 47*  --  46*  --  44*  --   --   --   CREATININE  --    < > 1.01   < > 0.96   < > 0.93  --  0.88  --  0.94  --   --   --   CALCIUM  --    < > 8.1*   < > 8.3*   < > 8.4*  --  8.5*  --  8.4*  --   --   --   MG 2.1  --  2.0  --  2.2  --   --   --   --   --   --   --   --   --   PROT  --    < > 4.8*  --  4.8*  --  4.8*  --   --   --  5.0*  --   --   --   ALBUMIN  --    < > 2.9*  --  3.0*  --  2.8*  --   --   --  3.1*  --   --   --   AST  --    < > 31  --  26  --  22  --   --   --  24  --   --   --   ALT  --    < > 70*  --  60*  --  54*  --   --   --  50*  --   --   --   ALKPHOS  --    < > 78  --  77  --  78  --   --   --  86  --   --   --   BILITOT  --    < > 0.5  --  0.6  --  0.5  --   --   --  0.8  --   --   --   GFRNONAA  --    < > >60   < > >60   < > >60  --  >60  --  >60  --   --   --   ANIONGAP  --    < > 9   < > 7   < > 7  --  8  --  9  --   --   --    < > = values in this interval not displayed.     Lipids  No results for input(s): CHOL, TRIG, HDL, LABVLDL, LDLCALC, CHOLHDL in the last 168 hours.   Hematology Recent Labs  Lab 06/12/21 0314 06/12/21 0315 06/12/21 1700 06/12/21 1927 06/13/21 0220 06/13/21 0311 06/13/21 0737 06/13/21 1149  WBC 11.1*  --  10.3  --  10.9*  --   --   --   RBC 3.33*  --  3.63*  --  3.94*  --   --   --   HGB 9.9*   < > 11.0*   < > 11.9* 11.9* 12.9* 12.2*  HCT 32.2*   < > 34.3*   < > 37.1* 35.0* 38.0* 36.0*  MCV 96.7  --  94.5  --  94.2  --   --   --   MCH 29.7  --  30.3  --  30.2  --   --   --   MCHC  30.7  --  32.1  --  32.1  --   --   --   RDW 18.0*  --  18.2*  --  18.3*  --   --    --   PLT 86*  --  82*  --  90*  --   --   --    < > = values in this interval not displayed.    Thyroid No results for input(s): TSH, FREET4 in the last 168 hours.  BNPNo results for input(s): BNP, PROBNP in the last 168 hours.  DDimer No results for input(s): DDIMER in the last 168 hours.   Radiology    DG CHEST PORT 1 VIEW  Result Date: 06/13/2021 CLINICAL DATA:  Chest pain.  Follow-up exam. EXAM: PORTABLE CHEST 1 VIEW COMPARISON:  06/12/2021 and older studies. FINDINGS: Extensive bilateral airspace lung opacities are without change. No convincing pneumothorax.  No mediastinal widening. Tracheostomy tube, enteric tube, nasal/orogastric tube and right PICC are stable. Posterior right second rib fracture again noted. Stable neck base subcutaneous emphysema. IMPRESSION: 1. No significant change from the previous day's study. 2. Persistent and extensive bilateral airspace lung opacities with suspected associated pleural effusions. 3. No convincing pneumothorax. 4. Stable support apparatus. Electronically Signed   By: Lajean Manes M.D.   On: 06/13/2021 08:35   DG CHEST PORT 1 VIEW  Result Date: 06/12/2021 CLINICAL DATA:  ECMO cannula, tracheostomy tube EXAM: PORTABLE CHEST 1 VIEW COMPARISON:  May 22, 2021 FINDINGS: Tracheostomy tube appears appropriate in positioning. Unchanged ECMO cannula. Nasogastric and enteric feeding catheter coursing below the diaphragm with the NG tube tip overlying the stomach and enteric feeding catheter tip excluded by collimation. Right upper extremity PICC appears unchanged in positioning likely terminating within the SVC positioning with exact may difficult by the superimposed ECMO cannula. Prior median sternotomy and CABG. The heart size and mediastinal contours are partially obscured but appear unchanged. Similar diffuse bilateral airspace opacities and bilateral pleural effusions. No visible pneumothorax. Extensive left greater than right subcutaneous gas is  similar prior. Displaced right first rib fracture appears unchanged. IMPRESSION: 1. Stable support lines and tubes described above. 2. Similar diffuse bilateral airspace opacities and bilateral pleural effusions. 3. Extensive left greater than right subcutaneous gas is unchanged from prior. No visible pneumothorax. Electronically Signed   By: Dahlia Bailiff M.D.   On: 06/12/2021 08:16    Cardiac Studies   Cath 05/11/2021 Distal left main [place] 111 bifurcation stenosis with 75% left main, 90% ostial to proximal LAD, and 80-90% ostial circumflex (difficult to assess due to heavy calcification). Severe mid circumflex disease with 70% eccentric mid stenosis and second obtuse marginal containing ostial to proximal greater than 80% stenosis.  (Bifurcation [place] 111 Severe calcification in left main and LAD in particular with diffuse 50% narrowing from proximal to mid vessel and tandem 70% stenoses in the mid LAD. Nondominant right coronary Normal LV function.  EF 55%.  LVEDP normal.    Patient Profile     71 y.o. male with PMH of PVCs presented with chest pain. Cardiac cath by Dr. Tamala Julian on 05/15/2021 showed 75% left main, 90% ost to prox LAD, 80-90% ost LCx, 70% mid LCx, 80% OM2, 50% prox to mid LAD, 70% mid LAD, EF 55%. Patient underwent CABG x 3 on 04/25/2021. Post op course complicated afib, treated with amio. CXR showed opacity in bilateral lung, started abx on 1/25 and diuretic. Started on Eliquis due to recurrence of afib.    Assessment &  Plan    1. CAD: Admitted with unstable angina, cath with severe left main and proximal LAD/LCx disease (nondominant RCA).  CABG x 3 on 1/18 with LIMA-LAD, SVG-OM, SVG-left PDA.  No s/s angina - Continue ASA 81, statin.  2. 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 RHC 1/30 with equalization of diastolic pressures.  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  Repeated echo 2/2 still showed respirophasic septal variation but not as impressive. CVP 7-8 today.  Lungs still with diffuse bilateral infiltrates on CXR.  Diuresed well with IV Lasix gtt at 4. Weight back down to baseline. Still edematous. Will continue unless he has further chugging - Continue Lasix 4 mg/hr  - With concern for development of post-surgical effusive/constrictive pericarditis, he is on colchicine.  - ?need for surgical intervention on effusive/constrictive pericarditis. Will need improvement of lung disease first then reassess, repeat echo not as impressive.   3. 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  Suspect primarily septic/vasodilatory shock. He is now off pressors.  4. Hiccups: Intractable initially, now improved.  - On baclofen and gabapentin.  5. PNA with acute hypoxic respiratory failure: Of note, he does appear to have had some pre-existing ILD from 2019 CT chest (?sarcoidosis). CXR with persistent bilateral infiltrates, possible mild improvement compared to yesterday.  Have thought most likely aspiration PNA/pneumonitis in setting of intractable hiccups. He has developed ARDS. COVID was negative.  FiO2 0.5 on vent.  Has tracheostomy. CT chest 2/9 with persistent lung infiltrates.  Completed vancomycin and meropenem => MRSE 1/2 blood and trach aspirate, now back on vancomycin.  Suspect ongoing aspiration with TFs noted in trach on 2/16, now with NGT to suction and post-pyloric feeding tube.  LDH stable today. Stable ECMO circuit.  FiO2 0.3  Stable ABG. Sweep 6.  CXR with concerns for developing honeycombing/fibrosis in  LUL.  Had subcutaneous air with more aggressive PEEP, now back to lung protective ventilation.   - Vent and sedation per CCM.  - Solumedrol 20 mg IV daily with gradual taper, ESR now normal.  - Back on amiodarone but suspect not amiodarone lung toxicity.   - Now off antibiotics.  - Repeat CT chest Monday to look for any improvement.  At that point, will need discussions about future plan.  6. Atypical atrial flutter/PVCs:  DCCV 2/17.  NSR this morning.   - Continue IV amiodarone.    - Bivalirudin gtt, goal PTT 50-80. PTT 61 today 7. AKI: Creatinine stable 0.93. Follow closely.  8. Elevated LFTs: Suspect shock liver, follow CMET.  LFTs have trended down.  9. Anemia: Hgb stable today, transfuse < 8.     10. Hypernatremia: Na 142 today.  - Continue free water 200 q4.  11. HTN: BP rises with sedation wean.  - prn hydralazine for now.  12. Bradycardia/asystole: Vagally mediated with cough.  Resolves rapidly with resolution of cough.  - Atropine at bedside.  13. FEN: TFs restarted with post-pyloric tube and also NGT to suction to prevent aspiration.   - Continue Reglan.   14. Mobilize as much as possible with PT.  Limit sedation.   Long discussion with son and wife with CCM.  CXR now concerning for honeycombing/fibrosis. Given age and progressive MSOF with little progress after 3 weeks of ECMO we discussed fact that prognosis is not looking favorable. Will await CT on Monday. If no marked improvement in lung parenchyma would strongly consider switch to comfort care.   CRITICAL CARE Performed by: Glori Bickers  Total critical care time: 45 minutes  Critical care time was exclusive of separately billable procedures and treating other patients.  Critical care was necessary to treat or prevent imminent or life-threatening deterioration.  Critical care was time spent personally by me on the following activities: development of treatment plan with patient and/or surrogate as well as nursing,  discussions with consultants, evaluation of patient's response to treatment, examination of patient, obtaining history from patient or surrogate, ordering and performing treatments and interventions, ordering and review of laboratory studies, ordering and review of radiographic studies, pulse oximetry and re-evaluation of patient's condition.  Glori Bickers MD 06/13/2021 2:23 PM

## 2021-06-13 NOTE — Progress Notes (Signed)
ANTICOAGULATION CONSULT NOTE  Pharmacy Consult for Bivalirudin Indication:  ECMO  Allergies  Allergen Reactions   Amoxicillin-Pot Clavulanate     Per pt report on 08/19/20, makes his urine dark colored   Atorvastatin Other (See Comments)     ( pt states causing runny nose, headaches, issue w/ urination)   Plant Derived Enzymes     Other reaction(s): Unknown   Trichophyton Other (See Comments)    Patient Measurements: Height: 5\' 10"  (177.8 cm) Weight: 80.3 kg (177 lb 0.5 oz) IBW/kg (Calculated) : 73  Vital Signs: Temp: 98.2 F (36.8 C) (02/25 0800) Temp Source: Core (02/25 0800) BP: 167/68 (02/25 1115) Pulse Rate: 90 (02/25 1115)  Labs: Recent Labs    06/11/21 0402 06/11/21 0406 06/12/21 0314 06/12/21 0315 06/12/21 1700 06/12/21 1927 06/13/21 0220 06/13/21 0311 06/13/21 0737  HGB 8.4*   < > 9.9*   < > 11.0*   < > 11.9* 11.9* 12.9*  HCT 28.2*   < > 32.2*   < > 34.3*   < > 37.1* 35.0* 38.0*  PLT 90*   < > 86*  --  82*  --  90*  --   --   APTT 65*   < > 65*  --  70*  --  61*  --   --   LABPROT 24.5*  --   --   --   --   --   --   --   --   INR 2.2*  --   --   --   --   --   --   --   --   CREATININE 0.96   < > 0.93  --  0.88  --  0.94  --   --    < > = values in this interval not displayed.     Estimated Creatinine Clearance: 74.4 mL/min (by C-G formula based on SCr of 0.94 mg/dL).  Assessment: 71 year old male with coronary artery disease who initially presented with chest pain, noted to have multivessel coronary artery disease. He underwent CABG x3 on 04/29/2021; course was complicated with atrial fibrillation, frequent hiccups and aspiration pneumonia leading to hypoxia and increasing oxygen requirement. Patient now requiring VV ECMO. Pharmacy consulted for bivalirudin IV.  aPTT therapeutic at 61 sec on bivalirudin drip 0.075mg /kg/hr; Hgb stable 12.9, pltc dropped 80 > now back to 90;  LDH stable 200s, fibrinogen up 519 Oozing from NG tube and cannulation site  -stable.  Goal of Therapy:  aPTT 50-80 seconds Monitor platelets by anticoagulation protocol: Yes   Plan:  Continue bivalirudin at 0.075 mg/kg/hr Monitor q12h aptt and CBC Monitor closely for s/sx bleeding/thrombus   [place] Crane, Roylene Reason, [facility] Clinical Pharmacist  06/13/2021 11:42 AM   [facility] pharmacy phone numbers are listed on [email or web address]

## 2021-06-13 NOTE — Progress Notes (Signed)
Patient ID: Randall Hodges, male   DOB: 1950/06/05, 71 y.o.   MRN: [id number] Extracorporeal support note  ECLS support day: 22 (circuit change 2/14) Indication: ARDS  Configuration: VV  Drainage cannula: Crescent R IJ Return cannula: Crescent R IJ  Pump speed: 3600 rpm Pump flow: 4.8 L/min Pump used: Cardiohelp  Sweep gas: 6  Circuit check: Good color change. LDH stable 219.  Anticoagulant: Bivalirudin, PTT goal 50-80. PTT 61  Changes in support: No changes today.   Anticipated goals/duration of support: Wean to discontinuation. Repeat chest CT on Monday to help prognosticate.   Glori Bickers, MD  2:21 PM

## 2021-06-13 NOTE — Progress Notes (Addendum)
°  Error. Note entered in wrong chart.   Glori Bickers, MD  7:49 AM

## 2021-06-14 ENCOUNTER — Inpatient Hospital Stay (HOSPITAL_COMMUNITY): Payer: PPO

## 2021-06-14 DIAGNOSIS — J9602 Acute respiratory failure with hypercapnia: Secondary | ICD-10-CM | POA: Diagnosis not present

## 2021-06-14 DIAGNOSIS — J9601 Acute respiratory failure with hypoxia: Secondary | ICD-10-CM | POA: Diagnosis not present

## 2021-06-14 DIAGNOSIS — I249 Acute ischemic heart disease, unspecified: Secondary | ICD-10-CM | POA: Diagnosis not present

## 2021-06-14 LAB — POCT I-STAT 7, (LYTES, BLD GAS, ICA,H+H)
Acid-Base Excess: 0 mmol/L (ref 0.0–2.0)
Acid-Base Excess: 2 mmol/L (ref 0.0–2.0)
Acid-Base Excess: 4 mmol/L — ABNORMAL HIGH (ref 0.0–2.0)
Acid-Base Excess: 5 mmol/L — ABNORMAL HIGH (ref 0.0–2.0)
Acid-Base Excess: 5 mmol/L — ABNORMAL HIGH (ref 0.0–2.0)
Acid-Base Excess: 6 mmol/L — ABNORMAL HIGH (ref 0.0–2.0)
Acid-Base Excess: 7 mmol/L — ABNORMAL HIGH (ref 0.0–2.0)
Bicarbonate: 24.2 mmol/L (ref 20.0–28.0)
Bicarbonate: 27.3 mmol/L (ref 20.0–28.0)
Bicarbonate: 30.4 mmol/L — ABNORMAL HIGH (ref 20.0–28.0)
Bicarbonate: 30.7 mmol/L — ABNORMAL HIGH (ref 20.0–28.0)
Bicarbonate: 30.9 mmol/L — ABNORMAL HIGH (ref 20.0–28.0)
Bicarbonate: 31.1 mmol/L — ABNORMAL HIGH (ref 20.0–28.0)
Bicarbonate: 32 mmol/L — ABNORMAL HIGH (ref 20.0–28.0)
Calcium, Ion: 1.15 mmol/L (ref 1.15–1.40)
Calcium, Ion: 1.2 mmol/L (ref 1.15–1.40)
Calcium, Ion: 1.21 mmol/L (ref 1.15–1.40)
Calcium, Ion: 1.22 mmol/L (ref 1.15–1.40)
Calcium, Ion: 1.23 mmol/L (ref 1.15–1.40)
Calcium, Ion: 1.26 mmol/L (ref 1.15–1.40)
Calcium, Ion: 1.28 mmol/L (ref 1.15–1.40)
HCT: 27 % — ABNORMAL LOW (ref 39.0–52.0)
HCT: 27 % — ABNORMAL LOW (ref 39.0–52.0)
HCT: 28 % — ABNORMAL LOW (ref 39.0–52.0)
HCT: 29 % — ABNORMAL LOW (ref 39.0–52.0)
HCT: 32 % — ABNORMAL LOW (ref 39.0–52.0)
HCT: 37 % — ABNORMAL LOW (ref 39.0–52.0)
HCT: 39 % (ref 39.0–52.0)
Hemoglobin: 10.9 g/dL — ABNORMAL LOW (ref 13.0–17.0)
Hemoglobin: 12.6 g/dL — ABNORMAL LOW (ref 13.0–17.0)
Hemoglobin: 13.3 g/dL (ref 13.0–17.0)
Hemoglobin: 9.2 g/dL — ABNORMAL LOW (ref 13.0–17.0)
Hemoglobin: 9.2 g/dL — ABNORMAL LOW (ref 13.0–17.0)
Hemoglobin: 9.5 g/dL — ABNORMAL LOW (ref 13.0–17.0)
Hemoglobin: 9.9 g/dL — ABNORMAL LOW (ref 13.0–17.0)
O2 Saturation: 82 %
O2 Saturation: 88 %
O2 Saturation: 92 %
O2 Saturation: 92 %
O2 Saturation: 93 %
O2 Saturation: 94 %
O2 Saturation: 95 %
Patient temperature: 36
Patient temperature: 36.1
Patient temperature: 36.8
Patient temperature: 36.9
Patient temperature: 36.9
Patient temperature: 37
Patient temperature: 37
Potassium: 3.9 mmol/L (ref 3.5–5.1)
Potassium: 4 mmol/L (ref 3.5–5.1)
Potassium: 4 mmol/L (ref 3.5–5.1)
Potassium: 4.1 mmol/L (ref 3.5–5.1)
Potassium: 4.5 mmol/L (ref 3.5–5.1)
Potassium: 4.6 mmol/L (ref 3.5–5.1)
Potassium: 4.6 mmol/L (ref 3.5–5.1)
Sodium: 143 mmol/L (ref 135–145)
Sodium: 143 mmol/L (ref 135–145)
Sodium: 143 mmol/L (ref 135–145)
Sodium: 143 mmol/L (ref 135–145)
Sodium: 144 mmol/L (ref 135–145)
Sodium: 144 mmol/L (ref 135–145)
Sodium: 144 mmol/L (ref 135–145)
TCO2: 25 mmol/L (ref 22–32)
TCO2: 29 mmol/L (ref 22–32)
TCO2: 32 mmol/L (ref 22–32)
TCO2: 32 mmol/L (ref 22–32)
TCO2: 32 mmol/L (ref 22–32)
TCO2: 33 mmol/L — ABNORMAL HIGH (ref 22–32)
TCO2: 34 mmol/L — ABNORMAL HIGH (ref 22–32)
pCO2 arterial: 37.2 mmHg (ref 32–48)
pCO2 arterial: 40.2 mmHg (ref 32–48)
pCO2 arterial: 45 mmHg (ref 32–48)
pCO2 arterial: 46.4 mmHg (ref 32–48)
pCO2 arterial: 46.5 mmHg (ref 32–48)
pCO2 arterial: 52.4 mmHg — ABNORMAL HIGH (ref 32–48)
pCO2 arterial: 55.2 mmHg — ABNORMAL HIGH (ref 32–48)
pH, Arterial: 7.356 (ref 7.35–7.45)
pH, Arterial: 7.378 (ref 7.35–7.45)
pH, Arterial: 7.39 (ref 7.35–7.45)
pH, Arterial: 7.421 (ref 7.35–7.45)
pH, Arterial: 7.424 (ref 7.35–7.45)
pH, Arterial: 7.441 (ref 7.35–7.45)
pH, Arterial: 7.492 — ABNORMAL HIGH (ref 7.35–7.45)
pO2, Arterial: 48 mmHg — ABNORMAL LOW (ref 83–108)
pO2, Arterial: 48 mmHg — ABNORMAL LOW (ref 83–108)
pO2, Arterial: 61 mmHg — ABNORMAL LOW (ref 83–108)
pO2, Arterial: 62 mmHg — ABNORMAL LOW (ref 83–108)
pO2, Arterial: 65 mmHg — ABNORMAL LOW (ref 83–108)
pO2, Arterial: 67 mmHg — ABNORMAL LOW (ref 83–108)
pO2, Arterial: 81 mmHg — ABNORMAL LOW (ref 83–108)

## 2021-06-14 LAB — GLUCOSE, CAPILLARY
Glucose-Capillary: 141 mg/dL — ABNORMAL HIGH (ref 70–99)
Glucose-Capillary: 124 mg/dL — ABNORMAL HIGH (ref 70–99)
Glucose-Capillary: 122 mg/dL — ABNORMAL HIGH (ref 70–99)
Glucose-Capillary: 117 mg/dL — ABNORMAL HIGH (ref 70–99)
Glucose-Capillary: 115 mg/dL — ABNORMAL HIGH (ref 70–99)
Glucose-Capillary: 83 mg/dL (ref 70–99)

## 2021-06-14 LAB — BASIC METABOLIC PANEL
Anion gap: 8 (ref 5–15)
Anion gap: 9 (ref 5–15)
BUN: 44 mg/dL — ABNORMAL HIGH (ref 8–23)
BUN: 44 mg/dL — ABNORMAL HIGH (ref 8–23)
CO2: 29 mmol/L (ref 22–32)
CO2: 28 mmol/L (ref 22–32)
Calcium: 8.2 mg/dL — ABNORMAL LOW (ref 8.9–10.3)
Calcium: 8.2 mg/dL — ABNORMAL LOW (ref 8.9–10.3)
Chloride: 106 mmol/L (ref 98–111)
Chloride: 106 mmol/L (ref 98–111)
Creatinine, Ser: 0.86 mg/dL (ref 0.61–1.24)
Creatinine, Ser: 0.79 mg/dL (ref 0.61–1.24)
GFR, Estimated: >60 mL/min (ref >60)
GFR, Estimated: >60 mL/min (ref >60)
Glucose, Bld: 139 mg/dL — ABNORMAL HIGH (ref 70–99)
Glucose, Bld: 131 mg/dL — ABNORMAL HIGH (ref 70–99)
Potassium: 4 mmol/L (ref 3.5–5.1)
Potassium: 4.5 mmol/L (ref 3.5–5.1)
Sodium: 143 mmol/L (ref 135–145)
Sodium: 143 mmol/L (ref 135–145)

## 2021-06-14 LAB — CBC
HCT: 31.3 % — ABNORMAL LOW (ref 39.0–52.0)
HCT: 32.3 % — ABNORMAL LOW (ref 39.0–52.0)
Hemoglobin: 10.2 g/dL — ABNORMAL LOW (ref 13.0–17.0)
Hemoglobin: 9.9 g/dL — ABNORMAL LOW (ref 13.0–17.0)
MCH: 30.4 pg (ref 26.0–34.0)
MCH: 29.8 pg (ref 26.0–34.0)
MCHC: 32.6 g/dL (ref 30.0–36.0)
MCHC: 30.7 g/dL (ref 30.0–36.0)
MCV: 93.2 fL (ref 80.0–100.0)
MCV: 97.3 fL (ref 80.0–100.0)
Platelets: 74 10*3/uL — ABNORMAL LOW (ref 150–400)
Platelets: 76 10*3/uL — ABNORMAL LOW (ref 150–400)
RBC: 3.36 MIL/uL — ABNORMAL LOW (ref 4.22–5.81)
RBC: 3.32 MIL/uL — ABNORMAL LOW (ref 4.22–5.81)
RDW: 17.7 % — ABNORMAL HIGH (ref 11.5–15.5)
RDW: 17.3 % — ABNORMAL HIGH (ref 11.5–15.5)
WBC: 9.2 10*3/uL (ref 4.0–10.5)
WBC: 8.4 10*3/uL (ref 4.0–10.5)
nRBC: 0 % (ref 0.0–0.2)
nRBC: 0 % (ref 0.0–0.2)

## 2021-06-14 LAB — HEPATIC FUNCTION PANEL
ALT: 38 U/L (ref 0–44)
AST: 20 U/L (ref 15–41)
Albumin: 2.8 g/dL — ABNORMAL LOW (ref 3.5–5.0)
Alkaline Phosphatase: 74 U/L (ref 38–126)
Bilirubin, Direct: 0.2 mg/dL (ref 0.0–0.2)
Indirect Bilirubin: 0.6 mg/dL (ref 0.3–0.9)
Total Bilirubin: 0.8 mg/dL (ref 0.3–1.2)
Total Protein: 4.5 g/dL — ABNORMAL LOW (ref 6.5–8.1)

## 2021-06-14 LAB — APTT
aPTT: 69 seconds — ABNORMAL HIGH (ref 24–36)
aPTT: 72 seconds — ABNORMAL HIGH (ref 24–36)

## 2021-06-14 LAB — FIBRINOGEN: Fibrinogen: 406 mg/dL (ref 210–475)

## 2021-06-14 LAB — LACTATE DEHYDROGENASE: LDH: 192 U/L (ref 98–192)

## 2021-06-14 MED ORDER — FUROSEMIDE 10 MG/ML IJ SOLN
2.0000 mg/h | INTRAVENOUS | Status: DC
  Administered 2021-06-14: 23:00:00 4 mg/h via INTRAVENOUS
  Administered 2021-06-16: 2 mg/h via INTRAVENOUS
  Filled 2021-06-14 (×2): qty 20

## 2021-06-14 MED ORDER — VANCOMYCIN HCL 1750 MG/350ML IV SOLN
1750.0000 mg | INTRAVENOUS | Status: DC
  Administered 2021-06-14 – 2021-06-16 (×3): 1750 mg via INTRAVENOUS
  Filled 2021-06-14 (×3): qty 350

## 2021-06-14 MED ORDER — FUROSEMIDE 10 MG/ML IJ SOLN
80.0000 mg | Freq: Once | INTRAMUSCULAR | Status: AC
  Administered 2021-06-14: 80 mg via INTRAVENOUS
  Filled 2021-06-14: qty 8

## 2021-06-14 MED ORDER — SODIUM CHLORIDE 0.9 % IV SOLN
2.0000 g | Freq: Three times a day (TID) | INTRAVENOUS | Status: DC
  Administered 2021-06-14 – 2021-06-17 (×9): 2 g via INTRAVENOUS
  Filled 2021-06-14 (×10): qty 2

## 2021-06-14 NOTE — Progress Notes (Signed)
ANTICOAGULATION CONSULT NOTE  Pharmacy Consult for Bivalirudin Indication:  ECMO  Allergies  Allergen Reactions   Amoxicillin-Pot Clavulanate     Per pt report on 08/19/20, makes his urine dark colored   Atorvastatin Other (See Comments)     ( pt states causing runny nose, headaches, issue w/ urination)   Plant Derived Enzymes     Other reaction(s): Unknown   Trichophyton Other (See Comments)    Patient Measurements: Height: 5\' 10"  (177.8 cm) Weight: 82 kg (180 lb 12.4 oz) IBW/kg (Calculated) : 73  Vital Signs: Temp: 98.6 F (37 C) (02/26 1600) Temp Source: Core (02/26 1200) BP: 131/62 (02/26 1630) Pulse Rate: 78 (02/26 1630)  Labs: Recent Labs    06/13/21 1620 06/13/21 1628 06/13/21 2021 06/14/21 0217 06/14/21 0306 06/14/21 1159 06/14/21 1600 06/14/21 1612  HGB 11.5*   < > 10.3* 10.2*   < > 9.5* 9.9* 13.3  HCT 35.9*   < > 32.0* 31.3*   < > 28.0* 32.3* 39.0  PLT 86*  --  73* 74*  --   --  76*  --   APTT 58*  --   --  69*  --   --  72*  --   CREATININE 0.85  --   --  0.86  --   --  0.79  --    < > = values in this interval not displayed.     Estimated Creatinine Clearance: 87.4 mL/min (by C-G formula based on SCr of 0.79 mg/dL).  Assessment: 71 year old male with coronary artery disease who initially presented with chest pain, noted to have multivessel coronary artery disease. He underwent CABG x3 on 05/11/2021; course was complicated with atrial fibrillation, frequent hiccups and aspiration pneumonia leading to hypoxia and increasing oxygen requirement. Patient now requiring VV ECMO. Pharmacy consulted for bivalirudin IV.  aPTT therapeutic at 72 sec on bivalirudin drip 0.075mg /kg/hr; Hgb 13.3. NGT suction bilious with some red tint - MDs aware.  Goal of Therapy:  aPTT 50-80 seconds Monitor platelets by anticoagulation protocol: Yes   Plan:  Continue bivalirudin at 0.075 mg/kg/hr Monitor q12h aptt and CBC Monitor closely for s/sx bleeding/thrombus  Sherlon Handing, PharmD, BCPS Please see amion for complete clinical pharmacist phone list  06/14/2021 5:05 PM

## 2021-06-14 NOTE — Progress Notes (Signed)
ANTICOAGULATION CONSULT NOTE  Pharmacy Consult for Bivalirudin Indication:  ECMO  Allergies  Allergen Reactions   Amoxicillin-Pot Clavulanate     Per pt report on 08/19/20, makes his urine dark colored   Atorvastatin Other (See Comments)     ( pt states causing runny nose, headaches, issue w/ urination)   Plant Derived Enzymes     Other reaction(s): Unknown   Trichophyton Other (See Comments)    Patient Measurements: Height: 5\' 10"  (177.8 cm) Weight: 82 kg (180 lb 12.4 oz) IBW/kg (Calculated) : 73  Vital Signs: Temp: 98.6 F (37 C) (02/26 1200) Temp Source: Core (02/26 1200) BP: 147/65 (02/26 1515) Pulse Rate: 80 (02/26 1515)  Labs: Recent Labs    06/13/21 0220 06/13/21 0311 06/13/21 1620 06/13/21 1628 06/13/21 2021 06/14/21 0217 06/14/21 0306 06/14/21 0752 06/14/21 1159  HGB 11.9*   < > 11.5*   < > 10.3* 10.2* 9.9* 10.9* 9.5*  HCT 37.1*   < > 35.9*   < > 32.0* 31.3* 29.0* 32.0* 28.0*  PLT 90*  --  86*  --  73* 74*  --   --   --   APTT 61*  --  58*  --   --  69*  --   --   --   CREATININE 0.94  --  0.85  --   --  0.86  --   --   --    < > = values in this interval not displayed.     Estimated Creatinine Clearance: 81.3 mL/min (by C-G formula based on SCr of 0.86 mg/dL).  Assessment: 71 year old male with coronary artery disease who initially presented with chest pain, noted to have multivessel coronary artery disease. He underwent CABG x3 on 05/19/2021; course was complicated with atrial fibrillation, frequent hiccups and aspiration pneumonia leading to hypoxia and increasing oxygen requirement. Patient now requiring VV ECMO. Pharmacy consulted for bivalirudin IV.  aPTT therapeutic at 69 sec on bivalirudin drip 0.075mg /kg/hr; Hgb 11.2, pltc 86> 74;  LDH stable 200s, fibrinogen up 519. NGT suction bilious with some red tint - MDs aware.  Goal of Therapy:  aPTT 50-80 seconds Monitor platelets by anticoagulation protocol: Yes   Plan:  Continue bivalirudin at  0.075 mg/kg/hr Monitor q12h aptt and CBC Monitor closely for s/sx bleeding/thrombus  [place] Crane, Vena [place], BCPS, BCCP Clinical Pharmacist  06/14/2021 3:29 PM   [facility] pharmacy phone numbers are listed on [email or web address]

## 2021-06-14 NOTE — Progress Notes (Signed)
No improvement  NAME:  Randall Hodges, MRN:  [id number], DOB:  [birth year], LOS: 74 ADMISSION DATE:  05/17/2021, CONSULTATION DATE: 05/13/2021 REFERRING MD:  Dahlia Byes, MD, CHIEF COMPLAINT: Increasing shortness of breath  History of Present Illness:  71 year old male with coronary artery disease who initially presented with chest pain, noted to have multivessel coronary artery disease, he underwent CABG x3 on 04/1818 23, course was complicated with atrial fibrillation, frequent hiccups and aspiration pneumonia leading to hypoxia and increasing oxygen requirement. PCCM was consulted for evaluation and help with management  Patient stated hiccups are better but continued complain of shortness of breath, cough unable to bring up phlegm.  Pertinent  Medical History  CAD Allergic rhinitis Prostate cancer GERD Pulmonary fibrosis - mild PFT normal.   Significant Hospital Events: Including procedures, antibiotic start and stop dates in addition to other pertinent events   1/18 CABG 1/30 PCCM consult, Worsening O2 needs, likely aspiration, new pressor need 2/2 intubation, placed on VV ECMO 2/5 issues with recirc, bronch neg, improved with supine positioning 2/7 percutaneous tracheostomy 2/10 diuresis and weaning sedation. CT chest shows diffuse ground-glass opacification.  2/11 weaning sedation 2/13 increasing air hunger as oxygenator efficiency decreasing.  2/14 oxygenator changed.  Still having air hunger but able to follow commands and participate with therapy.  2/15 considerable distress with double stacking. Switched to true rest settings - with improvement in distress.  2/16 bronch, NGT placed to LIWS, con't post-pyloric TF 2/17 DCCV overnight for Afib with RVR. Stood at bedside with PT. 2/22 developed subcutaneous air following attempt at lung recruitment. 2/23 back on full rest ventilator settings with minimal spontaneous tidal volume.  Interim History / Subjective:  No  improvement in spontaneous tidal volume.  Patient is less interactive than in days prior.  Not tolerating sedation weaning.  No longer moving as purposefully.  Objective   Blood pressure (!) 157/79, pulse 83, temperature 98.2 F (36.8 C), temperature source Core, resp. rate 11, height 5\' 10"  (1.778 m), weight 82 kg, SpO2 97 %. CVP:  [2 mmHg-13 mmHg] 3 mmHg  Vent Mode: PRVC FiO2 (%):  [30 %] 30 % Set Rate:  [10 bmp] 10 bmp PEEP:  [10 cmH20] 10 cmH20 Plateau Pressure:  [17 cmH20-18 cmH20] 17 cmH20   Intake/Output Summary (Last 24 hours) at 06/14/2021 0901 Last data filed at 06/14/2021 0800 Gross per 24 hour  Intake 2781.47 ml  Output 2825 ml  Net -43.53 ml    Filed Weights   06/12/21 0450 06/13/21 0412 06/14/21 0500  Weight: 80.3 kg 80.3 kg 82 kg   Examination: More sedated today.  Appears comfortable Lungs with poor air movement, triggering vent, on PCV 10/10 tidal volumes back to 70 mL.  Diffuse coarse crackles anteriorly.  Squeaking crackles consistent with fibrosis heard laterally.   In sinus rhythm.  Ectopy suppressed by amiodarone.  Heart sounds are distant.  Ext warm, ECMO circuit with minimal clot, good color change No spontaneous movement to voice  Ancillary tests personally reviewed:   Chest x-ray shows persistent bilateral interstitial opacifications Hypercarbia corrected.  Poor oxygenation. Hemoglobin 10.9  Assessment & Plan:   Acute hypoxic and hypercapnic respiratory failure with hypoxia ARDS Aspiration pneumonitis- recurrent, now with NGT to suction, on reglan, getting postpyloric feeds Hiccups, recurrent aspiration episodes.  ILD, mild- question of chronic aspiration syndrome Agitation, ICU delirium, and air hunger- improved today CAD with UA s/p CABG x 3, (LIMA-LAD, SVG-OM, SVG-left PDA) Afib with RVR, DCCV, NSVT, ectopy- on amio gtt Hypertension  Possible GPC bacteremia vs contaminant- staph epi in 2/4 bottles- s/p 7 days vanc  Plan:  - Lung protective  TV - Sweep wean to pH > 7.35 and/or WOB - NG to LIS, cortrak for PP TF, reglan - VV ECMO and diuresis management per CHF team, diamox and reduce lasix gtt today -con't baclofen, morphine, seroquel, klonipin, melatonin and back on fent drip - colchicine per TCTS - bowel regimen as ordered, rectal management system in place, stool bulkener to be ordered by PharmD - Continue PT/OT efforts - PPI BID - Finished trial of steroids, did not seem to have effect - Aspirin, statin - Continue IV amiodarone for frequent ectopy and questionable GI absorption - Hydralazine PRN; can add scheduled if needed  Continue current aggressive support until 2/27 and get a repeat CT that day.  If no improvement in GGO or there are fibrotic changes, we will need to consider setting a deadline for continued ECMO support due to futility.  Discussed with CHF/TCTS teams and family.  Best Practice (right click and "Reselect all SmartList Selections" daily)   Diet/type: TF DVT prophylaxis: Bival CBG: SSI GI prophylaxis: PPI Lines: L [place] TLC, RIJ  Foley:  Yes Code Status:  full code Last date of multidisciplinary goals of care discussion [2/16]   CRITICAL CARE Performed by: Kipp Brood  Total critical care time: 40 minutes  Critical care time was exclusive of separately billable procedures and treating other patients.  Critical care was necessary to treat or prevent imminent or life-threatening deterioration.  Critical care was time spent personally by me on the following activities: development of treatment plan with patient and/or surrogate as well as nursing, discussions with consultants, evaluation of patient's response to treatment, examination of patient, obtaining history from patient or surrogate, ordering and performing treatments and interventions, ordering and review of laboratory studies, ordering and review of radiographic studies, pulse oximetry, re-evaluation of patient's condition and  participation in multidisciplinary rounds.  Kipp Brood, MD [facility] ICU Physician [place]  Pager: [phone] Mobile: [phone] After hours: [phone].  Prefer epic messenger for cross cover needs If after hours, please call E-link

## 2021-06-14 NOTE — Progress Notes (Signed)
Patient ID: Randall Hodges, male   DOB: [birth year], 71 y.o.   MRN: [id number]    Progress Note  Patient Name: Randall Hodges Date of Encounter: 06/14/2021  [facility] Cardiologist: [name], MD   Subjective   2/2: VV ECMO initiation, Crescent catheter right IJ 2/7: Tracheostomy 2/13 CT C/A/P: Diffuse ground-glass opacities consistent with atelectasis and edema presumably related to ECMO physiology and respiratory failure.  Underlying moderate interstitial lung disease mildly progressed from prior CT. 2/15: Oxygenator changed, trach changed.  2/16: Transient asystole with cough and adjustment of tracheostomy overnight.  2/17: Atrial flutter with RVR, required emergent DCCV and amiodarone restarted 2/23: Pneumomediastinum with high PEEP, PEEP decreased  CXR with severe bilateral diffuse disease, concern for developing honeycombing/fibrosis in LUL   Remains on vent  FiO2 0.3. TVs 100-150. Requiring frequent sedation for agitation.   Sweep turned up to 6.5 for rising pCO2. Lasix gtt turned off. Amio increased to 60 for PVCs/NSVT.   Still with bilious drainage from NGT. Started on abx for shivers. Cx pending. Having purulent drainage around trach   ECMO: Speed 3350 rpm Flow 4.5 L/min pVen -79 DeltaP 26 Sweep 6 -> 6.5 PTT 69 (goal 50-80) LDH 192 ABG 7.44/45/67/94% Lactate 1.0  Cardiac Studies: Echo (limited, 1/30): Echo reviewed, there is clear respirophasic variation of the interventricular septum and marked respirophasic variation of E inflow velocity on doppler evaluation of the mitral valve.  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  LV EF 45-50%.   RHC Procedural Findings (on norepinephrine 6): Hemodynamics (mmHg) RA mean 12 RV 37/12 PA 38/16, mean 27 PCWP mean 11 LV  108/12 AO 96/55 PAPI 1.8 Oxygen saturations: PA 54% AO 94% Cardiac Output (Fick) 5.59  Cardiac Index (Fick) 2.81  PVR 2.8 WU Simultaneous RV/LV tracings were obtained.  Difficult to interpret due to atrial fibrillation.  There was some suggestion of discordance (ventricular interdependence) but not clear.  Inpatient Medications    Scheduled Meds:  aspirin  81 mg Per Tube Daily   baclofen  5 mg Per Tube TID   chlorhexidine gluconate (MEDLINE KIT)  15 mL Mouth Rinse BID   Chlorhexidine Gluconate Cloth  6 each Topical Daily   clonazePAM  1 mg Per Tube TID   colchicine  0.6 mg Per Tube Daily   feeding supplement (PROSource TF)  90 mL Per Tube BID   fiber  1 packet Per Tube TID   free water  200 mL Per Tube Q4H   Gerhardt's butt cream   Topical TID   haloperidol lactate  5 mg Intravenous Once   insulin aspart  0-15 Units Subcutaneous Q4H   mouth rinse  15 mL Mouth Rinse 10 times per day   melatonin  5 mg Per Tube QHS   metoCLOPramide (REGLAN) injection  5 mg Intravenous Q8H   morphine  15 mg Per Tube Q6H   pantoprazole (PROTONIX) IV  40 mg Intravenous Q12H   potassium chloride  40 mEq Per Tube BID   QUEtiapine  25 mg Per Tube QHS   rosuvastatin  10 mg Per Tube Daily   sodium chloride flush  10-40 mL Intracatheter Q12H   sodium chloride flush  10-40 mL Intracatheter Q12H   sodium chloride flush  3 mL Intravenous Q12H   Continuous Infusions:  sodium chloride Stopped (06/01/21 1121)   sodium chloride Stopped (06/10/21 1705)  sodium chloride 10 mL/hr at 06/11/21 1816   albumin human 12.5 g (06/10/21 2101)   albumin human 12.5 g (06/13/21 1825)   amiodarone 60 mg/hr (06/14/21 0700)   bivalirudin (ANGIOMAX) infusion 0.5 mg/mL (Non-ACS indications) 0.075 mg/kg/hr (06/14/21 0700)   feeding supplement (VITAL 1.5 CAL) 60 mL/hr at 06/14/21 0700   fentaNYL infusion INTRAVENOUS 50 mcg/hr (06/14/21 0700)   furosemide (LASIX) 200 mg in dextrose 5% 100 mL (26m/mL) infusion Stopped  (06/13/21 1729)   PRN Meds: Place/Maintain arterial line **AND** sodium chloride, sodium chloride, sodium chloride, acetaminophen (TYLENOL) oral liquid 160 mg/5 mL, albumin human, albumin human, diphenhydrAMINE, docusate, fentaNYL (SUBLIMAZE) injection, fentaNYL (SUBLIMAZE) injection, guaiFENesin, hydrALAZINE, levalbuterol, lip balm, ondansetron (ZOFRAN) IV, polyvinyl alcohol, sennosides, sodium chloride flush, white petrolatum   Vital Signs    Vitals:   06/14/21 0600 06/14/21 0700 06/14/21 0715 06/14/21 0730  BP: (!) 191/84 (!) 180/64 (!) 181/73 (!) 158/66  Pulse: 86 82 85 86  Resp: 17 17 (!) 8 15  Temp:      TempSrc:      SpO2: 96% 95% 95% 96%  Weight:      Height:        Intake/Output Summary (Last 24 hours) at 06/14/2021 0751 Last data filed at 06/14/2021 0700 Gross per 24 hour  Intake 2881.94 ml  Output 2840 ml  Net 41.94 ml    Last 3 Weights 06/14/2021 06/13/2021 06/12/2021  Weight (lbs) 180 lb 12.4 oz 177 lb 0.5 oz 177 lb 0.5 oz  Weight (kg) 82 kg 80.3 kg 80.3 kg      Telemetry    NSR 80s personally reviewed.   Physical Exam   General:  Weak appearing. Sedated on vent HEENT: normal Neck: supple. RIJ ECMO  + trach with purulent secretions around trach Carotids 2+ bilat; no bruits. No lymphadenopathy or thryomegaly appreciated. Cor: Regular rate & rhythm. No rubs, gallops or murmurs. Lungs: diffuse crackles Abdomen: soft, nontender, nondistended. No hepatosplenomegaly. No bruits or masses. Good bowel sounds. Extremities: no cyanosis, clubbing, rash, edema Neuro: sedated on vent   Labs    High Sensitivity Troponin:   Recent Labs  Lab 06/05/21 2208 06/06/21 0037 06/06/21 0532  TROPONINIHS 211* 226* 196*       Chemistry Recent Labs  Lab 06/10/21 0233 06/10/21 0455 06/11/21 0402 06/11/21 0406 06/12/21 0314 06/12/21 0315 06/13/21 0220 06/13/21 0311 06/13/21 1620 06/13/21 1628 06/13/21 1930 06/14/21 0217 06/14/21 0306  NA 148*   < > 146*   < >  146*   < > 142   < > 143   < > 145 143 143  K 4.1   < > 4.3   < > 4.1   < > 3.8   < > 4.0   < > 4.1 4.0 4.0  CL 103   < > 103   < > 104   < > 101  --  102  --   --  106  --   CO2 36*   < > 36*   < > 35*   < > 32  --  31  --   --  29  --   GLUCOSE 135*   < > 145*   < > 124*   < > 144*  --  169*  --   --  139*  --   BUN 50*   < > 48*   < > 47*   < > 44*  --  45*  --   --  44*  --   CREATININE 1.01   < > 0.96   < > 0.93   < > 0.94  --  0.85  --   --  0.86  --   CALCIUM 8.1*   < > 8.3*   < > 8.4*   < > 8.4*  --  8.4*  --   --  8.2*  --   MG 2.0  --  2.2  --   --   --   --   --  2.4  --   --   --   --   PROT 4.8*  --  4.8*  --  4.8*  --  5.0*  --   --   --   --  4.5*  --   ALBUMIN 2.9*  --  3.0*  --  2.8*  --  3.1*  --   --   --   --  2.8*  --   AST 31  --  26  --  22  --  24  --   --   --   --  20  --   ALT 70*  --  60*  --  54*  --  50*  --   --   --   --  38  --   ALKPHOS 78  --  77  --  78  --  86  --   --   --   --  74  --   BILITOT 0.5  --  0.6  --  0.5  --  0.8  --   --   --   --  0.8  --   GFRNONAA >60   < > >60   < > >60   < > >60  --  >60  --   --  >60  --   ANIONGAP 9   < > 7   < > 7   < > 9  --  10  --   --  8  --    < > = values in this interval not displayed.     Lipids  No results for input(s): CHOL, TRIG, HDL, LABVLDL, LDLCALC, CHOLHDL in the last 168 hours.   Hematology Recent Labs  Lab 06/13/21 1620 06/13/21 1628 06/13/21 2021 06/14/21 0217 06/14/21 0306  WBC 12.0*  --  9.1 9.2  --   RBC 3.80*  --  3.40* 3.36*  --   HGB 11.5*   < > 10.3* 10.2* 9.9*  HCT 35.9*   < > 32.0* 31.3* 29.0*  MCV 94.5  --  94.1 93.2  --   MCH 30.3  --  30.3 30.4  --   MCHC 32.0  --  32.2 32.6  --   RDW 17.9*  --  17.6* 17.7*  --   PLT 86*  --  73* 74*  --    < > = values in this interval not displayed.    Thyroid No results for input(s): TSH, FREET4 in the last 168 hours.  BNPNo results for input(s): BNP, PROBNP in the last 168 hours.  DDimer No results for input(s): DDIMER in the  last 168 hours.   Radiology    DG CHEST PORT 1 VIEW  Result Date: 06/13/2021 CLINICAL DATA:  Chest pain.  Follow-up exam. EXAM: PORTABLE CHEST 1 VIEW COMPARISON:  06/12/2021 and older studies. FINDINGS: Extensive bilateral airspace lung opacities are without change. No convincing pneumothorax.  No mediastinal widening.  Tracheostomy tube, enteric tube, nasal/orogastric tube and right PICC are stable. Posterior right second rib fracture again noted. Stable neck base subcutaneous emphysema. IMPRESSION: 1. No significant change from the previous day's study. 2. Persistent and extensive bilateral airspace lung opacities with suspected associated pleural effusions. 3. No convincing pneumothorax. 4. Stable support apparatus. Electronically Signed   By: Lajean Manes M.D.   On: 06/13/2021 08:35    Cardiac Studies   Cath 05/04/2021 Distal left main [place] 111 bifurcation stenosis with 75% left main, 90% ostial to proximal LAD, and 80-90% ostial circumflex (difficult to assess due to heavy calcification). Severe mid circumflex disease with 70% eccentric mid stenosis and second obtuse marginal containing ostial to proximal greater than 80% stenosis.  (Bifurcation [place] 111 Severe calcification in left main and LAD in particular with diffuse 50% narrowing from proximal to mid vessel and tandem 70% stenoses in the mid LAD. Nondominant right coronary Normal LV function.  EF 55%.  LVEDP normal.    Patient Profile     71 y.o. male with PMH of PVCs presented with chest pain. Cardiac cath by Dr. Tamala Julian on 05/16/2021 showed 75% left main, 90% ost to prox LAD, 80-90% ost LCx, 70% mid LCx, 80% OM2, 50% prox to mid LAD, 70% mid LAD, EF 55%. Patient underwent CABG x 3 on 05/14/2021. Post op course complicated afib, treated with amio. CXR showed opacity in bilateral lung, started abx on 1/25 and diuretic. Started on Eliquis due to recurrence of afib.    Assessment & Plan    1. CAD: Admitted with unstable angina, cath  with severe left main and proximal LAD/LCx disease (nondominant RCA).  CABG x 3 on 1/18 with LIMA-LAD, SVG-OM, SVG-left PDA.  No s/s angina - Continue ASA 81, statin.  2. 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 RHC 1/30 with equalization of diastolic pressures.  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  Repeated echo 2/2 still showed respirophasic septal variation but not as impressive. CVP 6-7 today.  Lungs still with diffuse bilateral infiltrates on CXR.  Lasix stopped 2/25 due to chugging - Off lasix for now. Can resume as needed - With concern for development of post-surgical effusive/constrictive pericarditis, he is on colchicine.  - ?need for surgical intervention on effusive/constrictive pericarditis. Will need improvement of lung disease first then reassess, repeat echo not as impressive.   3. 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  Suspect primarily septic/vasodilatory shock. He is now off pressors.  4. Hiccups: Intractable initially, now improved.  - On baclofen and gabapentin.  5. PNA with acute hypoxic respiratory failure: Of note, he does appear to have had some pre-existing ILD from 2019 CT chest (?sarcoidosis). CXR with persistent bilateral infiltrates, possible mild improvement compared to yesterday.  Have thought most likely aspiration PNA/pneumonitis in setting of intractable hiccups. He has developed ARDS. COVID was negative.  FiO2 0.5 on vent.  Has tracheostomy. CT chest 2/9 with persistent lung infiltrates.  Completed  vancomycin and meropenem => MRSE 1/2 blood and trach aspirate, now back on vancomycin.  Suspect ongoing aspiration with TFs noted in trach on 2/16, now with NGT to suction and post-pyloric feeding tube.  LDH  stable today. Stable ECMO circuit.  FiO2 0.3  CXR with concerns for developing honeycombing/fibrosis in LUL. Now with purulent secretions around trach. Abx restarted 2/25. Repeat cx pending. ABG worse. Sweep increasing. TVs remain low ~ 100.  - Vent and sedation per CCM.  - Solumedrol 20 mg IV daily with gradual taper, ESR now normal.  - Back on amiodarone but suspect not amiodarone lung toxicity.   - Abx restarted (vanc/cefepime) 2/25. Cx pending   - Repeat CT chest Monday to look for any improvement.  At that point, will need discussions about future plan.  6. Atypical atrial flutter/PVCs:  DCCV 2/17.  NSR this morning.   - Continue IV amiodarone.   (Increased to 60 on 2/25 by CCM due to frequent PVCs/NSVT) - Bivalirudin gtt, goal PTT 50-80. PTT 69 today 7. AKI: Creatinine stable 0.86. Follow closely.  8. Elevated LFTs: Suspect shock liver, follow CMET.  LFTs have trended down.  9. Anemia: Hgb stable today, transfuse < 8.     10. Hypernatremia: Na 143 today.  - Continue free water 200 q4.  11. HTN: BP rises with sedation wean.  - prn hydralazine for now.  12. Bradycardia/asystole: Vagally mediated with cough.  Resolves rapidly with resolution of cough.  - Atropine at bedside.  13. FEN: TFs restarted with post-pyloric tube and also NGT to suction to prevent aspiration.   - Continue Reglan.   14. Physical deconditioning, severe - unable to mobilize due to severe agitation requiring high-level sedation 15. GERD: Severe.  - continue PPI and aspiration precautions  Long discussion with son and wife with CCM yesterday. CXR now concerning for honeycombing/fibrosis. Respiratory status worsening. Given age and progressive MSOF with little progress after 3 weeks of ECMO we discussed fact that  prognosis is not looking favorable. Will await CT tomorrow. If no marked improvement in lung parenchyma would strongly consider switch to comfort care as I think we have reached the limits of what we can do for him. D/w Dr. Prescott Gum.   CRITICAL CARE Performed by: Glori Bickers  Total critical care time: 40 minutes  Critical care time was exclusive of separately billable procedures and treating other patients.  Critical care was necessary to treat or prevent imminent or life-threatening deterioration.  Critical care was time spent personally by me on the following activities: development of treatment plan with patient and/or surrogate as well as nursing, discussions with consultants, evaluation of patient's response to treatment, examination of patient, obtaining history from patient or surrogate, ordering and performing treatments and interventions, ordering and review of laboratory studies, ordering and review of radiographic studies, pulse oximetry and re-evaluation of patient's condition.  Glori Bickers MD 06/14/2021 7:51 AM

## 2021-06-14 NOTE — Progress Notes (Signed)
24 Days Post-Op Procedure(s) (LRB): ECMO CANNULATION (Bilateral) Subjective: Patient examined and today's portable chest x-ray reviewed. I discussed the patient's condition with the patient's wife at the bedside and failure to show improvement in pulmonary pathology and function after 3 weeks of VV ECMO support. I told the patient's wife that I would review the chest CT scan tomorrow.  Cardiac function remains stable in sinus rhythm off pressors. Objective: Vital signs in last 24 hours: Temp:  [98.2 F (36.8 C)-98.6 F (37 C)] 98.6 F (37 C) (02/26 1200) Pulse Rate:  [69-90] 78 (02/26 1300) Cardiac Rhythm: Normal sinus rhythm (02/26 1200) Resp:  [0-39] 29 (02/26 1300) BP: (121-193)/(54-127) 135/59 (02/26 1300) SpO2:  [73 %-99 %] 92 % (02/26 1300) Arterial Line BP: (99-175)/(34-69) 129/49 (02/26 1300) FiO2 (%):  [30 %] 30 % (02/26 1200) Weight:  [82 kg] 82 kg (02/26 0500)  Hemodynamic parameters for last 24 hours: CVP:  [2 mmHg-13 mmHg] 5 mmHg  Intake/Output from previous day: 02/25 0701 - 02/26 0700 In: 2881.9 [I.V.:1017.1; SJ/GG:8366; IV Piggyback:399.9] Out: 2840 [Urine:2115; Emesis/NG output:425; Stool:300] Intake/Output this shift: Total I/O In: 802.7 [I.V.:382.7; NG/GT:420] Out: 475 [Urine:475]  Sternotomy incision clean and dry.  Leg incision healing.  Lab Results: Recent Labs    06/13/21 2021 06/14/21 0217 06/14/21 0306 06/14/21 0752 06/14/21 1159  WBC 9.1 9.2  --   --   --   HGB 10.3* 10.2*   < > 10.9* 9.5*  HCT 32.0* 31.3*   < > 32.0* 28.0*  PLT 73* 74*  --   --   --    < > = values in this interval not displayed.   BMET:  Recent Labs    06/13/21 1620 06/13/21 1628 06/14/21 0217 06/14/21 0306 06/14/21 0752 06/14/21 1159  NA 143   < > 143   < > 143 144  K 4.0   < > 4.0   < > 4.0 4.6  CL 102  --  106  --   --   --   CO2 31  --  29  --   --   --   GLUCOSE 169*  --  139*  --   --   --   BUN 45*  --  44*  --   --   --   CREATININE 0.85  --  0.86  --    --   --   CALCIUM 8.4*  --  8.2*  --   --   --    < > = values in this interval not displayed.    PT/INR: No results for input(s): LABPROT, INR in the last 72 hours. ABG    Component Value Date/Time   PHART 7.424 06/14/2021 1159   HCO3 30.4 (H) 06/14/2021 1159   TCO2 32 06/14/2021 1159   ACIDBASEDEF 2.0 06/05/2021 2053   O2SAT 93 06/14/2021 1159   CBG (last 3)  Recent Labs    06/13/21 2306 06/14/21 0304 06/14/21 0801  GLUCAP 124* 141* 124*    Assessment/Plan: S/P Procedure(s) (LRB): ECMO CANNULATION (Bilateral) Postoperative development of bilateral airspace disease/ARDS from persistent aspiration related to intractable hiccups.  Results of chest CT scan to be performed tomorrow will help clarify prognosis and thus help guide therapy going forward.  The patient's wife expresses understanding and acceptance of this plan.  Appreciate the commitment and excellent care from the ECMO team for Mr. Vassell.  LOS: 40 days    Dahlia Byes 06/14/2021

## 2021-06-15 ENCOUNTER — Inpatient Hospital Stay (HOSPITAL_COMMUNITY): Payer: PPO

## 2021-06-15 DIAGNOSIS — I249 Acute ischemic heart disease, unspecified: Secondary | ICD-10-CM | POA: Diagnosis not present

## 2021-06-15 DIAGNOSIS — J9601 Acute respiratory failure with hypoxia: Secondary | ICD-10-CM | POA: Diagnosis not present

## 2021-06-15 DIAGNOSIS — Z9911 Dependence on respirator [ventilator] status: Secondary | ICD-10-CM | POA: Diagnosis not present

## 2021-06-15 DIAGNOSIS — J9611 Chronic respiratory failure with hypoxia: Secondary | ICD-10-CM

## 2021-06-15 LAB — POCT I-STAT 7, (LYTES, BLD GAS, ICA,H+H)
Acid-Base Excess: 4 mmol/L — ABNORMAL HIGH (ref 0.0–2.0)
Acid-Base Excess: 5 mmol/L — ABNORMAL HIGH (ref 0.0–2.0)
Acid-Base Excess: 5 mmol/L — ABNORMAL HIGH (ref 0.0–2.0)
Acid-Base Excess: 3 mmol/L — ABNORMAL HIGH (ref 0.0–2.0)
Bicarbonate: 28.9 mmol/L — ABNORMAL HIGH (ref 20.0–28.0)
Bicarbonate: 29.9 mmol/L — ABNORMAL HIGH (ref 20.0–28.0)
Bicarbonate: 29.2 mmol/L — ABNORMAL HIGH (ref 20.0–28.0)
Bicarbonate: 26.7 mmol/L (ref 20.0–28.0)
Calcium, Ion: 1.13 mmol/L — ABNORMAL LOW (ref 1.15–1.40)
Calcium, Ion: 1.18 mmol/L (ref 1.15–1.40)
Calcium, Ion: 1.22 mmol/L (ref 1.15–1.40)
Calcium, Ion: 1.18 mmol/L (ref 1.15–1.40)
HCT: 30 % — ABNORMAL LOW (ref 39.0–52.0)
HCT: 33 % — ABNORMAL LOW (ref 39.0–52.0)
HCT: 26 % — ABNORMAL LOW (ref 39.0–52.0)
HCT: 22 % — ABNORMAL LOW (ref 39.0–52.0)
Hemoglobin: 10.2 g/dL — ABNORMAL LOW (ref 13.0–17.0)
Hemoglobin: 11.2 g/dL — ABNORMAL LOW (ref 13.0–17.0)
Hemoglobin: 8.8 g/dL — ABNORMAL LOW (ref 13.0–17.0)
Hemoglobin: 7.5 g/dL — ABNORMAL LOW (ref 13.0–17.0)
O2 Saturation: 89 %
O2 Saturation: 91 %
O2 Saturation: 86 %
O2 Saturation: 88 %
Patient temperature: 36
Patient temperature: 36.9
Patient temperature: 36.9
Patient temperature: 37.1
Potassium: 4.4 mmol/L (ref 3.5–5.1)
Potassium: 3.9 mmol/L (ref 3.5–5.1)
Potassium: 4.2 mmol/L (ref 3.5–5.1)
Potassium: 4 mmol/L (ref 3.5–5.1)
Sodium: 144 mmol/L (ref 135–145)
Sodium: 143 mmol/L (ref 135–145)
Sodium: 144 mmol/L (ref 135–145)
Sodium: 144 mmol/L (ref 135–145)
TCO2: 30 mmol/L (ref 22–32)
TCO2: 31 mmol/L (ref 22–32)
TCO2: 30 mmol/L (ref 22–32)
TCO2: 28 mmol/L (ref 22–32)
pCO2 arterial: 40.3 mmHg (ref 32–48)
pCO2 arterial: 44.2 mmHg (ref 32–48)
pCO2 arterial: 41 mmHg (ref 32–48)
pCO2 arterial: 37.6 mmHg (ref 32–48)
pH, Arterial: 7.46 — ABNORMAL HIGH (ref 7.35–7.45)
pH, Arterial: 7.437 (ref 7.35–7.45)
pH, Arterial: 7.461 — ABNORMAL HIGH (ref 7.35–7.45)
pH, Arterial: 7.46 — ABNORMAL HIGH (ref 7.35–7.45)
pO2, Arterial: 50 mmHg — ABNORMAL LOW (ref 83–108)
pO2, Arterial: 59 mmHg — ABNORMAL LOW (ref 83–108)
pO2, Arterial: 49 mmHg — ABNORMAL LOW (ref 83–108)
pO2, Arterial: 52 mmHg — ABNORMAL LOW (ref 83–108)

## 2021-06-15 LAB — BASIC METABOLIC PANEL
Anion gap: 9 (ref 5–15)
Anion gap: 10 (ref 5–15)
BUN: 47 mg/dL — ABNORMAL HIGH (ref 8–23)
BUN: 49 mg/dL — ABNORMAL HIGH (ref 8–23)
CO2: 29 mmol/L (ref 22–32)
CO2: 28 mmol/L (ref 22–32)
Calcium: 7.9 mg/dL — ABNORMAL LOW (ref 8.9–10.3)
Calcium: 8 mg/dL — ABNORMAL LOW (ref 8.9–10.3)
Chloride: 105 mmol/L (ref 98–111)
Chloride: 105 mmol/L (ref 98–111)
Creatinine, Ser: 0.81 mg/dL (ref 0.61–1.24)
Creatinine, Ser: 1 mg/dL (ref 0.61–1.24)
GFR, Estimated: >60 mL/min (ref >60)
GFR, Estimated: >60 mL/min (ref >60)
Glucose, Bld: 139 mg/dL — ABNORMAL HIGH (ref 70–99)
Glucose, Bld: 141 mg/dL — ABNORMAL HIGH (ref 70–99)
Potassium: 3.7 mmol/L (ref 3.5–5.1)
Potassium: 4.2 mmol/L (ref 3.5–5.1)
Sodium: 143 mmol/L (ref 135–145)
Sodium: 143 mmol/L (ref 135–145)

## 2021-06-15 LAB — GLUCOSE, CAPILLARY
Glucose-Capillary: 103 mg/dL — ABNORMAL HIGH (ref 70–99)
Glucose-Capillary: 112 mg/dL — ABNORMAL HIGH (ref 70–99)
Glucose-Capillary: 112 mg/dL — ABNORMAL HIGH (ref 70–99)
Glucose-Capillary: 135 mg/dL — ABNORMAL HIGH (ref 70–99)
Glucose-Capillary: 56 mg/dL — ABNORMAL LOW (ref 70–99)
Glucose-Capillary: 85 mg/dL (ref 70–99)
Glucose-Capillary: 92 mg/dL (ref 70–99)

## 2021-06-15 LAB — HEPATIC FUNCTION PANEL
ALT: 38 U/L (ref 0–44)
AST: 21 U/L (ref 15–41)
Albumin: 2.5 g/dL — ABNORMAL LOW (ref 3.5–5.0)
Alkaline Phosphatase: 90 U/L (ref 38–126)
Bilirubin, Direct: 0.1 mg/dL (ref 0.0–0.2)
Indirect Bilirubin: 0.6 mg/dL (ref 0.3–0.9)
Total Bilirubin: 0.7 mg/dL (ref 0.3–1.2)
Total Protein: 4.7 g/dL — ABNORMAL LOW (ref 6.5–8.1)

## 2021-06-15 LAB — CBC
HCT: 32.8 % — ABNORMAL LOW (ref 39.0–52.0)
HCT: 27.4 % — ABNORMAL LOW (ref 39.0–52.0)
Hemoglobin: 10.1 g/dL — ABNORMAL LOW (ref 13.0–17.0)
Hemoglobin: 8.4 g/dL — ABNORMAL LOW (ref 13.0–17.0)
MCH: 29.5 pg (ref 26.0–34.0)
MCH: 29.6 pg (ref 26.0–34.0)
MCHC: 30.8 g/dL (ref 30.0–36.0)
MCHC: 30.7 g/dL (ref 30.0–36.0)
MCV: 95.9 fL (ref 80.0–100.0)
MCV: 96.5 fL (ref 80.0–100.0)
Platelets: 78 K/uL — ABNORMAL LOW (ref 150–400)
Platelets: 66 10*3/uL — ABNORMAL LOW (ref 150–400)
RBC: 3.42 MIL/uL — ABNORMAL LOW (ref 4.22–5.81)
RBC: 2.84 MIL/uL — ABNORMAL LOW (ref 4.22–5.81)
RDW: 17.2 % — ABNORMAL HIGH (ref 11.5–15.5)
RDW: 17.2 % — ABNORMAL HIGH (ref 11.5–15.5)
WBC: 9.5 K/uL (ref 4.0–10.5)
WBC: 7.6 10*3/uL (ref 4.0–10.5)
nRBC: 0 % (ref 0.0–0.2)
nRBC: 0 % (ref 0.0–0.2)

## 2021-06-15 LAB — CULTURE, RESPIRATORY W GRAM STAIN
Culture: NORMAL
Gram Stain: NONE SEEN

## 2021-06-15 LAB — APTT
aPTT: 70 seconds — ABNORMAL HIGH (ref 24–36)
aPTT: 75 seconds — ABNORMAL HIGH (ref 24–36)

## 2021-06-15 LAB — LACTATE DEHYDROGENASE: LDH: 203 U/L — ABNORMAL HIGH (ref 98–192)

## 2021-06-15 LAB — FIBRINOGEN: Fibrinogen: 470 mg/dL (ref 210–475)

## 2021-06-15 MED ORDER — MIDAZOLAM HCL 2 MG/2ML IJ SOLN
2.0000 mg | INTRAMUSCULAR | Status: AC
  Administered 2021-06-15: 2 mg via INTRAVENOUS
  Filled 2021-06-15 (×3): qty 2

## 2021-06-15 MED ORDER — MIDAZOLAM HCL 2 MG/2ML IJ SOLN: 2.0000 mg | INTRAMUSCULAR | Status: DC | PRN

## 2021-06-15 MED ORDER — ALBUMIN HUMAN 5 % IV SOLN
INTRAVENOUS | Status: AC
  Filled 2021-06-15: qty 250

## 2021-06-15 MED ORDER — POTASSIUM CHLORIDE 20 MEQ PO PACK
40.0000 meq | PACK | Freq: Once | ORAL | Status: AC
  Administered 2021-06-15: 40 meq
  Filled 2021-06-15: qty 2

## 2021-06-15 MED ORDER — PHENYLEPHRINE 40 MCG/ML (10ML) SYRINGE FOR IV PUSH (FOR BLOOD PRESSURE SUPPORT)
PREFILLED_SYRINGE | INTRAVENOUS | Status: AC
  Filled 2021-06-15: qty 10

## 2021-06-15 MED ORDER — CALCIUM CHLORIDE 10 % IV SOLN
INTRAVENOUS | Status: AC
  Filled 2021-06-15: qty 10

## 2021-06-15 MED ORDER — EPINEPHRINE 1 MG/10ML IJ SOSY
PREFILLED_SYRINGE | INTRAMUSCULAR | Status: DC
Start: 2021-06-15 — End: 2021-06-15
  Filled 2021-06-15: qty 20

## 2021-06-15 MED ORDER — SODIUM BICARBONATE 8.4 % IV SOLN
INTRAVENOUS | Status: AC
  Filled 2021-06-15: qty 50

## 2021-06-15 NOTE — Progress Notes (Signed)
Patient ID: Randall Hodges, male   DOB: [birth year], 71 y.o.   MRN: [id number] Extracorporeal support note  ECLS support day: 24  Indication: ARDS  Configuration: VV  Drainage cannula: Crescent R IJ Return cannula: Crescent R IJ  Pump speed: 3450 rpm Pump flow: 4.7 L/min Pump used: Cardiohelp  Sweep gas: 6  Circuit check: Good color change. LDH stable 203.  Anticoagulant: Bivalirudin, PTT goal 50-80. PTT 70  Changes in support: No changes today.   Anticipated goals/duration of support: Wean to discontinuation. Repeat chest CT today to help prognosticate.   Loralie Champagne, MD  7:40 AM

## 2021-06-15 NOTE — Progress Notes (Signed)
Multidisciplinary team meeting with family to discuss Randall Hodges CT results and ongoing care. Wadie Lessen, NP from [facility], Chaplain Sallyanne Kuster, ECMO coordinator, multiple RNs present during discussion. We reviewed that his CT scan is worse, now showing concerning findings of traction bronchiectasis on top of severe ongoing consolidation. Family is planning on taking him off ECMO and will make decisions tonight about how they wish to proceed with ventilator decisions. In the interim, if he decompensates he will be DNR and family will be called. Discussed with ECMO team.  Julian Hy, DO 06/15/21 6:40 PM [facility]

## 2021-06-15 NOTE — Progress Notes (Signed)
Patient ID: Randall Hodges, male   DOB: [birth year], 71 y.o.   MRN: [id number]     Progress Note from the Palliative Medicine Team at [facility]   Patient Name: Randall Hodges        Date: 06/15/2021 DOB: November 22, 1950  Age: 71 y.o. MRN#: [id number] Attending Physician: Dahlia Byes, MD Primary Care Physician: [name], MD Admit Date: 04/19/2021   Medical records reviewed; labs, providers  notes  71 year old male with coronary artery disease who initially presented with chest pain, noted to have multivessel coronary artery disease, he underwent CABG x3 on 1/18/ 2023, course was complicated with atrial fibrillation, frequent hiccups and aspiration pneumonia leading to hypoxia and increasing oxygen requirement.  Significant Hospital events:  1/18 CABG 1/30 PCCM consult, Worsening O2 needs, likely aspiration, new pressor need 2/2 intubation, placed on VV ECMO 2/5 issues with recirc, bronch neg, improved with supine positioning 2/7 percutaneous tracheostomy 2/10 diuresis and weaning sedation. CT chest shows diffuse ground-glass opacification.  2/11 weaning sedation 2/13 increasing air hunger as oxygenator efficiency decreasing.  2/14 oxygenator changed.  Still having air hunger but able to follow commands and participate with therapy.  2/15 considerable distress with double stacking.  2/17 atrial flutter with RVR, amiodarone restarted  06-10-21 developed subcutaneous air following attempt at lung recruitment. 2/23-23  back on full rest ventilator settings with minimal spontaneous tidal volume.   This NP rounded on patient and met at the bedside with patient's wife, son and daughter along with Dr. Carlis Abbott for continued conversation regarding the seriousness of the current medical such duration and pending treatment option decisions.  Patient remains critically ill on ECMO and ventilator support.Marland Kitchen   Unfortunately chest CT from today:  IMPRESSION: 1. Compared to 05/29/2021,  worsening aeration with nearly airless lungs. Definite pulmonary fibrotic changes with diffuse traction bronchiectasis. Subpleural interstitial lung disease was seen on a 2019 chest CT but not to the extent noted presently. 2. Mild pneumomediastinum. Findings of barotrauma and pulmonary interstitial emphysema at the left upper lobe. 3. Progressive bilateral pleural effusion which are moderate to large. Subpleural nodule in the posterior left chest at the 7-8 and rib interspace, stable from prior. Has there been thoracentesis? 4. Unchanged pericardial fluid and/or thickening. 5. Unremarkable hardware.  Education offered to family present at bedside regarding the fact that the patient has not responded and shown signs of improvement as we had hoped within the context of aggressive life prolonging measures.    Ongoing education regarding the significance of heart and lung disease and the optimal function of the human body as a whole.  It is important now for family to make the difficult decisions of shifting  to a more comfort, allowing for a natural death, hoping to avoid any pain or suffering.  Education offered on the limitations of medical interventions to prolong quality of life.  Discussion around human mortality.  Family members express an understanding of the patient's current medical situation and ultimately all want to avoid suffering.  Family ask for time tonight to process as a family unit before making the decision for a full shift to comfort liberating Mr. Burnham from multiple mechanical life support interventions.  This nurse practitioner and treatment team will meet again with family in the morning for ongoing conversation regarding treatment plan.   Emotional support offered  PMT will continue to support holistically   Questions and concerns addressed     Wadie Lessen NP  Palliative Medicine Team Team Phone # [phone]  Pager [phone]

## 2021-06-15 NOTE — Progress Notes (Signed)
NAME:  Randall Hodges, MRN:  [id number], DOB:  [birth year], LOS: 13 ADMISSION DATE:  05/14/2021, CONSULTATION DATE: 05/01/2021 REFERRING MD:  Dahlia Byes, MD, CHIEF COMPLAINT: Increasing shortness of breath  History of Present Illness:  71 year old male with coronary artery disease who initially presented with chest pain, noted to have multivessel coronary artery disease, he underwent CABG x3 on 04/1818 23, course was complicated with atrial fibrillation, frequent hiccups and aspiration pneumonia leading to hypoxia and increasing oxygen requirement. PCCM was consulted for evaluation and help with management  Patient stated hiccups are better but continued complain of shortness of breath, cough unable to bring up phlegm.  Pertinent  Medical History  CAD Allergic rhinitis Prostate cancer GERD Pulmonary fibrosis   Significant Hospital Events: Including procedures, antibiotic start and stop dates in addition to other pertinent events   1/18 CABG 1/30 PCCM consult, Worsening O2 needs, likely aspiration, new pressor need 2/2 intubation, placed on VV ECMO 2/5 issues with recirc, bronch neg, improved with supine positioning 2/7 percutaneous tracheostomy 2/10 diuresis and weaning sedation. CT chest shows diffuse ground-glass opacification.  2/11 weaning sedation 2/13 increasing air hunger as oxygenator efficiency decreasing.  2/14 oxygenator changed.  Still having air hunger but able to follow commands and participate with therapy.  2/15 considerable distress with double stacking. Switched to true rest settings - with improvement in distress.  2/16 bronch, NGT placed to LIWS, con't post-pyloric TF 2/17 DCCV overnight for Afib with RVR. Stood at bedside with PT. 2/22 developed subcutaneous air following attempt at lung recruitment. 2/23 back on full rest ventilator settings with minimal spontaneous tidal volume.  Interim History / Subjective:  Overnight no significant change.  Objective    Blood pressure (!) 168/72, pulse 83, temperature 98.4 F (36.9 C), temperature source Core, resp. rate (!) 22, height 5\' 10"  (1.778 m), weight 82.2 kg, SpO2 96 %. CVP:  [3 mmHg-9 mmHg] 4 mmHg  Vent Mode: PCV FiO2 (%):  [30 %-50 %] 50 % Set Rate:  [10 bmp] 10 bmp PEEP:  [10 cmH20] 10 cmH20 Plateau Pressure:  [18 cmH20-19 cmH20] 19 cmH20   Intake/Output Summary (Last 24 hours) at 06/15/2021 0842 Last data filed at 06/15/2021 0800 Gross per 24 hour  Intake 4001.64 ml  Output 4100 ml  Net -98.36 ml    Filed Weights   06/13/21 0412 06/14/21 0500 06/15/21 0500  Weight: 80.3 kg 82 kg 82.2 kg   Examination: General: critically ill appearing man on ECMO, lying in bed sedated on MV HENT: [place]/AT, eyes anicteric Neck: RIJ ECMO cannula, trach- no bleeding or drainage Lungs: minimal breath sounds bilaterally, Vt ~70cc on MV Cardiovascular:  S1S2, RRR Abdomen:  NGT to suction with bilious output. Soft, NT. Extremities: pitting edema, no cyanosis.  Neuro: RASS -4, moves nonpurposefully during exam, not responding to stimulation. GU: foley with light yellow urine.  vent 01/26/09/50% with Vt ~68cc ECMO 4.7L, 3450 RPM, Sweep 7L  7.43/44/59/30 BG  80-130s BUN 47 Cr 0.81 LDH 203 H/H 10.1/32.8 Platelets 78 PTT 70 CXR personally reviewed> slightly improved aeration on the R, still dense consolidations on the left 2/25 resp culture> rare GPR, few WBC Blood cultures 2/25> NGTD  Resolved problem list:  hypernatremia  Assessment & Plan:   Acute hypoxic and hypercapnic respiratory failure ARDS Aspiration pneumonitis and pneumonia- normal flora Mild baseline subpleural fibrosis- question if this could be chronic aspiration related. Does not appear to be significantly progressed since CT in 2019. Most of CT abnormalities are acute airspace  disease. -Repeat CT scan today.  Including abdominal CT at the family's request to rule out anatomic reason for recurrent aspirations.  -Con't VV ECMO.  Continuation of this therapy will depend on if he is showing signs of lung recovery on CT scan. Vent is not reassuring that his compliance has improved. -VAP prevention protocol -PAD protocol for sedation. Remains on fentanyl infusion. PRNs available for trip to CT. -routine trach care -ABGs and labs per protocol -con't diuresis -con't bival for circuit AC  Hiccups, recurrent aspiration episodes. Concern for chronic aspiration. -Con't reglan and NGT to LIWS to prevention aspiration.  -Con't PPI BID -con't baclofen -con't post-pyloric feeding -CT adb today to assess esophagus and stomach  CAD with UA s/p CABG x 3, (LIMA-LAD, SVG-OM, SVG-left PDA) NSVT Afib with RVR, DCCV, back in NSR -con't daily aspirin and statin -amiodarone infusion -con't holding coreg due to bradycardia episodes -tele monitoring  Hypertension -hydralazine PRN  Concern for recurrent infection, recultured 2/25 -con't vanc -follow cultures until finalized -con't cefepime and vanc  Hyperglycemia, not needing insulin -SSI PRN  Thrombocytopenia, likely consumption in ECMO circuit -monitor, no indication for transfusion currently  Acute anemia, expected post-op blood loss, consumption from ECMO circuit -Hb goal >8, transfuse PRN -monitor  Acute metabolic encephalopathy, ICU delirium -Con't enteral meds- morphine, seroquel, klonopin. Gabapentin d/c. -keep fentanyl until CT scan -frequent reorientation, family visitation, mobility -pain control  Deconditioning -PT, OT  Discussed plan with ECMO team and updated 3 family members at bedside. All questions answered.   Best Practice (right click and "Reselect all SmartList Selections" daily)   Diet/type: TF DVT prophylaxis: Bival CBG: SSI GI prophylaxis: PPI Lines: L [place] TLC, RIJ  Foley:  Yes Code Status:  full code Last date of multidisciplinary goals of care discussion [ 2/27]   This patient is critically ill with multiple organ system failure  which requires frequent high complexity decision making, assessment, support, evaluation, and titration of therapies. This was completed through the application of advanced monitoring technologies and extensive interpretation of multiple databases. During this encounter critical care time was devoted to patient care services described in this note for 65 minutes.  Julian Hy, DO 06/15/21 9:36 AM [facility]

## 2021-06-15 NOTE — Progress Notes (Signed)
ANTICOAGULATION CONSULT NOTE  Pharmacy Consult for Bivalirudin Indication:  ECMO  Allergies  Allergen Reactions   Amoxicillin-Pot Clavulanate     Per pt report on 08/19/20, makes his urine dark colored   Atorvastatin Other (See Comments)     ( pt states causing runny nose, headaches, issue w/ urination)   Plant Derived Enzymes     Other reaction(s): Unknown   Trichophyton Other (See Comments)    Patient Measurements: Height: 5\' 10"  (177.8 cm) Weight: 82.2 kg (181 lb 3.5 oz) IBW/kg (Calculated) : 73  Vital Signs: Temp: 98.4 F (36.9 C) (02/27 1200) Temp Source: Core (02/27 1200) BP: 124/63 (02/27 1300) Pulse Rate: 80 (02/27 1300)  Labs: Recent Labs    06/14/21 0217 06/14/21 0306 06/14/21 1600 06/14/21 1612 06/15/21 0210 06/15/21 0525 06/15/21 0807  HGB 10.2*   < > 9.9*   < > 10.1* 10.2* 11.2*  HCT 31.3*   < > 32.3*   < > 32.8* 30.0* 33.0*  PLT 74*  --  76*  --  78*  --   --   APTT 69*  --  72*  --  70*  --   --   CREATININE 0.86  --  0.79  --  0.81  --   --    < > = values in this interval not displayed.     Estimated Creatinine Clearance: 86.4 mL/min (by C-G formula based on SCr of 0.81 mg/dL).  Assessment: 71 year old male with coronary artery disease who initially presented with chest pain, noted to have multivessel coronary artery disease. He underwent CABG x3 on 05/07/2021; course was complicated with atrial fibrillation, frequent hiccups and aspiration pneumonia leading to hypoxia and increasing oxygen requirement. Patient now requiring VV ECMO. Pharmacy consulted for bivalirudin IV.  aPTT therapeutic at 70 sec on bivalirudin drip 0.075mg /kg/hr; Hgb 10.1. NGT suction bilious with some red tint - looks more bilious per MD.  Goal of Therapy:  aPTT 50-80 seconds Monitor platelets by anticoagulation protocol: Yes   Plan:  Continue bivalirudin at 0.075 mg/kg/hr Monitor q12h aptt and CBC Monitor closely for s/sx bleeding/thrombus  [place] Crane, Vena [place],  BCPS, BCCP Clinical Pharmacist  06/15/2021 2:03 PM   [facility] pharmacy phone numbers are listed on [email or web address]

## 2021-06-15 NOTE — Progress Notes (Signed)
1 bottle of oral contrast given at 0925 per radiology protocol.

## 2021-06-15 NOTE — Progress Notes (Signed)
This chaplain joined the Pt., family, and medical team to discuss the Pt. condition and choices for next steps. This chaplain understands the Pt. family will spend time together and update the team on Tuesday.  The chaplain understands the Pt.wife-Pam will request spiritual care as needed. The chaplain will communicate with the PMT and the unit.  Chaplain Sallyanne Kuster [phone]

## 2021-06-15 NOTE — Progress Notes (Signed)
ANTICOAGULATION CONSULT NOTE  Pharmacy Consult for Bivalirudin Indication:  ECMO  Allergies  Allergen Reactions   Amoxicillin-Pot Clavulanate     Per pt report on 08/19/20, makes his urine dark colored   Atorvastatin Other (See Comments)     ( pt states causing runny nose, headaches, issue w/ urination)   Plant Derived Enzymes     Other reaction(s): Unknown   Trichophyton Other (See Comments)    Patient Measurements: Height: 5\' 10"  (177.8 cm) Weight: 82.2 kg (181 lb 3.5 oz) IBW/kg (Calculated) : 73  Vital Signs: Temp: 98.4 F (36.9 C) (02/27 1600) Temp Source: Core (02/27 1600) BP: 101/55 (02/27 1627) Pulse Rate: 88 (02/27 1627)  Labs: Recent Labs    06/14/21 0217 06/14/21 0306 06/14/21 1600 06/14/21 1612 06/15/21 0210 06/15/21 0525 06/15/21 0807 06/15/21 1609 06/15/21 1616  HGB 10.2*   < > 9.9*   < > 10.1* 10.2* 11.2*  --  8.8*  HCT 31.3*   < > 32.3*   < > 32.8* 30.0* 33.0*  --  26.0*  PLT 74*  --  76*  --  78*  --   --   --   --   APTT 69*  --  72*  --  70*  --   --  75*  --   CREATININE 0.86  --  0.79  --  0.81  --   --   --   --    < > = values in this interval not displayed.     Estimated Creatinine Clearance: 86.4 mL/min (by C-G formula based on SCr of 0.81 mg/dL).  Assessment: 71 year old male with coronary artery disease who initially presented with chest pain, noted to have multivessel coronary artery disease. He underwent CABG x3 on 04/20/2021; course was complicated with atrial fibrillation, frequent hiccups and aspiration pneumonia leading to hypoxia and increasing oxygen requirement. Patient now requiring VV ECMO. Pharmacy consulted for bivalirudin IV.  PM update: aPTT therapeutic at 75 sec on bivalirudin drip 0.075mg /kg/hr - level drawn appropriately. Hgb 8.8. NGT suction bilious with some continued minimal red tint per RN and minimal persistent bleeding noted around trach and cannula slights. No other signs of bleeding noted.   Goal of Therapy:   aPTT 50-80 seconds Monitor platelets by anticoagulation protocol: Yes   Plan:  Continue bivalirudin at 0.075 mg/kg/hr Monitor q12h aptt and CBC Monitor closely for s/sx bleeding/thrombus  Cristela Felt, PharmD, BCPS Clinical Pharmacist 06/15/2021 5:23 PM

## 2021-06-15 NOTE — Progress Notes (Signed)
Patient ID: Randall Hodges, male   DOB: 12-22-1950, 71 y.o.   MRN: [id number]    Progress Note  Patient Name: Randall Hodges Date of Encounter: 06/15/2021  [facility] Cardiologist: [name], MD   Subjective   2/2: VV ECMO initiation, Crescent catheter right IJ 2/7: Tracheostomy 2/13 CT C/A/P: Diffuse ground-glass opacities consistent with atelectasis and edema presumably related to ECMO physiology and respiratory failure.  Underlying moderate interstitial lung disease mildly progressed from prior CT. 2/15: Oxygenator changed, trach changed.  2/16: Transient asystole with cough and adjustment of tracheostomy overnight.  2/17: Atrial flutter with RVR, required emergent DCCV and amiodarone restarted 2/23: Pneumomediastinum with high PEEP, PEEP decreased  CXR with severe bilateral diffuse disease, concern for developing honeycombing/fibrosis in LUL   Remains on vent  FiO2 0.5. VT 65 cc, sedated again with Fentanyl.    On amiodarone 60 mg/hr with frequent PVCs, no PVCs currently.    Lasix gtt restarted at 4 mg/hr.  I/Os even, no chugging. CVP 6.   Still with bilious drainage from NGT, to suction.   He is back on vancomycin/cefepime  ECMO: Speed 3450 rpm Flow 4.7 L/min pVen -80 DeltaP 27 Sweep 6.5 PTT 70 (goal 50-80) LDH 203 ABG 7.46/40/50/89% => now up to 94%  Cardiac Studies: Echo (limited, 1/30): Echo reviewed, there is clear respirophasic variation of the interventricular septum and marked respirophasic variation of E inflow velocity on doppler evaluation of the mitral valve.  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  LV EF 45-50%.   RHC Procedural Findings (on norepinephrine 6): Hemodynamics (mmHg) RA mean 12 RV 37/12 PA 38/16, mean 27 PCWP mean 11 LV 108/12 AO  96/55 PAPI 1.8 Oxygen saturations: PA 54% AO 94% Cardiac Output (Fick) 5.59  Cardiac Index (Fick) 2.81  PVR 2.8 WU Simultaneous RV/LV tracings were obtained.  Difficult to interpret due to atrial fibrillation.  There was some suggestion of discordance (ventricular interdependence) but not clear.  Inpatient Medications    Scheduled Meds:  aspirin  81 mg Per Tube Daily   baclofen  5 mg Per Tube TID   chlorhexidine gluconate (MEDLINE KIT)  15 mL Mouth Rinse BID   Chlorhexidine Gluconate Cloth  6 each Topical Daily   clonazePAM  1 mg Per Tube TID   colchicine  0.6 mg Per Tube Daily   feeding supplement (PROSource TF)  90 mL Per Tube BID   fiber  1 packet Per Tube TID   free water  200 mL Per Tube Q4H   Gerhardt's butt cream   Topical TID   haloperidol lactate  5 mg Intravenous Once   insulin aspart  0-15 Units Subcutaneous Q4H   mouth rinse  15 mL Mouth Rinse 10 times per day   melatonin  5 mg Per Tube QHS   metoCLOPramide (REGLAN) injection  5 mg Intravenous Q8H   morphine  15 mg Per Tube Q6H   pantoprazole (PROTONIX) IV  40 mg Intravenous Q12H   potassium chloride  40 mEq Per Tube BID   QUEtiapine  25 mg Per Tube QHS   rosuvastatin  10 mg Per Tube Daily   sodium chloride flush  10-40 mL Intracatheter Q12H   sodium chloride flush  10-40 mL Intracatheter Q12H   sodium chloride flush  3 mL Intravenous Q12H   Continuous Infusions:  sodium chloride Stopped (06/01/21 1121)  sodium chloride Stopped (06/10/21 1705)   sodium chloride 10 mL/hr at 06/11/21 1816   albumin human 12.5 g (06/10/21 2101)   albumin human 12.5 g (06/13/21 1825)   amiodarone 60 mg/hr (06/15/21 0700)   bivalirudin (ANGIOMAX) infusion 0.5 mg/mL (Non-ACS indications) 0.075 mg/kg/hr (06/15/21 0700)   ceFEPime (MAXIPIME) IV Stopped (06/15/21 0540)   feeding supplement (VITAL 1.5 CAL) 60 mL/hr at 06/15/21 0700   fentaNYL infusion INTRAVENOUS 75 mcg/hr (06/15/21 0700)   furosemide (LASIX) 200 mg in dextrose 5%  100 mL (65m/mL) infusion 4 mg/hr (06/15/21 0700)   vancomycin Stopped (06/15/21 0035)   PRN Meds: Place/Maintain arterial line **AND** sodium chloride, sodium chloride, sodium chloride, acetaminophen (TYLENOL) oral liquid 160 mg/5 mL, albumin human, albumin human, diphenhydrAMINE, docusate, fentaNYL (SUBLIMAZE) injection, guaiFENesin, hydrALAZINE, levalbuterol, lip balm, ondansetron (ZOFRAN) IV, polyvinyl alcohol, sennosides, sodium chloride flush, white petrolatum   Vital Signs    Vitals:   06/15/21 0400 06/15/21 0500 06/15/21 0600 06/15/21 0700  BP: (!) 175/75  (!) 141/67 (!) 181/87  Pulse: 81 77 81 80  Resp: (!) 0 (!) 0 (!) 0 10  Temp:      TempSrc:      SpO2: (!) 89% (!) 89% 95% 95%  Weight:  82.2 kg    Height:        Intake/Output Summary (Last 24 hours) at 06/15/2021 0743 Last data filed at 06/15/2021 0700 Gross per 24 hour  Intake 3807.15 ml  Output 4185 ml  Net -377.85 ml   Last 3 Weights 06/15/2021 06/14/2021 06/13/2021  Weight (lbs) 181 lb 3.5 oz 180 lb 12.4 oz 177 lb 0.5 oz  Weight (kg) 82.2 kg 82 kg 80.3 kg      Telemetry    NSR 80s personally reviewed.   Physical Exam   General: Sedated on vent Neck: RIJ crescent cannula, no thyromegaly or thyroid nodule.  Lungs: Clear to auscultation bilaterally with normal respiratory effort. CV: Nondisplaced PMI.  Heart regular S1/S2, no S3/S4, no murmur.  1+ ankle edema.  Abdomen: Soft, nontender, no hepatosplenomegaly, no distention.  Skin: Intact without lesions or rashes.  Neurologic: Wakes up but currently sedated.  Extremities: No clubbing or cyanosis.  HEENT: Normal.    Labs    High Sensitivity Troponin:   Recent Labs  Lab 06/05/21 2208 06/06/21 0037 06/06/21 0532  TROPONINIHS 211* 226* 196*      Chemistry Recent Labs  Lab 06/10/21 0233 06/10/21 0455 06/11/21 0402 06/11/21 0406 06/13/21 0220 06/13/21 0311 06/13/21 1620 06/13/21 1628 06/14/21 0217 06/14/21 0306 06/14/21 1600 06/14/21 1612  06/14/21 2348 06/15/21 0210 06/15/21 0525  NA 148*   < > 146*   < > 142   < > 143   < > 143   < > 143   < > 144 143 144  K 4.1   < > 4.3   < > 3.8   < > 4.0   < > 4.0   < > 4.5   < > 3.9 3.7 4.4  CL 103   < > 103   < > 101  --  102  --  106  --  106  --   --  105  --   CO2 36*   < > 36*   < > 32  --  31  --  29  --  28  --   --  29  --   GLUCOSE 135*   < > 145*   < > 144*  --  169*  --  139*  --  131*  --   --  139*  --   BUN 50*   < > 48*   < > 44*  --  45*  --  44*  --  44*  --   --  47*  --   CREATININE 1.01   < > 0.96   < > 0.94  --  0.85  --  0.86  --  0.79  --   --  0.81  --   CALCIUM 8.1*   < > 8.3*   < > 8.4*  --  8.4*  --  8.2*  --  8.2*  --   --  7.9*  --   MG 2.0  --  2.2  --   --   --  2.4  --   --   --   --   --   --   --   --   PROT 4.8*  --  4.8*   < > 5.0*  --   --   --  4.5*  --   --   --   --  4.7*  --   ALBUMIN 2.9*  --  3.0*   < > 3.1*  --   --   --  2.8*  --   --   --   --  2.5*  --   AST 31  --  26   < > 24  --   --   --  20  --   --   --   --  21  --   ALT 70*  --  60*   < > 50*  --   --   --  38  --   --   --   --  38  --   ALKPHOS 78  --  77   < > 86  --   --   --  74  --   --   --   --  90  --   BILITOT 0.5  --  0.6   < > 0.8  --   --   --  0.8  --   --   --   --  0.7  --   GFRNONAA >60   < > >60   < > >60  --  >60  --  >60  --  >60  --   --  >60  --   ANIONGAP 9   < > 7   < > 9  --  10  --  8  --  9  --   --  9  --    < > = values in this interval not displayed.    Lipids  No results for input(s): CHOL, TRIG, HDL, LABVLDL, LDLCALC, CHOLHDL in the last 168 hours.   Hematology Recent Labs  Lab 06/14/21 0217 06/14/21 0306 06/14/21 1600 06/14/21 1612 06/14/21 2348 06/15/21 0210 06/15/21 0525  WBC 9.2  --  8.4  --   --  9.5  --   RBC 3.36*  --  3.32*  --   --  3.42*  --   HGB 10.2*   < > 9.9*   < > 9.2* 10.1* 10.2*  HCT 31.3*   < > 32.3*   < > 27.0* 32.8* 30.0*  MCV 93.2  --  97.3  --   --  95.9  --   MCH 30.4  --  29.8  --   --  29.5  --   MCHC 32.6   --  30.7  --   --  30.8  --   RDW 17.7*  --  17.3*  --   --  17.2*  --   PLT 74*  --  76*  --   --  78*  --    < > = values in this interval not displayed.   Thyroid No results for input(s): TSH, FREET4 in the last 168 hours.  BNPNo results for input(s): BNP, PROBNP in the last 168 hours.  DDimer No results for input(s): DDIMER in the last 168 hours.   Radiology    DG CHEST PORT 1 VIEW  Result Date: 06/14/2021 CLINICAL DATA:  Follow-up ventilator support EXAM: PORTABLE CHEST 1 VIEW COMPARISON:  Earlier same day FINDINGS: Tracheostomy remains in place. Nasogastric tube tip just within the stomach. Soft feeding tube extends farther into the intestine, tip not visualized. Right arm PICC tip in the SVC above the right atrium. ECMO device appears unchanged. Widespread pulmonary opacity persists and could questionably be worsened. No new finding is seen. IMPRESSION: Lines and tubes are unchanged. Diffuse pulmonary opacity, questionably slightly worsened since earlier today. Electronically Signed   By: Nelson Chimes M.D.   On: 06/14/2021 23:40   DG CHEST PORT 1 VIEW  Result Date: 06/14/2021 CLINICAL DATA:  Personal HX of ECMO.  Follow-up study. EXAM: PORTABLE CHEST 1 VIEW COMPARISON:  06/13/2021 and older exams. FINDINGS: Extensive bilateral airspace lung opacities are without significant change. No pneumothorax. Tracheostomy tube, enteric/nasal/orogastric tubes, right-sided PICC and ECMO catheters are stable. IMPRESSION: 1. No significant change from the previous day's study. 2. Persistent and extensive bilateral airspace lung opacities. No pneumothorax. 3. Stable support apparatus. Electronically Signed   By: Lajean Manes M.D.   On: 06/14/2021 09:00    Cardiac Studies   Cath 05/17/2021 Distal left main [place] 111 bifurcation stenosis with 75% left main, 90% ostial to proximal LAD, and 80-90% ostial circumflex (difficult to assess due to heavy calcification). Severe mid circumflex disease with 70%  eccentric mid stenosis and second obtuse marginal containing ostial to proximal greater than 80% stenosis.  (Bifurcation [place] 111 Severe calcification in left main and LAD in particular with diffuse 50% narrowing from proximal to mid vessel and tandem 70% stenoses in the mid LAD. Nondominant right coronary Normal LV function.  EF 55%.  LVEDP normal.    Patient Profile     71 y.o. male with PMH of PVCs presented with chest pain. Cardiac cath by Dr. Tamala Julian on 05/17/2021 showed 75% left main, 90% ost to prox LAD, 80-90% ost LCx, 70% mid LCx, 80% OM2, 50% prox to mid LAD, 70% mid LAD, EF 55%. Patient underwent CABG x 3 on 05/16/2021. Post op course complicated afib, treated with amio. CXR showed opacity in bilateral lung, started abx on 1/25 and diuretic. Started on Eliquis due to recurrence of afib.    Assessment & Plan    1. CAD: Admitted with unstable angina, cath with severe left main and proximal LAD/LCx disease (nondominant RCA).  CABG x 3 on 1/18 with LIMA-LAD, SVG-OM, SVG-left PDA.  No s/s angina - Continue ASA 81, statin.  2. Acute HF with mid range EF:  Echo on 1/30 with EF 45-50%, clear respirophasic variation of the interventricular septum and marked respirophasic variation of E inflow velocity on doppler evaluation of the mitral valve; small to moderate pericardial effusion with pericardial thickening, concerning for effusive/constrictive  pericarditis (not consistent with tamponade with more organized pericardium but probably similar hemodynamics). RHC 1/30 with equalization of diastolic pressures.  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  Repeated echo 2/2 still showed respirophasic septal variation but not as impressive. CVP 6 today.  Lungs still with diffuse bilateral infiltrates on CXR.  Back on Lasix gtt 4 mg/hr, no chugging.  - Continue Lasix 4 mg/hr to keep I/Os even to mildly negative.  - With concern for development of post-surgical  effusive/constrictive pericarditis, he is on colchicine.  - ?need for surgical intervention on effusive/constrictive pericarditis. Will need improvement of lung disease first then reassess, repeat echo not as impressive.   3. 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  Suspect primarily septic/vasodilatory shock. He is now off pressors.  4. Hiccups: Intractable initially, now improved.  - On baclofen and gabapentin.  5. PNA with acute hypoxic respiratory failure: Of note, he does appear to have had some pre-existing ILD from 2019 CT chest (?sarcoidosis). CXR with persistent bilateral infiltrates, possible mild improvement compared to yesterday.  Have thought most likely aspiration PNA/pneumonitis in setting of intractable hiccups. He has developed ARDS. COVID was negative.  FiO2 0.5 on vent.  Has tracheostomy. CT chest 2/9 with persistent lung infiltrates.  Completed vancomycin and meropenem. Suspect ongoing aspiration with TFs noted in trach on 2/16, now with NGT to suction and post-pyloric feeding tube.  LDH stable today. Stable ECMO circuit.  FiO2 0.5.  CXR with concerns for developing honeycombing/fibrosis in LUL. Now with purulent secretions around trach. Abx restarted 2/25 (vancomycin/cefepime). Sedated on Fentanyl, Vt only 65 cc.   - Vent and sedation per CCM.  - Completed steroids.  - Back on amiodarone but suspect not amiodarone lung toxicity.   - Abx restarted (vanc/cefepime) 2/25. Cultures NGTD.   - Repeat CT chest today to look for any improvement => if no improvement and significant fibrosis, consider comfort measures.  If some improvement, can consider setting time limit of 3-4 more days.   6. Atypical atrial flutter/PVCs:  DCCV 2/17.  NSR this morning.   - Continue IV amiodarone, decrease back to 30 mg/hr.  - Bivalirudin gtt, goal PTT 50-80.  7. AKI: Creatinine stable. Follow closely.  8. Elevated LFTs: Suspect shock liver, follow CMET.  LFTs have  trended down.  9. Anemia: Hgb stable today, transfuse < 8.     10. Hypernatremia: Na 144 today.  - Continue free water 200 q4.  11. HTN: BP rises with sedation wean.  - prn hydralazine for now.  12. Bradycardia/asystole: Vagally mediated with cough.  Resolves rapidly with resolution of cough.  - Atropine at bedside.  13. FEN: TFs restarted with post-pyloric tube and also NGT to suction to prevent aspiration.   - Continue Reglan.   14. Physical deconditioning, severe - Mobilize as able.  15. GERD: Severe.  - continue PPI and aspiration precautions 16. Thrombocytopenia: Due to critical illness.  Plts 78K today.    CRITICAL CARE Performed by: Loralie Champagne  Total critical care time: 40 minutes  Critical care time was exclusive of separately billable procedures and treating other patients.  Critical care was necessary to treat or prevent imminent or life-threatening deterioration.  Critical care was time spent personally by me on the following activities: development of treatment plan with patient and/or surrogate as well as nursing, discussions with consultants, evaluation of patient's response to treatment, examination of patient, obtaining history from patient or surrogate, ordering and performing treatments and interventions,  ordering and review of laboratory studies, ordering and review of radiographic studies, pulse oximetry and re-evaluation of patient's condition.  Loralie Champagne MD 06/15/2021 7:43 AM

## 2021-06-15 NOTE — Progress Notes (Signed)
PT Cancellation Note  Patient Details Name: Randall Hodges MRN: [id number] DOB: 03/23/1951   Cancelled Treatment:    Reason Eval/Treat Not Completed: Patient not medically ready Pt back on sedation for CT chest today.  Wyona Almas, PT, DPT Acute Rehabilitation Services Pager [phone] Office [phone]    Deno Etienne 06/15/2021, 2:51 PM

## 2021-06-15 NOTE — Progress Notes (Signed)
Dr Carlis Abbott made aware of increased chugging and flow issues on the Cardiohelp decision was made to hold Lasix gtt at this time. Also, MD aware of dampened a-line will continue to utilize for ABG and lab draw but treat based off BP cuff pressures.

## 2021-06-15 NOTE — Plan of Care (Signed)
  Problem: Clinical Measurements: Goal: Ability to maintain clinical measurements within normal limits will improve Outcome: Progressing Goal: Will remain free from infection Outcome: Progressing Goal: Diagnostic test results will improve Outcome: Progressing Goal: Respiratory complications will improve Outcome: Progressing Goal: Cardiovascular complication will be avoided Outcome: Progressing   Problem: Nutrition: Goal: Adequate nutrition will be maintained Outcome: Progressing   Problem: Elimination: Goal: Will not experience complications related to bowel motility Outcome: Progressing Goal: Will not experience complications related to urinary retention Outcome: Progressing   Problem: Pain Managment: Goal: General experience of comfort will improve Outcome: Progressing   

## 2021-06-16 ENCOUNTER — Inpatient Hospital Stay (HOSPITAL_COMMUNITY): Payer: PPO

## 2021-06-16 DIAGNOSIS — J9601 Acute respiratory failure with hypoxia: Secondary | ICD-10-CM | POA: Diagnosis not present

## 2021-06-16 LAB — POCT I-STAT 7, (LYTES, BLD GAS, ICA,H+H)
Acid-Base Excess: 4 mmol/L — ABNORMAL HIGH (ref 0.0–2.0)
Acid-Base Excess: 1 mmol/L (ref 0.0–2.0)
Acid-Base Excess: 0 mmol/L (ref 0.0–2.0)
Acid-Base Excess: 1 mmol/L (ref 0.0–2.0)
Acid-base deficit: 1 mmol/L (ref 0.0–2.0)
Bicarbonate: 24.6 mmol/L (ref 20.0–28.0)
Bicarbonate: 25.7 mmol/L (ref 20.0–28.0)
Bicarbonate: 25.9 mmol/L (ref 20.0–28.0)
Bicarbonate: 26.6 mmol/L (ref 20.0–28.0)
Bicarbonate: 28.2 mmol/L — ABNORMAL HIGH (ref 20.0–28.0)
Calcium, Ion: 1.22 mmol/L (ref 1.15–1.40)
Calcium, Ion: 1.23 mmol/L (ref 1.15–1.40)
Calcium, Ion: 1.24 mmol/L (ref 1.15–1.40)
Calcium, Ion: 1.26 mmol/L (ref 1.15–1.40)
Calcium, Ion: 1.27 mmol/L (ref 1.15–1.40)
HCT: 25 % — ABNORMAL LOW (ref 39.0–52.0)
HCT: 25 % — ABNORMAL LOW (ref 39.0–52.0)
HCT: 25 % — ABNORMAL LOW (ref 39.0–52.0)
HCT: 27 % — ABNORMAL LOW (ref 39.0–52.0)
HCT: 31 % — ABNORMAL LOW (ref 39.0–52.0)
Hemoglobin: 10.5 g/dL — ABNORMAL LOW (ref 13.0–17.0)
Hemoglobin: 8.5 g/dL — ABNORMAL LOW (ref 13.0–17.0)
Hemoglobin: 8.5 g/dL — ABNORMAL LOW (ref 13.0–17.0)
Hemoglobin: 8.5 g/dL — ABNORMAL LOW (ref 13.0–17.0)
Hemoglobin: 9.2 g/dL — ABNORMAL LOW (ref 13.0–17.0)
O2 Saturation: 79 %
O2 Saturation: 87 %
O2 Saturation: 87 %
O2 Saturation: 88 %
O2 Saturation: 90 %
Patient temperature: 36.7
Patient temperature: 36.8
Patient temperature: 36.8
Patient temperature: 36.9
Patient temperature: 37.1
Potassium: 4 mmol/L (ref 3.5–5.1)
Potassium: 4 mmol/L (ref 3.5–5.1)
Potassium: 4 mmol/L (ref 3.5–5.1)
Potassium: 4.6 mmol/L (ref 3.5–5.1)
Potassium: 4.8 mmol/L (ref 3.5–5.1)
Sodium: 144 mmol/L (ref 135–145)
Sodium: 145 mmol/L (ref 135–145)
Sodium: 145 mmol/L (ref 135–145)
Sodium: 145 mmol/L (ref 135–145)
Sodium: 146 mmol/L — ABNORMAL HIGH (ref 135–145)
TCO2: 26 mmol/L (ref 22–32)
TCO2: 27 mmol/L (ref 22–32)
TCO2: 27 mmol/L (ref 22–32)
TCO2: 28 mmol/L (ref 22–32)
TCO2: 29 mmol/L (ref 22–32)
pCO2 arterial: 40 mmHg (ref 32–48)
pCO2 arterial: 42 mmHg (ref 32–48)
pCO2 arterial: 43.5 mmHg (ref 32–48)
pCO2 arterial: 43.8 mmHg (ref 32–48)
pCO2 arterial: 46.7 mmHg (ref 32–48)
pH, Arterial: 7.357 (ref 7.35–7.45)
pH, Arterial: 7.363 (ref 7.35–7.45)
pH, Arterial: 7.379 (ref 7.35–7.45)
pH, Arterial: 7.418 (ref 7.35–7.45)
pH, Arterial: 7.435 (ref 7.35–7.45)
pO2, Arterial: 45 mmHg — ABNORMAL LOW (ref 83–108)
pO2, Arterial: 52 mmHg — ABNORMAL LOW (ref 83–108)
pO2, Arterial: 54 mmHg — ABNORMAL LOW (ref 83–108)
pO2, Arterial: 57 mmHg — ABNORMAL LOW (ref 83–108)
pO2, Arterial: 58 mmHg — ABNORMAL LOW (ref 83–108)

## 2021-06-16 LAB — GLUCOSE, CAPILLARY
Glucose-Capillary: 112 mg/dL — ABNORMAL HIGH (ref 70–99)
Glucose-Capillary: 127 mg/dL — ABNORMAL HIGH (ref 70–99)
Glucose-Capillary: 132 mg/dL — ABNORMAL HIGH (ref 70–99)
Glucose-Capillary: 138 mg/dL — ABNORMAL HIGH (ref 70–99)
Glucose-Capillary: 150 mg/dL — ABNORMAL HIGH (ref 70–99)
Glucose-Capillary: 162 mg/dL — ABNORMAL HIGH (ref 70–99)

## 2021-06-16 LAB — HEPATIC FUNCTION PANEL
ALT: 33 U/L (ref 0–44)
AST: 20 U/L (ref 15–41)
Albumin: 2.5 g/dL — ABNORMAL LOW (ref 3.5–5.0)
Alkaline Phosphatase: 88 U/L (ref 38–126)
Bilirubin, Direct: 0.1 mg/dL (ref 0.0–0.2)
Indirect Bilirubin: 0.6 mg/dL (ref 0.3–0.9)
Total Bilirubin: 0.7 mg/dL (ref 0.3–1.2)
Total Protein: 4.4 g/dL — ABNORMAL LOW (ref 6.5–8.1)

## 2021-06-16 LAB — BASIC METABOLIC PANEL
Anion gap: 10 (ref 5–15)
Anion gap: 6 (ref 5–15)
BUN: 56 mg/dL — ABNORMAL HIGH (ref 8–23)
BUN: 48 mg/dL — ABNORMAL HIGH (ref 8–23)
CO2: 27 mmol/L (ref 22–32)
CO2: 26 mmol/L (ref 22–32)
Calcium: 8 mg/dL — ABNORMAL LOW (ref 8.9–10.3)
Calcium: 8 mg/dL — ABNORMAL LOW (ref 8.9–10.3)
Chloride: 107 mmol/L (ref 98–111)
Chloride: 111 mmol/L (ref 98–111)
Creatinine, Ser: 1.14 mg/dL (ref 0.61–1.24)
Creatinine, Ser: 0.96 mg/dL (ref 0.61–1.24)
GFR, Estimated: >60 mL/min (ref >60)
GFR, Estimated: >60 mL/min (ref >60)
Glucose, Bld: 142 mg/dL — ABNORMAL HIGH (ref 70–99)
Glucose, Bld: 161 mg/dL — ABNORMAL HIGH (ref 70–99)
Potassium: 3.9 mmol/L (ref 3.5–5.1)
Potassium: 4.1 mmol/L (ref 3.5–5.1)
Sodium: 144 mmol/L (ref 135–145)
Sodium: 143 mmol/L (ref 135–145)

## 2021-06-16 LAB — CBC
HCT: 27.7 % — ABNORMAL LOW (ref 39.0–52.0)
HCT: 28.9 % — ABNORMAL LOW (ref 39.0–52.0)
Hemoglobin: 8.9 g/dL — ABNORMAL LOW (ref 13.0–17.0)
Hemoglobin: 9 g/dL — ABNORMAL LOW (ref 13.0–17.0)
MCH: 30.7 pg (ref 26.0–34.0)
MCH: 30.2 pg (ref 26.0–34.0)
MCHC: 32.1 g/dL (ref 30.0–36.0)
MCHC: 31.1 g/dL (ref 30.0–36.0)
MCV: 95.5 fL (ref 80.0–100.0)
MCV: 97 fL (ref 80.0–100.0)
Platelets: 67 10*3/uL — ABNORMAL LOW (ref 150–400)
Platelets: 79 10*3/uL — ABNORMAL LOW (ref 150–400)
RBC: 2.9 MIL/uL — ABNORMAL LOW (ref 4.22–5.81)
RBC: 2.98 MIL/uL — ABNORMAL LOW (ref 4.22–5.81)
RDW: 17.2 % — ABNORMAL HIGH (ref 11.5–15.5)
RDW: 17.3 % — ABNORMAL HIGH (ref 11.5–15.5)
WBC: 9.3 10*3/uL (ref 4.0–10.5)
WBC: 11.2 10*3/uL — ABNORMAL HIGH (ref 4.0–10.5)
nRBC: 0 % (ref 0.0–0.2)
nRBC: 0 % (ref 0.0–0.2)

## 2021-06-16 LAB — LACTATE DEHYDROGENASE: LDH: 173 U/L (ref 98–192)

## 2021-06-16 LAB — HEMOGLOBIN AND HEMATOCRIT, BLOOD
HCT: 26.1 % — ABNORMAL LOW (ref 39.0–52.0)
Hemoglobin: 8 g/dL — ABNORMAL LOW (ref 13.0–17.0)

## 2021-06-16 LAB — POTASSIUM: Potassium: 4.2 mmol/L (ref 3.5–5.1)

## 2021-06-16 LAB — APTT
aPTT: 71 seconds — ABNORMAL HIGH (ref 24–36)
aPTT: 76 seconds — ABNORMAL HIGH (ref 24–36)

## 2021-06-16 LAB — FIBRINOGEN: Fibrinogen: 445 mg/dL (ref 210–475)

## 2021-06-16 LAB — LACTIC ACID, PLASMA: Lactic Acid, Venous: 1.1 mmol/L (ref 0.5–1.9)

## 2021-06-16 MED ORDER — FUROSEMIDE 10 MG/ML IJ SOLN: 4.0000 mg/h | INTRAVENOUS | Status: DC

## 2021-06-16 NOTE — Progress Notes (Signed)
NAME:  Randall Hodges, MRN:  [id number], DOB:  1950-12-20, LOS: 46 ADMISSION DATE:  05/15/2021, CONSULTATION DATE: 05/07/2021 REFERRING MD:  Dahlia Byes, MD, CHIEF COMPLAINT: Increasing shortness of breath  History of Present Illness:  71 year old male with coronary artery disease who initially presented with chest pain, noted to have multivessel coronary artery disease, he underwent CABG x3 on 04/1818 23, course was complicated with atrial fibrillation, frequent hiccups and aspiration pneumonia leading to hypoxia and increasing oxygen requirement. PCCM was consulted for evaluation and help with management  Patient stated hiccups are better but continued complain of shortness of breath, cough unable to bring up phlegm.  Pertinent  Medical History  CAD Allergic rhinitis Prostate cancer GERD Pulmonary fibrosis   Significant Hospital Events: Including procedures, antibiotic start and stop dates in addition to other pertinent events   1/18 CABG 1/30 PCCM consult, Worsening O2 needs, likely aspiration, new pressor need 2/2 intubation, placed on VV ECMO 2/5 issues with recirc, bronch neg, improved with supine positioning 2/7 percutaneous tracheostomy 2/10 diuresis and weaning sedation. CT chest shows diffuse ground-glass opacification.  2/11 weaning sedation 2/13 increasing air hunger as oxygenator efficiency decreasing.  2/14 oxygenator changed.  Still having air hunger but able to follow commands and participate with therapy.  2/15 considerable distress with double stacking. Switched to true rest settings - with improvement in distress.  2/16 bronch, NGT placed to LIWS, con't post-pyloric TF 2/17 DCCV overnight for Afib with RVR. Stood at bedside with PT. 2/22 developed subcutaneous air following attempt at lung recruitment. 2/23 back on full rest ventilator settings with minimal spontaneous tidal volume.  Interim History / Subjective:  Mild chugging overnight. Some coughing last  evening but didn't need PRN benzos.  Objective   Blood pressure 128/62, pulse 81, temperature 98.8 F (37.1 C), temperature source Core, resp. rate (!) 0, height 5\' 10"  (1.778 m), weight 92 kg, SpO2 90 %. CVP:  [4 mmHg-16 mmHg] 7 mmHg  Vent Mode: PCV FiO2 (%):  [50 %] 50 % Set Rate:  [10 bmp] 10 bmp PEEP:  [10 cmH20] 10 cmH20 Plateau Pressure:  [18 cmH20-19 cmH20] 18 cmH20   Intake/Output Summary (Last 24 hours) at 06/16/2021 0710 Last data filed at 06/16/2021 0700 Gross per 24 hour  Intake 5050.93 ml  Output 2655 ml  Net 2395.93 ml    Filed Weights   06/14/21 0500 06/15/21 0500 06/16/21 0500  Weight: 82 kg 82.2 kg 92 kg   Examination: General: critically ill appearing man on MV & ECMO HENT: [place]/AT, eyes anicteric Neck: RIJ ECMO cannula, trach  Lungs: minimal breath sounds, Vt ~65cc Cardiovascular:  S1S2, RRR Abdomen:  NGT to suction with bilious output, hypoactive bowel sounds, TTP in abdomen. Extremities: pitting edema, Una boots. No cyanosis. Neuro: RASS -2, moving during exam. GU: foley with light yellow urine  vent 01/26/09/50% with Vt ~65cc ECMO 4.8L, 3450 RPM, Sweep 7L  7.44/42/58/28 BG  80-130s BUN 56 Cr 1.14 LDH 173 H/H 8.9/27.7 Platelets 67 LA 1.1 PTT 71 CXR personally reviewed> ongoing dense consolidation bilaterally 2/25 resp culture> normal flora Blood cultures 2/25> NGTD  Resolved problem list:  hypernatremia  Assessment & Plan:   Acute hypoxic and hypercapnic respiratory failure ARDS Aspiration pneumonitis and pneumonia- normal flora Mild baseline subpleural fibrosis- question if this could be chronic aspiration related. Most recent CT abnormalities are acute airspace disease with development of underlying fibrotic changes. -Con't VV ECMO-- family planning on coming today to make a decision about how to proceed  after ECMO decannulation. They have requested more time, so we will revisit tomorrow. They want to ensure he is comfortable. -VAP  prevention protocol -PAD protocol. Want to ensure he is comfortable. Fentanyl paused today so they can see him more awake.  -routine trach care -resume lasix for diuresis -con't bival for circuit AC  Hiccups, recurrent aspiration episodes. Concern for chronic aspiration. -Con't reglan and NGT to LIWS. -PPI BID -con't baclofen -TF held for possible decannulation today; ok to resume  CAD with UA s/p CABG x 3, (LIMA-LAD, SVG-OM, SVG-left PDA) NSVT Afib with RVR, DCCV, back in NSR -ASA, statin -amiodarone -hold Bblocker due to bradycardic episodes -tele monitoring  Hypertension -hydralazine PRN  Concern for recurrent infection, recultured 2/25 -vanc, cefepime -follow cultures until finalized  Hyperglycemia, not needing insulin -SSI PRN; goal BG <180  Thrombocytopenia, likely consumption in ECMO circuit -monitor, no indication for transfusion  Acute anemia, expected post-op blood loss, consumption from ECMO circuit -transfuse for Hb <8; at this point if we are decannulating tomorrow would be cautions about additional transfusions given concerns about volume. -monitor  Acute metabolic encephalopathy, ICU delirium -Con't enteral meds- morphine, seroquel, clonazepam. -fentanyl PRN for comfort, vent tolerance -frequent reorientation, family visitation, mobility -pain control  Deconditioning  Updated wife at bedside. She wants another day on ECMO to help make decisions. Both children are coming to visit today.  Best Practice (right click and "Reselect all SmartList Selections" daily)   Diet/type: TF DVT prophylaxis: Bival CBG: SSI GI prophylaxis: PPI Lines: L [place] TLC, RIJ  Foley:  Yes Code Status:  full code Last date of multidisciplinary goals of care discussion [ 2/28]   This patient is critically ill with multiple organ system failure which requires frequent high complexity decision making, assessment, support, evaluation, and titration of therapies. This was  completed through the application of advanced monitoring technologies and extensive interpretation of multiple databases. During this encounter critical care time was devoted to patient care services described in this note for 55 minutes.  Julian Hy, DO 06/16/21 2:13 PM [facility]

## 2021-06-16 NOTE — Progress Notes (Signed)
Patient ID: Randall Hodges, male   DOB: 1951-01-09, 71 y.o.   MRN: [id number]    Progress Note  Patient Name: Randall Hodges Date of Encounter: 06/16/2021  [facility] Cardiologist: [name], MD   Subjective   2/2: VV ECMO initiation, Crescent catheter right IJ 2/7: Tracheostomy 2/13 CT C/A/P: Diffuse ground-glass opacities consistent with atelectasis and edema presumably related to ECMO physiology and respiratory failure.  Underlying moderate interstitial lung disease mildly progressed from prior CT. 2/15: Oxygenator changed, trach changed.  2/16: Transient asystole with cough and adjustment of tracheostomy overnight.  2/17: Atrial flutter with RVR, required emergent DCCV and amiodarone restarted 2/23: Pneumomediastinum with high PEEP, PEEP decreased 2/27: CT chest with worsening lungs, nearly airless with progressive fibrosis, mild pneumomediastinum, moderate-large bilateral effusions.   Remains on vent  FiO2 0.5. VT 65 cc, sedated again with Fentanyl.    On amiodarone 30 mg/hr with frequent PVCs, no PVCs currently.    Lasix gtt turned off with chugging, CVP 12 with weight up significantly.   Still with bilious drainage from NGT, to suction.   He is back on vancomycin/cefepime  ECMO: Speed 3450 rpm Flow 4.7 L/min pVen -83 DeltaP 26 Sweep 6.5 PTT 71 (goal 50-80) LDH 173 ABG 7.43/42/58/90%  Cardiac Studies: Echo (limited, 1/30): Echo reviewed, there is clear respirophasic variation of the interventricular septum and marked respirophasic variation of E inflow velocity on doppler evaluation of the mitral valve.  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  LV EF 45-50%.   RHC Procedural Findings (on norepinephrine 6): Hemodynamics (mmHg) RA mean 12 RV 37/12 PA 38/16, mean  27 PCWP mean 11 LV 108/12 AO 96/55 PAPI 1.8 Oxygen saturations: PA 54% AO 94% Cardiac Output (Fick) 5.59  Cardiac Index (Fick) 2.81  PVR 2.8 WU Simultaneous RV/LV tracings were obtained.  Difficult to interpret due to atrial fibrillation.  There was some suggestion of discordance (ventricular interdependence) but not clear.  Inpatient Medications    Scheduled Meds:  aspirin  81 mg Per Tube Daily   baclofen  5 mg Per Tube TID   chlorhexidine gluconate (MEDLINE KIT)  15 mL Mouth Rinse BID   Chlorhexidine Gluconate Cloth  6 each Topical Daily   clonazePAM  1 mg Per Tube TID   colchicine  0.6 mg Per Tube Daily   feeding supplement (PROSource TF)  90 mL Per Tube BID   fiber  1 packet Per Tube TID   free water  200 mL Per Tube Q4H   Gerhardt's butt cream   Topical TID   haloperidol lactate  5 mg Intravenous Once   insulin aspart  0-15 Units Subcutaneous Q4H   mouth rinse  15 mL Mouth Rinse 10 times per day   melatonin  5 mg Per Tube QHS   metoCLOPramide (REGLAN) injection  5 mg Intravenous Q8H   morphine  15 mg Per Tube Q6H   pantoprazole (PROTONIX) IV  40 mg Intravenous Q12H   potassium chloride  40 mEq Per Tube BID   QUEtiapine  25 mg Per Tube QHS   rosuvastatin  10 mg Per Tube Daily   sodium chloride flush  10-40 mL Intracatheter Q12H   sodium chloride flush  10-40 mL Intracatheter Q12H   sodium chloride flush  3 mL Intravenous Q12H   Continuous Infusions:  sodium chloride Stopped (06/01/21 1121)   sodium chloride  Stopped (06/10/21 1705)   sodium chloride 10 mL/hr at 06/11/21 1816   albumin human 12.5 g (06/10/21 2101)   albumin human Stopped (06/15/21 1900)   amiodarone 30 mg/hr (06/16/21 0700)   bivalirudin (ANGIOMAX) infusion 0.5 mg/mL (Non-ACS indications) 0.075 mg/kg/hr (06/16/21 0700)   ceFEPime (MAXIPIME) IV Stopped (06/16/21 0606)   feeding supplement (VITAL 1.5 CAL) 1,000 mL (06/16/21 0035)   fentaNYL infusion INTRAVENOUS 75 mcg/hr (06/16/21 0700)   furosemide  (LASIX) 200 mg in dextrose 5% 100 mL (76m/mL) infusion Stopped (06/15/21 1321)   furosemide (LASIX) 200 mg in dextrose 5% 100 mL (212mmL) infusion     vancomycin Stopped (06/16/21 0045)   PRN Meds: Place/Maintain arterial line **AND** sodium chloride, sodium chloride, sodium chloride, acetaminophen (TYLENOL) oral liquid 160 mg/5 mL, albumin human, albumin human, diphenhydrAMINE, docusate, fentaNYL (SUBLIMAZE) injection, guaiFENesin, hydrALAZINE, levalbuterol, lip balm, midazolam, ondansetron (ZOFRAN) IV, polyvinyl alcohol, sennosides, sodium chloride flush, white petrolatum   Vital Signs    Vitals:   06/16/21 0615 06/16/21 0630 06/16/21 0645 06/16/21 0700  BP: (!) 143/62 140/63 (!) 146/62 128/62  Pulse: 82 82 81 81  Resp: (!) 0 (!) 0 (!) 0 (!) 0  Temp:      TempSrc:      SpO2: (!) 89% 90% (!) 89% 90%  Weight:      Height:        Intake/Output Summary (Last 24 hours) at 06/16/2021 0744 Last data filed at 06/16/2021 0700 Gross per 24 hour  Intake 5050.93 ml  Output 2655 ml  Net 2395.93 ml   Last 3 Weights 06/16/2021 06/15/2021 06/14/2021  Weight (lbs) 202 lb 13.2 oz 181 lb 3.5 oz 180 lb 12.4 oz  Weight (kg) 92 kg 82.2 kg 82 kg      Telemetry    NSR 80s personally reviewed.   Physical Exam   General: Sedated on vent Neck: RIJ Crescent cannula, no thyromegaly or thyroid nodule.  Lungs: Decreased BS bilaterally CV: Nondisplaced PMI.  Heart regular S1/S2, no S3/S4, no murmur.  1+ edema to knees.  Abdomen: Soft, nontender, no hepatosplenomegaly, no distention.  Skin: Intact without lesions or rashes.  Neurologic: Wakes up with sedation wean.  Extremities: No clubbing or cyanosis.  HEENT: Normal.   Labs    High Sensitivity Troponin:   Recent Labs  Lab 06/05/21 2208 06/06/21 0037 06/06/21 0532  TROPONINIHS 211* 226* 196*      Chemistry Recent Labs  Lab 06/10/21 0233 06/10/21 0455 06/11/21 0402 06/11/21 0406 06/13/21 1620 06/13/21 1628 06/14/21 0217  06/14/21 0306 06/15/21 0210 06/15/21 0525 06/15/21 1609 06/15/21 1616 06/15/21 2004 06/15/21 2344 06/16/21 0408 06/16/21 0411  NA 148*   < > 146*   < > 143   < > 143   < > 143   < > 143   < > 144  --  144 145  K 4.1   < > 4.3   < > 4.0   < > 4.0   < > 3.7   < > 4.2   < > 4.0 4.2 3.9 4.0  CL 103   < > 103   < > 102  --  106   < > 105  --  105  --   --   --  107  --   CO2 36*   < > 36*   < > 31  --  29   < > 29  --  28  --   --   --  27  --   GLUCOSE 135*   < > 145*   < > 169*  --  139*   < > 139*  --  141*  --   --   --  142*  --   BUN 50*   < > 48*   < > 45*  --  44*   < > 47*  --  49*  --   --   --  56*  --   CREATININE 1.01   < > 0.96   < > 0.85  --  0.86   < > 0.81  --  1.00  --   --   --  1.14  --   CALCIUM 8.1*   < > 8.3*   < > 8.4*  --  8.2*   < > 7.9*  --  8.0*  --   --   --  8.0*  --   MG 2.0  --  2.2  --  2.4  --   --   --   --   --   --   --   --   --   --   --   PROT 4.8*  --  4.8*   < >  --   --  4.5*  --  4.7*  --   --   --   --   --  4.4*  --   ALBUMIN 2.9*  --  3.0*   < >  --   --  2.8*  --  2.5*  --   --   --   --   --  2.5*  --   AST 31  --  26   < >  --   --  20  --  21  --   --   --   --   --  20  --   ALT 70*  --  60*   < >  --   --  38  --  38  --   --   --   --   --  33  --   ALKPHOS 78  --  77   < >  --   --  74  --  90  --   --   --   --   --  88  --   BILITOT 0.5  --  0.6   < >  --   --  0.8  --  0.7  --   --   --   --   --  0.7  --   GFRNONAA >60   < > >60   < > >60  --  >60   < > >60  --  >60  --   --   --  >60  --   ANIONGAP 9   < > 7   < > 10  --  8   < > 9  --  10  --   --   --  10  --    < > = values in this interval not displayed.    Lipids  No results for input(s): CHOL, TRIG, HDL, LABVLDL, LDLCALC, CHOLHDL in the last 168 hours.   Hematology Recent Labs  Lab 06/15/21 0210 06/15/21 0525 06/15/21 1609 06/15/21 1616 06/15/21 2344 06/16/21 0408 06/16/21 0411  WBC 9.5  --  7.6  --   --  9.3  --   RBC 3.42*  --  2.84*  --   --  2.90*  --   HGB  10.1*   < > 8.4*   < > 8.0* 8.9* 8.5*  HCT 32.8*   < > 27.4*   < > 26.1* 27.7* 25.0*  MCV 95.9  --  96.5  --   --  95.5  --   MCH 29.5  --  29.6  --   --  30.7  --   MCHC 30.8  --  30.7  --   --  32.1  --   RDW 17.2*  --  17.2*  --   --  17.2*  --   PLT 78*  --  66*  --   --  67*  --    < > = values in this interval not displayed.   Thyroid No results for input(s): TSH, FREET4 in the last 168 hours.  BNPNo results for input(s): BNP, PROBNP in the last 168 hours.  DDimer No results for input(s): DDIMER in the last 168 hours.   Radiology    CT ABDOMEN WO CONTRAST  Result Date: 06/15/2021 CLINICAL DATA:  Respiratory illness with nondiagnostic x-ray. EXAM: CT CHEST, ABDOMEN AND PELVIS WITHOUT CONTRAST TECHNIQUE: Multidetector CT imaging of the chest, abdomen and pelvis was performed following the standard protocol without IV contrast. RADIATION DOSE REDUCTION: This exam was performed according to the departmental dose-optimization program which includes automated exposure control, adjustment of the mA and/or kV according to patient size and/or use of iterative reconstruction technique. COMPARISON:  05/29/2021 FINDINGS: CT CHEST FINDINGS Cardiovascular: Normal heart size. Unchanged pericardial fluid and/or thickening. ECMO catheter traverses the right atrium. Right PICC with tip at the lower SVC. Tracheostomy tube in place. Enteric and feeding tubes with enteric tube tip at the stomach and feeding tube tip at the fourth portion duodenum. No acute vascular finding. Prior CABG. Mediastinum/Nodes: Small volume pneumomediastinum. No adenopathy or mass. Lungs/Pleura: Worsening aeration. Definite fibrotic features with traction bronchiectasis diffusely. The lower lobes are completely airless, it is unclear if this is generalized collapse from ECMO, change in ventilator status, or worsening acute lung injury. Increase in bilateral pleural effusion which is moderate to large, measuring up to 3 cm in thickness  bilaterally where dependent. Fluid stacks in the upper trachea above the tracheostomy cuff. Pulmonary interstitial emphysema seen affecting the left upper lobe. 2.6 cm ovoid nodule in the subpleural left lung at approximately the 7-8 intercostal space. Musculoskeletal: No acute finding CT ABDOMEN FINDINGS Hepatobiliary: No focal liver abnormality.No evidence of biliary obstruction or stone. Pancreas: Unremarkable. Spleen: Unremarkable. Adrenals/Urinary Tract: Negative adrenals. No hydronephrosis or ureteral stone. 4 mm right renal calculus. Unremarkable bladder. Stomach/Bowel: No obstruction. No visible bowel inflammation. Tubes as described above. Vascular/Lymphatic: Atheromatous calcification of the aorta. No mass or adenopathy. Other: Body wall edema. Musculoskeletal: No acute abnormalities. Remote compression fractures at L1 and L2. IMPRESSION: 1. Compared to 05/29/2021, worsening aeration with nearly airless lungs. Definite pulmonary fibrotic changes with diffuse traction bronchiectasis. Subpleural interstitial lung disease was seen on a 2019 chest CT but not to the extent noted presently. 2. Mild pneumomediastinum. Findings of barotrauma and pulmonary interstitial emphysema at the left upper lobe. 3. Progressive bilateral pleural effusion which are moderate to large. Subpleural nodule in the posterior left chest at the 7-8 and rib interspace, stable from prior. Has there been thoracentesis? 4. Unchanged pericardial fluid and/or thickening. 5. Unremarkable hardware. Electronically Signed   By: Jorje Guild M.D.   On: 06/15/2021 11:21   CT CHEST  WO CONTRAST  Result Date: 06/15/2021 CLINICAL DATA:  Respiratory illness with nondiagnostic x-ray. EXAM: CT CHEST, ABDOMEN AND PELVIS WITHOUT CONTRAST TECHNIQUE: Multidetector CT imaging of the chest, abdomen and pelvis was performed following the standard protocol without IV contrast. RADIATION DOSE REDUCTION: This exam was performed according to the departmental  dose-optimization program which includes automated exposure control, adjustment of the mA and/or kV according to patient size and/or use of iterative reconstruction technique. COMPARISON:  05/29/2021 FINDINGS: CT CHEST FINDINGS Cardiovascular: Normal heart size. Unchanged pericardial fluid and/or thickening. ECMO catheter traverses the right atrium. Right PICC with tip at the lower SVC. Tracheostomy tube in place. Enteric and feeding tubes with enteric tube tip at the stomach and feeding tube tip at the fourth portion duodenum. No acute vascular finding. Prior CABG. Mediastinum/Nodes: Small volume pneumomediastinum. No adenopathy or mass. Lungs/Pleura: Worsening aeration. Definite fibrotic features with traction bronchiectasis diffusely. The lower lobes are completely airless, it is unclear if this is generalized collapse from ECMO, change in ventilator status, or worsening acute lung injury. Increase in bilateral pleural effusion which is moderate to large, measuring up to 3 cm in thickness bilaterally where dependent. Fluid stacks in the upper trachea above the tracheostomy cuff. Pulmonary interstitial emphysema seen affecting the left upper lobe. 2.6 cm ovoid nodule in the subpleural left lung at approximately the 7-8 intercostal space. Musculoskeletal: No acute finding CT ABDOMEN FINDINGS Hepatobiliary: No focal liver abnormality.No evidence of biliary obstruction or stone. Pancreas: Unremarkable. Spleen: Unremarkable. Adrenals/Urinary Tract: Negative adrenals. No hydronephrosis or ureteral stone. 4 mm right renal calculus. Unremarkable bladder. Stomach/Bowel: No obstruction. No visible bowel inflammation. Tubes as described above. Vascular/Lymphatic: Atheromatous calcification of the aorta. No mass or adenopathy. Other: Body wall edema. Musculoskeletal: No acute abnormalities. Remote compression fractures at L1 and L2. IMPRESSION: 1. Compared to 05/29/2021, worsening aeration with nearly airless lungs. Definite  pulmonary fibrotic changes with diffuse traction bronchiectasis. Subpleural interstitial lung disease was seen on a 2019 chest CT but not to the extent noted presently. 2. Mild pneumomediastinum. Findings of barotrauma and pulmonary interstitial emphysema at the left upper lobe. 3. Progressive bilateral pleural effusion which are moderate to large. Subpleural nodule in the posterior left chest at the 7-8 and rib interspace, stable from prior. Has there been thoracentesis? 4. Unchanged pericardial fluid and/or thickening. 5. Unremarkable hardware. Electronically Signed   By: Jorje Guild M.D.   On: 06/15/2021 11:21   DG CHEST PORT 1 VIEW  Result Date: 06/14/2021 CLINICAL DATA:  Follow-up ventilator support EXAM: PORTABLE CHEST 1 VIEW COMPARISON:  Earlier same day FINDINGS: Tracheostomy remains in place. Nasogastric tube tip just within the stomach. Soft feeding tube extends farther into the intestine, tip not visualized. Right arm PICC tip in the SVC above the right atrium. ECMO device appears unchanged. Widespread pulmonary opacity persists and could questionably be worsened. No new finding is seen. IMPRESSION: Lines and tubes are unchanged. Diffuse pulmonary opacity, questionably slightly worsened since earlier today. Electronically Signed   By: Nelson Chimes M.D.   On: 06/14/2021 23:40    Cardiac Studies   Cath 05/15/2021 Distal left main [place] 111 bifurcation stenosis with 75% left main, 90% ostial to proximal LAD, and 80-90% ostial circumflex (difficult to assess due to heavy calcification). Severe mid circumflex disease with 70% eccentric mid stenosis and second obtuse marginal containing ostial to proximal greater than 80% stenosis.  (Bifurcation [place] 111 Severe calcification in left main and LAD in particular with diffuse 50% narrowing from proximal to mid vessel  and tandem 70% stenoses in the mid LAD. Nondominant right coronary Normal LV function.  EF 55%.  LVEDP normal.    Patient  Profile     71 y.o. male with PMH of PVCs presented with chest pain. Cardiac cath by Dr. Tamala Julian on 04/19/2021 showed 75% left main, 90% ost to prox LAD, 80-90% ost LCx, 70% mid LCx, 80% OM2, 50% prox to mid LAD, 70% mid LAD, EF 55%. Patient underwent CABG x 3 on 04/30/2021. Post op course complicated afib, treated with amio. CXR showed opacity in bilateral lung, started abx on 1/25 and diuretic. Started on Eliquis due to recurrence of afib.    Assessment & Plan    1. CAD: Admitted with unstable angina, cath with severe left main and proximal LAD/LCx disease (nondominant RCA).  CABG x 3 on 1/18 with LIMA-LAD, SVG-OM, SVG-left PDA.  No s/s angina - Continue ASA 81, statin.  2. 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 RHC 1/30 with equalization of diastolic pressures.  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  Repeated echo 2/2 still showed respirophasic septal variation but not as impressive. CVP 12 today with weight up off Lasix (turned off with chugging).  Lungs still with diffuse bilateral infiltrates on CXR.    - Restart Lasix gtt 4 mg/hr.   - With concern for development of post-surgical effusive/constrictive pericarditis, he is on colchicine.  - Initial question of need for surgical intervention on effusive/constrictive pericarditis, but repeat echo not as impressive.   3. 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  Suspect primarily septic/vasodilatory shock. He is now off pressors.  4. Hiccups: Intractable initially, now improved.  - On  baclofen and gabapentin.  5. PNA with acute hypoxic respiratory failure: Of note, he does appear to have had some pre-existing ILD from 2019 CT chest (?sarcoidosis). CXR with persistent bilateral infiltrates, possible mild improvement compared to yesterday.  Have thought most likely aspiration PNA/pneumonitis in setting of intractable hiccups. He has developed ARDS. COVID was negative.  FiO2 0.5 on vent.  Has tracheostomy. CT chest 2/9 with persistent lung infiltrates.  Completed vancomycin and meropenem. Suspect ongoing aspiration with TFs noted in trach on 2/16, now with NGT to suction and post-pyloric feeding tube.  LDH stable today. Stable ECMO circuit.  FiO2 0.5. Abx restarted 2/25 (vancomycin/cefepime). Sedated on Fentanyl, Vt only 65 cc.  CT chest yesterday showed worsening lungs, nearly airless with progressive fibrosis and mod-large pleural effusions.  - Pulmonary worsening despite 25 days now of ECMO support with progressive fibrosis.  Discussed today with multidisciplinary team and will discuss again with family, we are not making progress and will need to consider ECMO decannulation/comfort care.  Family may opt for decannulation of ECMO and continued aggressive attempt to ventilate, think this would be very difficult and would necessitate chest tubes given effusions.  - Vent and sedation per CCM.  - Completed steroids.  - Back on amiodarone but suspect not amiodarone lung toxicity.   - Abx restarted (vanc/cefepime) 2/25. Cultures NGTD.   6. Atypical atrial flutter/PVCs:  DCCV 2/17.  NSR this morning.   - Continue IV amiodarone 30 mg/hr.  - Bivalirudin gtt, goal PTT 50-80.  7. AKI: Creatinine mildly higher. Follow closely.  8. Elevated LFTs: Suspect shock liver, follow CMET.  LFTs have trended down.  9. Anemia: Hgb 8.9, transfuse < 8.     10. Hypernatremia: Na 144 today.  - Continue free water 200 q4.  11. HTN: BP rises with sedation wean.  - prn hydralazine for now.  12.  Bradycardia/asystole: Vagally mediated with cough.  Resolves rapidly with resolution of cough.  - Atropine at bedside.  13. FEN: TFs restarted with post-pyloric tube and also NGT to suction to prevent aspiration.   - Continue Reglan.   14. Physical deconditioning, severe - Mobilize as able.  15. GERD: Severe.  - continue PPI and aspiration precautions 16. Thrombocytopenia: Due to critical illness.  Plts 67K today.   See above for plan going forwards.  Await family discussion again today, but favor ECMO decannulation/comfort care at this point.    CRITICAL CARE Performed by: Loralie Champagne  Total critical care time: 45 minutes  Critical care time was exclusive of separately billable procedures and treating other patients.  Critical care was necessary to treat or prevent imminent or life-threatening deterioration.  Critical care was time spent personally by me on the following activities: development of treatment plan with patient and/or surrogate as well as nursing, discussions with consultants, evaluation of patient's response to treatment, examination of patient, obtaining history from patient or surrogate, ordering and performing treatments and interventions, ordering and review of laboratory studies, ordering and review of radiographic studies, pulse oximetry and re-evaluation of patient's condition.  Loralie Champagne MD 06/16/2021 7:44 AM

## 2021-06-16 NOTE — Progress Notes (Signed)
PT Cancellation Note  Patient Details Name: Randall Hodges MRN: [id number] DOB: [birth year]   Cancelled Treatment:    Reason Eval/Treat Not Completed: Patient not medically ready Pt not appropriate for therapy at this time, awaiting family decisions on plan. Likely removing ECMO.   Randall Hodges 06/16/2021, 12:46 PM Marisa Severin, PT, DPT Acute Rehabilitation Services Pager [phone] Office [phone]

## 2021-06-16 NOTE — Progress Notes (Signed)
ANTICOAGULATION CONSULT NOTE  Pharmacy Consult for Bivalirudin Indication:  ECMO  Allergies  Allergen Reactions   Amoxicillin-Pot Clavulanate     Per pt report on 08/19/20, makes his urine dark colored   Atorvastatin Other (See Comments)     ( pt states causing runny nose, headaches, issue w/ urination)   Plant Derived Enzymes     Other reaction(s): Unknown   Trichophyton Other (See Comments)    Patient Measurements: Height: 5\' 10"  (177.8 cm) Weight: 92 kg (202 lb 13.2 oz) IBW/kg (Calculated) : 73  Vital Signs: Temp: 98.2 F (36.8 C) (02/28 1200) Temp Source: Core (02/28 1200) BP: 199/73 (02/28 1200) Pulse Rate: 80 (02/28 1200)  Labs: Recent Labs    06/15/21 0210 06/15/21 0525 06/15/21 1609 06/15/21 1616 06/16/21 0408 06/16/21 0411 06/16/21 1107  HGB 10.1*   < > 8.4*   < > 8.9* 8.5* 10.5*  HCT 32.8*   < > 27.4*   < > 27.7* 25.0* 31.0*  PLT 78*  --  66*  --  67*  --   --   APTT 70*  --  75*  --  71*  --   --   CREATININE 0.81  --  1.00  --  1.14  --   --    < > = values in this interval not displayed.     Estimated Creatinine Clearance: 67.8 mL/min (by C-G formula based on SCr of 1.14 mg/dL).  Assessment: 71 year old male with coronary artery disease who initially presented with chest pain, noted to have multivessel coronary artery disease. He underwent CABG x3 on 05/13/2021; course was complicated with atrial fibrillation, frequent hiccups and aspiration pneumonia leading to hypoxia and increasing oxygen requirement. Patient now requiring VV ECMO. Pharmacy consulted for bivalirudin IV.  aPTT therapeutic at 71 sec on bivalirudin drip 0.075mg /kg/hr - level drawn appropriately. CBC stable with no s/s of bleeding.   Goal of Therapy:  aPTT 50-80 seconds Monitor platelets by anticoagulation protocol: Yes   Plan:  Continue bivalirudin at 0.075 mg/kg/hr Monitor q12h aptt and CBC Monitor closely for s/sx bleeding/thrombus  Thank you for allowing pharmacy to  participate in this patient's care.  Levonne Spiller, PharmD PGY1 Acute Care Resident  06/16/2021,12:11 PM

## 2021-06-16 NOTE — Progress Notes (Signed)
Patient ID: Randall Hodges, male   DOB: 05/14/1950, 71 y.o.   MRN: [id number]     Progress Note from the Palliative Medicine Team at [facility]   Patient Name: Randall Hodges        Date: 06/16/2021 DOB: 08-12-1950  Age: 71 y.o. MRN#: [id number] Attending Physician: Randall Byes, MD Primary Care Physician: Randall Bunting, MD Admit Date: 04/20/2021   Medical records reviewed; labs, providers  notes  71 year old male with coronary artery disease who initially presented with chest pain, noted to have multivessel coronary artery disease, he underwent CABG x3 on 1/18/ 2023, course was complicated with atrial fibrillation, frequent hiccups and aspiration pneumonia leading to hypoxia and increasing oxygen requirement.  Significant Hospital events:  1/18 CABG 1/30 PCCM consult, Worsening O2 needs, likely aspiration, new pressor need 2/2 intubation, placed on VV ECMO 2/5 issues with recirc, bronch neg, improved with supine positioning 2/7 percutaneous tracheostomy 2/10 diuresis and weaning sedation. CT chest shows diffuse ground-glass opacification.  2/11 weaning sedation 2/13 increasing air hunger as oxygenator efficiency decreasing.  2/14 oxygenator changed.  Still having air hunger but able to follow commands and participate with therapy.  2/15 considerable distress with double stacking.  2/17 atrial flutter with RVR, amiodarone restarted  06-10-21 developed subcutaneous air following attempt at lung recruitment. 2/23-23  back on full rest ventilator settings with minimal spontaneous tidal volume. 06-16-21 unfortunately continues to decline within the context of full medical support.   This NP rounded on patient and discussed case with treatment team; bedside RN and Dr. Carlis Hodges.   Family at bedside.  I was able to speak to patient's wife by phone.  She understands the seriousness of the current medical situation and that in spite of full medical support her husband continues to  decline.   Pending decision for family is related to liberation from life support allowing for a natural death focusing on comfort and dignity.  Currently family is meeting with funeral home as they put his final plans into place.  Family plans to be back tomorrow to make decision regarding liberation from life support.  Education offered on the likely situation that without ECMO and vent support patient's prognosis is likely minutes to hours.  Logistics and process of liberation discussed.  Emotional support offered    This nurse practitioner and treatment team will meet again with family tomorrow for ongoing conversation regarding treatment plan.   Emotional support offered  PMT will continue to support holistically   Questions and concerns addressed     Wadie Lessen NP  Palliative Medicine Team Team Phone # [phone] Pager [phone]

## 2021-06-16 NOTE — Progress Notes (Signed)
Patient ID: Randall Hodges, male   DOB: 08-05-1950, 70 y.o.   MRN: [id number] Extracorporeal support note  ECLS support day: 25  Indication: ARDS  Configuration: VV  Drainage cannula: Crescent R IJ Return cannula: Crescent R IJ  Pump speed: 3450 rpm Pump flow: 4.7 L/min Pump used: Cardiohelp  Sweep gas: 6  Circuit check: Good color change. LDH stable 173.  Anticoagulant: Bivalirudin, PTT goal 50-80. PTT 71  Changes in support: No changes today.   Anticipated goals/duration of support: CT chest with worsening lungs, progressive fibrosis and nearly airless.  Family discussion today regarding future course, ?comfort measures.    Loralie Champagne, MD  7:42 AM

## 2021-06-16 NOTE — Progress Notes (Signed)
ANTICOAGULATION CONSULT NOTE  Pharmacy Consult for Bivalirudin Indication:  ECMO  Allergies  Allergen Reactions   Amoxicillin-Pot Clavulanate     Per pt report on 08/19/20, makes his urine dark colored   Atorvastatin Other (See Comments)     ( pt states causing runny nose, headaches, issue w/ urination)   Plant Derived Enzymes     Other reaction(s): Unknown   Trichophyton Other (See Comments)    Patient Measurements: Height: 5\' 10"  (177.8 cm) Weight: 92 kg (202 lb 13.2 oz) IBW/kg (Calculated) : 73  Vital Signs: Temp: 98.1 F (36.7 C) (02/28 2000) Temp Source: Core (02/28 2000) BP: 133/61 (02/28 2100) Pulse Rate: 78 (02/28 2115)  Labs: Recent Labs    06/15/21 1609 06/15/21 1616 06/16/21 0408 06/16/21 0411 06/16/21 1548 06/16/21 1550 06/16/21 1949  HGB 8.4*   < > 8.9*   < > 9.0* 9.2* 8.5*  HCT 27.4*   < > 27.7*   < > 28.9* 27.0* 25.0*  PLT 66*  --  67*  --  79*  --   --   APTT 75*  --  71*  --  76*  --   --   CREATININE 1.00  --  1.14  --  0.96  --   --    < > = values in this interval not displayed.     Estimated Creatinine Clearance: 80.5 mL/min (by C-G formula based on SCr of 0.96 mg/dL).  Assessment: 71 year old male with coronary artery disease who initially presented with chest pain, noted to have multivessel coronary artery disease. He underwent CABG x3 on 04/20/2021; course was complicated with atrial fibrillation, frequent hiccups and aspiration pneumonia leading to hypoxia and increasing oxygen requirement. Patient now requiring VV ECMO. Pharmacy consulted for bivalirudin IV.  aPTT therapeutic at 76 sec on bivalirudin drip 0.075mg /kg/hr - level drawn appropriately. CBC stable with no s/s of bleeding.   Goal of Therapy:  aPTT 50-80 seconds Monitor platelets by anticoagulation protocol: Yes   Plan:  Continue bivalirudin at 0.075 mg/kg/hr Monitor q12h aptt and CBC Monitor closely for s/sx bleeding/thrombus  Thank you for allowing pharmacy to  participate in this patient's care.  [place] Crane, Roylene Reason, BCCP Clinical Pharmacist  06/16/2021 10:00 PM   [facility] pharmacy phone numbers are listed on [email or web address]

## 2021-06-16 NOTE — Progress Notes (Signed)
This chaplain checked in with Pt. RN before the Pt. family anticipated arrival.  Randall Hodges [phone]

## 2021-06-16 NOTE — Progress Notes (Signed)
Nutrition Follow-up  DOCUMENTATION CODES:   Not applicable  INTERVENTION:   Tube Feeding via Cortrak (post-pyloric):  Vital 1.5 at 60 ml/h (1440 ml per day) Pro-source TF to 90 ml BID Provides 2320 kcal, 141 gm protein, 1094 ml free water daily   Total free water with flushes and TF: 2294 mL of free water  NUTRITION DIAGNOSIS:   Inadequate oral intake related to inability to eat as evidenced by NPO status.  Being addressed via TF   GOAL:   Patient will meet greater than or equal to 90% of their needs  Progressing  MONITOR:   TF tolerance, Diet advancement, Labs, Weight trends  REASON FOR ASSESSMENT:   Rounds    ASSESSMENT:   71 yo male admitted with acute coronary syndrome, underwent CABG x 3 on 1/18; developed intractable hiccups/muscle spasms, aspiration pneumonia with respiratory failure. PMH includes prostate cancer  1/18 CABG x 3 1/29 Transferred back to ICU due to hypotension, worsening respiratory status 1/30 Cortrak placed-tip near LOT, TF initiated 2/02 Intubated, VV ECMO cannulation for ARDS 2/07 Trach placed 2/16 Bronch, NG placed for decompression 2/27 CT chest/abdomen: worsening lung disease "near airless lungs"  Pt remains on vent support via trach; VV ECMO day 25. Noted possible decannulation tomorrow, [place] discussions ongoing  Cortrak and NG both in left nare. Cortrak is over the proximal jejunum, NG in stomach for decompression. NG with 500 mL  Tolerating Vital 1.5 at 60 ml/hr via post pyloric Cortrak Free water  200 mL q 4 hours  Rectal tube in place; about 700 mL in 24 hours   Labs: sodium 145 wdl, Creatinine wdl, BUN 56 Meds: lasix gtt, ss novolog, solumedrol, reglan, Kcl  Diet Order:   Diet Order             Diet NPO time specified  Diet effective midnight                   EDUCATION NEEDS:   Education needs have been addressed  Skin:  Skin Assessment: Skin Integrity Issues: Skin Integrity Issues:: Stage II Stage II:  nose d/t BiPap mask Incisions: sternum, leg, groin  Last BM:  2/21 rectal tube  Height:   Ht Readings from Last 1 Encounters:  06/01/21 5\' 10"  (1.778 m)    Weight:   Wt Readings from Last 1 Encounters:  06/16/21 92 kg    BMI:  Body mass index is 29.1 kg/m.  Estimated Nutritional Needs:   Kcal:  2200-2500 kcals  Protein:  125-150 g  Fluid:  1.8 L   Kerman Passey MS, RDN, LDN, CNSC Registered Dietitian III Clinical Nutrition RD Pager and On-Call Pager Number Located in [place]

## 2021-06-16 NOTE — Progress Notes (Signed)
OT Cancellation Note  Patient Details Name: Randall Hodges MRN: [id number] DOB: 05/21/1950   Cancelled Treatment:    Reason Eval/Treat Not Completed: Medical issues which prohibited therapy. Planning to switch to comfort care and transition off ECMO. Will sign off. Thank you for consult.   [place], OTR/L Acute Rehab Pager: [phone] Office: [phone] 06/16/2021, 4:27 PM

## 2021-06-17 ENCOUNTER — Inpatient Hospital Stay (HOSPITAL_COMMUNITY): Payer: PPO

## 2021-06-17 DIAGNOSIS — R0609 Other forms of dyspnea: Secondary | ICD-10-CM

## 2021-06-17 LAB — BPAM RBC
Blood Product Expiration Date: 202303272359
Blood Product Expiration Date: 202303272359
Blood Product Expiration Date: 202303282359
Blood Product Expiration Date: 202303282359
Blood Product Expiration Date: 202303272359
Blood Product Expiration Date: 202303272359
Blood Product Expiration Date: 202303282359
Blood Product Expiration Date: 202303282359
ISSUE DATE / TIME: 202302270959
ISSUE DATE / TIME: 202302270959
ISSUE DATE / TIME: 202302270959
ISSUE DATE / TIME: 202302270959
ISSUE DATE / TIME: 202302270959
ISSUE DATE / TIME: 202302270959
ISSUE DATE / TIME: 202302270959
ISSUE DATE / TIME: 202302270959
Unit Type and Rh: 5100
Unit Type and Rh: 5100
Unit Type and Rh: 5100
Unit Type and Rh: 5100
Unit Type and Rh: 5100
Unit Type and Rh: 5100
Unit Type and Rh: 5100
Unit Type and Rh: 5100

## 2021-06-17 LAB — TYPE AND SCREEN
ABO/RH(D): O POS
ABO/RH(D): O POS
Antibody Screen: NEGATIVE
Antibody Screen: NEGATIVE
Unit division: 0
Unit division: 0
Unit division: 0
Unit division: 0
Unit division: 0
Unit division: 0
Unit division: 0
Unit division: 0

## 2021-06-17 LAB — BASIC METABOLIC PANEL
Anion gap: 6 (ref 5–15)
BUN: 45 mg/dL — ABNORMAL HIGH (ref 8–23)
CO2: 26 mmol/L (ref 22–32)
Calcium: 8.1 mg/dL — ABNORMAL LOW (ref 8.9–10.3)
Chloride: 113 mmol/L — ABNORMAL HIGH (ref 98–111)
Creatinine, Ser: 0.94 mg/dL (ref 0.61–1.24)
GFR, Estimated: >60 mL/min (ref >60)
Glucose, Bld: 142 mg/dL — ABNORMAL HIGH (ref 70–99)
Potassium: 4.2 mmol/L (ref 3.5–5.1)
Sodium: 145 mmol/L (ref 135–145)

## 2021-06-17 LAB — POCT I-STAT 7, (LYTES, BLD GAS, ICA,H+H)
Acid-Base Excess: 0 mmol/L (ref 0.0–2.0)
Acid-base deficit: 1 mmol/L (ref 0.0–2.0)
Bicarbonate: 24.6 mmol/L (ref 20.0–28.0)
Bicarbonate: 25.6 mmol/L (ref 20.0–28.0)
Calcium, Ion: 1.26 mmol/L (ref 1.15–1.40)
Calcium, Ion: 1.22 mmol/L (ref 1.15–1.40)
HCT: 25 % — ABNORMAL LOW (ref 39.0–52.0)
HCT: 26 % — ABNORMAL LOW (ref 39.0–52.0)
Hemoglobin: 8.5 g/dL — ABNORMAL LOW (ref 13.0–17.0)
Hemoglobin: 8.8 g/dL — ABNORMAL LOW (ref 13.0–17.0)
O2 Saturation: 83 %
O2 Saturation: 84 %
Patient temperature: 36.9
Patient temperature: 36.7
Potassium: 4.2 mmol/L (ref 3.5–5.1)
Potassium: 4.1 mmol/L (ref 3.5–5.1)
Sodium: 146 mmol/L — ABNORMAL HIGH (ref 135–145)
Sodium: 146 mmol/L — ABNORMAL HIGH (ref 135–145)
TCO2: 26 mmol/L (ref 22–32)
TCO2: 27 mmol/L (ref 22–32)
pCO2 arterial: 42.6 mmHg (ref 32–48)
pCO2 arterial: 44.5 mmHg (ref 32–48)
pH, Arterial: 7.37 (ref 7.35–7.45)
pH, Arterial: 7.367 (ref 7.35–7.45)
pO2, Arterial: 49 mmHg — ABNORMAL LOW (ref 83–108)
pO2, Arterial: 49 mmHg — ABNORMAL LOW (ref 83–108)

## 2021-06-17 LAB — CBC
HCT: 28.6 % — ABNORMAL LOW (ref 39.0–52.0)
Hemoglobin: 9 g/dL — ABNORMAL LOW (ref 13.0–17.0)
MCH: 30.7 pg (ref 26.0–34.0)
MCHC: 31.5 g/dL (ref 30.0–36.0)
MCV: 97.6 fL (ref 80.0–100.0)
Platelets: 76 10*3/uL — ABNORMAL LOW (ref 150–400)
RBC: 2.93 MIL/uL — ABNORMAL LOW (ref 4.22–5.81)
RDW: 17.4 % — ABNORMAL HIGH (ref 11.5–15.5)
WBC: 11.2 10*3/uL — ABNORMAL HIGH (ref 4.0–10.5)
nRBC: 0 % (ref 0.0–0.2)

## 2021-06-17 LAB — GLUCOSE, CAPILLARY
Glucose-Capillary: 147 mg/dL — ABNORMAL HIGH (ref 70–99)
Glucose-Capillary: 147 mg/dL — ABNORMAL HIGH (ref 70–99)

## 2021-06-17 LAB — APTT: aPTT: 72 seconds — ABNORMAL HIGH (ref 24–36)

## 2021-06-17 LAB — LACTIC ACID, PLASMA: Lactic Acid, Venous: 1.1 mmol/L (ref 0.5–1.9)

## 2021-06-17 MED ORDER — GLYCOPYRROLATE 0.2 MG/ML IJ SOLN: 0.4000 mg | INTRAMUSCULAR | Status: DC | PRN

## 2021-06-17 MED ORDER — MIDAZOLAM HCL 2 MG/2ML IJ SOLN
2.0000 mg | INTRAMUSCULAR | Status: DC | PRN
  Administered 2021-06-17: 2 mg via INTRAVENOUS
  Filled 2021-06-17: qty 2

## 2021-06-17 DEATH — deceased

## 2021-06-18 LAB — TRANSFUSION REACTION
DAT C3: NEGATIVE
Post RXN DAT IgG: NEGATIVE

## 2021-06-18 LAB — CULTURE, BLOOD (ROUTINE X 2)
Culture: NO GROWTH
Culture: NO GROWTH

## 2021-06-22 ENCOUNTER — Other Ambulatory Visit: Payer: Self-pay | Admitting: Cardiology

## 2021-06-22 ENCOUNTER — Ambulatory Visit: Payer: PPO | Admitting: Cardiothoracic Surgery

## 2021-06-24 ENCOUNTER — Ambulatory Visit: Payer: PPO | Admitting: Gastroenterology

## 2021-06-25 LAB — FUNGUS CULTURE WITH STAIN

## 2021-06-25 LAB — FUNGAL ORGANISM REFLEX

## 2021-06-25 LAB — FUNGUS CULTURE RESULT

## 2021-06-29 ENCOUNTER — Ambulatory Visit: Payer: PPO | Admitting: Cardiothoracic Surgery

## 2021-07-18 NOTE — Progress Notes (Signed)
? ?NAME:  Randall Hodges, MRN:  [id number], DOB:  09-24-1950, LOS: 22 ?ADMISSION DATE:  05/16/2021, CONSULTATION DATE: 05/04/2021 ?REFERRING MD:  Dahlia Byes, MD, CHIEF COMPLAINT: Increasing shortness of breath ? ?History of Present Illness:  ?71 year old male with coronary artery disease who initially presented with chest pain, noted to have multivessel coronary artery disease, he underwent CABG x3 on 04/1818 23, course was complicated with atrial fibrillation, frequent hiccups and aspiration pneumonia leading to hypoxia and increasing oxygen requirement. ?PCCM was consulted for evaluation and help with management ? ?Patient stated hiccups are better but continued complain of shortness of breath, cough unable to bring up phlegm.  ?Pertinent  Medical History  ?CAD ?Allergic rhinitis ?Prostate cancer ?GERD ?Pulmonary fibrosis ? ?Significant Hospital Events: ?Including procedures, antibiotic start and stop dates in addition to other pertinent events   ?1/18 CABG ?1/30 PCCM consult, Worsening O2 needs, likely aspiration, new pressor need ?2/2 intubation, placed on VV ECMO ?2/5 issues with recirc, bronch neg, improved with supine positioning ?2/7 percutaneous tracheostomy ?2/10 diuresis and weaning sedation. CT chest shows diffuse ground-glass opacification.  ?2/11 weaning sedation ?2/13 increasing air hunger as oxygenator efficiency decreasing.  ?2/14 oxygenator changed.  Still having air hunger but able to follow commands and participate with therapy.  ?2/15 considerable distress with double stacking. Switched to true rest settings - with improvement in distress.  ?2/16 bronch, NGT placed to LIWS, con't post-pyloric TF ?2/17 DCCV overnight for Afib with RVR. Stood at bedside with PT. ?2/22 developed subcutaneous air following attempt at lung recruitment. ?2/23 back on full rest ventilator settings with minimal spontaneous tidal volume. ? ?Interim History / Subjective:  ?Overnight no acute events. ? ?Objective    ?Blood pressure (!) 110/51, pulse 82, temperature 98.1 ?F (36.7 ?C), temperature source Core, resp. rate 14, height 5\' 10"  (1.778 m), weight 93.2 kg, SpO2 (!) 88 %. ?CVP:  [4 mmHg-14 mmHg] 5 mmHg  ?Vent Mode: PCV ?FiO2 (%):  [50 %] 50 % ?Set Rate:  [10 bmp] 10 bmp ?PEEP:  [10 cmH20] 10 cmH20 ?Pressure Support:  [10 cmH20] 10 cmH20 ?Plateau Pressure:  [17 cmH20] 17 cmH20  ? ?Intake/Output Summary (Last 24 hours) at 06/25/21 0717 ?Last data filed at 06-25-21 0700 ?Gross per 24 hour  ?Intake 3847.3 ml  ?Output 2625 ml  ?Net 1222.3 ml  ? ? ?Filed Weights  ? 06/15/21 0500 06/16/21 0500 06-25-21 0500  ?Weight: 82.2 kg 92 kg 93.2 kg  ? ?Examination: ?General: critically ill appearing man lying in bed in NAD, on MV and ECMO ?HENT: [place]/AT, eyes anicteric ?Neck: RIJ ECMO cannula, trach ?Lungs: minimal breath sounds, Vt ~70cc ?Cardiovascular:  S1S2, RRR ?Abdomen:  NGT to suction, soft, NT ?Extremities: anasarca, no cyanosis. Una boots.  ?Neuro: RASS -4, some arm movement during exam ?GU: foley with light yellow urine ? ?vent 01/26/09/50% with Vt ~65cc ?ECMO 4.8L, 3450 RPM, Sweep 7L ? ?7.37/45/49/26 ?BG  130-150s ?BUN 45 ?Cr 0.94 ?H/H 9.0/28.6 ?Platelets 76 ?LA 1.1 ?PTT 72 ?CXR personally reviewed> persistent opacities bilaterally, air bronchograms ?2/25 resp culture> normal flora, final ?Blood cultures 2/25> NGTD ? ?Resolved problem list:  ?hypernatremia ? ?Assessment & Plan:  ? ?Acute hypoxic and hypercapnic respiratory failure ?ARDS ?Aspiration pneumonitis and pneumonia- normal flora ?Mild baseline subpleural fibrosis- question if this could be chronic aspiration related. Most recent CT abnormalities are acute airspace disease with development of underlying fibrotic changes. ?-Planning on withdrawing VV ECMO today due to lack of improvement and signs of progressive underlying fibrotic disease. Family  will be here later this morning. ?-VAP prevention protocol ?-Will increase sedation when family is ready to withdraw.   ?-routine trach care ?-hold lasix due to chugging ?-con't bival for circuit AC; will stop when off ecmo ? ?Hiccups, recurrent aspiration episodes. Concern for chronic aspiration. ?-Con't reglan and NGT to LIWS. Hold TF today. ?-PPI BID ?-con't baclofen ? ?CAD with UA s/p CABG x 3, (LIMA-LAD, SVG-OM, SVG-left PDA) ?NSVT ?Afib with RVR, DCCV, back in NSR ?-daily aspirin, statin ?-amiodarone gtt ?-hold Bblocker due to bradycardic episodes ?-tele monitoring ? ?Hypertension ?-hydralazine PRN ? ?Concern for recurrent infection, recultured 2/25 ?-con't vanc and cefepime; will stop if family planning to withdraw all aggressive support measures ?-follow cultures until finalized ? ?Hyperglycemia, not needing insulin ?-SSI PRN; goal BG <180 ? ?Thrombocytopenia, likely consumption in ECMO circuit ?-con't to monitor ? ?Acute anemia, expected post-op blood loss, consumption from ECMO circuit ?-transfuse for Hb <7 or hemodynamically significant bleeding ?-monitor ? ?Acute metabolic encephalopathy, ICU delirium ?-Con't enteral meds- morphine, seroquel, clonazepam. ?-Fentanyl gtt PRN for comfort, vent tolerance ?-frequent reorientation, family visitation, mobility ?-pain control ? ?Deconditioning ? ?Family meeting later this morning. ? ?Best Practice (right click and "Reselect all SmartList Selections" daily)  ? ?Diet/type: TF ?DVT prophylaxis: Bival ?CBG: SSI ?GI prophylaxis: PPI ?Lines: L [place] TLC, RIJ  ?Foley:  Yes ?Code Status:  full code ?Last date of multidisciplinary goals of care discussion [ 2/28]  ? ?This patient is critically ill with multiple organ system failure which requires frequent high complexity decision making, assessment, support, evaluation, and titration of therapies. This was completed through the application of advanced monitoring technologies and extensive interpretation of multiple databases. During this encounter critical care time was devoted to patient care services described in this note for 40  minutes. ? ?Julian Hy, DO 19-Jun-2021 8:25 AM ?[facility] ? ?

## 2021-07-18 NOTE — Progress Notes (Signed)
This chaplain responded to PMT page to join the Pt. and family( wife-Pam, son-David, and daughter-Kerri) for EOL spiritual care. The chaplain understands the family is present for the Pt. compassionate extubation. ? ?The family accepted the chaplain's invitation for storytelling, sharing scripture, and prayer at the bedside. The chaplain observed the family leaning on each other as a source of grief care. The chaplain prepared and mailed the Pt. hand prints to the family. ? ?This chaplain is available for F/U spiritual care as needed. ? ?Chaplain Sallyanne Kuster ?[phone] ?

## 2021-07-18 NOTE — Progress Notes (Signed)
Patient ID: Randall Hodges, male   DOB: 06-Sep-1950, 71 y.o.   MRN: [id number] ? ? ? ? ?Progress Note from the Palliative Medicine Team at [facility] ? ? ?Patient Name: Randall Hodges        ?Date: 06/30/21 ?DOB: 1951/03/14  Age: 71 y.o. MRN#: [id number] ?Attending Physician: Dahlia Byes, MD ?Primary Care Physician: [name], MD ?Admit Date: 05/12/2021 ? ? ?Medical records reviewed; labs, providers  notes ? ?71 year old male with coronary artery disease who initially presented with chest pain, noted to have multivessel coronary artery disease, he underwent CABG x3 on 1/18/ 07-15-21, course was complicated with atrial fibrillation, frequent hiccups and aspiration pneumonia leading to hypoxia and increasing oxygen requirement. ? ?Significant Hospital events: ? ?1/18 CABG ?1/30 PCCM consult, Worsening O2 needs, likely aspiration, new pressor need ?2/2 intubation, placed on VV ECMO ?2/5 issues with recirc, bronch neg, improved with supine positioning ?2/7 percutaneous tracheostomy ?2/10 diuresis and weaning sedation. CT chest shows diffuse ground-glass opacification.  ?2/11 weaning sedation ?2/13 increasing air hunger as oxygenator efficiency decreasing.  ?2/14 oxygenator changed.  Still having air hunger but able to follow commands and participate with therapy.  ?2/15 considerable distress with double stacking.  ?2/17 atrial flutter with RVR, amiodarone restarted ? 06-10-21 developed subcutaneous air following attempt at lung recruitment. ?2/23-23  back on full rest ventilator settings with minimal spontaneous tidal volume. ?06-16-21 unfortunately continues to decline within the context of full medical support. ?30-Jun-2021 family has made decision to liberate patient from life sustaining interventions ? ? ?This NP rounded on patient and discussed case with treatment team; bedside RN and Dr. Carlis Abbott, ECMO nursing team,   family at bedside. ? ?I spoke with family at bedside, offered my condolences.  All understand that  wants life support is discontinued patient's death will likely occur in a very brief period of time. ? ?Education offered on the expected physical changes that family will see as patient transitions at end-of-life; skin color changes, possible throat secretions as, respiratory irregularities and inability to communicate. ? ?Education offered on the process and logistics of discontinuing both the ventilator and the ECMO machine..   Sedation will be initiated, patient will be removed from the ventilator and then ECMO machine will be clamped. ? ?Comfort and dignity are the ultimate goal for [name] at end-of-life. ? ?Discussed with nursing the utilization of  ?-fentanyl drip with boluses ?-increasing sweep on ECMO ?-  versed boluses, and Robinul to enhance comfort at end-of-life. ? ?Chaplain present at bedside. ? ?Treatment team and family present and as expected patient expired shortly after ceasing life prolonging measures.  Time of death 07-16-1110 ? ?Emotional support offered ? ?  ?Wadie Lessen NP  ?Palliative Medicine Team Team Phone # [phone] ?Pager [phone] ?  ?

## 2021-07-18 NOTE — Progress Notes (Signed)
Nutrition Brief Note ? ?Chart reviewed. ?Pt now transitioning to comfort care.  ?No further nutrition interventions planned at this time.  ?Please re-consult as needed.  ? ? Randall Casebolt MS, RDN, LDN, CNSC ?Registered Dietitian III ?Clinical Nutrition ?RD Pager and On-Call Pager Number Located in [place]  ? ? ?

## 2021-07-18 NOTE — Death Summary Note (Signed)
DEATH SUMMARY   Patient Details  Name: Randall Hodges MRN: [id number] DOB: [birth year]  Admission/Discharge Information   Admit Date:  05/02/2021  Date of Death: Date of Death: 06/18/2021  Time of Death: Time of Death: Jul 04, 1110  Length of Stay: 17-Mar-72  Referring Physician: [name], MD   Reason(s) for Hospitalization   Unstable angina  Diagnoses  Preliminary cause of death: Acute respiratory failure Secondary Diagnoses (including complications and co-morbidities):  Principal Problem:   Acute coronary syndrome ([place]) Active Problems:   S/P CABG x 3   Intractable hiccups   AKI (acute kidney injury) ([place])   Dysphagia   Pressure injury of skin   Acute respiratory failure with hypoxemia ([place])  ARDS requiring mechanical ventilation and ECMO Acute respiratory failure with hypoxia and hypercapnia Aspiration pneumonia Aspiration pneumonitis Acute heart failure- HFpEF Shock, septic Anasarca Mild subpleural fibrosis Hiccups Chronic aspiration Afib with RVR, atrial flutter NSVT Hypertension Hypergylcemia Acute anemia due to blood loss thrombocytopenia Acute metabolic encephalopathy ICU delirium Deconditioning hypernatremia hyperglycemia Hypocalcemia Deconditioning GERD  Brief Hospital Course (including significant findings, care, treatment, and services provided and events leading to death)  Randall Hodges is a 71 y.o. year old male who presented on 06-May-2021 with chest pain for over a day. He underwent LHC that day demonstrating 3 vessel disease. He underwent 3-vessel CABG (LIMA to LAD, SVG to OM, SVG to PD) on 1/18. Post-operatively he struggled with hiccups that were refractory and resulting in vomiting. 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 He had acute HFrEF post-op with EF ~45%. He developed pneumonia treated with antibiotics through multiple courses. Due to vomiting he developed aspiration pneumonitis and  pneumonia. He was managed on BiPAP but had prolonged and refractory respiratory failure, necessitating intubation for mechanical ventilation. Due to severe hypercapnia, he required ECMO cannulation on 1/30. Despite 4 weeks of ECMO support, he developed fibrotic lung changes and ongoing severe respiratory failure. His family made the decision to withdraw aggressive life support to focus on comfort. He passed with family at bedside on June 19, 2022.   Pertinent Labs and Studies  Significant Diagnostic Studies CT ABDOMEN WO CONTRAST  Result Date: 06/15/2021 CLINICAL DATA:  Respiratory illness with nondiagnostic x-ray. EXAM: CT CHEST, ABDOMEN AND PELVIS WITHOUT CONTRAST TECHNIQUE: Multidetector CT imaging of the chest, abdomen and pelvis was performed following the standard protocol without IV contrast. RADIATION DOSE REDUCTION: This exam was performed according to the departmental dose-optimization program which includes automated exposure control, adjustment of the mA and/or kV according to patient size and/or use of iterative reconstruction technique. COMPARISON:  05/29/2021 FINDINGS: CT CHEST FINDINGS Cardiovascular: Normal heart size. Unchanged pericardial fluid and/or thickening. ECMO catheter traverses the right atrium. Right PICC with tip at the lower SVC. Tracheostomy tube in place. Enteric and feeding tubes with enteric tube tip at the stomach and feeding tube tip at the fourth portion duodenum. No acute vascular finding. Prior CABG. Mediastinum/Nodes: Small volume pneumomediastinum. No adenopathy or mass. Lungs/Pleura: Worsening aeration. Definite fibrotic features with traction bronchiectasis diffusely. The lower lobes are completely airless, it is unclear if this is generalized collapse from ECMO, change in ventilator status, or worsening acute lung injury. Increase in bilateral pleural effusion which is moderate to large, measuring up to 3 cm in thickness bilaterally where dependent. Fluid stacks in the upper  trachea above the tracheostomy cuff. Pulmonary interstitial emphysema seen affecting the left upper lobe. 2.6 cm ovoid nodule in the subpleural left lung at approximately the  7-8 intercostal space. Musculoskeletal: No acute finding CT ABDOMEN FINDINGS Hepatobiliary: No focal liver abnormality.No evidence of biliary obstruction or stone. Pancreas: Unremarkable. Spleen: Unremarkable. Adrenals/Urinary Tract: Negative adrenals. No hydronephrosis or ureteral stone. 4 mm right renal calculus. Unremarkable bladder. Stomach/Bowel: No obstruction. No visible bowel inflammation. Tubes as described above. Vascular/Lymphatic: Atheromatous calcification of the aorta. No mass or adenopathy. Other: Body wall edema. Musculoskeletal: No acute abnormalities. Remote compression fractures at L1 and L2. IMPRESSION: 1. Compared to 05/29/2021, worsening aeration with nearly airless lungs. Definite pulmonary fibrotic changes with diffuse traction bronchiectasis. Subpleural interstitial lung disease was seen on a 2019 chest CT but not to the extent noted presently. 2. Mild pneumomediastinum. Findings of barotrauma and pulmonary interstitial emphysema at the left upper lobe. 3. Progressive bilateral pleural effusion which are moderate to large. Subpleural nodule in the posterior left chest at the 7-8 and rib interspace, stable from prior. Has there been thoracentesis? 4. Unchanged pericardial fluid and/or thickening. 5. Unremarkable hardware. Electronically Signed   By: Jorje Guild M.D.   On: 06/15/2021 11:21   DG Abd 1 View  Result Date: 06/10/2021 CLINICAL DATA:  Ileus. EXAM: ABDOMEN - 1 VIEW COMPARISON:  Chest radiograph 06/10/2021 FINDINGS: Redemonstrated opacities within the lung bases bilaterally. Incompletely visualized ECMO cannula. Enteric tube projects over the proximal small bowel. Gas is demonstrated within nondilated loops of large and small bowel without obstructive pattern. Lumbar spine degenerative changes.  IMPRESSION: Enteric tube projects over the left upper quadrant, likely in the proximal small bowel. No dilated loops of bowel identified on current exam. Electronically Signed   By: Lovey Newcomer M.D.   On: 06/10/2021 11:09   DG Abd 1 View  Result Date: 06/04/2021 CLINICAL DATA:  Feeding tube placement. EXAM: ABDOMEN - 1 VIEW COMPARISON:  May 18, 2021. FINDINGS: Distal tip of feeding tube is seen in the expected position of the ligament of Treitz. ECMO device is again noted. No abnormal bowel dilatation. IMPRESSION: Distal tip of feeding tube seen in expected position of the ligament of Treitz. Electronically Signed   By: Marijo Conception M.D.   On: 06/04/2021 08:03   CT CHEST WO CONTRAST  Result Date: 06/15/2021 CLINICAL DATA:  Respiratory illness with nondiagnostic x-ray. EXAM: CT CHEST, ABDOMEN AND PELVIS WITHOUT CONTRAST TECHNIQUE: Multidetector CT imaging of the chest, abdomen and pelvis was performed following the standard protocol without IV contrast. RADIATION DOSE REDUCTION: This exam was performed according to the departmental dose-optimization program which includes automated exposure control, adjustment of the mA and/or kV according to patient size and/or use of iterative reconstruction technique. COMPARISON:  05/29/2021 FINDINGS: CT CHEST FINDINGS Cardiovascular: Normal heart size. Unchanged pericardial fluid and/or thickening. ECMO catheter traverses the right atrium. Right PICC with tip at the lower SVC. Tracheostomy tube in place. Enteric and feeding tubes with enteric tube tip at the stomach and feeding tube tip at the fourth portion duodenum. No acute vascular finding. Prior CABG. Mediastinum/Nodes: Small volume pneumomediastinum. No adenopathy or mass. Lungs/Pleura: Worsening aeration. Definite fibrotic features with traction bronchiectasis diffusely. The lower lobes are completely airless, it is unclear if this is generalized collapse from ECMO, change in ventilator status, or worsening  acute lung injury. Increase in bilateral pleural effusion which is moderate to large, measuring up to 3 cm in thickness bilaterally where dependent. Fluid stacks in the upper trachea above the tracheostomy cuff. Pulmonary interstitial emphysema seen affecting the left upper lobe. 2.6 cm ovoid nodule in the subpleural left lung at approximately the 7-8  intercostal space. Musculoskeletal: No acute finding CT ABDOMEN FINDINGS Hepatobiliary: No focal liver abnormality.No evidence of biliary obstruction or stone. Pancreas: Unremarkable. Spleen: Unremarkable. Adrenals/Urinary Tract: Negative adrenals. No hydronephrosis or ureteral stone. 4 mm right renal calculus. Unremarkable bladder. Stomach/Bowel: No obstruction. No visible bowel inflammation. Tubes as described above. Vascular/Lymphatic: Atheromatous calcification of the aorta. No mass or adenopathy. Other: Body wall edema. Musculoskeletal: No acute abnormalities. Remote compression fractures at L1 and L2. IMPRESSION: 1. Compared to 05/29/2021, worsening aeration with nearly airless lungs. Definite pulmonary fibrotic changes with diffuse traction bronchiectasis. Subpleural interstitial lung disease was seen on a 2019 chest CT but not to the extent noted presently. 2. Mild pneumomediastinum. Findings of barotrauma and pulmonary interstitial emphysema at the left upper lobe. 3. Progressive bilateral pleural effusion which are moderate to large. Subpleural nodule in the posterior left chest at the 7-8 and rib interspace, stable from prior. Has there been thoracentesis? 4. Unchanged pericardial fluid and/or thickening. 5. Unremarkable hardware. Electronically Signed   By: Jorje Guild M.D.   On: 06/15/2021 11:21   CARDIAC CATHETERIZATION  Result Date: 06/02/2021 Successful VV ECMO cannulation and initiation.   DG CHEST PORT 1 VIEW  Result Date: July 02, 2021 CLINICAL DATA:  Tracheostomy, ECMO, ARDS EXAM: PORTABLE CHEST 1 VIEW COMPARISON:  06/16/2021 FINDINGS:  Tracheostomy, right PICC line, feeding tube, NG tube, and ECMO device all appear stable in position. Previous coronary bypass changes noted. Unchanged bilateral severe diffuse and consolidative airspace opacities. Similar bilateral pleural effusions. Displaced acute right second rib fracture again noted. IMPRESSION: 1. Stable support apparatus. 2. No significant interval change in severe diffuse bilateral airspace process and bilateral pleural effusions. Electronically Signed   By: Jerilynn Mages.  Shick M.D.   On: 2021/07/02 08:19   DG CHEST PORT 1 VIEW  Result Date: 06/16/2021 CLINICAL DATA:  Tracheostomy, ECMO. EXAM: PORTABLE CHEST 1 VIEW COMPARISON:  June 14, 2021. FINDINGS: Stable cardiomediastinal silhouette. Tracheostomy, feeding and nasogastric tubes are unchanged. ECMO device is again noted. Right-sided PICC line is unchanged. Stable bilateral lung opacities are noted. Bony thorax is unremarkable. IMPRESSION: Stable support apparatus.  Stable bilateral lung opacities. Electronically Signed   By: Marijo Conception M.D.   On: 06/16/2021 08:13   DG CHEST PORT 1 VIEW  Result Date: 06/14/2021 CLINICAL DATA:  Follow-up ventilator support EXAM: PORTABLE CHEST 1 VIEW COMPARISON:  Earlier same day FINDINGS: Tracheostomy remains in place. Nasogastric tube tip just within the stomach. Soft feeding tube extends farther into the intestine, tip not visualized. Right arm PICC tip in the SVC above the right atrium. ECMO device appears unchanged. Widespread pulmonary opacity persists and could questionably be worsened. No new finding is seen. IMPRESSION: Lines and tubes are unchanged. Diffuse pulmonary opacity, questionably slightly worsened since earlier today. Electronically Signed   By: Nelson Chimes M.D.   On: 06/14/2021 23:40   DG CHEST PORT 1 VIEW  Result Date: 06/14/2021 CLINICAL DATA:  Personal HX of ECMO.  Follow-up study. EXAM: PORTABLE CHEST 1 VIEW COMPARISON:  06/13/2021 and older exams. FINDINGS: Extensive  bilateral airspace lung opacities are without significant change. No pneumothorax. Tracheostomy tube, enteric/nasal/orogastric tubes, right-sided PICC and ECMO catheters are stable. IMPRESSION: 1. No significant change from the previous day's study. 2. Persistent and extensive bilateral airspace lung opacities. No pneumothorax. 3. Stable support apparatus. Electronically Signed   By: Lajean Manes M.D.   On: 06/14/2021 09:00   DG CHEST PORT 1 VIEW  Result Date: 06/13/2021 CLINICAL DATA:  Chest pain.  Follow-up exam. EXAM:  PORTABLE CHEST 1 VIEW COMPARISON:  06/12/2021 and older studies. FINDINGS: Extensive bilateral airspace lung opacities are without change. No convincing pneumothorax.  No mediastinal widening. Tracheostomy tube, enteric tube, nasal/orogastric tube and right PICC are stable. Posterior right second rib fracture again noted. Stable neck base subcutaneous emphysema. IMPRESSION: 1. No significant change from the previous day's study. 2. Persistent and extensive bilateral airspace lung opacities with suspected associated pleural effusions. 3. No convincing pneumothorax. 4. Stable support apparatus. Electronically Signed   By: Lajean Manes M.D.   On: 06/13/2021 08:35   DG CHEST PORT 1 VIEW  Result Date: 06/12/2021 CLINICAL DATA:  ECMO cannula, tracheostomy tube EXAM: PORTABLE CHEST 1 VIEW COMPARISON:  May 22, 2021 FINDINGS: Tracheostomy tube appears appropriate in positioning. Unchanged ECMO cannula. Nasogastric and enteric feeding catheter coursing below the diaphragm with the NG tube tip overlying the stomach and enteric feeding catheter tip excluded by collimation. Right upper extremity PICC appears unchanged in positioning likely terminating within the SVC positioning with exact may difficult by the superimposed ECMO cannula. Prior median sternotomy and CABG. The heart size and mediastinal contours are partially obscured but appear unchanged. Similar diffuse bilateral airspace opacities  and bilateral pleural effusions. No visible pneumothorax. Extensive left greater than right subcutaneous gas is similar prior. Displaced right first rib fracture appears unchanged. IMPRESSION: 1. Stable support lines and tubes described above. 2. Similar diffuse bilateral airspace opacities and bilateral pleural effusions. 3. Extensive left greater than right subcutaneous gas is unchanged from prior. No visible pneumothorax. Electronically Signed   By: Dahlia Bailiff M.D.   On: 06/12/2021 08:16   DG CHEST PORT 1 VIEW  Result Date: 06/11/2021 CLINICAL DATA:  Transfusion related acute lung injury. EXAM: PORTABLE CHEST 1 VIEW COMPARISON:  06/11/2021 FINDINGS: Diffuse lung densities with marked consolidation in the mid and lower lungs, left side greater than right. Lung findings have minimally changed. Heart size is grossly stable with prior median sternotomy and post CABG changes. There is a tracheostomy tube. Nasogastric tube terminates in the upper abdomen and feeding tube extends beyond the image. Again noted is a large bore ECMO cannula that appears to be extending from the right jugular vein into the IVC. Again noted is displaced lateral right first rib fracture. Negative for pneumothorax. Right arm PICC line probably terminates in the SVC but difficult to evaluate due to the ECMO cannula. Increased subcutaneous gas in the left chest. Again noted is subcutaneous gas in the right supraclavicular region. IMPRESSION: 1. No significant change in the bilateral parenchymal lung disease. Extensive consolidation / airspace disease in the mid and lower lungs bilaterally. 2. Increased subcutaneous gas particularly in the left chest. No significant pneumothorax identified. 3. Support apparatuses as described, including ECMO cannula. Electronically Signed   By: Markus Daft M.D.   On: 06/11/2021 14:10   DG CHEST PORT 1 VIEW  Result Date: 06/11/2021 CLINICAL DATA:  ECMO EXAM: PORTABLE CHEST 1 VIEW COMPARISON:  06/10/2021  FINDINGS: Tracheostomy tube remains in place. Stable cardiomegaly with post CABG changes. ECMO cannula unchanged. NG tube terminates within the stomach. Additional enteric tube extends beyond the inferior margin of the film. Stable right-sided PICC line. Widespread bilateral airspace opacities, similar to prior. Persistent bilateral pleural effusions. No appreciable pneumothorax. Right second rib fracture. Subcutaneous emphysema at the base of the neck on the right. IMPRESSION: 1. Stable bilateral airspace opacities and bilateral pleural effusions. 2. Stable support lines and tubes. Electronically Signed   By: Davina Poke D.O.   On:  06/11/2021 08:34   DG CHEST PORT 1 VIEW  Result Date: 06/10/2021 CLINICAL DATA:  ECMO EXAM: PORTABLE CHEST 1 VIEW COMPARISON:  06/09/2021 FINDINGS: Tracheostomy tube in stable positioning. Stable cardiomegaly with post CABG changes. ECMO cannula unchanged. NG tube terminates within the stomach. Additional enteric tube extends beyond the inferior margin of the field of view. Stable right-sided PICC line. Widespread bilateral airspace opacities with slightly improving aeration of the upper lung fields. Persistent bilateral pleural effusions, similar. No appreciable pneumothorax. IMPRESSION: 1. Stable lines and tubes. 2. Widespread bilateral airspace opacities with slightly improving aeration of the upper lung fields. 3. Persistent bilateral pleural effusions. Electronically Signed   By: Davina Poke D.O.   On: 06/10/2021 08:33   DG CHEST PORT 1 VIEW  Result Date: 06/09/2021 CLINICAL DATA:  Lurline Idol present on ECMO EXAM: PORTABLE CHEST 1 VIEW COMPARISON:  Radiograph 06/08/2021 FINDINGS: Tracheostomy tube overlies the midthoracic trachea. Unchanged obscured cardiomediastinal silhouette with prior median sternotomy and CABG. Unchanged ECMO cannula. Feeding tube passes below the diaphragm, tip excluded by collimation. The other nasogastric tube tip and side port overlies the  stomach advanced since the prior exam. Fibular pads overlie the left lower chest. No significant change in diffuse airspace disease and bilateral pleural effusions. There is no visible pneumothorax. Unchanged right upper rib fracture. IMPRESSION: No significant change in diffuse airspace disease. Electronically Signed   By: Maurine Simmering M.D.   On: 06/09/2021 08:20   DG CHEST PORT 1 VIEW  Result Date: 06/08/2021 CLINICAL DATA:  History of ECMO. EXAM: PORTABLE CHEST 1 VIEW COMPARISON:  AP chest 06/07/2021 FINDINGS: Tracheostomy tube again overlies the midline trachea. Status post median sternotomy. Enteric tube again descends below the diaphragm with the tip excluded by collimation. A second enteric tube tip likely terminates over the distal esophagus, similar to prior. Right upper extremity PICC tip again overlies the superior right atrium. Diffuse bilateral heterogeneous airspace opacities slightly greater on the left are similar to prior. The costophrenic angles are opacified. No pneumothorax is visualized. The cardiothymic silhouette is again obscured. Mildly displaced right posterolateral second rib fracture is unchanged. IMPRESSION:: IMPRESSION: 1. Persistent diffuse bilateral heterogeneous airspace opacities with likely bilateral pleural effusions, not significantly changed from prior. 2. Support apparatus appears unchanged. Electronically Signed   By: Yvonne Kendall M.D.   On: 06/08/2021 08:05   DG CHEST PORT 1 VIEW  Result Date: 06/07/2021 CLINICAL DATA:  Acute coronary syndrome EXAM: PORTABLE CHEST 1 VIEW COMPARISON:  Radiograph 06/06/2021 FINDINGS: Tracheostomy tube overlies the midthoracic trachea. Unchanged ECMO cannula. There are 2 enteric tubes, 1 of which passes below the diaphragm, tip excluded by collimation. The other tube appears to a been retracted with tip near the GE junction. Unchanged right upper extremity PICC tip overlying the right atrium. Unchanged obscured cardiomediastinal  silhouette. Persistent diffuse airspace disease and bilateral pleural effusions not significantly changed from prior. Intact sternotomy wires. Unchanged right second rib fracture. IMPRESSION: Persistent diffuse airspace disease and bilateral pleural effusions, not significantly changed from prior exam. There are 2 enteric tubes, 1 of which passes below the diaphragm with tip excluded by collimation, and the other with tip overlying the gastroesophageal junction. Electronically Signed   By: Maurine Simmering M.D.   On: 06/07/2021 08:46   DG CHEST PORT 1 VIEW  Result Date: 06/06/2021 CLINICAL DATA:  Lurline Idol present, on ECMO EXAM: PORTABLE CHEST 1 VIEW COMPARISON:  Radiograph 06/05/2021 FINDINGS: Unchanged tracheostomy tube and ECMO cannula. Right upper extremity PICC tip overlies the right atrium.  There are 2 enteric tubes passing below the diaphragm, one of which has a tip excluded by collimation and the other tip overlying the stomach and slightly retracted from prior exam. Unchanged obscured cardiomediastinal silhouette. Similar diffuse airspace disease, slight small areas of improvement in the right upper and lower lung. Unchanged effusions. No visible pneumothorax. Unchanged rib fractures. IMPRESSION: Similar diffuse airspace disease with slight small areas of improvement in the right upper and lower lung. Unchanged effusions. Lines and tubes as described above. Electronically Signed   By: Maurine Simmering M.D.   On: 06/06/2021 08:24   DG CHEST PORT 1 VIEW  Result Date: 06/05/2021 CLINICAL DATA:  ECMO.  Tracheostomy. EXAM: PORTABLE CHEST 1 VIEW COMPARISON:  June 04, 2021. FINDINGS: Stable cardiomediastinal silhouette. Tracheostomy tube is unchanged. ECMO device is unchanged. Stable diffuse opacification of both lungs is noted concerning for edema, pneumonia or ARDS. Stable probable bilateral pleural effusions. Bony thorax is unremarkable. IMPRESSION: Stable support apparatus. Stable bilateral lung opacities and  effusions as described above. Electronically Signed   By: Marijo Conception M.D.   On: 06/05/2021 07:47   DG CHEST PORT 1 VIEW  Result Date: 06/04/2021 CLINICAL DATA:  Tracheostomy.  ECMO. EXAM: PORTABLE CHEST 1 VIEW COMPARISON:  June 03, 2021. FINDINGS: Stable cardiomediastinal silhouette. Tracheostomy is in good position. ECMO device is noted. Feeding tube is seen passing through esophagus and into stomach. There appears to be increased opacification throughout both lungs suggesting worsening pneumonia, edema or possibly ARDS. Small pleural effusions are noted. Bony thorax is unremarkable. IMPRESSION: Stable support apparatus. Worsening bilateral lung opacities as described above. Electronically Signed   By: Marijo Conception M.D.   On: 06/04/2021 08:06   DG CHEST PORT 1 VIEW  Result Date: 06/03/2021 CLINICAL DATA:  Tracheostomy present, on ECMO EXAM: PORTABLE CHEST 1 VIEW COMPARISON:  Radiograph 06/02/2021 FINDINGS: Unchanged tracheostomy tube and ECMO cannula. Feeding tube passes below the diaphragm, tip excluded by collimation. Intact sternotomy wires. CABG. Unchanged cardiomediastinal silhouette. Diffuse airspace disease, slightly increased throughout the right lung. And small bilateral pleural effusions. No visible pneumothorax. There is a mild displaced right second rib fracture, unchanged. IMPRESSION: Diffuse airspace disease, slightly increased in the right lung. Unchanged bilateral pleural effusions. Unchanged ECMO cannula and tracheostomy tube. Electronically Signed   By: Maurine Simmering M.D.   On: 06/03/2021 08:23   DG CHEST PORT 1 VIEW  Result Date: 06/02/2021 CLINICAL DATA:  Tracheostomy.  ECMO device. EXAM: PORTABLE CHEST 1 VIEW COMPARISON:  June 01, 2021. FINDINGS: Stable cardiomegaly. Tracheostomy tube is in good position. Feeding tube is seen entering stomach. ECMO device is unchanged. Stable bilateral lung opacities are noted consistent with ARDS. Bony thorax is unremarkable.  IMPRESSION: Stable support apparatus. Stable bilateral lung opacities as described above. Electronically Signed   By: Marijo Conception M.D.   On: 06/02/2021 08:24   DG CHEST PORT 1 VIEW  Result Date: 06/01/2021 CLINICAL DATA:  ARDS EXAM: PORTABLE CHEST 1 VIEW COMPARISON:  Chest radiograph from one day prior. FINDINGS: Tracheostomy tube tip overlies the tracheal air column at the thoracic inlet. Enteric tube enters stomach with the tip not seen on this image. Right chest ECMO catheter in place. Right PICC terminates over the cavoatrial junction. Stable cardiomediastinal silhouette with mild cardiomegaly. No pneumothorax. Stable biapical pleural thickening. No definite pleural effusions. Extensive patchy opacities throughout both lungs, not appreciably changed accounting for overall slightly lower lung volumes. IMPRESSION: 1. Well-positioned support structures. 2. Extensive patchy opacities throughout both lungs,  not appreciably changed accounting for overall slightly lower lung volumes, compatible with reported ARDS. Electronically Signed   By: Ilona Sorrel M.D.   On: 06/01/2021 08:03   DG CHEST PORT 1 VIEW  Result Date: 05/31/2021 CLINICAL DATA:  71 year old male with history of respiratory failure. EXAM: PORTABLE CHEST 1 VIEW COMPARISON:  Chest x-ray 05/30/2021. FINDINGS: A tracheostomy tube is in place with tip 4.7 cm above the carina. A feeding tube is seen extending into the abdomen, however, the tip of the feeding tube extends below the lower margin of the image. ECMO cannula projecting over the right-side of the mediastinum. Status post median sternotomy for CABG. Widespread areas of interstitial prominence and patchy airspace disease again noted throughout the lungs bilaterally. Small bilateral pleural effusions. No pneumothorax. Heart size is mildly enlarged. Atherosclerotic calcifications in the thoracic aorta. IMPRESSION: 1. The appearance the chest is very similar to the prior examination, with  diffuse interstitial and airspace disease in the lungs, which could reflect pulmonary edema and/or multilobar pneumonia. 2. Aortic atherosclerosis. 3. Support apparatus and postoperative changes, as above. Electronically Signed   By: Vinnie Langton M.D.   On: 05/31/2021 08:32   DG CHEST PORT 1 VIEW  Result Date: 05/30/2021 CLINICAL DATA:  Chest pain EXAM: PORTABLE CHEST 1 VIEW COMPARISON:  05/29/2021 FINDINGS: Stable cardiomediastinal contours. ECMO device is again noted. Tracheostomy tube tip is stable above the carina. There is a feeding tube which courses below the level of the GE junction. The tip is below the field of view. Right arm PICC line tip is at the level of the cavoatrial junction. Status post median sternotomy and CABG procedure. Diffuse bilateral interstitial opacities are unchanged compatible with pulmonary edema. IMPRESSION: 1. No change in aeration to the lungs compared with prior exam. 2. Stable support apparatus. Electronically Signed   By: Kerby Moors M.D.   On: 05/30/2021 16:43   DG CHEST PORT 1 VIEW  Result Date: 05/29/2021 CLINICAL DATA:  Tracheostomy, on ECMO. EXAM: PORTABLE CHEST 1 VIEW COMPARISON:  May 28, 2021. FINDINGS: Stable cardiomediastinal silhouette. Tracheostomy and feeding tube are in good position. ECMO device is again noted. Status post coronary bypass graft. Stable bilateral lung opacities are noted which may represent atelectasis or edema. Small pleural effusions may be present. Bony thorax is unremarkable. IMPRESSION: Stable support apparatus.  Stable bibasilar opacities. Electronically Signed   By: Marijo Conception M.D.   On: 05/29/2021 08:10   DG CHEST PORT 1 VIEW  Result Date: 05/28/2021 CLINICAL DATA:  Lurline Idol present.  On ECMO. EXAM: PORTABLE CHEST 1 VIEW COMPARISON:  AP chest 08/24/2021 FINDINGS: Status post median sternotomy and CABG. Tracheostomy tube overlies the midline trachea. Enteric tube descends below the diaphragm with the tip excluded by  collimation inferiorly. ECMO device is noted. Interval removal of left subclavian central venous catheter. Moderate bilateral interstitial thickening with mild bibasilar heterogeneous airspace opacities. Unchanged minimally improved aeration of the bilateral lungs. Again small bilateral pleural effusions may be present. Mildly displaced acute lateral right second rib fracture is seen. IMPRESSION:: IMPRESSION: 1. Interval removal of left subclavian central venous catheter. Otherwise support staple apparatus. 2. Moderate bilateral interstitial thickening and mild bibasilar heterogeneous airspace opacities with small pleural effusions. Electronically Signed   By: Yvonne Kendall M.D.   On: 05/28/2021 08:23   DG CHEST PORT 1 VIEW  Result Date: 05/27/2021 CLINICAL DATA:  Tracheostomy.  ECMO. EXAM: PORTABLE CHEST 1 VIEW COMPARISON:  May 26, 2021. FINDINGS: Stable cardiomediastinal silhouette. Status post coronary  bypass graft. Tracheostomy is in grossly good position. Feeding tube is seen entering stomach. ECMO device is noted. Left subclavian catheter is unchanged. Stable diffuse lung opacities are noted concerning for edema. Small pleural effusions may be present. No pneumothorax is noted. Mildly displaced right second rib fracture is noted. IMPRESSION: Stable support apparatus. Stable bilateral opacities as described above. Electronically Signed   By: Marijo Conception M.D.   On: 05/27/2021 08:01   DG Chest Port 1 View  Result Date: 05/26/2021 CLINICAL DATA:  Status post tracheostomy.  On ECMO. EXAM: PORTABLE CHEST 1 VIEW COMPARISON:  Radiographs earlier the same date, 05/25/2021 and 05/24/2021. CT 06/02/2017. FINDINGS: 1118 hours. Interval tracheostomy. The tracheostomy is well positioned. Feeding tube, left subclavian central venous catheter and ECMO apparatus appear unchanged. The heart size and mediastinal contours are stable status post median sternotomy and CABG. Further slight improvement in pulmonary  edema with chronic biapical scarring. No evidence of pneumothorax or significant pleural effusion. There is a mildly displaced fracture of the right 2nd rib which appears unchanged. No new fractures are identified. IMPRESSION: 1. Satisfactory position of the tracheostomy. No pneumothorax. Additional components of the support system appear unchanged. 2. Further slight improvement in pulmonary edema. Electronically Signed   By: Richardean Sale M.D.   On: 05/26/2021 11:27   DG CHEST PORT 1 VIEW  Result Date: 05/26/2021 CLINICAL DATA:  Personal history of ECMO Z92.81 (ICD-10-CM) ARDS (adult respiratory distress syndrome) (HCC) J80 (ICD-10-CM) EXAM: PORTABLE CHEST 1 VIEW COMPARISON:  May 25, 2021. FINDINGS: ET tube tip at the level of the clavicular heads, unchanged. Enteric tube courses below the diaphragm and outside the field of view. Similar position of a left subclavian approach central venous catheter. Partially imaged ECMO cannula. Slightly improved aeration of the lungs with persistent diffuse airspace and interstitial opacities. Probable trace bilateral pleural effusions. No visible pneumothorax on this semi erect radiograph. Similar cardiomediastinal silhouette. IMPRESSION: 1. Slightly improved aeration of the lungs with persistent diffuse airspace and interstitial opacities. 2. Similar position of lines/tubes. Electronically Signed   By: Margaretha Sheffield M.D.   On: 05/26/2021 08:16   DG CHEST PORT 1 VIEW  Result Date: 05/25/2021 CLINICAL DATA:  ECMO EXAM: PORTABLE CHEST 1 VIEW COMPARISON:  Radiograph dated May 24, 2021 FINDINGS: Unchanged position of ET tube left subclavian line, feeding tube, and visualized ECMO cannula. Cardiac and mediastinal contours are unchanged. Diffuse heterogeneous pulmonary opacities. Probable trace bilateral pleural effusions. No evidence of pneumothorax. IMPRESSION: Stable chest x-ray. Electronically Signed   By: Yetta Glassman M.D.   On: 05/25/2021 08:13   DG  CHEST PORT 1 VIEW  Result Date: 05/24/2021 CLINICAL DATA:  Acute respiratory failure.  Ventilator support. EXAM: PORTABLE CHEST 1 VIEW COMPARISON:  05/24/2021 earlier same day FINDINGS: Endotracheal tube tip 3 cm above the carina. Left subclavian central line tip in the SVC above the right atrium. ECMO device overlies the region, unchanged. Soft feeding tube enters the abdomen. Widespread pulmonary edema persists but is slightly diminished. No worsening or new finding. Acute fracture of the right first and second ribs as seen previously. IMPRESSION: Endotracheal tube advanced slightly, tip now 3 cm above the carina. Slightly improved/diminished pulmonary edema. No other change. Electronically Signed   By: Nelson Chimes M.D.   On: 05/24/2021 17:20   DG Chest Port 1 View  Result Date: 05/24/2021 CLINICAL DATA:  ARDS. EXAM: PORTABLE CHEST 1 VIEW COMPARISON:  05/23/2021 FINDINGS: 0457 hours. The cardio pericardial silhouette is enlarged. Diffuse bilateral airspace  disease is similar to prior. Endotracheal tube tip is 5.8 cm above the base of the carina. Feeding tube passes into the duodenum although the tip is not included on the film. Left subclavian central line tip overlies the distal SVC level. ECMO cannula again noted over the right chest and paramidline abdomen. Stable appearance of the right second rib fracture. No pneumothorax or substantial pleural effusion. IMPRESSION: 1. No substantial interval change in exam. 2. Diffuse bilateral airspace disease. 3. Support apparatus as above. Electronically Signed   By: Misty Stanley M.D.   On: 05/24/2021 08:08   DG Chest Port 1 View  Result Date: 05/23/2021 CLINICAL DATA:  Chest pain, shortness of breath EXAM: PORTABLE CHEST 1 VIEW COMPARISON:  Previous studies including the examination of 05/22/2021 FINDINGS: Transverse diameter of heart is increased. Tip of endotracheal tube is 6.7 cm above the carina. NG tube is noted traversing the esophagus. Tip of central  venous catheter is seen in the superior vena cava. There is a large caliber catheter following the course of right IJ superior vena cava, right atrium and inferior vena cava. Extensive patchy interstitial and alveolar densities seen in both lungs. There is interval worsening of infiltrates in the right lower lung fields. There is slight decrease in alveolar densities in both upper lung fields. There is blunting of both lateral CP angles. There is no pneumothorax. IMPRESSION: There is interval worsening of infiltrates in the right lower lung fields. Diffuse extensive increased markings are seen throughout both lungs suggesting pulmonary edema and possibly underlying pneumonia. Small bilateral pleural effusions. Electronically Signed   By: Elmer Picker M.D.   On: 05/23/2021 08:15   DG Chest Port 1 View  Result Date: 05/22/2021 CLINICAL DATA:  ECMO, intubated EXAM: PORTABLE CHEST 1 VIEW COMPARISON:  Chest radiograph from one day prior. FINDINGS: Endotracheal tube tip is 5.5 cm above the carina. Enteric tube enters stomach with the tip not seen on this image. Left subclavian central venous catheter terminates over the lower third of the SVC. ECMO catheter in place in the medial right chest. Stable cardiomediastinal silhouette with mild cardiomegaly. No pneumothorax. Severe patchy consolidation throughout both lungs, perhaps slightly improved at the right lung base. Biapical pleural thickening, unchanged. IMPRESSION: 1. Well-positioned support structures. No pneumothorax. 2. Severe patchy consolidation throughout both lungs, perhaps slightly improved at the right lung base. 3. Stable biapical pleural thickening, potentially loculated pleural effusions versus scarring. Electronically Signed   By: Ilona Sorrel M.D.   On: 05/22/2021 08:24   DG Chest Port 1 View  Result Date: 05/25/2021 CLINICAL DATA:  Congestive heart failure EXAM: PORTABLE CHEST 1 VIEW COMPARISON:  05/27/2021 at 2:50 p.m. FINDINGS: Single  frontal view of the chest demonstrates stable endotracheal tube, enteric catheter, and left subclavian catheter. The cardiac silhouette is enlarged. There has been interval progression of the bilateral interstitial and ground-glass opacities. Continued biapical densities may reflect pleural thickening or pleural fluid. Right posterior second rib fracture unchanged. IMPRESSION: 1. Progressive bilateral airspace disease consistent with worsening edema or infection. 2. Stable biapical densities consistent with pleural fluid. 3. Stable support devices. Electronically Signed   By: Randa Ngo M.D.   On: 06/01/2021 17:52   DG CHEST PORT 1 VIEW  Result Date: 05/24/2021 CLINICAL DATA:  Central line EXAM: PORTABLE CHEST 1 VIEW COMPARISON:  05/27/2021 radiograph FINDINGS: Endotracheal tube overlies the midthoracic trachea. There is a right upper extremity PICC with tip overlying the distal superior vena cava. There is a right neck catheter sheath  present without internal catheter. There is a new left subclavian catheter with tip overlying the distal superior vena cava. Feeding tube passes below the diaphragm, tip excluded by collimation. Prior median sternotomy and CABG. Unchanged cardiomediastinal silhouette. There are diffuse interstitial and airspace opacities, not significantly changed since the prior exam. Small bilateral pleural effusions, unchanged. No visible pneumothorax. Unchanged right first and second rib fractures. IMPRESSION: New left subclavian catheter tip overlies the distal superior vena cava. Unchanged diffuse interstitial and airspace disease consistent with pulmonary edema and/or multifocal infection. Electronically Signed   By: Maurine Simmering M.D.   On: 05/22/2021 15:03   Portable Chest x-ray  Result Date: 06/01/2021 CLINICAL DATA:  Shortness of breath EXAM: PORTABLE CHEST 1 VIEW COMPARISON:  Previous studies including the examination done earlier today FINDINGS: There is interval placement of  endotracheal tube with its tip approximately 5.2 cm above the carina. Enteric tube is noted traversing the esophagus. Transverse diameter of heart is increased. Extensive interstitial and alveolar densities seen in both lungs with interval improvement. There is blunting of both lateral CP angles. Apical pleural densities seen. There is no demonstrable pneumothorax. Tip of PICC line is seen in the superior vena cava. IMPRESSION: There is interval placement of endotracheal tube. Extensive interstitial and alveolar densities in both lungs suggest pulmonary edema or pneumonia. Small bilateral pleural effusions. Electronically Signed   By: Elmer Picker M.D.   On: 06/11/2021 08:51   DG Chest Port 1 View  Result Date: 05/20/2021 CLINICAL DATA:  Shortness of breath EXAM: PORTABLE CHEST 1 VIEW COMPARISON:  Previous studies including the examination of 05/20/2021 FINDINGS: Transverse diameter of heart is increased. Enteric tube is noted traversing the esophagus. Tip of PICC line is seen in the superior vena cava. Diffuse interstitial and alveolar densities seen in both lungs with possible slight worsening. Lateral CP angles are indistinct. There is no pneumothorax. Slightly displaced fracture is seen in the right second rib with no significant change. IMPRESSION: Diffuse interstitial and alveolar densities are noted throughout both lungs suggesting pulmonary edema or pneumonia with possible slight interval worsening. Electronically Signed   By: Elmer Picker M.D.   On: 06/07/2021 08:18   DG Abd Portable 1V  Result Date: 06/04/2021 CLINICAL DATA:  Encounter for NG tube placement EXAM: PORTABLE ABDOMEN - 1 VIEW COMPARISON:  Radiograph performed earlier on the same date. FINDINGS: Weighted tip feeding tube with distal tip projecting over the proximal jejunum. Interval placement of NG tube with distal tip and side port projecting over the stomach. ECMO device is unchanged. Gaseous distention of colonic loops.  Nonobstructive bowel gas pattern. Diffuse lung opacities in the lung bases, unchanged. IMPRESSION: Interval placement of NG tube with distal tip and side port projecting over the body of the stomach. No other significant interval change. Electronically Signed   By: Keane Police D.O.   On: 06/04/2021 15:22   ECHOCARDIOGRAM LIMITED  Result Date: 06/02/2021    ECHOCARDIOGRAM LIMITED REPORT   Patient Name:   Randall Hodges Date of Exam: 06/02/2021 Medical Rec #:  [id number]       Height:       70.0 in Accession #:    [id number]      Weight:       188.7 lb Date of Birth:  02/20/1951       BSA:          2.036 m Patient Age:    27 years        BP:  145/69 mmHg Patient Gender: M               HR:           77 bpm. Exam Location:  Inpatient Procedure: Limited Echo, Cardiac Doppler and Limited Color Doppler Indications:    ECMO  History:        Patient has prior history of Echocardiogram examinations, most                 recent 06/01/2021.  Sonographer:    Arlyss Gandy Referring Phys: [place]  1. Significant variation in septal movement, presumed to be respiratory although not monitored during the study. No evaluation of MV inflow. No significant pericardial effusion on this study, has improved since 04/19/2021. IVC is collpasing, which goes against significant pericardial constriction. RA ECMO catheter seen in the RA, well positioned. EF unchanged ~40-45%. Left ventricular ejection fraction, by estimation, is 40 to 45%. The left ventricle has mildly decreased function. The left ventricle demonstrates regional wall motion abnormalities (see scoring diagram/findings for description).  2. Right ventricular systolic function is moderately reduced. The right ventricular size is normal. There is mildly elevated pulmonary artery systolic pressure. The estimated right ventricular systolic pressure is 56.4 mmHg.  3. The mitral valve is grossly normal. Trivial mitral valve regurgitation. No evidence  of mitral stenosis.  4. Tricuspid valve regurgitation is mild to moderate.  5. The aortic valve is tricuspid.  6. The inferior vena cava is normal in size with greater than 50% respiratory variability, suggesting right atrial pressure of 3 mmHg. Comparison(s): No significant change from prior study. FINDINGS  Left Ventricle: Significant variation in septal movement, presumed to be respiratory although not monitored during the study. No evaluation of MV inflow. No significant pericardial effusion on this study, has improved since 04/23/2021. IVC is collpasing,  which goes against significant pericardial constriction. RA ECMO catheter seen in the RA, well positioned. EF unchanged ~40-45%. Left ventricular ejection fraction, by estimation, is 40 to 45%. The left ventricle has mildly decreased function. The left ventricle demonstrates regional wall motion abnormalities. The left ventricular internal cavity size was normal in size. There is no left ventricular hypertrophy.  LV Wall Scoring: The antero-lateral wall and posterior wall are akinetic. Right Ventricle: The right ventricular size is normal. No increase in right ventricular wall thickness. Right ventricular systolic function is moderately reduced. There is mildly elevated pulmonary artery systolic pressure. The tricuspid regurgitant velocity is 2.76 m/s, and with an assumed right atrial pressure of 8 mmHg, the estimated right ventricular systolic pressure is 33.2 mmHg. Pericardium: Trivial pericardial effusion is present. Mitral Valve: The mitral valve is grossly normal. Trivial mitral valve regurgitation. No evidence of mitral valve stenosis. Tricuspid Valve: The tricuspid valve is grossly normal. Tricuspid valve regurgitation is mild to moderate. No evidence of tricuspid stenosis. Aortic Valve: The aortic valve is tricuspid. Venous: The inferior vena cava is normal in size with greater than 50% respiratory variability, suggesting right atrial pressure of 3  mmHg. LEFT VENTRICLE PLAX 2D LVIDd:         3.70 cm LVIDs:         2.90 cm LV PW:         1.10 cm LV IVS:        1.10 cm  TRICUSPID VALVE TR Peak grad:   30.5 mmHg TR Vmax:        276.00 cm/s Eleonore Chiquito MD Electronically signed by Eleonore Chiquito MD Signature Date/Time:  06/02/2021/8:48:35 AM    Final    ECHOCARDIOGRAM LIMITED  Result Date: 05/30/2021    ECHOCARDIOGRAM LIMITED REPORT   Patient Name:   Randall Hodges Date of Exam: 05/27/2021 Medical Rec #:  [id number]       Height:       70.0 in Accession #:    [id number]      Weight:       185.6 lb Date of Birth:  04/19/1951       BSA:          2.022 m Patient Age:    22 years        BP:           125/46 mmHg Patient Gender: M               HR:           94 bpm. Exam Location:  Inpatient Procedure: Limited Echo, Cardiac Doppler and Color Doppler Indications:    CHF-Acute Systolic A35.57  History:        Patient has prior history of Echocardiogram examinations, most                 recent 04/25/2021. CAD, Prior CABG; Arrythmias:Atypical atrial                 flutter/PVCs. Acute kidney injury.  Sonographer:    Darlina Sicilian RDCS Referring Phys: [place] Comments: Echo performed with patient supine and on artificial respirator. Going for ECMO cannulation IMPRESSIONS  1. Poor acoustic windows. Septal flattening consistent with RV volume/pressure overload LVEF appears moderate to severely depressed (images 23,24) Inferolateral wall is akinetic mid; anterosept is dyskinetic mid / distal.  2. Right ventricular systolic function is mildly reduced. The right ventricular size is normal. There is severely elevated pulmonary artery systolic pressure.  3. No significant variation in mitral inflow with respiration.Marland Kitchen a small pericardial effusion is present. Large pleural effusion in the left lateral region.  4. The mitral valve is normal in structure. Trivial mitral valve regurgitation.  5. TR is eccentric. Limited evaluation from RV inflow view. Doppler  images suggest TR is at least moderate in severity.  6. The aortic valve is tricuspid. Aortic valve regurgitation is mild. FINDINGS  Left Ventricle: Poor acoustic windows. Septal flattening consistent with RV volume/pressure overload LVEF appears moderate to severely depressed (images 23,24) Inferolateral wall is akinetic mid; anterosept is dyskinetic mid / distal. Right Ventricle: The right ventricular size is normal. Right ventricular systolic function is mildly reduced. There is severely elevated pulmonary artery systolic pressure. The tricuspid regurgitant velocity is 3.64 m/s, and with an assumed right atrial pressure of 8 mmHg, the estimated right ventricular systolic pressure is 32.2 mmHg. Right Atrium: Right atrial size was normal in size. Pericardium: No significant variation in mitral inflow with respiration. A small pericardial effusion is present. Mitral Valve: The mitral valve is normal in structure. There is mild thickening of the mitral valve leaflet(s). Mild to moderate mitral annular calcification. Trivial mitral valve regurgitation. Tricuspid Valve: TR is eccentric. Limited evaluation from RV inflow view. Doppler images suggest TR is at least moderate in severity. The tricuspid valve is normal in structure. Aortic Valve: The aortic valve is tricuspid. Aortic valve regurgitation is mild. Pulmonic Valve: The pulmonic valve was normal in structure. Additional Comments: There is a large pleural effusion in the left lateral region. TRICUSPID VALVE TR Peak grad:   53.0 mmHg TR Vmax:  364.00 cm/s Dorris Carnes MD Electronically signed by Dorris Carnes MD Signature Date/Time: 05/30/2021/3:36:44 PM    Final    [place] EKG SITE RITE  Result Date: 05/26/2021 If Site Rite image not attached, placement could not be confirmed due to current cardiac rhythm.  CT CHEST ABDOMEN PELVIS WO CONTRAST  Result Date: 05/29/2021 CLINICAL DATA:  Eval for pulm fibrosis/cpEval for pulm fibrosis EXAM: CT CHEST, ABDOMEN AND  PELVIS WITHOUT CONTRAST TECHNIQUE: Multidetector CT imaging of the chest, abdomen and pelvis was performed following the standard protocol without IV contrast. RADIATION DOSE REDUCTION: This exam was performed according to the departmental dose-optimization program which includes automated exposure control, adjustment of the mA and/or kV according to patient size and/or use of iterative reconstruction technique. COMPARISON:  Chest CT 06/02/2017 FINDINGS: CT CHEST FINDINGS Cardiovascular: Post CABG. ECMO cannulation catheter extends through the RIGHT atrium into the IVC. RIGHT PICC line with tip in the distal SVC. Trace pericardial effusion Mediastinum/Nodes: Tracheostomy tube in good position mid trachea. Feeding tube extends to the esophagus. No mediastinal adenopathy. No axillary supraclavicular adenopathy Lungs/Pleura: diffuse ground-glass opacities in lungs consistent with atelectasis and mild edema. Small bilateral pleural effusions. Underlying subpleural interstitial lung disease is difficult to evaluate the background of diffuse ground-glass densities. Interstitial lung disease moderately progressed from 2019. Musculoskeletal: Midline sternotomy CT ABDOMEN AND PELVIS FINDINGS Hepatobiliary: Normal liver. High-density fluid in the gallbladder related to prior cardiac catheterization (vicarious excretion of contrast). Pancreas: Pancreas is normal. No ductal dilatation. No pancreatic inflammation. Spleen: Normal spleen Adrenals/urinary tract: Adrenal glands and kidneys are normal. The ureters and bladder normal. Foley catheter in place Stomach/Bowel: Feeding tube extends through the stomach into the fourth portion the duodenum. No small bowel dilatation or obstruction. Fluid stool in the colon. Rectum normal. Rectal tube in place. Vascular/Lymphatic: Abdominal aorta is normal caliber with atherosclerotic calcification. There is no retroperitoneal or periportal lymphadenopathy. No pelvic lymphadenopathy.  Reproductive: Unremarkable Other: No free fluid. Musculoskeletal: No aggressive osseous lesion. IMPRESSION: Chest Impression: 1. Support apparatus appears in good position. ECMO cannulation extends through the RIGHT atrium. 2. Diffuse ground-glass opacities consistent with atelectasis and edema presumably related to ECMO physiology and respiratory failure. 3. Underlying moderate interstitial lung disease mildly progressed from prior CT. 4. Bilateral pleural effusions. 5. Post CABG without complication. Abdomen / Pelvis Impression: 1. Feeding tube extends in to the fourth portion duodenum. 2. No acute findings abdomen pelvis. Electronically Signed   By: Suzy Bouchard M.D.   On: 05/29/2021 09:43    Microbiology Recent Results (from the past 240 hour(s))  Culture, blood (routine x 2)     Status: None   Collection Time: 06/13/21  7:50 PM   Specimen: BLOOD  Result Value Ref Range Status   Specimen Description BLOOD LEFT HAND  Final   Special Requests   Final    BOTTLES DRAWN AEROBIC ONLY Blood Culture results may not be optimal due to an inadequate volume of blood received in culture bottles   Culture   Final    NO GROWTH 5 DAYS Performed at [facility], [place] [address], [place], [place] [address]    Report Status 06/18/2021 FINAL  Final  Culture, Respiratory w Gram Stain     Status: None   Collection Time: 06/13/21  8:21 PM   Specimen: Tracheal Aspirate; Respiratory  Result Value Ref Range Status   Specimen Description TRACHEAL ASPIRATE  Final   Special Requests NONE  Final   Gram Stain   Final  NO SQUAMOUS EPITHELIAL CELLS SEEN FEW WBC SEEN RARE GRAM POSITIVE RODS    Culture   Final    FEW Normal respiratory flora-no Staph aureus or Pseudomonas seen Performed at [facility], [address], [place],  [address]    Report Status 06/15/2021 FINAL  Final  Culture, blood (routine x 2)     Status: None   Collection Time: 06/13/21 10:05 PM   Specimen: BLOOD  Result  Value Ref Range Status   Specimen Description BLOOD LEFT FINGER  Final   Special Requests   Final    BOTTLES DRAWN AEROBIC ONLY Blood Culture results may not be optimal due to an inadequate volume of blood received in culture bottles   Culture   Final    NO GROWTH 5 DAYS Performed at [facility], [place] [address], [place],  [address]    Report Status 06/18/2021 FINAL  Final    Lab Basic Metabolic Panel: Recent Labs  Lab 06/13/21 1620 06/13/21 1628 06/15/21 0210 06/15/21 0525 06/15/21 1609 06/15/21 1616 06/16/21 0408 06/16/21 0411 06/16/21 1548 06/16/21 1550 06/16/21 1949 06/16/21 2351 06/20/2021 0353 June 20, 2021 0356 Jun 20, 2021 0637  NA 143   < > 143   < > 143   < > 144   < > 143   < > 145 146* 146* 145 146*  K 4.0   < > 3.7   < > 4.2   < > 3.9   < > 4.1   < > 4.0 4.8 4.2 4.2 4.1  CL 102   < > 105  --  105  --  107  --  111  --   --   --   --  113*  --   CO2 31   < > 29  --  28  --  27  --  26  --   --   --   --  26  --   GLUCOSE 169*   < > 139*  --  141*  --  142*  --  161*  --   --   --   --  142*  --   BUN 45*   < > 47*  --  49*  --  56*  --  48*  --   --   --   --  45*  --   CREATININE 0.85   < > 0.81  --  1.00  --  1.14  --  0.96  --   --   --   --  0.94  --   CALCIUM 8.4*   < > 7.9*  --  8.0*  --  8.0*  --  8.0*  --   --   --   --  8.1*  --   MG 2.4  --   --   --   --   --   --   --   --   --   --   --   --   --   --    < > = values in this interval not displayed.   Liver Function Tests: Recent Labs  Lab 06/13/21 0220 06/14/21 0217 06/15/21 0210 06/16/21 0408  AST 24 20 21 20   ALT 50* 38 38 33  ALKPHOS 86 74 90 88  BILITOT 0.8 0.8 0.7 0.7  PROT 5.0* 4.5* 4.7* 4.4*  ALBUMIN 3.1* 2.8* 2.5* 2.5*   No results for input(s): LIPASE, AMYLASE in the last  168 hours. No results for input(s): AMMONIA in the last 168 hours. CBC: Recent Labs  Lab 06/15/21 0210 06/15/21 0525 06/15/21 1609 06/15/21 1616 06/16/21 0408 06/16/21 0411 06/16/21 1548  06/16/21 1550 06/16/21 1949 06/16/21 2351 2021/07/10 0353 07/10/21 0356 10-Jul-2021 0637  WBC 9.5  --  7.6  --  9.3  --  11.2*  --   --   --   --  11.2*  --   HGB 10.1*   < > 8.4*   < > 8.9*   < > 9.0*   < > 8.5* 8.5* 8.5* 9.0* 8.8*  HCT 32.8*   < > 27.4*   < > 27.7*   < > 28.9*   < > 25.0* 25.0* 25.0* 28.6* 26.0*  MCV 95.9  --  96.5  --  95.5  --  97.0  --   --   --   --  97.6  --   PLT 78*  --  66*  --  67*  --  79*  --   --   --   --  76*  --    < > = values in this interval not displayed.   Cardiac Enzymes: No results for input(s): CKTOTAL, CKMB, CKMBINDEX, TROPONINI in the last 168 hours. Sepsis Labs: Recent Labs  Lab 06/13/21 0220 06/13/21 1620 06/15/21 1609 06/16/21 0408 06/16/21 1548 Jul 10, 2021 0356  WBC 10.9*   < > 7.6 9.3 11.2* 11.2*  LATICACIDVEN 1.2  --   --  1.1  --  1.1   < > = values in this interval not displayed.    Procedures/Operations  LHC CABG x 3 vessels Intubation Bronchoscopy CVC TEE cardioversion EMCO cannulation PICC line Tracheostomy    Julian Hy 06/19/2021, 2:33 PM

## 2021-07-18 NOTE — Progress Notes (Signed)
Patient ID: BROADY LAFOY, male   DOB: February 02, 1951, 71 y.o.   MRN: [id number] ?Extracorporeal support note ? ?ECLS support day: 256 ?Indication: ARDS ? ?Configuration: VV ? ?Drainage cannula: Crescent R IJ ?Return cannula: Crescent R IJ ? ?Pump speed: 3600 rpm ?Pump flow: 5.0 L/min ?Pump used: Cardiohelp ? ?Sweep gas: 7 ? ?Circuit check: Good color change. LDH stable 173.  ?Anticoagulant: Bivalirudin, PTT goal 50-80. PTT 72 ? ?Changes in support: Plan to decannulate with comfort care.  ? ?Anticipated goals/duration of support: CT chest with worsening lungs, progressive fibrosis and nearly airless.  Plan decannulation with comfort care after family arrival ? ?Loralie Champagne, MD  ?7:46 AM ? ? ? ? ?

## 2021-07-18 NOTE — Progress Notes (Signed)
Pharmacy Antibiotic Note ? ?Randall Hodges is a 71 y.o. male admitted on 05/19/2021. Continues on ECMO and now r/o PNA/purulent trach secreations.  Pharmacy has been consulted for Cefepime and Vancomycin dosing.  ? ?Plan: ?Cefepime 2gm IV q8h ?Vancomycin 1500 mg now then 1747m IV q24h (pt with therapeutic trough on this dose before) ?Will f/u renal function, micro data, and pt's clinical condition ?Vanc levels prn ? ?Height: _0  (177.8 cm) ?Weight: 93.2 kg (205 lb 7.5 oz) ?IBW/kg (Calculated) : 73 ? ?Temp (24hrs), Avg:98.3 ?F (36.8 ?C), Min:98.1 ?F (36.7 ?C), Max:98.6 ?F (37 ?C) ? ?Recent Labs  ?Lab 06/11/21 ?0402 06/11/21 ?1644 06/12/21 ?0356702/24/23 ?1700 06/13/21 ?0220 06/13/21 ?1620 06/15/21 ?0210 06/15/21 ?1609 06/16/21 ?0408 06/16/21 ?1548 [id number]  ?WBC 11.3*   < > 11.1*   < > 10.9*   < > 9.5 7.6 9.3 11.2* 11.2*  ?CREATININE 0.96   < > 0.93   < > 0.94   < > 0.81 1.00 1.14 0.96 0.94  ?LATICACIDVEN 1.1  --  1.0  --  1.2  --   --   --  1.1  --  1.1  ? < > = values in this interval not displayed.  ? ?  ?Estimated Creatinine Clearance: 82.7 mL/min (by C-G formula based on SCr of 0.94 mg/dL).   ? ?Allergies  ?Allergen Reactions  ? Amoxicillin-Pot Clavulanate   ?  Per pt report on 08/19/20, makes his urine dark colored  ? Atorvastatin Other (See Comments)  ?   ( pt states causing runny nose, headaches, issue w/ urination)  ? Plant Derived Enzymes   ?  Other reaction(s): Unknown  ? Trichophyton Other (See Comments)  ? ? ?Antimicrobials this admission: ?Cefepime 1/29 >> 1/30, restart 2/25>> ?Zosyn 1/30 >>2/2 ?Meropenem 2/2> 2/9 ?Vanc 1/31 >> 2/5, 2/15>2/22; restart 2/25>> ?  ?2/25 Trach asp: ?2/25 BCx: ?2/16 BAL: normal flora ?2/14 TA: GPC >NF ?2/14 bld - GPC 1/2> MRSE  ?2/2 BAL: ng, PJP negative ?2/2 resp panel: neg ?1/31 ESR 55 ?2/1 covid- negative ?2/1 MRSA PCR- negative ?2/1 flu negative ? ?Thank you for allowing pharmacy to be a part of this patientRandalls care. ? ?MEinar [id number] AM ? ?

## 2021-07-18 NOTE — Progress Notes (Addendum)
Expiration Note: ? ?Family at bedside at time of death.  ?TOD: 1112 (No heart tones or breath sounds auscultated x1 full minute) ?Pronounced by: Winfred Burn and Misha Ferolito,RN ?No belongings at bedside.  Dr. Ernest Mallick present at time of death and spoke w/family. ?

## 2021-07-18 NOTE — Progress Notes (Signed)
ANTICOAGULATION CONSULT NOTE ? ?Pharmacy Consult for Bivalirudin ?Indication:  ECMO ? ?Allergies  ?Allergen Reactions  ? Amoxicillin-Pot Clavulanate   ?  Per pt report on 08/19/20, makes his urine dark colored  ? Atorvastatin Other (See Comments)  ?   ( pt states causing runny nose, headaches, issue w/ urination)  ? Plant Derived Enzymes   ?  Other reaction(s): Unknown  ? Trichophyton Other (See Comments)  ? ? ?Patient Measurements: ?Height: 5\' 10"  (177.8 cm) ?Weight: 92 kg (202 lb 13.2 oz) ?IBW/kg (Calculated) : 73 ? ?Vital Signs: ?Temp: 98.2 ?F (36.8 ?C) (03/01 0000) ?Temp Source: Core (03/01 0000) ?BP: 110/54 (03/01 0330) ?Pulse Rate: 81 (03/01 0330) ? ?Labs: ?Recent Labs  ?  06/16/21 ?0408 06/16/21 ?0411 06/16/21 ?1548 06/16/21 ?1550 06/16/21 ?2351 07-09-21 ?0353 07-09-2021 ?0356  ?HGB 8.9*   < > 9.0*   < > 8.5* 8.5* 9.0*  ?HCT 27.7*   < > 28.9*   < > 25.0* 25.0* 28.6*  ?PLT 67*  --  79*  --   --   --  76*  ?APTT 71*  --  76*  --   --   --  72*  ?CREATININE 1.14  --  0.96  --   --   --  0.94  ? < > = values in this interval not displayed.  ? ? ? ?Estimated Creatinine Clearance: 82.2 mL/min (by C-G formula based on SCr of 0.94 mg/dL). ? ?Assessment: ?71 year old male with coronary artery disease who initially presented with chest pain, noted to have multivessel coronary artery disease. He underwent CABG x3 on 04/25/2021; course was complicated with atrial fibrillation, frequent hiccups and aspiration pneumonia leading to hypoxia and increasing oxygen requirement. Patient now requiring VV ECMO. Pharmacy consulted for bivalirudin IV. ? ?aPTT therapeutic at 72 sec on bivalirudin drip 0.075mg /kg/hr - level drawn appropriately. CBC stable. ?RN reports that bleeding around canula site seems to have increased slightly overnight (dressing saturated).  ? ?Goal of Therapy:  ?aPTT 50-80 seconds ?Monitor platelets by anticoagulation protocol: Yes ?  ?Plan:  ?Continue bivalirudin at 0.075 mg/kg/hr ?Monitor q12h aptt and  CBC ?Monitor closely for s/sx bleeding/thrombus ? ?Thank you for allowing pharmacy to participate in this patient's care. ? ?Netta Cedars, Pharm D, BCPS ?Clinical Pharmacist ? 2021-07-09 4:35 AM  ? ?[facility] pharmacy phone numbers are listed on [email or web address] ? ? ? ? ? ? ? ? ?

## 2021-07-18 NOTE — Progress Notes (Signed)
ECMO discontinued at 1100. Comfort measures in place. Patient died at 1110-07-04. ?

## 2021-07-18 NOTE — Progress Notes (Signed)
Patient ID: BOYDE GRIECO, male   DOB: 06-23-1950, 71 y.o.   MRN: [id number]    Progress Note  Patient Name: Randall Hodges Date of Encounter: 07/06/2021  [facility] Cardiologist: [name], MD   Subjective   2/2: VV ECMO initiation, Crescent catheter right IJ 2/7: Tracheostomy 2/13 CT C/A/P: Diffuse ground-glass opacities consistent with atelectasis and edema presumably related to ECMO physiology and respiratory failure.  Underlying moderate interstitial lung disease mildly progressed from prior CT. 2/15: Oxygenator changed, trach changed.  2/16: Transient asystole with cough and adjustment of tracheostomy overnight.  2/17: Atrial flutter with RVR, required emergent DCCV and amiodarone restarted 2/23: Pneumomediastinum with high PEEP, PEEP decreased 2/27: CT chest with worsening lungs, nearly airless with progressive fibrosis, mild pneumomediastinum, moderate-large bilateral effusions.   Remains on vent  FiO2 0.5. VT 60 cc, sedated with Fentanyl.  CXR unchanged bilateral infiltrates. ABG worse.   On amiodarone 30 mg/hr with frequent PVCs, no PVCs currently.  In NSR.   Lasix gtt turned down to 2 mg/hr with chugging.  Creatinine stable.  I/Os positive 1 L, CVP 10.   Still with bilious drainage from NGT, to suction.   He is back on vancomycin/cefepime  ECMO: Speed 3600 rpm Flow 5 L/min pVen -103 DeltaP 27 Sweep 7 PTT 71 (goal 50-80) LDH 173 ABG 7.37/44/49/84% => current 89%  Cardiac Studies: Echo (limited, 1/30): Echo reviewed, there is clear respirophasic variation of the interventricular septum and marked respirophasic variation of E inflow velocity on doppler evaluation of the mitral valve.  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  LV EF 45-50%.   RHC Procedural Findings (on  norepinephrine 6): Hemodynamics (mmHg) RA mean 12 RV 37/12 PA 38/16, mean 27 PCWP mean 11 LV 108/12 AO 96/55 PAPI 1.8 Oxygen saturations: PA 54% AO 94% Cardiac Output (Fick) 5.59  Cardiac Index (Fick) 2.81  PVR 2.8 WU Simultaneous RV/LV tracings were obtained.  Difficult to interpret due to atrial fibrillation.  There was some suggestion of discordance (ventricular interdependence) but not clear.  Inpatient Medications    Scheduled Meds:  aspirin  81 mg Per Tube Daily   baclofen  5 mg Per Tube TID   chlorhexidine gluconate (MEDLINE KIT)  15 mL Mouth Rinse BID   Chlorhexidine Gluconate Cloth  6 each Topical Daily   clonazePAM  1 mg Per Tube TID   colchicine  0.6 mg Per Tube Daily   feeding supplement (PROSource TF)  90 mL Per Tube BID   fiber  1 packet Per Tube TID   free water  200 mL Per Tube Q4H   Gerhardt's butt cream   Topical TID   haloperidol lactate  5 mg Intravenous Once   insulin aspart  0-15 Units Subcutaneous Q4H   mouth rinse  15 mL Mouth Rinse 10 times per day   melatonin  5 mg Per Tube QHS   metoCLOPramide (REGLAN) injection  5 mg Intravenous Q8H   morphine  15 mg Per Tube Q6H   pantoprazole (PROTONIX) IV  40 mg Intravenous Q12H   potassium chloride  40 mEq Per Tube BID   QUEtiapine  25 mg Per Tube QHS   rosuvastatin  10 mg Per Tube Daily   sodium chloride flush  10-40 mL Intracatheter Q12H   sodium chloride flush  10-40 mL Intracatheter Q12H   sodium chloride flush  3  mL Intravenous Q12H   Continuous Infusions:  sodium chloride Stopped (06/01/21 1121)   sodium chloride Stopped (06/10/21 1705)   sodium chloride 10 mL/hr at 06/16/21 1445   albumin human 12.5 g (06/10/21 2101)   albumin human 12.5 g (06/16/21 1227)   amiodarone 30 mg/hr (06-30-2021 0700)   bivalirudin (ANGIOMAX) infusion 0.5 mg/mL (Non-ACS indications) 0.075 mg/kg/hr (06/30/21 0700)   ceFEPime (MAXIPIME) IV Stopped (2021/06/30 6967)   feeding supplement (VITAL 1.5 CAL) 1,000 mL (06/16/21  2258)   fentaNYL infusion INTRAVENOUS 75 mcg/hr (06/30/2021 0700)   furosemide (LASIX) 200 mg in dextrose 5% 100 mL (38m/mL) infusion 2 mg/hr ([id number])   vancomycin Stopped ([id number])   PRN Meds: Place/Maintain arterial line **AND** sodium chloride, sodium chloride, sodium chloride, acetaminophen (TYLENOL) oral liquid 160 mg/5 mL, albumin human, albumin human, diphenhydrAMINE, docusate, fentaNYL (SUBLIMAZE) injection, guaiFENesin, hydrALAZINE, levalbuterol, lip balm, midazolam, ondansetron (ZOFRAN) IV, polyvinyl alcohol, sennosides, sodium chloride flush, white petrolatum   Vital Signs    Vitals:   [id number] 0Mar 14, [id number]  BP:  (!) 102/52  (!) 110/51  Pulse: 84 83 84 82  Resp: 16 11 (!) 22 14  Temp:      TempSrc:      SpO2: (!) 88% (!) 87% (!) 89% (!) 88%  Weight:      Height:        Intake/Output Summary (Last 24 hours) at 3Mar 14, [id number] Last data filed at [id number] Gross per 24 hour  Intake 3847.3 ml  Output 2625 ml  Net 1222.3 ml   Last 3 Weights [id number]/28/2023 06/15/2021  Weight (lbs) 205 lb 7.5 oz 202 lb 13.2 oz 181 lb 3.5 oz  Weight (kg) 93.2 kg 92 kg 82.2 kg      Telemetry    NSR 80s personally reviewed.   Physical Exam   General: Sedated on vent.  Neck: RIJ Crescent cannula, no thyromegaly or thyroid nodule.  Lungs: Decreased BS bilaterally. CV: Nondisplaced PMI.  Heart regular S1/S2, no S3/S4, no murmur.  No peripheral edema.   Abdomen: Soft, nontender, no hepatosplenomegaly, no distention.  Skin: Intact without lesions or rashes.  Neurologic: Sedated, will awaken to follow commands.  Extremities: No clubbing or cyanosis.  HEENT: Normal.    Labs    High Sensitivity Troponin:   Recent Labs  Lab 06/05/21 2208 06/06/21 0037 06/06/21 0532  TROPONINIHS 211* 226* 196*      Chemistry Recent Labs  Lab 06/11/21 0402 06/11/21 0406 06/13/21 1620 06/13/21 1628 06/14/21 0217 06/14/21 0306 06/15/21 0210  06/15/21 0525 06/16/21 0408 06/16/21 0411 06/16/21 1548 06/16/21 1550 [id number] 0March 14, [id number]  NA 146*   < > 143   < > 143   < > 143   < > 144   < > 143   < > 146* 145 146*  K 4.3   < > 4.0   < > 4.0   < > 3.7   < > 3.9   < > 4.1   < > 4.2 4.2 4.1  CL 103   < > 102  --  106   < > 105   < > 107  --  111  --   --  113*  --   CO2 36*   < > 31  --  29   < > 29   < > 27  --  26  --   --  26  --   GLUCOSE  145*   < > 169*  --  139*   < > 139*   < > 142*  --  161*  --   --  142*  --   BUN 48*   < > 45*  --  44*   < > 47*   < > 56*  --  48*  --   --  45*  --   CREATININE 0.96   < > 0.85  --  0.86   < > 0.81   < > 1.14  --  0.96  --   --  0.94  --   CALCIUM 8.3*   < > 8.4*  --  8.2*   < > 7.9*   < > 8.0*  --  8.0*  --   --  8.1*  --   MG 2.2  --  2.4  --   --   --   --   --   --   --   --   --   --   --   --   PROT 4.8*   < >  --   --  4.5*  --  4.7*  --  4.4*  --   --   --   --   --   --   ALBUMIN 3.0*   < >  --   --  2.8*  --  2.5*  --  2.5*  --   --   --   --   --   --   AST 26   < >  --   --  20  --  21  --  20  --   --   --   --   --   --   ALT 60*   < >  --   --  38  --  38  --  33  --   --   --   --   --   --   ALKPHOS 77   < >  --   --  74  --  90  --  88  --   --   --   --   --   --   BILITOT 0.6   < >  --   --  0.8  --  0.7  --  0.7  --   --   --   --   --   --   GFRNONAA >60   < > >60  --  >60   < > >60   < > >60  --  >60  --   --  >60  --   ANIONGAP 7   < > 10  --  8   < > 9   < > 10  --  6  --   --  6  --    < > = values in this interval not displayed.    Lipids  No results for input(s): CHOL, TRIG, HDL, LABVLDL, LDLCALC, CHOLHDL in the last 168 hours.   Hematology Recent Labs  Lab 06/16/21 0408 06/16/21 0411 06/16/21 1548 06/16/21 1550 Jul 15, 2021 0353 2021-07-15 0356 2021-07-15 0637  WBC 9.3  --  11.2*  --   --  11.2*  --   RBC 2.90*  --  2.98*  --   --  2.93*  --   HGB 8.9*   < > 9.0*   < > 8.5* 9.0* 8.8*  HCT  27.7*   < > 28.9*   < > 25.0* 28.6* 26.0*   MCV 95.5  --  97.0  --   --  97.6  --   MCH 30.7  --  30.2  --   --  30.7  --   MCHC 32.1  --  31.1  --   --  31.5  --   RDW 17.2*  --  17.3*  --   --  17.4*  --   PLT 67*  --  79*  --   --  76*  --    < > = values in this interval not displayed.   Thyroid No results for input(s): TSH, FREET4 in the last 168 hours.  BNPNo results for input(s): BNP, PROBNP in the last 168 hours.  DDimer No results for input(s): DDIMER in the last 168 hours.   Radiology    CT ABDOMEN WO CONTRAST  Result Date: 06/15/2021 CLINICAL DATA:  Respiratory illness with nondiagnostic x-ray. EXAM: CT CHEST, ABDOMEN AND PELVIS WITHOUT CONTRAST TECHNIQUE: Multidetector CT imaging of the chest, abdomen and pelvis was performed following the standard protocol without IV contrast. RADIATION DOSE REDUCTION: This exam was performed according to the departmental dose-optimization program which includes automated exposure control, adjustment of the mA and/or kV according to patient size and/or use of iterative reconstruction technique. COMPARISON:  05/29/2021 FINDINGS: CT CHEST FINDINGS Cardiovascular: Normal heart size. Unchanged pericardial fluid and/or thickening. ECMO catheter traverses the right atrium. Right PICC with tip at the lower SVC. Tracheostomy tube in place. Enteric and feeding tubes with enteric tube tip at the stomach and feeding tube tip at the fourth portion duodenum. No acute vascular finding. Prior CABG. Mediastinum/Nodes: Small volume pneumomediastinum. No adenopathy or mass. Lungs/Pleura: Worsening aeration. Definite fibrotic features with traction bronchiectasis diffusely. The lower lobes are completely airless, it is unclear if this is generalized collapse from ECMO, change in ventilator status, or worsening acute lung injury. Increase in bilateral pleural effusion which is moderate to large, measuring up to 3 cm in thickness bilaterally where dependent. Fluid stacks in the upper trachea above the tracheostomy  cuff. Pulmonary interstitial emphysema seen affecting the left upper lobe. 2.6 cm ovoid nodule in the subpleural left lung at approximately the 7-8 intercostal space. Musculoskeletal: No acute finding CT ABDOMEN FINDINGS Hepatobiliary: No focal liver abnormality.No evidence of biliary obstruction or stone. Pancreas: Unremarkable. Spleen: Unremarkable. Adrenals/Urinary Tract: Negative adrenals. No hydronephrosis or ureteral stone. 4 mm right renal calculus. Unremarkable bladder. Stomach/Bowel: No obstruction. No visible bowel inflammation. Tubes as described above. Vascular/Lymphatic: Atheromatous calcification of the aorta. No mass or adenopathy. Other: Body wall edema. Musculoskeletal: No acute abnormalities. Remote compression fractures at L1 and L2. IMPRESSION: 1. Compared to 05/29/2021, worsening aeration with nearly airless lungs. Definite pulmonary fibrotic changes with diffuse traction bronchiectasis. Subpleural interstitial lung disease was seen on a 2019 chest CT but not to the extent noted presently. 2. Mild pneumomediastinum. Findings of barotrauma and pulmonary interstitial emphysema at the left upper lobe. 3. Progressive bilateral pleural effusion which are moderate to large. Subpleural nodule in the posterior left chest at the 7-8 and rib interspace, stable from prior. Has there been thoracentesis? 4. Unchanged pericardial fluid and/or thickening. 5. Unremarkable hardware. Electronically Signed   By: Jorje Guild M.D.   On: 06/15/2021 11:21   CT CHEST WO CONTRAST  Result Date: 06/15/2021 CLINICAL DATA:  Respiratory illness with nondiagnostic x-ray. EXAM: CT CHEST, ABDOMEN AND PELVIS WITHOUT CONTRAST TECHNIQUE: Multidetector CT imaging of the  chest, abdomen and pelvis was performed following the standard protocol without IV contrast. RADIATION DOSE REDUCTION: This exam was performed according to the departmental dose-optimization program which includes automated exposure control, adjustment of  the mA and/or kV according to patient size and/or use of iterative reconstruction technique. COMPARISON:  05/29/2021 FINDINGS: CT CHEST FINDINGS Cardiovascular: Normal heart size. Unchanged pericardial fluid and/or thickening. ECMO catheter traverses the right atrium. Right PICC with tip at the lower SVC. Tracheostomy tube in place. Enteric and feeding tubes with enteric tube tip at the stomach and feeding tube tip at the fourth portion duodenum. No acute vascular finding. Prior CABG. Mediastinum/Nodes: Small volume pneumomediastinum. No adenopathy or mass. Lungs/Pleura: Worsening aeration. Definite fibrotic features with traction bronchiectasis diffusely. The lower lobes are completely airless, it is unclear if this is generalized collapse from ECMO, change in ventilator status, or worsening acute lung injury. Increase in bilateral pleural effusion which is moderate to large, measuring up to 3 cm in thickness bilaterally where dependent. Fluid stacks in the upper trachea above the tracheostomy cuff. Pulmonary interstitial emphysema seen affecting the left upper lobe. 2.6 cm ovoid nodule in the subpleural left lung at approximately the 7-8 intercostal space. Musculoskeletal: No acute finding CT ABDOMEN FINDINGS Hepatobiliary: No focal liver abnormality.No evidence of biliary obstruction or stone. Pancreas: Unremarkable. Spleen: Unremarkable. Adrenals/Urinary Tract: Negative adrenals. No hydronephrosis or ureteral stone. 4 mm right renal calculus. Unremarkable bladder. Stomach/Bowel: No obstruction. No visible bowel inflammation. Tubes as described above. Vascular/Lymphatic: Atheromatous calcification of the aorta. No mass or adenopathy. Other: Body wall edema. Musculoskeletal: No acute abnormalities. Remote compression fractures at L1 and L2. IMPRESSION: 1. Compared to 05/29/2021, worsening aeration with nearly airless lungs. Definite pulmonary fibrotic changes with diffuse traction bronchiectasis. Subpleural  interstitial lung disease was seen on a 2019 chest CT but not to the extent noted presently. 2. Mild pneumomediastinum. Findings of barotrauma and pulmonary interstitial emphysema at the left upper lobe. 3. Progressive bilateral pleural effusion which are moderate to large. Subpleural nodule in the posterior left chest at the 7-8 and rib interspace, stable from prior. Has there been thoracentesis? 4. Unchanged pericardial fluid and/or thickening. 5. Unremarkable hardware. Electronically Signed   By: Jorje Guild M.D.   On: 06/15/2021 11:21   DG CHEST PORT 1 VIEW  Result Date: 06/16/2021 CLINICAL DATA:  Tracheostomy, ECMO. EXAM: PORTABLE CHEST 1 VIEW COMPARISON:  June 14, 2021. FINDINGS: Stable cardiomediastinal silhouette. Tracheostomy, feeding and nasogastric tubes are unchanged. ECMO device is again noted. Right-sided PICC line is unchanged. Stable bilateral lung opacities are noted. Bony thorax is unremarkable. IMPRESSION: Stable support apparatus.  Stable bilateral lung opacities. Electronically Signed   By: Marijo Conception M.D.   On: 06/16/2021 08:13    Cardiac Studies   Cath 05/04/2021 Distal left main [place] 111 bifurcation stenosis with 75% left main, 90% ostial to proximal LAD, and 80-90% ostial circumflex (difficult to assess due to heavy calcification). Severe mid circumflex disease with 70% eccentric mid stenosis and second obtuse marginal containing ostial to proximal greater than 80% stenosis.  (Bifurcation [place] 111 Severe calcification in left main and LAD in particular with diffuse 50% narrowing from proximal to mid vessel and tandem 70% stenoses in the mid LAD. Nondominant right coronary Normal LV function.  EF 55%.  LVEDP normal.    Patient Profile     71 y.o. male with PMH of PVCs presented with chest pain. Cardiac cath by Dr. Tamala Julian on 04/30/2021 showed 75% left main, 90% ost to prox  LAD, 80-90% ost LCx, 70% mid LCx, 80% OM2, 50% prox to mid LAD, 70% mid LAD, EF 55%.  Patient underwent CABG x 3 on 04/25/2021. Post op course complicated afib, treated with amio. CXR showed opacity in bilateral lung, started abx on 1/25 and diuretic. Started on Eliquis due to recurrence of afib.    Assessment & Plan    1. CAD: Admitted with unstable angina, cath with severe left main and proximal LAD/LCx disease (nondominant RCA).  CABG x 3 on 1/18 with LIMA-LAD, SVG-OM, SVG-left PDA.  No s/s angina - Continue ASA 81, statin.  2. 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 RHC 1/30 with equalization of diastolic pressures.  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  Repeated echo 2/2 still showed respirophasic septal variation but not as impressive. CVP 10 today with weight up on Lasix gtt 2 mg/hr.  Lungs still with diffuse bilateral infiltrates on CXR.    - Continue Lasix gtt 2 mg/hr for now, plan to decannulate to comfort care when family arrives.    - With concern for development of post-surgical effusive/constrictive pericarditis, he is on colchicine.  - Initial question of need for surgical intervention on effusive/constrictive pericarditis, but repeat echo not as impressive.   3. 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  Suspect primarily septic/vasodilatory shock. He is now off pressors.  4. Hiccups: Intractable initially, now improved.  - On baclofen and gabapentin.  5. PNA with acute hypoxic respiratory failure: Of note, he does appear to have had some pre-existing ILD from 2019 CT chest (?sarcoidosis). CXR with persistent  bilateral infiltrates, possible mild improvement compared to yesterday.  Have thought most likely aspiration PNA/pneumonitis in setting of intractable hiccups. He has developed ARDS. COVID was negative.  FiO2 0.5 on vent.  Has tracheostomy. CT chest 2/9 with persistent lung infiltrates.  Completed vancomycin and meropenem. Suspect ongoing aspiration with TFs noted in trach on 2/16, now with NGT to suction and post-pyloric feeding tube.  LDH stable today. Stable ECMO circuit.  FiO2 0.5. Abx restarted 2/25 (vancomycin/cefepime). Sedated on Fentanyl, Vt only 65 cc.  CT chest 2/27 showed worsening lungs, nearly airless with progressive fibrosis and mod-large pleural effusions.  - Pulmonary worsening despite 25 days now of ECMO support with progressive fibrosis. We are not making progress at this point.  After discussion with family yesterday, plan will be decannulation of ECMO to comfort care later today.   - Vent and sedation per CCM.  - Completed steroids.  - Back on amiodarone but suspect not amiodarone lung toxicity.   - Abx restarted (vanc/cefepime) 2/25. Cultures NGTD.   6. Atypical atrial flutter/PVCs:  DCCV 2/17.  NSR this morning.   - Continue IV amiodarone 30 mg/hr.  - Bivalirudin gtt, goal PTT 50-80.  7. AKI: Creatinine stable.   8. Elevated LFTs: Suspect shock liver, follow CMET.  LFTs have trended down.  9. Anemia: Hgb 9, transfuse < 8.     10. Hypernatremia: Na 144 today.  - Continue free water 200 q4.  11. HTN: BP rises with sedation wean.  - prn hydralazine for now.  12. Bradycardia/asystole: Vagally mediated with cough.  Resolves rapidly with resolution  of cough.  - Atropine at bedside.  13. FEN: TFs restarted with post-pyloric tube and also NGT to suction to prevent aspiration.   - Continue Reglan.   14. Physical deconditioning, severe - Mobilize as able.  15. GERD: Severe.  - continue PPI and aspiration precautions 16. Thrombocytopenia: Due to critical illness.  Plts 76K today.    See above for plan going forwards.  Plan ECMO decannulation/comfort care at this point, will occur later today after family arrives.    CRITICAL CARE Performed by: Loralie Champagne  Total critical care time: 45 minutes  Critical care time was exclusive of separately billable procedures and treating other patients.  Critical care was necessary to treat or prevent imminent or life-threatening deterioration.  Critical care was time spent personally by me on the following activities: development of treatment plan with patient and/or surrogate as well as nursing, discussions with consultants, evaluation of patient's response to treatment, examination of patient, obtaining history from patient or surrogate, ordering and performing treatments and interventions, ordering and review of laboratory studies, ordering and review of radiographic studies, pulse oximetry and re-evaluation of patient's condition.  Loralie Champagne MD 2021-07-08 7:49 AM

## 2021-07-18 DEATH — deceased

## 2022-02-12 LAB — POCT I-STAT EG7

## 2022-02-16 LAB — POCT I-STAT 7, (LYTES, BLD GAS, ICA,H+H)
Acid-Base Excess: 9 mmol/L — ABNORMAL HIGH (ref 0.0–2.0)
Acid-Base Excess: 9 mmol/L — ABNORMAL HIGH (ref 0.0–2.0)
Bicarbonate: 33.7 mmol/L — ABNORMAL HIGH (ref 20.0–28.0)
Bicarbonate: 33.9 mmol/L — ABNORMAL HIGH (ref 20.0–28.0)
Calcium, Ion: 1.15 mmol/L (ref 1.15–1.40)
Calcium, Ion: 1.15 mmol/L (ref 1.15–1.40)
HCT: 27 % — ABNORMAL LOW (ref 39.0–52.0)
HCT: 28 % — ABNORMAL LOW (ref 39.0–52.0)
Hemoglobin: 9.2 g/dL — ABNORMAL LOW (ref 13.0–17.0)
Hemoglobin: 9.5 g/dL — ABNORMAL LOW (ref 13.0–17.0)
O2 Saturation: 93 %
O2 Saturation: 95 %
Patient temperature: 97.2
Patient temperature: 97.8
Potassium: 3.7 mmol/L (ref 3.5–5.1)
Potassium: 3.9 mmol/L (ref 3.5–5.1)
Sodium: 129 mmol/L — ABNORMAL LOW (ref 135–145)
Sodium: 131 mmol/L — ABNORMAL LOW (ref 135–145)
TCO2: 35 mmol/L — ABNORMAL HIGH (ref 22–32)
TCO2: 35 mmol/L — ABNORMAL HIGH (ref 22–32)
pCO2 arterial: 48.5 mmHg — ABNORMAL HIGH (ref 32.0–48.0)
pCO2 arterial: 49.6 mmHg — ABNORMAL HIGH (ref 32.0–48.0)
pH, Arterial: 7.443 (ref 7.350–7.450)
pH, Arterial: 7.45 (ref 7.350–7.450)
pO2, Arterial: 64 mmHg — ABNORMAL LOW (ref 83.0–108.0)
pO2, Arterial: 75 mmHg — ABNORMAL LOW (ref 83.0–108.0)

## 2022-03-25 IMAGING — DX DG CHEST 1V PORT
1 series · 1 of 1 positions shown · non-contrast
Comparison: Radiographs earlier the same date, 05/25/2021 and
05/24/2021. CT 06/02/2017.

CLINICAL DATA: Status post tracheostomy.  On ECMO.

EXAM:
PORTABLE CHEST 1 VIEW

[chest ap]
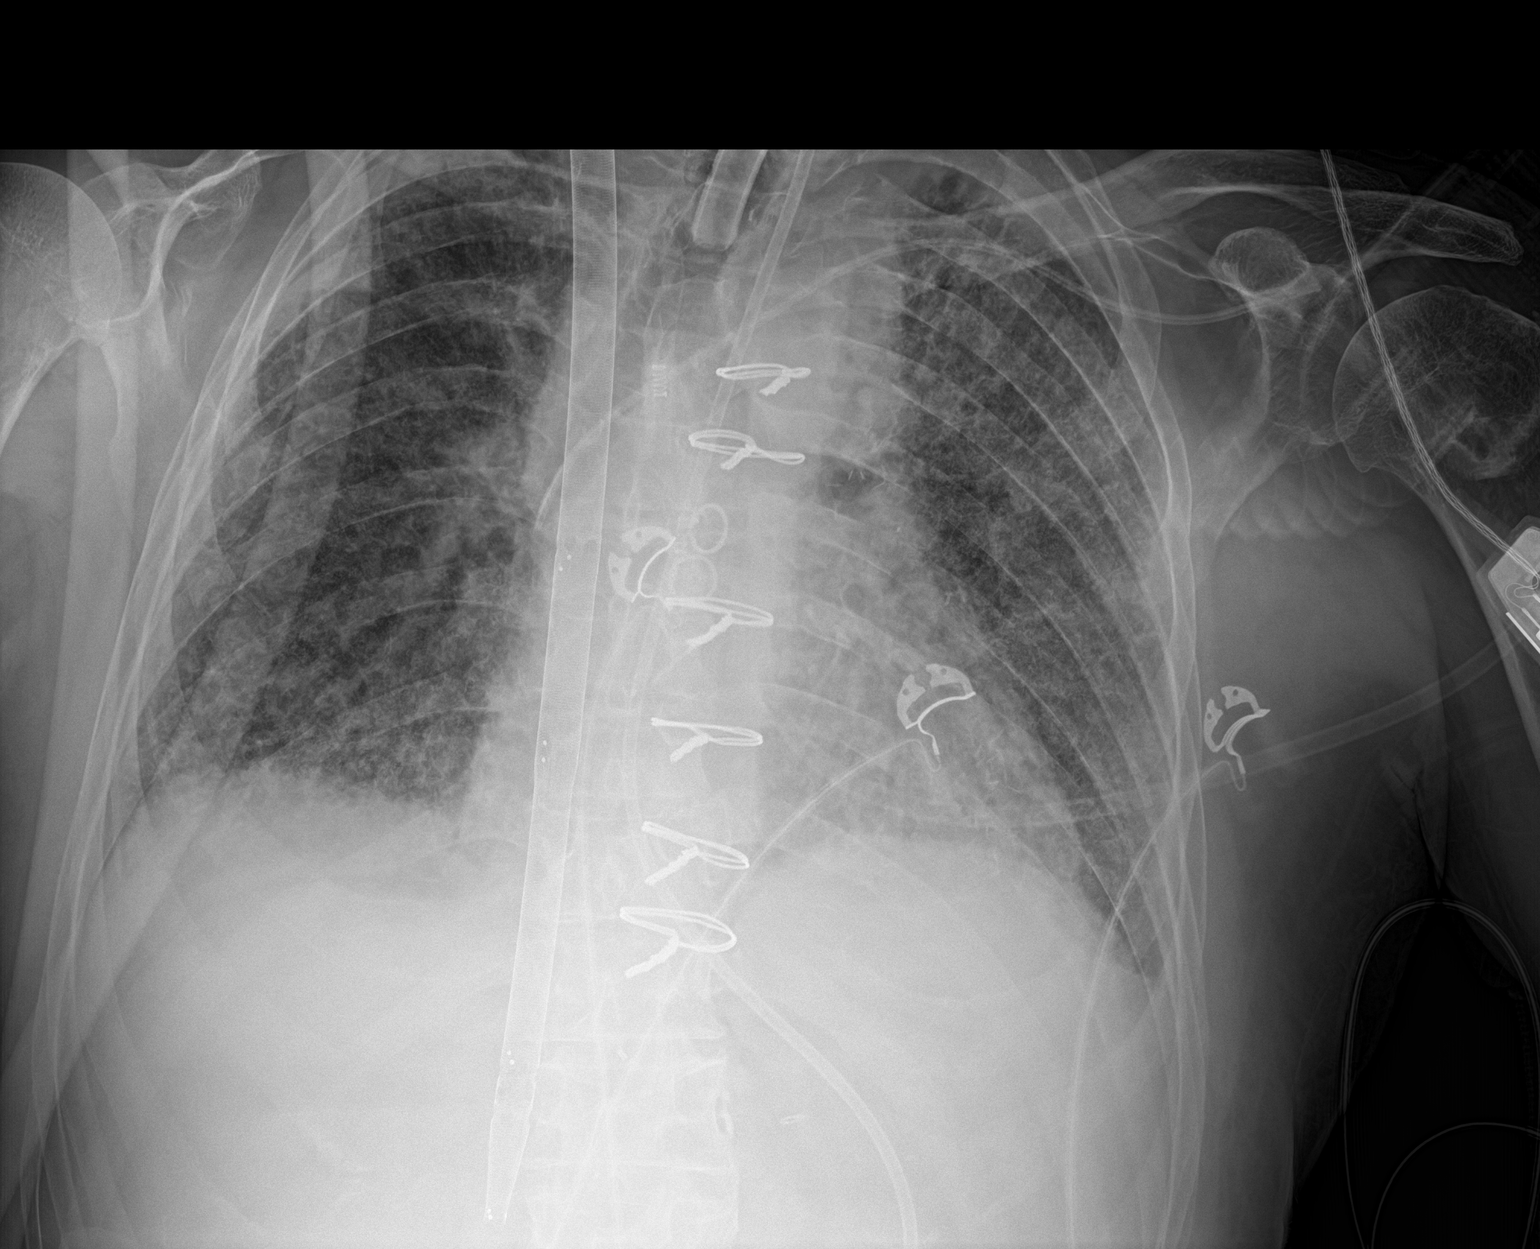

[1 of 1 positions shown; findings below may reference images not displayed]

FINDINGS: 4443 hours. Interval tracheostomy. The tracheostomy is well
positioned. Feeding tube, left subclavian central venous catheter
and ECMO apparatus appear unchanged.

The heart size and mediastinal contours are stable status post
median sternotomy and CABG. Further slight improvement in pulmonary
edema with chronic biapical scarring. No evidence of pneumothorax or
significant pleural effusion.

There is a mildly displaced fracture of the right 2nd rib which
appears unchanged. No new fractures are identified.
IMPRESSION: 1. Satisfactory position of the tracheostomy. No pneumothorax.
Additional components of the support system appear unchanged.
2. Further slight improvement in pulmonary edema.

## 2022-03-26 IMAGING — DX DG CHEST 1V PORT
1 series · 1 of 1 positions shown · non-contrast
Comparison: May 26, 2021.

CLINICAL DATA: Tracheostomy.  ECMO.

EXAM:
PORTABLE CHEST 1 VIEW

[chest]
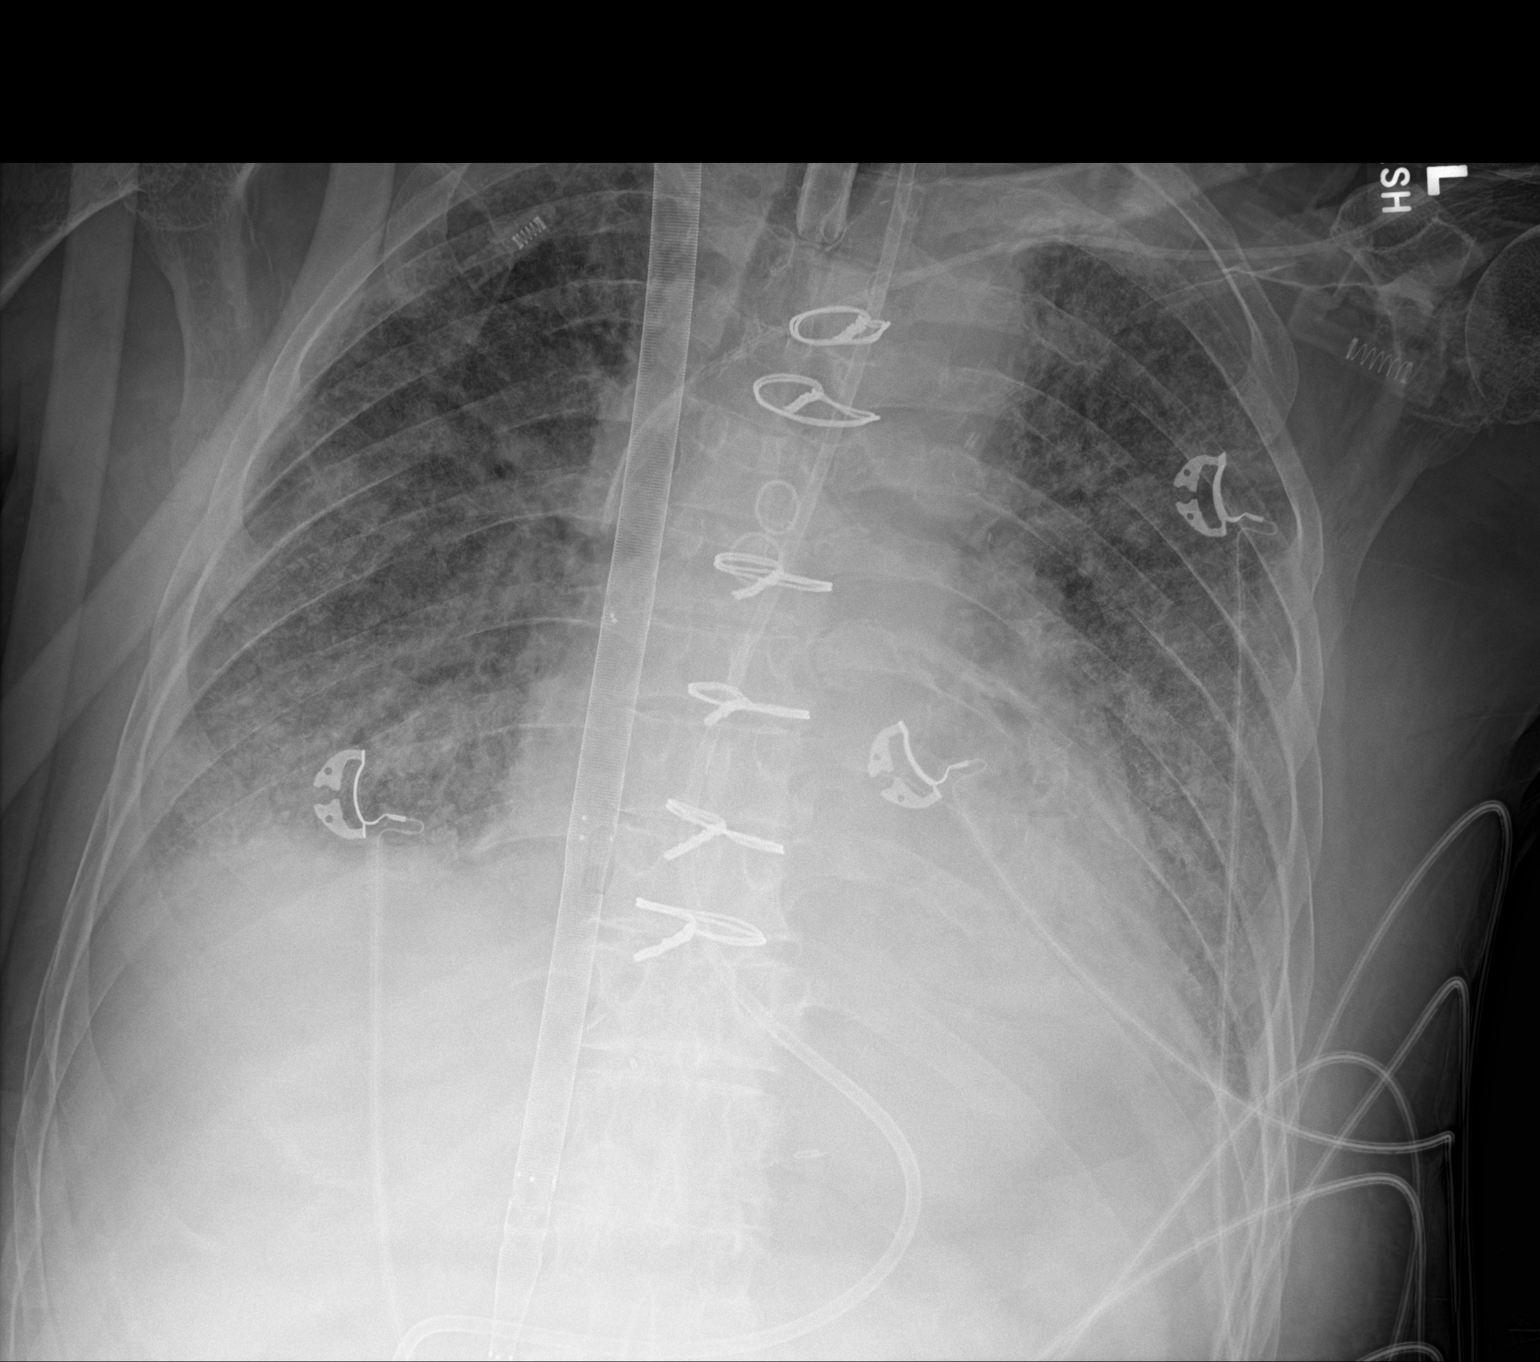

[1 of 1 positions shown; findings below may reference images not displayed]

FINDINGS: Stable cardiomediastinal silhouette. Status post coronary bypass
graft. Tracheostomy is in grossly good position. Feeding tube is
seen entering stomach. ECMO device is noted. Left subclavian
catheter is unchanged. Stable diffuse lung opacities are noted
concerning for edema. Small pleural effusions may be present. No
pneumothorax is noted. Mildly displaced right second rib fracture is
noted.
IMPRESSION: Stable support apparatus. Stable bilateral opacities as described
above.

## 2022-03-27 IMAGING — DX DG CHEST 1V PORT
1 series · 1 of 1 positions shown · non-contrast
Comparison: AP chest 08/24/2021

CLINICAL DATA: Trach present.  On ECMO.

EXAM:
PORTABLE CHEST 1 VIEW

[chest ap]
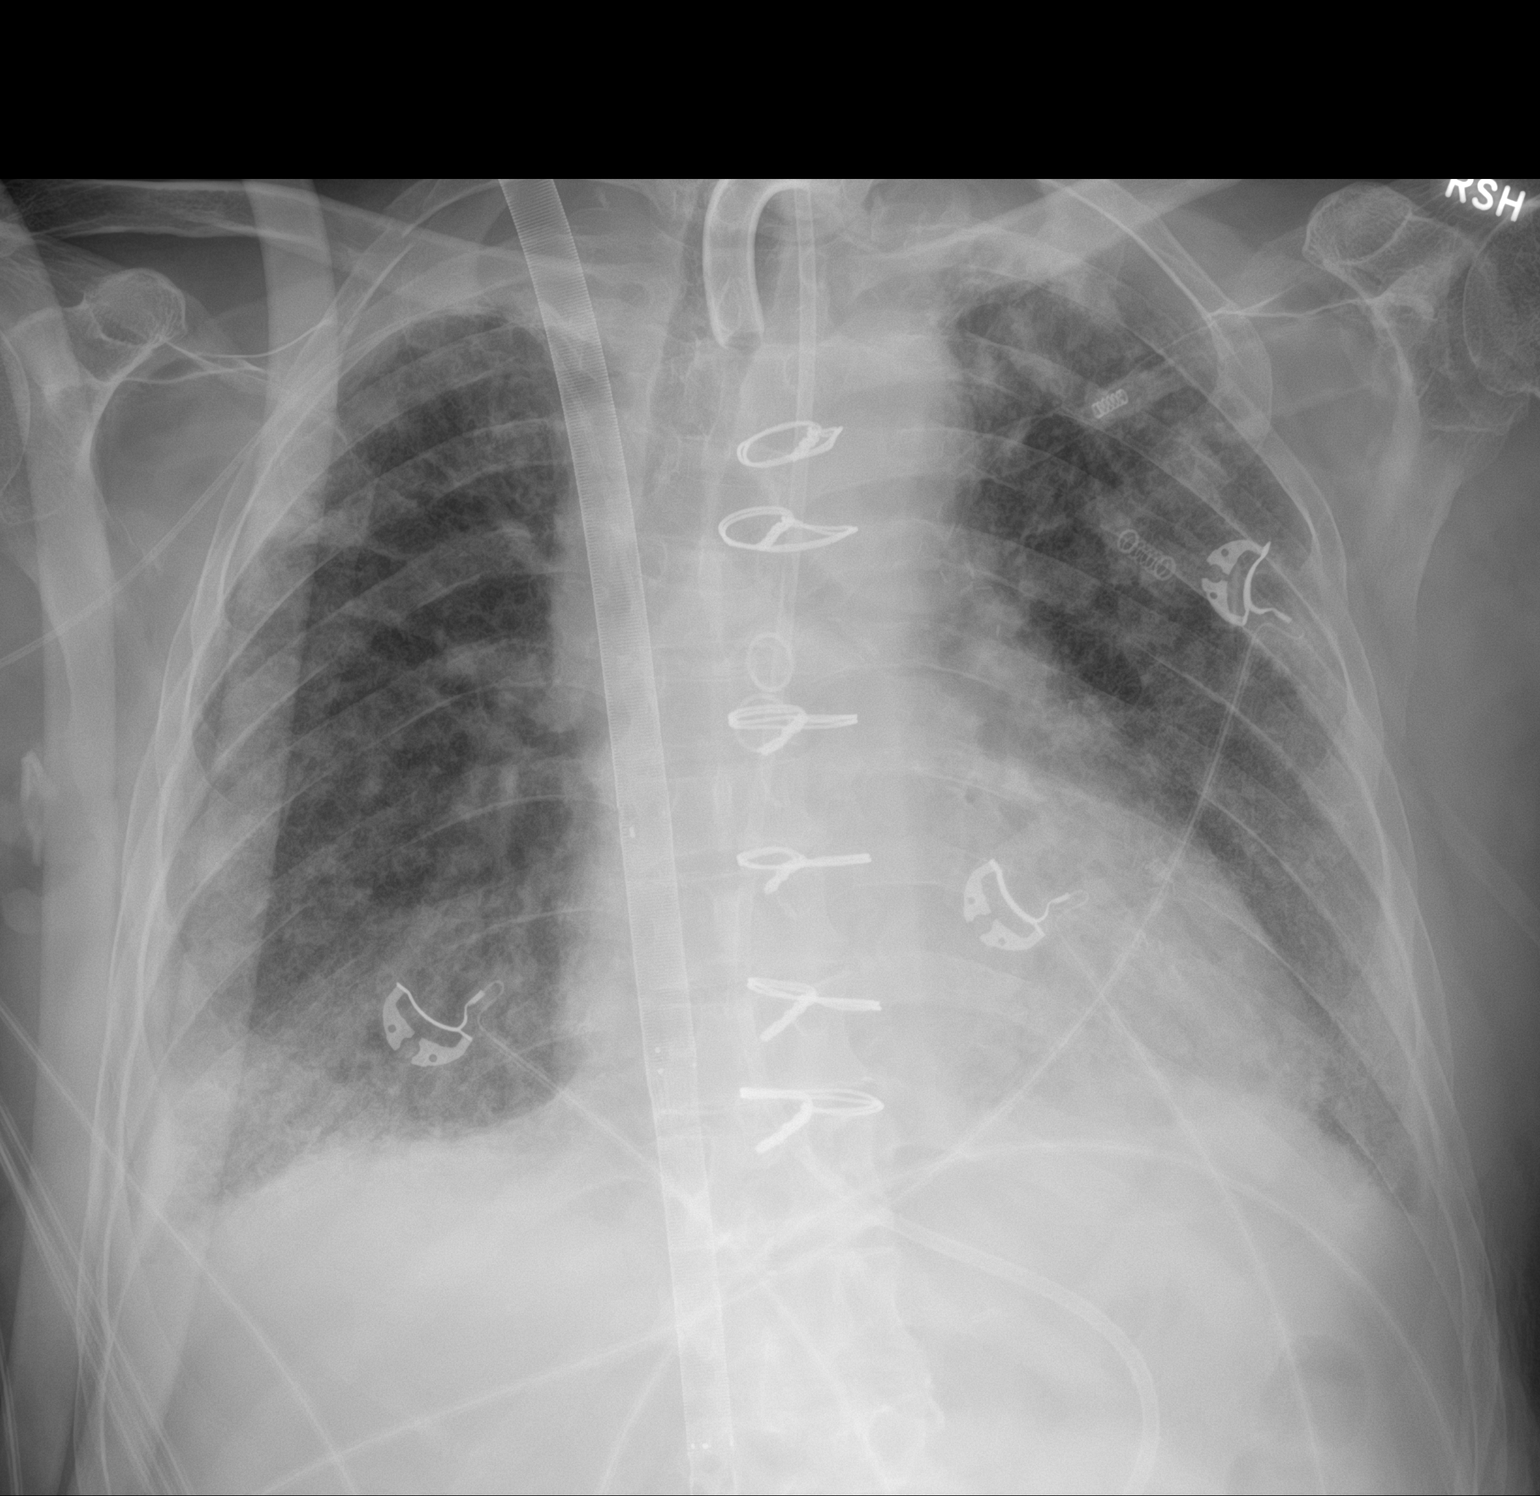

[1 of 1 positions shown; findings below may reference images not displayed]

FINDINGS: Status post median sternotomy and CABG. Tracheostomy tube overlies
the midline trachea. Enteric tube descends below the diaphragm with
the tip excluded by collimation inferiorly. ECMO device is noted.
Interval removal of left subclavian central venous catheter.

Moderate bilateral interstitial thickening with mild bibasilar
heterogeneous airspace opacities. Unchanged minimally improved
aeration of the bilateral lungs. Again small bilateral pleural
effusions may be present. Mildly displaced acute lateral right
second rib fracture is seen.
IMPRESSION: :
IMPRESSION: 1. Interval removal of left subclavian central venous catheter.
Otherwise support staple apparatus.
2. Moderate bilateral interstitial thickening and mild bibasilar
heterogeneous airspace opacities with small pleural effusions.

## 2022-03-28 IMAGING — DX DG CHEST 1V PORT
1 series · 1 of 1 positions shown · non-contrast
Comparison: May 28, 2021.

CLINICAL DATA: Tracheostomy, on ECMO.

EXAM:
PORTABLE CHEST 1 VIEW

[chest ap]
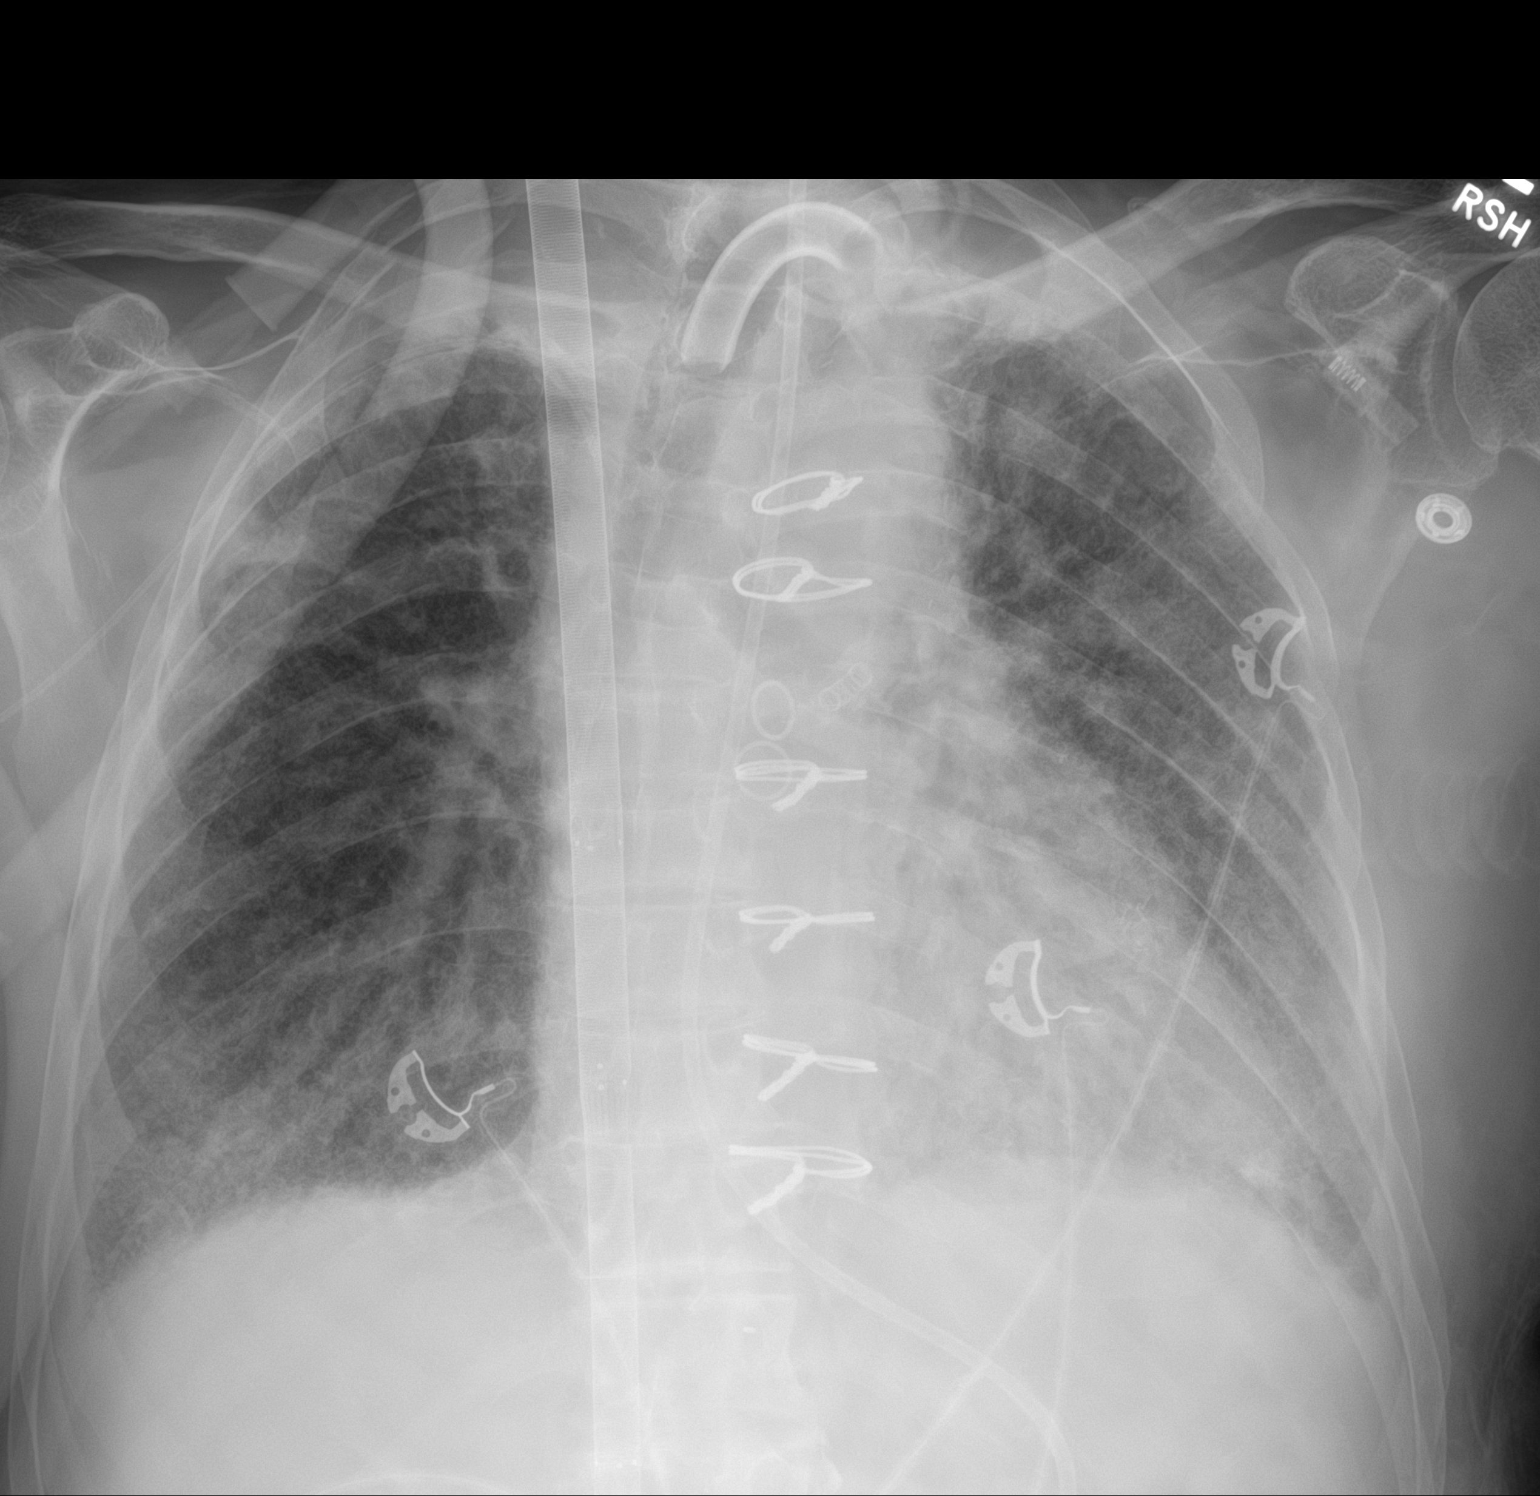

[1 of 1 positions shown; findings below may reference images not displayed]

FINDINGS: Stable cardiomediastinal silhouette. Tracheostomy and feeding tube
are in good position. ECMO device is again noted. Status post
coronary bypass graft. Stable bilateral lung opacities are noted
which may represent atelectasis or edema. Small pleural effusions
may be present. Bony thorax is unremarkable.
IMPRESSION: Stable support apparatus.  Stable bibasilar opacities.

## 2022-03-28 IMAGING — CT CT CHEST-ABD-PELV W/O CM
2 of 5 series · 14 of 46 positions shown, 16 images · non-contrast
Comparison: Chest CT 06/02/2017

CLINICAL DATA: Eval for pulm fibrosis/cpEval for pulm fibrosis



[Series 4: cap w/o 2.0 mm st · axial · non-contrast · 0.88mm/px · z∈[+924,+1498]mm · 11 of 329 slices shown, 13 images]
[im 21/329  soft-tissue]
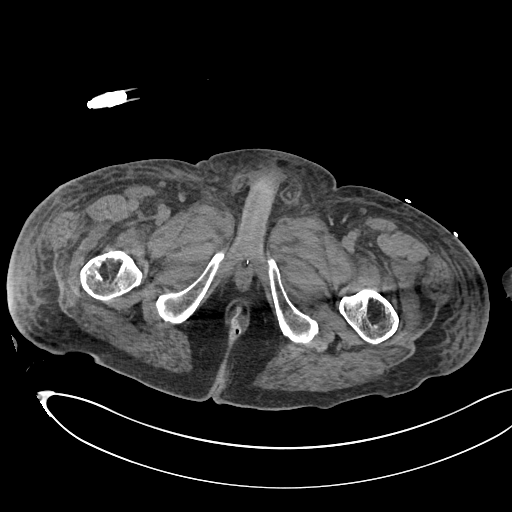
[im 21/329  bone]
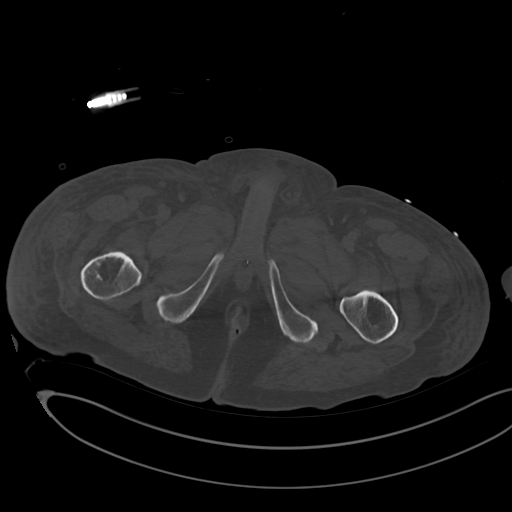
[im 62/329  soft-tissue]
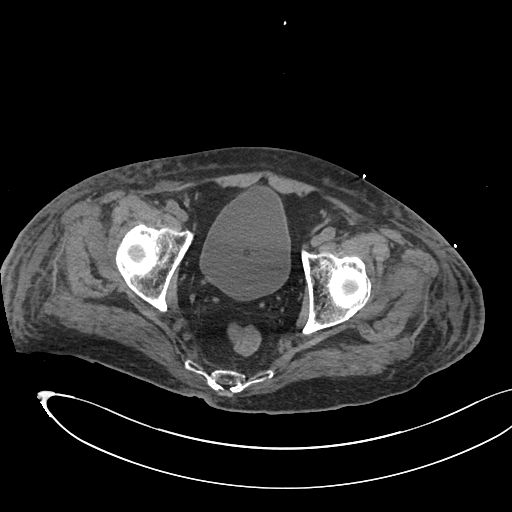
[im 83/329  soft-tissue]
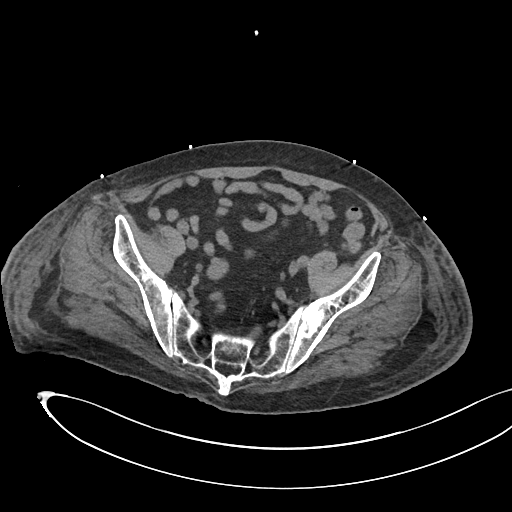
[im 103/329  soft-tissue]
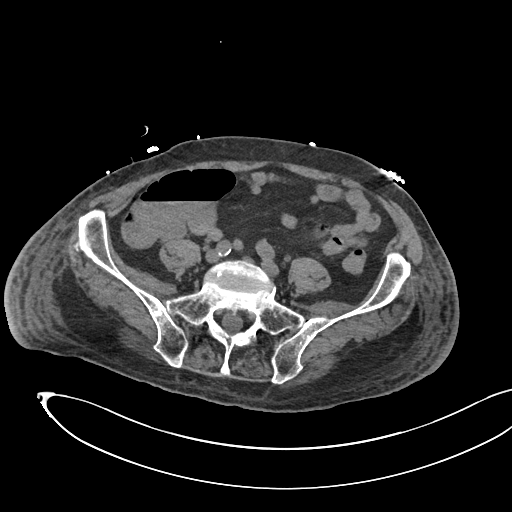
[im 144/329  soft-tissue]
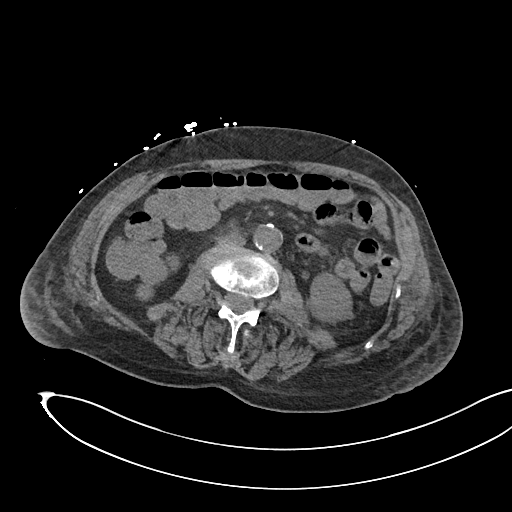
[im 165/329  soft-tissue]
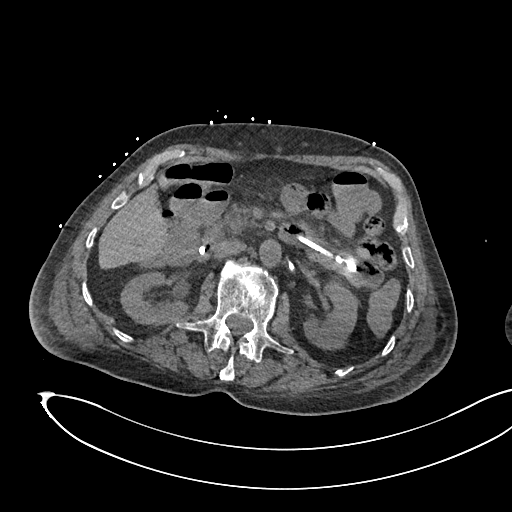
[im 185/329  soft-tissue]
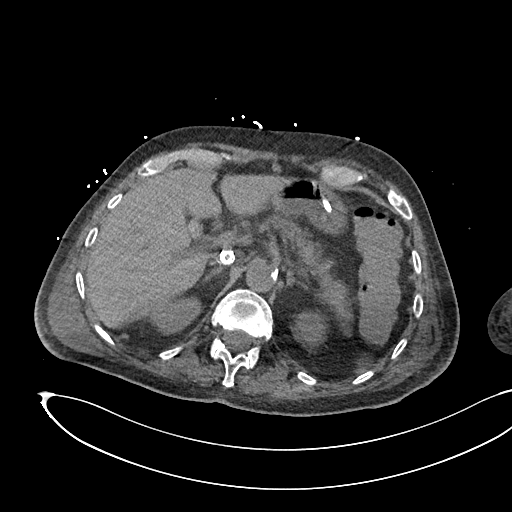
[im 226/329  soft-tissue]
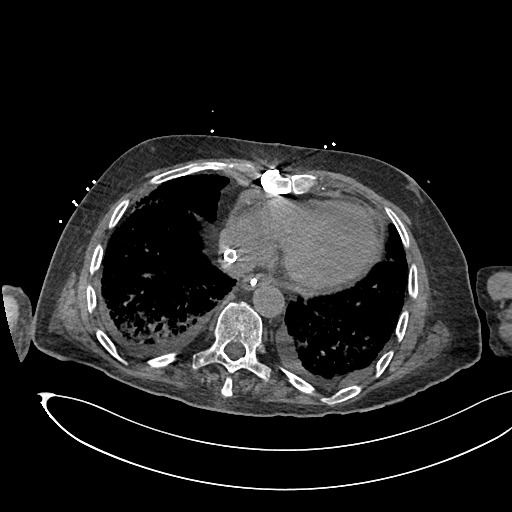
[im 247/329  soft-tissue]
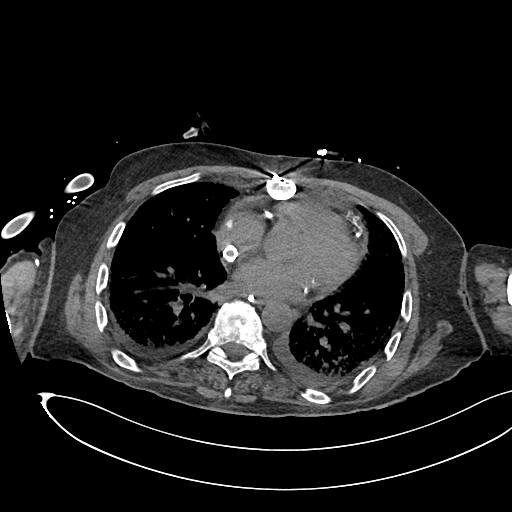
[im 247/329  bone]
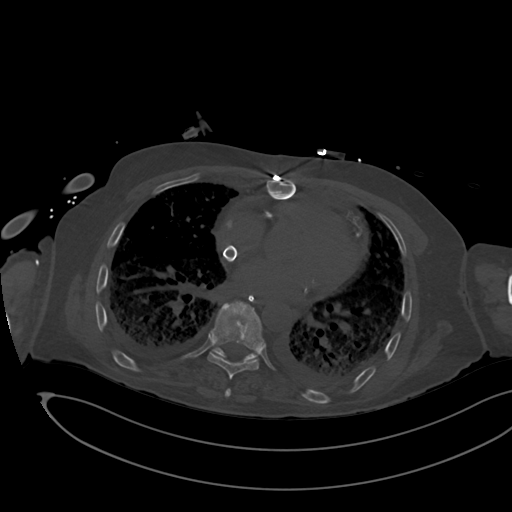
[im 267/329  soft-tissue]
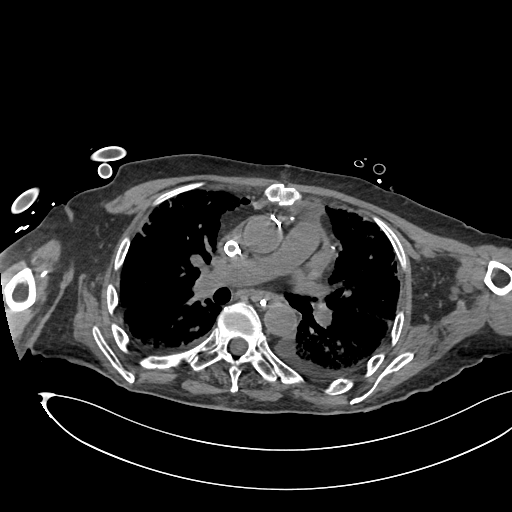
[im 308/329  soft-tissue]
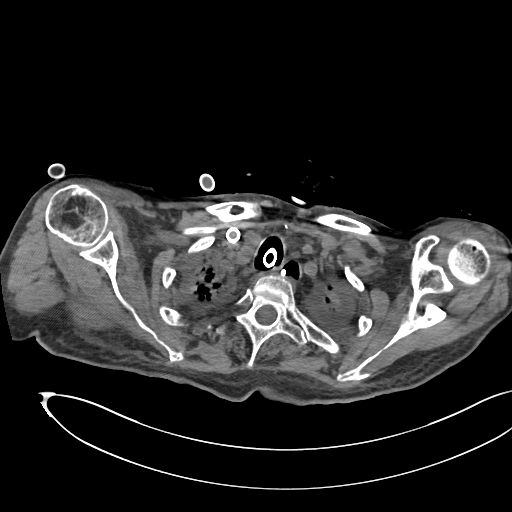

[Series 6: cap w/o 3.0 mm st cor · coronal · non-contrast · 0.90mm/px · 3 of 116 slices shown]
[im 39/116  soft-tissue]
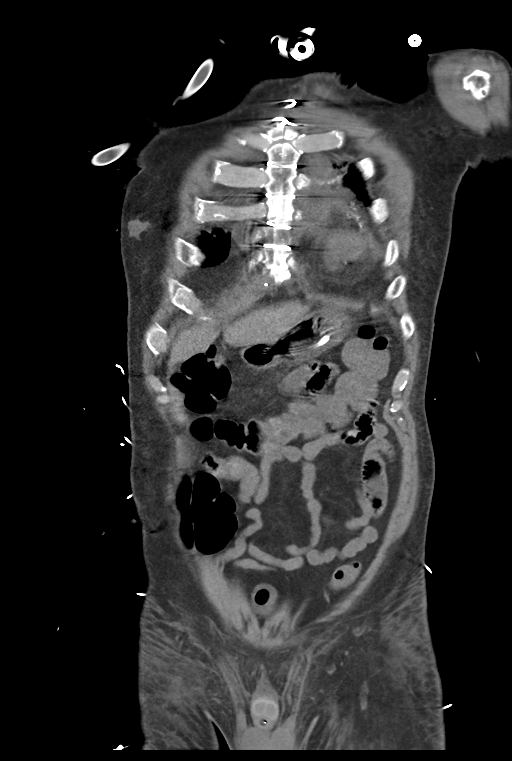
[im 52/116  soft-tissue]
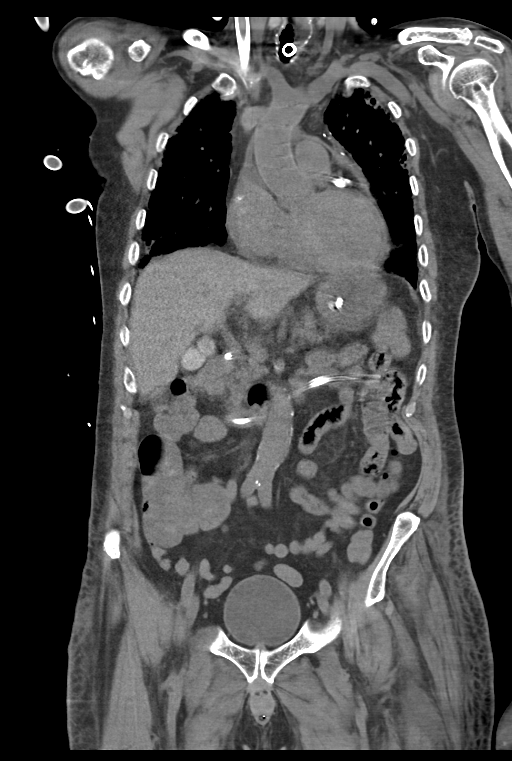
[im 64/116  soft-tissue]
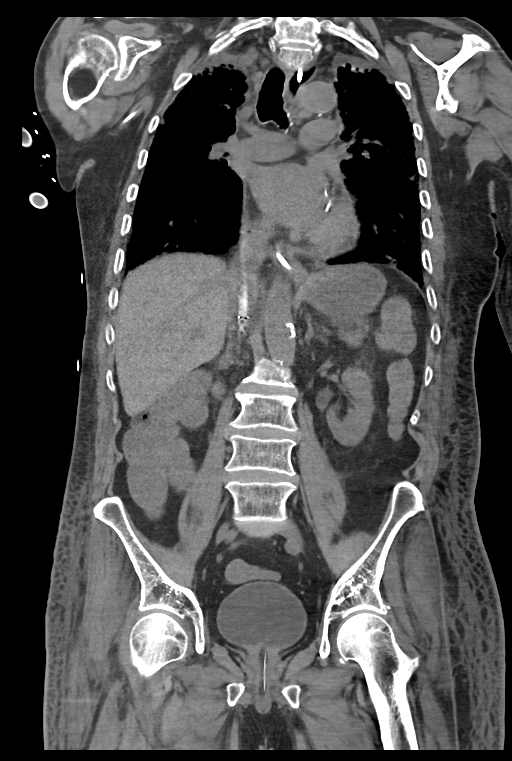

[14 of 46 positions shown; findings below may reference images not displayed]

FINDINGS: CT CHEST FINDINGS

Cardiovascular: Post CABG. ECMO cannulation catheter extends through
the RIGHT atrium into the IVC. RIGHT PICC line with tip in the
distal SVC. Trace pericardial effusion

Mediastinum/Nodes: Tracheostomy tube in good position mid trachea.
Feeding tube extends to the esophagus. No mediastinal adenopathy.
No axillary supraclavicular adenopathy

Lungs/Pleura: diffuse ground-glass opacities in lungs consistent
with atelectasis and mild edema. Small bilateral pleural effusions.
Underlying subpleural interstitial lung disease is difficult to
evaluate the background of diffuse ground-glass densities.
Interstitial lung disease moderately progressed from 8102.

Musculoskeletal: Midline sternotomy

CT ABDOMEN AND PELVIS FINDINGS

Hepatobiliary: Normal liver. High-density fluid in the gallbladder
related to prior cardiac catheterization (vicarious excretion of
contrast).

Pancreas: Pancreas is normal. No ductal dilatation. No pancreatic
inflammation.

Spleen: Normal spleen

Adrenals/urinary tract: Adrenal glands and kidneys are normal. The
ureters and bladder normal. Foley catheter in place

Stomach/Bowel: Feeding tube extends through the stomach into the
fourth portion the duodenum. No small bowel dilatation or
obstruction. Fluid stool in the colon. Rectum normal. Rectal tube in
place.

Vascular/Lymphatic: Abdominal aorta is normal caliber with
atherosclerotic calcification. There is no retroperitoneal or
periportal lymphadenopathy. No pelvic lymphadenopathy.

Reproductive: Unremarkable

Other: No free fluid.

Musculoskeletal: No aggressive osseous lesion.
IMPRESSION: Chest Impression:

1. Support apparatus appears in good position. ECMO cannulation
extends through the RIGHT atrium.
2. Diffuse ground-glass opacities consistent with atelectasis and
edema presumably related to ECMO physiology and respiratory failure.
3. Underlying moderate interstitial lung disease mildly progressed
from prior CT.
4. Bilateral pleural effusions.
5. Post CABG without complication.

Abdomen / Pelvis Impression:

1. Feeding tube extends in to the fourth portion duodenum.
2. No acute findings abdomen pelvis.
